# Patient Record
Sex: Male | Born: 1965 | Race: White | Hispanic: No | Marital: Married | Smoking: Current every day smoker
Health system: Southern US, Community
[De-identification: ages and names within clinical notes are randomized; demographics above are authoritative.]

## PROBLEM LIST (undated history)

## (undated) DIAGNOSIS — M549 Dorsalgia, unspecified: Secondary | ICD-10-CM

## (undated) DIAGNOSIS — K648 Other hemorrhoids: Secondary | ICD-10-CM

## (undated) DIAGNOSIS — Z973 Presence of spectacles and contact lenses: Secondary | ICD-10-CM

## (undated) DIAGNOSIS — I493 Ventricular premature depolarization: Secondary | ICD-10-CM

## (undated) DIAGNOSIS — D6859 Other primary thrombophilia: Secondary | ICD-10-CM

## (undated) DIAGNOSIS — D500 Iron deficiency anemia secondary to blood loss (chronic): Secondary | ICD-10-CM

## (undated) DIAGNOSIS — F1021 Alcohol dependence, in remission: Secondary | ICD-10-CM

## (undated) DIAGNOSIS — G629 Polyneuropathy, unspecified: Secondary | ICD-10-CM

## (undated) DIAGNOSIS — E785 Hyperlipidemia, unspecified: Secondary | ICD-10-CM

## (undated) DIAGNOSIS — I499 Cardiac arrhythmia, unspecified: Secondary | ICD-10-CM

## (undated) DIAGNOSIS — F418 Other specified anxiety disorders: Secondary | ICD-10-CM

## (undated) DIAGNOSIS — Z8679 Personal history of other diseases of the circulatory system: Secondary | ICD-10-CM

## (undated) DIAGNOSIS — Z86718 Personal history of other venous thrombosis and embolism: Secondary | ICD-10-CM

## (undated) DIAGNOSIS — Z86010 Personal history of colonic polyps: Secondary | ICD-10-CM

## (undated) DIAGNOSIS — J410 Simple chronic bronchitis: Secondary | ICD-10-CM

## (undated) DIAGNOSIS — G8929 Other chronic pain: Secondary | ICD-10-CM

## (undated) DIAGNOSIS — Z95828 Presence of other vascular implants and grafts: Secondary | ICD-10-CM

## (undated) DIAGNOSIS — M1990 Unspecified osteoarthritis, unspecified site: Secondary | ICD-10-CM

## (undated) DIAGNOSIS — N529 Male erectile dysfunction, unspecified: Secondary | ICD-10-CM

## (undated) DIAGNOSIS — Z860101 Personal history of adenomatous and serrated colon polyps: Secondary | ICD-10-CM

## (undated) DIAGNOSIS — K219 Gastro-esophageal reflux disease without esophagitis: Secondary | ICD-10-CM

## (undated) DIAGNOSIS — F17200 Nicotine dependence, unspecified, uncomplicated: Secondary | ICD-10-CM

## (undated) DIAGNOSIS — I739 Peripheral vascular disease, unspecified: Secondary | ICD-10-CM

## (undated) DIAGNOSIS — R760 Raised antibody titer: Secondary | ICD-10-CM

## (undated) DIAGNOSIS — Z7901 Long term (current) use of anticoagulants: Secondary | ICD-10-CM

## (undated) DIAGNOSIS — K449 Diaphragmatic hernia without obstruction or gangrene: Secondary | ICD-10-CM

## (undated) HISTORY — DX: Raised antibody titer: R76.0

## (undated) HISTORY — DX: Ventricular premature depolarization: I49.3

## (undated) HISTORY — DX: Other primary thrombophilia: D68.59

## (undated) HISTORY — DX: Peripheral vascular disease, unspecified: I73.9

## (undated) HISTORY — DX: Hyperlipidemia, unspecified: E78.5

## (undated) HISTORY — PX: TRANSTHORACIC ECHOCARDIOGRAM: SHX275

## (undated) HISTORY — PX: CARDIOVASCULAR STRESS TEST: SHX262

## (undated) HISTORY — DX: Other specified anxiety disorders: F41.8

## (undated) HISTORY — DX: Other hemorrhoids: K64.8

## (undated) HISTORY — PX: HEMORRHOID SURGERY: SHX153

## (undated) HISTORY — DX: Nicotine dependence, unspecified, uncomplicated: F17.200

---
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
## 1998-10-19 ENCOUNTER — Encounter: Payer: Self-pay | Admitting: *Deleted

## 1998-10-19 ENCOUNTER — Emergency Department (HOSPITAL_COMMUNITY): Admission: EM | Admit: 1998-10-19 | Discharge: 1998-10-19 | Payer: Self-pay | Admitting: *Deleted

## 1999-08-05 ENCOUNTER — Encounter: Payer: Self-pay | Admitting: Emergency Medicine

## 1999-08-05 ENCOUNTER — Inpatient Hospital Stay (HOSPITAL_COMMUNITY): Admission: EM | Admit: 1999-08-05 | Discharge: 1999-08-06 | Payer: Self-pay

## 2001-09-12 ENCOUNTER — Emergency Department (HOSPITAL_COMMUNITY): Admission: EM | Admit: 2001-09-12 | Discharge: 2001-09-12 | Payer: Self-pay

## 2010-06-28 ENCOUNTER — Other Ambulatory Visit: Payer: Self-pay | Admitting: Internal Medicine

## 2010-06-28 DIAGNOSIS — M5412 Radiculopathy, cervical region: Secondary | ICD-10-CM

## 2010-06-29 ENCOUNTER — Ambulatory Visit
Admission: RE | Admit: 2010-06-29 | Discharge: 2010-06-29 | Disposition: A | Discharge status: Home / Self Care | Payer: Managed Care, Other (non HMO) | Source: Ambulatory Visit | Attending: Internal Medicine | Admitting: Internal Medicine

## 2010-06-29 ENCOUNTER — Other Ambulatory Visit: Payer: Self-pay | Admitting: Internal Medicine

## 2010-06-29 DIAGNOSIS — Z77018 Contact with and (suspected) exposure to other hazardous metals: Secondary | ICD-10-CM

## 2010-06-29 DIAGNOSIS — M5412 Radiculopathy, cervical region: Secondary | ICD-10-CM

## 2010-07-15 ENCOUNTER — Encounter (HOSPITAL_COMMUNITY)
Admission: RE | Admit: 2010-07-15 | Discharge: 2010-07-15 | Disposition: A | Discharge status: Home / Self Care | Payer: Managed Care, Other (non HMO) | Source: Ambulatory Visit | Attending: Neurosurgery | Admitting: Neurosurgery

## 2010-07-15 DIAGNOSIS — Z01812 Encounter for preprocedural laboratory examination: Secondary | ICD-10-CM | POA: Insufficient documentation

## 2010-07-15 LAB — CBC
HCT: 39.4 % (ref 39.0–52.0)
Hemoglobin: 13.5 g/dL (ref 13.0–17.0)
MCH: 32 pg (ref 26.0–34.0)
MCHC: 34.3 g/dL (ref 30.0–36.0)
MCV: 93.4 fL (ref 78.0–100.0)
Platelets: 304 10*3/uL (ref 150–400)
RBC: 4.22 MIL/uL (ref 4.22–5.81)
RDW: 13.4 % (ref 11.5–15.5)
WBC: 24.7 10*3/uL — ABNORMAL HIGH (ref 4.0–10.5)

## 2010-07-15 LAB — BASIC METABOLIC PANEL
CO2: 25 mEq/L (ref 19–32)
GFR calc Af Amer: >60 mL/min (ref >60)
GFR calc non Af Amer: >60 mL/min (ref >60)
Glucose, Bld: 83 mg/dL (ref 70–99)
Potassium: 4.1 mEq/L (ref 3.5–5.1)
Sodium: 141 mEq/L (ref 135–145)

## 2010-07-15 LAB — URINALYSIS, ROUTINE W REFLEX MICROSCOPIC
Hgb urine dipstick: NEGATIVE
Protein, ur: NEGATIVE mg/dL
Urobilinogen, UA: 1 mg/dL (ref 0.0–1.0)

## 2010-07-15 LAB — PROTIME-INR: INR: 1.06 (ref 0.00–1.49)

## 2010-07-15 LAB — SURGICAL PCR SCREEN: MRSA, PCR: NEGATIVE

## 2010-07-21 ENCOUNTER — Ambulatory Visit (HOSPITAL_COMMUNITY)
Admission: RE | Admit: 2010-07-21 | Discharge: 2010-07-21 | Disposition: A | Discharge status: Home / Self Care | Payer: Managed Care, Other (non HMO) | Source: Ambulatory Visit | Attending: Neurosurgery | Admitting: Neurosurgery

## 2010-07-21 ENCOUNTER — Observation Stay (HOSPITAL_COMMUNITY): Payer: Managed Care, Other (non HMO)

## 2010-07-21 ENCOUNTER — Observation Stay (HOSPITAL_COMMUNITY)
Admission: RE | Admit: 2010-07-21 | Discharge: 2010-07-21 | Disposition: A | Discharge status: Home / Self Care | Payer: Managed Care, Other (non HMO) | Source: Ambulatory Visit | Attending: Neurosurgery | Admitting: Neurosurgery

## 2010-07-21 ENCOUNTER — Other Ambulatory Visit (HOSPITAL_COMMUNITY): Payer: Self-pay | Admitting: Neurosurgery

## 2010-07-21 DIAGNOSIS — F17200 Nicotine dependence, unspecified, uncomplicated: Secondary | ICD-10-CM | POA: Insufficient documentation

## 2010-07-21 DIAGNOSIS — D72829 Elevated white blood cell count, unspecified: Secondary | ICD-10-CM

## 2010-07-21 DIAGNOSIS — M5020 Other cervical disc displacement, unspecified cervical region: Principal | ICD-10-CM | POA: Insufficient documentation

## 2010-07-21 DIAGNOSIS — K219 Gastro-esophageal reflux disease without esophagitis: Secondary | ICD-10-CM | POA: Insufficient documentation

## 2010-07-21 DIAGNOSIS — F411 Generalized anxiety disorder: Secondary | ICD-10-CM | POA: Insufficient documentation

## 2010-07-21 HISTORY — PX: ANTERIOR CERVICAL DECOMP/DISCECTOMY FUSION: SHX1161

## 2010-07-21 LAB — CBC
Hemoglobin: 14.3 g/dL (ref 13.0–17.0)
MCH: 32 pg (ref 26.0–34.0)
Platelets: 300 10*3/uL (ref 150–400)
RBC: 4.47 MIL/uL (ref 4.22–5.81)
WBC: 15.4 10*3/uL — ABNORMAL HIGH (ref 4.0–10.5)

## 2010-07-21 LAB — DIFFERENTIAL
Basophils Relative: 1 % (ref 0–1)
Monocytes Relative: 5 % (ref 3–12)
Neutro Abs: 10.2 10*3/uL — ABNORMAL HIGH (ref 1.7–7.7)
Neutrophils Relative %: 66 % (ref 43–77)

## 2010-07-29 NOTE — Op Note (Signed)
[name], [name]             ACCOUNT NO.:  [id number]  MEDICAL RECORD NO.:  [id number]           PATIENT TYPE:  O  LOCATION:  XRAY                         FACILITY:  [facility]  PHYSICIAN:  Clydene Fake, M.D.  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  PROCEDURE:  Anterior cervical decompression, diskectomy and fusion at C5- 6, C6-7 with LifeNet allograft bone, Trestle anterior cervical plate.  SURGEON:  Clydene Fake, MD  ASSISTANT:  Hewitt Shorts, MD  ASSISTANT:  General endotracheal tube anesthesia.  ESTIMATED BLOOD LOSS:  Minimal.  BLOOD GIVEN:  None.  DRAINS:  None.  COMPLICATIONS:  None.  REASON FOR PROCEDURE:  The patient is a 45 year old gentleman who has had some left-sided pain, numbness and tingling for a few months that started coming down.  It was daily sort of a neck pain and then he started having more right-sided pain, radiating down his right hand and to his index finger.  He was found to have 5-/5 strength in right triceps.  An MRI was done showing left-sided disk herniation at C5-6 and a right-sided disk herniation at C6-7.  The patient has not had improved with NSAIDS and at this time was through with home exercises and we decided to proceed with surgery for two-level ACF.  PROCEDURE IN DETAIL:  The patient was brought to the operating room and general anesthesia was induced.  The patient was placed in a 10-pound Halter traction, prepped and draped in the sterile fashion.  Site of incision injected with 10 mL of 1% lidocaine with epinephrine.  Incision was then made from the midline to the anterior border of the sternocleidomastoid muscle on the left side.  The neck incision  was taken down to the platysma and hemostasis was obtained with Bovie cauterization.  The platysma muscle was incised with a Bovie and blunt dissection taken through the anterior cervical fascia.  The anterior cervical spine appeared.  Needles were placed at 2 interspaces and x- rays obtained showing the needles were at the 5-6 and 6-7 interspaces. Disk spaces were incised with 15 blade and partial diskectomy done at the base with pituitary rongeurs as the needles removed.  Longus colli muscles were reflected laterally with the Bovie and self-retaining retractors were placed.  Anterior osteophytes were removed with the Kerrison punches and distraction pins placed in the C5 and C7 interspaces distracted.  Microscope was brought in for microdissection at this point and starting at the 5-6 level.  Diskectomy continued with curettes and pituitary rongeurs, 0.2-mm Kerrison punches was then used to remove the posterior disk osteophyte ligament decompressing the central canal and performing bilateral foraminotomies decompressing the nerve roots.  Left-sided disk herniation was seen at this level and this was removed.  When we had finished, we had good decompression of the central canal of the nerve roots.  High-speed drill was used to remove cartilaginous end plate.  We  then measured height of disk space to be 6- mm and a 6-mm LifeNet allograft bone was tapped into place.  The first we had hemostasis with Gelfoam thrombin and this was irrigated out. Attention was taken to the 6-7 level.  Again, diskectomy done with pituitary rongeurs and curettes and then 1 or 2 Kerrison punches were used to remove the posterior disk osteophyte ligament decompressing the central canal and bilateral nerve roots.  There was disk herniation to the right side seen here and this was removed.  We had the good decompression of the nerve roots.  High-speed drill was used to remove cartilaginous endplate.  We then  measured height of disk space to be 6- mm and a 6-mm LifeNet allograft bone was tapped into place.  We irrigated with antibiotic solution.  Distraction pins were removed. Hemostasis was obtained with Gelfoam and thrombin.  Weight was removed from traction.  The bones were in good firm position at the 2 levels in a Trestle.  Anterior cervical plate was placed over the anterior cervical spine and 2 screws placed in the C5-6, C6-7.  These were tightened down.  Lateral C-spine x-ray was done showing good position plates with bone plugs at the 5-6, 6-7 level.  Retractors were removed. Hemostasis was obtained with Gelfoam and thrombin.  Gelfoam was removed and we irrigated with antibiotic solution.  We had very good hemostasis and the platysma closed with 3-0 Vicryl interrupted sutures. Subcutaneous tissue closed with same.  Skin closed with benzoin and Steri-Strips.  Dressing was placed.  The patient was placed in a soft cervical collar, awoken from anesthesia, and transferred to recovery room in stable condition.  Thank you very much.          ______________________________ Clydene Fake, M.D.     JRH/MEDQ  D:  07/21/2010  T:  07/22/2010  Job:  [id number]  Electronically Signed by Colon Branch M.D. on 07/29/2010 05:06:36 PM

## 2012-07-02 ENCOUNTER — Telehealth: Payer: Self-pay | Admitting: Radiology

## 2012-07-02 NOTE — Telephone Encounter (Signed)
[name] is still experiencing chronic neck pain, it has been 2 years since his neck fusions. Some of the numbness/tingling in his arms has also returned. He also has low back pain associated with a bulging disc that acts ups occasionally. I think his mid back pain is related to an overall bad back. He has had several rounds of PT and it helps with his muscle tightness but he has never stopped complaining about the interior neck pain. I know he is not interested in pain med's but wonder what else we can do. Would you recommend chiropractic care or any other ideas? Please let his wife [name] know, [phone].

## 2012-07-03 NOTE — Telephone Encounter (Signed)
I will take care of this

## 2013-10-13 ENCOUNTER — Ambulatory Visit (INDEPENDENT_AMBULATORY_CARE_PROVIDER_SITE_OTHER): Payer: Managed Care, Other (non HMO) | Admitting: Internal Medicine

## 2013-10-13 VITALS — BP 114/72 | HR 77 | Temp 97.7°F | Resp 18 | Ht 70.0 in | Wt 178.0 lb

## 2013-10-13 DIAGNOSIS — F329 Major depressive disorder, single episode, unspecified: Secondary | ICD-10-CM

## 2013-10-13 DIAGNOSIS — G4700 Insomnia, unspecified: Secondary | ICD-10-CM

## 2013-10-13 DIAGNOSIS — F341 Dysthymic disorder: Secondary | ICD-10-CM

## 2013-10-13 MED ORDER — ALPRAZOLAM 0.5 MG PO TABS: 0.5000 mg | ORAL_TABLET | Freq: Three times a day (TID) | ORAL | Status: DC | PRN

## 2013-10-13 MED ORDER — FLUOXETINE HCL 20 MG PO TABS: 20.0000 mg | ORAL_TABLET | Freq: Every day | ORAL | Status: DC

## 2013-10-13 NOTE — Progress Notes (Signed)
Followup visit He has done well for a few years without any medications for anxiety or depression. In recent months he has suffered the loss of his father and as a result of this has begun having trouble sleeping and mild mood symptoms of depression during the daytime. 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 His low back and mid back have responded to chiropractic while he still has some neck pain  He has no current health problems and is on no medications  Exam BP 114/72  Pulse 77  Temp(Src) 97.7 F (36.5 C) (Oral)  Resp 18  Ht 5\' 10"  (1.778 m)  Wt 178 lb (80.74 kg)  BMI 25.54 kg/m2  SpO2 100% He is in no acute distress His mood is stable and his affect is appropriate given the situation Thought content normal/judgment intact  Impression #1 reactive anxiety and depression  Plan Restart Prozac 20 mg and Xanax 0.5 mg 3 times a day when necessary Followup as needed for dose adjustment

## 2014-01-07 ENCOUNTER — Ambulatory Visit (INDEPENDENT_AMBULATORY_CARE_PROVIDER_SITE_OTHER): Payer: Managed Care, Other (non HMO) | Admitting: Family Medicine

## 2014-01-07 VITALS — BP 118/70 | HR 93 | Temp 98.3°F | Resp 18 | Ht 70.0 in | Wt 165.0 lb

## 2014-01-07 DIAGNOSIS — K645 Perianal venous thrombosis: Secondary | ICD-10-CM

## 2014-01-07 DIAGNOSIS — K6289 Other specified diseases of anus and rectum: Secondary | ICD-10-CM

## 2014-01-07 MED ORDER — LIDOCAINE-PRILOCAINE 2.5-2.5 % EX CREA: 1.0000 "application " | TOPICAL_CREAM | CUTANEOUS | Status: DC | PRN

## 2014-01-07 MED ORDER — DILTIAZEM GEL 2 %: 1.0000 "application " | Freq: Two times a day (BID) | CUTANEOUS | Status: DC | PRN

## 2014-01-07 NOTE — Progress Notes (Signed)
Chief Complaint:  Chief Complaint  Patient presents with  . Hemorrhoids    x1 week     HPI: [name] is a 48 y.o. male who is here for  1 week history of hemorrhoids, has had straining and also moving furniture around the hosue due to remodel, he has had hemorrhoids in the past ( interna and external ) , he has had bleeding and clots , he has internal  Hemorrhoid sxs 1-2 x every 2 months, this is the first extrenal hemorrhoid in a awhile, No fever or chills. Very uncomforatble, he has pain, sitting on ice pack, has tried ibuprofena nd also sitz baths TID-QID.   History reviewed. No pertinent past medical history. Past Surgical History  Procedure Laterality Date  . Knee surgery    . Neck surger    . Neck surgery     History   Social History  . Marital Status: Married    Spouse Name: N/A    Number of Children: N/A  . Years of Education: N/A   Social History Main Topics  . Smoking status: Current Every Day Smoker  . Smokeless tobacco: None  . Alcohol Use: None  . Drug Use: None  . Sexual Activity: None   Other Topics Concern  . None   Social History Narrative  . None   Family History  Problem Relation Age of Onset  . Cancer Father    No Known Allergies Prior to Admission medications   Medication Sig Start Date End Date Taking? Authorizing Provider  ALPRAZolam Duanne Moron) 0.5 MG tablet Take 1 tablet (0.5 mg total) by mouth 3 (three) times daily as needed for anxiety. 10/13/13  Yes Leandrew Koyanagi, MD  FLUoxetine (PROZAC) 20 MG tablet Take 1 tablet (20 mg total) by mouth daily. 10/13/13  Yes Leandrew Koyanagi, MD     ROS: The patient denies fevers, chills, night sweats, unintentional weight loss, chest pain, palpitations, wheezing, dyspnea on exertion, nausea, vomiting, abdominal pain, dysuria, hematuria, melena, numbness, weakness, or tingling.   All other systems have been reviewed and were otherwise negative with the exception of those mentioned in the  HPI and as above.    PHYSICAL EXAM: Filed Vitals:   01/07/14 1751  BP: 118/70  Pulse: 93  Temp: 98.3 F (36.8 C)  Resp: 18   Filed Vitals:   01/07/14 1751  Height: 5\' 10"  (1.778 m)  Weight: 165 lb (74.844 kg)   Body mass index is 23.68 kg/(m^2).  General: Alert, no acute distress HEENT:  Normocephalic, atraumatic, oropharynx patent. EOMI, PERRLA Cardiovascular:  Regular rate and rhythm, no rubs murmurs or gallops.  Radial pulse intact. No pedal edema.  Respiratory: Clear to auscultation bilaterally.  No wheezes, rales, or rhonchi.  No cyanosis, no use of accessory musculature GI: No organomegaly, abdomen is soft and non-tender, positive bowel sounds.  No masses. Skin: No rashes. Neurologic: Facial musculature symmetric. Psychiatric: Patient is appropriate throughout our interaction. Lymphatic: No cervical lymphadenopathy Musculoskeletal: Gait intact. GU + external hemorrhoid   LABS: Results for orders placed during the hospital encounter of 07/21/10  DIFFERENTIAL      Result Value Ref Range   Neutrophils Relative % 66  43 - 77 %   Neutro Abs 10.2 (*) 1.7 - 7.7 K/uL   Lymphocytes Relative 23  12 - 46 %   Lymphs Abs 3.6  0.7 - 4.0 K/uL   Monocytes Relative 5  3 - 12 %   Monocytes Absolute  0.7  0.1 - 1.0 K/uL   Eosinophils Relative 5  0 - 5 %   Eosinophils Absolute 0.8 (*) 0.0 - 0.7 K/uL   Basophils Relative 1  0 - 1 %   Basophils Absolute 0.1  0.0 - 0.1 K/uL  CBC      Result Value Ref Range   WBC 15.4 (*) 4.0 - 10.5 K/uL   RBC 4.47  4.22 - 5.81 MIL/uL   Hemoglobin 14.3  13.0 - 17.0 g/dL   HCT 42.3  39.0 - 52.0 %   MCV 94.6  78.0 - 100.0 fL   MCH 32.0  26.0 - 34.0 pg   MCHC 33.8  30.0 - 36.0 g/dL   RDW 13.9  11.5 - 15.5 %   Platelets 300  150 - 400 K/uL     EKG/XRAY:   Primary read interpreted by [facility]. Marin Comment at [facility].   ASSESSMENT/PLAN: Encounter Diagnoses  Name Primary?  . Thrombosed external hemorrhoids Yes  . Rectal pain    Rx Diltiazem ge Rx Emla  cream C/w sizt bath, fluids, high fiber diet Hemorrhoid precautiosn given F/u prn  Gross sideeffects, risk and benefits, and alternatives of medications d/w patient. Patient is aware that all medications have potential sideeffects and we are unable to predict every sideeffect or drug-drug interaction that may occur.  [name], [place], DO 01/07/2014 7:19 PM

## 2014-01-07 NOTE — Progress Notes (Signed)
Verbal Consent Obtained. Local anesthesia with 2 cc of 2% lidocaine with epinephrine.  SP&D. Ellipse cut using scissors and pick-ups and multiple small to moderate sized clots evacuated.  Cleansed. Gauze placed.  Continue sitz baths and Tuck's pads at home.

## 2014-01-07 NOTE — Patient Instructions (Signed)

## 2014-05-09 ENCOUNTER — Other Ambulatory Visit: Payer: Self-pay | Admitting: Internal Medicine

## 2014-06-23 ENCOUNTER — Ambulatory Visit (INDEPENDENT_AMBULATORY_CARE_PROVIDER_SITE_OTHER): Payer: Managed Care, Other (non HMO) | Admitting: Internal Medicine

## 2014-06-23 VITALS — BP 120/58 | HR 89 | Temp 98.3°F | Resp 18 | Ht 69.5 in | Wt 170.0 lb

## 2014-06-23 DIAGNOSIS — F17200 Nicotine dependence, unspecified, uncomplicated: Secondary | ICD-10-CM

## 2014-06-23 DIAGNOSIS — Z720 Tobacco use: Secondary | ICD-10-CM

## 2014-06-23 DIAGNOSIS — F341 Dysthymic disorder: Secondary | ICD-10-CM

## 2014-06-23 DIAGNOSIS — F329 Major depressive disorder, single episode, unspecified: Secondary | ICD-10-CM

## 2014-06-23 MED ORDER — FLUOXETINE HCL 20 MG PO TABS: 20.0000 mg | ORAL_TABLET | Freq: Every day | ORAL | Status: DC

## 2014-06-23 MED ORDER — ALPRAZOLAM 0.5 MG PO TABS: 0.5000 mg | ORAL_TABLET | Freq: Three times a day (TID) | ORAL | Status: DC | PRN

## 2014-06-23 NOTE — Patient Instructions (Signed)
Dr Barbaraann Barthel

## 2014-06-23 NOTE — Progress Notes (Signed)
Subjective:  This chart was scribed for Tami Lin, MD by Molli Posey, Medical scribe. This patient was seen in ROOM 10 and the patient's care was started 4:29 PM.   Patient ID: [name], male    DOB: April 03, 1966, 49 y.o.   MRN: [id number]   Chief Complaint  Patient presents with  . Medication Refill    xanax and prozac   HPI HPI Comments: [name] is a 49 y.o. male who presents to [facility] for a medication refill for his xanax and prozac prescriptions. He states that he has been off of his medications  recently because he ran out. Pt states that he feels much better with his medications. He reports that he can definitely tell a difference in his stress levels and sleep quality recently compared to when he takes his medications. Anxiety w/ no sleep on worknights related to job stressors and espec one employee. Depr from loss of father then other relatives over last few yrs.  Pt complains of ongoing minimal neck pain and says he has tried ibuprofen with no relief to his symptoms. He says it has started aching a little more recently and he may resume chiropractor soon. Chir helped thoracic pain to resolve. Cervical s/p surgery Dr Luiz Ochoa 2012  No past medical history on file.   There are no active problems to display for this patient.  Current Outpatient Prescriptions on File Prior to Visit----off all  Medication Sig Dispense Refill  . ALPRAZolam (XANAX) 0.5 MG tablet Take 1 tablet (0.5 mg total) by mouth 3 (three) times daily as needed for anxiety. 90 tablet 5  . diltiazem 2 % GEL Apply 1 application topically 2 (two) times daily as needed. 30 g 0  . FLUoxetine (PROZAC) 20 MG tablet Take 1 tablet (20 mg total) by mouth daily. PATIENT NEEDS OFFICE VISIT FOR ADDITIONAL REFILLS 30 tablet 0  . lidocaine-prilocaine (EMLA) cream Apply 1 application topically as needed. 30 g 0   No current facility-administered medications on file prior to visit.   No Known Allergies Past  Surgical History  Procedure Laterality Date  . Knee surgery    . Neck surger    . Neck surgery     Family History  Problem Relation Age of Onset  . Cancer Father    Still smoking w/ no intent to stop/same w/wife  Review of Systems  Constitutional: Negative for activity change, appetite change, fatigue and unexpected weight change.  Eyes: Negative for visual disturbance.  Respiratory: Negative for cough and shortness of breath.   Cardiovascular: Negative for chest pain and palpitations.  Neurological: Negative for headaches.  Psychiatric/Behavioral: Negative for confusion and decreased concentration.       Objective:   Physical Exam  Constitutional: He is oriented to person, place, and time. He appears well-developed and well-nourished. No distress.  HENT:  Head: Normocephalic and atraumatic.  Eyes: Conjunctivae and EOM are normal. Pupils are equal, round, and reactive to light.  Neck: Neck supple. No thyromegaly present.  Cardiovascular: Normal rate.   Pulmonary/Chest: Effort normal.  Neurological: He is alert and oriented to person, place, and time. No cranial nerve deficit.  Psychiatric: He has a normal mood and affect. His behavior is normal.  Nursing note and vitals reviewed.  BP 120/58 mmHg  Pulse 89  Temp(Src) 98.3 F (36.8 C)  Resp 18  Ht 5' 9.5" (1.765 m)  Wt 170 lb (77.111 kg)  BMI 24.75 kg/m2  SpO2 99%     Assessment & Plan:  Reactive depression  Smoker  Meds ordered this encounter  Medications  . FLUoxetine (PROZAC) 20 MG tablet    Sig: Take 1 tablet (20 mg total) by mouth daily.    Dispense:  90 tablet    Refill:  3  . ALPRAZolam (XANAX) 0.5 MG tablet    Sig: Take 1 tablet (0.5 mg total) by mouth 3 (three) times daily as needed for anxiety.    Dispense:  90 tablet    Refill:  5   Advised smoking cessation Routine physical exam with health maintenance issues advised as well

## 2014-06-24 ENCOUNTER — Encounter: Payer: Self-pay | Admitting: Internal Medicine

## 2014-06-24 DIAGNOSIS — F418 Other specified anxiety disorders: Secondary | ICD-10-CM | POA: Insufficient documentation

## 2014-06-24 DIAGNOSIS — F17200 Nicotine dependence, unspecified, uncomplicated: Secondary | ICD-10-CM | POA: Insufficient documentation

## 2014-06-24 HISTORY — DX: Other specified anxiety disorders: F41.8

## 2014-12-23 ENCOUNTER — Other Ambulatory Visit: Payer: Self-pay | Admitting: Internal Medicine

## 2015-03-08 ENCOUNTER — Ambulatory Visit (INDEPENDENT_AMBULATORY_CARE_PROVIDER_SITE_OTHER): Payer: Managed Care, Other (non HMO) | Admitting: Internal Medicine

## 2015-03-08 VITALS — BP 140/90 | HR 73 | Temp 98.1°F | Resp 18 | Ht 69.5 in | Wt 175.0 lb

## 2015-03-08 DIAGNOSIS — M545 Low back pain, unspecified: Secondary | ICD-10-CM

## 2015-03-08 DIAGNOSIS — F4323 Adjustment disorder with mixed anxiety and depressed mood: Secondary | ICD-10-CM

## 2015-03-08 MED ORDER — ALPRAZOLAM 0.5 MG PO TABS: 0.5000 mg | ORAL_TABLET | Freq: Three times a day (TID) | ORAL | Status: DC | PRN

## 2015-03-08 MED ORDER — CYCLOBENZAPRINE HCL 10 MG PO TABS: 10.0000 mg | ORAL_TABLET | Freq: Every day | ORAL | Status: DC

## 2015-03-08 MED ORDER — PREDNISONE 20 MG PO TABS: ORAL_TABLET | ORAL | Status: DC

## 2015-03-08 NOTE — Progress Notes (Signed)
Subjective:  This chart was scribed for Tami Lin, MD by Thea Alken, ED Scribe. This patient was seen in room 11 and the patient's care was started at 4:08 PM.  Patient ID: [name], male    DOB: 1965-12-12, 49 y.o.   MRN: [id number]  HPI   Chief Complaint  Patient presents with  . Medication Refill    Xanax, prozac  . Back Pain    Lower/ onset 1 week ago this time/ Uses Ibuprofin for pain   HPI Comments:  [name] is a 49 y.o. male who presents to the [facility] for back pain as well as medication refill. Pt states low back pain began 1 week ago. He denies heavy lifting but did climb a tree a couple days ago, exacerbating the pain. He usually takes 4 advil with improvement to pain and swelling but this treatment did not help with current pain. He has pain with getting in and out of his car, sleeping at night, shifting his weight and twisting his body. He's had his mid back adjusted in the past.   Pt is also needing refills of his xanax. He finds this medication helpful with work stress, home stress and sleeping at night. States he recently kicked his son out of the house due to heroin use and has not talked to him in months. Also stressed about his mother health. Pt has been off prozac for 3 months/was no longer as depr--more anxious these days and finds xanax works better.   No past medical history on file. No Known Allergies Prior to Admission medications   Medication Sig Start Date End Date Taking? Authorizing Provider  ALPRAZolam Duanne Moron) 0.5 MG tablet TAKE 1 TABLET BY MOUTH 3 TIMES A DAY AS NEEDED Patient not taking: Reported on 03/08/2015 12/24/14   Leandrew Koyanagi, MD  FLUoxetine (PROZAC) 20 MG tablet Take 1 tablet (20 mg total) by mouth daily. Patient not taking: Reported on 03/08/2015 06/23/14   Leandrew Koyanagi, MD   Review of Systems  Musculoskeletal: Positive for myalgias and back pain. Negative for gait problem.  Neurological:  Negative for weakness and numbness.   Objective:   Physical Exam  Constitutional: He is oriented to person, place, and time. He appears well-developed and well-nourished. No distress.  HENT:  Head: Normocephalic and atraumatic.  Eyes: Conjunctivae and EOM are normal.  Neck: Neck supple.  Cardiovascular: Normal rate.   Pulmonary/Chest: Effort normal.  Musculoskeletal: Normal range of motion.  Tender midline L4/5 but neg SLR No sens/mot losses DTRs symm  Neurological: He is alert and oriented to person, place, and time.  Skin: Skin is warm and dry.  Psychiatric: He has a normal mood and affect. His behavior is normal.  Nursing note and vitals reviewed.  Filed Vitals:   03/08/15 1539 03/08/15 1543  BP: 160/80---anxious tonight 140/90  Pulse: 73   Temp: 98.1 F (36.7 C)   TempSrc: Oral   Resp: 18   Height: 5' 9.5" (1.765 m)   Weight: 175 lb (79.379 kg)   SpO2: 99%    Assessment & Plan:   1. Midline low back pain without sciatica   2. Adjustment disorder with mixed anxiety and depressed mood      Meds ordered this encounter  Medications  . ALPRAZolam (XANAX) 0.5 MG tablet    Sig: Take 1 tablet (0.5 mg total) by mouth 3 (three) times daily as needed.    Dispense:  90 tablet  Refill:  5    This request is for a new prescription for a controlled substance as required by Federal/State law.  . predniSONE (DELTASONE) 20 MG tablet    Sig: 3/3/2/2/1/1 single daily dose for 6 days    Dispense:  12 tablet    Refill:  0  . cyclobenzaprine (FLEXERIL) 10 MG tablet    Sig: Take 1 tablet (10 mg total) by mouth at bedtime.    Dispense:  15 tablet    Refill:  0    By signing my name below, I, Raven Small, attest that this documentation has been prepared under the direction and in the presence of Tami Lin, MD.  Electronically Signed: Thea Alken, ED Scribe. 03/08/2015. 4:30 PM.   I have completed the patient encounter in its entirety as documented by the scribe, with  editing by me where necessary. Tipton Ballow P. Laney Pastor, M.D.

## 2015-03-22 ENCOUNTER — Telehealth: Payer: Self-pay

## 2015-03-22 DIAGNOSIS — M25559 Pain in unspecified hip: Secondary | ICD-10-CM

## 2015-03-22 DIAGNOSIS — M545 Low back pain, unspecified: Secondary | ICD-10-CM

## 2015-03-22 NOTE — Telephone Encounter (Signed)
So next step is ortho eval---guil ortho ref entered

## 2015-03-22 NOTE — Telephone Encounter (Signed)
DOOLITTLE - Pt completed his steroids and is still occasionally taking the muscle relaxer for LBP. Had some relief while taking the steroids but now feels the pain is back to where it was, also concerned with bi lateral hip pain. Pain is limiting movement. Possible referral to ortho?  Contact patient wife Almyra Free at [phone]

## 2015-04-01 ENCOUNTER — Other Ambulatory Visit: Payer: Self-pay | Admitting: Orthopaedic Surgery

## 2015-04-01 DIAGNOSIS — M542 Cervicalgia: Secondary | ICD-10-CM

## 2015-04-19 ENCOUNTER — Encounter: Payer: Self-pay | Admitting: Internal Medicine

## 2015-05-30 ENCOUNTER — Other Ambulatory Visit: Payer: Managed Care, Other (non HMO)

## 2015-06-05 ENCOUNTER — Inpatient Hospital Stay: Admission: RE | Admit: 2015-06-05 | Payer: Managed Care, Other (non HMO) | Source: Ambulatory Visit

## 2015-06-26 ENCOUNTER — Other Ambulatory Visit: Payer: Self-pay | Admitting: Internal Medicine

## 2015-07-02 ENCOUNTER — Other Ambulatory Visit: Payer: Self-pay | Admitting: Orthopaedic Surgery

## 2015-07-02 ENCOUNTER — Other Ambulatory Visit (HOSPITAL_COMMUNITY): Payer: Self-pay | Admitting: Orthopaedic Surgery

## 2015-07-02 DIAGNOSIS — M542 Cervicalgia: Secondary | ICD-10-CM

## 2015-07-09 MED FILL — ALPRAZolam 0.5 MG TABS: 0.5 | 30 days supply | Qty: 90 | Fill #0

## 2015-07-10 ENCOUNTER — Other Ambulatory Visit: Payer: Managed Care, Other (non HMO)

## 2015-07-10 ENCOUNTER — Ambulatory Visit
Admission: RE | Admit: 2015-07-10 | Discharge: 2015-07-10 | Disposition: A | Discharge status: Home / Self Care | Payer: 59 | Source: Ambulatory Visit | Attending: Orthopaedic Surgery | Admitting: Orthopaedic Surgery

## 2015-07-10 DIAGNOSIS — M542 Cervicalgia: Secondary | ICD-10-CM

## 2015-07-10 DIAGNOSIS — M50221 Other cervical disc displacement at C4-C5 level: Secondary | ICD-10-CM | POA: Diagnosis not present

## 2015-07-13 DIAGNOSIS — M545 Low back pain: Secondary | ICD-10-CM | POA: Diagnosis not present

## 2015-07-21 DIAGNOSIS — M5412 Radiculopathy, cervical region: Secondary | ICD-10-CM | POA: Diagnosis not present

## 2015-07-21 DIAGNOSIS — M961 Postlaminectomy syndrome, not elsewhere classified: Secondary | ICD-10-CM | POA: Diagnosis not present

## 2015-08-02 DIAGNOSIS — M5412 Radiculopathy, cervical region: Secondary | ICD-10-CM | POA: Diagnosis not present

## 2015-08-02 DIAGNOSIS — M961 Postlaminectomy syndrome, not elsewhere classified: Secondary | ICD-10-CM | POA: Diagnosis not present

## 2015-08-06 MED FILL — ALPRAZolam 0.5 MG TABS: 0.5 | 30 days supply | Qty: 90 | Fill #1

## 2015-08-11 DIAGNOSIS — F1020 Alcohol dependence, uncomplicated: Secondary | ICD-10-CM | POA: Diagnosis not present

## 2015-08-17 DIAGNOSIS — F1020 Alcohol dependence, uncomplicated: Secondary | ICD-10-CM | POA: Diagnosis not present

## 2015-08-18 DIAGNOSIS — F1020 Alcohol dependence, uncomplicated: Secondary | ICD-10-CM | POA: Diagnosis not present

## 2015-08-19 DIAGNOSIS — F1020 Alcohol dependence, uncomplicated: Secondary | ICD-10-CM | POA: Diagnosis not present

## 2015-08-20 DIAGNOSIS — F1020 Alcohol dependence, uncomplicated: Secondary | ICD-10-CM | POA: Diagnosis not present

## 2015-08-21 DIAGNOSIS — F1020 Alcohol dependence, uncomplicated: Secondary | ICD-10-CM | POA: Diagnosis not present

## 2015-08-22 DIAGNOSIS — F1020 Alcohol dependence, uncomplicated: Secondary | ICD-10-CM | POA: Diagnosis not present

## 2015-08-23 DIAGNOSIS — F1020 Alcohol dependence, uncomplicated: Secondary | ICD-10-CM | POA: Diagnosis not present

## 2015-08-24 DIAGNOSIS — F1020 Alcohol dependence, uncomplicated: Secondary | ICD-10-CM | POA: Diagnosis not present

## 2015-08-25 DIAGNOSIS — F1020 Alcohol dependence, uncomplicated: Secondary | ICD-10-CM | POA: Diagnosis not present

## 2015-08-26 DIAGNOSIS — F1020 Alcohol dependence, uncomplicated: Secondary | ICD-10-CM | POA: Diagnosis not present

## 2015-08-27 DIAGNOSIS — F1020 Alcohol dependence, uncomplicated: Secondary | ICD-10-CM | POA: Diagnosis not present

## 2015-08-28 DIAGNOSIS — F1020 Alcohol dependence, uncomplicated: Secondary | ICD-10-CM | POA: Diagnosis not present

## 2015-08-29 DIAGNOSIS — F1020 Alcohol dependence, uncomplicated: Secondary | ICD-10-CM | POA: Diagnosis not present

## 2015-08-30 DIAGNOSIS — F1020 Alcohol dependence, uncomplicated: Secondary | ICD-10-CM | POA: Diagnosis not present

## 2015-08-31 DIAGNOSIS — F1020 Alcohol dependence, uncomplicated: Secondary | ICD-10-CM | POA: Diagnosis not present

## 2015-09-01 DIAGNOSIS — F1020 Alcohol dependence, uncomplicated: Secondary | ICD-10-CM | POA: Diagnosis not present

## 2015-09-02 DIAGNOSIS — F1020 Alcohol dependence, uncomplicated: Secondary | ICD-10-CM | POA: Diagnosis not present

## 2015-09-06 MED FILL — ACAMPROSATE CALC DR 333 MG: 333 | 30 days supply | Qty: 180 | Fill #0

## 2015-12-29 ENCOUNTER — Ambulatory Visit (INDEPENDENT_AMBULATORY_CARE_PROVIDER_SITE_OTHER)
Admission: RE | Admit: 2015-12-29 | Discharge: 2015-12-29 | Disposition: A | Discharge status: Home / Self Care | Payer: 59 | Source: Ambulatory Visit | Attending: Family Medicine | Admitting: Family Medicine

## 2015-12-29 ENCOUNTER — Encounter: Payer: Self-pay | Admitting: Family Medicine

## 2015-12-29 ENCOUNTER — Ambulatory Visit (INDEPENDENT_AMBULATORY_CARE_PROVIDER_SITE_OTHER): Payer: 59 | Admitting: Family Medicine

## 2015-12-29 VITALS — BP 140/78 | HR 86

## 2015-12-29 DIAGNOSIS — M2170 Unequal limb length (acquired), unspecified site: Secondary | ICD-10-CM | POA: Diagnosis not present

## 2015-12-29 DIAGNOSIS — G5701 Lesion of sciatic nerve, right lower limb: Secondary | ICD-10-CM

## 2015-12-29 DIAGNOSIS — M47816 Spondylosis without myelopathy or radiculopathy, lumbar region: Secondary | ICD-10-CM | POA: Diagnosis not present

## 2015-12-29 DIAGNOSIS — M5416 Radiculopathy, lumbar region: Secondary | ICD-10-CM | POA: Diagnosis not present

## 2015-12-29 MED ORDER — GABAPENTIN 100 MG PO CAPS: 200.0000 mg | ORAL_CAPSULE | Freq: Every day | ORAL | 3 refills | Status: DC

## 2015-12-29 MED ORDER — PREDNISONE 50 MG PO TABS: 50.0000 mg | ORAL_TABLET | Freq: Every day | ORAL | 0 refills | Status: DC

## 2015-12-29 NOTE — Progress Notes (Signed)
[name] [place] [place], [place] [address] Phone: [phone] Subjective:    I'm seeing this patient by the request  of:    CC: low back pain with radiation down right leg  [id number]  [name] is a 50 y.o. male coming in with complaint of low back pain. Patient states that this is associated with walking. Seems worse with walking and does have radiation and numbness going down the right leg. Patient denies any weakness. Concerned though because he has had similar presentation previously with his neck. Patient past medical history significant for a cervical ACDF. Patient states that this is going on for months likely. Seems be worsening though. Starting to affect daily activities. He is also waking him up at night. Does respond minorly to over-the-counter anti-inflammatories. States that the radicular symptoms seems to be mostly only with walking.     No past medical history on file. Past Surgical History:  Procedure Laterality Date  . KNEE SURGERY    . neck surger    . NECK SURGERY     Social History   Social History  . Marital status: Married    Spouse name: N/A  . Number of children: N/A  . Years of education: N/A   Social History Main Topics  . Smoking status: Current Every Day Smoker  . Smokeless tobacco: None  . Alcohol use None  . Drug use: Unknown  . Sexual activity: Not Asked   Other Topics Concern  . None   Social History Narrative  . None   No Known Allergies Family History  Problem Relation Age of Onset  . Cancer Father     Past medical history, social, surgical and family history all reviewed in electronic medical record.  No pertanent information unless stated regarding to the chief complaint.   Review of Systems: No headache, visual changes, nausea, vomiting, diarrhea, constipation, dizziness, abdominal pain, skin rash, fevers, chills, night sweats, weight loss, swollen lymph nodes, body aches, joint  swelling, chest pain, shortness of breath, mood changes.   Objective  Blood pressure 140/78, pulse 86, SpO2 99 %.  General: No apparent distress alert and oriented x3 mood and affect normal, dressed appropriately.  HEENT: Pupils equal, extraocular movements intact  Respiratory: Patient's speak in full sentences and does not appear short of breath  Cardiovascular: No lower extremity edema, non tender, no erythema  Skin: Warm dry intact with no signs of infection or rash on extremities or on axial skeleton.  Abdomen: Soft nontender  Neuro: Cranial nerves II through XII are intact, neurovascularly intact in all extremities with 2+ DTRs and 2+ pulses.  Lymph: No lymphadenopathy of posterior or anterior cervical chain or axillae bilaterally.  Gait normal with good balance and coordination.  MSK:  Non tender with full range of motion and good stability and symmetric strength and tone of shoulders, elbows, wrist, hip, knee and ankles bilaterally.  Back Exam:  Inspection: Unremarkable  Motion: Flexion 35 deg, Extension 20 deg, Side Bending to 45 deg bilaterally,  Rotation to 35 deg bilaterally  SLR laying: Negative  XSLR laying: Negative  Palpable tenderness: tenderness to palpation in the L4-L5 paraspinal musculature as well as the sacroiliac joint on the right. FABER: positive bilaterally with pain and palpation of the right piriformis Sensory change: Gross sensation intact to all lumbar and sacral dermatomes.  Reflexes: 2+ at both patellar tendons, 2+ at achilles tendons, Babinski's downgoing.  Strength at foot  Plantar-flexion: 5/5 Dorsi-flexion: 5/5  Eversion: 5/5 Inversion: 5/5  Leg strength  Quad: 5/5 Hamstring: 5/5 Hip flexor: 5/5 Hip abductors: 5/5  Gait unremarkable. Half inch shorter right leg noted   Impression and Recommendations:     This case required medical decision making of moderate complexity.      Note: This dictation was prepared with Dragon dictation along with  smaller phrase technology. Any transcriptional errors that result from this process are unintentional.

## 2015-12-29 NOTE — Assessment & Plan Note (Signed)
Right side is half inch shorter. Recommended heel lift

## 2015-12-29 NOTE — Assessment & Plan Note (Signed)
I believe the patient is more of a piriformis syndrome that is causing more of a sciatic nerve difficult he. We discussed icing regimen, we discussed home exercises. Patient given a handout. Patient will get x-rays to rule out any other bony normality of the lumbar spine could be contribute in. Patient is having some sciatica syndrome but I think is secondary to the piriformis. Patient also was found to have a leg length discrepancy and we'll get a heel lift. Patient will be started on medications including gabapentin and prednisone. Patient come back and see me again in 2 weeks. He could be a candidate for manipulation.

## 2015-12-29 NOTE — Patient Instructions (Signed)
Good to see you.  Xray downstairs Ice 20 minutes 2 times daily. Usually after activity and before bed. Exercises 3 times a week.  Tennis ball in back right pocket with sitting.  Heel lift in your right shoe.  Good shoes with rigid bottom.  [name], [place] or New balance greater then 700 Prednisone daily for 5 days Gabapentin 200mg  at night Avoid any twisting with lifting for now.  OK to walk but on softer surfaces, biking and elliptical could be good as well.  See me again in 2 weeks and we will consider manipulation.

## 2015-12-30 ENCOUNTER — Ambulatory Visit: Payer: 59 | Admitting: Family Medicine

## 2016-01-10 NOTE — Progress Notes (Deleted)
[name] [address], [place] [address] Phone: [phone] Subjective:      CC: low back pain with radiation down right leg Follow-up  [id number]  [name] is a 50 y.o. male coming in with complaint of low back pain. Patient states that this is associated with walking. Seems worse with walking and does have radiation and numbness going down the right leg. Patient denies any weakness. Concerned though because he has had similar presentation previously with his neck. Patient past medical history significant for a cervical ACDF.  Patient was found to have more of a piriformis syndrome. Patient was found to have a leg length discrepancy. Patient was to start home exercises, icing protocol, given prednisone and gabapentin for potential of a lumbar radiculopathy. Patient states     x-rays taken 12/29/2015 were independently visualized by me. Showed mild degenerative changes at L3-L5 but otherwise fairly unremarkable.  No past medical history on file. Past Surgical History:  Procedure Laterality Date  . KNEE SURGERY    . neck surger    . NECK SURGERY     Social History   Social History  . Marital status: Married    Spouse name: N/A  . Number of children: N/A  . Years of education: N/A   Social History Main Topics  . Smoking status: Current Every Day Smoker  . Smokeless tobacco: Not on file  . Alcohol use Not on file  . Drug use: Unknown  . Sexual activity: Not on file   Other Topics Concern  . Not on file   Social History Narrative  . No narrative on file   No Known Allergies Family History  Problem Relation Age of Onset  . Cancer Father     Past medical history, social, surgical and family history all reviewed in electronic medical record.  No pertanent information unless stated regarding to the chief complaint.   Review of Systems: No headache, visual changes, nausea, vomiting, diarrhea, constipation, dizziness,  abdominal pain, skin rash, fevers, chills, night sweats, weight loss, swollen lymph nodes, body aches, joint swelling, chest pain, shortness of breath, mood changes.   Objective  There were no vitals taken for this visit.  General: No apparent distress alert and oriented x3 mood and affect normal, dressed appropriately.  HEENT: Pupils equal, extraocular movements intact  Respiratory: Patient's speak in full sentences and does not appear short of breath  Cardiovascular: No lower extremity edema, non tender, no erythema  Skin: Warm dry intact with no signs of infection or rash on extremities or on axial skeleton.  Abdomen: Soft nontender  Neuro: Cranial nerves II through XII are intact, neurovascularly intact in all extremities with 2+ DTRs and 2+ pulses.  Lymph: No lymphadenopathy of posterior or anterior cervical chain or axillae bilaterally.  Gait normal with good balance and coordination.  MSK:  Non tender with full range of motion and good stability and symmetric strength and tone of shoulders, elbows, wrist, hip, knee and ankles bilaterally.  Back Exam:  Inspection: Unremarkable  Motion: Flexion 35 deg, Extension 20 deg, Side Bending to 45 deg bilaterally,  Rotation to 35 deg bilaterally  SLR laying: Negative  XSLR laying: Negative  Palpable tenderness: tenderness to palpation in the L4-L5 paraspinal musculature as well as the sacroiliac joint on the right. FABER: positive bilaterally with pain and palpation of the right piriformis Sensory change: Gross sensation intact to all lumbar and sacral dermatomes.  Reflexes: 2+ at both patellar tendons,  2+ at achilles tendons, Babinski's downgoing.  Strength at foot  Plantar-flexion: 5/5 Dorsi-flexion: 5/5 Eversion: 5/5 Inversion: 5/5  Leg strength  Quad: 5/5 Hamstring: 5/5 Hip flexor: 5/5 Hip abductors: 5/5  Gait unremarkable. Half inch shorter right leg noted   Impression and Recommendations:     This case required medical decision  making of moderate complexity.      Note: This dictation was prepared with Dragon dictation along with smaller phrase technology. Any transcriptional errors that result from this process are unintentional.

## 2016-01-11 ENCOUNTER — Ambulatory Visit (INDEPENDENT_AMBULATORY_CARE_PROVIDER_SITE_OTHER): Payer: 59 | Admitting: Family Medicine

## 2016-01-11 ENCOUNTER — Ambulatory Visit: Payer: 59 | Admitting: Family Medicine

## 2016-01-11 ENCOUNTER — Encounter: Payer: Self-pay | Admitting: Family Medicine

## 2016-01-11 VITALS — BP 140/68 | HR 94 | Wt 187.0 lb

## 2016-01-11 DIAGNOSIS — M79604 Pain in right leg: Secondary | ICD-10-CM | POA: Diagnosis not present

## 2016-01-11 DIAGNOSIS — M5416 Radiculopathy, lumbar region: Secondary | ICD-10-CM

## 2016-01-11 DIAGNOSIS — R29898 Other symptoms and signs involving the musculoskeletal system: Secondary | ICD-10-CM | POA: Diagnosis not present

## 2016-01-11 MED ORDER — GABAPENTIN 100 MG PO CAPS: 200.0000 mg | ORAL_CAPSULE | Freq: Three times a day (TID) | ORAL | 3 refills | Status: DC

## 2016-01-11 MED FILL — GABAPENTIN 100 MG CAPSULE: 100 | 30 days supply | Qty: 180 | Fill #0

## 2016-01-11 NOTE — Progress Notes (Signed)
[name] [address], [place] [address] Phone: [phone] Subjective:      CC: low back pain with radiation down right leg Follow-up  [id number]  [name] is a 50 y.o. male coming in with complaint of low back pain. Patient states that this is associated with walking. Seems worse with walking and does have radiation and numbness going down the right leg. Patient denies any weakness. Concerned though because he has had similar presentation previously with his neck. Patient past medical history significant for a cervical ACDF.  Patient was found to have more of a piriformis syndrome. Patient was found to have a leg length discrepancy. Patient was to start home exercises, icing protocol, given prednisone and gabapentin for potential of a lumbar radiculopathy. Patient states the prednisone did help his back pain significantly. Unfortunately did not help the leg pain. States that it whenever walking seems to get significantly worse. Patient states that he is unable to even walk from the car in the parking lot to my office without having to stop. Patient states that it just feels like the leg is swelling when he does not see anything. Some association with still numbness of the first toe. Would not state that it is weak but is most unbearable to walk.    x-rays taken 12/29/2015 were independently visualized by me. Showed mild degenerative changes at L3-L5 but otherwise fairly unremarkable.  No past medical history on file. Past Surgical History:  Procedure Laterality Date  . KNEE SURGERY    . neck surger    . NECK SURGERY     Social History   Social History  . Marital status: Married    Spouse name: N/A  . Number of children: N/A  . Years of education: N/A   Social History Main Topics  . Smoking status: Current Every Day Smoker  . Smokeless tobacco: None  . Alcohol use None  . Drug use: Unknown  . Sexual activity: Not Asked    Other Topics Concern  . None   Social History Narrative  . None   No Known Allergies Family History  Problem Relation Age of Onset  . Cancer Father     Past medical history, social, surgical and family history all reviewed in electronic medical record.  No pertanent information unless stated regarding to the chief complaint.   Review of Systems: No headache, visual changes, nausea, vomiting, diarrhea, constipation, dizziness, abdominal pain, skin rash, fevers, chills, night sweats, weight loss, swollen lymph nodes, body aches, joint swelling, chest pain, shortness of breath, mood changes.   Objective  Blood pressure 140/68, pulse 94, weight 187 lb (84.8 kg), SpO2 99 %.  General: No apparent distress alert and oriented x3 mood and affect normal, dressed appropriately.  HEENT: Pupils equal, extraocular movements intact  Respiratory: Patient's speak in full sentences and does not appear short of breath  Cardiovascular: No lower extremity edema, non tender, no erythema  Skin: Warm dry intact with no signs of infection or rash on extremities or on axial skeleton.  Abdomen: Soft nontender  Neuro: Cranial nerves II through XII are intact, neurovascularly intact in all extremities with 2+ DTRs and 2+ pulses.  Lymph: No lymphadenopathy of posterior or anterior cervical chain or axillae bilaterally.  Gait normal with good balance and coordination.  MSK:  Non tender with full range of motion and good stability and symmetric strength and tone of shoulders, elbows, wrist, hip, knee and ankles bilaterally.  Back  Exam:  Inspection: Unremarkable  Motion: Flexion 35 deg, Extension 20 deg, Side Bending to 45 deg bilaterally,  Rotation to 35 deg bilaterally  SLR laying: Negative  XSLR laying: Negative  Palpable tenderness: tenderness to palpation in the L4-L5 paraspinal musculature as well as the sacroiliac joint on the right. FABER: positive bilaterally with pain and palpation of the right  piriformis Sensory change: Gross sensation intact to all lumbar and sacral dermatomes.  Reflexes: 2+ at both patellar tendons, 2+ at achilles tendons, Babinski's downgoing.  Strength at foot  Plantar-flexion: 5/5 Dorsi-flexion: 5/5 Eversion: 5/5 Inversion: 5/5  Leg strength  4 out of 5 strength of the quadriceps and hip flexor compared to the contralateral side Patient also found to have decrease in size of quadricep muscle on the right side compared to the less than by approximately 1 cm. Patient also has some decrease in hair of the lower extremity compared to the contralateral side Half inch shorter right leg noted   Impression and Recommendations:     This case required medical decision making of moderate complexity.      Note: This dictation was prepared with Dragon dictation along with smaller phrase technology. Any transcriptional errors that result from this process are unintentional.

## 2016-01-11 NOTE — Addendum Note (Signed)
Addended by: Douglass Rivers T on: 01/11/2016 04:01 PM   Modules accepted: Orders

## 2016-01-11 NOTE — Patient Instructions (Addendum)
Good to see you  Ice is your friend We will get ABI to test blood flow in the leg.  We also will get MRI of your back and see what is going on  You do have some weakness and has loss muscle mass.  Gabapentin 200mg  3 times a day  Depending on the imaging I will call you or talk to your significant other See me again in 3-4 weeks at worst case.

## 2016-01-11 NOTE — Assessment & Plan Note (Addendum)
Patient is having more of a weakness in the right leg as well as muscle atrophy it seems. Patient's x-rays show some mild osteophytic changes at L3-L5 that would be consistent with the atrophy. Patient has not responded significantly well to conservative therapy. I'm concerned secondary to the amount of weakness compared to patient's previous office visit. Patient was on prednisone. Do feel that further workup including advance imaging an ABI secondary to his history been a smoker is necessary at this time. We will rule out any other type of nerve root impingement that could be contributing. Worsening symptoms we may need to consider a piriformis injection but we'll discuss at follow-up after the MRI. Increase patient's gabapentin at this time as well.

## 2016-01-17 ENCOUNTER — Other Ambulatory Visit: Payer: Self-pay | Admitting: Family Medicine

## 2016-01-17 DIAGNOSIS — I739 Peripheral vascular disease, unspecified: Secondary | ICD-10-CM

## 2016-01-20 ENCOUNTER — Other Ambulatory Visit: Payer: Self-pay | Admitting: Family Medicine

## 2016-01-20 ENCOUNTER — Ambulatory Visit (HOSPITAL_COMMUNITY)
Admission: RE | Admit: 2016-01-20 | Discharge: 2016-01-20 | Disposition: A | Discharge status: Home / Self Care | Payer: 59 | Source: Ambulatory Visit | Attending: Family Medicine | Admitting: Family Medicine

## 2016-01-20 ENCOUNTER — Other Ambulatory Visit: Payer: Self-pay | Admitting: *Deleted

## 2016-01-20 DIAGNOSIS — I745 Embolism and thrombosis of iliac artery: Secondary | ICD-10-CM | POA: Diagnosis not present

## 2016-01-20 DIAGNOSIS — R938 Abnormal findings on diagnostic imaging of other specified body structures: Secondary | ICD-10-CM | POA: Insufficient documentation

## 2016-01-20 DIAGNOSIS — I739 Peripheral vascular disease, unspecified: Secondary | ICD-10-CM

## 2016-01-20 DIAGNOSIS — I771 Stricture of artery: Secondary | ICD-10-CM | POA: Insufficient documentation

## 2016-01-20 DIAGNOSIS — Z720 Tobacco use: Secondary | ICD-10-CM | POA: Insufficient documentation

## 2016-01-20 DIAGNOSIS — R209 Unspecified disturbances of skin sensation: Secondary | ICD-10-CM | POA: Diagnosis not present

## 2016-01-22 ENCOUNTER — Other Ambulatory Visit: Payer: 59

## 2016-02-11 ENCOUNTER — Encounter: Payer: Self-pay | Admitting: Vascular Surgery

## 2016-02-16 ENCOUNTER — Encounter: Payer: Self-pay | Admitting: Vascular Surgery

## 2016-02-16 ENCOUNTER — Ambulatory Visit (INDEPENDENT_AMBULATORY_CARE_PROVIDER_SITE_OTHER): Payer: 59 | Admitting: Vascular Surgery

## 2016-02-16 ENCOUNTER — Other Ambulatory Visit: Payer: Self-pay

## 2016-02-16 VITALS — BP 139/79 | HR 80 | Temp 97.8°F | Resp 16 | Ht 69.0 in | Wt 184.0 lb

## 2016-02-16 DIAGNOSIS — I70211 Atherosclerosis of native arteries of extremities with intermittent claudication, right leg: Secondary | ICD-10-CM | POA: Diagnosis not present

## 2016-02-16 DIAGNOSIS — I745 Embolism and thrombosis of iliac artery: Secondary | ICD-10-CM

## 2016-02-16 NOTE — Progress Notes (Signed)
Referred by:  Lyndal Pulley, DO [place], [place] [id number]  Reason for referral: right leg pain   History of Present Illness  [name] is a 50 y.o. ([birth year]) male who presents with chief complaint: right leg pain.  Onset of symptom occurred late last year without obvious sx.  Pain is described as aching, tourniquet like sensation in thigh and calf, severity 3-6/10, and associated with short distance ambulation (10 yd).  Patient has attempted to treat this pain with rest.  The patient has no rest pain symptoms also and no leg wounds/ulcers.  Atherosclerotic risk factors include: active smoking.  Past Medical History: Limb discrepancy Piriformis Syndrome   Past Surgical History:  Procedure Laterality Date  . KNEE SURGERY    . neck surger    . NECK SURGERY      Social History   Social History  . Marital status: Married    Spouse name: N/A  . Number of children: N/A  . Years of education: N/A   Occupational History  . Not on file.   Social History Main Topics  . Smoking status: Current Every Day Smoker  . Smokeless tobacco: Never Used  . Alcohol use Not on file  . Drug use: Unknown  . Sexual activity: Not on file   Other Topics Concern  . Not on file   Social History Narrative  . No narrative on file    Family History  Problem Relation Age of Onset  . Cancer Father     Current Outpatient Prescriptions  Medication Sig Dispense Refill  . gabapentin (NEURONTIN) 100 MG capsule Take 2 capsules (200 mg total) by mouth 3 (three) times daily. 180 capsule 3   No current facility-administered medications for this visit.     No Known Allergies   REVIEW OF SYSTEMS:   Cardiac:  positive for: Shortness of breath upon exertion, negative for: Chest pain or chest pressure and Shortness of breath when lying flat,   Vascular:  positive for: Pain in calf, thigh, or hip brought on by ambulation,  negative for: Pain in feet at night  that wakes you up from your sleep, Blood clot in your veins and Leg swelling  Pulmonary:  positive for: no symptoms,  negative for: Oxygen at home, Productive cough and Wheezing  Neurologic:  positive for: Problems with dizziness, negative for: Sudden weakness in arms or legs, Sudden numbness in arms or legs, Sudden onset of difficulty speaking or slurred speech, Temporary loss of vision in one eye and Problems with dizziness  Gastrointestinal:  positive for: no symptoms, negative for: Blood in stool and Vomited blood  Genitourinary:  positive for: no symptoms, negative for: Burning when urinating and Blood in urine  Psychiatric:  positive for: no symptoms,  negative for: Major depression  Hematologic:  positive for: no symptoms,  negative for: negative for: Bleeding problems and Problems with blood clotting too easily  Dermatologic:  positive for: no symptoms, negative for: Rashes or ulcers  Constitutional:  positive for: no symptoms, negative for: Fever or chills   For VQI Use Only   PRE-ADM LIVING: Home  AMB STATUS: Ambulatory  CAD Sx: None  PRIOR CHF: None  STRESS TEST: No   Physical Examination  Vitals:   02/16/16 1032  BP: 139/79  Pulse: 80  Resp: 16  Temp: 97.8 F (36.6 C)  SpO2: 100%  Weight: 184 lb (83.5 kg)  Height: 5\' 9"  (1.753 m)    Body  mass index is 27.17 kg/m.  General: Alert, O x 3, WD,NAD  Head: [place]/AT,   Ear/Nose/Throat: Hearing grossly intact, nares without erythema or drainage, oropharynx with  Erythema without Exudate , Mallampati score: 3, Dentition intact  Eyes: PERRLA, EOMI,   Neck: Supple, mid-line trachea,    Pulmonary: Sym exp, good B air movt,CTA B  Cardiac: RRR, Nl S1, S2, no Murmurs, No rubs, No S3,S4  Vascular: Vessel Right Left  Radial Palpable Palpable  Brachial Palpable Palpable  Carotid Palpable, No Bruit Palpable, No Bruit  Aorta Not palpable N/A  Femoral faintly palpble Palpable  Popliteal Not  palpable Not palpable  PT Not palpable Palpable  DP Not palpable Palpable   Gastrointestinal: soft, non-distended, non-tender to palpation, No guarding or rebound, no HSM, no masses, no CVAT B, No palpable prominent aortic pulse,    Musculoskeletal: M/S 5/5 throughout  , Extremities without ischemic changes    Neurologic: CN 2-12 intact , Pain and light touch intact in extremities , Motor exam as listed above  Psychiatric: Judgement intact, Mood & affect appropriate for pt's clinical situation  Dermatologic: See M/S exam for extremity exam, No rashes otherwise noted  Lymph : Palpable lymph nodes: None   Non-Invasive Vascular Imaging  ABI (Date: 01/20/16)  R:   ABI: 0.80,   DP: mono  PT: mono  TBI:  0.47  L:   ABI: 1.3,   DP: tri  PT: tri  TBI: 1.23   BLE physiologic study (01/20/16)  R: Evidence of inflow disease, R CIA occlusion, monophasic flow throughout  L: triphasic throughout   Outside Studies/Documentation 6 pages of outside documents were reviewed including: outpatient physiologic studies with BLE ABI, PCP note   Medical Decision Making  [name] is a 50 y.o. male who presents with: RLE lifestyle limiting intermittent claudication   Based on this patient's history and physical exam, I recommend: Aortogram, BRo, possible R iliac intervention. I discussed with the patient the nature of angiographic procedures, especially the limited patencies of any endovascular intervention.   The patient is aware of that the risks of an angiographic procedure include but are not limited to: bleeding, infection, access site complications, renal failure, embolization, rupture of vessel, dissection, arteriovenous fistula, possible need for emergent surgical intervention, possible need for surgical procedures to treat the patient's pathology, anaphylactic reaction to contrast, and stroke and death.   The patient is aware of the risks and agrees to proceed.  He is  scheduled for 5 OCT 17.  I discussed in depth with the patient the nature of atherosclerosis, and emphasized the importance of maximal medical management including strict control of blood pressure, blood glucose, and lipid levels, antiplatelet agent, obtaining regular exercise, and cessation of smoking.    The patient is aware that without maximal medical management the underlying atherosclerotic disease process will progress, limiting the benefit of any interventions.  I discussed in depth with the patient a walking plan and how to execute such. The patient is currently on a statin:  not medically indicated.. The patient is currently on an anti-platelet: ASA.  Thank you for allowing [place] to participate in this patient's care.   [name], MD, FACS [facility] Office: [phone] Pager: [phone]  02/16/2016, 11:19 AM

## 2016-02-21 MED FILL — GABAPENTIN 100 MG CAPSULE: 100 | 30 days supply | Qty: 180 | Fill #1

## 2016-02-24 ENCOUNTER — Encounter (HOSPITAL_COMMUNITY)
Admission: RE | Disposition: A | Discharge status: Home / Self Care | Payer: Self-pay | Source: Ambulatory Visit | Attending: Vascular Surgery

## 2016-02-24 ENCOUNTER — Ambulatory Visit (HOSPITAL_COMMUNITY)
Admission: RE | Admit: 2016-02-24 | Discharge: 2016-02-24 | Disposition: A | Discharge status: Home / Self Care | Payer: 59 | Source: Ambulatory Visit | Attending: Vascular Surgery | Admitting: Vascular Surgery

## 2016-02-24 ENCOUNTER — Telehealth: Payer: Self-pay | Admitting: Vascular Surgery

## 2016-02-24 DIAGNOSIS — I70211 Atherosclerosis of native arteries of extremities with intermittent claudication, right leg: Secondary | ICD-10-CM | POA: Diagnosis not present

## 2016-02-24 DIAGNOSIS — F17210 Nicotine dependence, cigarettes, uncomplicated: Secondary | ICD-10-CM | POA: Insufficient documentation

## 2016-02-24 DIAGNOSIS — I7092 Chronic total occlusion of artery of the extremities: Secondary | ICD-10-CM | POA: Diagnosis not present

## 2016-02-24 DIAGNOSIS — I70219 Atherosclerosis of native arteries of extremities with intermittent claudication, unspecified extremity: Secondary | ICD-10-CM

## 2016-02-24 HISTORY — PX: PERIPHERAL VASCULAR CATHETERIZATION: SHX172C

## 2016-02-24 LAB — POCT I-STAT, CHEM 8
BUN: 9 mg/dL (ref 6–20)
CALCIUM ION: 1.18 mmol/L (ref 1.15–1.40)
CHLORIDE: 103 mmol/L (ref 101–111)
Creatinine, Ser: 0.9 mg/dL (ref 0.61–1.24)
GLUCOSE: 102 mg/dL — AB (ref 65–99)
HCT: 31 % — ABNORMAL LOW (ref 39.0–52.0)
Hemoglobin: 10.5 g/dL — ABNORMAL LOW (ref 13.0–17.0)
POTASSIUM: 4.2 mmol/L (ref 3.5–5.1)
SODIUM: 140 mmol/L (ref 135–145)
TCO2: 26 mmol/L (ref 0–100)

## 2016-02-24 LAB — POCT ACTIVATED CLOTTING TIME
ACTIVATED CLOTTING TIME: 208 s
ACTIVATED CLOTTING TIME: 180 s
ACTIVATED CLOTTING TIME: 186 s
Activated Clotting Time: 191 seconds

## 2016-02-24 SURGERY — ABDOMINAL AORTOGRAM W/LOWER EXTREMITY
Laterality: Right

## 2016-02-24 MED ORDER — CLOPIDOGREL BISULFATE 75 MG PO TABS: 75.0000 mg | ORAL_TABLET | Freq: Every day | ORAL | 11 refills | Status: DC

## 2016-02-24 MED ORDER — LIDOCAINE HCL (PF) 1 % IJ SOLN
INTRAMUSCULAR | Status: DC | PRN
  Administered 2016-02-24: 16 mL via SUBCUTANEOUS

## 2016-02-24 MED ORDER — ACETAMINOPHEN 325 MG PO TABS: 650.0000 mg | ORAL_TABLET | ORAL | Status: DC | PRN

## 2016-02-24 MED ORDER — FENTANYL CITRATE (PF) 100 MCG/2ML IJ SOLN
INTRAMUSCULAR | Status: DC | PRN
  Administered 2016-02-24: 25 ug via INTRAVENOUS
  Administered 2016-02-24: 50 ug via INTRAVENOUS

## 2016-02-24 MED ORDER — HEPARIN (PORCINE) IN NACL 2-0.9 UNIT/ML-% IJ SOLN
INTRAMUSCULAR | Status: DC | PRN
  Administered 2016-02-24: 1000 mL

## 2016-02-24 MED ORDER — CLOPIDOGREL BISULFATE 300 MG PO TABS
ORAL_TABLET | ORAL | Status: AC
  Administered 2016-02-24: 300 mg via ORAL
  Filled 2016-02-24: qty 1

## 2016-02-24 MED ORDER — CLOPIDOGREL BISULFATE 75 MG PO TABS
300.0000 mg | ORAL_TABLET | Freq: Once | ORAL | Status: AC
  Administered 2016-02-24: 300 mg via ORAL

## 2016-02-24 MED ORDER — LIDOCAINE HCL (PF) 1 % IJ SOLN
INTRAMUSCULAR | Status: AC
Start: 2016-02-24 — End: 2016-02-24
  Filled 2016-02-24: qty 30

## 2016-02-24 MED ORDER — SODIUM CHLORIDE 0.9 % IV SOLN: 1.0000 mL/kg/h | INTRAVENOUS | Status: DC

## 2016-02-24 MED ORDER — IODIXANOL 320 MG/ML IV SOLN
INTRAVENOUS | Status: DC | PRN
  Administered 2016-02-24: 185 mL via INTRA_ARTERIAL

## 2016-02-24 MED ORDER — HEPARIN SODIUM (PORCINE) 1000 UNIT/ML IJ SOLN
INTRAMUSCULAR | Status: DC | PRN
  Administered 2016-02-24: 2000 [IU] via INTRAVENOUS
  Administered 2016-02-24: 8000 [IU] via INTRAVENOUS

## 2016-02-24 MED ORDER — SODIUM CHLORIDE 0.9 % IV SOLN
INTRAVENOUS | Status: DC
  Administered 2016-02-24: 07:00:00 via INTRAVENOUS

## 2016-02-24 MED ORDER — MIDAZOLAM HCL 2 MG/2ML IJ SOLN
INTRAMUSCULAR | Status: DC | PRN
  Administered 2016-02-24 (×2): 1 mg via INTRAVENOUS

## 2016-02-24 MED ORDER — FENTANYL CITRATE (PF) 100 MCG/2ML IJ SOLN
INTRAMUSCULAR | Status: AC
  Filled 2016-02-24: qty 2

## 2016-02-24 MED ORDER — MIDAZOLAM HCL 2 MG/2ML IJ SOLN
INTRAMUSCULAR | Status: AC
  Filled 2016-02-24: qty 2

## 2016-02-24 MED ORDER — HEPARIN SODIUM (PORCINE) 1000 UNIT/ML IJ SOLN
INTRAMUSCULAR | Status: AC
  Filled 2016-02-24: qty 1

## 2016-02-24 MED FILL — CLOPIDOGREL 75 MG TABLET: 75 | 30 days supply | Qty: 30 | Fill #0

## 2016-02-24 SURGICAL SUPPLY — 18 items
CATH ANGIO 5F BER2 65CM (CATHETERS) ×3 IMPLANT
CATH OMNI FLUSH 5F 65CM (CATHETERS) ×3 IMPLANT
CATH STRAIGHT 5FR 65CM (CATHETERS) ×3 IMPLANT
DEVICE CONTINUOUS FLUSH (MISCELLANEOUS) ×3 IMPLANT
DEVICE TORQUE .025-.038 (MISCELLANEOUS) ×3 IMPLANT
GUIDEWIRE ANGLED .035X150CM (WIRE) ×3 IMPLANT
KIT ENCORE 26 ADVANTAGE (KITS) ×3 IMPLANT
KIT MICROINTRODUCER STIFF 5F (SHEATH) ×3 IMPLANT
KIT PV (KITS) ×3 IMPLANT
SHEATH BRITE TIP 7FR 35CM (SHEATH) ×3 IMPLANT
SHEATH PINNACLE 5F 10CM (SHEATH) ×3 IMPLANT
STENT LIFESTREAM 8X37X80 (Permanent Stent) ×3 IMPLANT
SYR MEDRAD MARK V 150ML (SYRINGE) ×3 IMPLANT
TAPE RADIOPAQUE TURBO (MISCELLANEOUS) ×3 IMPLANT
TRANSDUCER W/STOPCOCK (MISCELLANEOUS) ×3 IMPLANT
TRAY PV CATH (CUSTOM PROCEDURE TRAY) ×3 IMPLANT
WIRE BENTSON .035X145CM (WIRE) ×6 IMPLANT
WIRE ROSEN-J .035X260CM (WIRE) ×3 IMPLANT

## 2016-02-24 NOTE — H&P (View-Only) (Signed)
Referred by:  Lyndal Pulley, DO [place], [place] [address]  Reason for referral: right leg pain   History of Present Illness  [name] is a 50 y.o. (Jul 15, 1965) male who presents with chief complaint: right leg pain.  Onset of symptom occurred late last year without obvious sx.  Pain is described as aching, tourniquet like sensation in thigh and calf, severity 3-6/10, and associated with short distance ambulation (108 yd).  Patient has attempted to treat this pain with rest.  The patient has no rest pain symptoms also and no leg wounds/ulcers.  Atherosclerotic risk factors include: active smoking.  Past Medical History: Limb discrepancy Piriformis Syndrome   Past Surgical History:  Procedure Laterality Date  . KNEE SURGERY    . neck surger    . NECK SURGERY      Social History   Social History  . Marital status: Married    Spouse name: N/A  . Number of children: N/A  . Years of education: N/A   Occupational History  . Not on file.   Social History Main Topics  . Smoking status: Current Every Day Smoker  . Smokeless tobacco: Never Used  . Alcohol use Not on file  . Drug use: Unknown  . Sexual activity: Not on file   Other Topics Concern  . Not on file   Social History Narrative  . No narrative on file    Family History  Problem Relation Age of Onset  . Cancer Father     Current Outpatient Prescriptions  Medication Sig Dispense Refill  . gabapentin (NEURONTIN) 100 MG capsule Take 2 capsules (200 mg total) by mouth 3 (three) times daily. 180 capsule 3   No current facility-administered medications for this visit.     No Known Allergies   REVIEW OF SYSTEMS:   Cardiac:  positive for: Shortness of breath upon exertion, negative for: Chest pain or chest pressure and Shortness of breath when lying flat,   Vascular:  positive for: Pain in calf, thigh, or hip brought on by ambulation,  negative for: Pain in feet at night  that wakes you up from your sleep, Blood clot in your veins and Leg swelling  Pulmonary:  positive for: no symptoms,  negative for: Oxygen at home, Productive cough and Wheezing  Neurologic:  positive for: Problems with dizziness, negative for: Sudden weakness in arms or legs, Sudden numbness in arms or legs, Sudden onset of difficulty speaking or slurred speech, Temporary loss of vision in one eye and Problems with dizziness  Gastrointestinal:  positive for: no symptoms, negative for: Blood in stool and Vomited blood  Genitourinary:  positive for: no symptoms, negative for: Burning when urinating and Blood in urine  Psychiatric:  positive for: no symptoms,  negative for: Major depression  Hematologic:  positive for: no symptoms,  negative for: negative for: Bleeding problems and Problems with blood clotting too easily  Dermatologic:  positive for: no symptoms, negative for: Rashes or ulcers  Constitutional:  positive for: no symptoms, negative for: Fever or chills   For VQI Use Only   PRE-ADM LIVING: Home  AMB STATUS: Ambulatory  CAD Sx: None  PRIOR CHF: None  STRESS TEST: No   Physical Examination  Vitals:   02/16/16 1032  BP: 139/79  Pulse: 80  Resp: 16  Temp: 97.8 F (36.6 C)  SpO2: 100%  Weight: 184 lb (83.5 kg)  Height: 5\' 9"  (1.753 m)    Body  mass index is 27.17 kg/m.  General: Alert, O x 3, WD,NAD  Head: [place]/AT,   Ear/Nose/Throat: Hearing grossly intact, nares without erythema or drainage, oropharynx with  Erythema without Exudate , Mallampati score: 3, Dentition intact  Eyes: PERRLA, EOMI,   Neck: Supple, mid-line trachea,    Pulmonary: Sym exp, good B air movt,CTA B  Cardiac: RRR, Nl S1, S2, no Murmurs, No rubs, No S3,S4  Vascular: Vessel Right Left  Radial Palpable Palpable  Brachial Palpable Palpable  Carotid Palpable, No Bruit Palpable, No Bruit  Aorta Not palpable N/A  Femoral faintly palpble Palpable  Popliteal Not  palpable Not palpable  PT Not palpable Palpable  DP Not palpable Palpable   Gastrointestinal: soft, non-distended, non-tender to palpation, No guarding or rebound, no HSM, no masses, no CVAT B, No palpable prominent aortic pulse,    Musculoskeletal: M/S 5/5 throughout  , Extremities without ischemic changes    Neurologic: CN 2-12 intact , Pain and light touch intact in extremities , Motor exam as listed above  Psychiatric: Judgement intact, Mood & affect appropriate for pt's clinical situation  Dermatologic: See M/S exam for extremity exam, No rashes otherwise noted  Lymph : Palpable lymph nodes: None   Non-Invasive Vascular Imaging  ABI (Date: 01/20/16)  R:   ABI: 0.80,   DP: mono  PT: mono  TBI:  0.47  L:   ABI: 1.3,   DP: tri  PT: tri  TBI: 1.23   BLE physiologic study (01/20/16)  R: Evidence of inflow disease, R CIA occlusion, monophasic flow throughout  L: triphasic throughout   Outside Studies/Documentation 6 pages of outside documents were reviewed including: outpatient physiologic studies with BLE ABI, PCP note   Medical Decision Making  [name] is a 50 y.o. male who presents with: RLE lifestyle limiting intermittent claudication   Based on this patient's history and physical exam, I recommend: Aortogram, BRo, possible R iliac intervention. I discussed with the patient the nature of angiographic procedures, especially the limited patencies of any endovascular intervention.   The patient is aware of that the risks of an angiographic procedure include but are not limited to: bleeding, infection, access site complications, renal failure, embolization, rupture of vessel, dissection, arteriovenous fistula, possible need for emergent surgical intervention, possible need for surgical procedures to treat the patient's pathology, anaphylactic reaction to contrast, and stroke and death.   The patient is aware of the risks and agrees to proceed.  He is  scheduled for 5 OCT 17.  I discussed in depth with the patient the nature of atherosclerosis, and emphasized the importance of maximal medical management including strict control of blood pressure, blood glucose, and lipid levels, antiplatelet agent, obtaining regular exercise, and cessation of smoking.    The patient is aware that without maximal medical management the underlying atherosclerotic disease process will progress, limiting the benefit of any interventions.  I discussed in depth with the patient a walking plan and how to execute such. The patient is currently on a statin:  not medically indicated.. The patient is currently on an anti-platelet: ASA.  Thank you for allowing [place] to participate in this patient's care.   [name], MD, FACS [facility] Office: [phone] Pager: [phone]  02/16/2016, 11:19 AM

## 2016-02-24 NOTE — Progress Notes (Signed)
Site area: Media planner  Site Prior to Removal:  Level 0 Pressure Applied For: 25 min each side for hemostasis Manual:   yes Patient Status During Pull: A/O  Post Pull Site:  Level o Post Pull Instructions Given:  Post instructions give and pt understands Post Pull Pulses Present: Rt dp/pt present   Lt dp/pt present Dressing Applied:  Tegaderm and a 4x4 applied to Rt and Lt groins Bedrest begins @ 11:30:00 Comments: Pt leaves cath lab holding area and stable condition. Rt and lt groins are unremarkable. Dressing CDI  Rt and Lt groin.

## 2016-02-24 NOTE — Discharge Instructions (Signed)
Angiogram, Care After °Refer to this sheet in the next few weeks. These instructions provide you with information about caring for yourself after your procedure. Your health care provider may also give you more specific instructions. Your treatment has been planned according to current medical practices, but problems sometimes occur. Call your health care provider if you have any problems or questions after your procedure. °WHAT TO EXPECT AFTER THE PROCEDURE °After your procedure, it is typical to have the following: °· Bruising at the catheter insertion site that usually fades within 1-2 weeks. °· Blood collecting in the tissue (hematoma) that may be painful to the touch. It should usually decrease in size and tenderness within 1-2 weeks. °HOME CARE INSTRUCTIONS °· Take medicines only as directed by your health care provider. °· You may shower 24-48 hours after the procedure or as directed by your health care provider. Remove the bandage (dressing) and gently wash the site with plain soap and water. Pat the area dry with a clean towel. Do not rub the site, because this may cause bleeding. °· Do not take baths, swim, or use a hot tub until your health care provider approves. °· Check your insertion site every day for redness, swelling, or drainage. °· Do not apply powder or lotion to the site. °· Do not lift over 10 lb (4.5 kg) for 5 days after your procedure or as directed by your health care provider. °· Ask your health care provider when it is okay to: °¨ Return to work or school. °¨ Resume usual physical activities or sports. °¨ Resume sexual activity. °· Do not drive home if you are discharged the same day as the procedure. Have someone else drive you. °· You may drive 24 hours after the procedure unless otherwise instructed by your health care provider. °· Do not operate machinery or power tools for 24 hours after the procedure or as directed by your health care provider. °· If your procedure was done as an  outpatient procedure, which means that you went home the same day as your procedure, a responsible adult should be with you for the first 24 hours after you arrive home. °· Keep all follow-up visits as directed by your health care provider. This is important. °SEEK MEDICAL CARE IF: °· You have a fever. °· You have chills. °· You have increased bleeding from the catheter insertion site. Hold pressure on the site. °SEEK IMMEDIATE MEDICAL CARE IF: °· You have unusual pain at the catheter insertion site. °· You have redness, warmth, or swelling at the catheter insertion site. °· You have drainage (other than a small amount of blood on the dressing) from the catheter insertion site. °· The catheter insertion site is bleeding, and the bleeding does not stop after 30 minutes of holding steady pressure on the site. °· The area near or just beyond the catheter insertion site becomes pale, cool, tingly, or numb. °  °This information is not intended to replace advice given to you by your health care provider. Make sure you discuss any questions you have with your health care provider. °  °Document Released: 11/24/2004 Document Revised: 05/29/2014 Document Reviewed: 10/09/2012 °Elsevier Interactive Patient Education ©2016 [employer] ° °

## 2016-02-24 NOTE — Interval H&P Note (Signed)
[facility]  History and Physical Update  The patient was interviewed and re-examined.  The patient's previous History and Physical has been reviewed and is unchanged from my consult.  There is no change in the plan of care: aortogram, bilateral leg runoff, and possible right iliac intervention.     I discussed with the patient the nature of angiographic procedures, especially the limited patencies of any endovascular intervention.    The patient is aware of that the risks of an angiographic procedure include but are not limited to: bleeding, infection, access site complications, renal failure, embolization, rupture of vessel, dissection, arteriovenous fistula, possible need for emergent surgical intervention, possible need for surgical procedures to treat the patient's pathology, anaphylactic reaction to contrast, and stroke and death.    The patient is aware of the risks and agrees to proceed.   [name], MD [facility] Office: [phone] Pager: [phone]  02/24/2016, 7:08 AM

## 2016-02-24 NOTE — Op Note (Signed)
OPERATIVE NOTE   PROCEDURE: 1.  Bilateral common femoral artery cannulation under ultrasound guidance 2.  Placement of catheter in aorta x 2 3.  Aortogram 4.  Conscious sedation for 76 minutes 5.  Limited bilateral leg runoff (pelvic angiogram) 6.  Right common iliac artery subintimal angioplasty and stenting (coverred 8 mm x 36 mm) 7.  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  POST-OPERATIVE DIAGNOSIS: same as above   SURGEON: [name], MD  ANESTHESIA: conscious sedation  ESTIMATED BLOOD LOSS: 50 cc  CONTRAST: 185 cc  FINDING(S):  Aorta: patent  Superior mesenteric artery: patent Celiac artery: patent   Right Left  RA patent patent  CIA Occluded, resolved with stenting and angioplasty Patent  EIA patent patent  IIA patent patent  CFA patent patent  SFA patent   PFA patent   Pop patent   Trif patent   AT Patent, distal washes out but likely patent   Pero Patent, distal washes out but likely patent   PT Patent, dominant runoff    SPECIMEN(S):  none  INDICATIONS:   [name] is a 50 y.o. 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  The patient presents for: aortogram, bilateral leg runoff, and possible right iliac intervention.  I discussed with the patient the nature of angiographic procedures, especially the limited patencies of any endovascular intervention.  The patient is aware of that the risks of an angiographic procedure include but are not limited to: bleeding, infection, access site complications, renal failure, embolization, rupture of vessel, dissection, possible need for emergent surgical intervention, possible need for surgical procedures to treat the patient's pathology, and stroke and death.  The patient is aware of the  risks and agrees to proceed.  DESCRIPTION: After full informed consent was obtained from the patient, the patient was brought back to the angiography suite.  The patient was placed supine upon the angiography table and connected to cardiopulmonary monitoring equipment.  The patient was then given conscious sedation, the amounts of which are documented in the patient's chart.  A circulating radiologic technician maintained continuous monitoring of the patient's cardiopulmonary status.  Additionally, the control room radiologic technician provided backup monitoring throughout the procedure.  The patient was prepped and drape in the standard fashion for an angiographic procedure.  At this point, attention was turned to the left groin.  Under ultrasound guidance, the subcutaneous tissue surrounding the left common femoral artery was anesthesized with 1% lidocaine with epinephrine.  The artery was then cannulated with a micropuncture needle.  The microwire was advanced into the iliac arterial system.  The needle was exchanged for a microsheath, which was loaded into the common femoral artery over the wire.  The microwire was exchanged for a [facility] wire which was advanced into the aorta.  The microsheath was then exchanged for a 5-Fr sheath which was loaded into the common femoral artery.  The Omniflush catheter was then loaded over the wire up to the level of L1.  The catheter was connected to the power injector circuit.  After de-airring and de-clotting the circuit, a power injector aortogram was completed.  I pulled the catheter down to the distal aorta and a bilateral pelvic angiogram was completed.  The findings are listed above.  Based on the images, I felt an attempt at revascularization of the right common iliac artery  occlusion was indicated.  Under ultrasound guidance, the subcutaneous tissue surrounding the right common femoral artery was anesthesized with 1% lidocaine with epinephrine.  The artery was  then cannulated with a micropuncture needle.  The microwire was advanced into the iliac arterial system.  The needle was exchanged for a microsheath, which was loaded into the common femoral artery over the wire.  The microwire was exchanged for a [facility] wire which was advanced into the aorta.  The microsheath was then exchanged for a 7-Fr long sheath which was loaded into the common femoral artery.  The patient was given 9000 units of Heparin intravenously, which was a therapeutic bolus. In total, 11000 units of Heparin was administrated to achieve and maintain a therapeutic level of anticoagulation.  At this point, I loaded a straight catheter over the right wire and exchanged the wire for a Glidewire.  Using this combination, I noticed that immediately there was pre-existing dissection tear right common iliac artery leading to the aorta.  I switched the catheter to a BER-2 catheter.  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  I did a hand injection to verify re-entry.  The wire was exchanged for a Rosen wire.    At this point, I repeated a magnified iliac injection to get measurements.  Based on the measurement, I selected a Bard covered stent, 8 mm x 36 mm.  This was loaded with 1-2 mm overlap into the aorta.  I deployed this stent at 10 atm for 1 minute.  The balloon was deflated and removed.  I did a completion aortogram which demonstrated resolution of the right common iliac artery occlusion.     I replaced a Bentson wire into the left Omniflush catheter, straightening out the crook in the catheter.  Both were removed from the sheath together.  I then aspirated the right sheath.  It was connected to the power injector circuit.  An automated right leg runoff was completed.  The findings are listed above.  The sheath was aspirated.  No clots were present and the sheath was reloaded with heparinized saline.    The sheaths will be  removed in the holding area once the anticoagulation resolves.  He will also be given Plavix 300 mg to help with stent patency.   COMPLICATIONS: none  CONDITION: stable   [name], MD, [facility] Office: [phone] Pager: [phone]  02/24/2016, 9:29 AM  02/24/2016, 9:18 AM

## 2016-02-24 NOTE — Telephone Encounter (Signed)
-----   Message from Mena Goes, RN sent at 02/24/2016 10:32 AM EDT ----- Regarding: schedule    ----- Message ----- From: Conrad Carthage, MD Sent: 02/24/2016   9:29 AM To: Vvs Charge Pool  Indy Provost SP:[id number] 25-Oct-1965  PROCEDURE: 1.  Bilateral common femoral artery cannulation under ultrasound guidance 2.  Placement of catheter in aorta x 2 3.  Aortogram 4.  Conscious sedation for 76 minutes 5.  Limited bilateral leg runoff (pelvic angiogram) 6.  Right common iliac artery subintimal angioplasty and stenting (coverred 8 mm x 36 mm) 7.  Right leg runoff via sheath  Follow-up: 4 weeks  Orders(s) for follow-up: BLE ABI

## 2016-02-24 NOTE — Telephone Encounter (Signed)
LVM on mobile # and mailed letter for appt on 11/9 for [place] and 11/10 for F/U

## 2016-02-25 ENCOUNTER — Encounter (HOSPITAL_COMMUNITY): Payer: Self-pay | Admitting: Vascular Surgery

## 2016-03-29 ENCOUNTER — Other Ambulatory Visit: Payer: Self-pay | Admitting: *Deleted

## 2016-03-29 DIAGNOSIS — I739 Peripheral vascular disease, unspecified: Secondary | ICD-10-CM

## 2016-03-29 DIAGNOSIS — Z48812 Encounter for surgical aftercare following surgery on the circulatory system: Secondary | ICD-10-CM

## 2016-03-30 ENCOUNTER — Ambulatory Visit (HOSPITAL_COMMUNITY)
Admission: RE | Admit: 2016-03-30 | Discharge: 2016-03-30 | Disposition: A | Discharge status: Home / Self Care | Payer: 59 | Source: Ambulatory Visit | Attending: Vascular Surgery | Admitting: Vascular Surgery

## 2016-03-30 DIAGNOSIS — I739 Peripheral vascular disease, unspecified: Secondary | ICD-10-CM | POA: Diagnosis not present

## 2016-03-30 DIAGNOSIS — Z48812 Encounter for surgical aftercare following surgery on the circulatory system: Secondary | ICD-10-CM

## 2016-03-31 ENCOUNTER — Encounter: Payer: 59 | Admitting: Vascular Surgery

## 2016-04-06 ENCOUNTER — Encounter: Payer: Self-pay | Admitting: Vascular Surgery

## 2016-04-06 NOTE — Progress Notes (Addendum)
Postoperative Visit   History of Present Illness  [name] is a 50 y.o. male who presents for postoperative follow-up from procedure on Date: 02/24/16: R CIA PTA+S .  The patient notes resolution of lower extremity symptoms.  The patient is able to complete their activities of daily living.  The patient's current symptoms are: none.  Pt is able to walk miles without any claudication.  Past Medical History, Past Surgical History, Social History, Family History, Medications, Allergies, and Review of Systems are unchanged from previous evaluation on 02/24/16.  Current Outpatient Prescriptions  Medication Sig Dispense Refill  . aspirin 81 MG chewable tablet Chew 81 mg by mouth daily.    . clopidogrel (PLAVIX) 75 MG tablet Take 1 tablet (75 mg total) by mouth daily. 30 tablet 11  . esomeprazole (NEXIUM) 20 MG capsule Take 20 mg by mouth daily at 12 noon.    . gabapentin (NEURONTIN) 100 MG capsule Take 2 capsules (200 mg total) by mouth 3 (three) times daily. (Patient taking differently: Take 200 mg by mouth at bedtime. ) 180 capsule 3  . ranitidine (ZANTAC) 150 MG tablet Take 150 mg by mouth daily as needed for heartburn.     No current facility-administered medications for this visit.     No Known Allergies   For VQI Use Only  PRE-ADM LIVING: Home  AMB STATUS: Ambulatory   Physical Examination  Vitals:   04/07/16 0829  BP: (!) 145/88  Pulse: 78  Resp: 20  Temp: 98.2 F (36.8 C)  TempSrc: Oral  SpO2: 100%  Weight: 189 lb 9.6 oz (86 kg)  Height: 5\' 9"  (1.753 m)    Body mass index is 28 kg/m.  General: Alert, O x 3, WD,NAD  Pulmonary: Sym exp, good B air movt,  Cardiac: RRR, Nl S1, S2, no Murmurs, No rubs, No S3,S4  Vascular: Vessel Right Left  Femoral Palpable Palpable  Popliteal Not palpable Not palpable  PT Palpable Palpable  DP Not palpable Palpable   Musculoskeletal: M/S 5/5 throughout  , Extremities without ischemic changes  , No edema present,   , No LDS present  Neurologic: CN grossly intact, Pain and light touch intact in extremities , Motor exam as listed above  ABI (Date: 04/07/2016)  R:   ABI: 1.08 (0.80),   DP: none  PT: tri  TBI:  0.77  L:   ABI: 1.15 (1.3),   DP: tri  PT: tri  TBI: 1.15   Medical Decision Making  [name] is a 50 y.o. male who presents s/p R CIA PTA+S for R CIA CTO.  The patient is completely asx at this point.   Will repeat ABI in 3 month to see if DP occlusion is real.  Regardless, his normal TBI demonstrate normal perfusion of the right foot. Based on his angiographic findings, this patient needs: q3 mon ABI. I discussed in depth with the patient the nature of atherosclerosis, and emphasized the importance of maximal medical management including strict control of blood pressure, blood glucose, and lipid levels, obtaining regular exercise, and cessation of smoking.  The patient is aware that without maximal medical management the underlying atherosclerotic disease process will progress, limiting the benefit of any interventions. The patient is currently not on statin as not medically indicated per patient. The patient is currently on an anti-platelet: ASA, Plavix.  Ok to stop ASA and continue on 75 mg of Plavix daily.  Thank you for allowing [place] to participate in this patient's care.  [name], MD, FACS [facility] [place] Office: [phone] Pager: [phone]

## 2016-04-07 ENCOUNTER — Other Ambulatory Visit: Payer: Self-pay

## 2016-04-07 ENCOUNTER — Encounter: Payer: Self-pay | Admitting: Vascular Surgery

## 2016-04-07 ENCOUNTER — Ambulatory Visit (INDEPENDENT_AMBULATORY_CARE_PROVIDER_SITE_OTHER): Payer: 59 | Admitting: Vascular Surgery

## 2016-04-07 VITALS — BP 145/88 | HR 78 | Temp 98.2°F | Resp 20 | Ht 69.0 in | Wt 189.6 lb

## 2016-04-07 DIAGNOSIS — I70211 Atherosclerosis of native arteries of extremities with intermittent claudication, right leg: Secondary | ICD-10-CM

## 2016-04-07 DIAGNOSIS — I739 Peripheral vascular disease, unspecified: Secondary | ICD-10-CM

## 2016-04-07 MED ORDER — CLOPIDOGREL BISULFATE 75 MG PO TABS: 75.0000 mg | ORAL_TABLET | Freq: Every day | ORAL | 11 refills | Status: DC

## 2016-04-07 MED FILL — CLOPIDOGREL 75 MG TABLET: 75 | 30 days supply | Qty: 30 | Fill #0

## 2016-04-28 ENCOUNTER — Telehealth: Payer: Self-pay

## 2016-04-28 DIAGNOSIS — Z959 Presence of cardiac and vascular implant and graft, unspecified: Secondary | ICD-10-CM

## 2016-04-28 DIAGNOSIS — L819 Disorder of pigmentation, unspecified: Secondary | ICD-10-CM

## 2016-04-28 DIAGNOSIS — I739 Peripheral vascular disease, unspecified: Secondary | ICD-10-CM

## 2016-04-28 NOTE — Telephone Encounter (Signed)
Phone call from pt's wife.  Reported c/o right calf pain with walking, and of noticing a difference in the feeling of right foot in past 24-36 hrs.  Reported the pt. stated the calf pain will ease after resting awhile.  Also stated he reported the toes of the right foot are slightly discolored, and have increased sensitivity, compared to the left foot.  Denied any swelling of the right LE.  Discussed with Dr. Bridgett Larsson; recommended to add on to the office schedule on Mon., 12/11 for Right LE Stent Duplex and ABI's, with office appt.  Also recommended that if symptoms worsen, the pt. should go to the ER.  Will notify pt's wife of above recommendations.

## 2016-05-01 ENCOUNTER — Ambulatory Visit (INDEPENDENT_AMBULATORY_CARE_PROVIDER_SITE_OTHER)
Admission: RE | Admit: 2016-05-01 | Discharge: 2016-05-01 | Disposition: A | Discharge status: Home / Self Care | Payer: 59 | Source: Ambulatory Visit | Attending: Family | Admitting: Family

## 2016-05-01 ENCOUNTER — Ambulatory Visit (INDEPENDENT_AMBULATORY_CARE_PROVIDER_SITE_OTHER): Payer: Self-pay | Admitting: Family

## 2016-05-01 ENCOUNTER — Encounter: Payer: Self-pay | Admitting: Family

## 2016-05-01 ENCOUNTER — Ambulatory Visit (HOSPITAL_COMMUNITY)
Admission: RE | Admit: 2016-05-01 | Discharge: 2016-05-01 | Disposition: A | Discharge status: Home / Self Care | Payer: 59 | Source: Ambulatory Visit | Attending: Family | Admitting: Family

## 2016-05-01 VITALS — BP 135/76 | HR 87 | Temp 97.6°F | Wt 192.0 lb

## 2016-05-01 DIAGNOSIS — I739 Peripheral vascular disease, unspecified: Secondary | ICD-10-CM | POA: Diagnosis not present

## 2016-05-01 DIAGNOSIS — L819 Disorder of pigmentation, unspecified: Secondary | ICD-10-CM | POA: Insufficient documentation

## 2016-05-01 DIAGNOSIS — Z959 Presence of cardiac and vascular implant and graft, unspecified: Secondary | ICD-10-CM

## 2016-05-01 DIAGNOSIS — F17200 Nicotine dependence, unspecified, uncomplicated: Secondary | ICD-10-CM

## 2016-05-01 DIAGNOSIS — I70211 Atherosclerosis of native arteries of extremities with intermittent claudication, right leg: Secondary | ICD-10-CM

## 2016-05-01 NOTE — Patient Instructions (Signed)
Peripheral Vascular Disease Peripheral vascular disease (PVD) is a disease of the blood vessels that are not part of your heart and brain. A simple term for PVD is poor circulation. In most cases, PVD narrows the blood vessels that carry blood from your heart to the rest of your body. This can result in a decreased supply of blood to your arms, legs, and internal organs, like your stomach or kidneys. However, it most often affects a person's lower legs and feet. There are two types of PVD.  Organic PVD. This is the more common type. It is caused by damage to the structure of blood vessels.  Functional PVD. This is caused by conditions that make blood vessels contract and tighten (spasm). Without treatment, PVD tends to get worse over time. PVD can also lead to acute ischemic limb. This is when an arm or limb suddenly has trouble getting enough blood. This is a medical emergency. Follow these instructions at home:  Take medicines only as told by your doctor.  Do not use any tobacco products, including cigarettes, chewing tobacco, or electronic cigarettes. If you need help quitting, ask your doctor.  Lose weight if you are overweight, and maintain a healthy weight as told by your doctor.  Eat a diet that is low in fat and cholesterol. If you need help, ask your doctor.  Exercise regularly. Ask your doctor for some good activities for you.  Take good care of your feet.  Wear comfortable shoes that fit well.  Check your feet often for any cuts or sores. Contact a doctor if:  You have cramps in your legs while walking.  You have leg pain when you are at rest.  You have coldness in a leg or foot.  Your skin changes.  You are unable to get or have an erection (erectile dysfunction).  You have cuts or sores on your feet that are not healing. Get help right away if:  Your arm or leg turns cold and blue.  Your arms or legs become red, warm, swollen, painful, or numb.  You have  chest pain or trouble breathing.  You suddenly have weakness in your face, arm, or leg.  You become very confused or you cannot speak.  You suddenly have a very bad headache.  You suddenly cannot see. This information is not intended to replace advice given to you by your health care provider. Make sure you discuss any questions you have with your health care provider. Document Released: 08/02/2009 Document Revised: 10/14/2015 Document Reviewed: 10/16/2013 Elsevier Interactive Patient Education  2017 Elsevier Inc.      Steps to Quit Smoking Smoking tobacco can be bad for your health. It can also affect almost every organ in your body. Smoking puts you and people around you at risk for many serious long-lasting (chronic) diseases. Quitting smoking is hard, but it is one of the best things that you can do for your health. It is never too late to quit. What are the benefits of quitting smoking? When you quit smoking, you lower your risk for getting serious diseases and conditions. They can include:  Lung cancer or lung disease.  Heart disease.  Stroke.  Heart attack.  Not being able to have children (infertility).  Weak bones (osteoporosis) and broken bones (fractures). If you have coughing, wheezing, and shortness of breath, those symptoms may get better when you quit. You may also get sick less often. If you are pregnant, quitting smoking can help to lower your chances   of having a baby of low birth weight. What can I do to help me quit smoking? Talk with your doctor about what can help you quit smoking. Some things you can do (strategies) include:  Quitting smoking totally, instead of slowly cutting back how much you smoke over a period of time.  Going to in-person counseling. You are more likely to quit if you go to many counseling sessions.  Using resources and support systems, such as:  Online chats with a counselor.  Phone quitlines.  Printed self-help  materials.  Support groups or group counseling.  Text messaging programs.  Mobile phone apps or applications.  Taking medicines. Some of these medicines may have nicotine in them. If you are pregnant or breastfeeding, do not take any medicines to quit smoking unless your doctor says it is okay. Talk with your doctor about counseling or other things that can help you. Talk with your doctor about using more than one strategy at the same time, such as taking medicines while you are also going to in-person counseling. This can help make quitting easier. What things can I do to make it easier to quit? Quitting smoking might feel very hard at first, but there is a lot that you can do to make it easier. Take these steps:  Talk to your family and friends. Ask them to support and encourage you.  Call phone quitlines, reach out to support groups, or work with a counselor.  Ask people who smoke to not smoke around you.  Avoid places that make you want (trigger) to smoke, such as:  Bars.  Parties.  Smoke-break areas at work.  Spend time with people who do not smoke.  Lower the stress in your life. Stress can make you want to smoke. Try these things to help your stress:  Getting regular exercise.  Deep-breathing exercises.  Yoga.  Meditating.  Doing a body scan. To do this, close your eyes, focus on one area of your body at a time from head to toe, and notice which parts of your body are tense. Try to relax the muscles in those areas.  Download or buy apps on your mobile phone or tablet that can help you stick to your quit plan. There are many free apps, such as QuitGuide from the CDC (Centers for Disease Control and Prevention). You can find more support from smokefree.gov and other websites. This information is not intended to replace advice given to you by your health care provider. Make sure you discuss any questions you have with your health care provider. Document Released:  03/04/2009 Document Revised: 01/04/2016 Document Reviewed: 09/22/2014 Elsevier Interactive Patient Education  2017 Elsevier Inc.  

## 2016-05-01 NOTE — Progress Notes (Signed)
Postoperative Visit   History of Present Illness  [name] is a 50 y.o. male who is s/p R CIA PTA+S on 02/24/16 by Dr. Bridgett Larsson.    Pt returns today after phone call from pt's wife.  Reported c/o right calf pain with walking.  Reported the pt. stated the calf pain will ease after resting awhile.  Also stated he reported the toes of the right foot are slightly discolored, and have increased sensitivity, compared to the left foot.  Denied any swelling of the right LE.  He was walking 4-5 miles until 3-4 weeks ago when his right toes started tingling at rest; then about a week ago, after he went under his house to investigate a frozen pipe, his right thigh started cramping after walking about 15 yards.   Dr. Bridgett Larsson last evaluated pt on 04-07-16. At that time pt was asymptomatic.   The patient is able to complete their activities of daily living.    He states he does not have DM.  He states he has not had his cholesterol checked "in a while".    Past Medical History, Past Surgical History, Social History, Family History, Medications, Allergies, and Review of Systems are unchanged from previous evaluation on 02-24-16.  For VQI Use Only  PRE-ADM LIVING: Home  AMB STATUS: Ambulatory  Physical Examination  Vitals:   05/01/16 1155  BP: 135/76  Pulse: 87  Temp: 97.6 F (36.4 C)  SpO2: 100%  Weight: 192 lb (87.1 kg)   Body mass index is 28.35 kg/m.  PHYSICAL EXAMINATION: General: The patient appears their stated age.   HEENT:  No gross abnormalities Pulmonary: Respirations are non-labored, CTAB, adequate air movement in all fields Abdomen: Soft and non-tender. Musculoskeletal: There are no major deformities.   Neurologic: No focal weakness or paresthesias are detected Skin: There are no ulcer or rashes noted. Right toes are more pale and cool than left toes; right toes with sluggish capillary refill.  Psychiatric: The patient has normal affect. Cardiovascular: There is a  regular rate and rhythm without significant murmur appreciated.   Vascular: Vessel Right Left  Radial Palpable Palpable  Carotid Palpable, without bruit Palpable, without bruit  Aorta Not palpable N/A  Femoral Not Palpable Palpable  Popliteal Not palpable Not palpable  PT not Palpable  Palpable  DP not Palpable  Palpable    Medical Decision Making  [name] is a 50 y.o. male who presents s/p R CIA PTA+S on 02/24/16. He was walking 4-5 miles until 3-4 weeks ago when his right toes started tingling at rest; then about a week ago, after he went under his house to investigate a frozen pipe, his right thigh started cramping after walking about 15 yards.   Dr. Bridgett Larsson last evaluated pt on 04-07-16. At that time pt was asymptomatic.   DATA Right iliac artery stent duplex today: decreased visualization of the abdominal vasculature due to overlying bowel gas. Right CIA stent with no significant stenosis, monophasic waveforms within the stent. No previous post procedure duplex available for comparison.  ABI's today: right DP and digit waveforms are absent. Right PT with 0.61 ABI (was 1.08 on 03-30-16), triphasic waveforms. Left ABI remains normal at 1.25 with all triphasic waveforms, digit is normal at 1.03.   I discussed pt HPI and results of today's non invasive vascular lab findings with Dr. Trula Slade. Will schedule pt for arteriogram with bilateral run off, possible intervention with Dr. Bridgett Larsson on 05-08-16.  The patient was counseled re smoking  cessation and given several free resources re smoking cessation.  I discussed in depth with the patient the nature of atherosclerosis, and emphasized the importance of maximal medical management including strict control of blood pressure, blood glucose, and lipid levels, obtaining regular exercise, and cessation of smoking.  The patient is aware that without maximal medical management the underlying atherosclerotic disease process will progress, limiting  the benefit of any interventions. The patient is currently not on a statin. I advised pt to see his PCP as soon as possible since he states he has not had his cholesterol checked "in a while".  The patient is currently on an anti-platelet: Plavix.    Thank you for allowing [place] to participate in this patient's care.  Mianna Iezzi, Sharmon Leyden, RN, MSN, FNP-C [facility] [place] Office: [phone]  05/01/2016, 12:46 PM  Clinic MD: Trula Slade

## 2016-05-02 ENCOUNTER — Other Ambulatory Visit: Payer: Self-pay

## 2016-05-05 MED FILL — CLOPIDOGREL 75 MG TABLET: 75 | 30 days supply | Qty: 30 | Fill #1

## 2016-05-05 MED FILL — GABAPENTIN 100 MG CAPSULE: 100 | 30 days supply | Qty: 180 | Fill #2

## 2016-05-11 ENCOUNTER — Other Ambulatory Visit: Payer: Self-pay | Admitting: *Deleted

## 2016-05-11 ENCOUNTER — Encounter (HOSPITAL_COMMUNITY)
Admission: RE | Disposition: A | Discharge status: Home / Self Care | Payer: Self-pay | Source: Ambulatory Visit | Attending: Vascular Surgery

## 2016-05-11 ENCOUNTER — Ambulatory Visit (HOSPITAL_COMMUNITY)
Admission: RE | Admit: 2016-05-11 | Discharge: 2016-05-11 | Disposition: A | Discharge status: Home / Self Care | Payer: 59 | Source: Ambulatory Visit | Attending: Vascular Surgery | Admitting: Vascular Surgery

## 2016-05-11 DIAGNOSIS — I70219 Atherosclerosis of native arteries of extremities with intermittent claudication, unspecified extremity: Secondary | ICD-10-CM | POA: Diagnosis present

## 2016-05-11 DIAGNOSIS — T82868A Thrombosis of vascular prosthetic devices, implants and grafts, initial encounter: Secondary | ICD-10-CM | POA: Diagnosis not present

## 2016-05-11 DIAGNOSIS — Z01810 Encounter for preprocedural cardiovascular examination: Secondary | ICD-10-CM

## 2016-05-11 DIAGNOSIS — I739 Peripheral vascular disease, unspecified: Secondary | ICD-10-CM

## 2016-05-11 DIAGNOSIS — I70211 Atherosclerosis of native arteries of extremities with intermittent claudication, right leg: Secondary | ICD-10-CM | POA: Insufficient documentation

## 2016-05-11 DIAGNOSIS — Y812 Prosthetic and other implants, materials and accessory general- and plastic-surgery devices associated with adverse incidents: Secondary | ICD-10-CM | POA: Insufficient documentation

## 2016-05-11 HISTORY — PX: PERIPHERAL VASCULAR CATHETERIZATION: SHX172C

## 2016-05-11 LAB — POCT I-STAT, CHEM 8
BUN: 10 mg/dL (ref 6–20)
CALCIUM ION: 1.08 mmol/L — AB (ref 1.15–1.40)
CHLORIDE: 105 mmol/L (ref 101–111)
Creatinine, Ser: 0.9 mg/dL (ref 0.61–1.24)
GLUCOSE: 106 mg/dL — AB (ref 65–99)
HCT: 30 % — ABNORMAL LOW (ref 39.0–52.0)
Hemoglobin: 10.2 g/dL — ABNORMAL LOW (ref 13.0–17.0)
Potassium: 3.8 mmol/L (ref 3.5–5.1)
Sodium: 139 mmol/L (ref 135–145)
TCO2: 23 mmol/L (ref 0–100)

## 2016-05-11 SURGERY — ABDOMINAL AORTOGRAM
Anesthesia: LOCAL

## 2016-05-11 MED ORDER — FENTANYL CITRATE (PF) 100 MCG/2ML IJ SOLN
INTRAMUSCULAR | Status: DC | PRN
  Administered 2016-05-11: 50 ug via INTRAVENOUS

## 2016-05-11 MED ORDER — HEPARIN (PORCINE) IN NACL 2-0.9 UNIT/ML-% IJ SOLN
INTRAMUSCULAR | Status: AC
  Filled 2016-05-11: qty 1000

## 2016-05-11 MED ORDER — LIDOCAINE HCL (PF) 1 % IJ SOLN
INTRAMUSCULAR | Status: AC
  Filled 2016-05-11: qty 30

## 2016-05-11 MED ORDER — MORPHINE SULFATE (PF) 4 MG/ML IV SOLN: 2.0000 mg | INTRAVENOUS | Status: DC | PRN

## 2016-05-11 MED ORDER — LIDOCAINE HCL (PF) 1 % IJ SOLN
INTRAMUSCULAR | Status: DC | PRN
  Administered 2016-05-11: 10 mL via SUBCUTANEOUS

## 2016-05-11 MED ORDER — SODIUM CHLORIDE 0.9 % IV SOLN: 1.0000 mL/kg/h | INTRAVENOUS | Status: DC

## 2016-05-11 MED ORDER — IODIXANOL 320 MG/ML IV SOLN
INTRAVENOUS | Status: DC | PRN
  Administered 2016-05-11: 125 mL via INTRA_ARTERIAL

## 2016-05-11 MED ORDER — SODIUM CHLORIDE 0.9 % IV SOLN
INTRAVENOUS | Status: DC
  Administered 2016-05-11: 08:00:00 via INTRAVENOUS

## 2016-05-11 MED ORDER — MIDAZOLAM HCL 2 MG/2ML IJ SOLN
INTRAMUSCULAR | Status: AC
  Filled 2016-05-11: qty 2

## 2016-05-11 MED ORDER — OXYCODONE-ACETAMINOPHEN 5-325 MG PO TABS: 1.0000 | ORAL_TABLET | ORAL | Status: DC | PRN

## 2016-05-11 MED ORDER — MIDAZOLAM HCL 2 MG/2ML IJ SOLN
INTRAMUSCULAR | Status: DC | PRN
  Administered 2016-05-11: 1 mg via INTRAVENOUS

## 2016-05-11 MED ORDER — HEPARIN (PORCINE) IN NACL 2-0.9 UNIT/ML-% IJ SOLN
INTRAMUSCULAR | Status: DC | PRN
  Administered 2016-05-11: 1000 mL via INTRA_ARTERIAL

## 2016-05-11 MED ORDER — FENTANYL CITRATE (PF) 100 MCG/2ML IJ SOLN
INTRAMUSCULAR | Status: AC
  Filled 2016-05-11: qty 2

## 2016-05-11 SURGICAL SUPPLY — 10 items
CATH OMNI FLUSH 5F 65CM (CATHETERS) ×2 IMPLANT
COVER PRB 48X5XTLSCP FOLD TPE (BAG) ×1 IMPLANT
COVER PROBE 5X48 (BAG) ×1
KIT MICROINTRODUCER STIFF 5F (SHEATH) ×2 IMPLANT
KIT PV (KITS) ×2 IMPLANT
SHEATH PINNACLE 5F 10CM (SHEATH) ×2 IMPLANT
SYR MEDRAD MARK V 150ML (SYRINGE) ×2 IMPLANT
TRANSDUCER W/STOPCOCK (MISCELLANEOUS) ×2 IMPLANT
TRAY PV CATH (CUSTOM PROCEDURE TRAY) ×2 IMPLANT
WIRE BENTSON .035X145CM (WIRE) ×2 IMPLANT

## 2016-05-11 NOTE — H&P (View-Only) (Signed)
Postoperative Visit   History of Present Illness  [name] is a 50 y.o. male who is s/p R CIA PTA+S on 02/24/16 by Dr. Bridgett Larsson.    Pt returns today after phone call from pt's wife.  Reported c/o right calf pain with walking.  Reported the pt. stated the calf pain will ease after resting awhile.  Also stated he reported the toes of the right foot are slightly discolored, and have increased sensitivity, compared to the left foot.  Denied any swelling of the right LE.  He was walking 4-5 miles until 3-4 weeks ago when his right toes started tingling at rest; then about a week ago, after he went under his house to investigate a frozen pipe, his right thigh started cramping after walking about 15 yards.   Dr. Bridgett Larsson last evaluated pt on 04-07-16. At that time pt was asymptomatic.   The patient is able to complete their activities of daily living.    He states he does not have DM.  He states he has not had his cholesterol checked "in a while".    Past Medical History, Past Surgical History, Social History, Family History, Medications, Allergies, and Review of Systems are unchanged from previous evaluation on 02-24-16.  For VQI Use Only  PRE-ADM LIVING: Home  AMB STATUS: Ambulatory  Physical Examination  Vitals:   05/01/16 1155  BP: 135/76  Pulse: 87  Temp: 97.6 F (36.4 C)  SpO2: 100%  Weight: 192 lb (87.1 kg)   Body mass index is 28.35 kg/m.  PHYSICAL EXAMINATION: General: The patient appears their stated age.   HEENT:  No gross abnormalities Pulmonary: Respirations are non-labored, CTAB, adequate air movement in all fields Abdomen: Soft and non-tender. Musculoskeletal: There are no major deformities.   Neurologic: No focal weakness or paresthesias are detected Skin: There are no ulcer or rashes noted. Right toes are more pale and cool than left toes; right toes with sluggish capillary refill.  Psychiatric: The patient has normal affect. Cardiovascular: There is a  regular rate and rhythm without significant murmur appreciated.   Vascular: Vessel Right Left  Radial Palpable Palpable  Carotid Palpable, without bruit Palpable, without bruit  Aorta Not palpable N/A  Femoral Not Palpable Palpable  Popliteal Not palpable Not palpable  PT not Palpable  Palpable  DP not Palpable  Palpable    Medical Decision Making  [name] is a 50 y.o. male who presents s/p R CIA PTA+S on 02/24/16. He was walking 4-5 miles until 3-4 weeks ago when his right toes started tingling at rest; then about a week ago, after he went under his house to investigate a frozen pipe, his right thigh started cramping after walking about 15 yards.   Dr. Bridgett Larsson last evaluated pt on 04-07-16. At that time pt was asymptomatic.   DATA Right iliac artery stent duplex today: decreased visualization of the abdominal vasculature due to overlying bowel gas. Right CIA stent with no significant stenosis, monophasic waveforms within the stent. No previous post procedure duplex available for comparison.  ABI's today: right DP and digit waveforms are absent. Right PT with 0.61 ABI (was 1.08 on 03-30-16), triphasic waveforms. Left ABI remains normal at 1.25 with all triphasic waveforms, digit is normal at 1.03.   I discussed pt HPI and results of today's non invasive vascular lab findings with Dr. Trula Slade. Will schedule pt for arteriogram with bilateral run off, possible intervention with Dr. Bridgett Larsson on 05-08-16.  The patient was counseled re smoking  cessation and given several free resources re smoking cessation.  I discussed in depth with the patient the nature of atherosclerosis, and emphasized the importance of maximal medical management including strict control of blood pressure, blood glucose, and lipid levels, obtaining regular exercise, and cessation of smoking.  The patient is aware that without maximal medical management the underlying atherosclerotic disease process will progress, limiting  the benefit of any interventions. The patient is currently not on a statin. I advised pt to see his PCP as soon as possible since he states he has not had his cholesterol checked "in a while".  The patient is currently on an anti-platelet: Plavix.    Thank you for allowing [place] to participate in this patient's care.  NICKEL, Sharmon Leyden, RN, MSN, FNP-C [facility] [place] Office: [phone]  05/01/2016, 12:46 PM  Clinic MD: Trula Slade

## 2016-05-11 NOTE — Progress Notes (Signed)
Site area: LFA Site Prior to Removal:  Level 0 Pressure Applied For:25 min Manual:   yes Patient Status During Pull:  stable Post Pull Site:  Level 0 Post Pull Instructions Given: yes  Post Pull Pulses Present: palpable Dressing Applied:  tegaderm Bedrest begins @ [id number] till 1610 Comments:

## 2016-05-11 NOTE — Discharge Instructions (Signed)

## 2016-05-11 NOTE — Interval H&P Note (Signed)
[facility]  History and Physical Update  The patient was interviewed and re-examined.  The patient's previous History and Physical has been reviewed and is unchanged from my NP's consult.  There is no change in the plan of care: Aortogram, right leg runoff, and possible intervention.   I discussed with the patient the nature of angiographic procedures, especially the limited patencies of any endovascular intervention.    The patient is aware of that the risks of an angiographic procedure include but are not limited to: bleeding, infection, access site complications, renal failure, embolization, rupture of vessel, dissection, arteriovenous fistula, possible need for emergent surgical intervention, possible need for surgical procedures to treat the patient's pathology, anaphylactic reaction to contrast, and stroke and death.    The patient is aware of the risks and agrees to proceed. [name], MD [facility] [place] Office: [phone] Pager: [phone]  05/11/2016, 10:09 AM

## 2016-05-11 NOTE — Op Note (Signed)
OPERATIVE NOTE   PROCEDURE: 1.  Left common femoral artery cannulation under ultrasound guidance 2.  Placement of catheter in aorta 3.  Aortogram 4.  Conscious sedation for 30 minutes 5.  Bilateral leg runoff via catheter  PRE-OPERATIVE DIAGNOSIS: s/p R CIA stenting, marked decrease in right leg perfusion  POST-OPERATIVE DIAGNOSIS: same as above   SURGEON: [name], MD  ANESTHESIA: conscious sedation  ESTIMATED BLOOD LOSS: 30 cc  CONTRAST: 125 cc  FINDING(S):  Aorta: patent  Superior mesenteric artery: patent  Celiac artery: patent   Right Left  RA patent patent  CIA Occluded stent Patent  EIA Pelvic collateral reconstitute, patent patent  IIA patent patent  CFA patent patent  SFA patent   PFA patent   Pop patent   Trif patent   AT Distal washes out, unable to image   Pero Patent, distal washes out but distal branches faintly imaged   PT Patent, dominant runoff     SPECIMEN(S):  none  INDICATIONS:   [name] is a 50 y.o. male who presents with recurrent short distance claudication symptoms in right foot after resolution of his intermittent claudication symptoms after recent right common iliac artery stenting.  The patient presents for: aortogram, bilateral leg runoff, and possible intervention.  I discussed with the patient the nature of angiographic procedures, especially the limited patencies of any endovascular intervention.  The patient is aware of that the risks of an angiographic procedure include but are not limited to: bleeding, infection, access site complications, renal failure, embolization, rupture of vessel, dissection, possible need for emergent surgical intervention, possible need for surgical procedures to treat the patient's pathology, and stroke and death.  The patient is aware of the risks and agrees to proceed.  DESCRIPTION: After full informed consent was obtained from the patient, the patient was brought back to the  angiography suite.  The patient was placed supine upon the angiography table and connected to cardiopulmonary monitoring equipment.  The patient was then given conscious sedation, the amounts of which are documented in the patient's chart.  A circulating radiologic technician maintained continuous monitoring of the patient's cardiopulmonary status.  Additionally, the control room radiologic technician provided backup monitoring throughout the procedure.  The patient was prepped and drape in the standard fashion for an angiographic procedure.  At this point, attention was turned to the left groin.  Under ultrasound guidance, the subcutaneous tissue surrounding the left common femoral artery was anesthesized with 1% lidocaine with epinephrine.  The artery was then cannulated with a micropuncture needle.  The microwire was advanced into the iliac arterial system.  The needle was exchanged for a microsheath, which was loaded into the common femoral artery over the wire.  The microwire was exchanged for a [facility] wire which was advanced into the aorta.  The microsheath was then exchanged for a 5-Fr sheath which was loaded into the common femoral artery.  The Omniflush catheter was then loaded over the wire up to the level of L1.  The catheter was connected to the power injector circuit.  After de-airring and de-clotting the circuit, a power injector aortogram was completed.  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  I replaced the wire into the catheter, straightening out the crook in the catheter.  Both were removed from the sheath together.  The sheath was aspirated.  No clots were present and the sheath was reloaded with heparinized saline.  I pulled the catheter down to the distal aorta.  I completed an automated bilateral leg runoff.  The findings are listed above.  Based on the images, I would obtain preoperative evaluation in  preparation for a left to right femorofemoral bypass.   COMPLICATIONS: none  CONDITION: stable   [name], MD, [facility] [place] Office: [phone] Pager: [phone]  05/11/2016, 11:19 AM

## 2016-05-12 ENCOUNTER — Encounter (HOSPITAL_COMMUNITY): Payer: Self-pay | Admitting: Vascular Surgery

## 2016-05-18 NOTE — Progress Notes (Signed)
[name] [place], [place] [address] Phone: [phone] Subjective:    I'm seeing this patient by the request  of:    CC: Right knee pain   [id number]  [name] is a 50 y.o. male coming in with complaint of right knee pain. Patient unfortunately has had recently and peripheral vascular disease with catheterization and stenting that seems to have failed her easily. Is scheduled to have a bypassed next month. Patient states Unfortunately he was crawling under his house in had his knee get locked. States that he had severe amount of pain. This was a couple weeks ago. Has not improved. Has increasing instability recently. Patient feels like it is getting worse overall. Denies any fevers or chills at this time but did have a recent catheterization. Patient states that he feels like it is going to give out on him at any time. Positive for swelling. No radiation of pain and no numbness.     No past medical history on file. Past Surgical History:  Procedure Laterality Date  . KNEE SURGERY    . neck surger    . NECK SURGERY    . PERIPHERAL VASCULAR CATHETERIZATION N/A 02/24/2016   Procedure: Abdominal Aortogram w/Lower Extremity;  Surgeon: Conrad Haynes, MD;  Location: [facility];  Service: Cardiovascular;  Laterality: N/A;  . PERIPHERAL VASCULAR CATHETERIZATION Right 02/24/2016   Procedure: Peripheral Vascular Intervention;  Surgeon: Conrad Addison, MD;  Location: [facility];  Service: Cardiovascular;  Laterality: Right;  Common iliac  . PERIPHERAL VASCULAR CATHETERIZATION N/A 05/11/2016   Procedure: Abdominal Aortogram;  Surgeon: Conrad Rosalia, MD;  Location: [facility];  Service: Cardiovascular;  Laterality: N/A;  . PERIPHERAL VASCULAR CATHETERIZATION N/A 05/11/2016   Procedure: Lower Extremity Angiography;  Surgeon: Conrad Lakeland, MD;  Location: [facility];  Service: Cardiovascular;  Laterality: N/A;   Social  History   Social History  . Marital status: Married    Spouse name: N/A  . Number of children: N/A  . Years of education: N/A   Social History Main Topics  . Smoking status: Current Every Day Smoker  . Smokeless tobacco: Never Used  . Alcohol use None  . Drug use: Unknown  . Sexual activity: Not Asked   Other Topics Concern  . None   Social History Narrative  . None   No Known Allergies Family History  Problem Relation Age of Onset  . Cancer Father     Past medical history, social, surgical and family history all reviewed in electronic medical record.  No pertanent information unless stated regarding to the chief complaint.   Review of Systems: No headache, visual changes, nausea, vomiting, diarrhea, constipation, dizziness, abdominal pain, skin rash, fevers, chills, night sweats, weight loss, swollen lymph nodes, chest pain, shortness of breath, mood changes.    Objective  Blood pressure 128/72, height 5\' 9"  (1.753 m), weight 193 lb (87.5 kg). Systems examined below as of 05/19/16   General: No apparent distress alert and oriented x3 mood and affect normal, dressed appropriately.  HEENT: Pupils equal, extraocular movements intact  Respiratory: Patient's speak in full sentences and does not appear short of breath  Cardiovascular: No lower extremity edema, non tender, no erythema  Skin: Warm dry intact with no signs of infection or rash on extremities or on axial skeleton.  Abdomen: Soft nontender  Neuro: Cranial nerves II through XII are intact, neurovascularly intact in all  extremities with 2+ DTRs and S1 distal pulse on the right lower cavity.  Lymph: No lymphadenopathy of posterior or anterior cervical chain or axillae bilaterally.  Gait Severely antalgic gait.  MSK:  Non tender with full range of motion and good stability and symmetric strength and tone of shoulders, elbows, wrist, hip, and ankles bilaterally.  Knee: Right Large knee effusion noted Moderate  tenderness diffusely. Lacking the last 10 of flexion Significant instability of the LCL. Positive Mcmurray's, Apley's, and Thessalonian tests. Patellar and quadriceps tendons unremarkable. Hamstring and quadriceps strength is normal.  Contralateral knee unremarkable.  MSK US performed of: Right knee This study was ordered, performed, and interpreted by Charlann Boxer D.O.  Knee: Large knee effusion noted. Synovitis noted as well. In addition of this patient also has what appears to be medial and lateral meniscal tears. LCL seems to have a complete rupture off of the femoral condyle. Impression: LCL rupture meniscal injuries as well as large knee effusion  Procedure: Real-time Ultrasound Guided Injection of right knee Device: GE Logiq E  Ultrasound guided injection is preferred based studies that show increased duration, increased effect, greater accuracy, decreased procedural pain, increased response rate, and decreased cost with ultrasound guided versus blind injection.  Verbal informed consent obtained.  Time-out conducted.  Noted no overlying erythema, induration, or other signs of local infection.  Skin prepped in a sterile fashion.  Local anesthesia: Topical Ethyl chloride.  With sterile technique and under real time ultrasound guidance: With a 22-gauge 2 inch needle patient was injected with 2 cc of 0.5% Marcaine and then aspirated 43 mL of strawlike fluid that had a cloudy appearance then injected 1 cc of Kenalog 40 mg/dL. This was from a superior lateral approach.  Completed without difficulty  Pain immediately resolved suggesting accurate placement of the medication.  Advised to call if fevers/chills, erythema, induration, drainage, or persistent bleeding.  Images permanently stored and available for review in the ultrasound unit.  Impression: Technically successful ultrasound guided injection.    Impression and Recommendations:     This case required medical decision making  of moderate complexity.      Note: This dictation was prepared with Dragon dictation along with smaller phrase technology. Any transcriptional errors that result from this process are unintentional.

## 2016-05-19 ENCOUNTER — Ambulatory Visit (INDEPENDENT_AMBULATORY_CARE_PROVIDER_SITE_OTHER)
Admission: RE | Admit: 2016-05-19 | Discharge: 2016-05-19 | Disposition: A | Discharge status: Home / Self Care | Payer: 59 | Source: Ambulatory Visit | Attending: Family Medicine | Admitting: Family Medicine

## 2016-05-19 ENCOUNTER — Other Ambulatory Visit: Payer: 59

## 2016-05-19 ENCOUNTER — Encounter: Payer: Self-pay | Admitting: Family Medicine

## 2016-05-19 ENCOUNTER — Ambulatory Visit: Payer: Self-pay

## 2016-05-19 ENCOUNTER — Ambulatory Visit (INDEPENDENT_AMBULATORY_CARE_PROVIDER_SITE_OTHER): Payer: 59 | Admitting: Family Medicine

## 2016-05-19 VITALS — BP 128/72 | Ht 69.0 in | Wt 193.0 lb

## 2016-05-19 DIAGNOSIS — M25461 Effusion, right knee: Secondary | ICD-10-CM | POA: Insufficient documentation

## 2016-05-19 DIAGNOSIS — M2351 Chronic instability of knee, right knee: Secondary | ICD-10-CM | POA: Diagnosis not present

## 2016-05-19 DIAGNOSIS — M25561 Pain in right knee: Secondary | ICD-10-CM

## 2016-05-19 MED ORDER — TRAMADOL HCL 50 MG PO TABS: 50.0000 mg | ORAL_TABLET | Freq: Two times a day (BID) | ORAL | 0 refills | Status: DC | PRN

## 2016-05-19 MED FILL — traMADol HCL 50 MG TABS: 50 | 15 days supply | Qty: 30 | Fill #0

## 2016-05-19 NOTE — Assessment & Plan Note (Signed)
Patient did have a large effusion of the knee and did have any significant instability. On ultrasound it appears that there is an LCL tear as well as meniscal injuries. Effusion did have darkening of the strawlike colored fluid. Sent to labs for further evaluation. She recently did have a peripheral catheterization done the patient has had no fevers chills and there was no signs of any type of cellulitis. Depending on labs and how patient feels her back again in 2-3 weeks. X-rays are pending to further evaluate the amount of arthritis. Depending on the arthritis this could allow [place] to know if we need an MRI for further evaluation to see if he would be a candidate for an arthroscopic procedure versus potential knee replacement.

## 2016-05-19 NOTE — Patient Instructions (Addendum)
Well you will do well overall.  We injected the knee today  Xray today downstairs Ice 20 minutes 2 times daily. Usually after activity and before bed. Wear brace with any walking.  We will talk.  Happy New Year!

## 2016-05-20 LAB — SYNOVIAL CELL COUNT + DIFF, W/ CRYSTALS
BASOPHILS, %: 0 %
Eosinophils-Synovial: 0 % (ref 0–2)
Lymphocytes-Synovial Fld: 2 % (ref 0–74)
Monocyte/Macrophage: 13 % (ref 0–69)
NEUTROPHIL, SYNOVIAL: 85 % — AB (ref 0–24)
Synoviocytes, %: 0 % (ref 0–15)
WBC, SYNOVIAL: 7470 {cells}/uL — AB (ref <150)

## 2016-05-20 LAB — URIC ACID, SYNOVIAL FLUID: Uric Acid, Synovial Fluid: 5.1 mg/dL (ref 0.0–8.0)

## 2016-05-21 MED ORDER — DOXYCYCLINE HYCLATE 100 MG PO TABS: 100.0000 mg | ORAL_TABLET | Freq: Two times a day (BID) | ORAL | 0 refills | Status: AC

## 2016-05-21 NOTE — Progress Notes (Signed)
Sent in doxy due to lab results Unable to get through to patient./

## 2016-05-22 DIAGNOSIS — D500 Iron deficiency anemia secondary to blood loss (chronic): Secondary | ICD-10-CM

## 2016-05-22 DIAGNOSIS — Z95828 Presence of other vascular implants and grafts: Secondary | ICD-10-CM

## 2016-05-22 HISTORY — DX: Presence of other vascular implants and grafts: Z95.828

## 2016-05-22 HISTORY — DX: Iron deficiency anemia secondary to blood loss (chronic): D50.0

## 2016-05-23 LAB — BODY FLUID CULTURE
GRAM STAIN: NONE SEEN
ORGANISM ID, BACTERIA: NO GROWTH

## 2016-05-23 MED FILL — DOXYCYCLINE HYCLATE 100 MG: 100 | 10 days supply | Qty: 20 | Fill #0

## 2016-05-26 ENCOUNTER — Ambulatory Visit: Payer: 59 | Admitting: Vascular Surgery

## 2016-06-02 ENCOUNTER — Encounter: Payer: Self-pay | Admitting: Internal Medicine

## 2016-06-02 ENCOUNTER — Ambulatory Visit: Payer: 59 | Admitting: Internal Medicine

## 2016-06-02 ENCOUNTER — Ambulatory Visit (INDEPENDENT_AMBULATORY_CARE_PROVIDER_SITE_OTHER): Payer: 59 | Admitting: Internal Medicine

## 2016-06-02 VITALS — BP 110/76 | HR 70 | Ht 69.0 in | Wt 185.6 lb

## 2016-06-02 DIAGNOSIS — R079 Chest pain, unspecified: Secondary | ICD-10-CM | POA: Diagnosis not present

## 2016-06-02 DIAGNOSIS — I8290 Acute embolism and thrombosis of unspecified vein: Secondary | ICD-10-CM

## 2016-06-02 DIAGNOSIS — R0602 Shortness of breath: Secondary | ICD-10-CM | POA: Diagnosis not present

## 2016-06-02 DIAGNOSIS — Z79899 Other long term (current) drug therapy: Secondary | ICD-10-CM

## 2016-06-02 DIAGNOSIS — I70211 Atherosclerosis of native arteries of extremities with intermittent claudication, right leg: Secondary | ICD-10-CM

## 2016-06-02 DIAGNOSIS — I779 Disorder of arteries and arterioles, unspecified: Secondary | ICD-10-CM | POA: Insufficient documentation

## 2016-06-02 DIAGNOSIS — I739 Peripheral vascular disease, unspecified: Secondary | ICD-10-CM

## 2016-06-02 MED ORDER — VARENICLINE TARTRATE 1 MG PO TABS: 1.0000 mg | ORAL_TABLET | Freq: Two times a day (BID) | ORAL | 1 refills | Status: DC

## 2016-06-02 MED ORDER — VARENICLINE TARTRATE 0.5 MG X 11 & 1 MG X 42 PO MISC
ORAL | 0 refills | Status: DC
Start: 2016-06-02 — End: 2016-07-12

## 2016-06-02 NOTE — Progress Notes (Signed)
OFFICE NOTE  Chief Complaint:  Right leg pain  Primary Care Physician: PROVIDER NOT IN SYSTEM  HPI:  [name] is a 51 y.o. male who presents at the request of his vascular surgeon for coronary evaluation. He recently was noted to have claudication of lower extremities and underwent ABIs which indicated a reduced ABI of 0.8 on the right and TBI of 0.47. 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 He subsequently underwent right common iliac artery stenting. Afterwards he noted marked improvement in his right leg pain. Subsequent to that he had an acute presentation of right leg pain and was again brought back for angiography on 05/11/2016. The previously placed common iliac stent was occluded and then retreated. No obvious etiology for this was found. It was thought that a cardio embolic episode could be responsible for this, however in my experience this is extremely rare for clot to travel from the ventricle and large and a patent stent in the common iliac artery. What would be more likely could be edge dissection or perhaps underexpansion of the stent or even noncompliance with antiplatelet medications. A clotting disorder has been postulated however seems unlikely given his age and the fact that he's had no prior clotting history, however it should be considered. In addition he's never had a cholesterol test and likely has some elevated cholesterol. From a coronary standpoint he reports after being questioned that he has recently had some chest discomfort, worsening shortness of breath and fatigue which he seemed to pass off to other issues. He is a smoker and has not yet been able to quit. He does seem to be interested in smoking cessation. He also reported that he recently traumatized his right knee and is wearing a brace. Findings indicate that he might need to have total knee  replacement in the near future.  PMHx:  No past medical history on file.  Past Surgical History:  Procedure Laterality Date  . KNEE SURGERY    . neck surger    . NECK SURGERY    . PERIPHERAL VASCULAR CATHETERIZATION N/A 02/24/2016   Procedure: Abdominal Aortogram w/Lower Extremity;  Surgeon: Conrad Opelika, MD;  Location: [facility];  Service: Cardiovascular;  Laterality: N/A;  . PERIPHERAL VASCULAR CATHETERIZATION Right 02/24/2016   Procedure: Peripheral Vascular Intervention;  Surgeon: Conrad Russia, MD;  Location: [facility];  Service: Cardiovascular;  Laterality: Right;  Common iliac  . PERIPHERAL VASCULAR CATHETERIZATION N/A 05/11/2016   Procedure: Abdominal Aortogram;  Surgeon: Conrad Parshall, MD;  Location: [facility];  Service: Cardiovascular;  Laterality: N/A;  . PERIPHERAL VASCULAR CATHETERIZATION N/A 05/11/2016   Procedure: Lower Extremity Angiography;  Surgeon: Conrad Wickliffe, MD;  Location: [facility];  Service: Cardiovascular;  Laterality: N/A;    FAMHx:  Family History  Problem Relation Age of Onset  . Cancer Father     SOCHx:   reports that he has been smoking.  He has never used smokeless tobacco. His alcohol and drug histories are not on file.  ALLERGIES:  No Known Allergies  ROS: Pertinent items noted in HPI and remainder of comprehensive ROS otherwise negative.  HOME MEDS: Current Outpatient Prescriptions on File Prior to Visit  Medication Sig Dispense Refill  . clopidogrel (PLAVIX) 75 MG tablet Take 1 tablet (75 mg total) by mouth daily. 30 tablet 11  . esomeprazole (NEXIUM) 20  MG capsule Take 20 mg by mouth daily at 12 noon.    . gabapentin (NEURONTIN) 100 MG capsule Take 2 capsules (200 mg total) by mouth 3 (three) times daily. (Patient taking differently: Take 200 mg by mouth at bedtime. ) 180 capsule 3  . ibuprofen (ADVIL,MOTRIN) 200 MG tablet Take 400 mg by mouth every 6 (six) hours as needed for headache.    . ranitidine  (ZANTAC) 150 MG tablet Take 150 mg by mouth daily as needed for heartburn.    . traMADol (ULTRAM) 50 MG tablet Take 1 tablet (50 mg total) by mouth every 12 (twelve) hours as needed. 30 tablet 0   No current facility-administered medications on file prior to visit.     LABS/IMAGING: No results found for this or any previous visit (from the past 48 hour(s)). No results found.  WEIGHTS: Wt Readings from Last 3 Encounters:  06/02/16 185 lb 9.6 oz (84.2 kg)  05/19/16 193 lb (87.5 kg)  05/11/16 191 lb (86.6 kg)    VITALS: BP 110/76   Pulse 70   Ht 5\' 9"  (1.753 m)   Wt 185 lb 9.6 oz (84.2 kg)   BMI 27.41 kg/m   EXAM: General appearance: alert and no distress Neck: no carotid bruit and no JVD Lungs: diminished breath sounds bibasilar Heart: regular rate and rhythm Abdomen: soft, non-tender; bowel sounds normal; no masses,  no organomegaly Extremities: Right lower extremity is cool with bluish discoloration of the toes and decreased distal pulses Pulses: Faint right lower extremity and intact left lower strandy pulse Skin: Skin color, texture, turgor normal. No rashes or lesions Neurologic: Grossly normal Psych: Pleasant  EKG: Normal sinus rhythm at 70  ASSESSMENT: 1. PAD with lifestyle limiting claudication status post right CIA stent 2. 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 Chest pain, progressive dyspnea and fatigue 4. Right knee injury 5. Tobacco abuse  PLAN: 1.   [name] has PAD with recent claudication and significantly reduced right ABI status post right CIA stent with early stent thrombosis. It was thought that this could be due to cardio embolization however that is extremely unlikely. It's more likely that he may have been noncompliant with his antiplatelet medications or there was some technical difficulty with the stent. It was presented that he may need a femoral-popliteal bypass in the right. Prior to that a cardiovascular evaluation is warranted. He  is having chest pain, dyspnea and fatigue concerning for unstable angina. I would recommend a Lexiscan Myoview to evaluate for ischemia and he may ultimately need heart catheterization. Smoking cessation is extremely important particular if he were to undergo surgery and if he needs surgical evaluation of his right knee. He is telling me that he may need right knee replacement. I think we need to sort out his cardiovascular needs first. He is interested in smoking cessation today after a long conversation and interested in starting Chantix. I think it's extremely like unlikely that he had an arterial thrombus due to clotting disorder however we'll go ahead and order clotting studies today especially since he's considering femoropopliteal bypass and/or knee surgery both of which may be prothrombotic. We'll also assess a cholesterol profile and may likely start him on statin therapy as it's indicated given his PAD.  Follow-up with me afterwards.  Pixie Casino, MD, [facility] Attending Cardiologist Irrigon C Rashaud Ybarbo 06/02/2016, 4:54 PM

## 2016-06-02 NOTE — Patient Instructions (Signed)
Your physician recommends that you return for lab work in: FASTING -- lipid, CMET, clotting disorder labs  Your physician has requested that you have a [place]. For further information please visit [email or web address]. Please follow instruction sheet, as given.  Your physician has requested that you have an echocardiogram @ [address] - [address]rd [address]. Echocardiography is a painless test that uses sound waves to create images of your heart. It provides your doctor with information about the size and shape of your heart and how well your heart's chambers and valves are working. This procedure takes approximately one hour. There are no restrictions for this procedure.  Your physician recommends that you schedule a follow-up appointment after your testing.

## 2016-06-05 MED FILL — CHANTIX 1 MG CONT MONTH BOX: 1 | 28 days supply | Qty: 56 | Fill #0

## 2016-06-05 MED FILL — CHANTIX STARTING MONTH BOX: 0.5 MG X 11 | 28 days supply | Qty: 53 | Fill #0

## 2016-06-06 ENCOUNTER — Telehealth (HOSPITAL_COMMUNITY): Payer: Self-pay

## 2016-06-06 NOTE — Telephone Encounter (Signed)
Encounter complete. 

## 2016-06-08 ENCOUNTER — Inpatient Hospital Stay (HOSPITAL_COMMUNITY): Admission: RE | Admit: 2016-06-08 | Payer: 59 | Source: Ambulatory Visit

## 2016-06-09 ENCOUNTER — Ambulatory Visit: Payer: 59 | Admitting: Vascular Surgery

## 2016-06-09 ENCOUNTER — Ambulatory Visit: Payer: 59 | Admitting: Family Medicine

## 2016-06-13 ENCOUNTER — Encounter (HOSPITAL_COMMUNITY): Payer: 59

## 2016-06-13 ENCOUNTER — Telehealth: Payer: Self-pay | Admitting: Internal Medicine

## 2016-06-13 ENCOUNTER — Telehealth (HOSPITAL_COMMUNITY): Payer: Self-pay

## 2016-06-13 NOTE — Telephone Encounter (Signed)
Encounter Complete.  

## 2016-06-13 NOTE — Telephone Encounter (Signed)
Returned call to patient advised not to vape before stress test.

## 2016-06-13 NOTE — Telephone Encounter (Signed)
New Message     Pt is having a Stress test done 06/14/16 is he allow to Vape if it contains no nicotine?

## 2016-06-14 ENCOUNTER — Ambulatory Visit (HOSPITAL_COMMUNITY)
Admission: RE | Admit: 2016-06-14 | Discharge: 2016-06-14 | Disposition: A | Discharge status: Home / Self Care | Payer: 59 | Source: Ambulatory Visit | Attending: Cardiovascular Disease | Admitting: Cardiovascular Disease

## 2016-06-14 DIAGNOSIS — R079 Chest pain, unspecified: Secondary | ICD-10-CM | POA: Insufficient documentation

## 2016-06-14 DIAGNOSIS — R0602 Shortness of breath: Secondary | ICD-10-CM | POA: Diagnosis not present

## 2016-06-14 LAB — MYOCARDIAL PERFUSION IMAGING
CHL CUP NUCLEAR SDS: 1
CHL CUP RESTING HR STRESS: 68 {beats}/min
CSEPPHR: 107 {beats}/min
LV dias vol: 145 mL (ref 62–150)
LVSYSVOL: 81 mL
SRS: 2
SSS: 3
TID: 1.15

## 2016-06-14 MED ORDER — TECHNETIUM TC 99M TETROFOSMIN IV KIT
10.3000 | PACK | Freq: Once | INTRAVENOUS | Status: AC | PRN
  Administered 2016-06-14: 10.3 via INTRAVENOUS
  Filled 2016-06-14: qty 11

## 2016-06-14 MED ORDER — TECHNETIUM TC 99M TETROFOSMIN IV KIT
32.0000 | PACK | Freq: Once | INTRAVENOUS | Status: AC | PRN
  Administered 2016-06-14: 32 via INTRAVENOUS
  Filled 2016-06-14: qty 32

## 2016-06-14 MED ORDER — REGADENOSON 0.4 MG/5ML IV SOLN
0.4000 mg | Freq: Once | INTRAVENOUS | Status: AC
  Administered 2016-06-14: 0.4 mg via INTRAVENOUS

## 2016-06-19 ENCOUNTER — Encounter: Payer: Self-pay | Admitting: Vascular Surgery

## 2016-06-19 MED FILL — GABAPENTIN 100 MG CAPSULE: 100 | 30 days supply | Qty: 180 | Fill #3

## 2016-06-19 MED FILL — CLOPIDOGREL 75 MG TABLET: 75 | 30 days supply | Qty: 30 | Fill #2

## 2016-06-20 ENCOUNTER — Ambulatory Visit (HOSPITAL_COMMUNITY): Payer: 59 | Attending: Cardiovascular Disease

## 2016-06-20 ENCOUNTER — Other Ambulatory Visit: Payer: 59

## 2016-06-20 ENCOUNTER — Other Ambulatory Visit: Payer: Self-pay

## 2016-06-20 DIAGNOSIS — R0602 Shortness of breath: Secondary | ICD-10-CM | POA: Insufficient documentation

## 2016-06-20 DIAGNOSIS — Z79899 Other long term (current) drug therapy: Secondary | ICD-10-CM | POA: Diagnosis not present

## 2016-06-20 DIAGNOSIS — R079 Chest pain, unspecified: Secondary | ICD-10-CM | POA: Diagnosis not present

## 2016-06-20 DIAGNOSIS — I8290 Acute embolism and thrombosis of unspecified vein: Secondary | ICD-10-CM | POA: Diagnosis not present

## 2016-06-20 DIAGNOSIS — I70211 Atherosclerosis of native arteries of extremities with intermittent claudication, right leg: Secondary | ICD-10-CM | POA: Diagnosis not present

## 2016-06-21 LAB — COMPREHENSIVE METABOLIC PANEL
ALT: 16 U/L (ref 9–46)
AST: 20 U/L (ref 10–40)
Albumin: 4.3 g/dL (ref 3.6–5.1)
Alkaline Phosphatase: 82 U/L (ref 40–115)
BILIRUBIN TOTAL: 0.9 mg/dL (ref 0.2–1.2)
BUN: 11 mg/dL (ref 7–25)
CALCIUM: 9.7 mg/dL (ref 8.6–10.3)
CHLORIDE: 103 mmol/L (ref 98–110)
CO2: 25 mmol/L (ref 20–31)
Creat: 1.01 mg/dL (ref 0.60–1.35)
GLUCOSE: 101 mg/dL — AB (ref 65–99)
Potassium: 4 mmol/L (ref 3.5–5.3)
SODIUM: 139 mmol/L (ref 135–146)
Total Protein: 7.7 g/dL (ref 6.1–8.1)

## 2016-06-21 LAB — LIPID PANEL
CHOL/HDL RATIO: 5.3 ratio — AB (ref <5.0)
Cholesterol: 149 mg/dL (ref <200)
HDL: 28 mg/dL — ABNORMAL LOW (ref >40)
LDL Cholesterol: 89 mg/dL (ref <100)
Triglycerides: 159 mg/dL — ABNORMAL HIGH (ref <150)
VLDL: 32 mg/dL — AB (ref <30)

## 2016-06-21 NOTE — Progress Notes (Signed)
Established Intermittent Claudication  History of Present Illness  [name] is a 51 y.o. (1966-05-20) male who presents with chief complaint: short distance intermittent claudication.  The patient's symptoms have not progressed.  The patient's symptoms are: short distance intermittent claudication.  The patient had undergone R CIA stenting of a CTO on 02/24/16 but the stent occluded on subsequent angiogram on 05/11/16.  I recommended we proceed with fem-fem bypass to alievate his R common iliac artery occlusion. 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  The patient's treatment regimen currently included: maximal medical management.  Dr. Debara Pickett has been seeing this patient for pre-op clearance.  This patient is low risk at this point.  He returns for preoperative counseling.   No past medical history on file.  Past Surgical History:  Procedure Laterality Date  . KNEE SURGERY    . neck surger    . NECK SURGERY    . PERIPHERAL VASCULAR CATHETERIZATION N/A 02/24/2016   Procedure: Abdominal Aortogram w/Lower Extremity;  Surgeon: Conrad Bellows Falls, MD;  Location: [facility];  Service: Cardiovascular;  Laterality: N/A;  . PERIPHERAL VASCULAR CATHETERIZATION Right 02/24/2016   Procedure: Peripheral Vascular Intervention;  Surgeon: Conrad Bonanza Hills, MD;  Location: [facility];  Service: Cardiovascular;  Laterality: Right;  Common iliac  . PERIPHERAL VASCULAR CATHETERIZATION N/A 05/11/2016   Procedure: Abdominal Aortogram;  Surgeon: Conrad Catasauqua, MD;  Location: [facility];  Service: Cardiovascular;  Laterality: N/A;  . PERIPHERAL VASCULAR CATHETERIZATION N/A 05/11/2016   Procedure: Lower Extremity Angiography;  Surgeon: Conrad Graceville, MD;  Location: [facility];  Service: Cardiovascular;  Laterality: N/A;    Social History   Social History  . Marital status: Married    Spouse name: N/A  .  Number of children: N/A  . Years of education: N/A   Occupational History  . Not on file.   Social History Main Topics  . Smoking status: Current Every Day Smoker    Types: E-cigarettes  . Smokeless tobacco: Never Used     Comment: 1/2 pk per day.  Vaporizor has 0% nicotine.   . Alcohol use Not on file  . Drug use: Unknown  . Sexual activity: Not on file   Other Topics Concern  . Not on file   Social History Narrative  . No narrative on file    Family History  Problem Relation Age of Onset  . Cancer Father     Current Outpatient Prescriptions  Medication Sig Dispense Refill  . clopidogrel (PLAVIX) 75 MG tablet Take 1 tablet (75 mg total) by mouth daily. 30 tablet 11  . esomeprazole (NEXIUM) 20 MG capsule Take 20 mg by mouth daily at 12 noon.    . gabapentin (NEURONTIN) 100 MG capsule Take 2 capsules (200 mg total) by mouth 3 (three) times daily. (Patient taking differently: Take 200 mg by mouth at bedtime. ) 180 capsule 3  . ibuprofen (ADVIL,MOTRIN) 200 MG tablet Take 400 mg by mouth every 6 (six) hours as needed for headache.    . ranitidine (ZANTAC) 150 MG tablet Take 150 mg by mouth daily as needed for heartburn.    . varenicline (CHANTIX CONTINUING MONTH PAK) 1 MG tablet Take 1 tablet (1 mg total) by mouth 2 (two) times daily. 60 tablet 1  . varenicline (CHANTIX STARTING MONTH PAK) 0.5 MG X 11 & 1 MG X  42 tablet Take as directed per package instructions. 53 tablet 0   No current facility-administered medications for this visit.      No Known Allergies   REVIEW OF SYSTEMS:  (Positives checked otherwise negative)  CARDIOVASCULAR:   [ ]  chest pain,  [ ]  chest pressure,  [ ]  palpitations,  [ ]  shortness of breath when laying flat,  [ ]  shortness of breath with exertion,   [x]  pain in feet when walking,  [ ]  pain in feet when laying flat, [ ]  history of blood clot in veins (DVT),  [ ]  history of phlebitis,  [ ]  swelling in legs,  [ ]  varicose veins  PULMONARY:    [ ]  productive cough,  [ ]  asthma,  [ ]  wheezing  NEUROLOGIC:   [ ]  weakness in arms or legs,  [ ]  numbness in arms or legs,  [ ]  difficulty speaking or slurred speech,  [ ]  temporary loss of vision in one eye,  [ ]  dizziness  HEMATOLOGIC:   [ ]  bleeding problems,  [ ]  problems with blood clotting too easily  MUSCULOSKEL:   [ ]  joint pain, [ ]  joint swelling  GASTROINTEST:   [ ]  vomiting blood,  [ ]  blood in stool     GENITOURINARY:   [ ]  burning with urination,  [ ]  blood in urine  PSYCHIATRIC:   [ ]  history of major depression  INTEGUMENTARY:   [ ]  rashes,  [ ]  ulcers  CONSTITUTIONAL:   [ ]  fever,  [ ]  chills     Physical Examination  Vitals:   06/23/16 1557  BP: 116/72  Pulse: 70  Resp: 16  Temp: 97.2 F (36.2 C)  TempSrc: Oral  SpO2: 100%  Weight: 191 lb (86.6 kg)  Height: 5\' 9"  (1.753 m)   Body mass index is 28.21 kg/m.  General: Alert, O x 3, WD,NAD  Pulmonary: Sym exp, good B air movt,CTA B  Cardiac: RRR, Nl S1, S2, no Murmurs, No rubs, No S3,S4  Vascular: Vessel Right Left  Radial Palpable Palpable  Brachial Palpable Palpable  Carotid Palpable, No Bruit Palpable, No Bruit  Aorta Not palpable N/A  Femoral Faintly palpable Palpable  Popliteal Not palpable Not palpable  PT Not palpable Palpable  DP Not palpable Palpable   Gastrointestinal: soft, non-distended, non-tender to palpation, No guarding or rebound, no HSM, no masses, no CVAT B, No palpable prominent aortic pulse,    Musculoskeletal: M/S 5/5 throughout  , Extremities without ischemic changes , No edema present, R knee brace in place  Neurologic: CN 2-12 intact , Pain and light touch intact in extremities , Motor exam as listed above   Medical Decision Making  [name] is a 51 y.o. male who presents with:  right leg intermittent claudication without evidence of critical limb ischemia.   It remains unclear the etiology of the R CIA stent occlusion.  During the  [place] of the R CIA CTO, I discovered evidence of prior possible dissection, so I wonder if there remained a dissection flap that was not evident on completion angiogram that caused the reocclusion.  Based on the patient's vascular studies and examination, I have offered the patient: Left to right femorofemoral bypass The risk, benefits, and alternative for bypass operations were discussed with the patient.   The patient is aware the risks include but are not limited to: bleeding, infection, myocardial infarction, stroke, limb loss, nerve damage, need for additional procedures in the future, wound complications,  and inability to complete the bypass.  The patient is aware of these risks and agreed to proceed.  I discussed in depth with the patient the nature of atherosclerosis, and emphasized the importance of maximal medical management including strict control of blood pressure, blood glucose, and lipid levels, antiplatelet agents, obtaining regular exercise, and cessation of smoking.    The patient is aware that without maximal medical management the underlying atherosclerotic disease process will progress, limiting the benefit of any interventions. The patient is currently not on a statin as lipid panel pending. The patient is currently on an anti-platelet: Plavix.  Thank you for allowing [place] to participate in this patient's care.   [name], MD, FACS [facility] Office: [phone] Pager: [phone]

## 2016-06-22 LAB — APC RESISTANCE: Activated Protein C Resistance: 4.3 ratio (ref >=2.1)

## 2016-06-23 ENCOUNTER — Encounter: Payer: Self-pay | Admitting: Vascular Surgery

## 2016-06-23 ENCOUNTER — Ambulatory Visit (INDEPENDENT_AMBULATORY_CARE_PROVIDER_SITE_OTHER): Payer: 59 | Admitting: Vascular Surgery

## 2016-06-23 VITALS — BP 116/72 | HR 70 | Temp 97.2°F | Resp 16 | Ht 69.0 in | Wt 191.0 lb

## 2016-06-23 DIAGNOSIS — I70211 Atherosclerosis of native arteries of extremities with intermittent claudication, right leg: Secondary | ICD-10-CM | POA: Diagnosis not present

## 2016-06-23 LAB — ANTITHROMBIN III: AntiThromb III Func: 118 % activity (ref 80–120)

## 2016-06-25 LAB — ANTIPHOSPHOLOPID AB PANEL
Anticardiolipin IgG: <14 [GPL'U]
BETA-2-GLYCOPROTEIN I IGA: 11 SAU (ref <=20)
Beta-2 Glyco I IgG: 150 SGU — ABNORMAL HIGH (ref <=20)
Beta-2-Glycoprotein I IgM: 10 SMU (ref <=20)
Phosphatidylserine IgG Autoantibodies: <10 U/mL (ref <10)
Phosphatydalserine, IgM: <25 U/mL (ref <25)

## 2016-06-27 ENCOUNTER — Ambulatory Visit (INDEPENDENT_AMBULATORY_CARE_PROVIDER_SITE_OTHER): Payer: 59 | Admitting: Internal Medicine

## 2016-06-27 ENCOUNTER — Encounter: Payer: Self-pay | Admitting: Internal Medicine

## 2016-06-27 VITALS — BP 104/58 | HR 76 | Ht 70.0 in | Wt 190.0 lb

## 2016-06-27 DIAGNOSIS — Z01810 Encounter for preprocedural cardiovascular examination: Secondary | ICD-10-CM | POA: Insufficient documentation

## 2016-06-27 DIAGNOSIS — R760 Raised antibody titer: Secondary | ICD-10-CM

## 2016-06-27 DIAGNOSIS — I739 Peripheral vascular disease, unspecified: Secondary | ICD-10-CM

## 2016-06-27 DIAGNOSIS — E782 Mixed hyperlipidemia: Secondary | ICD-10-CM | POA: Insufficient documentation

## 2016-06-27 DIAGNOSIS — E785 Hyperlipidemia, unspecified: Secondary | ICD-10-CM

## 2016-06-27 DIAGNOSIS — I8290 Acute embolism and thrombosis of unspecified vein: Secondary | ICD-10-CM

## 2016-06-27 HISTORY — DX: Raised antibody titer: R76.0

## 2016-06-27 LAB — PROTHROMBIN GENE MUTATION

## 2016-06-27 LAB — FACTOR 5 LEIDEN

## 2016-06-27 MED ORDER — ROSUVASTATIN CALCIUM 5 MG PO TABS: 5.0000 mg | ORAL_TABLET | Freq: Every day | ORAL | 5 refills | Status: DC

## 2016-06-27 MED FILL — ROSUVASTATIN CALCIUM 5 MG T: 5 | 30 days supply | Qty: 30 | Fill #0

## 2016-06-27 NOTE — Patient Instructions (Addendum)
Dr. Debara Pickett has referred you to a Hematologist @ [facility]  Your physician has recommended you make the following change in your medication: START Crestor 5mg  once daily  Your physician recommends that you return for lab work in: [place] wants you to follow-up in: [place] with Dr. Debara Pickett

## 2016-06-27 NOTE — Progress Notes (Signed)
OFFICE NOTE  Chief Complaint:  Follow-up studies  Primary Care Physician: PROVIDER NOT IN SYSTEM  HPI:  [name] is a 51 y.o. male who presents at the request of his vascular surgeon for coronary evaluation. He recently was noted to have claudication of lower extremities and underwent ABIs which indicated a reduced ABI of 0.8 on the right and TBI of 0.47. 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 He subsequently underwent right common iliac artery stenting. Afterwards he noted marked improvement in his right leg pain. Subsequent to that he had an acute presentation of right leg pain and was again brought back for angiography on 05/11/2016. The previously placed common iliac stent was occluded and then retreated. No obvious etiology for this was found. It was thought that a cardio embolic episode could be responsible for this, however in my experience this is extremely rare for clot to travel from the ventricle and large and a patent stent in the common iliac artery. What would be more likely could be edge dissection or perhaps underexpansion of the stent or even noncompliance with antiplatelet medications. A clotting disorder has been postulated however seems unlikely given his age and the fact that he's had no prior clotting history, however it should be considered. In addition he's never had a cholesterol test and likely has some elevated cholesterol. From a coronary standpoint he reports after being questioned that he has recently had some chest discomfort, worsening shortness of breath and fatigue which he seemed to pass off to other issues. He is a smoker and has not yet been able to quit. He does seem to be interested in smoking cessation. He also reported that he recently traumatized his right knee and is wearing a brace. Findings indicate that he might need to have total knee  replacement in the near future.  06/27/2016  [name] returns today for follow-up of his studies. He underwent a nuclear stress test which was low risk and negative for ischemia however showed a reduced EF of 44%. A follow-up echo showed normal LV function and borderline diastolic dysfunction, suggesting that the EF on his nuclear stress test was abnormal due to likely gating abnormality. He did undergo additional blood work including workup for coagulation disorder. Ultimately, this did show an abnormal beta 2 glycoprotein IgG suggesting antiphospholipid antibodies. Lipid profile showed total cholesterol 149 contrast was 159, HDL 28 and LDL-C and 89. He is not on statin therapy although goal LDL cholesterol is less than 70.  PMHx:  No past medical history on file.  Past Surgical History:  Procedure Laterality Date  . KNEE SURGERY    . neck surger    . NECK SURGERY    . PERIPHERAL VASCULAR CATHETERIZATION N/A 02/24/2016   Procedure: Abdominal Aortogram w/Lower Extremity;  Surgeon: Conrad Williams, MD;  Location: [place] [facility];  Service: Cardiovascular;  Laterality: N/A;  . PERIPHERAL VASCULAR CATHETERIZATION Right 02/24/2016   Procedure: Peripheral Vascular Intervention;  Surgeon: Conrad [place], MD;  Location: [place] [facility];  Service: Cardiovascular;  Laterality: Right;  Common iliac  . PERIPHERAL VASCULAR CATHETERIZATION N/A 05/11/2016   Procedure: Abdominal Aortogram;  Surgeon: Conrad Fairdale, MD;  Location: [place] [facility];  Service: Cardiovascular;  Laterality: N/A;  . PERIPHERAL VASCULAR CATHETERIZATION N/A 05/11/2016   Procedure: Lower Extremity Angiography;  Surgeon: Conrad Redmond, MD;  Location: [place] [facility];  Service: Cardiovascular;  Laterality: N/A;    FAMHx:  Family History  Problem Relation Age of Onset  . Cancer Father     SOCHx:   reports that he has been smoking E-cigarettes.  He has never used smokeless tobacco. His alcohol and drug histories are not on  file.  ALLERGIES:  No Known Allergies  ROS: Pertinent items noted in HPI and remainder of comprehensive ROS otherwise negative.  HOME MEDS: Current Outpatient Prescriptions on File Prior to Visit  Medication Sig Dispense Refill  . clopidogrel (PLAVIX) 75 MG tablet Take 1 tablet (75 mg total) by mouth daily. 30 tablet 11  . esomeprazole (NEXIUM) 20 MG capsule Take 20 mg by mouth daily at 12 noon.    . gabapentin (NEURONTIN) 100 MG capsule Take 2 capsules (200 mg total) by mouth 3 (three) times daily. (Patient taking differently: Take 200 mg by mouth at bedtime. ) 180 capsule 3  . ibuprofen (ADVIL,MOTRIN) 200 MG tablet Take 400 mg by mouth every 6 (six) hours as needed for headache.    . ranitidine (ZANTAC) 150 MG tablet Take 150 mg by mouth daily as needed for heartburn.    . varenicline (CHANTIX CONTINUING MONTH PAK) 1 MG tablet Take 1 tablet (1 mg total) by mouth 2 (two) times daily. 60 tablet 1  . varenicline (CHANTIX STARTING MONTH PAK) 0.5 MG X 11 & 1 MG X 42 tablet Take as directed per package instructions. 53 tablet 0   No current facility-administered medications on file prior to visit.     LABS/IMAGING: No results found for this or any previous visit (from the past 48 hour(s)). No results found.  WEIGHTS: Wt Readings from Last 3 Encounters:  06/27/16 190 lb (86.2 kg)  06/23/16 191 lb (86.6 kg)  06/14/16 185 lb (83.9 kg)    VITALS: BP (!) 104/58   Pulse 76   Ht 5\' 10"  (1.778 m)   Wt 190 lb (86.2 kg)   BMI 27.26 kg/m   EXAM: Deferred  EKG: Deferred  ASSESSMENT: 1. Low risk for upcoming surgery 2. 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 Dyslipidemia 4. PAD with lifestyle limiting claudication status post right CIA stent 5. 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 Chest pain, progressive dyspnea and fatigue 7. Right knee injury 8. Tobacco abuse  PLAN: 1.   [name] may have antiphospholipid antibody syndrome. I would like to refer him to  hematology to be seen preferably this week or early next week in anticipation of upcoming surgery to make sure that he is adequately protected from thrombosis. He is currently on aspirin and Plavix. It is my understanding that should be sufficient however further testing may be necessary. I would advise starting low-dose Crestor 5 mg daily with goal LDL less than 70. Otherwise should be at acceptable risk for upcoming peripheral bypass by Dr. Bridgett Larsson.  I will repeat a lipid profile in 3 months and see him back at that time.  Pixie Casino, MD, [facility] Attending Cardiologist [place] 06/27/2016, 5:09 PM

## 2016-06-28 ENCOUNTER — Telehealth: Payer: Self-pay | Admitting: *Deleted

## 2016-06-28 NOTE — Telephone Encounter (Signed)
Spoke with [name]--informed her I had spoken with the [facility] Coord[facility]--his records will be reviewed today by the DOD and they will receive a call regarding the appointment.  I told her if they had not heard anything by late this afternoon, please let me know and I would call back.

## 2016-06-29 ENCOUNTER — Telehealth: Payer: Self-pay | Admitting: Hematology

## 2016-06-29 NOTE — Telephone Encounter (Signed)
Pt confirmed appt, verified demo, no letter mailed per pt request, in basket app date/time to [facility].

## 2016-07-03 ENCOUNTER — Encounter: Payer: Self-pay | Admitting: Hematology

## 2016-07-03 ENCOUNTER — Ambulatory Visit (HOSPITAL_BASED_OUTPATIENT_CLINIC_OR_DEPARTMENT_OTHER): Payer: 59 | Admitting: Hematology

## 2016-07-03 ENCOUNTER — Ambulatory Visit (HOSPITAL_BASED_OUTPATIENT_CLINIC_OR_DEPARTMENT_OTHER): Payer: 59

## 2016-07-03 ENCOUNTER — Telehealth: Payer: Self-pay | Admitting: Hematology

## 2016-07-03 VITALS — BP 114/63 | HR 68 | Temp 98.1°F | Resp 16 | Ht 70.0 in | Wt 186.4 lb

## 2016-07-03 DIAGNOSIS — R760 Raised antibody titer: Secondary | ICD-10-CM | POA: Diagnosis not present

## 2016-07-03 DIAGNOSIS — I745 Embolism and thrombosis of iliac artery: Secondary | ICD-10-CM | POA: Diagnosis not present

## 2016-07-03 LAB — COMPREHENSIVE METABOLIC PANEL
ALBUMIN: 4.1 g/dL (ref 3.5–5.0)
ALK PHOS: 85 U/L (ref 40–150)
ALT: 13 U/L (ref 0–55)
ANION GAP: 9 meq/L (ref 3–11)
AST: 17 U/L (ref 5–34)
BUN: 7.7 mg/dL (ref 7.0–26.0)
CALCIUM: 9.7 mg/dL (ref 8.4–10.4)
CO2: 25 mEq/L (ref 22–29)
Chloride: 104 mEq/L (ref 98–109)
Creatinine: 0.9 mg/dL (ref 0.7–1.3)
Glucose: 87 mg/dl (ref 70–140)
POTASSIUM: 4 meq/L (ref 3.5–5.1)
Sodium: 138 mEq/L (ref 136–145)
Total Bilirubin: 1.03 mg/dL (ref 0.20–1.20)
Total Protein: 7.8 g/dL (ref 6.4–8.3)

## 2016-07-03 LAB — CBC & DIFF AND RETIC
BASO%: 1.1 % (ref 0.0–2.0)
BASOS ABS: 0.1 10*3/uL (ref 0.0–0.1)
EOS ABS: 0.6 10*3/uL — AB (ref 0.0–0.5)
EOS%: 4.9 % (ref 0.0–7.0)
HEMATOCRIT: 26.5 % — AB (ref 38.4–49.9)
HEMOGLOBIN: 7.5 g/dL — AB (ref 13.0–17.1)
Immature Retic Fract: 20.3 % — ABNORMAL HIGH (ref 3.00–10.60)
LYMPH%: 18.1 % (ref 14.0–49.0)
MCH: 17.4 pg — ABNORMAL LOW (ref 27.2–33.4)
MCHC: 28.3 g/dL — ABNORMAL LOW (ref 32.0–36.0)
MCV: 61.4 fL — AB (ref 79.3–98.0)
MONO#: 0.6 10*3/uL (ref 0.1–0.9)
MONO%: 5.5 % (ref 0.0–14.0)
NEUT%: 70.4 % (ref 39.0–75.0)
NEUTROS ABS: 8.1 10*3/uL — AB (ref 1.5–6.5)
Platelets: 362 10*3/uL (ref 140–400)
RBC: 4.31 10*6/uL (ref 4.20–5.82)
RDW: 20.7 % — ABNORMAL HIGH (ref 11.0–14.6)
Retic %: 1.75 % (ref 0.80–1.80)
Retic Ct Abs: 75.43 10*3/uL (ref 34.80–93.90)
WBC: 11.6 10*3/uL — AB (ref 4.0–10.3)
lymph#: 2.1 10*3/uL (ref 0.9–3.3)

## 2016-07-03 MED ORDER — ENOXAPARIN SODIUM 80 MG/0.8ML ~~LOC~~ SOLN: 80.0000 mg | Freq: Two times a day (BID) | SUBCUTANEOUS | 0 refills | Status: DC

## 2016-07-03 MED FILL — ENOXAPARIN 80 MG/0.8 ML SYR: 80 | 30 days supply | Qty: 16 | Fill #0

## 2016-07-03 NOTE — Patient Instructions (Signed)
-  Would start taking Lovenox shots as prescribed 80mg  q12h subcutaneous due to concern for Antiphospholipid ab Syndrome as a possible additional etiology or arterial thrombosis/stent thrombosis. -In light of your upcoming surgery would recommend you take the last dose of Lovenox in the morning of the day prior to surgery. -Would recommend continuing baby aspirin through the time for surgery if okay per her surgeon. -Would hold the Plavix 5-7 days prior to surgery. -Would need to restart on prophylactic Lovenox as soon as possible after surgery and then transition to therapeutic dose of Lovenox when okay per her surgeon. -After surgery the patient will need to transition to therapeutic Coumadin with bridging Lovenox. -He will need to follow-up with his primary care physician or cardiology Coumadin clinic for ongoing Coumadin monitoring and adjustments. -We shall see him back in 3 months with repeat testing for his antiphospholipid antibodies to determine persistence of these antibodies and need for long-term ongoing anticoagulation. -Patient has a preoperative evaluation with his surgery clinic on Wednesday to weigh in regarding antiplatelet therapy as well.

## 2016-07-03 NOTE — Progress Notes (Signed)
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  Patient has a history of smoking 1-1/2 packs per day for about 40 years, dyslipidemia but no other known significant chronic medical comorbidities. He has not had a primary care physician and routine screenings.  He presented to the vascular surgeon in September/October 2017 with claudication of his lower extremities especially cramping discomfort in his buttocks and right thigh and was noted to have a reduced ABI of 0.8 on the right side. 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 He underwent right common iliac artery stenting with significant improvement in his right lower extremity claudication symptoms. He was apparently placed on Plavix at the time.  He presented again in late December 2017 with acute worsening of his right lower extremity pain and had repeat angiography on 05/11/2016 which showed the previously placed common iliac stent was occluded and this was retreated. No obvious etiology was noted.  Patient was started on aspirin in addition to Plavix. Concern for a clotting disorder versus incomplete stent expansion versus edge dissection were entertained. Patient had anticardiolipin antibody and beta-2 glycoprotein antibody testing. He was noted to have a  significantly elevated beta-2 glycoprotein IgG that was concerning for antiphospholipid antibody syndrome.  Patient was tentatively plan to have arterial bypass surgery on 07/11/2016 with Dr. Bridgett Larsson. He has been seen by Dr. Debara Pickett and had a nuclear cardiac stress test which showed a slightly reduced ejection fraction of 44% but negative for ischemia. Repeat echo showed normal ejection fraction.  Patient has had some mild anemia with hemoglobin of 10.5 when he first had his cardiac angiogram of 10.2 on the subsequent angiogram. He does not report any overt bleeding on his aspirin plus Plavix. Does note some mild hemorrhoidal bleeding on and off for the last 1 year.  MEDICAL HISTORY:  Past Medical History:  Diagnosis Date  . Anxiety   . Blood dyscrasia    clotting  disorder  . Dyspnea    w/ exertion    hx pleuralsy yrs ago  . GERD (gastroesophageal reflux disease)   . Hyperlipidemia     SURGICAL HISTORY: Past Surgical History:  Procedure Laterality Date  . KNEE SURGERY    . neck surger    . NECK SURGERY    . PERIPHERAL VASCULAR CATHETERIZATION N/A 02/24/2016   Procedure: Abdominal Aortogram w/Lower Extremity;  Surgeon: Conrad Stites, MD;  Location: [place] [facility];  Service: Cardiovascular;  Laterality: N/A;  . PERIPHERAL VASCULAR CATHETERIZATION Right 02/24/2016   Procedure: Peripheral Vascular Intervention;  Surgeon: Conrad Dwale, MD;  Location: [place] [facility];  Service: Cardiovascular;  Laterality: Right;  Common iliac  . PERIPHERAL VASCULAR CATHETERIZATION N/A 05/11/2016   Procedure: Abdominal Aortogram;  Surgeon: Conrad Phenix, MD;  Location: [place] [facility];  Service: Cardiovascular;  Laterality: N/A;  . PERIPHERAL VASCULAR CATHETERIZATION N/A 05/11/2016   Procedure: Lower Extremity Angiography;  Surgeon: Conrad [place]ville, MD;  Location: [place] [facility];  Service: Cardiovascular;  Laterality: N/A;    SOCIAL HISTORY: Social History   Social History  . Marital status:  Married    Spouse name: N/A  . Number of children: N/A  . Years of education: N/A   Occupational History  . Not on file.   Social History Main Topics  . Smoking status: Current Every Day Smoker    Types: E-cigarettes  . Smokeless tobacco: Never Used     Comment: 1/2 pk per day.  Vaporizor has 0% nicotine.   . Alcohol use Not on file  . Drug use: Unknown  . Sexual activity: Not on file   Other Topics Concern  . Not on file   Social History Narrative  . No narrative on file    FAMILY HISTORY: Family History  Problem Relation Age of Onset  . Cancer Father     ALLERGIES:  has No Known Allergies.  MEDICATIONS:  Current Outpatient Prescriptions  Medication Sig Dispense Refill  . aspirin EC 81 MG tablet Take 81 mg by mouth daily.    Marland Kitchen esomeprazole (NEXIUM) 20 MG capsule Take 20 mg by mouth daily.     Marland Kitchen gabapentin (NEURONTIN) 100 MG capsule Take 2 capsules (200 mg total) by mouth 3 (three) times daily. (Patient taking differently: Take 200 mg by mouth at bedtime. ) 180 capsule 3  . ibuprofen (ADVIL,MOTRIN) 200 MG tablet Take 400 mg by mouth every 6 (six) hours as needed (for pain/headache.).     Marland Kitchen ranitidine (ZANTAC) 150 MG tablet Take 150 mg by mouth daily as needed for heartburn.    . rosuvastatin (CRESTOR) 5 MG tablet Take 1 tablet (5 mg total) by mouth daily. 30 tablet 5  . varenicline (CHANTIX CONTINUING MONTH PAK) 1 MG tablet Take 1 tablet (1 mg total) by mouth 2 (two) times daily. 60 tablet 1  . varenicline (CHANTIX STARTING MONTH PAK) 0.5 MG X 11 & 1 MG X 42 tablet Take as directed per package instructions. (Patient taking differently: Take 1 mg by mouth daily. Take as directed per package instructions.) 53 tablet 0  . enoxaparin (LOVENOX) 80 MG/0.8ML injection Inject 0.8 mLs (80 mg total) into the skin every 12 (twelve) hours. 30 Syringe 0   No current facility-administered medications for this visit.     REVIEW OF SYSTEMS:    10 Point review of Systems was done is  negative except as noted above.  PHYSICAL EXAMINATION: ECOG PERFORMANCE STATUS: 1 - Symptomatic but completely ambulatory  . Vitals:   07/03/16 1215  BP: 114/63  Pulse: 68  Resp: 16  Temp: 98.1 F (36.7 C)   Filed Weights   07/03/16 1215  Weight: 186 lb 6.4 oz (84.6 kg)   .Body mass index is 26.75 kg/m.  GENERAL:alert, in no acute distress and comfortable SKIN: skin color, texture, turgor are normal, no rashes or significant lesions EYES: normal, conjunctiva are pink and non-injected, sclera clear OROPHARYNX:no exudate, no erythema and lips, buccal mucosa, and tongue normal  NECK: supple, no JVD, thyroid normal size, non-tender, without nodularity LYMPH:  no palpable lymphadenopathy in the cervical, axillary or inguinal LUNGS: clear to auscultation with normal respiratory effort HEART: regular rate & rhythm,  no murmurs and no lower extremity edema ABDOMEN: abdomen soft, non-tender, normoactive bowel sounds  Musculoskeletal: Right lower extremity degrees posterior tibial and DP pulses. PSYCH: alert & oriented x 3 with fluent speech NEURO: no focal motor/sensory deficits  LABORATORY DATA:  I have reviewed the data as listed  . CBC Latest Ref Rng & Units 05/11/2016 02/24/2016 07/21/2010  WBC 4.0 - 10.5 K/uL - - 15.4(H)  Hemoglobin 13.0 - 17.0 g/dL 10.2(L) 10.5(L) 14.3  Hematocrit 39.0 - 52.0 % 30.0(L) 31.0(L) 42.3  Platelets 150 - 400 K/uL - - 300    . CMP Latest Ref Rng & Units 06/20/2016 05/11/2016 02/24/2016  Glucose 65 - 99 mg/dL 101(H) 106(H) 102(H)  BUN 7 - 25 mg/dL 11 10 9   Creatinine 0.60 - 1.35 mg/dL 1.01 0.90 0.90  Sodium 135 - 146 mmol/L 139 139 140  Potassium 3.5 - 5.3 mmol/L 4.0 3.8 4.2  Chloride 98 - 110 mmol/L 103 105 103  CO2 20 - 31 mmol/L 25 - -  Calcium 8.6 - 10.3 mg/dL 9.7 - -  Total Protein 6.1 - 8.1 g/dL 7.7 - -  Total Bilirubin 0.2 - 1.2 mg/dL 0.9 - -  Alkaline Phos 40 - 115 U/L 82 - -  AST 10 - 40 U/L 20 - -  ALT 9 - 46 U/L 16 - -    Component     Latest Ref Rng & Units 06/20/2016         8:37 AM  Phosphatidylserine IgG Autoantibodies     <10 U/mL <10  Phosphatydalserine, IgM     <25 U/mL <25  Phosphatydalserine, IgA     <20 U/mL <20  Beta-2 Glycoprotein I Ab, IgG     <=20 SGU 150 (H)  Beta-2-Glycoprotein I IgM     <=20 SMU 10  Beta-2-Glycoprotein I IgA     <=20 SAU 11  Anticardiolipin Ab,IgA,Qn     APL <11  Anticardiolipin Ab,IgG,Qn     GPL <14  Anticardiolipin Ab,IgM,Qn     MPL <12  Result      REPORT  Interpretation      REPORT  Reviewer      REPORT  Interpretation        Reviewer        Act.Prt.C Resist.     >=2.1 ratio 4.3  Antithrombin Activity     80 - 120 % activity 118    RADIOGRAPHIC STUDIES: I have personally reviewed the radiological images as listed and agreed with the findings in the report. No results found.  ASSESSMENT & PLAN:   51 year old gentleman with history of dyslipidemia and chronic smoker with  1) Rt common Iliac artery thrombosis. This was initially noted to be related to an arterial dissection. Other risk factors at baseline include strong history of smoking.  Patient's right common iliac artery was stented and he had a repeat thrombosis while on Plavix noted on 05/11/2016. Aspirin was added at this time. Patient notes that he still having right buttock and thigh claudications and some coldness and paresthesias intermittently in his right foot.  Noted to have elevated beta-2 glycoprotein IgG with titers that were significantly elevated at 150  2) Elevated beta-2 glycoprotein IgG - cannot r/o antiphospholipid antibody syndrome as a result of this. To prove this is clinical significant will need to demonstrate persistence of ab in 12 weeks Given his presentation - there is enough concern that we would need to treat this as APLA syndrome.  3) Anemia - mild ?blood loss from hemorrhoids/angiography x 2 . PLAN - we will recheck antiphospholipid antibody panel  including lupus anticoagulant. -recheck baseline labs - if cbc , renal and kidney function stable we would recommend  Would start taking Lovenox shots as  prescribed 80mg  q12h subcutaneous due to concern for Antiphospholipid ab Syndrome as a possible additional etiology or arterial thrombosis/stent thrombosis. -In light of your upcoming surgery would recommend you take the last dose of Lovenox in the morning of the day prior to surgery. -Would recommend continuing baby aspirin through the time for surgery if okay per your surgeon. -Would hold the Plavix 5-7 days prior to surgery. -Would need to restart on prophylactic Lovenox as soon as possible after surgery and then transition to therapeutic dose of Lovenox when okay per her surgeon. -After surgery the patient will need to transition to therapeutic Coumadin with bridging Lovenox. -He will need to follow-up with his primary care physician or cardiology Coumadin clinic for ongoing Coumadin monitoring and adjustments. -We shall see him back in 3 months with repeat testing for his antiphospholipid antibodies to determine persistence of these antibodies and need for long-term ongoing anticoagulation. -Patient has a preoperative evaluation with his surgery clinic on Wednesday to weigh in regarding antiplatelet therapy as well.  Labs today RTC with Dr Irene Limbo in 3 months with rpt labs  All of the patients questions were answered with apparent satisfaction. The patient knows to call the clinic with any problems, questions or concerns.  I spent 60 minutes counseling the patient face to face. The total time spent in the appointment was 80 minutes and more than 50% was on counseling and direct patient cares.    Sullivan Lone MD [place] AAHIVMS [facility] Hematology/Oncology Physician [facility]  (Office):       [phone] (Work cell):  [phone] (Fax):           [phone]  07/03/2016 12:29 PM

## 2016-07-03 NOTE — Telephone Encounter (Signed)
Appointments scheduled per 2/12 LOS. Patient given AVS report and calendars with future scheduled appointments. °

## 2016-07-04 ENCOUNTER — Telehealth: Payer: Self-pay | Admitting: *Deleted

## 2016-07-04 ENCOUNTER — Other Ambulatory Visit: Payer: Self-pay

## 2016-07-04 LAB — LUPUS ANTICOAGULANT PANEL
PTT-LA: 36.5 s (ref 0.0–51.9)
dRVVT: 41 s (ref 0.0–47.0)

## 2016-07-04 LAB — HOMOCYSTEINE: Homocysteine: 9.7 umol/L (ref 0.0–15.0)

## 2016-07-04 NOTE — Telephone Encounter (Signed)
SW Colletta Maryland at surgical office regarding pt's upcoming surgery next week.  Informed Colletta Maryland that pt is cleared for surgery from hematological standpoint per Dr. Irene Limbo.  Pt instructed to hold plavix until given further instructions, and given instructions on Lovenox usage.  Pt given Rx for 12 hour lovenox injections.  Pt instructed to take one dose in the AM, and to hold PM dose the day prior to surgery.  Pt and wife verbalized understanding.

## 2016-07-04 NOTE — Pre-Procedure Instructions (Signed)
[name]  07/04/2016      [id number] - [place], [place] - [address] [place] [address] Phone: [phone] Fax: [phone]  [place], [place] - [address] [place] [address] Phone: [phone] Fax: [phone]    Your procedure is scheduled on Tuesday February 20.  Report to [facility] Admitting at 5:30 A.M.  Call this number if you have problems the morning of surgery:  [phone]   Remember:  Do not eat food or drink liquids after midnight.  Take these medicines the morning of surgery with A SIP OF WATER: esomeprazole (Nexium), gabapentin (neurontin), ranitidine (Zantac)  7 days prior to surgery STOP taking any Aleve, Naproxen, Ibuprofen, Motrin, Advil, Goody's, BC's, all herbal medications, fish oil, and all vitamins    Do not wear jewelry  Do not wear lotions, powders, or colognes, or deoderant.  Men may shave face and neck.  Do not bring valuables to the hospital.  [facility] is not responsible for any belongings or valuables.  Contacts, dentures or bridgework may not be worn into surgery.  Leave your suitcase in the car.  After surgery it may be brought to your room.  For patients admitted to the hospital, discharge time will be determined by your treatment team.  Patients discharged the day of surgery will not be allowed to drive home.    Special instructions:    [place]- Preparing For Surgery  Before surgery, you can play an important role. Because skin is not sterile, your skin needs to be as free of germs as possible. You can reduce the number of germs on your skin by washing with CHG (chlorahexidine gluconate) Soap before surgery.  CHG is an antiseptic cleaner which kills germs and bonds with the skin to continue killing germs even after washing.  Please do not use if you have an allergy to CHG or antibacterial  soaps. If your skin becomes reddened/irritated stop using the CHG.  Do not shave (including legs and underarms) for at least 48 hours prior to first CHG shower. It is OK to shave your face.  Please follow these instructions carefully.   1. Shower the NIGHT BEFORE SURGERY and the MORNING OF SURGERY with CHG.   2. If you chose to wash your hair, wash your hair first as usual with your normal shampoo.  3. After you shampoo, rinse your hair and body thoroughly to remove the shampoo.  4. Use CHG as you would any other liquid soap. You can apply CHG directly to the skin and wash gently with a scrungie or a clean washcloth.   5. Apply the CHG Soap to your body ONLY FROM THE NECK DOWN.  Do not use on open wounds or open sores. Avoid contact with your eyes, ears, mouth and genitals (private parts). Wash genitals (private parts) with your normal soap.  6. Wash thoroughly, paying special attention to the area where your surgery will be performed.  7. Thoroughly rinse your body with warm water from the neck down.  8. DO NOT shower/wash with your normal soap after using and rinsing off the CHG Soap.  9. Pat yourself dry with a CLEAN TOWEL.   10. Wear CLEAN PAJAMAS   11. Place CLEAN SHEETS on your bed the night of your first shower and DO NOT SLEEP WITH PETS.    Day of Surgery: Do  not apply any deodorants/lotions. Please wear clean clothes to the hospital/surgery center.      Please read over the following fact sheets that you were given. MRSA Information

## 2016-07-05 ENCOUNTER — Encounter (HOSPITAL_COMMUNITY)
Admission: RE | Admit: 2016-07-05 | Discharge: 2016-07-05 | Disposition: A | Discharge status: Home / Self Care | Payer: 59 | Source: Ambulatory Visit | Attending: Vascular Surgery | Admitting: Vascular Surgery

## 2016-07-05 ENCOUNTER — Encounter (HOSPITAL_COMMUNITY): Payer: Self-pay

## 2016-07-05 ENCOUNTER — Ambulatory Visit (INDEPENDENT_AMBULATORY_CARE_PROVIDER_SITE_OTHER): Payer: 59 | Admitting: Family Medicine

## 2016-07-05 ENCOUNTER — Encounter: Payer: Self-pay | Admitting: Family Medicine

## 2016-07-05 VITALS — BP 132/78 | HR 81 | Temp 98.1°F | Ht 70.0 in | Wt 189.6 lb

## 2016-07-05 DIAGNOSIS — F17200 Nicotine dependence, unspecified, uncomplicated: Secondary | ICD-10-CM | POA: Diagnosis not present

## 2016-07-05 DIAGNOSIS — G8929 Other chronic pain: Secondary | ICD-10-CM

## 2016-07-05 DIAGNOSIS — R918 Other nonspecific abnormal finding of lung field: Secondary | ICD-10-CM

## 2016-07-05 DIAGNOSIS — M25512 Pain in left shoulder: Secondary | ICD-10-CM | POA: Diagnosis not present

## 2016-07-05 DIAGNOSIS — K648 Other hemorrhoids: Secondary | ICD-10-CM | POA: Diagnosis not present

## 2016-07-05 DIAGNOSIS — D123 Benign neoplasm of transverse colon: Secondary | ICD-10-CM | POA: Diagnosis not present

## 2016-07-05 DIAGNOSIS — F419 Anxiety disorder, unspecified: Secondary | ICD-10-CM | POA: Diagnosis not present

## 2016-07-05 DIAGNOSIS — F17210 Nicotine dependence, cigarettes, uncomplicated: Secondary | ICD-10-CM | POA: Diagnosis not present

## 2016-07-05 DIAGNOSIS — K219 Gastro-esophageal reflux disease without esophagitis: Secondary | ICD-10-CM

## 2016-07-05 DIAGNOSIS — D509 Iron deficiency anemia, unspecified: Secondary | ICD-10-CM | POA: Diagnosis not present

## 2016-07-05 DIAGNOSIS — Z23 Encounter for immunization: Secondary | ICD-10-CM | POA: Diagnosis not present

## 2016-07-05 DIAGNOSIS — E663 Overweight: Secondary | ICD-10-CM | POA: Diagnosis not present

## 2016-07-05 DIAGNOSIS — I739 Peripheral vascular disease, unspecified: Secondary | ICD-10-CM | POA: Diagnosis not present

## 2016-07-05 DIAGNOSIS — K644 Residual hemorrhoidal skin tags: Secondary | ICD-10-CM | POA: Diagnosis not present

## 2016-07-05 DIAGNOSIS — I70211 Atherosclerosis of native arteries of extremities with intermittent claudication, right leg: Secondary | ICD-10-CM

## 2016-07-05 DIAGNOSIS — D6861 Antiphospholipid syndrome: Secondary | ICD-10-CM | POA: Diagnosis not present

## 2016-07-05 DIAGNOSIS — D72829 Elevated white blood cell count, unspecified: Secondary | ICD-10-CM | POA: Diagnosis not present

## 2016-07-05 DIAGNOSIS — Z7982 Long term (current) use of aspirin: Secondary | ICD-10-CM | POA: Diagnosis not present

## 2016-07-05 DIAGNOSIS — E785 Hyperlipidemia, unspecified: Secondary | ICD-10-CM | POA: Diagnosis not present

## 2016-07-05 DIAGNOSIS — K449 Diaphragmatic hernia without obstruction or gangrene: Secondary | ICD-10-CM | POA: Diagnosis not present

## 2016-07-05 DIAGNOSIS — F1021 Alcohol dependence, in remission: Secondary | ICD-10-CM | POA: Diagnosis not present

## 2016-07-05 DIAGNOSIS — Z7902 Long term (current) use of antithrombotics/antiplatelets: Secondary | ICD-10-CM | POA: Diagnosis not present

## 2016-07-05 HISTORY — DX: Gastro-esophageal reflux disease without esophagitis: K21.9

## 2016-07-05 LAB — URINALYSIS, ROUTINE W REFLEX MICROSCOPIC
Bilirubin Urine: NEGATIVE
GLUCOSE, UA: NEGATIVE mg/dL
HGB URINE DIPSTICK: NEGATIVE
Ketones, ur: NEGATIVE mg/dL
Leukocytes, UA: NEGATIVE
Nitrite: NEGATIVE
PROTEIN: NEGATIVE mg/dL
SPECIFIC GRAVITY, URINE: 1.017 (ref 1.005–1.030)
pH: 5 (ref 5.0–8.0)

## 2016-07-05 LAB — SURGICAL PCR SCREEN
MRSA, PCR: NEGATIVE
Staphylococcus aureus: POSITIVE — AB

## 2016-07-05 LAB — COMPREHENSIVE METABOLIC PANEL
ALK PHOS: 69 U/L (ref 38–126)
ALT: 12 U/L — ABNORMAL LOW (ref 17–63)
ANION GAP: 8 (ref 5–15)
AST: 19 U/L (ref 15–41)
Albumin: 3.9 g/dL (ref 3.5–5.0)
BUN: 5 mg/dL — ABNORMAL LOW (ref 6–20)
CO2: 24 mmol/L (ref 22–32)
Calcium: 9 mg/dL (ref 8.9–10.3)
Chloride: 107 mmol/L (ref 101–111)
Creatinine, Ser: 0.9 mg/dL (ref 0.61–1.24)
GFR calc non Af Amer: >60 mL/min (ref >60)
Glucose, Bld: 91 mg/dL (ref 65–99)
POTASSIUM: 3.9 mmol/L (ref 3.5–5.1)
SODIUM: 139 mmol/L (ref 135–145)
Total Bilirubin: 0.7 mg/dL (ref 0.3–1.2)
Total Protein: 6.8 g/dL (ref 6.5–8.1)

## 2016-07-05 LAB — CBC
HCT: 28.8 % — ABNORMAL LOW (ref 39.0–52.0)
Hemoglobin: 7.6 g/dL — ABNORMAL LOW (ref 13.0–17.0)
MCH: 17.6 pg — AB (ref 26.0–34.0)
MCHC: 26.4 g/dL — AB (ref 30.0–36.0)
MCV: 66.5 fL — ABNORMAL LOW (ref 78.0–100.0)
PLATELETS: 308 10*3/uL (ref 150–400)
RBC: 4.33 MIL/uL (ref 4.22–5.81)
RDW: 20.9 % — AB (ref 11.5–15.5)
WBC: 11.1 10*3/uL — ABNORMAL HIGH (ref 4.0–10.5)

## 2016-07-05 LAB — PROTIME-INR
INR: 1.09
Prothrombin Time: 14.2 seconds (ref 11.4–15.2)

## 2016-07-05 LAB — BETA-2-GLYCOPROTEIN I ABS, IGG/M/A
Beta-2 Glyco 1 IgA: <9 GPI IgA units (ref 0–25)
Beta-2 Glycoprotein I Ab, IgG: >150 GPI IgG units — ABNORMAL HIGH (ref 0–20)

## 2016-07-05 LAB — ABO/RH: ABO/RH(D): A POS

## 2016-07-05 LAB — CARDIOLIPIN ANTIBODIES, IGG, IGM, IGA: ANTICARDIOLIPIN IGM: 11 [MPL'U]/mL (ref 0–12)

## 2016-07-05 LAB — APTT: aPTT: 25 seconds (ref 24–36)

## 2016-07-05 MED FILL — MUPIROCIN 2% OINTMENT: 2 | 10 days supply | Qty: 22 | Fill #0

## 2016-07-05 NOTE — Progress Notes (Signed)
PATIENT STATED HE WAS INSTRUCTED BY HEMATOLOGIST TO STOP PLAVIX, CONTINUE ASPIRIN AND LOVENOX INJECTIONS TIL SURGERY DATE.  LAST DAY OF PLAVIX PER PATIENT WAS 07/03/16.

## 2016-07-05 NOTE — Progress Notes (Signed)
Pre visit review using our clinic review tool, if applicable. No additional management support is needed unless otherwise documented below in the visit note. 

## 2016-07-06 ENCOUNTER — Other Ambulatory Visit: Payer: Self-pay

## 2016-07-06 ENCOUNTER — Encounter (HOSPITAL_COMMUNITY): Payer: Self-pay | Admitting: Emergency Medicine

## 2016-07-06 ENCOUNTER — Telehealth: Payer: Self-pay

## 2016-07-06 ENCOUNTER — Observation Stay (HOSPITAL_COMMUNITY)
Admission: EM | Admit: 2016-07-06 | Discharge: 2016-07-08 | Disposition: A | Discharge status: Home / Self Care | Payer: 59 | Attending: Internal Medicine | Admitting: Internal Medicine

## 2016-07-06 ENCOUNTER — Other Ambulatory Visit: Payer: Self-pay | Admitting: Hematology

## 2016-07-06 ENCOUNTER — Encounter (HOSPITAL_COMMUNITY): Payer: Self-pay | Admitting: Vascular Surgery

## 2016-07-06 DIAGNOSIS — F17210 Nicotine dependence, cigarettes, uncomplicated: Secondary | ICD-10-CM | POA: Insufficient documentation

## 2016-07-06 DIAGNOSIS — D123 Benign neoplasm of transverse colon: Secondary | ICD-10-CM | POA: Diagnosis not present

## 2016-07-06 DIAGNOSIS — D6861 Antiphospholipid syndrome: Secondary | ICD-10-CM | POA: Diagnosis not present

## 2016-07-06 DIAGNOSIS — F419 Anxiety disorder, unspecified: Secondary | ICD-10-CM | POA: Insufficient documentation

## 2016-07-06 DIAGNOSIS — K219 Gastro-esophageal reflux disease without esophagitis: Secondary | ICD-10-CM

## 2016-07-06 DIAGNOSIS — Z7982 Long term (current) use of aspirin: Secondary | ICD-10-CM | POA: Insufficient documentation

## 2016-07-06 DIAGNOSIS — K648 Other hemorrhoids: Secondary | ICD-10-CM | POA: Insufficient documentation

## 2016-07-06 DIAGNOSIS — D62 Acute posthemorrhagic anemia: Secondary | ICD-10-CM | POA: Diagnosis present

## 2016-07-06 DIAGNOSIS — K644 Residual hemorrhoidal skin tags: Secondary | ICD-10-CM | POA: Diagnosis not present

## 2016-07-06 DIAGNOSIS — Z7902 Long term (current) use of antithrombotics/antiplatelets: Secondary | ICD-10-CM | POA: Insufficient documentation

## 2016-07-06 DIAGNOSIS — E785 Hyperlipidemia, unspecified: Secondary | ICD-10-CM | POA: Insufficient documentation

## 2016-07-06 DIAGNOSIS — K449 Diaphragmatic hernia without obstruction or gangrene: Secondary | ICD-10-CM | POA: Insufficient documentation

## 2016-07-06 DIAGNOSIS — R760 Raised antibody titer: Secondary | ICD-10-CM | POA: Diagnosis present

## 2016-07-06 DIAGNOSIS — D649 Anemia, unspecified: Secondary | ICD-10-CM | POA: Diagnosis present

## 2016-07-06 DIAGNOSIS — D509 Iron deficiency anemia, unspecified: Secondary | ICD-10-CM | POA: Diagnosis not present

## 2016-07-06 DIAGNOSIS — I779 Disorder of arteries and arterioles, unspecified: Secondary | ICD-10-CM | POA: Diagnosis present

## 2016-07-06 DIAGNOSIS — F1021 Alcohol dependence, in remission: Secondary | ICD-10-CM | POA: Insufficient documentation

## 2016-07-06 DIAGNOSIS — D500 Iron deficiency anemia secondary to blood loss (chronic): Secondary | ICD-10-CM

## 2016-07-06 DIAGNOSIS — I739 Peripheral vascular disease, unspecified: Secondary | ICD-10-CM | POA: Insufficient documentation

## 2016-07-06 DIAGNOSIS — D72829 Elevated white blood cell count, unspecified: Secondary | ICD-10-CM | POA: Insufficient documentation

## 2016-07-06 LAB — CBC
HEMATOCRIT: 24.3 % — AB (ref 39.0–52.0)
Hemoglobin: 6.5 g/dL — CL (ref 13.0–17.0)
MCH: 17.6 pg — ABNORMAL LOW (ref 26.0–34.0)
MCHC: 26.7 g/dL — AB (ref 30.0–36.0)
MCV: 65.7 fL — ABNORMAL LOW (ref 78.0–100.0)
PLATELETS: 303 10*3/uL (ref 150–400)
RBC: 3.7 MIL/uL — ABNORMAL LOW (ref 4.22–5.81)
RDW: 20.5 % — AB (ref 11.5–15.5)
WBC: 10.6 10*3/uL — ABNORMAL HIGH (ref 4.0–10.5)

## 2016-07-06 LAB — COMPREHENSIVE METABOLIC PANEL
ALBUMIN: 3.9 g/dL (ref 3.5–5.0)
ALK PHOS: 60 U/L (ref 38–126)
ALT: 14 U/L — AB (ref 17–63)
AST: 21 U/L (ref 15–41)
Anion gap: 7 (ref 5–15)
BUN: 7 mg/dL (ref 6–20)
CO2: 26 mmol/L (ref 22–32)
CREATININE: 0.94 mg/dL (ref 0.61–1.24)
Calcium: 9 mg/dL (ref 8.9–10.3)
Chloride: 106 mmol/L (ref 101–111)
GFR calc Af Amer: >60 mL/min (ref >60)
GLUCOSE: 88 mg/dL (ref 65–99)
Potassium: 3.9 mmol/L (ref 3.5–5.1)
Sodium: 139 mmol/L (ref 135–145)
Total Bilirubin: 0.8 mg/dL (ref 0.3–1.2)
Total Protein: 7 g/dL (ref 6.5–8.1)

## 2016-07-06 LAB — PROTIME-INR
INR: 1.14
Prothrombin Time: 14.7 seconds (ref 11.4–15.2)

## 2016-07-06 MED ORDER — SODIUM CHLORIDE 0.9 % IV SOLN
10.0000 mL/h | Freq: Once | INTRAVENOUS | Status: AC
  Administered 2016-07-06: 10 mL/h via INTRAVENOUS

## 2016-07-06 NOTE — Progress Notes (Signed)
Anesthesia chart review: Patient is a 51 year old male scheduled for left to right femoral-femoral bypass on 07/11/2016 by Dr. Bridgett Larsson.  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  - PCP is Dr. Briscoe Deutscher, newly established 07/05/16. Note not yet available in Epic to view. - Cardiologist is Dr. Lyman Bishop, first established 06/02/16. Last visit 06/27/16.  - Hematologist is Dr. Sullivan Lone, newly established 07/03/16. Note not yet completed for review in Epic. He recommended: -Would start taking Lovenox shots as prescribed 80mg  q12h subcutaneous due to concern for Antiphospholipid ab Syndrome as a possible additional etiology or arterial thrombosis/stent thrombosis. -In light of your upcoming surgery would recommend you take the last dose of Lovenox in the morning of the day prior to surgery. -Would recommend continuing baby aspirin through the time for surgery if okay per her surgeon. -Would hold the Plavix 5-7 days prior to surgery. -Would need to restart on prophylactic Lovenox as soon as possible after surgery and then transition to therapeutic dose of Lovenox when okay per her surgeon. -After surgery the patient will need to transition to therapeutic Coumadin with bridging Lovenox. -He will need to follow-up with his primary care physician or cardiology Coumadin clinic for ongoing Coumadin monitoring and adjustments. -We shall see him back in 3 months with repeat testing for his antiphospholipid antibodies to determine persistence of these antibodies and need for long-term ongoing anticoagulation. -Patient has a preoperative evaluation with his surgery clinic on Wednesday to weigh in regarding antiplatelet therapy as well.  Meds include aspirin 81 mg, Lovenox, Nexium, Neurontin, Zantac, Crestor (not yet started), Chantix.  BP 127/68    Pulse 79   Temp 36.8 C   Resp 18   Ht 5\' 9"  (1.753 m)   Wt 187 lb 12.8 oz (85.2 kg)   SpO2 100%   BMI 27.73 kg/m   EKG 06/02/16: NSR.  Echo 06/20/16: Study Conclusions - Left ventricle: The cavity size was normal. Systolic function was   normal. The estimated ejection fraction was in the range of 55%   to 60%. Wall motion was normal; there were no regional wall   motion abnormalities. The study is not technically sufficient to   allow evaluation of LV diastolic function. Based on mitral inflow   patterns there appears to be grade 1 diastolic dysfunction.   However, based on tissue Doppler, diastolic function is normal. - Aortic valve: Transvalvular velocity was within the normal range.   There was no stenosis. There was no regurgitation. - Mitral valve: Transvalvular velocity was within the normal range.   There was no evidence for stenosis. There was no regurgitation. - Right ventricle: The cavity size was normal. Wall thickness was   normal. Systolic function was normal. - Pulmonic valve: Transvalvular velocity was within the normal   range. There was no evidence for stenosis. - Pulmonary arteries: Systolic pressure was within the normal   range. PA peak pressure: 25 mm Hg (S).  Nuclear stress test 06/14/16:  The left ventricular ejection fraction is moderately decreased (30-44%).  Nuclear stress EF: 44%.  No T wave inversion was noted during stress.  There was no ST segment deviation noted during stress.  This is an intermediate risk study.  No reversible ischemia. LVEF calculated at 44% but visually looks better- consider limited echo to re-assess. This is an intermediate risk study due to decreased LV function.  Preoperative labs noted.  Cr 0.90. WBC 11.1. H/H 7.6/28.8. Since latest PCP and hematology notes are not yet signed, I'm unsure if his anemia is being addressed. VVS RN Arbie Cookey to follow-up with Dr. Pollie Meyer' office and notify surgeon of H/H results. Will await  additional hematology input.  George Hugh [facility] Short Stay Center/Anesthesiology Phone [phone] 07/06/2016 4:35 PM

## 2016-07-06 NOTE — ED Provider Notes (Signed)
[facility] DEPT Provider Note   CSN: [id number] Arrival date & time: 07/06/16  1902     History   Chief Complaint Chief Complaint  Patient presents with  . Anemia    HPI [name] is a 51 y.o. male.  HPI    Pt is a 51 yo with clot in LL leg, schedule for bypass For next Tuesday. Patient seen hematologist. Noted anemia. Dropped from 10.4 and October to  7.9 yesterday. Patient sent in for hematologist to hold Plavix hold Lovenox be evaluated for source of bleed and then potentially restarted on anticoasgluation.  Patient's been evaluated for antiphospholipid syndrome    Past Medical History:  Diagnosis Date  . Anxiety   . Blood dyscrasia    clotting  disorder  . Dyspnea    w/ exertion    hx pleuralsy yrs ago  . GERD (gastroesophageal reflux disease)   . Hyperlipidemia     Patient Active Problem List   Diagnosis Date Noted  . Dyslipidemia 06/27/2016  . Preoperative cardiovascular examination 06/27/2016  . Antiphospholipid antibody positive 06/27/2016  . Shortness of breath 06/02/2016  . Chest pain 06/02/2016  . Thrombus 06/02/2016  . PAD (peripheral artery disease) ([place]) 06/02/2016  . Claudication ([place]) 06/02/2016  . Effusion of right knee 05/19/2016  . 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 Atherosclerosis of native arteries of extremity with intermittent claudication ([place]) 02/24/2016  . Weakness of right leg 01/11/2016  . Piriformis syndrome of right side 12/29/2015  . Leg length discrepancy 12/29/2015  . Reactive depression 06/24/2014  . Smoker 06/24/2014    Past Surgical History:  Procedure Laterality Date  . KNEE SURGERY    . neck surger    . NECK SURGERY    . PERIPHERAL VASCULAR CATHETERIZATION N/A 02/24/2016   Procedure: Abdominal Aortogram w/Lower Extremity;  Surgeon: Conrad Green[place] West, MD;  Location: [facility];  Service: Cardiovascular;  Laterality: N/A;  . PERIPHERAL VASCULAR CATHETERIZATION Right 02/24/2016   Procedure:  Peripheral Vascular Intervention;  Surgeon: Conrad [place], MD;  Location: [facility];  Service: Cardiovascular;  Laterality: Right;  Common iliac  . PERIPHERAL VASCULAR CATHETERIZATION N/A 05/11/2016   Procedure: Abdominal Aortogram;  Surgeon: Conrad Balfour, MD;  Location: [facility];  Service: Cardiovascular;  Laterality: N/A;  . PERIPHERAL VASCULAR CATHETERIZATION N/A 05/11/2016   Procedure: Lower Extremity Angiography;  Surgeon: Conrad Tustin, MD;  Location: [facility];  Service: Cardiovascular;  Laterality: N/A;       Home Medications    Prior to Admission medications   Medication Sig Start Date End Date Taking? Authorizing Provider  enoxaparin (LOVENOX) 80 MG/0.8ML injection Inject 0.8 mLs (80 mg total) into the skin every 12 (twelve) hours. 07/03/16  Yes Brunetta Genera, MD  esomeprazole (NEXIUM) 20 MG capsule Take 20 mg by mouth daily.    Yes Historical Provider, MD  gabapentin (NEURONTIN) 100 MG capsule Take 2 capsules (200 mg total) by mouth 3 (three) times daily. Patient taking differently: Take 200 mg by mouth at bedtime.  01/11/16  Yes Lyndal Pulley, DO  ranitidine (ZANTAC) 150 MG tablet Take 150 mg by mouth daily as needed for heartburn.   Yes Historical Provider, MD  rosuvastatin (CRESTOR) 5 MG tablet Take 1 tablet (5 mg total) by mouth daily. 06/27/16  Yes Pixie Casino, MD  varenicline (CHANTIX STARTING MONTH PAK) 0.5 MG X 11 & 1 MG X 42 tablet Take as directed per package instructions. Patient  taking differently: Take 1 mg by mouth daily. Take as directed per package instructions. 06/02/16  Yes Pixie Casino, MD  mupirocin ointment (BACTROBAN) 2 % Apply 1 application topically daily.  07/05/16   Historical Provider, MD  varenicline (CHANTIX CONTINUING MONTH PAK) 1 MG tablet Take 1 tablet (1 mg total) by mouth 2 (two) times daily. Patient not taking: Reported on 07/06/2016 06/02/16   Pixie Casino, MD    Family History Family History  Problem  Relation Age of Onset  . Cancer Father     Lung    Social History Social History  Substance Use Topics  . Smoking status: Current Every Day Smoker    Packs/day: 0.50    Types: Cigarettes, E-cigarettes  . Smokeless tobacco: Never Used     Comment: 1/2 pk per day.  Vaporizor has 0% nicotine.  Trying to quit  . Alcohol use No     Comment: hx  etoh     Allergies   Patient has no known allergies.   Review of Systems Review of Systems  Constitutional: Negative for activity change.  Respiratory: Positive for shortness of breath.   Cardiovascular: Negative for chest pain.  Gastrointestinal: Negative for abdominal pain.  Genitourinary: Negative for dysuria.  Skin: Positive for pallor.  All other systems reviewed and are negative.    Physical Exam Updated Vital Signs BP 121/80 (BP Location: Right Arm)   Pulse 76   Temp 98 F (36.7 C) (Oral)   Resp 18   Ht 5' 9.5" (1.765 m)   Wt 192 lb 14.4 oz (87.5 kg)   SpO2 94%   BMI 28.08 kg/m   Physical Exam  Constitutional: He is oriented to person, place, and time. He appears well-nourished.  Mild pallor  HENT:  Head: Normocephalic.  Eyes: Conjunctivae are normal.  Cardiovascular: Normal rate and regular rhythm.   Pulmonary/Chest: Effort normal and breath sounds normal. He has no wheezes.  Abdominal: Soft. He exhibits no distension. There is no tenderness.  Genitourinary: Rectum normal.  Genitourinary Comments: No stool in vault.  Neurological: He is oriented to person, place, and time.  Skin: Skin is warm and dry. He is not diaphoretic.  Psychiatric: He has a normal mood and affect. His behavior is normal.     ED Treatments / Results  Labs (all labs ordered are listed, but only abnormal results are displayed) Labs Reviewed  COMPREHENSIVE METABOLIC PANEL - Abnormal; Notable for the following:       Result Value   ALT 14 (*)    All other components within normal limits  CBC - Abnormal; Notable for the following:     WBC 10.6 (*)    RBC 3.70 (*)    Hemoglobin 6.5 (*)    HCT 24.3 (*)    MCV 65.7 (*)    MCH 17.6 (*)    MCHC 26.7 (*)    RDW 20.5 (*)    All other components within normal limits  PROTIME-INR  TYPE AND SCREEN  ABO/RH  PREPARE RBC (CROSSMATCH)    EKG  EKG Interpretation None       Radiology No results found.  Procedures Procedures (including critical care time)  Medications Ordered in ED Medications  0.9 %  sodium chloride infusion (not administered)     Initial Impression / Assessment and Plan / ED Course  I have reviewed the triage vital signs and the nursing notes.  Pertinent labs & imaging results that were available during my care of the  patient were reviewed by me and considered in my medical decision making (see chart for details).     Patient is a 51 year old male with thrombus and rethrombosis of the right iliac. Plan to have a femoral bypass on Tuesday. Noted to have anemia. Patient's hematologist sent him here to the emergency department secondary to anemia and need for transfusion. Patient's hemoglobin dropped from 1o in October to 7.6 yesterday to 6.5 today. No dark stools. Patient's hematologist would like him transfused up till hemoglobin of 9 as well as a workup for GI bleed. Please see his note. No stool in vault. Will admit.    CRITICAL CARE Performed by: Gardiner Sleeper Total critical care time: 45 minutes Critical care time was exclusive of separately billable procedures and treating other patients. Critical care was necessary to treat or prevent imminent or life-threatening deterioration. Critical care was time spent personally by me on the following activities: development of treatment plan with patient and/or surrogate as well as nursing, discussions with consultants, evaluation of patient's response to treatment, examination of patient, obtaining history from patient or surrogate, ordering and performing treatments and interventions, ordering  and review of laboratory studies, ordering and review of radiographic studies, pulse oximetry and re-evaluation of patient's condition.  Final Clinical Impressions(s) / ED Diagnoses   Final diagnoses:  None    New Prescriptions New Prescriptions   No medications on file     Kellyn Mansfield Julio Alm, MD 07/07/16 0111

## 2016-07-06 NOTE — Patient Outreach (Signed)
[facility]) Care Management  07/06/2016  Sa Pisarski 07/14/1965 [id number]  Subjective: Telephone call to patient. Discussed UMR pre-op follow up. Patient voices understanding and reports, "it may be postponed, my hematologist just called and shows that I am anemic and wants me to go now to check myself into the emergency room to get blood".     Objective: per chart review- Patient scheduled to be admitted on 07/11/16 with atherosclerosis of native arteries of extremities with rest pain-Plan for Bypass graft left to right femoral-femoral artery.   Assessment: received UMR pre-op call referral on 07/04/16. Pre op call  attempted, However client reports he received a call from his Hemaologist advising him to go to the emergency room for blood transfusion. Mr. Konrad reports he is in the process of getting ready. 51 year old with history of PAD, dyslipidemia, right iliac thrombosis.   Plan: RNCM will not delay his arrival to the emergency room, therefore RNCM will follow up tomorrow. RNCM also encouraged Mr. Pronovost to call back. RNCM's contact number provided.  Thea Silversmith, RN, MSN, [place] Coordinator Cell: [phone]

## 2016-07-06 NOTE — Telephone Encounter (Signed)
Hematology/Oncology Short Note  Received a call by vascular surgery RN and reviewed patients labs  Component     Latest Ref Rng & Units 07/03/2016  WBC     4.0 - 10.3 10e3/uL 11.6 (H)  NEUT#     1.5 - 6.5 10e3/uL 8.1 (H)  Hemoglobin     13.0 - 17.1 g/dL 7.5 (L)  HCT     38.4 - 49.9 % 26.5 (L)  Platelets     140 - 400 10e3/uL 362  MCV     79.3 - 98.0 fL 61.4 (L)  MCH     27.2 - 33.4 pg 17.4 (L)  MCHC     32.0 - 36.0 g/dL 28.3 (L)  RBC     4.20 - 5.82 10e6/uL 4.31  RDW     11.0 - 14.6 % 20.7 (H)  lymph#     0.9 - 3.3 10e3/uL 2.1  MONO#     0.1 - 0.9 10e3/uL 0.6  Eosinophils Absolute     0.0 - 0.5 10e3/uL 0.6 (H)  Basophils Absolute     0.0 - 0.1 10e3/uL 0.1  NEUT%     39.0 - 75.0 % 70.4  LYMPH%     14.0 - 49.0 % 18.1  MONO%     0.0 - 14.0 % 5.5  EOS%     0.0 - 7.0 % 4.9  BASO%     0.0 - 2.0 % 1.1  Retic %     0.80 - 1.80 % 1.75  Retic Ct Abs     34.80 - 93.90 10e3/uL 75.43  Immature Retic Fract     3.00 - 10.60 % 20.30 (H)  Sodium     136 - 145 mEq/L 138  Potassium     3.5 - 5.1 mEq/L 4.0  Chloride     98 - 109 mEq/L 104  CO2     22 - 29 mEq/L 25  Glucose     70 - 140 mg/dl 87  BUN     7.0 - 26.0 mg/dL 7.7  Creatinine     0.7 - 1.3 mg/dL 0.9  Total Bilirubin     0.20 - 1.20 mg/dL 1.03  Alkaline Phosphatase     40 - 150 U/L 85  AST     5 - 34 U/L 17  ALT     0 - 55 U/L 13  Total Protein     6.4 - 8.3 g/dL 7.8  Albumin     3.5 - 5.0 g/dL 4.1  Calcium     8.4 - 10.4 mg/dL 9.7  Anion gap     3 - 11 mEq/L 9  EGFR     >90 ml/min/1.73 m2 >90  PTT-LA     0.0 - 51.9 sec 36.5  DRVVT     0.0 - 47.0 sec 41.0  Lupus Anticoag Interp      Comment:  Beta-2 Glycoprotein I Ab, IgG     0 - 20 GPI IgG units >150 (H)  Beta-2 Glyco 1 IgA     0 - 25 GPI IgA units <9  Beta-2 Glyco 1 IgM     0 - 32 GPI IgM units <9  Anticardiolipin Ab,IgG,Qn     0 - 14 GPL U/mL <9  Anticardiolipin Ab,IgM,Qn     0 - 12 MPL U/mL 11  Anticardiolipin Ab,IgA,Qn     0  - 11 APL U/mL <9  Homocysteine     0.0 - 15.0 umol/L 9.7  Assessment  1) Symptomatic microcytic anemia --likely related to acute/subacute blood loss. 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 Plan -I called and explained the results in details to the patient -he has a complex situation where he is likely bleeding, needs anticoagulation and has an upcoming vascular surgery planned for 07/11/2016 -discussed with Vascular sx RN that surgery might need to be delayed till etiology of anemia is determined so that stability of anticoagulation and final recommendation regarding anticoagulation/anti platelet therapy would be possible.  Patient was advised to  - hold his lovenox  -hold plavix -go to ER ASAP for PRBC transfusion to hgb of 9 -urgent GI consultation for EGD/colonoscopy -absolutely no NSAIDS -if hgb stable and GI workup negative --would need to get back on anticoagulation and atleast 1 antiplatelet therapy. -I will see him in the hospital tomorrow  Sullivan Lone MD MS

## 2016-07-06 NOTE — ED Triage Notes (Addendum)
Pt sent here for anemia, Hgb 7.6 yesterday. Pt has femoral bypass coming up, but was sent here to evaluate anemia and get blood transfusions prior to this. Pt has not had any blood in his stool or dark color stool.

## 2016-07-06 NOTE — ED Notes (Signed)
Chris from lab informed critical Hgb 6.5, will notify RN and MD.

## 2016-07-06 NOTE — Telephone Encounter (Signed)
Phone call from [place] with [facility] Anesthesia, indicating Hgb of 7.6 on 07/05/16, as part of his pre-op work-up.  Voiced concern if Hematologist was made aware of the low hgb. Phone call to Hematology; spoke with Dr. Irene Limbo.  Stated he saw pt. prior to the most recent Hgb.  Per Dr. Irene Limbo, he will contact the pt. for re-evaluation and will recommend a GI consultation.  Advised that unless (R) LE circulation is considered a critical situation, the pt's Fem-Fem Bypass should be delayed.

## 2016-07-07 ENCOUNTER — Other Ambulatory Visit: Payer: Self-pay

## 2016-07-07 ENCOUNTER — Encounter (HOSPITAL_COMMUNITY): Payer: Self-pay | Admitting: Nurse Practitioner

## 2016-07-07 DIAGNOSIS — F419 Anxiety disorder, unspecified: Secondary | ICD-10-CM | POA: Diagnosis not present

## 2016-07-07 DIAGNOSIS — D123 Benign neoplasm of transverse colon: Secondary | ICD-10-CM | POA: Diagnosis not present

## 2016-07-07 DIAGNOSIS — D62 Acute posthemorrhagic anemia: Secondary | ICD-10-CM | POA: Diagnosis present

## 2016-07-07 DIAGNOSIS — K219 Gastro-esophageal reflux disease without esophagitis: Secondary | ICD-10-CM

## 2016-07-07 DIAGNOSIS — R760 Raised antibody titer: Secondary | ICD-10-CM

## 2016-07-07 DIAGNOSIS — D649 Anemia, unspecified: Secondary | ICD-10-CM | POA: Diagnosis not present

## 2016-07-07 DIAGNOSIS — D6861 Antiphospholipid syndrome: Secondary | ICD-10-CM | POA: Diagnosis not present

## 2016-07-07 DIAGNOSIS — I739 Peripheral vascular disease, unspecified: Secondary | ICD-10-CM | POA: Diagnosis not present

## 2016-07-07 DIAGNOSIS — I745 Embolism and thrombosis of iliac artery: Secondary | ICD-10-CM

## 2016-07-07 DIAGNOSIS — D72829 Elevated white blood cell count, unspecified: Secondary | ICD-10-CM | POA: Diagnosis not present

## 2016-07-07 DIAGNOSIS — E785 Hyperlipidemia, unspecified: Secondary | ICD-10-CM | POA: Diagnosis not present

## 2016-07-07 DIAGNOSIS — D500 Iron deficiency anemia secondary to blood loss (chronic): Secondary | ICD-10-CM | POA: Diagnosis not present

## 2016-07-07 DIAGNOSIS — D509 Iron deficiency anemia, unspecified: Secondary | ICD-10-CM | POA: Diagnosis not present

## 2016-07-07 DIAGNOSIS — K644 Residual hemorrhoidal skin tags: Secondary | ICD-10-CM | POA: Diagnosis not present

## 2016-07-07 LAB — IRON AND TIBC
IRON: 14 ug/dL — AB (ref 45–182)
SATURATION RATIOS: 3 % — AB (ref 17.9–39.5)
TIBC: 498 ug/dL — ABNORMAL HIGH (ref 250–450)
UIBC: 484 ug/dL

## 2016-07-07 LAB — OCCULT BLOOD X 1 CARD TO LAB, STOOL
Fecal Occult Bld: NEGATIVE
Fecal Occult Bld: NEGATIVE

## 2016-07-07 LAB — ABO/RH: ABO/RH(D): A POS

## 2016-07-07 LAB — HEMOGLOBIN AND HEMATOCRIT, BLOOD
HEMATOCRIT: 32.5 % — AB (ref 39.0–52.0)
Hemoglobin: 9.3 g/dL — ABNORMAL LOW (ref 13.0–17.0)

## 2016-07-07 LAB — FERRITIN: Ferritin: 3 ng/mL — ABNORMAL LOW (ref 24–336)

## 2016-07-07 LAB — RETICULOCYTES
RBC.: 4.79 MIL/uL (ref 4.22–5.81)
RETIC COUNT ABSOLUTE: 47.9 10*3/uL (ref 19.0–186.0)
Retic Ct Pct: 1 % (ref 0.4–3.1)

## 2016-07-07 LAB — VITAMIN B12: VITAMIN B 12: 503 pg/mL (ref 180–914)

## 2016-07-07 LAB — BASIC METABOLIC PANEL
ANION GAP: 7 (ref 5–15)
BUN: 6 mg/dL (ref 6–20)
CALCIUM: 8.9 mg/dL (ref 8.9–10.3)
CHLORIDE: 109 mmol/L (ref 101–111)
CO2: 24 mmol/L (ref 22–32)
Creatinine, Ser: 1.01 mg/dL (ref 0.61–1.24)
GFR calc non Af Amer: >60 mL/min (ref >60)
Glucose, Bld: 95 mg/dL (ref 65–99)
Potassium: 4.1 mmol/L (ref 3.5–5.1)
SODIUM: 140 mmol/L (ref 135–145)

## 2016-07-07 LAB — FOLATE: FOLATE: 15.9 ng/mL (ref >5.9)

## 2016-07-07 LAB — PREPARE RBC (CROSSMATCH)

## 2016-07-07 LAB — GLUCOSE, CAPILLARY: GLUCOSE-CAPILLARY: 111 mg/dL — AB (ref 65–99)

## 2016-07-07 MED ORDER — PANTOPRAZOLE SODIUM 40 MG IV SOLR
40.0000 mg | Freq: Two times a day (BID) | INTRAVENOUS | Status: DC
  Administered 2016-07-07 – 2016-07-08 (×4): 40 mg via INTRAVENOUS
  Filled 2016-07-07 (×5): qty 40

## 2016-07-07 MED ORDER — LORAZEPAM 0.5 MG PO TABS: 0.5000 mg | ORAL_TABLET | ORAL | Status: DC | PRN

## 2016-07-07 MED ORDER — ONDANSETRON HCL 4 MG PO TABS
4.0000 mg | ORAL_TABLET | Freq: Four times a day (QID) | ORAL | Status: DC | PRN
  Administered 2016-07-07: 4 mg via ORAL
  Filled 2016-07-07: qty 1

## 2016-07-07 MED ORDER — GABAPENTIN 100 MG PO CAPS
200.0000 mg | ORAL_CAPSULE | Freq: Every day | ORAL | Status: DC
  Administered 2016-07-07 (×2): 200 mg via ORAL
  Filled 2016-07-07 (×2): qty 2

## 2016-07-07 MED ORDER — SODIUM CHLORIDE 0.9% FLUSH: 3.0000 mL | INTRAVENOUS | Status: DC | PRN

## 2016-07-07 MED ORDER — BISACODYL 5 MG PO TBEC: 5.0000 mg | DELAYED_RELEASE_TABLET | Freq: Every day | ORAL | Status: DC | PRN

## 2016-07-07 MED ORDER — HYDROCODONE-ACETAMINOPHEN 5-325 MG PO TABS: 1.0000 | ORAL_TABLET | ORAL | Status: DC | PRN

## 2016-07-07 MED ORDER — SODIUM CHLORIDE 0.9 % IV SOLN
Freq: Once | INTRAVENOUS | Status: AC
  Administered 2016-07-07: 01:00:00 via INTRAVENOUS

## 2016-07-07 MED ORDER — SODIUM CHLORIDE 0.9% FLUSH
3.0000 mL | Freq: Two times a day (BID) | INTRAVENOUS | Status: DC
  Administered 2016-07-07 – 2016-07-08 (×4): 3 mL via INTRAVENOUS

## 2016-07-07 MED ORDER — ROSUVASTATIN CALCIUM 5 MG PO TABS
5.0000 mg | ORAL_TABLET | Freq: Every day | ORAL | Status: DC
  Administered 2016-07-07 – 2016-07-08 (×2): 5 mg via ORAL
  Filled 2016-07-07 (×2): qty 1

## 2016-07-07 MED ORDER — SODIUM CHLORIDE 0.9 % IV SOLN
510.0000 mg | Freq: Once | INTRAVENOUS | Status: AC
  Administered 2016-07-07: 510 mg via INTRAVENOUS
  Filled 2016-07-07: qty 17

## 2016-07-07 MED ORDER — POLYETHYLENE GLYCOL 3350 17 G PO PACK: 17.0000 g | PACK | Freq: Every day | ORAL | Status: DC | PRN

## 2016-07-07 MED ORDER — SODIUM CHLORIDE 0.9 % IV SOLN: 250.0000 mL | INTRAVENOUS | Status: DC | PRN

## 2016-07-07 MED ORDER — PEG 3350-KCL-NA BICARB-NACL 420 G PO SOLR
4000.0000 mL | Freq: Once | ORAL | Status: AC
  Administered 2016-07-07: 4000 mL via ORAL

## 2016-07-07 MED ORDER — VARENICLINE TARTRATE 1 MG PO TABS
1.0000 mg | ORAL_TABLET | Freq: Two times a day (BID) | ORAL | Status: DC
  Administered 2016-07-07 – 2016-07-08 (×3): 1 mg via ORAL
  Filled 2016-07-07 (×4): qty 1

## 2016-07-07 MED ORDER — LINACLOTIDE 290 MCG PO CAPS
290.0000 ug | ORAL_CAPSULE | Freq: Every day | ORAL | Status: AC
Start: 2016-07-07 — End: 2016-07-07
  Administered 2016-07-07: 290 ug via ORAL
  Filled 2016-07-07: qty 1

## 2016-07-07 MED ORDER — ONDANSETRON HCL 4 MG/2ML IJ SOLN: 4.0000 mg | Freq: Four times a day (QID) | INTRAMUSCULAR | Status: DC | PRN

## 2016-07-07 MED ORDER — SODIUM CHLORIDE 0.9% FLUSH
3.0000 mL | Freq: Two times a day (BID) | INTRAVENOUS | Status: DC
  Administered 2016-07-07 – 2016-07-08 (×2): 3 mL via INTRAVENOUS

## 2016-07-07 MED ORDER — ACETAMINOPHEN 325 MG PO TABS: 650.0000 mg | ORAL_TABLET | Freq: Four times a day (QID) | ORAL | Status: DC | PRN

## 2016-07-07 MED ORDER — ACETAMINOPHEN 650 MG RE SUPP: 650.0000 mg | Freq: Four times a day (QID) | RECTAL | Status: DC | PRN

## 2016-07-07 NOTE — Consult Note (Signed)
   [facility] CM Inpatient Consult   07/07/2016  [name] 1966/01/27 [id number]    Came to visit [name] at bedside on behalf of [place] to [employer] program for [employer] employees/dependents with [employer]. Discussed Link to [employer]. Denies any needs for Link to [employer]. Discussed that he will receive post hospital discharge call. He is agreeable to this. Confirmed best contact number as [phone]. Appreciative of visit. Provided Link to [employer] and contact information.   Marthenia Rolling, MSN-Ed, RN,BSN [facility] Liaison [phone]

## 2016-07-07 NOTE — H&P (Signed)
History and Physical    [name] [id number] DOB: 30-Sep-1965 DOA: 07/06/2016  PCP: PROVIDER NOT IN SYSTEM   Patient coming from: Home  Chief Complaint: Sent by hematologist for low and falling Hgb  HPI: [name] is a 51 y.o. 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 Patient had CBC drawn yesterday as part of preoperative evaluation prior to lower extremity arterial bypass, and was noted to have hemoglobin of 7.6, down from 10.2 in December. Patient was contacted by his hematologist and directed to the emergency department for further evaluation of his dropping hemoglobin. 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 This was treated with stent, but the stent became thrombosed by December 2017 despite being on Plavix and workup at that time was suggestive of antiphospholipid antibody syndrome. Aspirin was added to his Plavix, he was referred to hematology, and started on Lovenox on 07/03/2016. Patient denies any melena or hematochezia and denies any significant abdominal pain. He does endorse recent increase in his chronic GERD symptoms. He takes Nexium daily and when necessary Zantac at home. He denies recent use of NSAIDs. There's been no chest pain or palpitations and no lightheadedness or presyncope.  ED Course: Upon arrival to the ED, patient is found to be afebrile, saturating well on room air, and with vital signs otherwise stable. Chemistry panel is unremarkable and CBC is notable for a very mild leukocytosis to 10,600 and a microcytic anemia with hemoglobin of 6.5 and MCV of 65.7. Hemoglobin was 7.6 yesterday and 10.2 in December. INR is 1.14. Type and screen was  performed in the ED and 2 units of red blood cells were ordered for immediate transfusion. Patient has remained hemodynamically stable in the ED and is not in any respiratory distress. He will be admitted to the telemetry unit for ongoing evaluation and management of acute microcytic anemia while on Lovenox, aspirin, and Plavix.  Review of Systems:  All other systems reviewed and apart from HPI, are negative.  Past Medical History:  Diagnosis Date  . Anxiety   . Blood dyscrasia    clotting  disorder  . Dyspnea    w/ exertion    hx pleuralsy yrs ago  . GERD (gastroesophageal reflux disease)   . Hyperlipidemia     Past Surgical History:  Procedure Laterality Date  . KNEE SURGERY    . neck surger    . NECK SURGERY    . PERIPHERAL VASCULAR CATHETERIZATION N/A 02/24/2016   Procedure: Abdominal Aortogram w/Lower Extremity;  Surgeon: Conrad [place], MD;  Location: [facility];  Service: Cardiovascular;  Laterality: N/A;  . PERIPHERAL VASCULAR CATHETERIZATION Right 02/24/2016   Procedure: Peripheral Vascular Intervention;  Surgeon: Conrad [place], MD;  Location: [facility];  Service: Cardiovascular;  Laterality: Right;  Common iliac  . PERIPHERAL VASCULAR CATHETERIZATION N/A 05/11/2016   Procedure: Abdominal Aortogram;  Surgeon: Conrad [place], MD;  Location: [facility];  Service: Cardiovascular;  Laterality: N/A;  . PERIPHERAL VASCULAR CATHETERIZATION N/A 05/11/2016   Procedure: Lower Extremity Angiography;  Surgeon: Conrad , MD;  Location: [facility];  Service: Cardiovascular;  Laterality: N/A;     reports that he has been smoking Cigarettes and E-cigarettes.  He has  been smoking about 0.50 packs per day. He has never used smokeless tobacco. He reports that he does not drink alcohol or use drugs.  No Known Allergies  Family History  Problem Relation Age of Onset  . Cancer Father     Lung     Prior to Admission medications   Medication Sig Start Date  End Date Taking? Authorizing Provider  enoxaparin (LOVENOX) 80 MG/0.8ML injection Inject 0.8 mLs (80 mg total) into the skin every 12 (twelve) hours. 07/03/16  Yes Brunetta Genera, MD  esomeprazole (NEXIUM) 20 MG capsule Take 20 mg by mouth daily.    Yes Historical Provider, MD  gabapentin (NEURONTIN) 100 MG capsule Take 2 capsules (200 mg total) by mouth 3 (three) times daily. Patient taking differently: Take 200 mg by mouth at bedtime.  01/11/16  Yes Lyndal Pulley, DO  ranitidine (ZANTAC) 150 MG tablet Take 150 mg by mouth daily as needed for heartburn.   Yes Historical Provider, MD  rosuvastatin (CRESTOR) 5 MG tablet Take 1 tablet (5 mg total) by mouth daily. 06/27/16  Yes Pixie Casino, MD  varenicline (CHANTIX STARTING MONTH PAK) 0.5 MG X 11 & 1 MG X 42 tablet Take as directed per package instructions. Patient taking differently: Take 1 mg by mouth daily. Take as directed per package instructions. 06/02/16  Yes Pixie Casino, MD  mupirocin ointment (BACTROBAN) 2 % Apply 1 application topically daily.  07/05/16   Historical Provider, MD  varenicline (CHANTIX CONTINUING MONTH PAK) 1 MG tablet Take 1 tablet (1 mg total) by mouth 2 (two) times daily. Patient not taking: Reported on 07/06/2016 06/02/16   Pixie Casino, MD    Physical Exam: Vitals:   07/06/16 2137 07/06/16 2200 07/06/16 2218 07/06/16 2254  BP: 121/80 132/82 120/85 127/87  Pulse: 76 89 94 85  Resp: 18  18 18   Temp:      TempSrc:      SpO2: 94% 100% 99% 95%  Weight:      Height:          Constitutional: NAD, calm, comfortable Eyes: PERTLA, lids normal. Pale conjunctiva.  ENMT: Mucous membranes are moist. Posterior pharynx clear of any exudate or lesions.   Neck: normal, supple, no masses, no thyromegaly Respiratory: clear to auscultation bilaterally, no wheezing, no crackles. Normal respiratory effort. No accessory muscle use.  Cardiovascular: S1 & S2 heard, regular rate and rhythm, hyperdynamic precordium. No  significant JVD. Abdomen: No distension, no tenderness, no masses palpated. Bowel sounds normal.  Musculoskeletal: no clubbing / cyanosis. No joint deformity upper and lower extremities. Normal muscle tone.  Skin: no significant rashes, lesions, ulcers. Warm, dry, well-perfused.  Neurologic: CN 2-12 grossly intact. Sensation intact, DTR normal. Strength 5/5 in all 4 limbs.  Psychiatric: Normal judgment and insight. Alert and oriented x 3. Normal mood and affect.     Labs on Admission: I have personally reviewed following labs and imaging studies  CBC:  Recent Labs Lab 07/03/16 1400 07/05/16 0850 07/06/16 2104  WBC 11.6* 11.1* 10.6*  NEUTROABS 8.1*  --   --   HGB 7.5* 7.6* 6.5*  HCT 26.5* 28.8* 24.3*  MCV 61.4* 66.5* 65.7*  PLT 362 308 [id number]   Basic Metabolic Panel:  Recent Labs Lab 07/03/16 1401 07/05/16 0850 07/06/16 2104  NA 138 139 139  K 4.0 3.9 3.9  CL  --  107 106  CO2 25 24 26   GLUCOSE 87 91 88  BUN 7.7 5* 7  CREATININE  0.9 0.90 0.94  CALCIUM 9.7 9.0 9.0   GFR: Estimated Creatinine Clearance: 106.2 mL/min (by C-G formula based on SCr of 0.94 mg/dL). Liver Function Tests:  Recent Labs Lab 07/03/16 1401 07/05/16 0850 07/06/16 2104  AST 17 19 21   ALT 13 12* 14*  ALKPHOS 85 69 60  BILITOT 1.03 0.7 0.8  PROT 7.8 6.8 7.0  ALBUMIN 4.1 3.9 3.9   No results for input(s): LIPASE, AMYLASE in the last 168 hours. No results for input(s): AMMONIA in the last 168 hours. Coagulation Profile:  Recent Labs Lab 07/05/16 0850 07/06/16 2104  INR 1.09 1.14   Cardiac Enzymes: No results for input(s): CKTOTAL, CKMB, CKMBINDEX, TROPONINI in the last 168 hours. BNP (last 3 results) No results for input(s): PROBNP in the last 8760 hours. HbA1C: No results for input(s): HGBA1C in the last 72 hours. CBG: No results for input(s): GLUCAP in the last 168 hours. Lipid Profile: No results for input(s): CHOL, HDL, LDLCALC, TRIG, CHOLHDL, LDLDIRECT in the last 72  hours. Thyroid Function Tests: No results for input(s): TSH, T4TOTAL, FREET4, T3FREE, THYROIDAB in the last 72 hours. Anemia Panel: No results for input(s): VITAMINB12, FOLATE, FERRITIN, TIBC, IRON, RETICCTPCT in the last 72 hours. Urine analysis:    Component Value Date/Time   COLORURINE YELLOW 07/05/2016 0850   APPEARANCEUR CLEAR 07/05/2016 0850   LABSPEC 1.017 07/05/2016 0850   PHURINE 5.0 07/05/2016 0850   GLUCOSEU NEGATIVE 07/05/2016 0850   HGBUR NEGATIVE 07/05/2016 0850   BILIRUBINUR NEGATIVE 07/05/2016 0850   KETONESUR NEGATIVE 07/05/2016 0850   PROTEINUR NEGATIVE 07/05/2016 0850   UROBILINOGEN 1.0 07/15/2010 1607   NITRITE NEGATIVE 07/05/2016 0850   LEUKOCYTESUR NEGATIVE 07/05/2016 0850   Sepsis Labs: @LABRCNTIP (procalcitonin:4,lacticidven:4) ) Recent Results (from the past 240 hour(s))  Surgical pcr screen     Status: Abnormal   Collection Time: 07/05/16  8:49 AM  Result Value Ref Range Status   MRSA, PCR NEGATIVE NEGATIVE Final   Staphylococcus aureus POSITIVE (A) NEGATIVE Final    Comment:        The Xpert SA Assay (FDA approved for NASAL specimens in patients over 32 years of age), is one component of a comprehensive surveillance program.  Test performance has been validated by [facility] for patients greater than or equal to 31 year old. It is not intended to diagnose infection nor to guide or monitor treatment.      Radiological Exams on Admission: No results found.  EKG: Not performed, will obtain as appropriate.   Assessment/Plan  1. 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 Antiphospholipid antibody syndrome  - Pt is under the care of Dr. Irene Limbo of hematology and recently started on Lovenox  - Lovenox held on admission in light of possible GIB   - Complicated situation; will follow-up hematology recommendations   3. PAD - S/p common iliac stent in October 2017, stent was thrombosed by December 2017 despite taking Plavix; ASA was added and he was tentatively scheduled for bypass on 07/11/16  - Bypass may need to be delayed while his progressive anemia and possible hypercoagulability are further evaluated and stabilized    4. Leukocytosis - Mild leukocytosis to 10.6 noted on admission without fever or apparent focus of infection - Likely reactive, will culture if febrile    5. GERD - No EGD report on file - Worse lately per patient; managed at home with daily Nexium and prn Zantac  - As above, he is started on Protonix 40 mg IV q12h and GI is consulting     DVT prophylaxis: SCD's  Code Status: Full  Family Communication: Wife updated at bedside Disposition Plan: Admit to telemetry Consults called: Gastroenterology Admission status: Inpatient    Vianne Bulls, MD [facility] Pager [phone]  If 7PM-7AM, please contact night-coverage [email or web address] Password [email or web address]  07/07/2016, 12:03 AM

## 2016-07-07 NOTE — ED Notes (Signed)
Attempted to give report 

## 2016-07-07 NOTE — Consult Note (Signed)
[facility]    HEMATOLOGY/ONCOLOGY CONSULTATION NOTE  Date of Service: 07/07/2016  Patient Care Team: Provider Not In System as PCP - General Pixie Casino, MD as Consulting Physician (Cardiology)  CHIEF COMPLAINTS/PURPOSE OF CONSULTATION:  Microcytic BLood loss anemia Beta 2 glycoprotein IgG + concerning for APLA Recent rt common iliac thrombosis  HISTORY OF PRESENTING ILLNESS:   "[name] is a wonderful 51 y.o. 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  Patient has a history of smoking 1-1/2 packs per day for about 40 years, dyslipidemia but no other known significant chronic medical comorbidities. He has not had a primary care physician and routine screenings.  He presented to the vascular surgeon in September/October 2017 with claudication of his lower extremities especially cramping discomfort in his buttocks and right thigh and was noted to have a reduced ABI of 0.8 on the right side. 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 He underwent right common iliac artery stenting with significant improvement in his right lower extremity claudication symptoms. He was apparently placed on Plavix at the time.  He presented again in late December 2017 with acute worsening of his right lower extremity pain and had repeat angiography on 05/11/2016 which showed the previously placed common iliac stent was occluded and this was retreated. No obvious etiology was noted.  Patient was started on aspirin in addition to Plavix. Concern for a clotting disorder versus incomplete stent expansion versus edge dissection were entertained. Patient had anticardiolipin antibody and beta-2 glycoprotein antibody testing. He was noted to have a significantly elevated beta-2  glycoprotein IgG that was concerning for antiphospholipid antibody syndrome.  Patient was tentatively plan to have arterial bypass surgery on 07/11/2016 with Dr. Bridgett Larsson. He has been seen by Dr. Debara Pickett and had a nuclear cardiac stress test which showed a slightly reduced ejection fraction of 44% but negative for ischemia. Repeat echo showed normal ejection fraction."  INTERVAL HISTORY  Patient was called to be admitted through the emergency room. He has received 2 units of PRBCs and feels better. His anticoagulation and antiplatelet therapy is currently on hold. He is scheduled for an EGD and colonoscopy tomorrow to workup for source of GI bleeding and iron deficiency anemia. His iron deficiency was severe and he is agreeable to IV iron which was ordered. No overt GI bleeding noted. No other source of bleeding noted. Was using the a fair amount of over-the-counter ibuprofen which is a concern.  MEDICAL HISTORY:  Past Medical History:  Diagnosis Date  . Anxiety   . Blood dyscrasia    clotting  disorder  . Dyspnea    w/ exertion    hx pleuralsy yrs ago  . GERD (gastroesophageal reflux disease)   . Hyperlipidemia     SURGICAL HISTORY: Past Surgical History:  Procedure Laterality Date  . KNEE SURGERY    . neck surger    . NECK SURGERY    . PERIPHERAL VASCULAR CATHETERIZATION N/A 02/24/2016   Procedure: Abdominal Aortogram w/Lower Extremity;  Surgeon: Conrad [place], MD;  Location: [place] [facility];  Service: Cardiovascular;  Laterality: N/A;  . PERIPHERAL VASCULAR CATHETERIZATION Right 02/24/2016   Procedure: Peripheral Vascular Intervention;  Surgeon: Conrad [place], MD;  Location: [facility];  Service: Cardiovascular;  Laterality: Right;  Common iliac  . PERIPHERAL  VASCULAR CATHETERIZATION N/A 05/11/2016   Procedure: Abdominal Aortogram;  Surgeon: Conrad [place], MD;  Location: [facility];  Service: Cardiovascular;  Laterality: N/A;  . PERIPHERAL VASCULAR CATHETERIZATION N/A  05/11/2016   Procedure: Lower Extremity Angiography;  Surgeon: Conrad [place], MD;  Location: [facility];  Service: Cardiovascular;  Laterality: N/A;    SOCIAL HISTORY: Social History   Social History  . Marital status: Married    Spouse name: N/A  . Number of children: N/A  . Years of education: N/A   Occupational History  . Not on file.   Social History Main Topics  . Smoking status: Current Every Day Smoker    Packs/day: 0.50    Types: Cigarettes, E-cigarettes  . Smokeless tobacco: Never Used     Comment: 1/2 pk per day.  Vaporizor has 0% nicotine.  Trying to quit  . Alcohol use No     Comment: hx  etoh  . Drug use: No  . Sexual activity: Yes   Other Topics Concern  . Not on file   Social History Narrative  . No narrative on file    FAMILY HISTORY: Family History  Problem Relation Age of Onset  . Cancer Father     Lung    ALLERGIES:  has No Known Allergies.  MEDICATIONS:  Current Facility-Administered Medications  Medication Dose Route Frequency Provider Last Rate Last Dose  . 0.9 %  sodium chloride infusion  250 mL Intravenous PRN Vianne Bulls, MD      . acetaminophen (TYLENOL) tablet 650 mg  650 mg Oral Q6H PRN Vianne Bulls, MD       Or  . acetaminophen (TYLENOL) suppository 650 mg  650 mg Rectal Q6H PRN Vianne Bulls, MD      . bisacodyl (DULCOLAX) EC tablet 5 mg  5 mg Oral Daily PRN Vianne Bulls, MD      . gabapentin (NEURONTIN) capsule 200 mg  200 mg Oral QHS Vianne Bulls, MD   200 mg at 07/07/16 2145  . HYDROcodone-acetaminophen (NORCO/VICODIN) 5-325 MG per tablet 1-2 tablet  1-2 tablet Oral Q4H PRN Ilene Qua Opyd, MD      . LORazepam (ATIVAN) tablet 0.5 mg  0.5 mg Oral Q4H PRN Vianne Bulls, MD      . ondansetron (ZOFRAN) tablet 4 mg  4 mg Oral Q6H PRN Vianne Bulls, MD   4 mg at 07/07/16 1117   Or  . ondansetron (ZOFRAN) injection 4 mg  4 mg Intravenous Q6H PRN Vianne Bulls, MD      . pantoprazole (PROTONIX) injection 40 mg  40 mg  Intravenous Q12H Vianne Bulls, MD   40 mg at 07/07/16 2145  . polyethylene glycol (MIRALAX / GLYCOLAX) packet 17 g  17 g Oral Daily PRN Ilene Qua Opyd, MD      . rosuvastatin (CRESTOR) tablet 5 mg  5 mg Oral Daily Vianne Bulls, MD   5 mg at 07/07/16 0900  . sodium chloride flush (NS) 0.9 % injection 3 mL  3 mL Intravenous Q12H Ilene Qua Opyd, MD   3 mL at 07/07/16 1000  . sodium chloride flush (NS) 0.9 % injection 3 mL  3 mL Intravenous Q12H Vianne Bulls, MD   3 mL at 07/07/16 2206  . sodium chloride flush (NS) 0.9 % injection 3 mL  3 mL Intravenous PRN Vianne Bulls, MD      . varenicline (CHANTIX) tablet  1 mg  1 mg Oral BID Vianne Bulls, MD   1 mg at 07/07/16 2145    REVIEW OF SYSTEMS:    10 Point review of Systems was done is negative except as noted above.  PHYSICAL EXAMINATION: ECOG PERFORMANCE STATUS: 1 - Symptomatic but completely ambulatory  . Vitals:   07/07/16 0415 07/07/16 0645  BP: 106/72 123/76  Pulse: 76 69  Resp: 18 18  Temp: 98.7 F (37.1 C) 98.2 F (36.8 C)   Filed Weights   07/06/16 1906 07/07/16 0101  Weight: 192 lb 14.4 oz (87.5 kg) 186 lb 1.1 oz (84.4 kg)   .Body mass index is 27.08 kg/m.  GENERAL:alert, in no acute distress and comfortable SKIN: skin color, texture, turgor are normal, no rashes or significant lesions EYES: normal, conjunctiva are pink and non-injected, sclera clear OROPHARYNX:no exudate, no erythema and lips, buccal mucosa, and tongue normal  NECK: supple, no JVD, thyroid normal size, non-tender, without nodularity LYMPH:  no palpable lymphadenopathy in the cervical, axillary or inguinal LUNGS: clear to auscultation with normal respiratory effort HEART: regular rate & rhythm,  no murmurs and no lower extremity edema ABDOMEN: abdomen soft, non-tender, normoactive bowel sounds  Musculoskeletal: no cyanosis of digits and no clubbing  PSYCH: alert & oriented x 3 with fluent speech NEURO: no focal motor/sensory  deficits  LABORATORY DATA:  I have reviewed the data as listed  . CBC Latest Ref Rng & Units 07/07/2016 07/06/2016  WBC 4.0 - 10.5 K/uL - 10.6(H)  Hemoglobin 13.0 - 17.0 g/dL 9.3(L) 6.5(LL)  Hematocrit 39.0 - 52.0 % 32.5(L) 24.3(L)  Platelets 150 - 400 K/uL - 303    . CMP Latest Ref Rng & Units 07/07/2016 07/06/2016 07/05/2016  Glucose 65 - 99 mg/dL 95 88 91  BUN 6 - 20 mg/dL 6 7 5(L)  Creatinine 0.61 - 1.24 mg/dL 1.01 0.94 0.90  Sodium 135 - 145 mmol/L 140 139 139  Potassium 3.5 - 5.1 mmol/L 4.1 3.9 3.9  Chloride 101 - 111 mmol/L 109 106 107  CO2 22 - 32 mmol/L 24 26 24   Calcium 8.9 - 10.3 mg/dL 8.9 9.0 9.0  Total Protein 6.5 - 8.1 g/dL - 7.0 6.8  Total Bilirubin 0.3 - 1.2 mg/dL - 0.8 0.7  Alkaline Phos 38 - 126 U/L - 60 69  AST 15 - 41 U/L - 21 19  ALT 17 - 63 U/L - 14(L) 12(L)   . Lab Results  Component Value Date   IRON 14 (L) 07/07/2016   TIBC 498 (H) 07/07/2016   IRONPCTSAT 3 (L) 07/07/2016   (Iron and TIBC)  Lab Results  Component Value Date   FERRITIN 3 (L) 07/07/2016      RADIOGRAPHIC STUDIES: I have personally reviewed the radiological images as listed and agreed with the findings in the report. No results found.  ASSESSMENT & PLAN:   1) Severe Iron deficiency Anemia /Symptomatic microcytic anemia --likely related to acute/subacute blood loss. hgb 6.5 MCV 60's Ferritin 3. Hemoglobin has improved to the 9 range after 2 units of PRBCs.  2) Concern for Antiphospholipid antibody syndrome beta-2 glycoprotein IgG positive at high titers.  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 Plan -Patient's Lovenox and antiplatelet therapy is currently on hold for concern for GI bleeding with severe anemia. -Patient has been transferred 2 units of PRBCs with improvement in his hemoglobin to 9. -Informed consent was obtained from him for IV iron. I ordered 1 dose of feraheme 510  mg IV. He will need an additional one or 2 doses which could be  done as outpatient on a weekly basis. -GI has been consulted and he has been scheduled for an EGD and colonoscopy to determine the etiology of his anemia urgently since the findings we'll determine the status of ongoing anticoagulation antiplatelet therapy. -He does need to be on aspirin and anticoagulation from an arterial thrombosis/APLA standpoint when possible. -final Anticoagulation recommendations and antiplatelet therapy recommendations would be based on GI findings and timing of his vascular surgery.   All of the patients questions were answered with apparent satisfaction. The patient knows to call the clinic with any problems, questions or concerns.  I spent 45 minutes counseling the patient face to face. The total time spent in the appointment was 60 minutes and more than 50% was on counseling and direct patient cares    Sullivan Lone MD [place] AAHIVMS [facility] Hematology/Oncology [facility]  (Office):       [phone] (Work cell):  [phone] (Fax):           [phone]  07/07/2016 8:47 AM

## 2016-07-07 NOTE — Consult Note (Signed)
Referring Provider:  Dr. Marylu Lund ([facility]) Primary Care Physician:  PROVIDER NOT IN SYSTEM Primary Gastroenterologist:  None (unassigned)  Reason for Consultation:  Microcytic anemia  HPI: [name] is a 51 y.o. male admitted to the hospital yesterday evening when he was found to have severe anemia at his hematologist's or cardiologist office.  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 The stent clotted and he was transitioned to aspirin in December. 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  At no time has the patient had any overt clinical bleeding such as melena or hematochezia, nor has he been donating blood to the [facility]. In March 2012, hemoglobin was 14.3 with an MCV of 95; in October 2017, hemoglobin was 10.5 and on repeat in December, it was 10.2 (that was on an H&H, MCV not obtained). On February 12, hemoglobin was 7.5 with an MCV of 61. By the time of admission here 3 days later, hemoglobin was 6.5. RDW was elevated at 20.5, platelets normal at 303,000.  Of note, the patient has been on chronic PPI therapy with Nexium 20 mg daily, because of heartburn, for approximately 5 years. His heartburn is generally well controlled by that medication unless she has dietary indiscretions, in which case he may take some supplemental ranitidine.  The patient has never previously had endoscopic or colonoscopic evaluation.  The patient's last dose of Plavix was approximately 5 days ago, and his last dose of Lovenox was one or 2 days ago.   Past Medical History:  Diagnosis Date  . Anxiety   . Blood dyscrasia    clotting  disorder  . Dyspnea    w/  exertion    hx pleuralsy yrs ago  . GERD (gastroesophageal reflux disease)   . Hyperlipidemia     Past Surgical History:  Procedure Laterality Date  . KNEE SURGERY    . neck surger    . NECK SURGERY    . PERIPHERAL VASCULAR CATHETERIZATION N/A 02/24/2016   Procedure: Abdominal Aortogram w/Lower Extremity;  Surgeon: Conrad [place], MD;  Location: [place] [facility];  Service: Cardiovascular;  Laterality: N/A;  . PERIPHERAL VASCULAR CATHETERIZATION Right 02/24/2016   Procedure: Peripheral Vascular Intervention;  Surgeon: Conrad [place], MD;  Location: [place] [facility];  Service: Cardiovascular;  Laterality: Right;  Common iliac  . PERIPHERAL VASCULAR CATHETERIZATION N/A 05/11/2016   Procedure: Abdominal Aortogram;  Surgeon: Conrad [place], MD;  Location: [place] [facility];  Service: Cardiovascular;  Laterality: N/A;  . PERIPHERAL VASCULAR CATHETERIZATION N/A 05/11/2016   Procedure: Lower Extremity Angiography;  Surgeon: Conrad Coates, MD;  Location: [place] [facility];  Service: Cardiovascular;  Laterality: N/A;    Prior to Admission medications   Medication Sig Start Date End Date Taking? Authorizing Provider  enoxaparin (LOVENOX) 80 MG/0.8ML injection Inject 0.8 mLs (80 mg total) into the skin every 12 (twelve) hours. 07/03/16  Yes Brunetta Genera, MD  esomeprazole (NEXIUM) 20 MG capsule Take 20 mg by mouth daily.    Yes Historical Provider, MD  gabapentin (NEURONTIN) 100 MG capsule Take 2 capsules (200 mg total) by mouth 3 (three) times daily. Patient taking differently: Take 200 mg  by mouth at bedtime.  01/11/16  Yes Lyndal Pulley, DO  ranitidine (ZANTAC) 150 MG tablet Take 150 mg by mouth daily as needed for heartburn.   Yes Historical Provider, MD  rosuvastatin (CRESTOR) 5 MG tablet Take 1 tablet (5 mg total) by mouth daily. 06/27/16  Yes Pixie Casino, MD  varenicline (CHANTIX STARTING MONTH PAK) 0.5 MG X 11 & 1 MG X 42 tablet Take as directed per package instructions. Patient taking  differently: Take 1 mg by mouth daily. Take as directed per package instructions. 06/02/16  Yes Pixie Casino, MD  mupirocin ointment (BACTROBAN) 2 % Apply 1 application topically daily.  07/05/16   Historical Provider, MD  varenicline (CHANTIX CONTINUING MONTH PAK) 1 MG tablet Take 1 tablet (1 mg total) by mouth 2 (two) times daily. Patient not taking: Reported on 07/06/2016 06/02/16   Pixie Casino, MD    Current Facility-Administered Medications  Medication Dose Route Frequency Provider Last Rate Last Dose  . 0.9 %  sodium chloride infusion  250 mL Intravenous PRN Vianne Bulls, MD      . acetaminophen (TYLENOL) tablet 650 mg  650 mg Oral Q6H PRN Vianne Bulls, MD       Or  . acetaminophen (TYLENOL) suppository 650 mg  650 mg Rectal Q6H PRN Vianne Bulls, MD      . bisacodyl (DULCOLAX) EC tablet 5 mg  5 mg Oral Daily PRN Vianne Bulls, MD      . gabapentin (NEURONTIN) capsule 200 mg  200 mg Oral QHS Vianne Bulls, MD   200 mg at 07/07/16 0128  . HYDROcodone-acetaminophen (NORCO/VICODIN) 5-325 MG per tablet 1-2 tablet  1-2 tablet Oral Q4H PRN Vianne Bulls, MD      . linaclotide (LINZESS) capsule 290 mcg  290 mcg Oral QAC breakfast Ronald Lobo, MD      . LORazepam (ATIVAN) tablet 0.5 mg  0.5 mg Oral Q4H PRN Vianne Bulls, MD      . ondansetron (ZOFRAN) tablet 4 mg  4 mg Oral Q6H PRN Vianne Bulls, MD       Or  . ondansetron (ZOFRAN) injection 4 mg  4 mg Intravenous Q6H PRN Ilene Qua Opyd, MD      . pantoprazole (PROTONIX) injection 40 mg  40 mg Intravenous Q12H Vianne Bulls, MD   40 mg at 07/07/16 0900  . polyethylene glycol (MIRALAX / GLYCOLAX) packet 17 g  17 g Oral Daily PRN Ilene Qua Opyd, MD      . polyethylene glycol-electrolytes (NuLYTELY/GoLYTELY) solution 4,000 mL  4,000 mL Oral Once Ronald Lobo, MD      . rosuvastatin (CRESTOR) tablet 5 mg  5 mg Oral Daily Vianne Bulls, MD   5 mg at 07/07/16 0900  . sodium chloride flush (NS) 0.9 % injection 3 mL  3 mL  Intravenous Q12H Ilene Qua Opyd, MD   3 mL at 07/07/16 1000  . sodium chloride flush (NS) 0.9 % injection 3 mL  3 mL Intravenous Q12H Ilene Qua Opyd, MD   3 mL at 07/07/16 1000  . sodium chloride flush (NS) 0.9 % injection 3 mL  3 mL Intravenous PRN Vianne Bulls, MD      . varenicline (CHANTIX) tablet 1 mg  1 mg Oral BID Vianne Bulls, MD   1 mg at 07/07/16 0900    Allergies as of 07/06/2016  . (No Known Allergies)    Family History  Problem Relation Age of Onset  . Cancer Father     Lung    Social History   Social History  . Marital status: Married    Spouse name: N/A  . Number of children: N/A  . Years of education: N/A   Occupational History  . Not on file.   Social History Main Topics  . Smoking status: Current Every Day Smoker    Packs/day: 0.50    Types: Cigarettes, E-cigarettes  . Smokeless tobacco: Never Used     Comment: 1/2 pk per day.  Vaporizor has 0% nicotine.  Trying to quit  . Alcohol use No     Comment: hx  etoh  . Drug use: No  . Sexual activity: Yes   Other Topics Concern  . Not on file   Social History Narrative  . No narrative on file    Review of Systems: Negative for hoarseness, chronic cough or dyspnea (he is down to smoking about one half pack per day and is on Chantix), chest pain, constipation or diarrhea, urinary symptoms, lymph node enlargement, skin problems. Claudication, upper GI tract symptoms, and clotting disorder as per history of present illness.  Physical Exam: Vital signs in last 24 hours: Temp:  [97.9 F (36.6 C)-99.2 F (37.3 C)] 98.2 F (36.8 C) (02/16 0645) Pulse Rate:  [69-97] 69 (02/16 0645) Resp:  [18-21] 18 (02/16 0645) BP: (106-135)/(72-90) 123/76 (02/16 0645) SpO2:  [94 %-100 %] 96 % (02/16 0645) Weight:  [84.4 kg (186 lb 1.1 oz)-87.5 kg (192 lb 14.4 oz)] 84.4 kg (186 lb 1.1 oz) (02/16 0101) Last BM Date: 07/07/16 General:   Alert,  Well-developed, well-nourished, pleasant and cooperative in NAD. Wife at  bedside. Head:  Normocephalic and atraumatic. Eyes:  Sclera clear, no icterus.   Conjunctiva pink. Mouth:   No ulcerations or lesions.  Oropharynx pink & moist. Neck:   No masses or thyromegaly. Lungs:  Fairly clear throughout to auscultation.  Good air movement. Soft scattered rhonchi present. No evident respiratory distress. Heart:   Regular rate and rhythm; no murmurs, clicks, rubs,  or gallops. Abdomen:  Soft, nontender, nontympanitic, and nondistended. No masses, hepatosplenomegaly or ventral hernias noted. Normal bowel sounds, without bruits, guarding, or rebound.    Rectal:  No masses, normal prostate, no stool present (mucus submitted for Hemoccult analysis).   Msk:   Symmetrical without gross deformities. Pulses:  Normal pulses noted. Extremities:   Without clubbing, cyanosis, or edema. Neurologic:  Alert and coherent;  grossly normal neurologically. Skin:  Intact without significant lesions or rashes. Does not look pale, following transfusion. Cervical Nodes:  No significant cervical adenopathy. Psych:   Alert and cooperative. Normal mood and affect.  Intake/Output from previous day: 02/15 0701 - 02/16 0700 In: 845 [P.O.:120; Blood:725] Out: 700 [Urine:700] Intake/Output this shift: No intake/output data recorded.  Lab Results:  Recent Labs  07/05/16 0850 07/06/16 2104  WBC 11.1* 10.6*  HGB 7.6* 6.5*  HCT 28.8* 24.3*  PLT 308 303   BMET  Recent Labs  07/05/16 0850 07/06/16 2104  NA 139 139  K 3.9 3.9  CL 107 106  CO2 24 26  GLUCOSE 91 88  BUN 5* 7  CREATININE 0.90 0.94  CALCIUM 9.0 9.0   LFT  Recent Labs  07/06/16 2104  PROT 7.0  ALBUMIN 3.9  AST 21  ALT 14*  ALKPHOS 60  BILITOT 0.8   PT/INR  Recent Labs  07/05/16 0850 07/06/16 2104  LABPROT 14.2 14.7  INR  1.09 1.14    Studies/Results: No results found.  Impression: Ferritin level is pending, but almost certainly this is iron deficiency anemia.  Possible etiologies would include  iron malabsorption (celiac disease or long-standing PPI therapy), or chronic GI blood loss (Hemoccult pending). The patient is not vegetarian, so it is presumed he has adequate dietary iron intake.  Plan: 1. Repeat and await stool Hemoccults 2. Endoscopy (with duodenal biopsies) and colonoscopy tomorrow. Petra Kuba, purpose, and risks reviewed with patient and wife and they are agreeable. Prep orders written. 3. Await iron studies.   LOS: 1 day   Rollande Thursby V  07/07/2016, 10:29 AM   Pager [phone] If no answer or after 5 PM call [phone]

## 2016-07-07 NOTE — Patient Outreach (Signed)
[name]) Care Management  07/07/2016  [name] 1965-08-23 [id number]   Subjective: None.    Objective: per chart review- Surgery planned for 07/11/16 Canceled.   Assessment: RNCM called to follow up re: CM presurgical call. Client noted per chart to be admitted to the hospital for ongoing evaluation and treatment of acute microcytic anemia while on Lovenox, Aspirin and Plavix. 51 year old with History of PAD, dyslipidemia, right iliac thrombosis, GERD, anxiety, antiphospholipid antibody syndrome.   Plan: Close case. RNCM will reopen pending new surgery date.  Thea Silversmith, RN, MSN, [place] Coordinator Cell: [phone]

## 2016-07-07 NOTE — Progress Notes (Signed)
PROGRESS NOTE    [name]  [id number] DOB: [birth year] DOA: 07/06/2016 PCP: PROVIDER NOT IN SYSTEM    Brief Narrative:  51 y.o. 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 Patient had CBC drawn yesterday as part of preoperative evaluation prior to lower extremity arterial bypass, and was noted to have hemoglobin of 7.6, down from 10.2 in December. Patient was contacted by his hematologist and directed to the emergency department for further evaluation of his dropping hemoglobin. 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 This was treated with stent, but the stent became thrombosed by December 2017 despite being on Plavix and workup at that time was suggestive of antiphospholipid antibody syndrome. Aspirin was added to his Plavix, he was referred to hematology, and started on Lovenox on 07/03/2016. Patient denies any melena or hematochezia and denies any significant abdominal pain. He does endorse recent increase in his chronic GERD symptoms. He takes Nexium daily and when necessary Zantac at home. He denies recent use of NSAIDs. There's been no chest pain or palpitations and no lightheadedness or presyncope  Assessment & Plan:   Principal Problem:   Symptomatic anemia Active Problems:   PAD (peripheral artery disease) (HCC)   Antiphospholipid antibody positive   Gastroesophageal reflux disease  1. Acute anemia, microcytic  - Hgb 6.5 on admission with MCV 65.7; Hgb had been 7.6 the day prior and 10.2 in December '17  - Pt denies melena or hematochezia and no stool in rectum to test for occult blood  - Denies recent NSAID's, or alcohol - Had been on ASA and Plavix for PAD  with stent, then started therapeutic Lovenox on 07/03/16 for suspected antiphospholipid antibody syndrome  - Pt was sent by hematology who plans to see patient in hospital and advises holding antiplatelets and anticoag - GI consulted. Seen patient with Dr. Cristina Gong, appreciate recommendations. Recs for EGD and colonoscopy tomorrow. - ASA, Plavix, and Lovenox held on admission;  - Patient is s/p  2 units pRBCs   - Will continue Protonix 40 mg IV BID for now   2. Antiphospholipid antibody syndrome  - Pt is under the care of Dr. Irene Limbo of hematology and recently started on Lovenox  - Lovenox held on admission in light of possible GIB   - Complicated situation - Dr. Irene Limbo to follow up. Will follow-up hematology recommendations   3. PAD - S/p common iliac stent in October 2017, stent was thrombosed by December 2017 despite taking Plavix; ASA was added and he was tentatively scheduled for bypass on 07/11/16  - Stable at present    4. Leukocytosis - Mild leukocytosis to 10.6 noted on admission without fever or apparent focus of infection - Likely reactive - Clinically stable  5. GERD - No EGD report on file - Worse lately per patient; managed at home with daily Nexium and prn Zantac  - As above, he is started on Protonix 40 mg IV q12h - GI consulted. Plans for endoscopy 2/17    DVT prophylaxis: SCD's Code Status: Full Family Communication: Pt in room, family at bedside Disposition Plan: uncertain at this time  Consultants:   GI  Hematology  Procedures:     Antimicrobials: Anti-infectives    None  Subjective: No complaints  Objective: Vitals:   07/07/16 0130 07/07/16 0345 07/07/16 0415 07/07/16 0645  BP: 127/79 114/77 106/72 123/76  Pulse: 74 75 76 69  Resp: 18 18 18 18   Temp: 98.8 F (37.1 C) 98.7 F (37.1 C) 98.7 F (37.1 C) 98.2 F (36.8 C)  TempSrc: Oral Oral Oral Oral  SpO2: 98% 97% 98% 96%  Weight:      Height:        Intake/Output Summary (Last  24 hours) at 07/07/16 1427 Last data filed at 07/07/16 0645  Gross per 24 hour  Intake              845 ml  Output              700 ml  Net              145 ml   Filed Weights   07/06/16 1906 07/07/16 0101  Weight: 87.5 kg (192 lb 14.4 oz) 84.4 kg (186 lb 1.1 oz)    Examination:  General exam: Appears calm and comfortable  Respiratory system: Clear to auscultation. Respiratory effort normal. Cardiovascular system: S1 & S2 heard, RRR Gastrointestinal system: Abdomen is nondistended, soft and nontender. No organomegaly or masses felt. Normal bowel sounds heard. Central nervous system: Alert and oriented. No focal neurological deficits. Extremities: Symmetric 5 x 5 power. Skin: No rashes, lesions Psychiatry: Judgement and insight appear normal. Mood & affect appropriate.   Data Reviewed: I have personally reviewed following labs and imaging studies  CBC:  Recent Labs Lab 07/03/16 1400 07/05/16 0850 07/06/16 2104 07/07/16 1020  WBC 11.6* 11.1* 10.6*  --   NEUTROABS 8.1*  --   --   --   HGB 7.5* 7.6* 6.5* 9.3*  HCT 26.5* 28.8* 24.3* 32.5*  MCV 61.4* 66.5* 65.7*  --   PLT 362 308 303  --    Basic Metabolic Panel:  Recent Labs Lab 07/03/16 1401 07/05/16 0850 07/06/16 2104 07/07/16 1020  NA 138 139 139 140  K 4.0 3.9 3.9 4.1  CL  --  107 106 109  CO2 25 24 26 24   GLUCOSE 87 91 88 95  BUN 7.7 5* 7 6  CREATININE 0.9 0.90 0.94 1.01  CALCIUM 9.7 9.0 9.0 8.9   GFR: Estimated Creatinine Clearance: 91 mL/min (by C-G formula based on SCr of 1.01 mg/dL). Liver Function Tests:  Recent Labs Lab 07/03/16 1401 07/05/16 0850 07/06/16 2104  AST 17 19 21   ALT 13 12* 14*  ALKPHOS 85 69 60  BILITOT 1.03 0.7 0.8  PROT 7.8 6.8 7.0  ALBUMIN 4.1 3.9 3.9   No results for input(s): LIPASE, AMYLASE in the last 168 hours. No results for input(s): AMMONIA in the last 168 hours. Coagulation Profile:  Recent Labs Lab 07/05/16 0850 07/06/16 2104  INR 1.09 1.14   Cardiac  Enzymes: No results for input(s): CKTOTAL, CKMB, CKMBINDEX, TROPONINI in the last 168 hours. BNP (last 3 results) No results for input(s): PROBNP in the last 8760 hours. HbA1C: No results for input(s): HGBA1C in the last 72 hours. CBG:  Recent Labs Lab 07/07/16 0726  GLUCAP 111*   Lipid Profile: No results for input(s): CHOL, HDL, LDLCALC, TRIG, CHOLHDL, LDLDIRECT in the last 72 hours. Thyroid Function Tests: No results for input(s): TSH, T4TOTAL, FREET4, T3FREE, THYROIDAB in the last 72 hours. Anemia Panel:  Recent Labs  07/07/16 1020  VITAMINB12 503  FOLATE 15.9  FERRITIN 3*  TIBC 498*  IRON 14*  RETICCTPCT 1.0   Sepsis Labs: No results for input(s): PROCALCITON, LATICACIDVEN in the last 168 hours.  Recent Results (from the past 240 hour(s))  Surgical pcr screen     Status: Abnormal   Collection Time: 07/05/16  8:49 AM  Result Value Ref Range Status   MRSA, PCR NEGATIVE NEGATIVE Final   Staphylococcus aureus POSITIVE (A) NEGATIVE Final    Comment:        The Xpert SA Assay (FDA approved for NASAL specimens in patients over 13 years of age), is one component of a comprehensive surveillance program.  Test performance has been validated by [facility] for patients greater than or equal to 68 year old. It is not intended to diagnose infection nor to guide or monitor treatment.      Radiology Studies: No results found.  Scheduled Meds: . ferumoxytol  510 mg Intravenous Once  . gabapentin  200 mg Oral QHS  . pantoprazole (PROTONIX) IV  40 mg Intravenous Q12H  . polyethylene glycol-electrolytes  4,000 mL Oral Once  . rosuvastatin  5 mg Oral Daily  . sodium chloride flush  3 mL Intravenous Q12H  . sodium chloride flush  3 mL Intravenous Q12H  . varenicline  1 mg Oral BID   Continuous Infusions:   LOS: 1 day   Mallorie Norrod, Orpah Melter, MD [facility] Pager [phone]  If 7PM-7AM, please contact night-coverage [email or web address] Password [facility] 07/07/2016,  2:27 PM

## 2016-07-08 ENCOUNTER — Encounter (HOSPITAL_COMMUNITY)
Admission: EM | Disposition: A | Discharge status: Home / Self Care | Payer: Self-pay | Source: Home / Self Care | Attending: Physician Assistant

## 2016-07-08 ENCOUNTER — Encounter (HOSPITAL_COMMUNITY): Payer: Self-pay | Admitting: *Deleted

## 2016-07-08 ENCOUNTER — Encounter: Payer: Self-pay | Admitting: Family Medicine

## 2016-07-08 DIAGNOSIS — K644 Residual hemorrhoidal skin tags: Secondary | ICD-10-CM | POA: Diagnosis not present

## 2016-07-08 DIAGNOSIS — R760 Raised antibody titer: Secondary | ICD-10-CM | POA: Diagnosis not present

## 2016-07-08 DIAGNOSIS — I739 Peripheral vascular disease, unspecified: Secondary | ICD-10-CM | POA: Diagnosis not present

## 2016-07-08 DIAGNOSIS — E785 Hyperlipidemia, unspecified: Secondary | ICD-10-CM | POA: Diagnosis not present

## 2016-07-08 DIAGNOSIS — M25512 Pain in left shoulder: Secondary | ICD-10-CM

## 2016-07-08 DIAGNOSIS — K449 Diaphragmatic hernia without obstruction or gangrene: Secondary | ICD-10-CM | POA: Diagnosis not present

## 2016-07-08 DIAGNOSIS — G8929 Other chronic pain: Secondary | ICD-10-CM | POA: Insufficient documentation

## 2016-07-08 DIAGNOSIS — D649 Anemia, unspecified: Secondary | ICD-10-CM | POA: Diagnosis not present

## 2016-07-08 DIAGNOSIS — D500 Iron deficiency anemia secondary to blood loss (chronic): Secondary | ICD-10-CM

## 2016-07-08 DIAGNOSIS — F419 Anxiety disorder, unspecified: Secondary | ICD-10-CM | POA: Diagnosis not present

## 2016-07-08 DIAGNOSIS — D72829 Elevated white blood cell count, unspecified: Secondary | ICD-10-CM | POA: Diagnosis not present

## 2016-07-08 DIAGNOSIS — K219 Gastro-esophageal reflux disease without esophagitis: Secondary | ICD-10-CM | POA: Diagnosis not present

## 2016-07-08 DIAGNOSIS — D509 Iron deficiency anemia, unspecified: Secondary | ICD-10-CM | POA: Diagnosis not present

## 2016-07-08 DIAGNOSIS — D123 Benign neoplasm of transverse colon: Secondary | ICD-10-CM | POA: Diagnosis not present

## 2016-07-08 DIAGNOSIS — D6861 Antiphospholipid syndrome: Secondary | ICD-10-CM | POA: Diagnosis not present

## 2016-07-08 HISTORY — PX: COLONOSCOPY: SHX5424

## 2016-07-08 HISTORY — PX: ESOPHAGOGASTRODUODENOSCOPY: SHX5428

## 2016-07-08 LAB — CBC
HCT: 34 % — ABNORMAL LOW (ref 39.0–52.0)
HEMOGLOBIN: 9.7 g/dL — AB (ref 13.0–17.0)
MCH: 19.7 pg — AB (ref 26.0–34.0)
MCHC: 28.5 g/dL — ABNORMAL LOW (ref 30.0–36.0)
MCV: 69 fL — AB (ref 78.0–100.0)
PLATELETS: 259 10*3/uL (ref 150–400)
RBC: 4.93 MIL/uL (ref 4.22–5.81)
RDW: 20.6 % — ABNORMAL HIGH (ref 11.5–15.5)
WBC: 9.5 10*3/uL (ref 4.0–10.5)

## 2016-07-08 LAB — TYPE AND SCREEN
BLOOD PRODUCT EXPIRATION DATE: 201803082359
Blood Product Expiration Date: 201803082359
ISSUE DATE / TIME: 201802160108
ISSUE DATE / TIME: 201802160346
Unit Type and Rh: 6200
Unit Type and Rh: 6200

## 2016-07-08 LAB — GLUCOSE, CAPILLARY: Glucose-Capillary: 105 mg/dL — ABNORMAL HIGH (ref 65–99)

## 2016-07-08 SURGERY — EGD (ESOPHAGOGASTRODUODENOSCOPY)
Anesthesia: Moderate Sedation

## 2016-07-08 MED ORDER — FENTANYL CITRATE (PF) 100 MCG/2ML IJ SOLN
INTRAMUSCULAR | Status: DC | PRN
  Administered 2016-07-08 (×4): 25 ug via INTRAVENOUS

## 2016-07-08 MED ORDER — DIPHENHYDRAMINE HCL 50 MG/ML IJ SOLN
INTRAMUSCULAR | Status: DC | PRN
  Administered 2016-07-08: 25 mg via INTRAVENOUS

## 2016-07-08 MED ORDER — FERROUS SULFATE 325 (65 FE) MG PO TABS: 325.0000 mg | ORAL_TABLET | Freq: Every day | ORAL | 0 refills | Status: DC

## 2016-07-08 MED ORDER — ENOXAPARIN SODIUM 80 MG/0.8ML ~~LOC~~ SOLN
80.0000 mg | Freq: Two times a day (BID) | SUBCUTANEOUS | Status: DC
  Administered 2016-07-08: 80 mg via SUBCUTANEOUS
  Filled 2016-07-08: qty 0.8

## 2016-07-08 MED ORDER — FENTANYL CITRATE (PF) 100 MCG/2ML IJ SOLN
INTRAMUSCULAR | Status: AC
  Filled 2016-07-08: qty 2

## 2016-07-08 MED ORDER — DIPHENHYDRAMINE HCL 50 MG/ML IJ SOLN
INTRAMUSCULAR | Status: AC
  Filled 2016-07-08: qty 1

## 2016-07-08 MED ORDER — SODIUM CHLORIDE 0.9 % IV SOLN: INTRAVENOUS | Status: DC

## 2016-07-08 MED ORDER — MIDAZOLAM HCL 5 MG/ML IJ SOLN
INTRAMUSCULAR | Status: AC
  Filled 2016-07-08: qty 2

## 2016-07-08 MED ORDER — MIDAZOLAM HCL 10 MG/2ML IJ SOLN
INTRAMUSCULAR | Status: DC | PRN
  Administered 2016-07-08: 1 mg via INTRAVENOUS
  Administered 2016-07-08 (×2): 2 mg via INTRAVENOUS
  Administered 2016-07-08: 1 mg via INTRAVENOUS
  Administered 2016-07-08: 2 mg via INTRAVENOUS
  Administered 2016-07-08: 1 mg via INTRAVENOUS

## 2016-07-08 MED ORDER — DOCUSATE SODIUM 100 MG PO CAPS: 100.0000 mg | ORAL_CAPSULE | Freq: Two times a day (BID) | ORAL | 0 refills | Status: DC

## 2016-07-08 NOTE — Op Note (Signed)
[facility] Patient Name: [name] Procedure Date: 07/08/2016 MRN: [id number] Attending MD: Clarene Essex , MD Date of Birth: [birth year] CSN: [id number] Age: 51 Admit Type: Inpatient Procedure:                Colonoscopy Indications:              Iron deficiency anemia Providers:                Clarene Essex, MD, Vista Lawman, RN, Corliss Parish,                            Technician Referring MD:              Medicines:                Fentanyl 100 micrograms IV, Midazolam 9 mg IV,                            Diphenhydramine 25 mg IV Complications:            No immediate complications. Estimated Blood Loss:     Estimated blood loss: none. Procedure:                Pre-Anesthesia Assessment:                           - Prior to the procedure, a History and Physical                            was performed, and patient medications and                            allergies were reviewed. The patient's tolerance of                            previous anesthesia was also reviewed. The risks                            and benefits of the procedure and the sedation                            options and risks were discussed with the patient.                            All questions were answered, and informed consent                            was obtained. Prior Anticoagulants: The patient has                            taken Lovenox (enoxaparin), last dose was 1 day                            prior to procedure. ASA Grade Assessment: II - A  patient with mild systemic disease. After reviewing                            the risks and benefits, the patient was deemed in                            satisfactory condition to undergo the procedure.                           After obtaining informed consent, the colonoscope                            was passed under direct vision. Throughout the                            procedure, the patient's blood  pressure, pulse, and                            oxygen saturations were monitored continuously. The                            [id number] scope was introduced through                            the anus and advanced to the the terminal ileum.                            The terminal ileum, ileocecal valve, appendiceal                            orifice, and rectum were photographed. The                            colonoscopy was performed without difficulty. The                            patient tolerated the procedure well. The quality                            of the bowel preparation was adequate. Scope In: 8:36:18 AM Scope Out: 8:58:06 AM Scope Withdrawal Time: 0 hours 17 minutes 6 seconds  Total Procedure Duration: 0 hours 21 minutes 48 seconds  Findings:      A small polyp was found in the proximal transverse colon. The polyp was       semi-sessile. Biopsies were taken with a cold forceps for histology.      A small polyp was found in the proximal transverse colon. The polyp was       semi-sessile. The polyp was removed with a hot snare. Resection and       retrieval were complete.      External and internal hemorrhoids were found during retroflexion, during       perianal exam and during digital exam. The hemorrhoids were small.      The terminal ileum appeared normal.      The exam was  otherwise without abnormality. Impression:               - One small polyp in the proximal transverse colon.                            Biopsied.                           - One small polyp in the proximal transverse colon,                            removed with a hot snare. Resected and retrieved.                           - External and internal hemorrhoids.                           - The examined portion of the ileum was normal.                           - The examination was otherwise normal. Moderate Sedation:      Moderate (conscious) sedation was administered by the endoscopy  nurse       and supervised by the endoscopist. The following parameters were       monitored: oxygen saturation, heart rate, blood pressure, respiratory       rate, EKG, adequacy of pulmonary ventilation, and response to care. Recommendation:           - Soft diet today.                           - Continue present medications.                           - Await pathology results.                           - Repeat colonoscopy in 5 years for surveillance                            based on pathology results.                           - Return to GI office in 4 weeks.                           - Telephone GI clinic for pathology results in 1                            week.                           - Telephone GI clinic if symptomatic PRN.                           - Perform an upper GI endoscopy today. Procedure Code(s):        --- Professional ---  45385, Colonoscopy, flexible; with removal of                            tumor(s), polyp(s), or other lesion(s) by snare                            technique                           45380, 59, Colonoscopy, flexible; with biopsy,                            single or multiple Diagnosis Code(s):        --- Professional ---                           D12.3, Benign neoplasm of transverse colon (hepatic                            flexure or splenic flexure)                           K64.8, Other hemorrhoids                           D50.9, Iron deficiency anemia, unspecified CPT copyright 2016 American [employer] Association. All rights reserved. The codes documented in this report are preliminary and upon coder review may  be revised to meet current compliance requirements. Clarene Essex, MD 07/08/2016 9:11:51 AM This report has been signed electronically. Number of Addenda: 0

## 2016-07-08 NOTE — Op Note (Signed)
[facility] Patient Name: [name] Procedure Date: 07/08/2016 MRN: [id number] Attending MD: Clarene Essex , MD Date of Birth: [birth year] CSN: [id number] Age: 51 Admit Type: Inpatient Procedure:                Upper GI endoscopy Indications:              Iron deficiency anemia, For therapy of esophageal                            reflux Providers:                Clarene Essex, MD, Vista Lawman, RN, Corliss Parish,                            Technician Referring MD:              Medicines:                Midazolam 1 mg IV Complications:            No immediate complications. Estimated Blood Loss:     Estimated blood loss: none. Procedure:                Pre-Anesthesia Assessment:                           - Prior to the procedure, a History and Physical                            was performed, and patient medications and                            allergies were reviewed. The patient's tolerance of                            previous anesthesia was also reviewed. The risks                            and benefits of the procedure and the sedation                            options and risks were discussed with the patient.                            All questions were answered, and informed consent                            was obtained. Prior Anticoagulants: The patient has                            taken Lovenox (enoxaparin), last dose was 1 day                            prior to procedure. ASA Grade Assessment: II - A  patient with mild systemic disease. After reviewing                            the risks and benefits, the patient was deemed in                            satisfactory condition to undergo the procedure.                           After obtaining informed consent, the endoscope was                            passed under direct vision. Throughout the                            procedure, the patient's blood pressure,  pulse, and                            oxygen saturations were monitored continuously. The                            [id number]) scope was introduced through the                            mouth, and advanced to the third part of duodenum.                            The upper GI endoscopy was accomplished without                            difficulty. The patient tolerated the procedure                            well. Scope In: Scope Out: Findings:      The larynx was normal.      A small -medium-sized hiatal hernia was present.      The entire examined stomach was normal.      The ampulla, duodenal bulb, first portion of the duodenum, second       portion of the duodenum, area of the papilla and third portion of the       duodenum were normal.      The exam was otherwise without abnormality. Impression:               - Normal larynx.                           - Small -Medium-sized hiatal hernia.                           - Normal stomach.                           - Normal ampulla, duodenal bulb, first portion of  the duodenum, second portion of the duodenum, area                            of the papilla and third portion of the duodenum.                           - The examination was otherwise normal.                           - No specimens collected. Moderate Sedation:      Moderate (conscious) sedation was administered by the endoscopy nurse       and supervised by the endoscopist. The following parameters were       monitored: oxygen saturation, heart rate, blood pressure, respiratory       rate, EKG, adequacy of pulmonary ventilation, and response to care. Recommendation:           - Patient has a contact number available for                            emergencies. The signs and symptoms of potential                            delayed complications were discussed with the                            patient. Return to normal activities  tomorrow.                            Written discharge instructions were provided to the                            patient.                           - Soft diet today.                           - Continue present medications.                           - Await pathology results.                           - Return to GI clinic in 4 weeks.                           - Telephone GI clinic if symptomatic PRN.                           - Telephone GI clinic for pathology results in 1                            week. Procedure Code(s):        --- Professional ---  [id number], Esophagogastroduodenoscopy, flexible,                            transoral; diagnostic, including collection of                            specimen(s) by brushing or washing, when performed                            (separate procedure) Diagnosis Code(s):        --- Professional ---                           K44.9, Diaphragmatic hernia without obstruction or                            gangrene                           D50.9, Iron deficiency anemia, unspecified                           K21.9, Gastro-esophageal reflux disease without                            esophagitis CPT copyright 2016 American Medical Association. All rights reserved. The codes documented in this report are preliminary and upon coder review may  be revised to meet current compliance requirements. Clarene Essex, MD 07/08/2016 9:15:21 AM This report has been signed electronically. Number of Addenda: 0

## 2016-07-08 NOTE — Progress Notes (Signed)
see procedure notes for details-no signs of active bleeding or at risk lesions no further GI workup at this time although consider a one time CT just to be sure and okay to put him on iron and proceed with vascular surgery and follow-up with me in the office in 4-6 weeks to recheck symptoms guaiac CBC and make sure no further workup and plans need to be done at that point and continue pump inhibitors for hiatal hernia and minimize blood thinners as much as possible and call me if I can be of any further help this weekend and okay to slowly advance diet from soft to regular when doing well

## 2016-07-08 NOTE — Progress Notes (Signed)
[name] is a 51 y.o. male is here to [place].   History of Present Illness:    1. PAD. Upcoming vascular surgery, fem-fem bypass, for right common iliac artery occlusion, failed stent. Scheduled for 07/11/16. Preoperative evaluation and management by Cardiology (06/02/16) and Hematology (earlier today). Previously on Plavix.    2. Smoker. Working on quitting. Rx Chantix, started on 06/02/16 by Cardiology. Patient is optimistic.    3. Need for immunization against influenza. Willing to get the FLUVAX today after discussion.   4. Overweight (BMI 25.0-29.9). Does not exercise or watch diet. No ETOH.      Health Maintenance Due  Topic Date Due  . HIV Screening  07/08/1982  . TETANUS/TDAP  07/08/1986    PMHx, SurgHx, SocialHx, Medications, and Allergies were reviewed in the Visit Navigator and updated as appropriate.    Past Medical History:  Diagnosis Date  . Anxiety   . Blood dyscrasia   . Dyspnea   . GERD (gastroesophageal reflux disease)   . Hyperlipidemia     Past Surgical History:  Procedure Laterality Date  . KNEE SURGERY    . NECK SURGERY    . PERIPHERAL VASCULAR CATHETERIZATION N/A 02/24/2016   Procedure: Abdominal Aortogram w/Lower Extremity;  Surgeon: Conrad [place], MD;  Location: [place] [facility];  Service: Cardiovascular;  Laterality: N/A;  . PERIPHERAL VASCULAR CATHETERIZATION Right 02/24/2016   Procedure: Peripheral Vascular Intervention;  Surgeon: Conrad [place], MD;  Location: [place] [facility];  Service: Cardiovascular;  Laterality: Right;  Common iliac  . PERIPHERAL VASCULAR CATHETERIZATION N/A 05/11/2016   Procedure: Abdominal Aortogram;  Surgeon: Conrad [place], MD;  Location: [place] [facility];  Service: Cardiovascular;  Laterality: N/A;  . PERIPHERAL VASCULAR CATHETERIZATION N/A 05/11/2016   Procedure: Lower Extremity Angiography;  Surgeon: Conrad [place], MD;  Location: [place] [facility];  Service: Cardiovascular;  Laterality: N/A;     Family History  Problem Relation Age of Onset  . Cancer Father     Lung    Social History  Substance Use Topics  . Smoking status: Current Every Day Smoker    Packs/day: 0.50    Types: Cigarettes, E-cigarettes  . Smokeless tobacco: Never Used     Comment: 1/2 pk per day.  Vaporizor has 0% nicotine.  Trying to quit  . Alcohol use No     Comment: Hx etoh    Current Medications and Allergies:    Current Outpatient Prescriptions:  .  enoxaparin (LOVENOX) 80 MG/0.8ML injection, Inject 0.8 mLs (80 mg total) into the skin every 12 (twelve) hours., Disp: 30 Syringe, Rfl: 0 .  esomeprazole (NEXIUM) 20 MG capsule, Take 20 mg by mouth daily. , Disp: , Rfl:  .  gabapentin (NEURONTIN) 100 MG capsule, Take 2 capsules (200 mg total) by mouth 3 (three) times daily. (Patient taking differently: Take 200 mg by mouth at bedtime. ), Disp: 180 capsule, Rfl: 3 .  ranitidine (ZANTAC) 150 MG tablet, Take 150 mg by mouth daily as needed for heartburn., Disp: , Rfl:  .  rosuvastatin (CRESTOR) 5 MG tablet, Take 1 tablet (5 mg total) by mouth daily., Disp: 30 tablet, Rfl: 5 .  varenicline (CHANTIX STARTING MONTH PAK) 0.5 MG X 11 & 1 MG X 42 tablet, Take as directed per package instructions. (Patient taking differently: Take 1 mg by mouth daily. Take as directed per package instructions.), Disp: 53 tablet, Rfl: 0 .  docusate sodium (COLACE) 100 MG capsule, Take 1 capsule (  100 mg total) by mouth 2 (two) times daily., Disp: 10 capsule, Rfl: 0 .  ferrous sulfate 325 (65 FE) MG tablet, Take 1 tablet (325 mg total) by mouth daily with breakfast., Disp: 30 tablet, Rfl: 0 .  mupirocin ointment (BACTROBAN) 2 %, Apply 1 application topically daily. , Disp: , Rfl:    No Known Allergies    Patient Information Form: Screening and ROS    Do you feel safe in relationships? yes PHQ-2: negative  Review of Systems  General:  Negative for unexplained weight loss, fever Skin: Negative for new or changing mole,  sore that won't heal HEENT: Negative for trouble hearing, trouble seeing, ringing in ears, mouth sores, hoarseness, change in voice, dysphagia CV:  Negative for chest pain, dyspnea, edema, palpitations Resp: Negative for cough, dyspnea, hemoptysis GI: Negative for nausea, vomiting, diarrhea, constipation, abdominal pain, melena, hematochezia GU: Negative for dysuria, incontinence, urinary hesitance, hematuria Neuro: Negative for headaches, weakness, numbness, dizziness, passing out/fainting Psych: Negative for depression, anxiety, memory problems   Vitals:   Vitals:   07/05/16 1534  BP: 132/78  Pulse: 81  Temp: 98.1 F (36.7 C)  TempSrc: Oral  SpO2: 96%  Weight: 189 lb 9.6 oz (86 kg)  Height: 5\' 10"  (1.778 m)     Body mass index is 27.2 kg/m.   Physical Exam:    General: Alert, cooperative, appears stated age and no distress.  HEENT:  Normocephalic, without obvious abnormality, atraumatic. Conjunctivae/corneas clear. PERRL, EOM's intact. Normal TM's and external ear canals both ears. Nares normal. Septum midline. Mucosa normal. No drainage or sinus tenderness. Lips, mucosa, and tongue normal; teeth and gums normal.  Lungs: Clear to auscultation bilaterally.  Heart:: Regular rate and rhythm, S1, S2 normal, no murmur, click, rub or gallop.  Abdomen: Soft, non-tender; bowel sounds normal; no masses,  no organomegaly.  Extremities: Extremities normal, atraumatic, no cyanosis or edema.  Skin: Skin color, texture, turgor normal. No rashes or lesions.  Neurologic: Alert and oriented X 3, normal strength and tone. Normal symmetric. reflexes. Normal coordination and gait.  Psych: Alert,oriented, in NAD with a full range of affect, normal behavior and no psychotic features     Assessment and Plan:    Milind was seen today for establish care.  Diagnoses and all orders for this visit:  Atherosclerosis of native artery of right lower extremity with intermittent claudication  ([place]) Comments: Will be available after anticoagulation management after surgery.   Smoker Comments: Continue Chantix.   Need for immunization against influenza -     Flu Vaccine QUAD 36+ mos PF IM (Fluarix & Fluzone Quad PF)  Overweight (BMI 25.0-29.9) Comments: Reviewed healthy food choices in light of future surgery and smoking cessation.   . Reviewed expectations re: course of current medical issues. . Discussed self-management of symptoms. . Outlined signs and symptoms indicating need for more acute intervention. . Patient verbalized understanding and all questions were answered. . See orders for this visit as documented in the electronic medical record. . Patient received an After Visit Summary.  Records requested if needed. I spent 30 minutes with this patient, greater than 50% was face-to-face time counseling regarding the above diagnoses.   Briscoe Deutscher, [place], [place] 07/08/2016   Follow-up: No Follow-up on file.  No orders of the defined types were placed in this encounter.  Medications Discontinued During This Encounter  Medication Reason  . clopidogrel (PLAVIX) 75 MG tablet   . varenicline (CHANTIX CONTINUING MONTH PAK) 1 MG  tablet    Orders Placed This Encounter  Procedures  . Flu Vaccine QUAD 36+ mos PF IM (Fluarix & Fluzone Quad PF)

## 2016-07-08 NOTE — Discharge Summary (Signed)
Physician Discharge Summary  [name] [id number] DOB: 1966-04-06 DOA: 07/06/2016  PCP: PROVIDER NOT IN SYSTEM  Admit date: 07/06/2016 Discharge date: 07/08/2016  Admitted From: Home Disposition:  Home  Recommendations for Outpatient Follow-up:  1. Follow up with PCP in 2-3 weeks 2. Follow up with Vascular Surgery as scheduled on 2/20 3. Follow up with GI in 4-6 weeks 4. 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 Patient had CBC drawn yesterday as part of preoperative evaluation prior to lower extremity arterial bypass, and was noted to have hemoglobin of 7.6, down from 10.2 in December. 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 This was treated with stent, but the stent became thrombosed by December 2017 despite being on Plavix and workup at that time was suggestive of antiphospholipid antibody syndrome. Aspirin was added to his Plavix, he was referred to hematology,and started on Lovenox on 07/03/2016. Patient denies any melena or hematochezia and denies any significant abdominal pain. He does endorse recent increase in his chronic GERD symptoms. He takes Nexium daily and when necessary Zantac at home. He denies recent use of NSAIDs. There's been no chest pain or palpitations and no lightheadedness or presyncope  1. 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 Patient underwent EGD on 2/17 with no evidence of bleeding - Discussed case with Dr. Watt Climes who has cleared patient to resume anticoagulation/antiplatelet as needed - Patient is s/p  2 units pRBCs this admission - Continued on PPI  - GI to follow up in 4-6 weeks - Will prescribe iron supplimentation  2. Antiphospholipid antibody syndrome  - Pt is under the care of Dr. Irene Limbo of hematology and recently started on Lovenox  - Lovenox was held on admission - Complicated situation - Per Dr. Irene Limbo, pt will need antiplatelet and anticoagulation  3. PAD - S/p common iliac stent in October 2017, stent was thrombosed by December 2017 despite taking Plavix; ASA was added and he was tentatively scheduled for bypass on 07/11/16  - Stable at present   4. Leukocytosis - Mild leukocytosis to 10.6 noted on admission without fever or apparent focus of infection - Likely reactive -Clinically stable  5. GERD - No EGD report on file - Worse lately per patient; managed at home with daily Nexium and prn Zantac  - As above, he is continued on PPI - GI consulted. Underwent unremarkable EGD on 2/17   Discharge Diagnoses:  Principal Problem:   Symptomatic anemia Active Problems:   PAD (peripheral artery disease) (HCC)   Antiphospholipid antibody positive   Gastroesophageal  reflux disease   Acute blood loss anemia   Iron deficiency anemia due to chronic blood loss    Discharge Instructions  Discharge Instructions    AMB Referral to [facility]    Complete by:  As directed     Please assign UMR member for post discharge call. Currently at [facility]. Thanks. Marthenia Rolling, [place], [facility] Liaison-[phone]   Reason for consult:  Please assign UMR member for post discharge call   Expected date of contact:  1-3 days (reserved for hospital discharges)     Allergies as of 07/08/2016   No Known Allergies     Medication List    TAKE these medications   docusate sodium 100 MG capsule Commonly known as:  COLACE Take 1 capsule (100 mg total) by mouth 2 (two) times daily.   enoxaparin 80 MG/0.8ML injection Commonly known as:  LOVENOX Inject 0.8 mLs (80 mg total) into the skin every 12 (twelve) hours.   esomeprazole 20 MG capsule Commonly known as:  NEXIUM Take 20 mg by mouth daily.   ferrous sulfate 325 (65 FE) MG tablet Take 1 tablet (325 mg total) by mouth daily with breakfast.   gabapentin 100 MG capsule Commonly known as:  NEURONTIN Take 2 capsules (200 mg total) by mouth 3 (three) times daily. What changed:  when to take this   mupirocin ointment 2 % Commonly known as:  BACTROBAN Apply 1 application topically daily.   ranitidine 150 MG tablet Commonly known as:  ZANTAC Take 150 mg by mouth daily as needed for heartburn.   rosuvastatin 5 MG tablet Commonly known as:  CRESTOR Take 1 tablet (5 mg total) by mouth daily.   varenicline 0.5 MG X 11 & 1 MG X 42 tablet Commonly known as:  CHANTIX STARTING MONTH PAK Take as directed per package instructions. What changed:  how much to take  how to take this  when to take this  additional instructions   varenicline 1 MG tablet Commonly known as:  CHANTIX CONTINUING MONTH PAK Take 1 tablet (1 mg total) by mouth 2 (two) times daily. What changed:  Another medication with the same name was changed. Make sure you understand how and when to take each.      Follow-up Information    Follow up with PCP in 2-3 weeks. Schedule an appointment as soon as possible for a  visit.        Follow up with Vascular Surgery as scheduled Follow up on 07/11/2016.        MAGOD,MARC E, MD Follow up in 4 week(s).   Specialty:  Gastroenterology Contact information: [id number] [place] [address] [phone]          No Known Allergies  Consultations:  GI  Procedures/Studies: No results found.  Subjective: No complaints  Discharge Exam: Vitals:   07/08/16 0920 07/08/16 0925  BP: 124/74 130/67  Pulse: 66 74  Resp: (!) 21 (!) 24  Temp:     Vitals:   07/08/16 0910 07/08/16 0915 07/08/16 0920 07/08/16 0925  BP: 123/76 122/70 124/74 130/67  Pulse: 73 71 66 74  Resp: 19 20 (!) 21 (!) 24  Temp:      TempSrc:      SpO2: 95% 94% 95% 95%  Weight:      Height:        General: Pt is alert, awake, not in acute distress Cardiovascular: RRR, S1/S2 +, no rubs, no gallops  Respiratory: CTA bilaterally, no wheezing, no rhonchi Abdominal: Soft, NT, ND, bowel sounds + Extremities: no edema, no cyanosis   The results of significant diagnostics from this hospitalization (including imaging, microbiology, ancillary and laboratory) are listed below for reference.     Microbiology: Recent Results (from the past 240 hour(s))  Surgical pcr screen     Status: Abnormal   Collection Time: 07/05/16  8:49 AM  Result Value Ref Range Status   MRSA, PCR NEGATIVE NEGATIVE Final   Staphylococcus aureus POSITIVE (A) NEGATIVE Final    Comment:        The Xpert SA Assay (FDA approved for NASAL specimens in patients over 9 years of age), is one component of a comprehensive surveillance program.  Test performance has been validated by [facility] for patients greater than or equal to 42 year old. It is not intended to diagnose infection nor to guide or monitor treatment.      Labs: BNP (last 3 results) No results for input(s): BNP in the last 8760 hours. Basic Metabolic Panel:  Recent Labs Lab 07/03/16 1401 07/05/16 0850  07/06/16 2104 07/07/16 1020  NA 138 139 139 140  K 4.0 3.9 3.9 4.1  CL  --  107 106 109  CO2 25 24 26 24   GLUCOSE 87 91 88 95  BUN 7.7 5* 7 6  CREATININE 0.9 0.90 0.94 1.01  CALCIUM 9.7 9.0 9.0 8.9   Liver Function Tests:  Recent Labs Lab 07/03/16 1401 07/05/16 0850 07/06/16 2104  AST 17 19 21   ALT 13 12* 14*  ALKPHOS 85 69 60  BILITOT 1.03 0.7 0.8  PROT 7.8 6.8 7.0  ALBUMIN 4.1 3.9 3.9   No results for input(s): LIPASE, AMYLASE in the last 168 hours. No results for input(s): AMMONIA in the last 168 hours. CBC:  Recent Labs Lab 07/03/16 1400 07/05/16 0850 07/06/16 2104 07/07/16 1020 07/08/16 0524  WBC 11.6* 11.1* 10.6*  --  9.5  NEUTROABS 8.1*  --   --   --   --   HGB 7.5* 7.6* 6.5* 9.3* 9.7*  HCT 26.5* 28.8* 24.3* 32.5* 34.0*  MCV 61.4* 66.5* 65.7*  --  69.0*  PLT 362 308 303  --  259   Cardiac Enzymes: No results for input(s): CKTOTAL, CKMB, CKMBINDEX, TROPONINI in the last 168 hours. BNP: Invalid input(s): POCBNP CBG:  Recent Labs Lab 07/07/16 0726 07/08/16 0729  GLUCAP 111* 105*   D-Dimer No results for input(s): DDIMER in the last 72 hours. Hgb A1c No results for input(s): HGBA1C in the last 72 hours. Lipid Profile No results for input(s): CHOL, HDL, LDLCALC, TRIG, CHOLHDL, LDLDIRECT in the last 72 hours. Thyroid function studies No results for input(s): TSH, T4TOTAL, T3FREE, THYROIDAB in the last 72 hours.  Invalid input(s): FREET3 Anemia work up  Recent Labs  07/07/16 1020  VITAMINB12 503  FOLATE 15.9  FERRITIN 3*  TIBC 498*  IRON 14*  RETICCTPCT 1.0   Urinalysis    Component Value Date/Time   COLORURINE YELLOW 07/05/2016 [place] 07/05/2016 0850   LABSPEC 1.017 07/05/2016 0850   PHURINE 5.0 07/05/2016 0850   GLUCOSEU NEGATIVE 07/05/2016 0850   HGBUR NEGATIVE 07/05/2016 0850   BILIRUBINUR NEGATIVE 07/05/2016 0850   KETONESUR NEGATIVE 07/05/2016 0850   PROTEINUR NEGATIVE 07/05/2016 0850   UROBILINOGEN 1.0  07/15/2010 1607   NITRITE NEGATIVE 07/05/2016 0850   LEUKOCYTESUR NEGATIVE 07/05/2016 0850   Sepsis Labs Invalid input(s): PROCALCITONIN,  WBC,  LACTICIDVEN Microbiology  Recent Results (from the past 240 hour(s))  Surgical pcr screen     Status: Abnormal   Collection Time: 07/05/16  8:49 AM  Result Value Ref Range Status   MRSA, PCR NEGATIVE NEGATIVE Final   Staphylococcus aureus POSITIVE (A) NEGATIVE Final    Comment:        The Xpert SA Assay (FDA approved for NASAL specimens in patients over 69 years of age), is one component of a comprehensive surveillance program.  Test performance has been validated by [facility] for patients greater than or equal to 22 year old. It is not intended to diagnose infection nor to guide or monitor treatment.      SIGNED:   Donne Hazel, MD  [facility] 07/08/2016, 5:26 PM  If 7PM-7AM, please contact night-coverage [email or web address] Password [email or web address]

## 2016-07-08 NOTE — Progress Notes (Signed)
[name] 8:27 AM  Subjective: Patient seen and examined and case discussed with my partner Dr. Jacinto Reap and has had no signs of bleeding and has never had a GI workup but does have some upper tract symptoms on Nexium and has no new complaints  Objective: Vital signs stable afebrile no acute distress exam please see preassessment evaluation hemoglobin stable guaiac-negative  Assessment: Iron deficiency in patient on blood thinners  Plan: Okay to proceed with colonoscopy and if nondiagnostic endoscopy today  [facility] E  Pager [phone] After 5PM or if no answer call [phone]

## 2016-07-10 ENCOUNTER — Other Ambulatory Visit: Payer: Self-pay | Admitting: *Deleted

## 2016-07-10 ENCOUNTER — Other Ambulatory Visit: Payer: Self-pay | Admitting: Hematology

## 2016-07-10 ENCOUNTER — Encounter (HOSPITAL_COMMUNITY): Payer: Self-pay | Admitting: Gastroenterology

## 2016-07-10 ENCOUNTER — Telehealth: Payer: Self-pay | Admitting: Hematology

## 2016-07-10 DIAGNOSIS — D649 Anemia, unspecified: Secondary | ICD-10-CM

## 2016-07-10 NOTE — Telephone Encounter (Signed)
lvm to inform pt of 2/23 appt at 1030 am per LOS

## 2016-07-10 NOTE — Patient Outreach (Signed)
[name] [facility]) Care Management  07/10/2016  Braedan Varin 12-21-1965 [id number]   Subjective: Telephone call to patient's mobile number, spoke with patient, and HIPAA verified.  Discussed [facility] Care Management Transition of care follow up, patient voiced understanding, and is in agreement to complete follow up.  Patient states he is doing well, has been cleared surgery, and will go back to the hospital for planned schedule on 07/11/16.  States he will be in the hospital for 3 -4 days.  States will make follow up appointment with primary MD and surgeon after surgery discharge.  Patient states he does not have any transition of care, care coordination, disease management, disease monitoring, transportation, community resource, or pharmacy needs at this time.  He is very appreciative of follow up call, is in agreement to post discharge call after 07/11/16 surgery, and to receive [facility] Management information.   Objective: Per chart review, patient hospitalized  07/06/16 -07/08/16 for acute anemia.  Patient also has a history of: Antiphospholipid antibody syndrome, peripheral arterial disease (PAD), and GERD.    Assessment:  Received UMR Transition of care referral on 07/07/16.  Transition of care follow up completed, no care management needs at this time, and will follow up after surgery to assess additional transition of care needs.   Plan: RNCM will call patient for transition of care follow up within 3 business days of surgery hospital discharge.   Leisa Gault H. Annia Friendly, BSN, [facility] Telephonic CM Phone: [phone] Fax: [phone]

## 2016-07-11 ENCOUNTER — Inpatient Hospital Stay (HOSPITAL_COMMUNITY): Admission: RE | Admit: 2016-07-11 | Payer: 59 | Source: Ambulatory Visit | Admitting: Vascular Surgery

## 2016-07-11 ENCOUNTER — Encounter: Payer: Self-pay | Admitting: Family Medicine

## 2016-07-11 ENCOUNTER — Ambulatory Visit (INDEPENDENT_AMBULATORY_CARE_PROVIDER_SITE_OTHER): Payer: 59 | Admitting: Family Medicine

## 2016-07-11 ENCOUNTER — Other Ambulatory Visit: Payer: Self-pay

## 2016-07-11 ENCOUNTER — Encounter (HOSPITAL_COMMUNITY): Admission: RE | Payer: Self-pay | Source: Ambulatory Visit

## 2016-07-11 VITALS — BP 118/76 | HR 74 | Temp 97.9°F | Ht 70.0 in | Wt 186.8 lb

## 2016-07-11 DIAGNOSIS — R760 Raised antibody titer: Secondary | ICD-10-CM

## 2016-07-11 DIAGNOSIS — Z87898 Personal history of other specified conditions: Secondary | ICD-10-CM | POA: Diagnosis not present

## 2016-07-11 DIAGNOSIS — I70211 Atherosclerosis of native arteries of extremities with intermittent claudication, right leg: Secondary | ICD-10-CM | POA: Diagnosis not present

## 2016-07-11 DIAGNOSIS — M542 Cervicalgia: Secondary | ICD-10-CM

## 2016-07-11 DIAGNOSIS — F17200 Nicotine dependence, unspecified, uncomplicated: Secondary | ICD-10-CM | POA: Diagnosis not present

## 2016-07-11 DIAGNOSIS — G8929 Other chronic pain: Secondary | ICD-10-CM

## 2016-07-11 DIAGNOSIS — D509 Iron deficiency anemia, unspecified: Secondary | ICD-10-CM

## 2016-07-11 DIAGNOSIS — K219 Gastro-esophageal reflux disease without esophagitis: Secondary | ICD-10-CM

## 2016-07-11 DIAGNOSIS — F1011 Alcohol abuse, in remission: Secondary | ICD-10-CM

## 2016-07-11 LAB — CBC WITH DIFFERENTIAL/PLATELET
Basophils Absolute: 0.1 10*3/uL (ref 0.0–0.1)
Basophils Relative: 1.1 % (ref 0.0–3.0)
Eosinophils Absolute: 0.8 10*3/uL — ABNORMAL HIGH (ref 0.0–0.7)
Eosinophils Relative: 6.4 % — ABNORMAL HIGH (ref 0.0–5.0)
HCT: 35.9 % — ABNORMAL LOW (ref 39.0–52.0)
Hemoglobin: 10.6 g/dL — ABNORMAL LOW (ref 13.0–17.0)
Lymphocytes Relative: 19.7 % (ref 12.0–46.0)
Lymphs Abs: 2.4 10*3/uL (ref 0.7–4.0)
MCHC: 29.6 g/dL — ABNORMAL LOW (ref 30.0–36.0)
MCV: 68.8 fl — ABNORMAL LOW (ref 78.0–100.0)
Monocytes Absolute: 0.8 10*3/uL (ref 0.1–1.0)
Monocytes Relative: 6.6 % (ref 3.0–12.0)
Neutro Abs: 8.2 10*3/uL — ABNORMAL HIGH (ref 1.4–7.7)
Neutrophils Relative %: 66.2 % (ref 43.0–77.0)
Platelets: 314 10*3/uL (ref 150.0–400.0)
RBC: 5.22 Mil/uL (ref 4.22–5.81)
RDW: 25 % — ABNORMAL HIGH (ref 11.5–15.5)
WBC: 12.4 10*3/uL — ABNORMAL HIGH (ref 4.0–10.5)

## 2016-07-11 LAB — COMPREHENSIVE METABOLIC PANEL
ALT: 44 U/L (ref 0–53)
AST: 36 U/L (ref 0–37)
Albumin: 4.5 g/dL (ref 3.5–5.2)
Alkaline Phosphatase: 72 U/L (ref 39–117)
BUN: 7 mg/dL (ref 6–23)
CO2: 28 mEq/L (ref 19–32)
Calcium: 10.2 mg/dL (ref 8.4–10.5)
Chloride: 105 mEq/L (ref 96–112)
Creatinine, Ser: 0.85 mg/dL (ref 0.40–1.50)
GFR: 101.82 mL/min (ref >60.00)
Glucose, Bld: 85 mg/dL (ref 70–99)
Potassium: 4.4 mEq/L (ref 3.5–5.1)
Sodium: 140 mEq/L (ref 135–145)
Total Bilirubin: 0.6 mg/dL (ref 0.2–1.2)
Total Protein: 7.5 g/dL (ref 6.0–8.3)

## 2016-07-11 SURGERY — CREATION, BYPASS, ARTERIAL, FEMORAL TO FEMORAL, USING GRAFT
Anesthesia: General | Laterality: Left

## 2016-07-11 MED ORDER — DULOXETINE HCL 30 MG PO CPEP: ORAL_CAPSULE | ORAL | 3 refills | Status: DC

## 2016-07-11 MED ORDER — SUCRALFATE 1 G PO TABS: 1.0000 g | ORAL_TABLET | Freq: Three times a day (TID) | ORAL | 0 refills | Status: DC

## 2016-07-11 MED ORDER — OXYCODONE-ACETAMINOPHEN 5-325 MG PO TABS: 1.0000 | ORAL_TABLET | ORAL | 0 refills | Status: DC | PRN

## 2016-07-11 MED FILL — SUCRALFATE 1 GM TABLET: 1 | 30 days supply | Qty: 120 | Fill #0

## 2016-07-11 MED FILL — DULoxetine HCL 30 MG CPEP: 30 | 30 days supply | Qty: 60 | Fill #0

## 2016-07-11 NOTE — Progress Notes (Signed)
[name] is a 51 y.o. male here for a HOSPITAL FOLLOW-UP.  History of Present Illness:   Previously scheduled for arterial bypass surgery on 07/11/16 for right common iliac artery occlusion after failed stent while on ASA and Plavix. 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   Preoperative evaluation and management by Cardiology (06/02/16) and Hematology (07/05/16). Labs done 07/05/16 revealed anemia with Hgb of 7.6, down from 10.2 in December. Patient was contacted by his hematologist and directed to the ER. Hgb 6.5 on admission with MCV 65.7. In the hospital, all anticoagulation was held while GI was consulted and 2 units of pRBCs were transfused. EGD/colonoscopy without evidence of bleeding (Dr. Watt Climes). Patient was cleared to resume anticoagulation/antiplatelet prn. He was advised to continue his PPI, start iron supplementation, and f/u with GI in 4-6 weeks.   The patient was discharged on Lovenox again and instructed to follow up on 07/11/16 for vascular surgery as previously scheduled. He presented today and was surprised to learn that the surgery was canceled. He and his wife had several concerns, so requested a visit with me to help coordinate care.   Today, the patient states that he is worried about the anemia because he has not been told why he was so anemic and he did not feel that he had symptoms. When asked directly, though, he endorses decreased SOB and fatigue now. Cardiovascular ROS: negative for - chest pain, edema, irregular heartbeat or palpitations.  He continues to work on smoking cessation, but admits that he increased the number of cigarettes/day after leaving the hospital. He feels committed to following his plan now. He is still taking Chantix.  The patient has a history of neck pain. He sometimes take Motrin 800 tid. He now understands that he may not take NSAIDs. Neurontin  helps him to rest. He asks for help with pain control.     CBC Latest Ref Rng & Units 07/08/2016 07/07/2016 07/06/2016  WBC 4.0 - 10.5 K/uL 9.5 - 10.6(H)  Hemoglobin 13.0 - 17.0 g/dL 9.7(L) 9.3(L) 6.5(LL)  Hematocrit 39.0 - 52.0 % 34.0(L) 32.5(L) 24.3(L)  Platelets 150 - 400 K/uL 259 - 303    Current Medications:   Current Outpatient Prescriptions:  .  docusate sodium (COLACE) 100 MG capsule, Take 1 capsule (100 mg total) by mouth 2 (two) times daily., Disp: 10 capsule, Rfl: 0 .  enoxaparin (LOVENOX) 80 MG/0.8ML injection, Inject 0.8 mLs (80 mg total) into the skin every 12 (twelve) hours., Disp: 30 Syringe, Rfl: 0 .  esomeprazole (NEXIUM) 20 MG capsule, Take 20 mg by mouth daily. , Disp: , Rfl:  .  ferrous sulfate 325 (65 FE) MG tablet, Take 1 tablet (325 mg total) by mouth daily with breakfast., Disp: 30 tablet, Rfl: 0 .  gabapentin (NEURONTIN) 100 MG capsule, Take 2 capsules (200 mg total) by mouth 3 (three) times daily. (Patient taking differently: Take 200 mg by mouth at bedtime. ), Disp: 180 capsule, Rfl: 3 .  mupirocin ointment (BACTROBAN) 2 %, Apply 1 application topically daily. , Disp: , Rfl:  .  ranitidine (ZANTAC) 150 MG tablet, Take 150 mg by mouth daily as needed for heartburn., Disp: , Rfl:  .  rosuvastatin (CRESTOR) 5 MG tablet, Take 1 tablet (5 mg total) by mouth daily., Disp: 30 tablet, Rfl: 5 .  varenicline (CHANTIX STARTING MONTH PAK) 0.5 MG X 11 & 1 MG X 42 tablet, Take as directed per package  instructions. (Patient taking differently: Take 1 mg by mouth daily. Take as directed per package instructions.), Disp: 53 tablet, Rfl: 0    Review of Systems:   Review of Systems  Constitutional: Negative for chills, fever and malaise/fatigue.  Eyes: Negative for blurred vision.  Respiratory: Negative for cough, shortness of breath and wheezing.   Cardiovascular: Negative for chest pain, palpitations and leg swelling.  Gastrointestinal: Positive for heartburn. Negative for  abdominal pain, blood in stool, constipation, diarrhea, melena, nausea and vomiting.  Genitourinary: Negative for dysuria, frequency and hematuria.  Musculoskeletal: Positive for neck pain.  Skin: Negative for itching and rash.  Neurological: Negative for dizziness, focal weakness, weakness and headaches.  Psychiatric/Behavioral: Positive for depression. Negative for hallucinations, substance abuse and suicidal ideas.    Vitals:   Today's Vitals   07/11/16 1219  BP: 118/76  Pulse: 74  Temp: 97.9 F (36.6 C)  TempSrc: Oral  SpO2: 94%  Weight: 186 lb 12.8 oz (84.7 kg)  Height: 5\' 10"  (1.778 m)    Physical Exam:   General: Alert, cooperative, appears stated age and no distress.  HEENT:  Normocephalic, without obvious abnormality, atraumatic. Conjunctivae/corneas clear. PERRL, EOM's intact.   Lungs: Clear to auscultation bilaterally.  Heart:: Regular rate and rhythm, S1, S2 normal, no murmur, click, rub or gallop.  Abdomen: Soft, non-tender; bowel sounds normal; no masses,  no organomegaly.  Extremities: Extremities normal, atraumatic, no cyanosis or edema.  Skin: Skin color, texture, turgor normal. No rashes or lesions.  Neurologic: Alert and oriented X 3, normal strength and tone. Normal symmetric. reflexes. Normal coordination and gait.  Psych: Alert,oriented, in NAD with a full range of affect, normal behavior and no psychotic features    Results for orders placed or performed in visit on 07/11/16  Comprehensive metabolic panel  Result Value Ref Range   Sodium 140 135 - 145 mEq/L   Potassium 4.4 3.5 - 5.1 mEq/L   Chloride 105 96 - 112 mEq/L   CO2 28 19 - 32 mEq/L   Glucose, Bld 85 70 - 99 mg/dL   BUN 7 6 - 23 mg/dL   Creatinine, Ser 0.85 0.40 - 1.50 mg/dL   Total Bilirubin 0.6 0.2 - 1.2 mg/dL   Alkaline Phosphatase 72 39 - 117 U/L   AST 36 0 - 37 U/L   ALT 44 0 - 53 U/L   Total Protein 7.5 6.0 - 8.3 g/dL   Albumin 4.5 3.5 - 5.2 g/dL   Calcium 10.2 8.4 - 10.5 mg/dL    GFR 101.82 >60.00 mL/min  CBC with Differential/Platelet  Result Value Ref Range   WBC 12.4 (H) 4.0 - 10.5 K/uL   RBC 5.22 4.22 - 5.81 Mil/uL   Hemoglobin 10.6 (L) 13.0 - 17.0 g/dL   HCT 35.9 (L) 39.0 - 52.0 %   MCV 68.8 Repeated and verified X2. (L) 78.0 - 100.0 fl   MCHC 29.6 (L) 30.0 - 36.0 g/dL   RDW 25.0 (H) 11.5 - 15.5 %   Platelets 314.0 150.0 - 400.0 K/uL   Neutrophils Relative % 66.2 43.0 - 77.0 %   Lymphocytes Relative 19.7 12.0 - 46.0 %   Monocytes Relative 6.6 3.0 - 12.0 %   Eosinophils Relative 6.4 (H) 0.0 - 5.0 %   Basophils Relative 1.1 0.0 - 3.0 %   Neutro Abs 8.2 (H) 1.4 - 7.7 K/uL   Lymphs Abs 2.4 0.7 - 4.0 K/uL   Monocytes Absolute 0.8 0.1 - 1.0 K/uL   Eosinophils Absolute  0.8 (H) 0.0 - 0.7 K/uL   Basophils Absolute 0.1 0.0 - 0.1 K/uL   Microcytosis Presence of (A) None     Assessment and Plan:   [place] was seen today for follow-up.  Diagnoses and all orders for this visit:  Iron deficiency anemia, unspecified iron deficiency anemia type Comments: The patient has an upcoming appointment with Dr. Irene Limbo, Hematology. He has also been scheduled for 2 iron transfusions.  Orders: -     Comprehensive metabolic panel -     CBC with Differential/Platelet  Chronic neck pain Comments: Flares can inhibit function. Usually takes Motrin for pain - sometimes up to 800 mg po tid. Unable to take ANY NSAIDs now. Okay trial of SNRI below. Okay prn Oxycodone to be used sparingly at this time. Will monitor and readdress frequently over the next few months.  Orders: -     oxyCODONE-acetaminophen (PERCOCET/ROXICET) 5-325 MG tablet; Take 1 tablet by mouth every 4 (four) hours as needed for severe pain. -     DULoxetine (CYMBALTA) 30 MG capsule; Take one 30 mg tablet by mouth once a day for the first week. Then increase to two 30 mg tablets ( total 60mg ) by mouth once daily.  Gastroesophageal reflux disease without esophagitis Comments: Small to medium sized hiatal hernia  found on EGD.  Orders: -     sucralfate (CARAFATE) 1 g tablet; Take 1 tablet (1 g total) by mouth 4 (four) times daily -  with meals and at bedtime.  Smoker Comments: The patient admitted that he had a setback after getting home from the hospital, but is working on quitting again. On Chantix.  Reviewed strategies to maximize success, including removing cigarettes and smoking materials from environment, stress management, substitution of other forms of reinforcement and support of family/friends.  Atherosclerosis of native artery of right lower extremity with intermittent claudication ([place]) Comments: I spoke with Dr. Bridgett Larsson, Vascular. He would like to make sure that the patient is is stable re: Hgb and that the team has more time to evaluate his anemia since there was not a source found during his hospitalization. The surgery was pushed to 07/17/16. The patient and is wife had several other questions re: the surgery itself, so I went ahead and made an appointment for them to see Dr. Bridgett Larsson on Friday to discuss.   Antiphospholipid antibody positive Comments:             2+ tests. Will need Coumadin after surgery.   . Reviewed expectations re: course of current medical issues. . Discussed self-management of symptoms. . Outlined signs and symptoms indicating need for more acute intervention. . Patient verbalized understanding and all questions were answered. . See orders for this visit as documented in the electronic medical record. . Patient received an After-Visit Summary.   Briscoe Deutscher, D.O. [place], [facility]

## 2016-07-12 ENCOUNTER — Telehealth: Payer: Self-pay

## 2016-07-12 MED FILL — OXYCODONE/APAP 5/325 MG TAB: 5-325 | 5 days supply | Qty: 30 | Fill #0

## 2016-07-12 NOTE — Telephone Encounter (Signed)
Patient notified of results.  Very happy to know Hgb has increased!  Verbalized understanding to all.

## 2016-07-12 NOTE — Telephone Encounter (Signed)
-----   Message from Briscoe Deutscher, DO sent at 07/12/2016  7:40 AM EST ----- Please let this patient know that his Hgb is increasing! Looks like iron deficiency. Now 10.6. Kidney and liver functions are normal. I reviewed antiphospholipid antibody syndrome + clots + treatment and only found recommendations for Coumadin. As I understand it, there is just not enough data yet when it comes to the newer medications. I believe the recommendation will continue to be to take Coumadin for the foreseeable future, but make sure to ask Hematology. EW

## 2016-07-14 ENCOUNTER — Encounter: Payer: Self-pay | Admitting: Vascular Surgery

## 2016-07-14 ENCOUNTER — Ambulatory Visit (HOSPITAL_BASED_OUTPATIENT_CLINIC_OR_DEPARTMENT_OTHER): Payer: 59 | Admitting: Hematology

## 2016-07-14 ENCOUNTER — Ambulatory Visit (HOSPITAL_BASED_OUTPATIENT_CLINIC_OR_DEPARTMENT_OTHER): Payer: 59

## 2016-07-14 ENCOUNTER — Other Ambulatory Visit (HOSPITAL_BASED_OUTPATIENT_CLINIC_OR_DEPARTMENT_OTHER): Payer: 59

## 2016-07-14 ENCOUNTER — Ambulatory Visit: Payer: 59 | Admitting: Vascular Surgery

## 2016-07-14 ENCOUNTER — Ambulatory Visit (INDEPENDENT_AMBULATORY_CARE_PROVIDER_SITE_OTHER): Payer: 59 | Admitting: Vascular Surgery

## 2016-07-14 ENCOUNTER — Encounter (HOSPITAL_COMMUNITY): Payer: 59

## 2016-07-14 ENCOUNTER — Encounter: Payer: Self-pay | Admitting: Hematology

## 2016-07-14 VITALS — BP 137/86 | HR 69 | Temp 98.8°F | Resp 18

## 2016-07-14 VITALS — BP 145/74 | HR 66 | Temp 97.9°F | Resp 18 | Ht 69.5 in | Wt 186.4 lb

## 2016-07-14 VITALS — BP 133/80 | HR 69 | Temp 97.1°F | Resp 18 | Ht 69.5 in | Wt 185.0 lb

## 2016-07-14 DIAGNOSIS — D500 Iron deficiency anemia secondary to blood loss (chronic): Secondary | ICD-10-CM

## 2016-07-14 DIAGNOSIS — I745 Embolism and thrombosis of iliac artery: Secondary | ICD-10-CM | POA: Diagnosis not present

## 2016-07-14 DIAGNOSIS — Z720 Tobacco use: Secondary | ICD-10-CM

## 2016-07-14 DIAGNOSIS — R760 Raised antibody titer: Secondary | ICD-10-CM | POA: Diagnosis not present

## 2016-07-14 DIAGNOSIS — I70211 Atherosclerosis of native arteries of extremities with intermittent claudication, right leg: Secondary | ICD-10-CM

## 2016-07-14 DIAGNOSIS — D649 Anemia, unspecified: Secondary | ICD-10-CM

## 2016-07-14 LAB — COMPREHENSIVE METABOLIC PANEL
ALT: 103 U/L — AB (ref 0–55)
ANION GAP: 8 meq/L (ref 3–11)
AST: 59 U/L — ABNORMAL HIGH (ref 5–34)
Albumin: 4 g/dL (ref 3.5–5.0)
Alkaline Phosphatase: 92 U/L (ref 40–150)
BUN: 6.9 mg/dL — ABNORMAL LOW (ref 7.0–26.0)
CALCIUM: 9.7 mg/dL (ref 8.4–10.4)
CO2: 25 meq/L (ref 22–29)
Chloride: 105 mEq/L (ref 98–109)
Creatinine: 0.9 mg/dL (ref 0.7–1.3)
Glucose: 115 mg/dl (ref 70–140)
Potassium: 4.1 mEq/L (ref 3.5–5.1)
Sodium: 138 mEq/L (ref 136–145)
Total Bilirubin: 0.78 mg/dL (ref 0.20–1.20)
Total Protein: 7.5 g/dL (ref 6.4–8.3)

## 2016-07-14 LAB — CBC WITH DIFFERENTIAL/PLATELET
BASO%: 0.6 % (ref 0.0–2.0)
Basophils Absolute: 0.1 10*3/uL (ref 0.0–0.1)
EOS ABS: 0.5 10*3/uL (ref 0.0–0.5)
EOS%: 4.1 % (ref 0.0–7.0)
HEMATOCRIT: 39.6 % (ref 38.4–49.9)
HGB: 11.4 g/dL — ABNORMAL LOW (ref 13.0–17.1)
LYMPH#: 2.2 10*3/uL (ref 0.9–3.3)
LYMPH%: 17.8 % (ref 14.0–49.0)
MCH: 21.3 pg — ABNORMAL LOW (ref 27.2–33.4)
MCHC: 28.8 g/dL — AB (ref 32.0–36.0)
MCV: 74.2 fL — AB (ref 79.3–98.0)
MONO#: 0.5 10*3/uL (ref 0.1–0.9)
MONO%: 4.3 % (ref 0.0–14.0)
NEUT%: 73.2 % (ref 39.0–75.0)
NEUTROS ABS: 9.1 10*3/uL — AB (ref 1.5–6.5)
PLATELETS: 249 10*3/uL (ref 140–400)
RBC: 5.34 10*6/uL (ref 4.20–5.82)
RDW: 25.3 % — ABNORMAL HIGH (ref 11.0–14.6)
WBC: 12.4 10*3/uL — ABNORMAL HIGH (ref 4.0–10.3)

## 2016-07-14 MED ORDER — SODIUM CHLORIDE 0.9 % IV SOLN
Freq: Once | INTRAVENOUS | Status: AC
  Administered 2016-07-14: 13:00:00 via INTRAVENOUS

## 2016-07-14 MED ORDER — SODIUM CHLORIDE 0.9 % IV SOLN
510.0000 mg | Freq: Once | INTRAVENOUS | Status: AC
  Administered 2016-07-14: 510 mg via INTRAVENOUS
  Filled 2016-07-14: qty 17

## 2016-07-14 NOTE — Progress Notes (Signed)
Dr. Ermalene Postin, Anesthesia,  reviewed Dr. Lianne Moris progress note dated 07/14/16 and Myra Gianotti, PA, Anesthesia,  progress note dated 07/05/16  "pt okay."  No new orders given.

## 2016-07-14 NOTE — Progress Notes (Signed)
   Established Intermittent Claudication  Previous technical details were discussed with the patient again.  New diagnosis of antiphospholipid syndrome discussed with patient.  Only difference will be continued anticoagulation post-operative with bridge to coumadin.  We discussed the two options:  1.  Continue with original plan for femorofemoral bypass 2.  Attempt thrombectomy of R CIA stent and forgo fem-fem BPG if successful  Pt is aware that additional procedure might be needed depending on findings of post-thrombectomy aortogram.  In the event of failure of the thrombectomy, would procedure with #1.   Pt elects to proceed with option #2.  I discussed with the patient the nature of angiographic procedures, especially the limited patencies of any endovascular intervention.    The patient is aware of that the risks of an angiographic procedure include but are not limited to: bleeding, infection, access site complications, renal failure, embolization, rupture of vessel, dissection, arteriovenous fistula, possible need for emergent surgical intervention, possible need for surgical procedures to treat the patient's pathology, anaphylactic reaction to contrast, and stroke and death.    The patient is aware of the risks and agrees to proceed.  Will plan on bridging patient with Heparin to coumadin given pt's "migraine" response to Lovenox  Pre-op counseling for fem-fem BPG was completed previously.   [name], MD, FACS [facility] Office: [phone] Pager: [phone]  07/14/2016, 8:36 AM

## 2016-07-14 NOTE — Patient Instructions (Addendum)
Ferumoxytol injection What is this medicine? FERUMOXYTOL is an iron complex. Iron is used to make healthy red blood cells, which carry oxygen and nutrients throughout the body. This medicine is used to treat iron deficiency anemia in people with chronic kidney disease. COMMON BRAND NAME(S): Feraheme What should I tell my health care provider before I take this medicine? They need to know if you have any of these conditions: -anemia not caused by low iron levels -high levels of iron in the blood -magnetic resonance imaging (MRI) test scheduled -an unusual or allergic reaction to iron, other medicines, foods, dyes, or preservatives -pregnant or trying to get pregnant -breast-feeding How should I use this medicine? This medicine is for injection into a vein. It is given by a health care professional in a hospital or clinic setting. Talk to your pediatrician regarding the use of this medicine in children. Special care may be needed. What if I miss a dose? It is important not to miss your dose. Call your doctor or health care professional if you are unable to keep an appointment. What may interact with this medicine? This medicine may interact with the following medications: -other iron products What should I watch for while using this medicine? Visit your doctor or healthcare professional regularly. Tell your doctor or healthcare professional if your symptoms do not start to get better or if they get worse. You may need blood work done while you are taking this medicine. You may need to follow a special diet. Talk to your doctor. Foods that contain iron include: whole grains/cereals, dried fruits, beans, or peas, leafy green vegetables, and organ meats (liver, kidney). What side effects may I notice from receiving this medicine? Side effects that you should report to your doctor or health care professional as soon as possible: -allergic reactions like skin rash, itching or hives, swelling of the  face, lips, or tongue -breathing problems -changes in blood pressure -feeling faint or lightheaded, falls -fever or chills -flushing, sweating, or hot feelings -swelling of the ankles or feet Side effects that usually do not require medical attention (report to your doctor or health care professional if they continue or are bothersome): -diarrhea -headache -nausea, vomiting -stomach pain Where should I keep my medicine? This drug is given in a hospital or clinic and will not be stored at home.  2017 Elsevier/Gold Standard (2015-06-10 12:41:49)  

## 2016-07-16 NOTE — Progress Notes (Signed)
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  HISTORY OF PRESENTING ILLNESS:   [name] is a wonderful 51 y.o. 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  Patient has a history of smoking 1-1/2 packs per day for about 40 years, dyslipidemia but no other known significant chronic medical comorbidities. He has not had a primary care physician and routine screenings.  He presented to the vascular surgeon in September/October 2017 with claudication of his lower extremities especially cramping discomfort in his buttocks and right thigh and was noted to have a reduced ABI of 0.8 on the right side. 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 He underwent right common iliac artery stenting with significant improvement in his right lower extremity claudication symptoms. He was apparently placed on Plavix at the time.  He presented again in late December 2017 with acute worsening of his right lower extremity pain and had repeat angiography on 05/11/2016 which showed the previously placed common iliac stent was occluded and this was retreated. No obvious etiology was noted.  Patient  was started on aspirin in addition to Plavix. Concern for a clotting disorder versus incomplete stent expansion versus edge dissection were entertained. Patient had anticardiolipin antibody and beta-2 glycoprotein antibody testing. He was noted to have a significantly elevated beta-2 glycoprotein IgG that was concerning for antiphospholipid antibody syndrome.  Patient was tentatively plan to have arterial bypass surgery on 07/11/2016 with Dr. Bridgett Larsson. He has been seen by Dr. Debara Pickett and had a nuclear cardiac stress test which showed a slightly reduced ejection fraction of 44% but negative for ischemia. Repeat echo showed normal ejection fraction.  Patient has had some mild anemia with hemoglobin of 10.5 when he first had his cardiac angiogram of 10.2 on the subsequent angiogram. He does not report any overt bleeding on his aspirin plus Plavix. Does note some mild hemorrhoidal bleeding on and off for the last 1 year.   INTERVAL HISTORY  [name] is here for his post hospitalization followup. He received 2 units of PRBCs and 1 dose of IV Feraheme. EGD and colonoscopy did not show any evidence of overt GI bleeding or a source of bleeding. He has a follow-up with gastroenterology in about 3-4 weeks for additional workup as needed. His hemoglobin has improved to 11.4 and he feels much better and feels that his right leg is feeling better with correction of his anemia as well. He is scheduled for another dose of IV iron today and we decided to hold off on the subsequent one. He is scheduled for his right lower extremity vascular surgery on 07/17/2016 by Dr. Bridgett Larsson. Patient notes that they're going to try to open up his stent and only if that is not possible then we will do a vascular bypass. No other acute new symptoms. No overt GI bleeding. He  is on Lovenox and has recommendations to hold it prior to surgery.  MEDICAL HISTORY:  Past Medical History:  Diagnosis Date  . Anemia   . Anxiety   . Blood  dyscrasia    clotting  disorder  . Dyspnea    w/ exertion    hx pleuralsy yrs ago  . GERD (gastroesophageal reflux disease)   . Hyperlipidemia   . PAD (peripheral artery disease) ([place])   . Smoker     SURGICAL HISTORY: Past Surgical History:  Procedure Laterality Date  . COLONOSCOPY N/A 07/08/2016   Procedure: COLONOSCOPY;  Surgeon: Clarene Essex, MD;  Location: [facility];  Service: Endoscopy;  Laterality: N/A;  . ESOPHAGOGASTRODUODENOSCOPY N/A 07/08/2016   Procedure: ESOPHAGOGASTRODUODENOSCOPY (EGD);  Surgeon: Clarene Essex, MD;  Location: [facility];  Service: Endoscopy;  Laterality: N/A;  . KNEE SURGERY    . neck surger    . NECK SURGERY    . PERIPHERAL VASCULAR CATHETERIZATION N/A 02/24/2016   Procedure: Abdominal Aortogram w/Lower Extremity;  Surgeon: Conrad [place], MD;  Location: [place] [facility];  Service: Cardiovascular;  Laterality: N/A;  . PERIPHERAL VASCULAR CATHETERIZATION Right 02/24/2016   Procedure: Peripheral Vascular Intervention;  Surgeon: Conrad [place], MD;  Location: [place] [facility];  Service: Cardiovascular;  Laterality: Right;  Common iliac  . PERIPHERAL VASCULAR CATHETERIZATION N/A 05/11/2016   Procedure: Abdominal Aortogram;  Surgeon: Conrad [place], MD;  Location: [place] [facility];  Service: Cardiovascular;  Laterality: N/A;  . PERIPHERAL VASCULAR CATHETERIZATION N/A 05/11/2016   Procedure: Lower Extremity Angiography;  Surgeon: Conrad , MD;  Location: [place] [facility];  Service: Cardiovascular;  Laterality: N/A;    SOCIAL HISTORY: Social History   Social History  . Marital status: Married    Spouse name: N/A  . Number of children: N/A  . Years of education: N/A   Occupational History  . Not on file.   Social History Main Topics  . Smoking status: Current Every Day Smoker    Packs/day: 0.50    Types: Cigarettes, E-cigarettes  . Smokeless tobacco: Never Used     Comment: 1/2 pk per day.  Vaporizor has 0% nicotine.  Trying to quit  . Alcohol  use No     Comment: hx  etoh  . Drug use: No  . Sexual activity: Yes   Other Topics Concern  . Not on file   Social History Narrative  . No narrative on file    FAMILY HISTORY: Family History  Problem Relation Age of Onset  . Cancer Father     Lung    ALLERGIES:  has No Known Allergies.  MEDICATIONS:  Current Outpatient Prescriptions  Medication Sig Dispense Refill  . CHANTIX CONTINUING MONTH PAK 1 MG tablet Take 1 mg by mouth 2 (two) times daily.  1  . docusate sodium (COLACE) 100 MG capsule Take 1 capsule (100 mg total) by mouth 2 (two) times daily. 10 capsule 0  . DULoxetine (CYMBALTA) 30 MG capsule Take one 30 mg tablet by mouth once a day for the first week. Then increase to two 30 mg tablets ( total 60mg ) by mouth once daily. 60 capsule 3  . enoxaparin (LOVENOX) 80 MG/0.8ML injection Inject 0.8 mLs (80 mg total) into the skin every 12 (twelve) hours. 30 Syringe 0  . esomeprazole (NEXIUM) 20 MG capsule Take 20 mg by mouth daily.     . ferrous sulfate 325 (65 FE) MG tablet Take 1 tablet (325 mg total) by  mouth daily with breakfast. 30 tablet 0  . gabapentin (NEURONTIN) 100 MG capsule Take 2 capsules (200 mg total) by mouth 3 (three) times daily. (Patient taking differently: Take 200 mg by mouth at bedtime. ) 180 capsule 3  . mupirocin ointment (BACTROBAN) 2 % Apply 1 application topically 2 (two) times daily.  0  . oxyCODONE-acetaminophen (PERCOCET/ROXICET) 5-325 MG tablet Take 1 tablet by mouth every 4 (four) hours as needed for severe pain. 30 tablet 0  . rosuvastatin (CRESTOR) 5 MG tablet Take 1 tablet (5 mg total) by mouth daily. 30 tablet 5  . sucralfate (CARAFATE) 1 g tablet Take 1 tablet (1 g total) by mouth 4 (four) times daily -  with meals and at bedtime. 120 tablet 0   No current facility-administered medications for this visit.     REVIEW OF SYSTEMS:    10 Point review of Systems was done is negative except as noted above.  PHYSICAL EXAMINATION: ECOG  PERFORMANCE STATUS: 1 - Symptomatic but completely ambulatory  . Vitals:   07/14/16 1125  BP: (!) 145/74  Pulse: 66  Resp: 18  Temp: 97.9 F (36.6 C)   Filed Weights   07/14/16 1125  Weight: 186 lb 6.4 oz (84.6 kg)   .Body mass index is 27.13 kg/m.  GENERAL:alert, in no acute distress and comfortable SKIN: skin color, texture, turgor are normal, no rashes or significant lesions EYES: normal, conjunctiva are pink and non-injected, sclera clear OROPHARYNX:no exudate, no erythema and lips, buccal mucosa, and tongue normal  NECK: supple, no JVD, thyroid normal size, non-tender, without nodularity LYMPH:  no palpable lymphadenopathy in the cervical, axillary or inguinal LUNGS: clear to auscultation with normal respiratory effort HEART: regular rate & rhythm,  no murmurs and no lower extremity edema ABDOMEN: abdomen soft, non-tender, normoactive bowel sounds  Musculoskeletal: Right lower extremity degrees posterior tibial and DP pulses. PSYCH: alert & oriented x 3 with fluent speech NEURO: no focal motor/sensory deficits  LABORATORY DATA:  I have reviewed the data as listed  . CBC Latest Ref Rng & Units 07/14/2016 07/11/2016 07/08/2016  WBC 4.0 - 10.3 10e3/uL 12.4(H) 12.4(H) 9.5  Hemoglobin 13.0 - 17.1 g/dL 11.4(L) 10.6(L) 9.7(L)  Hematocrit 38.4 - 49.9 % 39.6 35.9(L) 34.0(L)  Platelets 140 - 400 10e3/uL 249 314.0 259    . CMP Latest Ref Rng & Units 07/14/2016 07/11/2016 07/07/2016  Glucose 70 - 140 mg/dl 115 85 95  BUN 7.0 - 26.0 mg/dL 6.9(L) 7 6  Creatinine 0.7 - 1.3 mg/dL 0.9 0.85 1.01  Sodium 136 - 145 mEq/L 138 140 140  Potassium 3.5 - 5.1 mEq/L 4.1 4.4 4.1  Chloride 96 - 112 mEq/L - 105 109  CO2 22 - 29 mEq/L 25 28 24   Calcium 8.4 - 10.4 mg/dL 9.7 10.2 8.9  Total Protein 6.4 - 8.3 g/dL 7.5 7.5 -  Total Bilirubin 0.20 - 1.20 mg/dL 0.78 0.6 -  Alkaline Phos 40 - 150 U/L 92 72 -  AST 5 - 34 U/L 59(H) 36 -  ALT 0 - 55 U/L 103(H) 44 -   Component     Latest Ref Rng &  Units 06/20/2016         8:37 AM  Phosphatidylserine IgG Autoantibodies     <10 U/mL <10  Phosphatydalserine, IgM     <25 U/mL <25  Phosphatydalserine, IgA     <20 U/mL <20  Beta-2 Glycoprotein I Ab, IgG     <=20 SGU 150 (H)  Beta-2-Glycoprotein I IgM     <=  20 SMU 10  Beta-2-Glycoprotein I IgA     <=20 SAU 11  Anticardiolipin Ab,IgA,Qn     APL <11  Anticardiolipin Ab,IgG,Qn     GPL <14  Anticardiolipin Ab,IgM,Qn     MPL <12  Result      REPORT  Interpretation      REPORT  Reviewer      REPORT  Interpretation        Reviewer        Act.Prt.C Resist.     >=2.1 ratio 4.3  Antithrombin Activity     80 - 120 % activity 118    RADIOGRAPHIC STUDIES: I have personally reviewed the radiological images as listed and agreed with the findings in the report. No results found.  ASSESSMENT & PLAN:   51 year old gentleman with history of dyslipidemia and chronic smoker with  1) Rt common Iliac artery thrombosis. This was initially noted to be related to an arterial dissection. Other risk factors at baseline include strong history of smoking.  Patient's right common iliac artery was stented and he had a repeat thrombosis while on Plavix noted on 05/11/2016. Aspirin was added at this time. Patient notes that he still having right buttock and thigh claudications and some coldness and paresthesias intermittently in his right foot but that these are much improved.  Noted to have elevated beta-2 glycoprotein IgG with titers that were significantly elevated at 150  2) Elevated beta-2 glycoprotein IgG - cannot r/o antiphospholipid antibody syndrome as a result of this. To prove this is clinical significant will need to demonstrate persistence of ab in 12 weeks Given his presentation - there is enough concern that we would need to treat this as APLA syndrome.  3) severe iron deficiency anemia. Unclear source of blood loss. He has had some chronic hemorrhoidal bleeding which could be  part of the reason. Recent EGD and colonoscopy did not show any overt signs of bleeding or concerning lesions. He was cleared by gastroenterology for ongoing anticoagulation. Anemia is improving after 2 units of PRBCs and IV iron. He is currently on therapeutic Lovenox without any overt issues with bleeding and improving CBC. PLAN -He'll get his second dose of IV iron today. -He is to follow-up with Dr. Bridgett Larsson for his vascular surgery on 07/17/2016. -He will take his last dose of therapeutic Lovenox on the morning before the day of surgery and hold his evening dose. -After surgery he needs to be on prophylactic Lovenox as soon as possible -After cleared by surgery he needs to go back to therapeutic Lovenox as well as Coumadin. Lovenox will need to be discontinued after Coumadin is therapeutic and after at least a 5 day overlap. -Patient will need to continue follow-up with his primary care physician for monitoring and adjustment of his Coumadin post discharge surgery. -We shall see him back in about 6 weeks to recheck his iron labs and discuss Coumadin alternatives for anticoagulation if the patient so desires. -Antiplatelet therapy as per vascular surgery. He would prefer single agent antiplatelet therapy due to risk of bleeding unless dual antiplatelet therapy is absolutely needed. - We will repeat his antiphospholipid antibody panel in about 3 months from his previous labs to confirm the diagnosis .  IV Iron today as schedule Discontinue next IV iron infusion schedule in a week RTC with Dr Irene Limbo in 2 months with labs  All of the patients questions were answered with apparent satisfaction. The patient knows to call the clinic with any problems, questions or concerns.  I spent 20 minutes counseling the patient face to face. The total time spent in the appointment was 25 minutes and more than 50% was on counseling and direct patient cares.    Sullivan Lone MD [place] AAHIVMS [facility] Hematology/Oncology  Physician [facility]  (Office):       [phone] (Work cell):  [phone] (Fax):           [phone]

## 2016-07-17 ENCOUNTER — Encounter (HOSPITAL_COMMUNITY): Payer: Self-pay | Admitting: Anesthesiology

## 2016-07-17 ENCOUNTER — Inpatient Hospital Stay (HOSPITAL_COMMUNITY): Payer: 59 | Admitting: Anesthesiology

## 2016-07-17 ENCOUNTER — Encounter (HOSPITAL_COMMUNITY)
Admission: RE | Disposition: A | Discharge status: Home / Self Care | Payer: Self-pay | Source: Ambulatory Visit | Attending: Vascular Surgery

## 2016-07-17 ENCOUNTER — Inpatient Hospital Stay (HOSPITAL_COMMUNITY)
Admission: RE | Admit: 2016-07-17 | Discharge: 2016-07-26 | DRG: 271 | Disposition: A | Discharge status: Home / Self Care | Payer: 59 | Source: Ambulatory Visit | Attending: Vascular Surgery | Admitting: Vascular Surgery

## 2016-07-17 ENCOUNTER — Inpatient Hospital Stay (HOSPITAL_COMMUNITY): Payer: 59

## 2016-07-17 DIAGNOSIS — I70211 Atherosclerosis of native arteries of extremities with intermittent claudication, right leg: Principal | ICD-10-CM | POA: Diagnosis present

## 2016-07-17 DIAGNOSIS — K219 Gastro-esophageal reflux disease without esophagitis: Secondary | ICD-10-CM | POA: Diagnosis present

## 2016-07-17 DIAGNOSIS — F418 Other specified anxiety disorders: Secondary | ICD-10-CM | POA: Diagnosis not present

## 2016-07-17 DIAGNOSIS — E785 Hyperlipidemia, unspecified: Secondary | ICD-10-CM | POA: Diagnosis present

## 2016-07-17 DIAGNOSIS — I97638 Postprocedural hematoma of a circulatory system organ or structure following other circulatory system procedure: Secondary | ICD-10-CM | POA: Diagnosis not present

## 2016-07-17 DIAGNOSIS — Z7902 Long term (current) use of antithrombotics/antiplatelets: Secondary | ICD-10-CM | POA: Diagnosis not present

## 2016-07-17 DIAGNOSIS — I739 Peripheral vascular disease, unspecified: Secondary | ICD-10-CM

## 2016-07-17 DIAGNOSIS — I8291 Chronic embolism and thrombosis of unspecified vein: Secondary | ICD-10-CM | POA: Diagnosis present

## 2016-07-17 DIAGNOSIS — S301XXA Contusion of abdominal wall, initial encounter: Secondary | ICD-10-CM | POA: Diagnosis not present

## 2016-07-17 DIAGNOSIS — I745 Embolism and thrombosis of iliac artery: Secondary | ICD-10-CM | POA: Diagnosis present

## 2016-07-17 DIAGNOSIS — Y838 Other surgical procedures as the cause of abnormal reaction of the patient, or of later complication, without mention of misadventure at the time of the procedure: Secondary | ICD-10-CM | POA: Diagnosis not present

## 2016-07-17 DIAGNOSIS — I82521 Chronic embolism and thrombosis of right iliac vein: Secondary | ICD-10-CM | POA: Diagnosis not present

## 2016-07-17 DIAGNOSIS — D759 Disease of blood and blood-forming organs, unspecified: Secondary | ICD-10-CM | POA: Diagnosis not present

## 2016-07-17 DIAGNOSIS — I97621 Postprocedural hematoma of a circulatory system organ or structure following other procedure: Secondary | ICD-10-CM | POA: Diagnosis not present

## 2016-07-17 DIAGNOSIS — I70221 Atherosclerosis of native arteries of extremities with rest pain, right leg: Secondary | ICD-10-CM | POA: Diagnosis not present

## 2016-07-17 DIAGNOSIS — F17200 Nicotine dependence, unspecified, uncomplicated: Secondary | ICD-10-CM | POA: Diagnosis present

## 2016-07-17 DIAGNOSIS — Y828 Other medical devices associated with adverse incidents: Secondary | ICD-10-CM | POA: Diagnosis not present

## 2016-07-17 DIAGNOSIS — Z419 Encounter for procedure for purposes other than remedying health state, unspecified: Secondary | ICD-10-CM

## 2016-07-17 DIAGNOSIS — D6861 Antiphospholipid syndrome: Secondary | ICD-10-CM | POA: Diagnosis present

## 2016-07-17 DIAGNOSIS — N400 Enlarged prostate without lower urinary tract symptoms: Secondary | ICD-10-CM | POA: Diagnosis present

## 2016-07-17 DIAGNOSIS — Z809 Family history of malignant neoplasm, unspecified: Secondary | ICD-10-CM

## 2016-07-17 DIAGNOSIS — Z9889 Other specified postprocedural states: Secondary | ICD-10-CM

## 2016-07-17 DIAGNOSIS — I82421 Acute embolism and thrombosis of right iliac vein: Secondary | ICD-10-CM | POA: Diagnosis not present

## 2016-07-17 HISTORY — PX: ANGIOPLASTY ILLIAC ARTERY: SHX5720

## 2016-07-17 HISTORY — PX: INTRAOPERATIVE ARTERIOGRAM: SHX5157

## 2016-07-17 HISTORY — PX: THROMBECTOMY FEMORAL ARTERY: SHX6406

## 2016-07-17 HISTORY — PX: PATCH ANGIOPLASTY: SHX6230

## 2016-07-17 LAB — TYPE AND SCREEN
ABO/RH(D): A POS
ABO/RH(D): A POS
Antibody Screen: NEGATIVE
Antibody Screen: NEGATIVE

## 2016-07-17 LAB — POCT I-STAT, CHEM 8
BUN: 8 mg/dL (ref 6–20)
CHLORIDE: 106 mmol/L (ref 101–111)
CREATININE: 0.8 mg/dL (ref 0.61–1.24)
Calcium, Ion: 1.17 mmol/L (ref 1.15–1.40)
GLUCOSE: 101 mg/dL — AB (ref 65–99)
HEMATOCRIT: 35 % — AB (ref 39.0–52.0)
Hemoglobin: 11.9 g/dL — ABNORMAL LOW (ref 13.0–17.0)
POTASSIUM: 4.1 mmol/L (ref 3.5–5.1)
Sodium: 140 mmol/L (ref 135–145)
TCO2: 25 mmol/L (ref 0–100)

## 2016-07-17 LAB — HEPARIN LEVEL (UNFRACTIONATED): HEPARIN UNFRACTIONATED: 0.51 [IU]/mL (ref 0.30–0.70)

## 2016-07-17 LAB — APTT: aPTT: >200 seconds (ref 24–36)

## 2016-07-17 SURGERY — THROMBECTOMY, ARTERY, FEMORAL
Anesthesia: General | Site: Groin | Laterality: Right

## 2016-07-17 MED ORDER — DEXTROSE 5 % IV SOLN
1.5000 g | INTRAVENOUS | Status: AC
  Administered 2016-07-17: 1.5 g via INTRAVENOUS
  Filled 2016-07-17: qty 1.5

## 2016-07-17 MED ORDER — OXYCODONE-ACETAMINOPHEN 5-325 MG PO TABS
ORAL_TABLET | ORAL | Status: AC
  Filled 2016-07-17: qty 1

## 2016-07-17 MED ORDER — HEPARIN SODIUM (PORCINE) 1000 UNIT/ML IJ SOLN
INTRAMUSCULAR | Status: DC | PRN
  Administered 2016-07-17: 8500 [IU] via INTRAVENOUS

## 2016-07-17 MED ORDER — ROSUVASTATIN CALCIUM 10 MG PO TABS
5.0000 mg | ORAL_TABLET | Freq: Every day | ORAL | Status: DC
  Administered 2016-07-18 – 2016-07-26 (×8): 5 mg via ORAL
  Filled 2016-07-17 (×10): qty 1

## 2016-07-17 MED ORDER — MUPIROCIN 2 % EX OINT
1.0000 "application " | TOPICAL_OINTMENT | Freq: Once | CUTANEOUS | Status: AC
  Administered 2016-07-17: 1 via TOPICAL

## 2016-07-17 MED ORDER — GUAIFENESIN-DM 100-10 MG/5ML PO SYRP: 15.0000 mL | ORAL_SOLUTION | ORAL | Status: DC | PRN

## 2016-07-17 MED ORDER — PANTOPRAZOLE SODIUM 40 MG PO TBEC
40.0000 mg | DELAYED_RELEASE_TABLET | Freq: Every day | ORAL | Status: DC
  Administered 2016-07-18 – 2016-07-26 (×8): 40 mg via ORAL
  Filled 2016-07-17 (×10): qty 1

## 2016-07-17 MED ORDER — GABAPENTIN 100 MG PO CAPS
200.0000 mg | ORAL_CAPSULE | Freq: Every day | ORAL | Status: DC
  Administered 2016-07-17 – 2016-07-25 (×9): 200 mg via ORAL
  Filled 2016-07-17 (×9): qty 2

## 2016-07-17 MED ORDER — LACTATED RINGERS IV SOLN
INTRAVENOUS | Status: DC
  Administered 2016-07-17: 50 mL/h via INTRAVENOUS
  Administered 2016-07-17 (×2): via INTRAVENOUS

## 2016-07-17 MED ORDER — LIDOCAINE HCL (CARDIAC) 20 MG/ML IV SOLN
INTRAVENOUS | Status: DC | PRN
  Administered 2016-07-17: 100 mg via INTRAVENOUS

## 2016-07-17 MED ORDER — SODIUM CHLORIDE 0.9 % IV SOLN: INTRAVENOUS | Status: DC

## 2016-07-17 MED ORDER — PROPOFOL 10 MG/ML IV BOLUS
INTRAVENOUS | Status: DC | PRN
  Administered 2016-07-17: 200 mg via INTRAVENOUS

## 2016-07-17 MED ORDER — ARTIFICIAL TEARS OP OINT
TOPICAL_OINTMENT | OPHTHALMIC | Status: AC
  Filled 2016-07-17: qty 3.5

## 2016-07-17 MED ORDER — METOPROLOL TARTRATE 5 MG/5ML IV SOLN: 2.0000 mg | INTRAVENOUS | Status: DC | PRN

## 2016-07-17 MED ORDER — IODIXANOL 320 MG/ML IV SOLN
INTRAVENOUS | Status: DC | PRN
  Administered 2016-07-17: 50 mL via INTRAVENOUS

## 2016-07-17 MED ORDER — OXYCODONE-ACETAMINOPHEN 5-325 MG PO TABS
1.0000 | ORAL_TABLET | ORAL | Status: DC | PRN
  Administered 2016-07-17 – 2016-07-22 (×21): 1 via ORAL
  Filled 2016-07-17 (×21): qty 1

## 2016-07-17 MED ORDER — MIDAZOLAM HCL 2 MG/2ML IJ SOLN
INTRAMUSCULAR | Status: AC
  Filled 2016-07-17: qty 2

## 2016-07-17 MED ORDER — 0.9 % SODIUM CHLORIDE (POUR BTL) OPTIME
TOPICAL | Status: DC | PRN
  Administered 2016-07-17: 2000 mL

## 2016-07-17 MED ORDER — GLYCOPYRROLATE 0.2 MG/ML IJ SOLN
INTRAMUSCULAR | Status: DC | PRN
  Administered 2016-07-17: 0.4 mg via INTRAVENOUS

## 2016-07-17 MED ORDER — FERROUS SULFATE 325 (65 FE) MG PO TABS
325.0000 mg | ORAL_TABLET | Freq: Every day | ORAL | Status: DC
  Administered 2016-07-19 – 2016-07-26 (×7): 325 mg via ORAL
  Filled 2016-07-17 (×8): qty 1

## 2016-07-17 MED ORDER — PROPOFOL 10 MG/ML IV BOLUS
INTRAVENOUS | Status: AC
  Filled 2016-07-17: qty 40

## 2016-07-17 MED ORDER — BISACODYL 5 MG PO TBEC: 5.0000 mg | DELAYED_RELEASE_TABLET | Freq: Every day | ORAL | Status: DC | PRN

## 2016-07-17 MED ORDER — PHENYLEPHRINE HCL 10 MG/ML IJ SOLN
INTRAMUSCULAR | Status: AC
  Filled 2016-07-17: qty 1

## 2016-07-17 MED ORDER — DOCUSATE SODIUM 100 MG PO CAPS
100.0000 mg | ORAL_CAPSULE | Freq: Two times a day (BID) | ORAL | Status: DC
  Administered 2016-07-17 – 2016-07-26 (×17): 100 mg via ORAL
  Filled 2016-07-17 (×18): qty 1

## 2016-07-17 MED ORDER — HYDROMORPHONE HCL 1 MG/ML IJ SOLN
0.2500 mg | INTRAMUSCULAR | Status: DC | PRN
  Administered 2016-07-17 (×3): 0.5 mg via INTRAVENOUS

## 2016-07-17 MED ORDER — FENTANYL CITRATE (PF) 100 MCG/2ML IJ SOLN
INTRAMUSCULAR | Status: DC | PRN
  Administered 2016-07-17 (×4): 50 ug via INTRAVENOUS

## 2016-07-17 MED ORDER — ONDANSETRON HCL 4 MG/2ML IJ SOLN
INTRAMUSCULAR | Status: DC | PRN
  Administered 2016-07-17: 4 mg via INTRAVENOUS

## 2016-07-17 MED ORDER — PHENYLEPHRINE HCL 10 MG/ML IJ SOLN
INTRAMUSCULAR | Status: DC | PRN
  Administered 2016-07-17: 25 ug/min via INTRAVENOUS

## 2016-07-17 MED ORDER — ROCURONIUM BROMIDE 100 MG/10ML IV SOLN
INTRAVENOUS | Status: DC | PRN
  Administered 2016-07-17: 10 mg via INTRAVENOUS
  Administered 2016-07-17: 50 mg via INTRAVENOUS
  Administered 2016-07-17: 10 mg via INTRAVENOUS

## 2016-07-17 MED ORDER — NEOSTIGMINE METHYLSULFATE 5 MG/5ML IV SOSY
PREFILLED_SYRINGE | INTRAVENOUS | Status: AC
  Filled 2016-07-17: qty 5

## 2016-07-17 MED ORDER — MAGNESIUM SULFATE 2 GM/50ML IV SOLN
2.0000 g | Freq: Every day | INTRAVENOUS | Status: DC | PRN
  Filled 2016-07-17: qty 50

## 2016-07-17 MED ORDER — MUPIROCIN 2 % EX OINT
TOPICAL_OINTMENT | CUTANEOUS | Status: AC
  Filled 2016-07-17: qty 22

## 2016-07-17 MED ORDER — LABETALOL HCL 5 MG/ML IV SOLN: 10.0000 mg | INTRAVENOUS | Status: DC | PRN

## 2016-07-17 MED ORDER — SENNOSIDES-DOCUSATE SODIUM 8.6-50 MG PO TABS: 1.0000 | ORAL_TABLET | Freq: Every evening | ORAL | Status: DC | PRN

## 2016-07-17 MED ORDER — ONDANSETRON HCL 4 MG/2ML IJ SOLN
4.0000 mg | Freq: Four times a day (QID) | INTRAMUSCULAR | Status: DC | PRN
  Administered 2016-07-19 – 2016-07-25 (×2): 4 mg via INTRAVENOUS
  Filled 2016-07-17: qty 2

## 2016-07-17 MED ORDER — ROCURONIUM BROMIDE 50 MG/5ML IV SOSY
PREFILLED_SYRINGE | INTRAVENOUS | Status: AC
  Filled 2016-07-17: qty 10

## 2016-07-17 MED ORDER — VARENICLINE TARTRATE 1 MG PO TABS
1.0000 mg | ORAL_TABLET | Freq: Two times a day (BID) | ORAL | Status: DC
  Administered 2016-07-17 – 2016-07-26 (×18): 1 mg via ORAL
  Filled 2016-07-17 (×19): qty 1

## 2016-07-17 MED ORDER — FENTANYL CITRATE (PF) 100 MCG/2ML IJ SOLN
INTRAMUSCULAR | Status: AC
  Filled 2016-07-17: qty 4

## 2016-07-17 MED ORDER — NEOSTIGMINE METHYLSULFATE 10 MG/10ML IV SOLN
INTRAVENOUS | Status: DC | PRN
  Administered 2016-07-17: 3 mg via INTRAVENOUS

## 2016-07-17 MED ORDER — CHLORHEXIDINE GLUCONATE 4 % EX LIQD: 60.0000 mL | Freq: Once | CUTANEOUS | Status: DC

## 2016-07-17 MED ORDER — SODIUM CHLORIDE 0.9 % IV SOLN
INTRAVENOUS | Status: DC
  Administered 2016-07-17 – 2016-07-18 (×3): via INTRAVENOUS

## 2016-07-17 MED ORDER — PHENOL 1.4 % MT LIQD: 1.0000 | OROMUCOSAL | Status: DC | PRN

## 2016-07-17 MED ORDER — ARTIFICIAL TEARS OP OINT
TOPICAL_OINTMENT | OPHTHALMIC | Status: DC | PRN
  Administered 2016-07-17: 1 via OPHTHALMIC

## 2016-07-17 MED ORDER — LIDOCAINE 2% (20 MG/ML) 5 ML SYRINGE
INTRAMUSCULAR | Status: AC
  Filled 2016-07-17: qty 10

## 2016-07-17 MED ORDER — MIDAZOLAM HCL 5 MG/5ML IJ SOLN
INTRAMUSCULAR | Status: DC | PRN
  Administered 2016-07-17: 2 mg via INTRAVENOUS

## 2016-07-17 MED ORDER — HEPARIN (PORCINE) IN NACL 100-0.45 UNIT/ML-% IJ SOLN
1300.0000 [IU]/h | INTRAMUSCULAR | Status: DC
  Administered 2016-07-17: 500 [IU]/h via INTRAVENOUS
  Administered 2016-07-18: 1300 [IU]/h via INTRAVENOUS
  Filled 2016-07-17 (×2): qty 250

## 2016-07-17 MED ORDER — SODIUM CHLORIDE 0.9 % IV SOLN
INTRAVENOUS | Status: DC | PRN
  Administered 2016-07-17: 11:00:00

## 2016-07-17 MED ORDER — ONDANSETRON HCL 4 MG/2ML IJ SOLN
INTRAMUSCULAR | Status: AC
  Filled 2016-07-17: qty 2

## 2016-07-17 MED ORDER — ACETAMINOPHEN 650 MG RE SUPP: 325.0000 mg | RECTAL | Status: DC | PRN

## 2016-07-17 MED ORDER — DEXTROSE 5 % IV SOLN
1.5000 g | Freq: Two times a day (BID) | INTRAVENOUS | Status: AC
  Administered 2016-07-17 – 2016-07-18 (×2): 1.5 g via INTRAVENOUS
  Filled 2016-07-17 (×2): qty 1.5

## 2016-07-17 MED ORDER — MUPIROCIN 2 % EX OINT
1.0000 "application " | TOPICAL_OINTMENT | Freq: Two times a day (BID) | CUTANEOUS | Status: DC
  Administered 2016-07-17 – 2016-07-23 (×4): 1 via TOPICAL
  Filled 2016-07-17 (×2): qty 22

## 2016-07-17 MED ORDER — SUCRALFATE 1 G PO TABS
1.0000 g | ORAL_TABLET | Freq: Three times a day (TID) | ORAL | Status: DC
  Administered 2016-07-17 – 2016-07-26 (×32): 1 g via ORAL
  Filled 2016-07-17 (×35): qty 1

## 2016-07-17 MED ORDER — DULOXETINE HCL 30 MG PO CPEP
30.0000 mg | ORAL_CAPSULE | Freq: Every day | ORAL | Status: DC
  Administered 2016-07-23: 30 mg via ORAL
  Filled 2016-07-17 (×8): qty 1

## 2016-07-17 MED ORDER — HYDROMORPHONE HCL 1 MG/ML IJ SOLN
INTRAMUSCULAR | Status: AC
  Filled 2016-07-17: qty 1

## 2016-07-17 MED ORDER — SODIUM CHLORIDE 0.9 % IV SOLN
500.0000 mL | Freq: Once | INTRAVENOUS | Status: AC | PRN
  Administered 2016-07-25: 11:00:00 via INTRAVENOUS

## 2016-07-17 MED ORDER — ALUM & MAG HYDROXIDE-SIMETH 200-200-20 MG/5ML PO SUSP: 15.0000 mL | ORAL | Status: DC | PRN

## 2016-07-17 MED ORDER — PROMETHAZINE HCL 25 MG/ML IJ SOLN: 6.2500 mg | INTRAMUSCULAR | Status: DC | PRN

## 2016-07-17 MED ORDER — ACETAMINOPHEN 325 MG PO TABS: 325.0000 mg | ORAL_TABLET | ORAL | Status: DC | PRN

## 2016-07-17 MED ORDER — MORPHINE SULFATE (PF) 2 MG/ML IV SOLN
2.0000 mg | INTRAVENOUS | Status: DC | PRN
  Administered 2016-07-17: 2 mg via INTRAVENOUS
  Administered 2016-07-17 (×2): 4 mg via INTRAVENOUS
  Administered 2016-07-17: 2 mg via INTRAVENOUS
  Administered 2016-07-18: 4 mg via INTRAVENOUS
  Administered 2016-07-18: 2 mg via INTRAVENOUS
  Administered 2016-07-18: 4 mg via INTRAVENOUS
  Administered 2016-07-18 – 2016-07-19 (×2): 2 mg via INTRAVENOUS
  Administered 2016-07-20: 4 mg via INTRAVENOUS
  Administered 2016-07-20: 2 mg via INTRAVENOUS
  Administered 2016-07-21 – 2016-07-26 (×18): 4 mg via INTRAVENOUS
  Filled 2016-07-17 (×2): qty 2
  Filled 2016-07-17 (×2): qty 1
  Filled 2016-07-17: qty 2
  Filled 2016-07-17: qty 1
  Filled 2016-07-17 (×7): qty 2
  Filled 2016-07-17: qty 1
  Filled 2016-07-17 (×5): qty 2
  Filled 2016-07-17: qty 1
  Filled 2016-07-17 (×3): qty 2
  Filled 2016-07-17 (×2): qty 1
  Filled 2016-07-17 (×2): qty 2
  Filled 2016-07-17: qty 1
  Filled 2016-07-17 (×2): qty 2

## 2016-07-17 MED ORDER — POTASSIUM CHLORIDE CRYS ER 20 MEQ PO TBCR: 20.0000 meq | EXTENDED_RELEASE_TABLET | Freq: Every day | ORAL | Status: DC | PRN

## 2016-07-17 MED ORDER — HEPARIN SODIUM (PORCINE) 1000 UNIT/ML IJ SOLN
INTRAMUSCULAR | Status: AC
  Filled 2016-07-17: qty 1

## 2016-07-17 MED ORDER — HYDROMORPHONE HCL 1 MG/ML IJ SOLN
INTRAMUSCULAR | Status: AC
  Filled 2016-07-17: qty 0.5

## 2016-07-17 MED ORDER — HYDRALAZINE HCL 20 MG/ML IJ SOLN: 5.0000 mg | INTRAMUSCULAR | Status: DC | PRN

## 2016-07-17 SURGICAL SUPPLY — 63 items
BALLN MUSTANG 8.0X40 75 (BALLOONS) ×3
BALLOON MUSTANG 8.0X40 75 (BALLOONS) ×2 IMPLANT
BIOPATCH RED 1 DISK 7.0 (GAUZE/BANDAGES/DRESSINGS) ×3 IMPLANT
CANISTER SUCT 3000ML PPV (MISCELLANEOUS) ×3 IMPLANT
CATH EMB 3FR 80CM (CATHETERS) ×6 IMPLANT
CATH EMB 4FR 80CM (CATHETERS) ×3 IMPLANT
CATH OMNI FLUSH .035X70CM (CATHETERS) ×3 IMPLANT
CLIP TI MEDIUM 24 (CLIP) ×3 IMPLANT
CLIP TI WIDE RED SMALL 24 (CLIP) ×3 IMPLANT
CONT SPECI 4OZ STER CLIK (MISCELLANEOUS) ×3 IMPLANT
COVER PROBE W GEL 5X96 (DRAPES) ×3 IMPLANT
DERMABOND ADVANCED (GAUZE/BANDAGES/DRESSINGS) ×1
DERMABOND ADVANCED .7 DNX12 (GAUZE/BANDAGES/DRESSINGS) ×2 IMPLANT
DEVICE INFLATION ENCORE 26 (MISCELLANEOUS) ×3 IMPLANT
DRAIN CHANNEL 15F RND FF W/TCR (WOUND CARE) ×3 IMPLANT
DRAIN HEMOVAC 1/8 X 5 (WOUND CARE) IMPLANT
DRAPE C-ARM 42X72 X-RAY (DRAPES) ×3 IMPLANT
ELECT REM PT RETURN 9FT ADLT (ELECTROSURGICAL) ×3
ELECTRODE REM PT RTRN 9FT ADLT (ELECTROSURGICAL) ×2 IMPLANT
EVACUATOR SILICONE 100CC (DRAIN) ×3 IMPLANT
GAUZE SPONGE 2X2 8PLY STRL LF (GAUZE/BANDAGES/DRESSINGS) ×2 IMPLANT
GLOVE BIO SURGEON STRL SZ 6.5 (GLOVE) ×12 IMPLANT
GLOVE BIO SURGEON STRL SZ7 (GLOVE) ×9 IMPLANT
GLOVE BIOGEL PI IND STRL 6.5 (GLOVE) ×6 IMPLANT
GLOVE BIOGEL PI IND STRL 7.0 (GLOVE) ×6 IMPLANT
GLOVE BIOGEL PI IND STRL 7.5 (GLOVE) ×6 IMPLANT
GLOVE BIOGEL PI INDICATOR 6.5 (GLOVE) ×3
GLOVE BIOGEL PI INDICATOR 7.0 (GLOVE) ×3
GLOVE BIOGEL PI INDICATOR 7.5 (GLOVE) ×3
GLOVE ECLIPSE 6.5 STRL STRAW (GLOVE) ×3 IMPLANT
GLOVE ECLIPSE 7.0 STRL STRAW (GLOVE) ×3 IMPLANT
GOWN STRL REUS W/ TWL LRG LVL3 (GOWN DISPOSABLE) ×16 IMPLANT
GOWN STRL REUS W/TWL LRG LVL3 (GOWN DISPOSABLE) ×8
HEMOSTAT SPONGE AVITENE ULTRA (HEMOSTASIS) ×3 IMPLANT
KIT BASIN OR (CUSTOM PROCEDURE TRAY) ×3 IMPLANT
KIT ROOM TURNOVER OR (KITS) ×3 IMPLANT
NEEDLE PERC 18GX7CM (NEEDLE) ×3 IMPLANT
NS IRRIG 1000ML POUR BTL (IV SOLUTION) ×6 IMPLANT
PACK PERIPHERAL VASCULAR (CUSTOM PROCEDURE TRAY) ×3 IMPLANT
PAD ARMBOARD 7.5X6 YLW CONV (MISCELLANEOUS) ×6 IMPLANT
PATCH VASC XENOSURE 1CMX6CM (Vascular Products) ×1 IMPLANT
PATCH VASC XENOSURE 1X6 (Vascular Products) ×2 IMPLANT
SET COLLECT BLD 21X3/4 12 PB (MISCELLANEOUS) ×3 IMPLANT
SHEATH AVANTI 11CM 5FR (MISCELLANEOUS) ×3 IMPLANT
SHEATH PINNACLE R/O II 6F 4CM (SHEATH) ×3 IMPLANT
SPONGE GAUZE 2X2 STER 10/PKG (GAUZE/BANDAGES/DRESSINGS) ×1
STAPLER VISISTAT 35W (STAPLE) IMPLANT
SUT ETHILON 3 0 PS 1 (SUTURE) ×6 IMPLANT
SUT MNCRL AB 4-0 PS2 18 (SUTURE) ×6 IMPLANT
SUT PROLENE 5 0 C 1 24 (SUTURE) ×3 IMPLANT
SUT PROLENE 6 0 BV (SUTURE) ×9 IMPLANT
SUT VIC AB 2-0 CT1 27 (SUTURE) ×1
SUT VIC AB 2-0 CT1 TAPERPNT 27 (SUTURE) ×2 IMPLANT
SUT VIC AB 3-0 SH 27 (SUTURE) ×1
SUT VIC AB 3-0 SH 27X BRD (SUTURE) ×2 IMPLANT
SYRINGE 1CC SLIP TB (MISCELLANEOUS) ×3 IMPLANT
SYRINGE 3CC LL L/F (MISCELLANEOUS) ×3 IMPLANT
TAPE CLOTH SURG 4X10 WHT LF (GAUZE/BANDAGES/DRESSINGS) ×3 IMPLANT
TRAY FOLEY W/METER SILVER 16FR (SET/KITS/TRAYS/PACK) ×3 IMPLANT
UNDERPAD 30X30 (UNDERPADS AND DIAPERS) ×3 IMPLANT
WATER STERILE IRR 1000ML POUR (IV SOLUTION) ×3 IMPLANT
WIRE BENTSON .035X145CM (WIRE) ×3 IMPLANT
WIRE MINI STICK MAX (SHEATH) ×3 IMPLANT

## 2016-07-17 NOTE — Op Note (Addendum)
OPERATIVE NOTE   PROCEDURE: 1. Right common femoral artery exposure and bovine patch angioplasty 2. Open cannulation of common femoral artery 3. Aortogram 4. Thrombectomy of right common iliac artery  5. Angioplasty right common iliac artery   PRE-OPERATIVE DIAGNOSIS: Right leg lifestyle limiting intermittent claudication   POST-OPERATIVE DIAGNOSIS: same as above   SURGEON: [name], MD  ASSISTANT(S): Gerri Lins, PAC, Silva Bandy, [facility]   ANESTHESIA: general  ESTIMATED BLOOD LOSS: 200 cc  FINDING(S): 1.  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  Patent right common iliac artery stent after thrombectomy 3.  Patent distal aorta bilateral iliac arterial system 4.  Palpable right posterior tibial artery and anterior tibial artery at end of case  SPECIMEN(S):  Thrombus from right common iliac artery   INDICATIONS:   [name] is a 51 y.o. male who presents with lifestyle limiting intermittent claudication involving right leg.  He had previously undergone endovascular recannulation of chronically occluded right common iliac artery with a common iliac artery stent.  Unfortunately, this stent occluded slightly after one month after placement.  He underwent cardiac work-up for a femorofemoral bypass.  In this process, the patient was found to have a thrombophilia with antiphospholipid syndrome.  We discussed trying to complete a thrombectomy of the right common iliac artery and completing the femorofemoral bypass if this fails.  The risk, benefits, and alternative for bypass operations were discussed with the patient.  The patient is aware the risks include but are not limited to: bleeding, infection, myocardial infarction, stroke, limb loss, nerve damage, need for additional procedures in the future, wound complications, and inability to complete the bypass. The patient is aware of these risks and agreed to proceed.  DESCRIPTION: After obtaining  full informed written consent, the patient was brought back to the operating room and placed supine upon the operating table.  The patient received IV antibiotics prior to induction.  A procedure time out was completed and the correct surgical site was verified.  After obtaining adequate anesthesia, the patient was prepped and draped in the standard fashion for: femorofemoral bypass.  I identified the right common femoral artery under Sonosite guidance.  I made an incision over the right common femoral artery.  I dissected down to the common femoral artery with electrocautery and blunt dissection.  I placed vessel loops on all circumflex branches.  I could visualize the femoral bifurcation further distally but did not feel I needed control of such given the long common femoral artery segment he had.  I dissected proximally to the distal external iliac artery.    The patient was given 8500 units of Heparin intravenously, which was a therapeutic bolus.  I also started 500 U/Hr of heparin drip to maintain anticoagulation in this patient with antiphospholipid syndrome.  I clamped the common femoral artery proximally and distally.  I made an arteriotomy with a 11-blade and extended this proximal and distally.  I performed a thrombectomy with 3 and 4 Fogarty balloons.  This was initially difficult likely due to inability to steer through the prior stent, but eventually, I was able use both to extract what appeared to be chronic clot.  I had return of pulsatile bleeding at this point.  After two more passes of the 4 Fogarty without any thrombus, I felt the thrombectomy was completed.      I then obtained a bovine pericardial patch and which I fashioned for this arteriotomy.  I sewed the patch to the  arteriotomy with a running stitch of 6-0 Prolene.  Prior to completion, I backbled all vessels.  There was no thrombus present.  After releasing all clamps there was immediately a pulse in the common femoral artery.  At  this point, there was no bleeding in the suture line.    I then cannulated the common femoral artery proximal to the patched arteriotomy with a 18 gauge needle and passed a J-wire into the external iliac artery.  The needle was exchanged over the wire for a 5-Fr sheath.  I used a Bentson wire to steer through the stent into the distal aorta.  I placed Omniflush catheter over the wire into the distal aorta.  I did a hand injection which demonstrated patent distal aorta with bilateral patent iliac artery systems.  I then exchanged the catheter for a 8 mm x 40 mm angioplasty balloon.  This was inflated at 10 atm for 1 minute within the common iliac artery stent to force the stent to reappose to the arterial wall, given the manipulation with the Fogarty balloons.  I deflated the balloon and removed the balloon.  I replaced the Omniflush catheter in the distal aorta.  The completion pelvic angiogram was unchanged from the previous image which demonstrated patent distal aorta with patent bilateral iliac arterial systems.  I replaced the wire into the catheter, straightening out the crook in the catheter.  Both were removed from the sheath together.  At this point, I clamped the common femoral artery distal to the sheath and then proximally, while pulling out the sheath.  I repeated the arteriotomy with a 6-0 Prolene.  I washed out the right groin and there was no active bleeding and minimal ooze.  Due to this patient's antiphospholipid syndrome, he will require continuous anticoagulation, so I elected to place a drain as a precaution.  I obtained a 10 JP drain.  I made a stab incision and delivered the drain.  After trying to manipulate this drain, I felt the geometry was not sufficient for draining this artery.  I removed the drain and then obtained a [place].  I routed the trocar of this drain more laterally and then anchored the drain to the skin with 5-0 Nylon.  I then shortened the drain and placed it adjacent  to the common femoral artery.  I repaired the subcutaneous tissue with a double layer of 2-0 Vicryl and a double layer of 3-0 Vicryl.  The skin was cleaned, dried, and reinforced with Dermabond.    I checked distally and there were palpable posterior tibial artery and anterior tibial artery pulses that were confirmed with continuous doppler.   COMPLICATIONS: none  CONDITION: stable   [name], MD, [facility] [place] Office: [phone] Pager: [phone]  07/17/2016, 1:09 PM

## 2016-07-17 NOTE — Progress Notes (Signed)
ANTICOAGULATION CONSULT NOTE - Initial Consult  Pharmacy Consult for heparin Indication: PAD  Allergies  Allergen Reactions  . No Known Allergies     Patient Measurements: Weight: 186 lb (84.4 kg)  Vital Signs: Temp: 97.5 F (36.4 C) (02/26 1320) Temp Source: Oral (02/26 0726) BP: 122/82 (02/26 1421) Pulse Rate: 67 (02/26 1430)  Labs:  Recent Labs  07/17/16 0806 07/17/16 1322  HGB 11.9*  --   HCT 35.0*  --   APTT  --  >200*  CREATININE 0.80  --     Estimated Creatinine Clearance: 113.6 mL/min (by C-G formula based on SCr of 0.8 mg/dL).   Medical History: Past Medical History:  Diagnosis Date  . Anemia   . Anxiety   . Blood dyscrasia    clotting  disorder  . Dyspnea    w/ exertion    hx pleuralsy yrs ago  . GERD (gastroesophageal reflux disease)   . Hyperlipidemia   . PAD (peripheral artery disease) ([place])   . Smoker     Medications:  Infusions:  . sodium chloride    . heparin 500 Units/hr (07/17/16 1145)  . lactated ringers 50 mL/hr (07/17/16 [id number])    Assessment: 51 y/o male with PAD and suspected antiphospholipid antibody syndrome on Lovenox PTA. He is s/p right common femoral artery patch angioplasty and angioplasty of right common iliac artery. Heparin was started in the PACU at 500 units/hr at 11:45. Also received 8500 units of heparin intra-op at 11:10 which would explain aPTT >200 at 13:22. Pharmacy consulted to manage heparin drip. No overt bleeding noted, Hgb 11.9 and platelets were 249 on 2/23.  Goal of Therapy:  Heparin level 0.3-0.7 units/ml Monitor platelets by anticoagulation protocol: Yes   Plan:  - Increase heparin drip to 1300 units/hr with no bolus - 6 hr heparin level - Daily heparin level and CBC - Monitor for s/sx of bleeding - Follow-up transition back to Lovenox as appropriate  Renold Genta, PharmD, BCPS Clinical Pharmacist Phone for today - [place] - [phone] 07/17/2016 2:58 PM

## 2016-07-17 NOTE — Care Management Note (Addendum)
Case Management Note  Patient Details  Name: Kirby Sarker MRN: [id number] Date of Birth: [birth year]  Subjective/Objective:   S/p right femoral arter angioplasty and right iliac artery angioplasty.  Await pt eval.  Patient lives with wife, pta indep, he has pcp, he has medication coverage, he has transportation at [employer].  He is for ABI's today.  Per pt/ot eval, no f/u needed.  NCM will cont to follow for dc needs.                Action/Plan:  DC to home today, no CM needs identified.  Expected Discharge Date:                  Expected Discharge Plan:     In-House Referral:     Discharge planning Services  CM Consult  Post Acute Care Choice:    Choice offered to:     DME Arranged:    DME Agency:     HH Arranged:    HH Agency:     Status of Service:  In process, will continue to follow  If discussed at Long Length of Stay Meetings, dates discussed:    Additional Comments:  Zenon Mayo, RN 07/17/2016, 7:16 PM

## 2016-07-17 NOTE — Transfer of Care (Signed)
Immediate Anesthesia Transfer of Care Note  Patient: [name]  Procedure(s) Performed: Procedure(s): THROMBECTOMY RIGHT FEMORAL ARTERY (Right) PATCH ANGIOPLASTY RIGHT FEMORAL ARTERY USING XENOSURE BIOLOGIC PATCH (Right) INTRA OPERATIVE ANGIOGRAM OF RIGHT COMMON ILIAC ARTERY (Right) ANGIOPLASTY RIGHT COMMON ILIAC ARTERY (Right)  Patient Location: PACU  Anesthesia Type:General  Level of Consciousness: awake, alert , oriented and sedated  Airway & Oxygen Therapy: Patient Spontanous Breathing and Patient connected to nasal cannula oxygen  Post-op Assessment: Report given to RN, Post -op Vital signs reviewed and stable and Patient moving all extremities  Post vital signs: Reviewed and stable  Last Vitals:  Vitals:   07/17/16 0726  BP: 133/75  Pulse: 64  Resp: 18  Temp: 36.8 C    Last Pain:  Vitals:   07/17/16 0806  TempSrc:   PainSc: 0-No pain      Patients Stated Pain Goal: 3 ([id number])  Complications: No apparent anesthesia complications

## 2016-07-17 NOTE — Anesthesia Preprocedure Evaluation (Signed)
Anesthesia Evaluation  Patient identified by MRN, date of birth, ID band Patient awake    Reviewed: Allergy & Precautions, NPO status , Patient's Chart, lab work & pertinent test results  Airway Mallampati: II  TM Distance: >3 FB Neck ROM: Full    Dental  (+) Dental Advisory Given   Pulmonary Current Smoker,    breath sounds clear to auscultation       Cardiovascular + Peripheral Vascular Disease   Rhythm:Regular Rate:Normal     Neuro/Psych Anxiety Depression  Neuromuscular disease    GI/Hepatic Neg liver ROS, GERD  ,  Endo/Other  negative endocrine ROS  Renal/GU negative Renal ROS     Musculoskeletal   Abdominal   Peds  Hematology  (+) Blood dyscrasia, anemia ,   Anesthesia Other Findings   Reproductive/Obstetrics                             Lab Results  Component Value Date   WBC 12.4 (H) 07/14/2016   HGB 11.9 (L) 07/17/2016   HCT 35.0 (L) 07/17/2016   MCV 74.2 (L) 07/14/2016   PLT 249 07/14/2016   Lab Results  Component Value Date   CREATININE 0.80 07/17/2016   BUN 8 07/17/2016   NA 140 07/17/2016   K 4.1 07/17/2016   CL 106 07/17/2016   CO2 25 07/14/2016    Anesthesia Physical Anesthesia Plan  ASA: III  Anesthesia Plan: General   Post-op Pain Management:    Induction: Intravenous  Airway Management Planned: Oral ETT  Additional Equipment:   Intra-op Plan:   Post-operative Plan: Extubation in OR  Informed Consent: I have reviewed the patients History and Physical, chart, labs and discussed the procedure including the risks, benefits and alternatives for the proposed anesthesia with the patient or authorized representative who has indicated his/her understanding and acceptance.   Dental advisory given  Plan Discussed with: CRNA  Anesthesia Plan Comments:         Anesthesia Quick Evaluation

## 2016-07-17 NOTE — Progress Notes (Signed)
[place] for heparin Indication: PAD  Allergies  Allergen Reactions  . No Known Allergies     Patient Measurements: Weight: 186 lb (84.4 kg)  Vital Signs: Temp: 97.8 F (36.6 C) (02/26 1556) Temp Source: Oral (02/26 1556) BP: 126/80 (02/26 1556) Pulse Rate: 63 (02/26 1556)  Labs:  Recent Labs  07/17/16 0806 07/17/16 1322 07/17/16 2137  HGB 11.9*  --   --   HCT 35.0*  --   --   APTT  --  >200*  --   HEPARINUNFRC  --   --  0.51  CREATININE 0.80  --   --     Estimated Creatinine Clearance: 113.6 mL/min (by C-G formula based on SCr of 0.8 mg/dL).   Medications:  Infusions:  . sodium chloride 125 mL/hr at 07/17/16 1532  . heparin 1,300 Units/hr (07/17/16 1528)  . lactated ringers 50 mL/hr (07/17/16 [id number])    Assessment: 51 y/o male with PAD and suspected antiphospholipid antibody syndrome on Lovenox PTA. He is s/p right common femoral artery patch angioplasty and angioplasty of right common iliac artery. Pharmacy consulted to manage heparin drip.   Heparin level is therapeutic at 0.51. No bleeding noted.  Goal of Therapy:  Heparin level 0.3-0.7 units/ml Monitor platelets by anticoagulation protocol: Yes   Plan:  - Continue heparin drip at 1300 units/hr - Confirmatory heparin level with am labs - Daily heparin level and CBC - Monitor for s/sx of bleeding - Follow-up transition back to Lovenox as appropriate  Renold Genta, PharmD, BCPS Clinical Pharmacist Phone for today - [place] - [phone] 07/17/2016 10:27 PM

## 2016-07-17 NOTE — Anesthesia Postprocedure Evaluation (Signed)
Anesthesia Post Note  Patient: Keyler Grigsby  Procedure(s) Performed: Procedure(s) (LRB): THROMBECTOMY RIGHT FEMORAL ARTERY (Right) PATCH ANGIOPLASTY RIGHT FEMORAL ARTERY USING XENOSURE BIOLOGIC PATCH (Right) INTRA OPERATIVE ANGIOGRAM OF RIGHT COMMON ILIAC ARTERY (Right) ANGIOPLASTY RIGHT COMMON ILIAC ARTERY (Right)  Patient location during evaluation: PACU Anesthesia Type: General Level of consciousness: awake and alert Pain management: pain level controlled Vital Signs Assessment: post-procedure vital signs reviewed and stable Respiratory status: spontaneous breathing, nonlabored ventilation, respiratory function stable and patient connected to nasal cannula oxygen Cardiovascular status: blood pressure returned to baseline and stable Postop Assessment: no signs of nausea or vomiting Anesthetic complications: no       Last Vitals:  Vitals:   07/17/16 1451 07/17/16 1500  BP: 125/74   Pulse: 64 60  Resp: 14 (!) 9  Temp:      Last Pain:  Vitals:   07/17/16 1450  TempSrc:   PainSc: 5                  Tiajuana Amass

## 2016-07-17 NOTE — Progress Notes (Signed)
  [facility] Day [facility] Note  Subjective:  Patient seen in PACU.  Vitals:   07/17/16 1421 07/17/16 1430  BP: 122/82   Pulse: 74 67  Resp: 17 13  Temp:      Right groin without hematoma. Drain in place. Doppler flow right AT and PT.   Assessment/Plan:  This is a 51 y.o. male who is s/p  1. Right common femoral artery exposure and bovine patch angioplasty 2. Open cannulation of common femoral artery 3. Aortogram 4. Angioplasty right common iliac artery   Stable post-op.  Heparin per pharmacy to start in PACU.  To 4E when bed available.   Virgina Jock, [place] Pager: [phone] 07/17/2016 2:42 PM

## 2016-07-17 NOTE — Interval H&P Note (Signed)
History and Physical Interval Note:  07/17/2016 7:16 AM  [name]  has presented today for surgery, with the diagnosis of Peripheral Vascular Disease I70.229  The various methods of treatment have been discussed with the patient and family. After consideration of risks, benefits and other options for treatment, the patient has consented to  Procedure(s): BYPASS GRAFT FEMORAL-FEMORAL ARTERY- LEFT TO RIGHT (N/A) as a surgical intervention .  The patient's history has been reviewed, patient examined, no change in status, stable for surgery.  I have reviewed the patient's chart and labs.  Questions were answered to the patient's satisfaction.     [name]

## 2016-07-17 NOTE — H&P (View-Only) (Signed)
Established Intermittent Claudication  History of Present Illness  [name] is a 51 y.o. (Feb 01, 1966) male who presents with chief complaint: short distance intermittent claudication.  The patient's symptoms have not progressed.  The patient's symptoms are: short distance intermittent claudication.  The patient had undergone R CIA stenting of a CTO on 02/24/16 but the stent occluded on subsequent angiogram on 05/11/16.  I recommended we proceed with fem-fem bypass to alievate his R common iliac artery occlusion. 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  The patient's treatment regimen currently included: maximal medical management.  Dr. Debara Pickett has been seeing this patient for pre-op clearance.  This patient is low risk at this point.  He returns for preoperative counseling.   No past medical history on file.  Past Surgical History:  Procedure Laterality Date  . KNEE SURGERY    . neck surger    . NECK SURGERY    . PERIPHERAL VASCULAR CATHETERIZATION N/A 02/24/2016   Procedure: Abdominal Aortogram w/Lower Extremity;  Surgeon: Conrad Dormont, MD;  Location: [facility];  Service: Cardiovascular;  Laterality: N/A;  . PERIPHERAL VASCULAR CATHETERIZATION Right 02/24/2016   Procedure: Peripheral Vascular Intervention;  Surgeon: Conrad Ohlman, MD;  Location: [facility];  Service: Cardiovascular;  Laterality: Right;  Common iliac  . PERIPHERAL VASCULAR CATHETERIZATION N/A 05/11/2016   Procedure: Abdominal Aortogram;  Surgeon: Conrad [place], MD;  Location: [facility];  Service: Cardiovascular;  Laterality: N/A;  . PERIPHERAL VASCULAR CATHETERIZATION N/A 05/11/2016   Procedure: Lower Extremity Angiography;  Surgeon: Conrad Graettinger, MD;  Location: [facility];  Service: Cardiovascular;  Laterality: N/A;    Social History   Social History  . Marital status: Married    Spouse name: N/A  .  Number of children: N/A  . Years of education: N/A   Occupational History  . Not on file.   Social History Main Topics  . Smoking status: Current Every Day Smoker    Types: E-cigarettes  . Smokeless tobacco: Never Used     Comment: 1/2 pk per day.  Vaporizor has 0% nicotine.   . Alcohol use Not on file  . Drug use: Unknown  . Sexual activity: Not on file   Other Topics Concern  . Not on file   Social History Narrative  . No narrative on file    Family History  Problem Relation Age of Onset  . Cancer Father     Current Outpatient Prescriptions  Medication Sig Dispense Refill  . clopidogrel (PLAVIX) 75 MG tablet Take 1 tablet (75 mg total) by mouth daily. 30 tablet 11  . esomeprazole (NEXIUM) 20 MG capsule Take 20 mg by mouth daily at 12 noon.    . gabapentin (NEURONTIN) 100 MG capsule Take 2 capsules (200 mg total) by mouth 3 (three) times daily. (Patient taking differently: Take 200 mg by mouth at bedtime. ) 180 capsule 3  . ibuprofen (ADVIL,MOTRIN) 200 MG tablet Take 400 mg by mouth every 6 (six) hours as needed for headache.    . ranitidine (ZANTAC) 150 MG tablet Take 150 mg by mouth daily as needed for heartburn.    . varenicline (CHANTIX CONTINUING MONTH PAK) 1 MG tablet Take 1 tablet (1 mg total) by mouth 2 (two) times daily. 60 tablet 1  . varenicline (CHANTIX STARTING MONTH PAK) 0.5 MG X 11 & 1 MG X  42 tablet Take as directed per package instructions. 53 tablet 0   No current facility-administered medications for this visit.      No Known Allergies   REVIEW OF SYSTEMS:  (Positives checked otherwise negative)  CARDIOVASCULAR:   [ ]  chest pain,  [ ]  chest pressure,  [ ]  palpitations,  [ ]  shortness of breath when laying flat,  [ ]  shortness of breath with exertion,   [x]  pain in feet when walking,  [ ]  pain in feet when laying flat, [ ]  history of blood clot in veins (DVT),  [ ]  history of phlebitis,  [ ]  swelling in legs,  [ ]  varicose veins  PULMONARY:    [ ]  productive cough,  [ ]  asthma,  [ ]  wheezing  NEUROLOGIC:   [ ]  weakness in arms or legs,  [ ]  numbness in arms or legs,  [ ]  difficulty speaking or slurred speech,  [ ]  temporary loss of vision in one eye,  [ ]  dizziness  HEMATOLOGIC:   [ ]  bleeding problems,  [ ]  problems with blood clotting too easily  MUSCULOSKEL:   [ ]  joint pain, [ ]  joint swelling  GASTROINTEST:   [ ]  vomiting blood,  [ ]  blood in stool     GENITOURINARY:   [ ]  burning with urination,  [ ]  blood in urine  PSYCHIATRIC:   [ ]  history of major depression  INTEGUMENTARY:   [ ]  rashes,  [ ]  ulcers  CONSTITUTIONAL:   [ ]  fever,  [ ]  chills     Physical Examination  Vitals:   06/23/16 1557  BP: 116/72  Pulse: 70  Resp: 16  Temp: 97.2 F (36.2 C)  TempSrc: Oral  SpO2: 100%  Weight: 191 lb (86.6 kg)  Height: 5\' 9"  (1.753 m)   Body mass index is 28.21 kg/m.  General: Alert, O x 3, WD,NAD  Pulmonary: Sym exp, good B air movt,CTA B  Cardiac: RRR, Nl S1, S2, no Murmurs, No rubs, No S3,S4  Vascular: Vessel Right Left  Radial Palpable Palpable  Brachial Palpable Palpable  Carotid Palpable, No Bruit Palpable, No Bruit  Aorta Not palpable N/A  Femoral Faintly palpable Palpable  Popliteal Not palpable Not palpable  PT Not palpable Palpable  DP Not palpable Palpable   Gastrointestinal: soft, non-distended, non-tender to palpation, No guarding or rebound, no HSM, no masses, no CVAT B, No palpable prominent aortic pulse,    Musculoskeletal: M/S 5/5 throughout  , Extremities without ischemic changes , No edema present, R knee brace in place  Neurologic: CN 2-12 intact , Pain and light touch intact in extremities , Motor exam as listed above   Medical Decision Making  [name] is a 51 y.o. male who presents with:  right leg intermittent claudication without evidence of critical limb ischemia.   It remains unclear the etiology of the R CIA stent occlusion.  During the  [place] of the R CIA CTO, I discovered evidence of prior possible dissection, so I wonder if there remained a dissection flap that was not evident on completion angiogram that caused the reocclusion.  Based on the patient's vascular studies and examination, I have offered the patient: Left to right femorofemoral bypass The risk, benefits, and alternative for bypass operations were discussed with the patient.   The patient is aware the risks include but are not limited to: bleeding, infection, myocardial infarction, stroke, limb loss, nerve damage, need for additional procedures in the future, wound complications,  and inability to complete the bypass.  The patient is aware of these risks and agreed to proceed.  I discussed in depth with the patient the nature of atherosclerosis, and emphasized the importance of maximal medical management including strict control of blood pressure, blood glucose, and lipid levels, antiplatelet agents, obtaining regular exercise, and cessation of smoking.    The patient is aware that without maximal medical management the underlying atherosclerotic disease process will progress, limiting the benefit of any interventions. The patient is currently not on a statin as lipid panel pending. The patient is currently on an anti-platelet: Plavix.  Thank you for allowing [place] to participate in this patient's care.   [name], MD, FACS [facility] [place] Office: [phone] Pager: [phone]

## 2016-07-17 NOTE — Anesthesia Procedure Notes (Signed)
Procedure Name: Intubation Date/Time: 07/17/2016 10:31 AM Performed by: Scheryl Darter Pre-anesthesia Checklist: Patient identified, Emergency Drugs available, Suction available and Patient being monitored Patient Re-evaluated:Patient Re-evaluated prior to inductionOxygen Delivery Method: Circle System Utilized Preoxygenation: Pre-oxygenation with 100% oxygen Intubation Type: IV induction Ventilation: Mask ventilation without difficulty Tube type: Oral Tube size: 8.0 mm Number of attempts: 1 Airway Equipment and Method: Stylet and Oral airway Placement Confirmation: ETT inserted through vocal cords under direct vision,  positive ETCO2 and breath sounds checked- equal and bilateral Secured at: 23 cm Tube secured with: Tape Dental Injury: Teeth and Oropharynx as per pre-operative assessment

## 2016-07-18 ENCOUNTER — Inpatient Hospital Stay (HOSPITAL_COMMUNITY): Payer: 59

## 2016-07-18 ENCOUNTER — Encounter (HOSPITAL_COMMUNITY): Payer: Self-pay | Admitting: Vascular Surgery

## 2016-07-18 ENCOUNTER — Encounter (HOSPITAL_COMMUNITY): Payer: 59

## 2016-07-18 DIAGNOSIS — I739 Peripheral vascular disease, unspecified: Secondary | ICD-10-CM

## 2016-07-18 LAB — CBC
HCT: 34.7 % — ABNORMAL LOW (ref 39.0–52.0)
HCT: 35 % — ABNORMAL LOW (ref 39.0–52.0)
HEMOGLOBIN: 10.1 g/dL — AB (ref 13.0–17.0)
HEMOGLOBIN: 10 g/dL — AB (ref 13.0–17.0)
MCH: 22.4 pg — AB (ref 26.0–34.0)
MCH: 22.4 pg — ABNORMAL LOW (ref 26.0–34.0)
MCHC: 29.1 g/dL — AB (ref 30.0–36.0)
MCHC: 28.6 g/dL — AB (ref 30.0–36.0)
MCV: 77.1 fL — ABNORMAL LOW (ref 78.0–100.0)
MCV: 78.5 fL (ref 78.0–100.0)
Platelets: 206 10*3/uL (ref 150–400)
Platelets: 212 10*3/uL (ref 150–400)
RBC: 4.5 MIL/uL (ref 4.22–5.81)
RBC: 4.46 MIL/uL (ref 4.22–5.81)
RDW: 28.3 % — AB (ref 11.5–15.5)
RDW: 28.7 % — ABNORMAL HIGH (ref 11.5–15.5)
WBC: 15.2 10*3/uL — ABNORMAL HIGH (ref 4.0–10.5)
WBC: 11.7 10*3/uL — ABNORMAL HIGH (ref 4.0–10.5)

## 2016-07-18 LAB — BASIC METABOLIC PANEL
Anion gap: 7 (ref 5–15)
BUN: 5 mg/dL — AB (ref 6–20)
CALCIUM: 8.6 mg/dL — AB (ref 8.9–10.3)
CHLORIDE: 104 mmol/L (ref 101–111)
CO2: 27 mmol/L (ref 22–32)
CREATININE: 0.94 mg/dL (ref 0.61–1.24)
GFR calc Af Amer: >60 mL/min (ref >60)
GFR calc non Af Amer: >60 mL/min (ref >60)
Glucose, Bld: 113 mg/dL — ABNORMAL HIGH (ref 65–99)
Potassium: 3.9 mmol/L (ref 3.5–5.1)
SODIUM: 138 mmol/L (ref 135–145)

## 2016-07-18 LAB — HEPARIN LEVEL (UNFRACTIONATED): HEPARIN UNFRACTIONATED: 0.39 [IU]/mL (ref 0.30–0.70)

## 2016-07-18 MED ORDER — WARFARIN SODIUM 5 MG PO TABS
5.0000 mg | ORAL_TABLET | Freq: Once | ORAL | Status: AC
  Administered 2016-07-18: 5 mg via ORAL
  Filled 2016-07-18: qty 1

## 2016-07-18 MED ORDER — ENOXAPARIN SODIUM 100 MG/ML ~~LOC~~ SOLN
85.0000 mg | Freq: Two times a day (BID) | SUBCUTANEOUS | Status: DC
  Administered 2016-07-18 – 2016-07-25 (×14): 85 mg via SUBCUTANEOUS
  Filled 2016-07-18 (×14): qty 1

## 2016-07-18 MED ORDER — WARFARIN - PHARMACIST DOSING INPATIENT
Freq: Every day | Status: DC
  Administered 2016-07-20 – 2016-07-23 (×3)

## 2016-07-18 NOTE — Progress Notes (Signed)
VASCULAR LAB PRELIMINARY  ARTERIAL Bilateral iliac duplex: Bilateral common iliac and external iliac arteries appear patent.     ABI completed:    RIGHT    LEFT    PRESSURE WAVEFORM  PRESSURE WAVEFORM  BRACHIAL 135 Tri BRACHIAL 131 Tri         AT 127 Bi AT 159 Tri  PT 133 Bi PT 168 Tri                  RIGHT LEFT  ABI 0.99 1.24      Landry Mellow, RDMS, RVT   07/18/2016, 10:49 AM

## 2016-07-18 NOTE — Progress Notes (Signed)
RN attempted to call report, receiving RN off the floor. Will attempt again in a few minutes.

## 2016-07-18 NOTE — Evaluation (Signed)
Physical Therapy Evaluation/ DIscharge Patient Details Name: [name] MRN: [id number] DOB: [birth year] Today's Date: 07/18/2016   History of Present Illness  51 yo admitted for right femoral angioplasty. PMHx: GERD, anxiety, PAD, clotting disorder  Clinical Impression  Pt very pleasant, moving well despite right groin pain. Pt encouraged to lay flat throughout the day, ambulate and perform seated marching to maximize strength, rOM and function. Pt able to complete all transfers and gait without physical assist and does not require further therapy. All education completed with pt aware and agreeable and states he will perform all recommended activity to maximize function and gait. Pt with altered gait due to back and groin pain with education for normalization without need for AD at this time.     Follow Up Recommendations No PT follow up    Equipment Recommendations  None recommended by PT    Recommendations for Other Services       Precautions / Restrictions Precautions Precautions: None      Mobility  Bed Mobility Overal bed mobility: Modified Independent                Transfers Overall transfer level: Modified independent                  Ambulation/Gait Ambulation/Gait assistance: Modified independent (Device/Increase time) Ambulation Distance (Feet): 500 Feet Assistive device: None Gait Pattern/deviations: Decreased stance time - right;Antalgic   Gait velocity interpretation: Below normal speed for age/gender General Gait Details: pt with decreased stance on right with tendency for trunk flexion and lateral flexion right due to pain. Pt educated for gait deficits and normalization  Stairs Stairs: Yes Stairs assistance: Modified independent (Device/Increase time) Stair Management: Step to pattern;Forwards Number of Stairs: 4 General stair comments: cues for sequence with pt able to return demonstrate  Wheelchair Mobility    Modified Rankin  (Stroke Patients Only)       Balance Overall balance assessment: No apparent balance deficits (not formally assessed)                                           Pertinent Vitals/Pain Pain Assessment: 0-10 Pain Score: 4  Pain Location: right groin Pain Descriptors / Indicators: Sore Pain Intervention(s): Limited activity within patient's tolerance;Monitored during session;Premedicated before session    Home Living Family/patient expects to be discharged to:: Private residence Living Arrangements: Spouse/significant other Available Help at Discharge: Family;Available PRN/intermittently Type of Home: Mobile home Home Access: Stairs to enter   Entrance Stairs-Number of Steps: 5 Home Layout: One level Home Equipment: None      Prior Function Level of Independence: Independent               Hand Dominance        Extremity/Trunk Assessment   Upper Extremity Assessment Upper Extremity Assessment: Overall WFL for tasks assessed    Lower Extremity Assessment Lower Extremity Assessment: RLE deficits/detail RLE Deficits / Details: WFL for strength, tentative and decreased ROm due to post op pain    Cervical / Trunk Assessment Cervical / Trunk Assessment: Normal  Communication   Communication: No difficulties  Cognition Arousal/Alertness: Awake/alert Behavior During Therapy: WFL for tasks assessed/performed Overall Cognitive Status: Within Functional Limits for tasks assessed                      General Comments  Exercises General Exercises - Lower Extremity Hip Flexion/Marching: AROM;Right;10 reps;Seated   Assessment/Plan    PT Assessment Patent does not need any further PT services  PT Problem List         PT Treatment Interventions      PT Goals (Current goals can be found in the Care Plan section)  Acute Rehab PT Goals PT Goal Formulation: All assessment and education complete, DC therapy    Frequency      Barriers to discharge        Co-evaluation               End of Session   Activity Tolerance: Patient tolerated treatment well Patient left: in chair;with call bell/phone within Christensen;with family/visitor present Nurse Communication: Mobility status PT Visit Diagnosis: Pain Pain - Right/Left: Right Pain - part of body: Hip         Time: 0800-0820 PT Time Calculation (min) (ACUTE ONLY): 20 min   Charges:   PT Evaluation $PT Eval Moderate Complexity: 1 Procedure     PT G Codes:         [name] 08/09/16, 10:21 AM  [name], [name]

## 2016-07-18 NOTE — Progress Notes (Signed)
[place] for Lovenox / Coumadin Bridge Indication: PAD / Antiphospholipid syndrome  Allergies  Allergen Reactions  . No Known Allergies     Patient Measurements: Height: 5' 9.5" (176.5 cm) Weight: 190 lb (86.2 kg) IBW/kg (Calculated) : 71.85  Vital Signs: Temp: 98.4 F (36.9 C) (02/27 1243) Temp Source: Oral (02/27 1243) BP: 130/74 (02/27 1243) Pulse Rate: 70 (02/27 1243)  Assessment: 51 y/o male with PAD and suspected antiphospholipid antibody syndrome on Lovenox PTA.Then transitioned to heparin. Last heparin level was therapeutic at 0.39. Now to transition back to a Lovenox / Coumadin bridge. Hgb stable at 10, plts wnl. No bleeding noted  Goal of Therapy:  Heparin level 0.3-0.7 units/ml Monitor platelets by anticoagulation protocol: Yes   Plan:  Stop heparin gtt  Start Lovenox 85mg  [place] Q12 (1hr after heparin gtt stopped) Give Coumadin 5mg  PO x 1 Monitor daily INR, CBC, s/s of bleed  Elenor Quinones, PharmD, BCPS Clinical Pharmacist Pager [phone] 07/18/2016 3:02 PM

## 2016-07-18 NOTE — Progress Notes (Signed)
Patient returned from vascular lab after having ABI's completed. Heparin drip noted to be turned off - patient stated it was beeping and "someone turned it off about 10 minutes ago." Heparin drip immediately restarted. Pharmacist Ovid Curd) notified and stated he would be okay with having labs drawn tomorrow morning as previously ordered. Patient informed of the above. Will continue to monitor.  Joellen Jersey, RN.

## 2016-07-18 NOTE — Progress Notes (Addendum)
[facility] [place]  Subjective  - Doing well over all. Soreness in the right groin.   Objective 113/64 61 97.9 F (36.6 C) (Oral) 15 97%  Intake/Output Summary (Last 24 hours) at 07/18/16 0703 Last data filed at 07/18/16 0556  Gross per 24 hour  Intake             3320 ml  Output             3630 ml  Net             -310 ml    JP watery SS drainage Output 30 cc Groin soft without hematoma  Fett warm well perfused sensation and active range of motion intact Doppler AT/PT without palpable pulse Heart RRR Lungs non labored breathing   Assessment/Planning: POD # 1  1. Right common femoral artery exposure and bovine patch angioplasty 2. Open cannulation of common femoral artery 3. Aortogram 4. Angioplasty right common iliac artery  Plan Leave on Heparin for now will order STAT ABI/Aortoiliac duplex.    ARTERIAL Bilateral iliac duplex: Bilateral common iliac and external iliac arteries appear patent.     ABI completed:    RIGHT    LEFT    PRESSURE WAVEFORM  PRESSURE WAVEFORM  BRACHIAL 135 Tri BRACHIAL 131 Tri         AT 127 Bi AT 159 Tri  PT 133 Bi PT 168 Tri                  RIGHT LEFT  ABI 0.99 1.24      Korben Carcione MAUREEN 07/18/2016 7:03 AM --  Laboratory Lab Results:  Recent Labs  07/17/16 0806 07/18/16 0130  WBC  --  15.2*  HGB 11.9* 10.1*  HCT 35.0* 34.7*  PLT  --  206   BMET  Recent Labs  07/17/16 0806 07/18/16 0130  NA 140 138  K 4.1 3.9  CL 106 104  CO2  --  27  GLUCOSE 101* 113*  BUN 8 5*  CREATININE 0.80 0.94  CALCIUM  --  8.6*    COAG Lab Results  Component Value Date   INR 1.14 07/06/2016   INR 1.09 07/05/2016   INR 1.06 07/15/2010   No results found for: PTT  Addendum  I have independently interviewed and examined the patient, and I agree with the physician assistant's findings.  R foot warm but pedal pulse not that strong.  Would get BLE ABI  and aortoiliac duplex to look at the R iliac arterial system to see if there has been interval occlusion since the intraop aortogram yesterday which demonstrated a patent iliac arterial system.  Minimal drainage from [facility] immediately adjacent to artery.  - BLE ABI and aortoiliac - until non-invasive test back, keep on heparin.  [name], MD, FACS [facility] [place] Office: [phone] Pager: [phone]  07/18/2016, 7:51 AM  Addendum  Bilateral iliac duplex: Bilateral common iliac and external iliac arteries appear patent.     ABI completed:    RIGHT    LEFT    PRESSURE WAVEFORM  PRESSURE WAVEFORM  BRACHIAL 135 Tri BRACHIAL 131 Tri         AT 127 Bi AT 159 Tri  PT 133 Bi PT 168 Tri                  RIGHT LEFT  ABI 0.99 1.24    - not clear why pulses feel so asx when  the ABI on R is normal also - aortoiliac segments patent - switch to Lovenox  - Lovenox bridge to coumadin   [name], MD, [facility] [place] Office: [phone] Pager: [phone]  07/18/2016, 2:59 PM

## 2016-07-18 NOTE — Evaluation (Signed)
Occupational Therapy Evaluation and Discharge Patient Details Name: [name] MRN: [id number] DOB: 03/31/1966 Today's Date: 07/18/2016    History of Present Illness 51 yo admitted for right femoral angioplasty. PMHx: GERD, anxiety, PAD, clotting disorder   Clinical Impression   Pt is mobilizing at a modified independent level and requires min assist to complete LB bathing and dressing. Pt will have assist of his wife as needed for ADL. All education completed. No further OT needs. Encouraged pt to walk with staff.    Follow Up Recommendations  No OT follow up    Equipment Recommendations  None recommended by OT    Recommendations for Other Services       Precautions / Restrictions Precautions Precautions: None      Mobility Bed Mobility Overal bed mobility: Modified Independent                Transfers Overall transfer level: Modified independent Equipment used: None                  Balance Overall balance assessment: No apparent balance deficits (not formally assessed)                                          ADL Overall ADL's : Needs assistance/impaired Eating/Feeding: Independent   Grooming: Wash/dry hands;Standing;Modified independent   Upper Body Bathing: Set up;Standing   Lower Body Bathing: Minimal assistance;Sit to/from stand Lower Body Bathing Details (indicate cue type and reason): recommended long handled bath sponge Upper Body Dressing : Sitting;Set up   Lower Body Dressing: Minimal assistance;Sitting/lateral leans Lower Body Dressing Details (indicate cue type and reason): wife will assist with reaching R foot  Toilet Transfer: Modified Independent;Regular Toilet;Ambulation   Toileting- Clothing Manipulation and Hygiene: Modified independent;Sit to/from Nurse, children's Details (indicate cue type and reason): educated verbally in technique Functional mobility during ADLs: Modified  independent General ADL Comments: recommended pt respect pain with LB bathing and dressing to avoid disturbing surgical site     Vision Patient Visual Report: No change from baseline       Perception     Praxis      Pertinent Vitals/Pain Pain Assessment: Faces Pain Score: 4  Faces Pain Scale: Hurts even more Pain Location: right groin Pain Descriptors / Indicators: Burning;Sharp Pain Intervention(s): Monitored during session     Hand Dominance Right   Extremity/Trunk Assessment Upper Extremity Assessment Upper Extremity Assessment: Overall WFL for tasks assessed   Lower Extremity Assessment Lower Extremity Assessment: Defer to PT evaluation RLE Deficits / Details: WFL for strength, tentative and decreased ROm due to post op pain   Cervical / Trunk Assessment Cervical / Trunk Assessment: Normal   Communication Communication Communication: No difficulties   Cognition Arousal/Alertness: Awake/alert Behavior During Therapy: WFL for tasks assessed/performed Overall Cognitive Status: Within Functional Limits for tasks assessed                     General Comments       Exercises       Shoulder Instructions      Home Living Family/patient expects to be discharged to:: Private residence Living Arrangements: Spouse/significant other (Simultaneous filing. User may not have seen previous data.) Available Help at Discharge: Family;Available PRN/intermittently Type of Home: Mobile home Home Access: Stairs to enter Entrance Stairs-Number of Steps: 5   Home Layout: One  level     Bathroom Shower/Tub: Teacher, early years/pre: Standard (with sink beside)     Home Equipment: None          Prior Functioning/Environment Level of Independence: Independent                 OT Problem List: Pain      OT Treatment/Interventions:      OT Goals(Current goals can be found in the care plan section) Acute Rehab OT Goals Patient Stated Goal: to  go home  OT Frequency:     Barriers to D/C:            Co-evaluation              End of Session Equipment Utilized During Treatment: Gait belt  Activity Tolerance: Patient tolerated treatment well Patient left: in bed;with call bell/phone within reach;with family/visitor present  OT Visit Diagnosis: Pain Pain - Right/Left: Right Pain - part of body: Leg                ADL either performed or assessed with clinical judgement  Time: 1203-1220 OT Time Calculation (min): 17 min Charges:  OT General Charges $OT Visit: 1 Procedure OT Evaluation $OT Eval Low Complexity: 1 Procedure G-Codes:      Malka So 07/18/2016, 1:39 PM  [phone]

## 2016-07-18 NOTE — Progress Notes (Signed)
[place] for heparin Indication: PAD  Allergies  Allergen Reactions  . No Known Allergies     Patient Measurements: Weight: 186 lb (84.4 kg)  Vital Signs: Temp: 98.3 F (36.8 C) (02/27 0800) Temp Source: Oral (02/27 0800) BP: 113/64 (02/27 0358) Pulse Rate: 61 (02/27 0358)  Assessment: 51 y/o male with PAD and suspected antiphospholipid antibody syndrome on Lovenox PTA.Then transitioned to heparin. Last heparin level was therapeutic at 0.39. Hgb stable at 10, plts wnl. No bleeding noted  Goal of Therapy:  Heparin level 0.3-0.7 units/ml Monitor platelets by anticoagulation protocol: Yes   Plan:  Continue heparin gtt at 1,300 units/hr Monitor daily heparin level, CBC, s/s of bleed Follow-up transition back to Lovenox as appropriate  Elenor Quinones, PharmD, BCPS Clinical Pharmacist Pager [phone] 07/18/2016 8:50 AM

## 2016-07-19 LAB — CBC
HEMATOCRIT: 37.1 % — AB (ref 39.0–52.0)
Hemoglobin: 10.8 g/dL — ABNORMAL LOW (ref 13.0–17.0)
MCH: 22.7 pg — ABNORMAL LOW (ref 26.0–34.0)
MCHC: 29.1 g/dL — ABNORMAL LOW (ref 30.0–36.0)
MCV: 77.9 fL — AB (ref 78.0–100.0)
Platelets: 226 10*3/uL (ref 150–400)
RBC: 4.76 MIL/uL (ref 4.22–5.81)
RDW: 28.8 % — ABNORMAL HIGH (ref 11.5–15.5)
WBC: 13 10*3/uL — AB (ref 4.0–10.5)

## 2016-07-19 LAB — PROTIME-INR
INR: 1.15
Prothrombin Time: 14.8 seconds (ref 11.4–15.2)

## 2016-07-19 MED ORDER — WARFARIN SODIUM 5 MG PO TABS
5.0000 mg | ORAL_TABLET | Freq: Once | ORAL | Status: AC
Start: 2016-07-19 — End: 2016-07-19
  Administered 2016-07-19: 5 mg via ORAL
  Filled 2016-07-19: qty 1

## 2016-07-19 NOTE — Progress Notes (Addendum)
  [facility] Progress Note  Subjective  - POD #2  Right groin is sore.   Objective Vitals:   07/18/16 2055 07/19/16 0412  BP: (!) 154/90 129/69  Pulse: 71 63  Resp: 20 20  Temp: 98.4 F (36.9 C) 98.2 F (36.8 C)    Intake/Output Summary (Last 24 hours) at 07/19/16 0751 Last data filed at 07/18/16 1841  Gross per 24 hour  Intake          1726.84 ml  Output             2170 ml  Net          -443.16 ml   Right groin without hematoma. Incision clean and intact. Drain with 25 cc serosanguinous output. Palpable right PT pulse.   Assessment/Planning: 51 y.o. male is s/p: right common femoral bovine patch angioplasty and angioplasty right common iliac artery, antiphospholipid syndrome  2 Days Post-Op   Aortoiliac duplex shows patent common iliac and external iliac arteries bilaterally. Palpable right PT pulse this am.  D/c drain due to low output. Lovenox/coumadin bridge.  Continue to mobilize.  D/c once coumadin therapeutic.   [name] 07/19/2016 7:51 AM --  Laboratory CBC    Component Value Date/Time   WBC 13.0 (H) 07/19/2016 0220   HGB 10.8 (L) 07/19/2016 0220   HGB 11.4 (L) 07/14/2016 1026   HCT 37.1 (L) 07/19/2016 0220   HCT 39.6 07/14/2016 1026   PLT 226 07/19/2016 0220   PLT 249 07/14/2016 1026    BMET    Component Value Date/Time   NA 138 07/18/2016 0130   NA 138 07/14/2016 1026   K 3.9 07/18/2016 0130   K 4.1 07/14/2016 1026   CL 104 07/18/2016 0130   CO2 27 07/18/2016 0130   CO2 25 07/14/2016 1026   GLUCOSE 113 (H) 07/18/2016 0130   GLUCOSE 115 07/14/2016 1026   BUN 5 (L) 07/18/2016 0130   BUN 6.9 (L) 07/14/2016 1026   CREATININE 0.94 07/18/2016 0130   CREATININE 0.9 07/14/2016 1026   CALCIUM 8.6 (L) 07/18/2016 0130   CALCIUM 9.7 07/14/2016 1026   GFRNONAA >60 07/18/2016 0130   GFRAA >60 07/18/2016 0130    COAG Lab Results  Component Value Date   INR 1.15 07/19/2016   INR 1.14 07/06/2016   INR 1.09  07/05/2016   No results found for: PTT  Antibiotics Anti-infectives    Start     Dose/Rate Route Frequency Ordered Stop   07/17/16 1800  cefUROXime (ZINACEF) 1.5 g in dextrose 5 % 50 mL IVPB     1.5 g 100 mL/hr over 30 Minutes Intravenous Every 12 hours 07/17/16 1517 07/18/16 0626   07/17/16 0745  cefUROXime (ZINACEF) 1.5 g in dextrose 5 % 50 mL IVPB     1.5 g 100 mL/hr over 30 Minutes Intravenous 30 min pre-op 07/17/16 0745 07/17/16 [place], PA-C [facility] Office: [phone] Pager: [phone] 07/19/2016 7:51 AM  Addendum  I have independently interviewed and examined the patient, and I agree with the physician assistant's findings.    [name], MD, FACS [facility] [place] Office: [phone] Pager: [phone]  07/19/2016, 10:26 AM

## 2016-07-19 NOTE — Progress Notes (Signed)
[place] for Lovenox / Coumadin Bridge Indication: PAD / Antiphospholipid syndrome  Allergies  Allergen Reactions  . No Known Allergies     Patient Measurements: Height: 5' 9.5" (176.5 cm) Weight: 190 lb (86.2 kg) IBW/kg (Calculated) : 71.85  Vital Signs: Temp: 98.2 F (36.8 C) (02/28 0412) Temp Source: Oral (02/28 0412) BP: 129/69 (02/28 0412) Pulse Rate: 63 (02/28 [id number])  Assessment: 51 y/o male with PAD and suspected antiphospholipid antibody syndrome on Lovenox PTA. He is now transitioning from lovenox to coumadin (noted s/p femoral artery exposure and bovine patch angioplasty on 2/26) -INR= 1.15, hg stable  Goal of Therapy:  Heparin level 0.3-0.7 units/ml Monitor platelets by anticoagulation protocol: Yes   Plan:  -No lovenox changes needed -coumadin 5mg  po today -Daily PT/INR  Hildred Laser, Pharm D 07/19/2016 12:18 PM

## 2016-07-19 NOTE — Consult Note (Signed)
   [facility] CM Inpatient Consult   07/19/2016  [name] 08-30-1965 [id number]    Came by to visit [name] on behalf of [place] [employer] program for [employer] employees/dependents with [employer]. Spoke with [name] at bedside during last hospitalization. Upon bedside visit today, he was sleeping soundly. Did not want to disturb. Made inpatient RNCM aware.    Marthenia Rolling, MSN-Ed, RN,BSN [facility] Liaison [phone]

## 2016-07-19 NOTE — Progress Notes (Signed)
JP drain removed without difficulty.

## 2016-07-19 NOTE — Discharge Instructions (Signed)
Information on my medicine - Coumadin   (Warfarin)  This medication education was reviewed with me or my healthcare representative as part of my discharge preparation.  The pharmacist that spoke with me during my hospital stay was:  Dareen Piano, [facility]  Why was Coumadin prescribed for you? Coumadin was prescribed for you because you have a blood clot or a medical condition that can cause an increased risk of forming blood clots. Blood clots can cause serious health problems by blocking the flow of blood to the heart, lung, or brain. Coumadin can prevent harmful blood clots from forming. As a reminder your indication for Coumadin is:   Peripheral artery disease; blood clotting disorder  What test will check on my response to Coumadin? While on Coumadin (warfarin) you will need to have an INR test regularly to ensure that your dose is keeping you in the desired range. The INR (international normalized ratio) number is calculated from the result of the laboratory test called prothrombin time (PT).  If an INR APPOINTMENT HAS NOT ALREADY BEEN MADE FOR YOU please schedule an appointment to have this lab work done by your health care provider within 7 days. Your INR goal is usually a number between:  2 to 3 or your provider may give you a more narrow range like 2-2.5.  Ask your health care provider during an office visit what your goal INR is.  What  do you need to  know  About  COUMADIN? Take Coumadin (warfarin) exactly as prescribed by your healthcare provider about the same time each day.  DO NOT stop taking without talking to the doctor who prescribed the medication.  Stopping without other blood clot prevention medication to take the place of Coumadin may increase your risk of developing a new clot or stroke.  Get refills before you run out.  What do you do if you miss a dose? If you miss a dose, take it as soon as you remember on the same day then continue your regularly scheduled regimen the  next day.  Do not take two doses of Coumadin at the same time.  Important Safety Information A possible side effect of Coumadin (Warfarin) is an increased risk of bleeding. You should call your healthcare provider right away if you experience any of the following: ? Bleeding from an injury or your nose that does not stop. ? Unusual colored urine (red or dark brown) or unusual colored stools (red or black). ? Unusual bruising for unknown reasons. ? A serious fall or if you hit your head (even if there is no bleeding).  Some foods or medicines interact with Coumadin (warfarin) and might alter your response to warfarin. To help avoid this: ? Eat a balanced diet, maintaining a consistent amount of Vitamin K. ? Notify your provider about major diet changes you plan to make. ? Avoid alcohol or limit your intake to 1 drink for women and 2 drinks for men per day. (1 drink is 5 oz. wine, 12 oz. beer, or 1.5 oz. liquor.)  Make sure that ANY health care provider who prescribes medication for you knows that you are taking Coumadin (warfarin).  Also make sure the healthcare provider who is monitoring your Coumadin knows when you have started a new medication including herbals and non-prescription products.  Coumadin (Warfarin)  Major Drug Interactions  Increased Warfarin Effect Decreased Warfarin Effect  Alcohol (large quantities) Antibiotics (esp. Septra/Bactrim, Flagyl, Cipro) Amiodarone (Cordarone) Aspirin (ASA) Cimetidine (Tagamet) Megestrol (Megace) NSAIDs (ibuprofen,  naproxen, etc.) °Piroxicam (Feldene) °Propafenone (Rythmol SR) °Propranolol (Inderal) °Isoniazid (INH) °Posaconazole (Noxafil) Barbiturates (Phenobarbital) °Carbamazepine (Tegretol) °Chlordiazepoxide (Librium) °Cholestyramine (Questran) °Griseofulvin °Oral Contraceptives °Rifampin °Sucralfate (Carafate) °Vitamin K  ° °Coumadin® (Warfarin) Major Herbal Interactions  °Increased Warfarin Effect Decreased Warfarin Effect   °Garlic °Ginseng °Ginkgo biloba Coenzyme Q10 °Green tea °St. John’s wort   ° °Coumadin® (Warfarin) FOOD Interactions  °Eat a consistent number of servings per week of foods HIGH in Vitamin K °(1 serving = ½ cup)  °Collards (cooked, or boiled & drained) °Kale (cooked, or boiled & drained) °Mustard greens (cooked, or boiled & drained) °Parsley *serving size only = ¼ cup °Spinach (cooked, or boiled & drained) °Swiss chard (cooked, or boiled & drained) °Turnip greens (cooked, or boiled & drained)  °Eat a consistent number of servings per week of foods MEDIUM-HIGH in Vitamin K °(1 serving = 1 cup)  °Asparagus (cooked, or boiled & drained) °Broccoli (cooked, boiled & drained, or raw & chopped) °Brussel sprouts (cooked, or boiled & drained) *serving size only = ½ cup °Lettuce, raw (green leaf, endive, romaine) °Spinach, raw °Turnip greens, raw & chopped  ° °These websites have more information on Coumadin (warfarin):  www.coumadin.com; °www.ahrq.gov/consumer/coumadin.htm; ° ° ° °

## 2016-07-20 ENCOUNTER — Telehealth: Payer: Self-pay | Admitting: Vascular Surgery

## 2016-07-20 LAB — CBC
HEMATOCRIT: 36.4 % — AB (ref 39.0–52.0)
HEMOGLOBIN: 10.9 g/dL — AB (ref 13.0–17.0)
MCH: 23.3 pg — ABNORMAL LOW (ref 26.0–34.0)
MCHC: 29.9 g/dL — AB (ref 30.0–36.0)
MCV: 77.8 fL — ABNORMAL LOW (ref 78.0–100.0)
Platelets: 233 10*3/uL (ref 150–400)
RBC: 4.68 MIL/uL (ref 4.22–5.81)
RDW: 29.1 % — AB (ref 11.5–15.5)
WBC: 10.1 10*3/uL (ref 4.0–10.5)

## 2016-07-20 LAB — PROTIME-INR
INR: 1.26
PROTHROMBIN TIME: 15.9 s — AB (ref 11.4–15.2)

## 2016-07-20 MED ORDER — WARFARIN SODIUM 7.5 MG PO TABS
7.5000 mg | ORAL_TABLET | Freq: Once | ORAL | Status: AC
  Administered 2016-07-20: 7.5 mg via ORAL
  Filled 2016-07-20: qty 1

## 2016-07-20 NOTE — Progress Notes (Signed)
Patient called this RN to the room to assess patient's right leg/foot. Patient stated that, "his right big toe feels colder than the left". Patient also c/o of numbness below the right femoral incision and where the JP drain was placed. Patient stated that, "the area feels numb about the size of his hand".   Patient's pulses were checked: right femoral - strong; popliteal sluggish; pedal pulse weak; tibial pulse strong. All of these pulses were check with the doppler.   Patient's right femoral site was slightly red and warm but soft. No bruising noted  This RN had another nurse to assess the patient. Both RNs agreed that the patient's right and left leg/foot was the same temperature. Patient walked around the unit with no numbness or any other issues. Will continue to monitor.

## 2016-07-20 NOTE — Progress Notes (Addendum)
[facility] [place]  Subjective  - doing well.   Objective 108/66 72 98.3 F (36.8 C) (Oral) 20 95% No intake or output data in the 24 hours ending 07/20/16 0750  Doppler strong PT, weaker DP/AT Groin soft well healing Heart RRR  Lungs non labored breathing  Assessment/Planning: POD # 3 right common femoral bovine patch angioplasty and angioplasty right common iliac artery, antiphospholipid syndrome   Will d/c telemetry He may shower INR 1.26 pending INR 2.0-3.0 before he is discharged He has a follow up appointment 07/25/2016 with dr. Alcario Drought office for INR checks as an out patient.  Laurence Slate [facility] 07/20/2016 7:50 AM --  Laboratory Lab Results:  Recent Labs  07/19/16 0220 07/20/16 0327  WBC 13.0* 10.1  HGB 10.8* 10.9*  HCT 37.1* 36.4*  PLT 226 233   BMET  Recent Labs  07/17/16 0806 07/18/16 0130  NA 140 138  K 4.1 3.9  CL 106 104  CO2  --  27  GLUCOSE 101* 113*  BUN 8 5*  CREATININE 0.80 0.94  CALCIUM  --  8.6*    COAG Lab Results  Component Value Date   INR 1.26 07/20/2016   INR 1.15 07/19/2016   INR 1.14 07/06/2016   No results found for: PTT  Addendum  I have independently interviewed and examined the patient, and I agree with the physician assistant's findings.  Awaiting full load of coumadin prior to discharge given his antiphospholipid syndrome.  [name], MD, FACS [facility] [place] Office: [phone] Pager: [phone]  07/20/2016, 8:34 AM

## 2016-07-20 NOTE — Telephone Encounter (Signed)
-----   Message from Mena Goes, RN sent at 07/20/2016  9:23 AM EST ----- Regarding: 2-3 weeks   ----- Message ----- From: Ulyses Amor, PA-C Sent: 07/20/2016   8:19 AM To: Vvs Charge Pool  Sorry this guy had thrombectomy and revision of right iliac stent.  He will need f/u in office in 2-3 weeks with Dr. Bridgett Larsson

## 2016-07-20 NOTE — Progress Notes (Signed)
[place] for Lovenox / Coumadin Bridge Indication: PAD / Antiphospholipid syndrome  Allergies  Allergen Reactions  . No Known Allergies     Patient Measurements: Height: 5' 9.5" (176.5 cm) Weight: 190 lb (86.2 kg) IBW/kg (Calculated) : 71.85  Vital Signs: Temp: 98.3 F (36.8 C) (03/01 0532) Temp Source: Oral (03/01 0532) BP: 108/66 (03/01 0532) Pulse Rate: 72 (03/01 0532)  Assessment: 51 y/o male with PAD and suspected antiphospholipid antibody syndrome on Lovenox PTA. He is now transitioning from lovenox to coumadin (noted s/p femoral artery exposure and bovine patch angioplasty on 2/26) -INR= 1.26, hg stable  Goal of Therapy:  Heparin level 0.3-0.7 units/ml Monitor platelets by anticoagulation protocol: Yes   Plan:  -No lovenox changes needed -coumadin 7.5mg  po today -Daily PT/INR  Hildred Laser, Pharm D 07/20/2016 10:05 AM

## 2016-07-20 NOTE — Telephone Encounter (Signed)
Sched appt 08/11/16 at 10:00. Spoke to pt to inform pt of appt.

## 2016-07-21 ENCOUNTER — Ambulatory Visit: Payer: 59

## 2016-07-21 LAB — PROTIME-INR
INR: 1.4
PROTHROMBIN TIME: 17.3 s — AB (ref 11.4–15.2)

## 2016-07-21 MED ORDER — WARFARIN SODIUM 5 MG PO TABS
5.0000 mg | ORAL_TABLET | Freq: Once | ORAL | Status: AC
  Administered 2016-07-21: 5 mg via ORAL
  Filled 2016-07-21: qty 1

## 2016-07-21 NOTE — Progress Notes (Addendum)
[facility] [place]  Subjective  - Doing well walked a lot yesterday.  Pre-op symptoms have resolved.   Objective 119/65 61 97.9 F (36.6 C) (Oral) 20 100%  Intake/Output Summary (Last 24 hours) at 07/21/16 0743 Last data filed at 07/20/16 1243  Gross per 24 hour  Intake              600 ml  Output                0 ml  Net              600 ml   Palpable PT right, foot warm and well perfused Heart RRR Lungs non labored breathing    Assessment/Planning: POD # 4  right common femoral bovine patch angioplasty and angioplasty right common iliac artery, antiphospholipid syndrome  INR pending therapeutic 1.4   Laurence Slate [facility] 07/21/2016 7:43 AM --  Laboratory Lab Results:  Recent Labs  07/19/16 0220 07/20/16 0327  WBC 13.0* 10.1  HGB 10.8* 10.9*  HCT 37.1* 36.4*  PLT 226 233   BMET No results for input(s): NA, K, CL, CO2, GLUCOSE, BUN, CREATININE, CALCIUM in the last 72 hours.  COAG Lab Results  Component Value Date   INR 1.40 07/21/2016   INR 1.26 07/20/2016   INR 1.15 07/19/2016   No results found for: PTT  Addendum  I have independently interviewed and examined the patient, and I agree with the physician assistant's findings.  Small hematoma in R groin: not unexpected as this patient has been maintained on anticoagulation throughout due to his antiphospholipid syndrome.  INR 1.4  - keep until Coumadin therapeutic  - watch for overshoot of anticoagulation especially given some hematoma already in R groin  [name], MD, FACS [facility] [place]: [phone] Pager: [phone]  07/21/2016, 8:02 AM

## 2016-07-21 NOTE — Progress Notes (Signed)
[name] for Lovenox / Coumadin Bridge Indication: PAD / Antiphospholipid syndrome  Allergies  Allergen Reactions  . No Known Allergies     Patient Measurements: Height: 5' 9.5" (176.5 cm) Weight: 190 lb (86.2 kg) IBW/kg (Calculated) : 71.85  Vital Signs: Temp: 97.9 F (36.6 C) (03/02 0437) Temp Source: Oral (03/02 0437) BP: 119/65 (03/02 0437) Pulse Rate: 61 (03/02 0437)  Assessment: 51 y/o male with PAD and suspected antiphospholipid antibody syndrome on Lovenox PTA. He is now transitioning from lovenox to coumadin (noted s/p femoral artery exposure and bovine patch angioplasty on 2/26) -INR= 1.26>1.4, CBC stable. Noted small hematoma in R groin. Do not overshoot anticoagulation.  Goal of Therapy:  Heparin level 0.3-0.7 units/ml Monitor platelets by anticoagulation protocol: Yes   Plan:  Lovenox 85mg  [name] Q12  Give Coumadin 5mg  PO x 1 Monitor daily INR   [name], PharmD, [facility] Clinical Staff Pharmacist Pager [phone]   07/21/2016 9:52 AM

## 2016-07-22 LAB — PROTIME-INR
INR: 1.53
Prothrombin Time: 18.6 seconds — ABNORMAL HIGH (ref 11.4–15.2)

## 2016-07-22 MED ORDER — WARFARIN SODIUM 3 MG PO TABS
6.0000 mg | ORAL_TABLET | Freq: Once | ORAL | Status: AC
  Administered 2016-07-22: 6 mg via ORAL
  Filled 2016-07-22: qty 2

## 2016-07-22 MED ORDER — OXYCODONE-ACETAMINOPHEN 5-325 MG PO TABS
1.0000 | ORAL_TABLET | ORAL | Status: DC | PRN
  Administered 2016-07-22 – 2016-07-26 (×20): 2 via ORAL
  Filled 2016-07-22 (×20): qty 2

## 2016-07-22 NOTE — Progress Notes (Addendum)
[facility] of   Subjective  - Pain at incision site.   Objective 134/81 63 97.8 F (36.6 C) (Oral) 18 100% No intake or output data in the 24 hours ending 07/22/16 0821  Right groin with minimal edema at incision and lateral thigh.  No erythema or drainage. Palpable PT Sensation right foot intact and active range of motion Heart RRR Lungs non labored breathing  Assessment/Planning: POD # 5 right common femoral bovine patch angioplasty and angioplasty right common iliac artery, antiphospholipid syndrome  Pending INR 2.0-3.o today INR 1.5 Has f/u appoint with Dr. Juleen China for INR checks Tuesday 07/25/2016  COLLINS, EMMA MAUREEN 07/22/2016 8:21 AM -- 2+ PT pulse, some right groin pain Home when INR > 2  Ruta Hinds, MD [facility] [place]: [phone] Pager: [phone]  Laboratory Lab Results:  Recent Labs  07/20/16 0327  WBC 10.1  HGB 10.9*  HCT 36.4*  PLT 233   BMET No results for input(s): NA, K, CL, CO2, GLUCOSE, BUN, CREATININE, CALCIUM in the last 72 hours.  COAG Lab Results  Component Value Date   INR 1.53 07/22/2016   INR 1.40 07/21/2016   INR 1.26 07/20/2016   No results found for: PTT

## 2016-07-22 NOTE — Progress Notes (Signed)
[place] for Lovenox / Coumadin Bridge Indication: PAD / Antiphospholipid syndrome  Allergies  Allergen Reactions  . No Known Allergies     Patient Measurements: Height: 5' 9.5" (176.5 cm) Weight: 190 lb (86.2 kg) IBW/kg (Calculated) : 71.85  Vital Signs: Temp: 97.8 F (36.6 C) (03/03 0503) Temp Source: Oral (03/03 0503) BP: 134/81 (03/03 0503) Pulse Rate: 63 (03/03 0503)  Assessment: Anticoag: Lovenox PTA for suspected APS, hx PAD with thrombosis right iliac stent. Then transitioned to heparin.  Now to transition back to a Lovenox / Coumadin (overlap D4) -INR= 1.26>1.4>1.53, CBC stable. Noted small hematoma in R groin. Do not overshoot anticoagulation per MD note. - 3/3: R groin with minimal edema.  Goal of Therapy:  Heparin level 0.3-0.7 units/ml Monitor platelets by anticoagulation protocol: Yes   Plan:  Lovenox 85mg  [place] Q12  Give Coumadin 6mg  PO x 1 Monitor daily INR   Nile Prisk S. Alford Highland, PharmD, [facility] Clinical Staff Pharmacist Pager [phone]   07/22/2016 12:28 PM

## 2016-07-23 LAB — PROTIME-INR
INR: 1.79
PROTHROMBIN TIME: 21 s — AB (ref 11.4–15.2)

## 2016-07-23 LAB — CBC
HCT: 39.7 % (ref 39.0–52.0)
HEMOGLOBIN: 12 g/dL — AB (ref 13.0–17.0)
MCH: 23.8 pg — AB (ref 26.0–34.0)
MCHC: 30.2 g/dL (ref 30.0–36.0)
MCV: 78.8 fL (ref 78.0–100.0)
PLATELETS: 315 10*3/uL (ref 150–400)
RBC: 5.04 MIL/uL (ref 4.22–5.81)
RDW: 29.7 % — ABNORMAL HIGH (ref 11.5–15.5)
WBC: 9.5 10*3/uL (ref 4.0–10.5)

## 2016-07-23 MED ORDER — WARFARIN SODIUM 3 MG PO TABS
6.0000 mg | ORAL_TABLET | Freq: Once | ORAL | Status: AC
Start: 2016-07-23 — End: 2016-07-23
  Administered 2016-07-23: 6 mg via ORAL
  Filled 2016-07-23: qty 2

## 2016-07-23 NOTE — Progress Notes (Signed)
[facility] [place]  Subjective  - feels ok   Objective 134/89 61 97.8 F (36.6 C) (Oral) 16 100%  Intake/Output Summary (Last 24 hours) at 07/23/16 1518 Last data filed at 07/23/16 1000  Gross per 24 hour  Intake              700 ml  Output                3 ml  Net              697 ml   Right leg groin incision 3 cm mass fluctuant non pulsatile Right foot 2+ PT pulse  Assessment/Planning: Probable right groin seroma versus hematom Viable foot Continue coumadin INR 1.8 today most likely home tomorrow  Ruta Hinds 07/23/2016 3:18 PM --  Laboratory Lab Results:  Recent Labs  07/23/16 0508  WBC 9.5  HGB 12.0*  HCT 39.7  PLT 315   BMET No results for input(s): NA, K, CL, CO2, GLUCOSE, BUN, CREATININE, CALCIUM in the last 72 hours.  COAG Lab Results  Component Value Date   INR 1.79 07/23/2016   INR 1.53 07/22/2016   INR 1.40 07/21/2016   No results found for: PTT

## 2016-07-23 NOTE — Progress Notes (Signed)
[place] for Lovenox / Coumadin Bridge Indication: PAD / Antiphospholipid syndrome  Allergies  Allergen Reactions  . No Known Allergies     Patient Measurements: Height: 5' 9.5" (176.5 cm) Weight: 190 lb (86.2 kg) IBW/kg (Calculated) : 71.85  Vital Signs: Temp: 97.8 F (36.6 C) (03/04 0518) Temp Source: Oral (03/04 0518) BP: 134/89 (03/04 0518) Pulse Rate: 61 (03/04 0518)  Assessment: Anticoag: Lovenox PTA for suspected APS, hx PAD with thrombosis right iliac stent. Then transitioned to heparin.  Now to transition back to a Lovenox / Coumadin (overlap D4) -INR=1.79,, CBC improved today. Noted small hematoma in R groin. Do not overshoot anticoagulation per MD note. - 3/3: R groin with minimal edema.  Goal of Therapy:  Heparin level 0.3-0.7 units/ml Monitor platelets by anticoagulation protocol: Yes   Plan:  Lovenox 85mg  [place] Q12  Give Coumadin 6mg  PO x 1 again tonight Monitor daily INR   Jezabella Schriever S. Alford Highland, PharmD, [facility] Clinical Staff Pharmacist Pager [phone]   07/23/2016 1:40 PM

## 2016-07-24 LAB — PROTIME-INR
INR: 1.75
Prothrombin Time: 20.7 seconds — ABNORMAL HIGH (ref 11.4–15.2)

## 2016-07-24 MED ORDER — WARFARIN SODIUM 7.5 MG PO TABS
7.5000 mg | ORAL_TABLET | Freq: Once | ORAL | Status: AC
Start: 2016-07-24 — End: 2016-07-24
  Administered 2016-07-24: 7.5 mg via ORAL
  Filled 2016-07-24: qty 1

## 2016-07-24 NOTE — Consult Note (Signed)
   [facility] CM Inpatient Consult   07/24/2016  Tylor Duchow Mar 11, 1966 [id number]      [facility] Care Management/Link to Wellness follow up. Came by to visit [name] on behalf of Norm Parcel to [facility] Care Management program. Explained program. Denies having any Link to Wellness/[place] needs at this time. Provided brochure and confirmed best contact number as [phone] for post discharge call.   Marthenia Rolling, MSN-Ed, RN,BSN [facility] Liaison [phone]

## 2016-07-24 NOTE — Progress Notes (Addendum)
  [facility] Progress Note  Subjective    Right groin is sore  Objective Vitals:   07/23/16 2228 07/24/16 0540  BP: 136/77 140/80  Pulse: (!) 57 (!) 57  Resp: 16 16  Temp: 97.9 F (36.6 C) 97.7 F (36.5 C)    Intake/Output Summary (Last 24 hours) at 07/24/16 0744 Last data filed at 07/23/16 1000  Gross per 24 hour  Intake                0 ml  Output                2 ml  Net               -2 ml   Right groin with fluctuant mass, no drainage or erythema, no pulsatility Palpable right PT  Assessment/Planning: 51 y.o. male is s/p: right common femoral bovine patch angioplasty and angioplasty right common iliac artery, antiphospholipid syndrome 7 Days Post-Op   Monitor right groin seroma vs hematoma. Does not appear infected.  INR still not therapeutic today.  D/c once INR therapeutic.   Alvia Grove 07/24/2016 7:44 AM --  Laboratory CBC    Component Value Date/Time   WBC 9.5 07/23/2016 0508   HGB 12.0 (L) 07/23/2016 0508   HGB 11.4 (L) 07/14/2016 1026   HCT 39.7 07/23/2016 0508   HCT 39.6 07/14/2016 1026   PLT 315 07/23/2016 0508   PLT 249 07/14/2016 1026    BMET    Component Value Date/Time   NA 138 07/18/2016 0130   NA 138 07/14/2016 1026   K 3.9 07/18/2016 0130   K 4.1 07/14/2016 1026   CL 104 07/18/2016 0130   CO2 27 07/18/2016 0130   CO2 25 07/14/2016 1026   GLUCOSE 113 (H) 07/18/2016 0130   GLUCOSE 115 07/14/2016 1026   BUN 5 (L) 07/18/2016 0130   BUN 6.9 (L) 07/14/2016 1026   CREATININE 0.94 07/18/2016 0130   CREATININE 0.9 07/14/2016 1026   CALCIUM 8.6 (L) 07/18/2016 0130   CALCIUM 9.7 07/14/2016 1026   GFRNONAA >60 07/18/2016 0130   GFRAA >60 07/18/2016 0130    COAG Lab Results  Component Value Date   INR 1.75 07/24/2016   INR 1.79 07/23/2016   INR 1.53 07/22/2016   No results found for: PTT  Antibiotics Anti-infectives    Start     Dose/Rate Route Frequency Ordered Stop   07/17/16 1800  cefUROXime  (ZINACEF) 1.5 g in dextrose 5 % 50 mL IVPB     1.5 g 100 mL/hr over 30 Minutes Intravenous Every 12 hours 07/17/16 1517 07/18/16 0626   07/17/16 0745  cefUROXime (ZINACEF) 1.5 g in dextrose 5 % 50 mL IVPB     1.5 g 100 mL/hr over 30 Minutes Intravenous 30 min pre-op 07/17/16 0745 07/17/16 [place], PA-C [facility] Office: [phone] Pager: [phone] 07/24/2016 7:44 AM  Addendum  I have independently interviewed and examined the patient, and I agree with the physician assistant's findings.  R groin ok.  Keep an eye on.  Not unexpected given pt has been on full anticoagulation since start of his operation due to his antiphospholipid syndrome.  The slow load of coumadin likely suggests he does indeed have antiphospholipid syndrome.  - awaiting load of coumadin  [name], MD, FACS [facility] [place] Office: [phone] Pager: [phone]  07/24/2016, 8:43 AM

## 2016-07-24 NOTE — Progress Notes (Signed)
ANTICOAGULATION CONSULT NOTE - Follow Up Consult  Pharmacy Consult for Coumadin with Lovenox bridge Indication: PAD, antiphospholipid syndrome  Allergies  Allergen Reactions  . No Known Allergies     Patient Measurements: Height: 5' 9.5" (176.5 cm) Weight: 190 lb (86.2 kg) IBW/kg (Calculated) : 71.85 Lovenox Dosing Weight: 86 kg  Vital Signs: Temp: 97.7 F (36.5 C) (03/05 0540) Temp Source: Oral (03/05 0540) BP: 140/80 (03/05 0540) Pulse Rate: 57 (03/05 0540)  Labs:  Recent Labs  07/22/16 0355 07/23/16 0508 07/24/16 0215  HGB  --  12.0*  --   HCT  --  39.7  --   PLT  --  315  --   LABPROT 18.6* 21.0* 20.7*  INR 1.53 1.79 1.75    Estimated Creatinine Clearance: 96.7 mL/min (by C-G formula based on SCr of 0.94 mg/dL).  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 Was on Lovenox prior to admission, heparin peri-operatively, then transitioned to Lovenox and Coumadin on 07/18/16.  Day # 7 overlap.  INR had been trending up, but no rise today.  INR 1.79>1.75.  Has had Coumadin 6 mg the last 2 days.  Have been trying not to overshoot with Coumadin due to small right groin hematoma.  Goal of Therapy:  INR 2-3 Anti-Xa level 0.6-1 units/ml 4hrs after LMWH dose given Monitor platelets by anticoagulation protocol: Yes   Plan:   Coumadin 7.5 mg x 1 today.  Continue Lovenox 85 mg sq q12hrs.  Daily PT/INR while in the hospital.  CBC in am.  Arty Baumgartner, [place] Pager: [phone] 07/24/2016,11:34 AM

## 2016-07-25 ENCOUNTER — Encounter (HOSPITAL_COMMUNITY)
Admission: RE | Disposition: A | Discharge status: Home / Self Care | Payer: Self-pay | Source: Ambulatory Visit | Attending: Vascular Surgery

## 2016-07-25 ENCOUNTER — Other Ambulatory Visit: Payer: 59

## 2016-07-25 ENCOUNTER — Inpatient Hospital Stay (HOSPITAL_COMMUNITY): Payer: 59 | Admitting: Anesthesiology

## 2016-07-25 ENCOUNTER — Encounter (HOSPITAL_COMMUNITY): Payer: Self-pay | Admitting: Certified Registered Nurse Anesthetist

## 2016-07-25 ENCOUNTER — Telehealth: Payer: Self-pay | Admitting: Hematology

## 2016-07-25 DIAGNOSIS — I97638 Postprocedural hematoma of a circulatory system organ or structure following other circulatory system procedure: Secondary | ICD-10-CM

## 2016-07-25 HISTORY — PX: GROIN DEBRIDEMENT: SHX5159

## 2016-07-25 LAB — PROTIME-INR
INR: 2.48
INR: 2.32
Prothrombin Time: 27.3 seconds — ABNORMAL HIGH (ref 11.4–15.2)
Prothrombin Time: 25.9 seconds — ABNORMAL HIGH (ref 11.4–15.2)

## 2016-07-25 LAB — TYPE AND SCREEN
ABO/RH(D): A POS
Antibody Screen: NEGATIVE

## 2016-07-25 LAB — CBC
HCT: 39.6 % (ref 39.0–52.0)
Hemoglobin: 11.9 g/dL — ABNORMAL LOW (ref 13.0–17.0)
MCH: 23.7 pg — ABNORMAL LOW (ref 26.0–34.0)
MCHC: 30.1 g/dL (ref 30.0–36.0)
MCV: 78.9 fL (ref 78.0–100.0)
PLATELETS: 330 10*3/uL (ref 150–400)
RBC: 5.02 MIL/uL (ref 4.22–5.81)
RDW: 29.3 % — AB (ref 11.5–15.5)
WBC: 9.9 10*3/uL (ref 4.0–10.5)

## 2016-07-25 SURGERY — DEBRIDEMENT, INGUINAL REGION
Anesthesia: General | Laterality: Right

## 2016-07-25 MED ORDER — OXYCODONE HCL 5 MG/5ML PO SOLN: 5.0000 mg | Freq: Once | ORAL | Status: AC | PRN

## 2016-07-25 MED ORDER — SUGAMMADEX SODIUM 200 MG/2ML IV SOLN
INTRAVENOUS | Status: DC | PRN
  Administered 2016-07-25: 180 mg via INTRAVENOUS

## 2016-07-25 MED ORDER — OXYCODONE-ACETAMINOPHEN 5-325 MG PO TABS: 1.0000 | ORAL_TABLET | ORAL | 0 refills | Status: DC | PRN

## 2016-07-25 MED ORDER — ROCURONIUM BROMIDE 10 MG/ML (PF) SYRINGE
PREFILLED_SYRINGE | INTRAVENOUS | Status: DC | PRN
  Administered 2016-07-25: 50 mg via INTRAVENOUS

## 2016-07-25 MED ORDER — ONDANSETRON HCL 4 MG/2ML IJ SOLN
INTRAMUSCULAR | Status: AC
  Filled 2016-07-25: qty 2

## 2016-07-25 MED ORDER — SODIUM CHLORIDE 0.9 % IV SOLN
INTRAVENOUS | Status: DC | PRN
  Administered 2016-07-25: 11:00:00

## 2016-07-25 MED ORDER — WARFARIN SODIUM 5 MG PO TABS
5.0000 mg | ORAL_TABLET | Freq: Once | ORAL | Status: AC
  Administered 2016-07-25: 5 mg via ORAL
  Filled 2016-07-25: qty 1

## 2016-07-25 MED ORDER — OXYCODONE HCL 5 MG PO TABS
5.0000 mg | ORAL_TABLET | Freq: Once | ORAL | Status: AC | PRN
  Administered 2016-07-25: 5 mg via ORAL

## 2016-07-25 MED ORDER — FENTANYL CITRATE (PF) 100 MCG/2ML IJ SOLN
INTRAMUSCULAR | Status: DC | PRN
Start: 2016-07-25 — End: 2016-07-25
  Administered 2016-07-25: 50 ug via INTRAVENOUS
  Administered 2016-07-25: 100 ug via INTRAVENOUS
  Administered 2016-07-25: 50 ug via INTRAVENOUS

## 2016-07-25 MED ORDER — FENTANYL CITRATE (PF) 100 MCG/2ML IJ SOLN
INTRAMUSCULAR | Status: AC
  Administered 2016-07-25 (×2): 25 ug
  Administered 2016-07-25: 50 ug
  Filled 2016-07-25: qty 2

## 2016-07-25 MED ORDER — CEPHALEXIN 500 MG PO CAPS
500.0000 mg | ORAL_CAPSULE | Freq: Four times a day (QID) | ORAL | Status: DC
  Administered 2016-07-25 – 2016-07-26 (×4): 500 mg via ORAL
  Filled 2016-07-25 (×4): qty 1

## 2016-07-25 MED ORDER — HYDROMORPHONE HCL 1 MG/ML IJ SOLN: 0.2500 mg | INTRAMUSCULAR | Status: DC | PRN

## 2016-07-25 MED ORDER — WARFARIN - PHARMACIST DOSING INPATIENT: Freq: Every day | Status: DC

## 2016-07-25 MED ORDER — WARFARIN SODIUM 5 MG PO TABS: 5.0000 mg | ORAL_TABLET | Freq: Every day | ORAL | 5 refills | Status: DC

## 2016-07-25 MED ORDER — OXYCODONE HCL 5 MG PO TABS
ORAL_TABLET | ORAL | Status: AC
  Filled 2016-07-25: qty 1

## 2016-07-25 MED ORDER — MIDAZOLAM HCL 2 MG/2ML IJ SOLN
INTRAMUSCULAR | Status: DC | PRN
  Administered 2016-07-25: 2 mg via INTRAVENOUS

## 2016-07-25 MED ORDER — SODIUM CHLORIDE 0.9 % IV SOLN: Freq: Once | INTRAVENOUS | Status: DC

## 2016-07-25 MED ORDER — PROPOFOL 10 MG/ML IV BOLUS
INTRAVENOUS | Status: DC | PRN
  Administered 2016-07-25: 200 mg via INTRAVENOUS

## 2016-07-25 MED ORDER — FENTANYL CITRATE (PF) 100 MCG/2ML IJ SOLN
INTRAMUSCULAR | Status: AC
  Filled 2016-07-25: qty 2

## 2016-07-25 MED ORDER — MIDAZOLAM HCL 2 MG/2ML IJ SOLN
INTRAMUSCULAR | Status: AC
  Filled 2016-07-25: qty 2

## 2016-07-25 MED ORDER — LACTATED RINGERS IV SOLN
INTRAVENOUS | Status: DC
  Administered 2016-07-25: 10:00:00 via INTRAVENOUS

## 2016-07-25 MED ORDER — 0.9 % SODIUM CHLORIDE (POUR BTL) OPTIME
TOPICAL | Status: DC | PRN
  Administered 2016-07-25: 1000 mL

## 2016-07-25 MED ORDER — LIDOCAINE 2% (20 MG/ML) 5 ML SYRINGE
INTRAMUSCULAR | Status: DC | PRN
  Administered 2016-07-25: 60 mg via INTRAVENOUS

## 2016-07-25 MED ORDER — SUGAMMADEX SODIUM 200 MG/2ML IV SOLN
INTRAVENOUS | Status: AC
  Filled 2016-07-25: qty 2

## 2016-07-25 MED ORDER — ONDANSETRON HCL 4 MG/2ML IJ SOLN: 4.0000 mg | Freq: Four times a day (QID) | INTRAMUSCULAR | Status: DC | PRN

## 2016-07-25 MED ORDER — DEXTROSE 5 % IV SOLN
1.5000 g | INTRAVENOUS | Status: AC
  Administered 2016-07-25: 1.5 g via INTRAVENOUS
  Filled 2016-07-25: qty 1.5

## 2016-07-25 SURGICAL SUPPLY — 31 items
DERMABOND ADVANCED (GAUZE/BANDAGES/DRESSINGS) ×1
DERMABOND ADVANCED .7 DNX12 (GAUZE/BANDAGES/DRESSINGS) ×1 IMPLANT
DRAIN HEMOVAC 1/8 X 5 (WOUND CARE) ×2 IMPLANT
ELECT REM PT RETURN 9FT ADLT (ELECTROSURGICAL) ×2
ELECTRODE REM PT RTRN 9FT ADLT (ELECTROSURGICAL) ×1 IMPLANT
EVACUATOR SILICONE 100CC (DRAIN) ×2 IMPLANT
GAUZE SPONGE 4X4 12PLY STRL (GAUZE/BANDAGES/DRESSINGS) ×2 IMPLANT
GAUZE SPONGE 4X4 16PLY XRAY LF (GAUZE/BANDAGES/DRESSINGS) ×2 IMPLANT
GLOVE BIO SURGEON STRL SZ 6.5 (GLOVE) ×4 IMPLANT
GLOVE BIO SURGEON STRL SZ7 (GLOVE) ×2 IMPLANT
GLOVE BIOGEL PI IND STRL 6.5 (GLOVE) ×1 IMPLANT
GLOVE BIOGEL PI IND STRL 7.0 (GLOVE) ×1 IMPLANT
GLOVE BIOGEL PI INDICATOR 6.5 (GLOVE) ×1
GLOVE BIOGEL PI INDICATOR 7.0 (GLOVE) ×1
GOWN STRL REUS W/ TWL LRG LVL3 (GOWN DISPOSABLE) ×2 IMPLANT
GOWN STRL REUS W/ TWL XL LVL3 (GOWN DISPOSABLE) ×1 IMPLANT
GOWN STRL REUS W/TWL LRG LVL3 (GOWN DISPOSABLE) ×2
GOWN STRL REUS W/TWL XL LVL3 (GOWN DISPOSABLE) ×1
KIT BASIN OR (CUSTOM PROCEDURE TRAY) ×2 IMPLANT
NS IRRIG 1000ML POUR BTL (IV SOLUTION) ×2 IMPLANT
PACK GENERAL/GYN (CUSTOM PROCEDURE TRAY) ×2 IMPLANT
PAD ARMBOARD 7.5X6 YLW CONV (MISCELLANEOUS) ×4 IMPLANT
SUT ETHILON 3 0 PS 1 (SUTURE) ×2 IMPLANT
SUT PROLENE 6 0 BV (SUTURE) ×2 IMPLANT
SUT VIC AB 2-0 CT1 27 (SUTURE) ×1
SUT VIC AB 2-0 CT1 TAPERPNT 27 (SUTURE) ×1 IMPLANT
SUT VIC AB 3-0 SH 27 (SUTURE) ×1
SUT VIC AB 3-0 SH 27X BRD (SUTURE) ×1 IMPLANT
SUT VICRYL 4-0 PS2 18IN ABS (SUTURE) ×2 IMPLANT
TAPE CLOTH SURG 4X10 WHT LF (GAUZE/BANDAGES/DRESSINGS) ×2 IMPLANT
WATER STERILE IRR 1000ML POUR (IV SOLUTION) ×2 IMPLANT

## 2016-07-25 NOTE — Progress Notes (Addendum)
ANTICOAGULATION CONSULT NOTE - Follow Up Consult  Pharmacy Consult for Coumadin with Lovenox bridge Indication: PAD, antiphospholipid syndrome  Allergies  Allergen Reactions  . No Known Allergies     Patient Measurements: Height: 5' 9.5" (176.5 cm) Weight: 190 lb (86.2 kg) IBW/kg (Calculated) : 71.85 Lovenox Dosing Weight: 86 kg  Vital Signs: Temp: 98 F (36.7 C) (03/06 1205) Temp Source: Oral (03/06 1205) BP: 141/80 (03/06 1205) Pulse Rate: 63 (03/06 1205)  Labs:  Recent Labs  07/23/16 0508 07/24/16 0215 07/25/16 0157  HGB 12.0*  --  11.9*  HCT 39.7  --  39.6  PLT 315  --  330  LABPROT 21.0* 20.7* 27.3*  INR 1.79 1.75 2.48    Estimated Creatinine Clearance: 96.7 mL/min (by C-G formula based on SCr of 0.94 mg/dL).  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 Was on Lovenox prior to admission, heparin peri-operatively for right femoral artery thrombectomy on 07/17/16, then transitioned to Lovenox and Coumadin on 07/18/16.       Day # 8 Lovenox/Coumadin overlap.  INR up to 2.48 this morning, first day therapeutic. Received morning dose of Lovenox at ~5:30am. Had been trying not to overshoot with Coumadin due to small right groin hematoma.  Hematoma increased in size overnight and now s/p evacuation in OR this morning.  Drain in place.  Received 2 units FFP this am.   Lovenox and Coumadin to resume tonight; confirmed with K. Pablo Ledger, PA-C who discussed with Dr. Donzetta Matters.  Goal of Therapy:  INR 2-3 Anti-Xa level 0.6-1 units/ml 4hrs after LMWH dose given Monitor platelets by anticoagulation protocol: Yes   Plan:   Will recheck INR this afternoon to help guide further Coumadin dosing.  Lovenox 85 mg sq q12hrs to resume at 6pm tonight.  Daily PT/INR and CBC.  Stop Lovenox when INR >2.  Monitor for any bleeding.  Arty Baumgartner, [place] Pager: [phone] 07/25/2016,12:51 PM   Addendum;   INR 2.32 this afternoon.   Will give  Coumadin 5 mg tonight.   Discontinue Lovenox with INR >2.   Follow up PT/INR in am.  Consuello Masse, RPh  07/25/2016 3:30 PM

## 2016-07-25 NOTE — Anesthesia Postprocedure Evaluation (Addendum)
Anesthesia Post Note  Patient: [name]  Procedure(s) Performed: Procedure(s) (LRB): EVACUATION HEMATOMA RIGHT GROIN (Right)  Patient location during evaluation: PACU Anesthesia Type: General Level of consciousness: awake and alert and patient cooperative Pain management: pain level controlled Vital Signs Assessment: post-procedure vital signs reviewed and stable Respiratory status: spontaneous breathing and respiratory function stable Cardiovascular status: stable Anesthetic complications: no       Last Vitals:  Vitals:   07/25/16 1151 07/25/16 1205  BP:  (!) 141/80  Pulse: 64 63  Resp: 18 20  Temp:  36.7 C    Last Pain:  Vitals:   07/25/16 1533  TempSrc:   PainSc: [place]

## 2016-07-25 NOTE — Progress Notes (Signed)
   Daily Progress Note   Assessment/Planning: POD #8 s/p R CFA bovine patch angioplasty, TE R CIA   R groin mass has grown substantially overnight with now some blanching skin so I think exploration and evacuation of hematoma is indicated  Transfuse 2 u FFP to reverse  Subjective  - 8 Days Post-Op  C/o mass growing and pain in rt groin  Objective Vitals:   07/24/16 0540 07/24/16 1540 07/24/16 2055 07/25/16 0455  BP: 140/80 134/72 (!) 147/89 120/72  Pulse: (!) 57 82 66 62  Resp: 16 18 18    Temp: 97.7 F (36.5 C) 98.6 F (37 C) 98.6 F (37 C) 98.7 F (37.1 C)  TempSrc: Oral Oral Oral Oral  SpO2: 100% 96% 98% 97%  Weight:      Height:        No intake or output data in the 24 hours ending 07/25/16 0814  PULM  CTAB CV  RRR GI  soft, NTND VASC  R groin: ballotable mass appears more firms and larger than yesterday, skin is blanching to palpation  Laboratory CBC    Component Value Date/Time   WBC 9.9 07/25/2016 0157   HGB 11.9 (L) 07/25/2016 0157   HGB 11.4 (L) 07/14/2016 1026   HCT 39.6 07/25/2016 0157   HCT 39.6 07/14/2016 1026   PLT 330 07/25/2016 0157   PLT 249 07/14/2016 1026    BMET    Component Value Date/Time   NA 138 07/18/2016 0130   NA 138 07/14/2016 1026   K 3.9 07/18/2016 0130   K 4.1 07/14/2016 1026   CL 104 07/18/2016 0130   CO2 27 07/18/2016 0130   CO2 25 07/14/2016 1026   GLUCOSE 113 (H) 07/18/2016 0130   GLUCOSE 115 07/14/2016 1026   BUN 5 (L) 07/18/2016 0130   BUN 6.9 (L) 07/14/2016 1026   CREATININE 0.94 07/18/2016 0130   CREATININE 0.9 07/14/2016 1026   CALCIUM 8.6 (L) 07/18/2016 0130   CALCIUM 9.7 07/14/2016 1026   GFRNONAA >60 07/18/2016 0130   GFRAA >60 07/18/2016 0130     [name], MD, FACS [facility] [place] Office: [phone] Pager: [phone]  07/25/2016, 8:14 AM

## 2016-07-25 NOTE — Anesthesia Procedure Notes (Signed)
Procedure Name: Intubation Date/Time: 07/25/2016 10:20 AM Performed by: Julieta Bellini Pre-anesthesia Checklist: Patient identified, Emergency Drugs available, Suction available and Patient being monitored Patient Re-evaluated:Patient Re-evaluated prior to inductionOxygen Delivery Method: Circle system utilized Preoxygenation: Pre-oxygenation with 100% oxygen Intubation Type: IV induction Ventilation: Mask ventilation without difficulty Laryngoscope Size: Mac and 3 Grade View: Grade I Tube type: Oral Number of attempts: 1 Airway Equipment and Method: Stylet Placement Confirmation: positive ETCO2,  ETT inserted through vocal cords under direct vision and breath sounds checked- equal and bilateral Secured at: 23 cm Tube secured with: Tape Dental Injury: Teeth and Oropharynx as per pre-operative assessment

## 2016-07-25 NOTE — Telephone Encounter (Signed)
Patient stated that he saw the appointments on MyChart

## 2016-07-25 NOTE — Transfer of Care (Signed)
Immediate Anesthesia Transfer of Care Note  Patient: [name]  Procedure(s) Performed: Procedure(s): EVACUATION HEMATOMA RIGHT GROIN (Right)  Patient Location: PACU  Anesthesia Type:General  Level of Consciousness: awake, alert , oriented and patient cooperative  Airway & Oxygen Therapy: Patient Spontanous Breathing  Post-op Assessment: Report given to RN, Post -op Vital signs reviewed and stable and Patient moving all extremities X 4  Post vital signs: Reviewed and stable  Last Vitals:  Vitals:   07/25/16 0858 07/25/16 0914  BP: 126/75 130/79  Pulse: 63 63  Resp: 16 16  Temp: 36.8 C 36.6 C    Last Pain:  Vitals:   07/25/16 0914  TempSrc: Oral  PainSc:       Patients Stated Pain Goal: 0 ([id number])  Complications: No apparent anesthesia complications

## 2016-07-25 NOTE — Op Note (Signed)
    Patient name: [name] MRN: [id number] DOB: 1966/05/02 Sex: male  07/25/2016 Pre-operative Diagnosis: right groin hematoma Post-operative diagnosis:  Same Surgeon:  Erlene Quan C. Donzetta Matters, MD Assistant: Silva Bandy, PA Procedure Performed: Evacuation of right groin hematoma  Indications:  51 year old male recent right femoral patch angioplasty now with hematoma on Coumadin. He is indicated for drainage.  Findings: There was 30 mL of hematoma drain for the right groin. The patch on the artery was intact all underlying tissue appeared to be viable. There was a layer closed on top of the patch and a drain was placed on top of that.   Procedure:  The patient was identified in the holding area and taken to the operating room where he was placed supine on the operating table general anesthesia was induced he was sterilely prepped and draped the right lower extremity in the usual fashion timeout called. He was given antibiotics. We made an incision through the existing incision we encountered 30 mL of blood immediately. There was no further bleeding past this. We irrigated the wound copiously there was some oozing common from deep in the wound. We did expose the artery fashion appeared to be intact there was no bleeding from the artery itself. We irrigated further we then closed layer back on top of the artery placed a 10 French flat drain through a counterincision sutured in place with 3-0 nylon suture. We then closed in layers with 2-0 Vicryl interrupted 3-0 Vicryl for Monocryl at the level of skin and Dermabond above that. Patient is laterally from anesthesia having tolerated the procedure well without immediate complication.     Oakley Kossman C. Donzetta Matters, MD [facility] [place] Office: [phone] Pager: [phone]

## 2016-07-25 NOTE — Anesthesia Preprocedure Evaluation (Addendum)
Anesthesia Evaluation  Patient identified by MRN, date of birth, ID band Patient awake    Reviewed: Allergy & Precautions, H&P , NPO status , Patient's Chart, lab work & pertinent test results  Airway Mallampati: II   Neck ROM: full    Dental  (+) Teeth Intact, Dental Advisory Given   Pulmonary Current Smoker,    breath sounds clear to auscultation       Cardiovascular  Rhythm:regular Rate:Normal     Neuro/Psych Anxiety Depression  Neuromuscular disease    GI/Hepatic GERD  ,  Endo/Other    Renal/GU      Musculoskeletal   Abdominal   Peds  Hematology   Anesthesia Other Findings   Reproductive/Obstetrics                            Anesthesia Physical Anesthesia Plan  ASA: II  Anesthesia Plan: General   Post-op Pain Management:    Induction: Intravenous  Airway Management Planned: Oral ETT  Additional Equipment:   Intra-op Plan:   Post-operative Plan: Extubation in OR  Informed Consent: I have reviewed the patients History and Physical, chart, labs and discussed the procedure including the risks, benefits and alternatives for the proposed anesthesia with the patient or authorized representative who has indicated his/her understanding and acceptance.   Dental advisory given  Plan Discussed with: CRNA, Anesthesiologist and Surgeon  Anesthesia Plan Comments:        Anesthesia Quick Evaluation

## 2016-07-26 ENCOUNTER — Telehealth: Payer: Self-pay | Admitting: Vascular Surgery

## 2016-07-26 ENCOUNTER — Encounter (HOSPITAL_COMMUNITY): Payer: Self-pay | Admitting: Vascular Surgery

## 2016-07-26 LAB — CBC
HEMATOCRIT: 39.2 % (ref 39.0–52.0)
Hemoglobin: 11.6 g/dL — ABNORMAL LOW (ref 13.0–17.0)
MCH: 23.6 pg — ABNORMAL LOW (ref 26.0–34.0)
MCHC: 29.6 g/dL — AB (ref 30.0–36.0)
MCV: 79.7 fL (ref 78.0–100.0)
PLATELETS: 318 10*3/uL (ref 150–400)
RBC: 4.92 MIL/uL (ref 4.22–5.81)
RDW: 29.3 % — AB (ref 11.5–15.5)
WBC: 10.4 10*3/uL (ref 4.0–10.5)

## 2016-07-26 LAB — PREPARE FRESH FROZEN PLASMA
UNIT DIVISION: 0
Unit division: 0

## 2016-07-26 LAB — BPAM FFP
Blood Product Expiration Date: 201803072359
Blood Product Expiration Date: 201803072359
ISSUE DATE / TIME: 201803060846
ISSUE DATE / TIME: 201803061012
UNIT TYPE AND RH: 6200
Unit Type and Rh: 6200

## 2016-07-26 LAB — PROTIME-INR
INR: 2.37
PROTHROMBIN TIME: 26.4 s — AB (ref 11.4–15.2)

## 2016-07-26 MED ORDER — WARFARIN SODIUM 5 MG PO TABS: 5.0000 mg | ORAL_TABLET | Freq: Once | ORAL | Status: DC

## 2016-07-26 MED ORDER — TAMSULOSIN HCL 0.4 MG PO CAPS: 0.4000 mg | ORAL_CAPSULE | Freq: Every day | ORAL | 0 refills | Status: DC

## 2016-07-26 MED ORDER — CEPHALEXIN 500 MG PO CAPS: 500.0000 mg | ORAL_CAPSULE | Freq: Four times a day (QID) | ORAL | 0 refills | Status: DC

## 2016-07-26 MED ORDER — TAMSULOSIN HCL 0.4 MG PO CAPS
0.4000 mg | ORAL_CAPSULE | Freq: Every day | ORAL | Status: DC
  Administered 2016-07-26: 0.4 mg via ORAL
  Filled 2016-07-26: qty 1

## 2016-07-26 MED FILL — TAMSULOSIN HCL 0.4 MG CAP: 0.4 | 30 days supply | Qty: 30 | Fill #0

## 2016-07-26 MED FILL — WARFARIN SODIUM 5 MG TABLET: 5 | 90 days supply | Qty: 90 | Fill #0

## 2016-07-26 MED FILL — OXYCODONE/APAP 5/325 MG TAB: 5-325 | 3 days supply | Qty: 30 | Fill #0

## 2016-07-26 MED FILL — CEPHALEXIN 500 MG CAPSULE: 500 | 10 days supply | Qty: 40 | Fill #0

## 2016-07-26 NOTE — Telephone Encounter (Signed)
Spoke to pt for PA to see for drain removal 3/9 and PO on 3/23

## 2016-07-26 NOTE — Progress Notes (Signed)
Order received to discharge patient.  PIV access removed.  Discharge instructions, follow up, medications and instructions for their use discussed with patient. 

## 2016-07-26 NOTE — Progress Notes (Signed)
  Discharge instructions given, Pt verbalized understanding and all questions were answered.

## 2016-07-26 NOTE — Progress Notes (Signed)
ANTICOAGULATION CONSULT NOTE - Follow Up Consult  Pharmacy Consult for Coumadin Indication: PAD, antiphospholipid syndrome  Allergies  Allergen Reactions  . No Known Allergies     Patient Measurements: Height: 5' 9.5" (176.5 cm) Weight: 190 lb (86.2 kg) IBW/kg (Calculated) : 71.85 Lovenox Dosing Weight: 86 kg  Vital Signs: Temp: 98.2 F (36.8 C) (03/07 0555) Temp Source: Oral (03/07 0555) BP: 118/81 (03/07 0555) Pulse Rate: 68 (03/07 0555)  Labs:  Recent Labs  07/25/16 0157 07/25/16 1351 07/26/16 0203  HGB 11.9*  --  11.6*  HCT 39.6  --  39.2  PLT 330  --  318  LABPROT 27.3* 25.9* 26.4*  INR 2.48 2.32 2.37    Estimated Creatinine Clearance: 96.7 mL/min (by C-G formula based on SCr of 0.94 mg/dL).  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 Was on Lovenox prior to admission, heparin peri-operatively for right femoral artery thrombectomy on 07/17/16, then transitioned to Lovenox and Coumadin on 07/18/16.    Anticoag: Lovenox PTA for suspected APAS, hx PAD with thrombosis right iliac stent. Then transitioned to heparin.  Now to transitioned back to Lovenox / Coumadin (overlap D9)  Known small hematoma in R groin, [facility]VS didn't want Rx to overshoot anticoagulation. I  - 3/6, INR up to 2.48, got Enox 85 mg at 0529, then c/o R groin mass has grown substantially overnight. Some blanching skin. Going for exploration and evacuation today. 2 units FFP. -> post-op, 30 ml drained from hematoma, patch done, has a drain. No bleeding from the artery itself per OR note. Resume AC tonight per VVS. Confirmed with Maudie Mercury PA who d/w Dr. Donzetta Matters. INR still 2.32 after 2 units FFP this am.   - 3/7: Hgb 11.6, Plts 318 s/p evacuation 30cc hematoma. INR 2.37 remains in goal.  Goal of Therapy:  INR 2-3 Anti-Xa level 0.6-1 units/ml 4hrs after LMWH dose given Monitor platelets by anticoagulation protocol: Yes   Plan:   Coumadin 5 mg tonight.  Dc Lovenox with  INR >2  Continue daily PT/INR   Laveyah Oriol S. Alford Highland, PharmD, BCPS Clinical Staff Pharmacist Pager [phone]  Wayland Salinas, [place] Pager: [phone] 07/26/2016,8:48 AM

## 2016-07-26 NOTE — Progress Notes (Signed)
Tech offered Pt a bath. Pt stated to tech that he will be going home today and will get a full shower once he gets home.

## 2016-07-26 NOTE — Progress Notes (Signed)
   Daily Progress Note   Assessment/Planning: POD #9 s/p R CFA BPA, TE R CIA complicated by expected R groin hematoma POD #1 evac of hematoma R groin   INR demonstrates Coumadin therapeutic so Lovenox can be discontinued  Continue Coumadin at 5 mg daily x 2 for antiphospholipid syndrome.  Coumadin clinic follow up Friday.  Will see pt in clinic on Friday to pull drain  Continue Keflex 500 mg PO qid x 10 days as a precaution due to the re-exposure and drain placement  Flomax for BPH sx  Subjective  - 1 Day Post-Op  R groin feels better, notes some BPH sx  Objective Vitals:   07/25/16 1151 07/25/16 1205 07/25/16 2143 07/26/16 0555  BP:  (!) 141/80 (!) 146/80 118/81  Pulse: 64 63 67 68  Resp: 18 20 18 18   Temp:  98 F (36.7 C) 97.5 F (36.4 C) 98.2 F (36.8 C)  TempSrc:  Oral Oral Oral  SpO2: 100% 92% 98% 98%  Weight:      Height:         Intake/Output Summary (Last 24 hours) at 07/26/16 0955 Last data filed at 07/26/16 8891  Gross per 24 hour  Intake             1192 ml  Output               25 ml  Net             1167 ml    PULM  CTAB CV  RRR GI  soft, NTND VASC  R groin: no obvious hematoma, no echymosis, drain with serosang drainage: 30 cc, palpable PT  Laboratory CBC    Component Value Date/Time   WBC 10.4 07/26/2016 0203   HGB 11.6 (L) 07/26/2016 0203   HGB 11.4 (L) 07/14/2016 1026   HCT 39.2 07/26/2016 0203   HCT 39.6 07/14/2016 1026   PLT 318 07/26/2016 0203   PLT 249 07/14/2016 1026    BMET    Component Value Date/Time   NA 138 07/18/2016 0130   NA 138 07/14/2016 1026   K 3.9 07/18/2016 0130   K 4.1 07/14/2016 1026   CL 104 07/18/2016 0130   CO2 27 07/18/2016 0130   CO2 25 07/14/2016 1026   GLUCOSE 113 (H) 07/18/2016 0130   GLUCOSE 115 07/14/2016 1026   BUN 5 (L) 07/18/2016 0130   BUN 6.9 (L) 07/14/2016 1026   CREATININE 0.94 07/18/2016 0130   CREATININE 0.9 07/14/2016 1026   CALCIUM 8.6 (L) 07/18/2016 0130   CALCIUM 9.7  07/14/2016 1026   GFRNONAA >60 07/18/2016 0130   GFRAA >60 07/18/2016 0130     [name], MD, FACS [facility] [place]: [phone] Pager: [phone]  07/26/2016, 9:55 AM

## 2016-07-26 NOTE — Telephone Encounter (Signed)
-----   Message from Mena Goes, RN sent at 07/26/2016 10:21 AM EST ----- Regarding: schedule 2 weeks   ----- Message ----- From: Ulyses Amor, PA-C Sent: 07/26/2016  10:06 AM To: Vvs Charge Pool  F/U with Dr. Bridgett Larsson or PA on Friday 07/28/2016 for drain removal right groin.  F/U with Dr. Bridgett Larsson only in 2 weeks from today for first post op visit.  S/P thrombectomy right iliac stent and second procedure of right groin exploration with evacuation of lymphocele.

## 2016-07-27 ENCOUNTER — Telehealth: Payer: Self-pay | Admitting: *Deleted

## 2016-07-27 NOTE — Discharge Summary (Addendum)
[facility] Discharge Summary   Patient ID:  [name] MRN: [id number] DOB/AGE: May 13, 1966 51 y.o.  Admit date: 07/17/2016 Discharge date: 07/26/2016 Date of Surgery: 07/17/2016 - 07/25/2016 Surgeon: Juliann Mule): Waynetta Sandy, MD  Admission Diagnosis: Right leg intermittent claudication Occluded right common iliac artery  Antiphospholipid syndrome  Discharge Diagnoses:  Right leg intermittent claudication Occluded right common iliac artery  Antiphospholipid syndrome  Secondary Diagnoses: Past Medical History:  Diagnosis Date  . Anemia   . Anxiety   . Blood dyscrasia    clotting  disorder  . Dyspnea    w/ exertion    hx pleuralsy yrs ago  . GERD (gastroesophageal reflux disease)   . Hyperlipidemia   . PAD (peripheral artery disease) ([place])   . Smoker     Procedure(s): EVACUATION HEMATOMA RIGHT GROIN  Discharged Condition: good  HPI: [name] is a 52 y.o. (08/18/1965) male who presents with chief complaint: short distance intermittent claudication.  The patient's symptoms have not progressed.  The patient's symptoms are: short distance intermittent claudication.  The patient had undergone R CIA stenting of a CTO on 02/24/16 but the stent occluded on subsequent angiogram on 05/11/16.  I recommended we proceed with fem-fem bypass to alievate his R common iliac artery occlusion. 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  The patient's treatment regimen currently included: maximal medical management.  Dr. Debara Pickett has been seeing this patient for pre-op clearance.  This patient is low risk at this point.  He returns for preoperative counseling.   Hospital Course:  [name] is a 51 y.o. male is S/P  Procedure(s): 07/17/2016 1. Aortogram 2. Thrombectomy of right common iliac artery  3. Angioplasty right common iliac artery   EVACUATION HEMATOMA RIGHT GROIN  07/25/2016 The patch on the artery was intact all underlying tissue appeared to be viable. There was a layer closed on top of the patch and a drain was placed on top of that. Placed on Coumadin. He will be discharged on 5 mg daily.  Post hematoma evacuation he has a JP drain.  Continue Coumadin at 5 mg daily x 2 for antiphospholipid syndrome.  Coumadin clinic follow up Friday.  Will see pt in clinic on Friday to pull drain  Continue Keflex 500 mg PO qid x 10 days as a precaution due to the re-exposure and drain placement  Flomax for BPH sx    Significant Diagnostic Studies: CBC Lab Results  Component Value Date   WBC 10.4 07/26/2016   HGB 11.6 (L) 07/26/2016   HCT 39.2 07/26/2016   MCV 79.7 07/26/2016   PLT 318 07/26/2016    BMET    Component Value Date/Time   NA 138 07/18/2016 0130   NA 138 07/14/2016 1026   K 3.9 07/18/2016 0130   K 4.1 07/14/2016 1026   CL 104 07/18/2016 0130   CO2 27 07/18/2016 0130   CO2 25 07/14/2016 1026   GLUCOSE 113 (H) 07/18/2016 0130   GLUCOSE 115 07/14/2016 1026   BUN 5 (L) 07/18/2016 0130   BUN 6.9 (L) 07/14/2016 1026   CREATININE 0.94 07/18/2016 0130   CREATININE 0.9 07/14/2016 1026   CALCIUM 8.6 (L) 07/18/2016 0130   CALCIUM 9.7 07/14/2016 1026   GFRNONAA >60 07/18/2016 0130   GFRAA >60 07/18/2016 0130   COAG Lab Results  Component Value Date   INR 2.37 07/26/2016   INR 2.32 07/25/2016  INR 2.48 07/25/2016     Disposition:  Discharge to :Home Discharge Instructions    AMB Referral to [facility] Management    Complete by:  As directed    Please assign UMR member for post discharge call. Currently at [facility]. Was asleep during attempted bedside visit. Marthenia Rolling, [place], RN,BSN-THN [facility] [id number]-[phone]   Reason for consult:  Please assign UMR member for post discharge call   Expected date of contact:  1-3 days (reserved for hospital discharges)   Call MD for:  redness, tenderness, or signs of  infection (pain, swelling, bleeding, redness, odor or green/yellow discharge around incision site)    Complete by:  As directed    Call MD for:  severe or increased pain, loss or decreased feeling  in affected limb(s)    Complete by:  As directed    Call MD for:  temperature >100.5    Complete by:  As directed    Discharge instructions    Complete by:  As directed    You may shower daily   Discharge patient    Complete by:  As directed    Discharge disposition:  01-Home or Self Care   Discharge patient date:  07/26/2016   Discharge patient    Complete by:  As directed    Discharge disposition:  01-Home or Self Care   Discharge patient date:  07/26/2016   Driving Restrictions    Complete by:  As directed    No driving for 1 week   Increase activity slowly    Complete by:  As directed    Walk with assistance use walker or cane as needed   Lifting restrictions    Complete by:  As directed    No heavy lifting for 2-3 weeks   Resume previous diet    Complete by:  As directed      Allergies as of 07/26/2016      Reactions   No Known Allergies       Medication List    TAKE these medications   cephALEXin 500 MG capsule Commonly known as:  KEFLEX Take 1 capsule (500 mg total) by mouth 4 (four) times daily.   CHANTIX CONTINUING MONTH PAK 1 MG tablet Generic drug:  varenicline Take 1 mg by mouth 2 (two) times daily.   docusate sodium 100 MG capsule Commonly known as:  COLACE Take 1 capsule (100 mg total) by mouth 2 (two) times daily.   DULoxetine 30 MG capsule Commonly known as:  CYMBALTA Take one 30 mg tablet by mouth once a day for the first week. Then increase to two 30 mg tablets ( total 60mg ) by mouth once daily.   enoxaparin 80 MG/0.8ML injection Commonly known as:  LOVENOX Inject 0.8 mLs (80 mg total) into the skin every 12 (twelve) hours.   esomeprazole 20 MG capsule Commonly known as:  NEXIUM Take 20 mg by mouth daily.   ferrous sulfate 325 (65 FE) MG  tablet Take 1 tablet (325 mg total) by mouth daily with breakfast.   gabapentin 100 MG capsule Commonly known as:  NEURONTIN Take 2 capsules (200 mg total) by mouth 3 (three) times daily. What changed:  when to take this   mupirocin ointment 2 % Commonly known as:  BACTROBAN Apply 1 application topically 2 (two) times daily.   oxyCODONE-acetaminophen 5-325 MG tablet Commonly known as:  PERCOCET/ROXICET Take 1 tablet by mouth every 4 (four) hours as needed for severe pain. What changed:  Another medication with the same name was added. Make sure you understand how and when to take each.   oxyCODONE-acetaminophen 5-325 MG tablet Commonly known as:  PERCOCET/ROXICET Take 1-2 tablets by mouth every 4 (four) hours as needed for severe pain. What changed:  You were already taking a medication with the same name, and this prescription was added. Make sure you understand how and when to take each.   rosuvastatin 5 MG tablet Commonly known as:  CRESTOR Take 1 tablet (5 mg total) by mouth daily.   sucralfate 1 g tablet Commonly known as:  CARAFATE Take 1 tablet (1 g total) by mouth 4 (four) times daily -  with meals and at bedtime.   tamsulosin 0.4 MG Caps capsule Commonly known as:  FLOMAX Take 1 capsule (0.4 mg total) by mouth daily.   warfarin 5 MG tablet Commonly known as:  COUMADIN Take 1 tablet (5 mg total) by mouth daily.      Verbal and written Discharge instructions given to the patient. Wound care per Discharge AVS Follow-up Information    Briscoe Deutscher, DO Follow up in 2 day(s).   Specialty:  Family Medicine Why:  appoinment 07/28/2016 for INR checks.  pls call for appointment on Friday. Contact information: [place] [address] [phone]        [name], MD. Call in 2 day(s).   Specialties:  Vascular Surgery, Cardiology Why:  f/u with Dr. Bridgett Larsson Friday 07/28/2016 for drain removal and then in 2 weeks for firsrt post op appointment. 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  This stent occluded in 1-2 months.  As this is rare, I had the patient evaluated for other etiologies including thrombophilia and thromboembolism.  The thrombophilia work-up was consistent with antiphospholipid syndrome.  We discussed proceeding with right femorofemoral bypass vs thrombectomy of right common iliac artery.  On day of surgery, I was able to complete a thrombectomy of the right common iliac artery stent.  I re-ballooned the stent to fully appose the stent and re-fix the stent to the wall in case the Fogarty catheter had loosened the stent.  I sewed a patch on this common femoral artery arteriotomy.  This patient's extended admission was essentially loading coumadin on a Lovenox bridge.  Once the patient became therapeutic, he developed a symptomatic hematoma in the right groin.  This was drained by Dr. Donzetta Matters and a JP drain placed.  The next day the patient remained therapeutic on Coumadin and was discharged with minimal hematoma in right groin and JP in place.  He was schedule to follow up the coming Friday for removal of the JP and wound check.  He was already set up for Coumadin management with his PCP.   [name], MD, FACS [facility] [place] Office: [phone] Pager: [phone]  08/01/2016, 9:48 AM

## 2016-07-27 NOTE — Telephone Encounter (Signed)
FYI

## 2016-07-27 NOTE — Telephone Encounter (Signed)
Per Admission Note: 07/25/2016 Pre-operative Diagnosis: right groin hematoma Post-operative diagnosis:  Same Surgeon:  Erlene Quan C. Donzetta Matters, MD Assistant: Silva Bandy, PA Procedure Performed: Evacuation of right groin hematoma  Indications:  51 year old male recent right femoral patch angioplasty now with hematoma on Coumadin. He is indicated for drainage.  Findings: There was 30 mL of hematoma drain for the right groin. The patch on the artery was intact all underlying tissue appeared to be viable. There was a layer closed on top of the patch and a drain was placed on top of that. --- Transition Care Management Follow-up Telephone Call   Date discharged? 07/26/16   How have you been since you were released from the hospital? Good   Do you understand why you were in the hospital? yes   Do you understand the discharge instructions? yes   Where were you discharged to? Home   Items Reviewed:  Medications reviewed: yes  Allergies reviewed: yes  Dietary changes reviewed: yes  Referrals reviewed: yes   Functional Questionnaire:   Activities of Daily Living (ADLs):   He states they are independent in the following: ambulation, bathing and hygiene, feeding, continence, grooming, toileting and dressing States they require assistance with the following: none   Any transportation issues/concerns?: yes. Pt and his wife share a car. His wife will call and make the Hospital Follow Up appt.   Any patient concerns? Yes. Pt would like to discuss Cymbalta side effects when he comes in.   Confirmed importance and date/time of follow-up visits scheduled yes. Pts wife will call and schedule appt.  Confirmed with patient if condition begins to worsen call PCP or go to the ER.  Patient was given the office number and encouraged to call back with question or concerns.  : yes

## 2016-07-28 ENCOUNTER — Encounter: Payer: Self-pay | Admitting: Family Medicine

## 2016-07-28 ENCOUNTER — Ambulatory Visit (INDEPENDENT_AMBULATORY_CARE_PROVIDER_SITE_OTHER): Payer: 59 | Admitting: Vascular Surgery

## 2016-07-28 ENCOUNTER — Ambulatory Visit (INDEPENDENT_AMBULATORY_CARE_PROVIDER_SITE_OTHER): Payer: 59 | Admitting: Family Medicine

## 2016-07-28 VITALS — BP 130/84 | HR 75 | Temp 97.7°F | Resp 18 | Ht 69.0 in | Wt 186.0 lb

## 2016-07-28 VITALS — BP 128/82 | HR 68 | Temp 97.9°F | Wt 186.4 lb

## 2016-07-28 DIAGNOSIS — S301XXD Contusion of abdominal wall, subsequent encounter: Secondary | ICD-10-CM

## 2016-07-28 DIAGNOSIS — Z7901 Long term (current) use of anticoagulants: Secondary | ICD-10-CM

## 2016-07-28 DIAGNOSIS — D500 Iron deficiency anemia secondary to blood loss (chronic): Secondary | ICD-10-CM | POA: Diagnosis not present

## 2016-07-28 DIAGNOSIS — F17200 Nicotine dependence, unspecified, uncomplicated: Secondary | ICD-10-CM

## 2016-07-28 DIAGNOSIS — K219 Gastro-esophageal reflux disease without esophagitis: Secondary | ICD-10-CM | POA: Diagnosis not present

## 2016-07-28 DIAGNOSIS — Z5181 Encounter for therapeutic drug level monitoring: Secondary | ICD-10-CM | POA: Diagnosis not present

## 2016-07-28 LAB — CBC
HCT: 36.4 % — ABNORMAL LOW (ref 38.5–50.0)
Hemoglobin: 11.2 g/dL — ABNORMAL LOW (ref 13.2–17.1)
MCH: 24.6 pg — ABNORMAL LOW (ref 27.0–33.0)
MCHC: 30.8 g/dL — ABNORMAL LOW (ref 32.0–36.0)
MCV: 79.8 fL — ABNORMAL LOW (ref 80.0–100.0)
MPV: 9.3 fL (ref 7.5–12.5)
Platelets: 345 10*3/uL (ref 140–400)
RBC: 4.56 MIL/uL (ref 4.20–5.80)
RDW: 31.2 % — ABNORMAL HIGH (ref 11.0–15.0)
WBC: 11.2 10*3/uL — ABNORMAL HIGH (ref 3.8–10.8)

## 2016-07-28 LAB — FERRITIN: Ferritin: 192 ng/mL (ref 20–380)

## 2016-07-28 LAB — POCT INR: INR: 2.5

## 2016-07-28 NOTE — Progress Notes (Signed)
[name] is a 51 y.o. male is here to discuss:  History of Present Illness:   [name] is a 51 y.o. male who is s/p right common femoral artery exposure and bovine patch angioplasty, thrombectomy and angioplasty of right common iliac artery on 07/17/16 for lifestyle limiting right leg claudication. The patient previously underwent endovascular recannulization of a chronically occluded right common iliac artery with a common iliac artery stent that occluded slightly after 1 month. While undergoing cardiac workup for a femoral femoral bypass, the patient was found to have antiphospholipid syndrome.  The patient had an uncomplicated postoperative course initially. He was restarted on Coumadin and remained in the hospital while waiting for his INR to became therapeutic. He did develop a right groin mass on postop day 6 that grew substantially overnight on postop day 8. He was given 2 units of FFP and taken to the operating room on 07/25/2016 for evacuation of right groin hematoma. There was approximately 30 mL of hematoma drained from the right groin. The patch on the femoral artery was intact with viable tissue. A deep layer was closed over the patch and a drain was placed on top. He was discharged the following day. He was placed on Keflex 500 mg by mouth 4 times a day for 10 days as a precaution due to drain placement.  He presents to me for Coumadin management as well as continued management of his chronic pain, related GERD - which is improving since stopping NSAIDs, and smoking cessation. Overall, he is in good spirits today. His claudication-related pain has improved. He has been taking Hydrocodone for arthralgias, but is ready to start weaning. He has been taking Carafte for his GERD, but is ready to stop. He continues Chantix and is down to 1/2 ppd. He stopped the Cymbalta due to headaches.    Patient Care Team: Briscoe Deutscher, DO as PCP - General (Family Medicine) Pixie Casino, MD as Consulting Physician (Cardiology) Brunetta Genera, MD as Consulting Physician (Hematology) Clarene Essex, MD as Consulting Physician (Gastroenterology) Conrad Walker, MD as Consulting Physician (Vascular Surgery) Serena Colonel, RN as Painter Management  PMHx, SurgHx, SocialHx, FamHx, Medications, and Allergies were reviewed in the Visit Navigator and updated as appropriate.   Patient Active Problem List   Diagnosis Date Noted  . Thrombosis of right iliac artery ([place]) 07/17/2016  . History of alcohol abuse 07/11/2016  . Chronic left shoulder pain 07/08/2016  . Lung nodules 07/08/2016  . Iron deficiency anemia due to chronic blood loss   . Gastroesophageal reflux disease   . Dyslipidemia 06/27/2016  . Preoperative cardiovascular examination 06/27/2016  . Antiphospholipid antibody positive 06/27/2016  . Shortness of breath 06/02/2016  . Chest pain 06/02/2016  . Thrombus 06/02/2016  . PAD (peripheral artery disease) ([place]) 06/02/2016  . Claudication ([place]) 06/02/2016  . Effusion of right knee 05/19/2016  . 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 Atherosclerosis of native arteries of extremity with intermittent claudication ([place]) 02/24/2016  . Weakness of right leg 01/11/2016  . Piriformis syndrome of right side 12/29/2015  . Leg length discrepancy 12/29/2015  . Reactive depression 06/24/2014  . Smoker 06/24/2014    Social History  Substance Use Topics  . Smoking status: Current Every Day Smoker    Packs/day: 0.10    Types: Cigarettes, E-cigarettes  . Smokeless tobacco: Never Used     Comment: 1/2 pk per day.  Vaporizor has 0% nicotine.  Trying to quit  .  Alcohol use No     Comment: hx  etoh    Current Medications and Allergies:    .  cephALEXin (KEFLEX) 500 MG capsule, Take 1 capsule (500 mg total) by mouth 4 (four) times daily., Disp: 40 capsule, Rfl: 0 .  CHANTIX CONTINUING MONTH PAK 1 MG tablet, Take 1 mg by mouth 2 (two)  times daily., Disp: , Rfl: 1 .  docusate sodium (COLACE) 100 MG capsule, Take 1 capsule (100 mg total) by mouth 2 (two) times daily., Disp: 10 capsule, Rfl: 0 .  esomeprazole (NEXIUM) 20 MG capsule, Take 20 mg by mouth daily. , Disp: , Rfl:  .  gabapentin (NEURONTIN) 100 MG capsule, Take 2 capsules (200 mg total) by mouth 3 (three) times daily., Disp: 180 capsule, Rfl: 3 .  oxyCODONE-acetaminophen (PERCOCET/ROXICET) 5-325 MG tablet, Take 1-2 tablets by mouth every 4 (four) hours as needed for severe pain., Disp: 30 tablet, Rfl: 0 .  rosuvastatin (CRESTOR) 5 MG tablet, Take 1 tablet (5 mg total) by mouth daily., Disp: 30 tablet, Rfl: 5 .  sucralfate (CARAFATE) 1 g tablet, Take 1 tablet (1 g total) by mouth 4 (four) times daily -  with meals and at bedtime., Disp: 120 tablet, Rfl: 0 .  tamsulosin (FLOMAX) 0.4 MG CAPS capsule, Take 1 capsule (0.4 mg total) by mouth daily., Disp: 30 capsule, Rfl: 0 .  warfarin (COUMADIN) 5 MG tablet, Take 1 tablet (5 mg total) by mouth daily., Disp: 100 tablet, Rfl: 5   Allergies  Allergen Reactions  . No Known Allergies     Review of Systems   Review of Systems  Constitutional: Negative for chills and fever.  HENT: Negative for congestion.   Eyes: Negative for blurred vision.  Respiratory: Negative for cough and wheezing.   Cardiovascular: Negative for chest pain, palpitations and leg swelling.  Gastrointestinal: Negative for abdominal pain, blood in stool, constipation, diarrhea, heartburn, melena, nausea and vomiting.  Genitourinary: Negative for dysuria and frequency.  Musculoskeletal: Positive for myalgias.  Skin: Negative for rash.  Neurological: Negative for loss of consciousness and headaches.  Psychiatric/Behavioral: Negative for depression and suicidal ideas. The patient is not nervous/anxious.     Vitals:   Vitals:   07/28/16 1450  BP: 128/82  Pulse: 68  Temp: 97.9 F (36.6 C)  TempSrc: Oral  SpO2: 96%  Weight: 186 lb 6.4 oz (84.6 kg)      Body mass index is 27.53 kg/m.   Physical Exam:    Physical Exam  Constitutional: He is oriented to person, place, and time. He appears well-developed and well-nourished. No distress.  HENT:  Head: Normocephalic and atraumatic.  Eyes: Conjunctivae and EOM are normal. Pupils are equal, round, and reactive to light.  Neck: Neck supple. No thyromegaly present.  Cardiovascular: Normal rate and regular rhythm.   Pulmonary/Chest: Effort normal and breath sounds normal.  Abdominal: Soft. Bowel sounds are normal.  Neurological: He is alert and oriented to person, place, and time.  Skin: Skin is warm and dry.  Psychiatric: He has a normal mood and affect. His behavior is normal. Judgment and thought content normal.     Results for orders placed or performed in visit on 07/28/16  CBC  Result Value Ref Range   WBC 11.2 (H) 3.8 - 10.8 K/uL   RBC 4.56 4.20 - 5.80 MIL/uL   Hemoglobin 11.2 (L) 13.2 - 17.1 g/dL   HCT 36.4 (L) 38.5 - 50.0 %   MCV 79.8 (L) 80.0 - 100.0 fL  MCH 24.6 (L) 27.0 - 33.0 pg   MCHC 30.8 (L) 32.0 - 36.0 g/dL   RDW 31.2 (H) 11.0 - 15.0 %   Platelets 345 140 - 400 K/uL   MPV 9.3 7.5 - 12.5 fL  Ferritin  Result Value Ref Range   Ferritin 192 20 - 380 ng/mL  POCT INR  Result Value Ref Range   INR 2.5     Assessment and Plan:    Aubra was seen today for follow-up.  Diagnoses and all orders for this visit:  Anticoagulated on Coumadin Comments: INR 2.5 today. [place]. Continue current dosing at 5 mg po daily. Recheck in one week. Orders: -     POCT INR  Iron deficiency anemia due to chronic blood loss Comments: Stable.  Orders: -     CBC -     Ferritin  Smoker Comments: Encouraged continued cessation - down to 1/2 ppd. On Chantix.   Gastroesophageal reflux disease without esophagitis Comments: Improved now that he is not taking NSAIDs. He would like to stop Carafate. Expect INR to increase slightly this week with cessation.   . Reviewed  expectations re: course of current medical issues. . Discussed self-management of symptoms. . Outlined signs and symptoms indicating need for more acute intervention. . Patient verbalized understanding and all questions were answered. . See orders for this visit as documented in the electronic medical record. . Patient received an After Visit Summary.   Water quality scientist, [place], acting as scribe for Dr. Juleen China.  Briscoe Deutscher, [place], [place] 07/28/2016  Follow-up: No Follow-up on file.

## 2016-07-28 NOTE — Progress Notes (Signed)
Pre visit review using our clinic review tool, if applicable. No additional management support is needed unless otherwise documented below in the visit note. 

## 2016-07-28 NOTE — Patient Instructions (Signed)
It was so nice to see you today!  Call or email if you have any concerns.

## 2016-07-28 NOTE — Progress Notes (Signed)
Patient name: [name] MRN: [id number] DOB: 04/27/1966 Sex: male  REASON FOR VISIT: post-op drain removal  HPI: [name] is a 51 y.o. male who is s/p right common femoral artery exposure and bovine patch angioplasty, thrombectomy and angioplasty of right common iliac artery on 07/17/16 for lifestyle limiting right leg claudication. The patient previously underwent endovascular recannulization of a chronically occluded right common iliac artery with a common iliac artery stent that occluded slightly after 1 month. While undergoing cardiac workup for a femoral femoral bypass, the patient was found to have antiphospholipid syndrome.  The patient had an uncomplicated postoperative course initially. He was restarted on Coumadin and remained in the hospital while waiting for his INR to became therapeutic. He did develop a right groin mass on postop day 6 that grew substantially overnight on postop day 8. He was given 2 units of FFP and taken to the operating room on 07/25/2016 for evacuation of right groin hematoma. There was approximately 30 mL of hematoma drained from the right groin. The patch on the femoral artery was intact with viable tissue. A deep layer was closed over the patch and a drain was placed on top. He was discharged the following day. He was placed on Keflex 500 mg by mouth 4 times a day for 10 days as a precaution due to drain placement.  He reports resolution of his right buttock claudication.    Current Outpatient Prescriptions  Medication Sig Dispense Refill  . cephALEXin (KEFLEX) 500 MG capsule Take 1 capsule (500 mg total) by mouth 4 (four) times daily. 40 capsule 0  . CHANTIX CONTINUING MONTH PAK 1 MG tablet Take 1 mg by mouth 2 (two) times daily.  1  . docusate sodium (COLACE) 100 MG capsule Take 1 capsule (100 mg total) by mouth 2 (two) times daily. 10 capsule 0  . DULoxetine (CYMBALTA) 30 MG capsule Take one 30 mg tablet by mouth once a day for the first week.  Then increase to two 30 mg tablets ( total 60mg ) by mouth once daily. 60 capsule 3  . esomeprazole (NEXIUM) 20 MG capsule Take 20 mg by mouth daily.     . ferrous sulfate 325 (65 FE) MG tablet Take 1 tablet (325 mg total) by mouth daily with breakfast. 30 tablet 0  . gabapentin (NEURONTIN) 100 MG capsule Take 2 capsules (200 mg total) by mouth 3 (three) times daily. 180 capsule 3  . mupirocin ointment (BACTROBAN) 2 % Apply 1 application topically 2 (two) times daily.  0  . oxyCODONE-acetaminophen (PERCOCET/ROXICET) 5-325 MG tablet Take 1-2 tablets by mouth every 4 (four) hours as needed for severe pain. 30 tablet 0  . rosuvastatin (CRESTOR) 5 MG tablet Take 1 tablet (5 mg total) by mouth daily. 30 tablet 5  . sucralfate (CARAFATE) 1 g tablet Take 1 tablet (1 g total) by mouth 4 (four) times daily -  with meals and at bedtime. 120 tablet 0  . tamsulosin (FLOMAX) 0.4 MG CAPS capsule Take 1 capsule (0.4 mg total) by mouth daily. 30 capsule 0  . warfarin (COUMADIN) 5 MG tablet Take 1 tablet (5 mg total) by mouth daily. 100 tablet 5   No current facility-administered medications for this visit.     REVIEW OF SYSTEMS:  [X]  denotes positive finding, [ ]  denotes negative finding Cardiac  Comments:  Chest pain or chest pressure:    Shortness of breath upon exertion:    Short of breath when lying flat:  Irregular heart rhythm:    Constitutional    Fever or chills:      PHYSICAL EXAM: Vitals:   07/28/16 1400  BP: 130/84  Pulse: 75  Resp: 18  Temp: 97.7 F (36.5 C)  TempSrc: Oral  SpO2: 98%  Weight: 186 lb (84.4 kg)  Height: 5\' 9"  (1.753 m)    GENERAL: The patient is a well-nourished male, in no acute distress. The vital signs are documented above. VASCULAR:  Right groin incision clean and intact. There is minimal swelling at the groin. Scar ridge around incision. No pulsatility. No erythema. Right JP drain with ~30 cc serosanguinous drainage. Palpable right PT pulse.    MEDICAL  ISSUES: S/p right common femoral bovine patch angioplasty and thrombectomy and angioplasty right common iliac artery and evacuation of hematoma right groin.   -JP drain removed today. There was approximately 30 cc of serosanguinous output. Small amount of swelling at right groin present.  -Complete 10 day course of keflex due to re-exposure and drain placement.  -Counseled on smoking cessation -F/u on 08/11/16 with Dr. Bridgett Larsson.  Virgina Jock, PA-C [facility] MD: Bridgett Larsson

## 2016-07-31 ENCOUNTER — Other Ambulatory Visit: Payer: Self-pay | Admitting: *Deleted

## 2016-07-31 ENCOUNTER — Encounter: Payer: Self-pay | Admitting: *Deleted

## 2016-07-31 ENCOUNTER — Encounter: Payer: Self-pay | Admitting: Vascular Surgery

## 2016-07-31 NOTE — Patient Outreach (Addendum)
[facility]) Care Management  07/31/2016  [name] 01-Feb-1966 [id number]  Subjective: Telephone call to patient's mobile number times 3, home number,   spoke with patient, and HIPAA verified.  Discussed [facility] Care Management Transition of care follow up, patient voiced understanding, and is in agreement to complete follow up.  Patient states he is doing well, had hospital follow up appointment with primary MD on 07/28/16, and appointment went good.   States all specialist follow appointments have been scheduled.  Patient asked if any discounts or financial assistance available to assist with hospital bills.  States he is not eligible for family medical leave act ( FMLA) at this time because he has not been working.   States he does not have the hospital indemnity supplemental insurance. States he and wife makes too much money to qualify for Medicaid.  States he has already set up a payment plan with [facility] for medical bills.  RNCM advised patient of another resource that he can ask his wife (who is the [employer] employee) to look into the [employer] C[employer] for [employer], voices understanding, and states he will ask his wife to follow up to see if they would be eligible for assistance through this fund.   Patient in agreement with RNCM discussing hs financial situation with [facility] Social Worker.   RNCM advised patient,  RNCM or Social Worker will call patient back only if any additional resources identified and patient was in agreement with no follow up call needed at this time.    Patient states he does not have any transition of care, care coordination, disease management, disease monitoring, transportation, community resource, or pharmacy needs at this time. States he is very appreciative of follow up call and is in agreement to receive [facility] Management information. Telephone call to [employer] (SW) at [facility] Management, discussed patient's financial situation, is in  agreement with resources offered by [facility], and has no other resources to offer at this time.   Outpatient Encounter Prescriptions as of 07/31/2016  Medication Sig  . cephALEXin (KEFLEX) 500 MG capsule Take 1 capsule (500 mg total) by mouth 4 (four) times daily.  Hendricks Limes CONTINUING MONTH PAK 1 MG tablet Take 1 mg by mouth 2 (two) times daily.  Marland Kitchen docusate sodium (COLACE) 100 MG capsule Take 1 capsule (100 mg total) by mouth 2 (two) times daily.  Marland Kitchen esomeprazole (NEXIUM) 20 MG capsule Take 20 mg by mouth daily.   Marland Kitchen gabapentin (NEURONTIN) 100 MG capsule Take 2 capsules (200 mg total) by mouth 3 (three) times daily.  Marland Kitchen oxyCODONE-acetaminophen (PERCOCET/ROXICET) 5-325 MG tablet Take 1-2 tablets by mouth every 4 (four) hours as needed for severe pain.  . rosuvastatin (CRESTOR) 5 MG tablet Take 1 tablet (5 mg total) by mouth daily.  . tamsulosin (FLOMAX) 0.4 MG CAPS capsule Take 1 capsule (0.4 mg total) by mouth daily.  Marland Kitchen warfarin (COUMADIN) 5 MG tablet Take 1 tablet (5 mg total) by mouth daily.   No facility-administered encounter medications on file as of 07/31/2016.     Objective: Per chart review, patient hospitalized 07/17/16 - 07/26/16 for Peripheral Vascular Disease.  Status post  Right common femoral artery exposure and bovine patch angioplasty, Open cannulation of common femoral artery, Aortogram, Thrombectomy of right common iliac artery, and Angioplasty right common iliac artery on 07/17/16.  Status post Evacuation of right groin hematoma on 07/25/16.  Patient also hospitalized  07/06/16 -07/08/16 for acute anemia.  Patient  has a history of: Antiphospholipid antibody syndrome, peripheral arterial disease (PAD), and GERD.    Assessment:  Received [facility] Transition of care referral on 07/07/16.  Transition of care follow up completed times 2, no care management needs at this time, and will proceed with case closure.  Plan: RNCM will send patient successful outreach letter, [facility] pamphlet, and  magnet. RNCM will send case closure due to follow up completed / no care management needs request to [facility] [place] [facility].    Charlei Ramsaran H. Annia Friendly, BSN, [place] Management [facility] Telephonic CM Phone: [phone] Fax: [phone]

## 2016-08-03 ENCOUNTER — Telehealth: Payer: Self-pay

## 2016-08-03 DIAGNOSIS — Z7901 Long term (current) use of anticoagulants: Secondary | ICD-10-CM

## 2016-08-03 NOTE — Telephone Encounter (Signed)
Pt on lab schedule for 08/04/16. This is for INR check only, correct? Okay to place order for POC PT-INR? Thanks.

## 2016-08-03 NOTE — Telephone Encounter (Signed)
Correct

## 2016-08-04 ENCOUNTER — Other Ambulatory Visit (INDEPENDENT_AMBULATORY_CARE_PROVIDER_SITE_OTHER): Payer: 59

## 2016-08-04 DIAGNOSIS — Z5181 Encounter for therapeutic drug level monitoring: Secondary | ICD-10-CM

## 2016-08-04 DIAGNOSIS — Z7901 Long term (current) use of anticoagulants: Secondary | ICD-10-CM

## 2016-08-04 LAB — POCT INR: INR: 2

## 2016-08-07 MED FILL — ROSUVASTATIN CALCIUM 5 MG T: 5 | 30 days supply | Qty: 30 | Fill #1

## 2016-08-07 NOTE — Progress Notes (Signed)
    Postoperative Visit   History of Present Illness  [name] is a 51 y.o. year old male who presents for postoperative follow-up for:   1.  TE & PTA R CIA (07/17/16) 2.  Evac R groin hematoma (07/25/16)  The patient's wounds are healing.  The patient notes great improvement R lower extremity symptoms.  The patient is able to complete their activities of daily living.  The patient's current symptoms are: intermittent paraesthesia in R foot and "bruise" sensation in R buttock.   For VQI Use Only  PRE-ADM LIVING: Home  AMB STATUS: Ambulatory   Physical Examination  Vitals:   08/11/16 0957  BP: 127/84  Pulse: 68  Resp: 18  Temp: 97.4 F (36.3 C)    RLE: R groin c/d/I, small hematoma, faintly palpable DP and palpable PT  RLE arterial duplex (08/11/2016)  Bi-tri flow throughout except distal DP which has blunted flow  No frank thrombus visualized   Medical Decision Making  [name] is a 51 y.o. year old male who presents s/p TE and PTA R CIA, evac R groin hematoma   The patient's incisions are healing appropriately with resolution of pre-operative symptoms.  Unclear etiology of buttock "bruising" sensation.   Paraesthesia in R foot sound like re-perfusion injury, which should be self limiting.  I'm not certain the etiology of the abnormal distal DP duplex findings as there is a strong DP signal in the distal foot on doppler, suggesting that this distal DP stenosis vs occlusion vs anatomic variant is chronic. I discussed in depth with the patient the nature of atherosclerosis, and emphasized the importance of maximal medical management including strict control of blood pressure, blood glucose, and lipid levels, obtaining regular exercise, and cessation of smoking.  The patient is aware that without maximal medical management the underlying atherosclerotic disease process will progress, limiting the benefit of any interventions. The patient's surveillance  will included BLE ABI and aortoiliac duplex in 3 months. I emphasized the importance of routine surveillance of the patient's bypass, as the vascular surgery literature emphasize the improved patency possible with assisted primary patency procedures versus secondary patency procedures. The patient agrees to participate in their maximal medical care and routine surveillance.  Thank you for allowing [place] to participate in this patient's care.  William Barthel, MD, FACS [facility] [place] Office: [phone] Pager: [phone]

## 2016-08-08 ENCOUNTER — Other Ambulatory Visit: Payer: Self-pay

## 2016-08-08 DIAGNOSIS — Z7901 Long term (current) use of anticoagulants: Principal | ICD-10-CM

## 2016-08-08 DIAGNOSIS — Z5181 Encounter for therapeutic drug level monitoring: Secondary | ICD-10-CM

## 2016-08-10 ENCOUNTER — Other Ambulatory Visit: Payer: Self-pay

## 2016-08-10 ENCOUNTER — Telehealth: Payer: Self-pay | Admitting: Family Medicine

## 2016-08-10 MED ORDER — GABAPENTIN 100 MG PO CAPS: 200.0000 mg | ORAL_CAPSULE | Freq: Three times a day (TID) | ORAL | 3 refills | Status: DC

## 2016-08-10 MED FILL — GABAPENTIN 100 MG CAPSULE: 100 | 30 days supply | Qty: 180 | Fill #0

## 2016-08-10 NOTE — Telephone Encounter (Signed)
Refill has been sent.  °

## 2016-08-10 NOTE — Telephone Encounter (Signed)
Patient is requesting a refill of gabapentin to [facility]. States in [place] that he has 2 refills, but [facility] does not acknowledge that he does.

## 2016-08-11 ENCOUNTER — Other Ambulatory Visit: Payer: Self-pay

## 2016-08-11 ENCOUNTER — Ambulatory Visit (INDEPENDENT_AMBULATORY_CARE_PROVIDER_SITE_OTHER): Payer: Self-pay | Admitting: Vascular Surgery

## 2016-08-11 ENCOUNTER — Ambulatory Visit (HOSPITAL_COMMUNITY)
Admission: RE | Admit: 2016-08-11 | Discharge: 2016-08-11 | Disposition: A | Discharge status: Home / Self Care | Payer: 59 | Source: Ambulatory Visit | Attending: Vascular Surgery | Admitting: Vascular Surgery

## 2016-08-11 ENCOUNTER — Other Ambulatory Visit (INDEPENDENT_AMBULATORY_CARE_PROVIDER_SITE_OTHER): Payer: 59

## 2016-08-11 ENCOUNTER — Encounter: Payer: Self-pay | Admitting: Vascular Surgery

## 2016-08-11 VITALS — BP 127/84 | HR 68 | Temp 97.4°F | Resp 18 | Ht 69.0 in | Wt 193.0 lb

## 2016-08-11 DIAGNOSIS — I70211 Atherosclerosis of native arteries of extremities with intermittent claudication, right leg: Secondary | ICD-10-CM | POA: Diagnosis not present

## 2016-08-11 DIAGNOSIS — Z5181 Encounter for therapeutic drug level monitoring: Secondary | ICD-10-CM | POA: Diagnosis not present

## 2016-08-11 DIAGNOSIS — Z7901 Long term (current) use of anticoagulants: Secondary | ICD-10-CM | POA: Diagnosis not present

## 2016-08-11 LAB — POCT INR: INR: 2.8

## 2016-08-16 MED FILL — CHANTIX 1 MG CONT MONTH BOX: 1 | 28 days supply | Qty: 56 | Fill #1

## 2016-08-16 NOTE — Addendum Note (Signed)
Addended by: Lianne Cure A on: 08/16/2016 09:13 AM   Modules accepted: Orders

## 2016-08-17 ENCOUNTER — Other Ambulatory Visit (INDEPENDENT_AMBULATORY_CARE_PROVIDER_SITE_OTHER): Payer: 59

## 2016-08-17 DIAGNOSIS — Z5181 Encounter for therapeutic drug level monitoring: Secondary | ICD-10-CM | POA: Diagnosis not present

## 2016-08-17 DIAGNOSIS — Z7901 Long term (current) use of anticoagulants: Secondary | ICD-10-CM | POA: Diagnosis not present

## 2016-08-17 LAB — POCT INR: INR: 2.1

## 2016-08-18 ENCOUNTER — Other Ambulatory Visit: Payer: 59

## 2016-08-18 ENCOUNTER — Encounter: Payer: Self-pay | Admitting: Family Medicine

## 2016-08-18 DIAGNOSIS — R0602 Shortness of breath: Secondary | ICD-10-CM

## 2016-08-25 ENCOUNTER — Other Ambulatory Visit (INDEPENDENT_AMBULATORY_CARE_PROVIDER_SITE_OTHER): Payer: 59

## 2016-08-25 DIAGNOSIS — R0602 Shortness of breath: Secondary | ICD-10-CM | POA: Diagnosis not present

## 2016-08-25 DIAGNOSIS — Z5181 Encounter for therapeutic drug level monitoring: Secondary | ICD-10-CM

## 2016-08-25 DIAGNOSIS — Z7901 Long term (current) use of anticoagulants: Secondary | ICD-10-CM | POA: Diagnosis not present

## 2016-08-25 LAB — CBC
HCT: 39.8 % (ref 38.5–50.0)
Hemoglobin: 12.8 g/dL — ABNORMAL LOW (ref 13.2–17.1)
MCH: 26.9 pg — ABNORMAL LOW (ref 27.0–33.0)
MCHC: 32.2 g/dL (ref 32.0–36.0)
MCV: 83.6 fL (ref 80.0–100.0)
MPV: 9.5 fL (ref 7.5–12.5)
Platelets: 227 10*3/uL (ref 140–400)
RBC: 4.76 MIL/uL (ref 4.20–5.80)
WBC: 9.8 10*3/uL (ref 3.8–10.8)

## 2016-08-25 LAB — POCT INR: INR: 2

## 2016-08-25 NOTE — Addendum Note (Signed)
Addended by: Loraine Grip on: 08/25/2016 03:53 PM   Modules accepted: Orders

## 2016-08-25 NOTE — Addendum Note (Signed)
Addended by: Loraine Grip on: 08/25/2016 03:55 PM   Modules accepted: Orders

## 2016-08-31 ENCOUNTER — Encounter: Payer: Self-pay | Admitting: Family Medicine

## 2016-09-01 ENCOUNTER — Encounter: Payer: Self-pay | Admitting: Physician Assistant

## 2016-09-01 ENCOUNTER — Encounter: Payer: Self-pay | Admitting: Family Medicine

## 2016-09-01 ENCOUNTER — Ambulatory Visit (INDEPENDENT_AMBULATORY_CARE_PROVIDER_SITE_OTHER): Payer: 59 | Admitting: Physician Assistant

## 2016-09-01 ENCOUNTER — Other Ambulatory Visit: Payer: 59

## 2016-09-01 VITALS — BP 150/96 | HR 92 | Temp 98.1°F | Ht 69.0 in | Wt 188.5 lb

## 2016-09-01 DIAGNOSIS — Z5181 Encounter for therapeutic drug level monitoring: Secondary | ICD-10-CM | POA: Diagnosis not present

## 2016-09-01 DIAGNOSIS — M545 Low back pain, unspecified: Secondary | ICD-10-CM

## 2016-09-01 DIAGNOSIS — Z7901 Long term (current) use of anticoagulants: Secondary | ICD-10-CM | POA: Diagnosis not present

## 2016-09-01 LAB — POCT INR: INR: 4.8

## 2016-09-01 MED ORDER — METHYLPREDNISOLONE 4 MG PO TBPK: ORAL_TABLET | ORAL | 0 refills | Status: DC

## 2016-09-01 MED FILL — METHYLPREDNISOLONE 4 MG TAB: 4 | 6 days supply | Qty: 21 | Fill #0

## 2016-09-01 NOTE — Progress Notes (Signed)
[name] is a 51 y.o. male here for Lower Back pain.  I acted as a Education administrator for [employer], PA-C Anselmo Pickler, LPN  History of Present Illness:   Chief Complaint  Patient presents with  . Back Pain    x 2 days    HPI   Back pain -- patient has a significant history of back pain. He reports taht two days ago he was reaching for something and he felt like he pulled something in his mid to lower L back. He has been taking Tylenol without relief. He has been told by his PCP to not take NSAIDs secondary to his Coumadin use. Denies radiation of pain, loss of bowel/bladder control, fevers.  INR check -- patient is also here for scheduled INR check today. He has been taking his Coumadin per orders. Denies any bleeding or bruising episodes.  PMHx, SurgHx, SocialHx, Medications, and Allergies were reviewed in the Visit Navigator and updated as appropriate.  Current Medications:   Current Outpatient Prescriptions:  .  esomeprazole (NEXIUM) 20 MG capsule, Take 20 mg by mouth daily. , Disp: , Rfl:  .  gabapentin (NEURONTIN) 100 MG capsule, Take 2 capsules (200 mg total) by mouth 3 (three) times daily., Disp: 180 capsule, Rfl: 3 .  rosuvastatin (CRESTOR) 5 MG tablet, Take 1 tablet (5 mg total) by mouth daily., Disp: 30 tablet, Rfl: 5 .  warfarin (COUMADIN) 5 MG tablet, Take 1 tablet (5 mg total) by mouth daily., Disp: 100 tablet, Rfl: 5 .  CHANTIX CONTINUING MONTH PAK 1 MG tablet, Take 1 mg by mouth 2 (two) times daily., Disp: , Rfl: 1 .  methylPREDNISolone (MEDROL DOSEPAK) 4 MG TBPK tablet, 6-5-4-3-2-1-off, Disp: 21 tablet, Rfl: 0   Review of Systems:   Review of Systems  Constitutional: Negative.   Genitourinary: Negative.   Musculoskeletal: Positive for back pain.       Lower area x 2 days  Neurological: Positive for headaches.    Vitals:   Vitals:   09/01/16 1527 09/01/16 1549  BP: (!) 142/90 (!) 150/96  Pulse: 92   Temp: 98.1 F (36.7 C)   TempSrc: Oral   SpO2:  96%   Weight: 188 lb 8 oz (85.5 kg)   Height: 5\' 9"  (1.753 m)      Body mass index is 27.84 kg/m.  Physical Exam:   Physical Exam  Constitutional: He appears well-developed. He is cooperative.  Non-toxic appearance. He does not have a sickly appearance. He does not appear ill. No distress.  Cardiovascular: Normal rate, regular rhythm, S1 normal, S2 normal, normal heart sounds and normal pulses.   No LE edema  Pulmonary/Chest: Effort normal and breath sounds normal.  Musculoskeletal:       Lumbar back: He exhibits no bony tenderness and no deformity.       Back:  Neurological: He is alert.  Nursing note and vitals reviewed.   Results for orders placed or performed in visit on 09/01/16  POCT INR  Result Value Ref Range   INR 4.8       Assessment and Plan:    [name] was seen today for back pain.  Diagnoses and all orders for this visit:  Anticoagulated on Coumadin INR today is 4.8 Hold coumadin for two days. Resume at 5mg  on Sunday and re-check INR on Monday prior to next dose. Advised patient to go to the ER if he develops any bruising or bleeding.  Acute left-sided low back pain without sciatica Medrol dose  pack per orders. Also consider [place] lidocaine topical patch or ice. If pain is not improved, follow-up with PCP or Dr. Charlann Boxer in sports medicine. If patient develops any bowel/bladder incontinence, he was advised to go to the ER.   Other orders -     methylPREDNISolone (MEDROL DOSEPAK) 4 MG TBPK tablet; 6-5-4-3-2-1-off   . Reviewed expectations re: course of current medical issues. . Discussed self-management of symptoms. . Outlined signs and symptoms indicating need for more acute intervention. . Patient verbalized understanding and all questions were answered. . See orders for this visit as documented in the electronic medical record. . Patient received an After-Visit Summary.  CMA or LPN served as scribe during this visit. History, Physical, and Plan  performed by medical provider. Documentation and orders reviewed and attested to.  Inda Coke, PA-C

## 2016-09-01 NOTE — Progress Notes (Signed)
INR supratherapeutic 09/01/2016. Patient instructed to hold Coumadin x 2 days, resume regular 5 mg dose on Sunday, recheck Monday.  Lab Results   Component Value Date INSTRUCTIONS ON DATE OF RESULT (Patient taking 5 mg pills)   INR 4.8 09/01/2016    INR 2.0 08/25/2016 Add 1/2 on 2 days   INR 2.1 08/17/2016 Add 1/2 on 1 day   INR 2.8 08/11/2016 No changes   INR 2.0 08/04/2016 Add 1/2 on 2 days        * note: read from bottom to top   Indication: TE & PTA R CIA (07/17/16)  Goal: 2.5-3, antiphospholipid antibodies.  Briscoe Deutscher, DO [place]

## 2016-09-01 NOTE — Patient Instructions (Addendum)
It was great meeting you today!  Take the oral steroid as prescribed. Take with food.  If your back pain changes in any way, please let us know. If you develop any issues with bowel or bladder incontinence, please go to the emergency room.  Hold coumadin today and tomorrow. Take 5 mg Coumadin on Sunday. Please return to clinic on Monday for repeat INR check prior to taking another dose. Any signs or symptoms of bleeding over the weekend, please go to the emergency room.

## 2016-09-01 NOTE — Progress Notes (Signed)
Pre visit review using our clinic review tool, if applicable. No additional management support is needed unless otherwise documented below in the visit note. 

## 2016-09-04 ENCOUNTER — Encounter: Payer: Self-pay | Admitting: Family Medicine

## 2016-09-04 ENCOUNTER — Other Ambulatory Visit (INDEPENDENT_AMBULATORY_CARE_PROVIDER_SITE_OTHER): Payer: 59

## 2016-09-04 DIAGNOSIS — Z7901 Long term (current) use of anticoagulants: Secondary | ICD-10-CM | POA: Diagnosis not present

## 2016-09-04 DIAGNOSIS — Z5181 Encounter for therapeutic drug level monitoring: Secondary | ICD-10-CM

## 2016-09-04 LAB — POCT INR: INR: 1.3

## 2016-09-04 MED FILL — ROSUVASTATIN CALCIUM 5 MG T: 5 | 30 days supply | Qty: 30 | Fill #2

## 2016-09-05 ENCOUNTER — Other Ambulatory Visit: Payer: Self-pay

## 2016-09-05 DIAGNOSIS — Z5181 Encounter for therapeutic drug level monitoring: Secondary | ICD-10-CM

## 2016-09-05 DIAGNOSIS — Z7901 Long term (current) use of anticoagulants: Principal | ICD-10-CM

## 2016-09-08 ENCOUNTER — Other Ambulatory Visit (INDEPENDENT_AMBULATORY_CARE_PROVIDER_SITE_OTHER): Payer: 59

## 2016-09-08 DIAGNOSIS — Z5181 Encounter for therapeutic drug level monitoring: Secondary | ICD-10-CM

## 2016-09-08 DIAGNOSIS — Z7901 Long term (current) use of anticoagulants: Secondary | ICD-10-CM

## 2016-09-08 LAB — POCT INR: INR: 2.2

## 2016-09-10 ENCOUNTER — Encounter: Payer: Self-pay | Admitting: Vascular Surgery

## 2016-09-11 ENCOUNTER — Encounter: Payer: Self-pay | Admitting: Hematology

## 2016-09-11 ENCOUNTER — Other Ambulatory Visit (HOSPITAL_BASED_OUTPATIENT_CLINIC_OR_DEPARTMENT_OTHER): Payer: 59

## 2016-09-11 ENCOUNTER — Encounter: Payer: Self-pay | Admitting: Family Medicine

## 2016-09-11 ENCOUNTER — Ambulatory Visit (HOSPITAL_BASED_OUTPATIENT_CLINIC_OR_DEPARTMENT_OTHER): Payer: 59 | Admitting: Hematology

## 2016-09-11 VITALS — BP 120/77 | HR 79 | Temp 97.8°F | Resp 18 | Wt 189.4 lb

## 2016-09-11 DIAGNOSIS — Z86718 Personal history of other venous thrombosis and embolism: Secondary | ICD-10-CM

## 2016-09-11 DIAGNOSIS — D500 Iron deficiency anemia secondary to blood loss (chronic): Secondary | ICD-10-CM | POA: Diagnosis not present

## 2016-09-11 DIAGNOSIS — K625 Hemorrhage of anus and rectum: Secondary | ICD-10-CM

## 2016-09-11 DIAGNOSIS — R760 Raised antibody titer: Secondary | ICD-10-CM | POA: Diagnosis not present

## 2016-09-11 DIAGNOSIS — I745 Embolism and thrombosis of iliac artery: Secondary | ICD-10-CM

## 2016-09-11 DIAGNOSIS — Z7901 Long term (current) use of anticoagulants: Secondary | ICD-10-CM | POA: Diagnosis not present

## 2016-09-11 LAB — COMPREHENSIVE METABOLIC PANEL
ALBUMIN: 3.9 g/dL (ref 3.5–5.0)
ALK PHOS: 95 U/L (ref 40–150)
ALT: 30 U/L (ref 0–55)
AST: 18 U/L (ref 5–34)
Anion Gap: 9 mEq/L (ref 3–11)
BILIRUBIN TOTAL: 0.7 mg/dL (ref 0.20–1.20)
BUN: 11.5 mg/dL (ref 7.0–26.0)
CO2: 26 meq/L (ref 22–29)
Calcium: 9.6 mg/dL (ref 8.4–10.4)
Chloride: 104 mEq/L (ref 98–109)
Creatinine: 1 mg/dL (ref 0.7–1.3)
GLUCOSE: 87 mg/dL (ref 70–140)
Potassium: 4.3 mEq/L (ref 3.5–5.1)
Sodium: 139 mEq/L (ref 136–145)
TOTAL PROTEIN: 7.3 g/dL (ref 6.4–8.3)

## 2016-09-11 LAB — CBC & DIFF AND RETIC
BASO%: 0.4 % (ref 0.0–2.0)
BASOS ABS: 0.1 10*3/uL (ref 0.0–0.1)
EOS%: 3.7 % (ref 0.0–7.0)
Eosinophils Absolute: 0.5 10*3/uL (ref 0.0–0.5)
HEMATOCRIT: 40.1 % (ref 38.4–49.9)
HGB: 13.1 g/dL (ref 13.0–17.1)
Immature Retic Fract: 18.4 % — ABNORMAL HIGH (ref 3.00–10.60)
LYMPH%: 24.3 % (ref 14.0–49.0)
MCH: 27.8 pg (ref 27.2–33.4)
MCHC: 32.7 g/dL (ref 32.0–36.0)
MCV: 85.1 fL (ref 79.3–98.0)
MONO#: 1 10*3/uL — ABNORMAL HIGH (ref 0.1–0.9)
MONO%: 7.2 % (ref 0.0–14.0)
NEUT#: 8.8 10*3/uL — ABNORMAL HIGH (ref 1.5–6.5)
NEUT%: 64.4 % (ref 39.0–75.0)
PLATELETS: 269 10*3/uL (ref 140–400)
RBC: 4.71 10*6/uL (ref 4.20–5.82)
RDW: 23.2 % — AB (ref 11.0–14.6)
Retic %: 1.5 % (ref 0.80–1.80)
Retic Ct Abs: 70.65 10*3/uL (ref 34.80–93.90)
WBC: 13.7 10*3/uL — ABNORMAL HIGH (ref 4.0–10.3)
lymph#: 3.3 10*3/uL (ref 0.9–3.3)

## 2016-09-11 LAB — IRON AND TIBC
%SAT: 4 % — ABNORMAL LOW (ref 20–55)
Iron: 22 ug/dL — ABNORMAL LOW (ref 42–163)
TIBC: 483 ug/dL — ABNORMAL HIGH (ref 202–409)
UIBC: 461 ug/dL — ABNORMAL HIGH (ref 117–376)

## 2016-09-11 LAB — FERRITIN: Ferritin: 11 ng/ml — ABNORMAL LOW (ref 22–316)

## 2016-09-11 NOTE — Progress Notes (Signed)
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  HISTORY OF PRESENTING ILLNESS:   [name] is a wonderful 51 y.o. 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  Patient has a history of smoking 1-1/2 packs per day for about 40 years, dyslipidemia but no other known significant chronic medical comorbidities. He has not had a primary care physician and routine screenings.  He presented to the vascular surgeon in September/October 2017 with claudication of his lower extremities especially cramping discomfort in his buttocks and right thigh and was noted to have a reduced ABI of 0.8 on the right side. 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 He underwent right common iliac artery stenting with significant improvement in his right lower extremity claudication symptoms. He was apparently placed on Plavix at the time.  He presented again in late December 2017 with acute worsening of his right lower extremity pain and had repeat angiography on 05/11/2016 which showed the previously placed common iliac stent was occluded and this was retreated. No obvious etiology was noted.  Patient was started on aspirin in addition to Plavix. Concern for a clotting  disorder versus incomplete stent expansion versus edge dissection were entertained. Patient had anticardiolipin antibody and beta-2 glycoprotein antibody testing. He was noted to have a significantly elevated beta-2 glycoprotein IgG that was concerning for antiphospholipid antibody syndrome.  Patient was tentatively plan to have arterial bypass surgery on 07/11/2016 with Dr. Bridgett Larsson. He has been seen by Dr. Debara Pickett and had a nuclear cardiac stress test which showed a slightly reduced ejection fraction of 44% but negative for ischemia. Repeat echo showed normal ejection fraction.  Patient has had some mild anemia with hemoglobin of 10.5 when he first had his cardiac angiogram of 10.2 on the subsequent angiogram. He does not report any overt bleeding on his aspirin plus Plavix. Does note some mild hemorrhoidal bleeding on and off for the last 1 year.   INTERVAL HISTORY  [name] is here for his scheduled followup for antiphospholipid antibody syndrome. He has had right iliac artery stent thrombectomy without needing a vascular bypass. Continues to be on Coumadin for anticoagulation. Notes that his INR was subtherapeutic for a few days. Notes some pain in his right foot. Has an appointment with Dr. Bridgett Larsson to evaluate this. His hemoglobin has normalized to 13.1 after IV iron replacement for severe iron deficiency anemia. He still appears to have some intermittent rectal bleeding likely hemorrhoidal. Noted to have internal Hemorrhoids on his recent colonoscopy. Hemoglobin is stable suggesting known significant amounts of bleeding. However since he is on Coumadin will need to have this worked up addressed further.    MEDICAL HISTORY:  Past Medical History:  Diagnosis Date  . Anemia   . Anxiety   . Blood dyscrasia    clotting  disorder  . Dyspnea    w/ exertion    hx pleuralsy yrs  ago  . GERD (gastroesophageal reflux disease)   . Hyperlipidemia   . PAD (peripheral artery disease) ([place])   . Smoker      SURGICAL HISTORY: Past Surgical History:  Procedure Laterality Date  . ANGIOPLASTY ILLIAC ARTERY Right 07/17/2016   Procedure: ANGIOPLASTY RIGHT COMMON ILIAC ARTERY;  Surgeon: Conrad [place], MD;  Location: [facility];  Service: Vascular;  Laterality: Right;  . COLONOSCOPY N/A 07/08/2016   Procedure: COLONOSCOPY;  Surgeon: Clarene Essex, MD;  Location: [facility];  Service: Endoscopy;  Laterality: N/A;  . ESOPHAGOGASTRODUODENOSCOPY N/A 07/08/2016   Procedure: ESOPHAGOGASTRODUODENOSCOPY (EGD);  Surgeon: Clarene Essex, MD;  Location: [facility];  Service: Endoscopy;  Laterality: N/A;  . GROIN DEBRIDEMENT Right 07/25/2016   Procedure: EVACUATION HEMATOMA RIGHT GROIN;  Surgeon: Waynetta Sandy, MD;  Location: [facility];  Service: Vascular;  Laterality: Right;  . INTRAOPERATIVE ARTERIOGRAM Right 07/17/2016   Procedure: INTRA OPERATIVE ANGIOGRAM OF RIGHT COMMON ILIAC ARTERY;  Surgeon: Conrad [place], MD;  Location: [facility];  Service: Vascular;  Laterality: Right;  . KNEE SURGERY    . neck surger    . NECK SURGERY    . PATCH ANGIOPLASTY Right 07/17/2016   Procedure: PATCH ANGIOPLASTY RIGHT FEMORAL ARTERY USING Rueben Bash BIOLOGIC PATCH;  Surgeon: Conrad [place], MD;  Location: [facility];  Service: Vascular;  Laterality: Right;  . PERIPHERAL VASCULAR CATHETERIZATION N/A 02/24/2016   Procedure: Abdominal Aortogram w/Lower Extremity;  Surgeon: Conrad [place], MD;  Location: [facility];  Service: Cardiovascular;  Laterality: N/A;  . PERIPHERAL VASCULAR CATHETERIZATION Right 02/24/2016   Procedure: Peripheral Vascular Intervention;  Surgeon: Conrad [place]ez, MD;  Location: [facility];  Service: Cardiovascular;  Laterality: Right;  Common iliac  . PERIPHERAL VASCULAR CATHETERIZATION N/A 05/11/2016   Procedure: Abdominal Aortogram;  Surgeon: Conrad [place], MD;  Location: [place] [facility];  Service: Cardiovascular;  Laterality: N/A;  . PERIPHERAL VASCULAR CATHETERIZATION N/A 05/11/2016   Procedure: Lower  Extremity Angiography;  Surgeon: Conrad [place], MD;  Location: [facility];  Service: Cardiovascular;  Laterality: N/A;  . THROMBECTOMY FEMORAL ARTERY Right 07/17/2016   Procedure: THROMBECTOMY RIGHT FEMORAL ARTERY;  Surgeon: Conrad , MD;  Location: [facility];  Service: Vascular;  Laterality: Right;    SOCIAL HISTORY: Social History   Social History  . Marital status: Married    Spouse name: N/A  . Number of children: N/A  . Years of education: N/A   Occupational History  . Not on file.   Social History Main Topics  . Smoking status: Current Every Day Smoker    Packs/day: 0.10    Types: Cigarettes, E-cigarettes  . Smokeless tobacco: Never Used     Comment: 1/2 pk per day.  Vaporizor has 0% nicotine.  Trying to quit  . Alcohol use No     Comment: hx  etoh  . Drug use: No  . Sexual activity: Yes   Other Topics Concern  . Not on file   Social History Narrative  . No narrative on file    FAMILY HISTORY: Family History  Problem Relation Age of Onset  . Cancer Father     Lung    ALLERGIES:  is allergic to cymbalta [duloxetine hcl] and no known allergies.  MEDICATIONS:  Current Outpatient Prescriptions  Medication Sig Dispense Refill  . esomeprazole (NEXIUM) 20 MG capsule Take 20 mg by mouth daily.     Marland Kitchen gabapentin (NEURONTIN) 100 MG capsule Take 2 capsules (200  mg total) by mouth 3 (three) times daily. 180 capsule 3  . rosuvastatin (CRESTOR) 5 MG tablet Take 1 tablet (5 mg total) by mouth daily. 30 tablet 5  . warfarin (COUMADIN) 5 MG tablet Take 1 tablet (5 mg total) by mouth daily. 100 tablet 5   No current facility-administered medications for this visit.     REVIEW OF SYSTEMS:    10 Point review of Systems was done is negative except as noted above.  PHYSICAL EXAMINATION: ECOG PERFORMANCE STATUS: 1 - Symptomatic but completely ambulatory  . Vitals:   09/11/16 1301  BP: 120/77  Pulse: 79  Resp: 18  Temp: 97.8 F (36.6 C)   Filed Weights    09/11/16 1301  Weight: 189 lb 6 oz (85.9 kg)   .Body mass index is 27.97 kg/m.  GENERAL:alert, in no acute distress and comfortable SKIN: skin color, texture, turgor are normal, no rashes or significant lesions EYES: normal, conjunctiva are pink and non-injected, sclera clear OROPHARYNX:no exudate, no erythema and lips, buccal mucosa, and tongue normal  NECK: supple, no JVD, thyroid normal size, non-tender, without nodularity LYMPH:  no palpable lymphadenopathy in the cervical, axillary or inguinal LUNGS: clear to auscultation with normal respiratory effort HEART: regular rate & rhythm,  no murmurs and no lower extremity edema ABDOMEN: abdomen soft, non-tender, normoactive bowel sounds  Musculoskeletal: Right lower extremity degrees posterior tibial and DP pulses. PSYCH: alert & oriented x 3 with fluent speech NEURO: no focal motor/sensory deficits  LABORATORY DATA:  I have reviewed the data as listed  . CBC Latest Ref Rng & Units 09/11/2016 08/25/2016 07/28/2016  WBC 4.0 - 10.3 10e3/uL 13.7(H) 9.8 11.2(H)  Hemoglobin 13.0 - 17.1 g/dL 13.1 12.8(L) 11.2(L)  Hematocrit 38.4 - 49.9 % 40.1 39.8 36.4(L)  Platelets 140 - 400 10e3/uL 269 227 345    . CMP Latest Ref Rng & Units 09/11/2016 07/18/2016 07/17/2016  Glucose 70 - 140 mg/dl 87 113(H) 101(H)  BUN 7.0 - 26.0 mg/dL 11.5 5(L) 8  Creatinine 0.7 - 1.3 mg/dL 1.0 0.94 0.80  Sodium 136 - 145 mEq/L 139 138 140  Potassium 3.5 - 5.1 mEq/L 4.3 3.9 4.1  Chloride 101 - 111 mmol/L - 104 106  CO2 22 - 29 mEq/L 26 27 -  Calcium 8.4 - 10.4 mg/dL 9.6 8.6(L) -  Total Protein 6.4 - 8.3 g/dL 7.3 - -  Total Bilirubin 0.20 - 1.20 mg/dL 0.70 - -  Alkaline Phos 40 - 150 U/L 95 - -  AST 5 - 34 U/L 18 - -  ALT 0 - 55 U/L 30 - -   Component     Latest Ref Rng & Units 06/20/2016         8:37 AM  Phosphatidylserine IgG Autoantibodies     <10 U/mL <10  Phosphatydalserine, IgM     <25 U/mL <25  Phosphatydalserine, IgA     <20 U/mL <20  Beta-2  Glycoprotein I Ab, IgG     <=20 SGU 150 (H)  Beta-2-Glycoprotein I IgM     <=20 SMU 10  Beta-2-Glycoprotein I IgA     <=20 SAU 11  Anticardiolipin Ab,IgA,Qn     APL <11  Anticardiolipin Ab,IgG,Qn     GPL <14  Anticardiolipin Ab,IgM,Qn     MPL <12  Result      REPORT  Interpretation      REPORT  Reviewer      REPORT  Interpretation        Reviewer  Act.Prt.C Resist.     >=2.1 ratio 4.3  Antithrombin Activity     80 - 120 % activity 118    RADIOGRAPHIC STUDIES: I have personally reviewed the radiological images as listed and agreed with the findings in the report. No results found.  ASSESSMENT & PLAN:   51 year old gentleman with history of dyslipidemia and chronic smoker with  1) Rt common Iliac artery thrombosis. This was initially noted to be related to an arterial dissection. Other risk factors at baseline include strong history of smoking.  Patient's right common iliac artery was stented and he had a repeat thrombosis while on Plavix noted on 05/11/2016. Aspirin was added at this time. Patient notes that he still having right buttock and thigh claudications and some coldness and paresthesias intermittently in his right foot but that these are much improved.  Noted to have elevated beta-2 glycoprotein IgG with titers that were significantly elevated at 150  2) Elevated beta-2 glycoprotein IgG - cannot r/o antiphospholipid antibody syndrome as a result of this. To prove this is clinical significant will need to demonstrate persistence of ab in 12 weeks Given his presentation - there is enough concern that we would need to treat this as APLA syndrome.  3) severe iron deficiency anemia. Unclear source of blood loss. He has had some chronic hemorrhoidal bleeding which could be part of the reason. Recent EGD and colonoscopy did not show any overt signs of bleeding or concerning lesions. He was cleared by gastroenterology for ongoing anticoagulation. Has int and  external hemorrhoids. Anemia has resolved with IV Iron and hgb today is 13.1. However his ferritin levels have dropped from 192 to 11 with Iron saturation of 4% suggesting ongoing GI bleeding. PLAN -No indication for PRBC transfusion. -would recommend additional IV Iron replacement. IV INjectafer weekly x 2 doses - discussed with patient and he is agreeable -Patient will need repeat of his beta 2 glycoprotein IgG and IgM and anticardiolipin antibodies and lupus anticoagulant in mid may 2018 (3 months after previous testing). -If his beta-2 glycoprotein IgG titers is still significantly elevated would need long-term anticoagulation with Coumadin. -He is to follow-up with Dr. Bridgett Larsson for assessment of his new right foot symptoms. Possible concern for re-innervation discomfort. -Would recommend follow-up with gastroenterology  -patient notes that his primary care physician is setting him up for [facility] follow-up . he needs management of his rectal bleeding likely from internal hemorrhoids . This is especially important since the patient continues to be on Coumadin .  IV injectafer weekly x 2 doses RTC with Dr Irene Limbo in 6 weeks with labs   All of the patients questions were answered with apparent satisfaction. The patient knows to call the clinic with any problems, questions or concerns.  I spent 20 minutes counseling the patient face to face. The total time spent in the appointment was 25 minutes and more than 50% was on counseling and direct patient cares.    Sullivan Lone MD [place] AAHIVMS [facility] Hematology/Oncology Physician [facility]  (Office):       [phone] (Work cell):  [phone] (Fax):           [phone]

## 2016-09-12 ENCOUNTER — Telehealth: Payer: Self-pay | Admitting: Gastroenterology

## 2016-09-12 NOTE — Telephone Encounter (Signed)
Referral from PCP for rectal bleeding.  Patient only saw Dr. Watt Climes in the hospital for a colon. (records in [facility]).  Patient stated Dr. Watt Climes is not in his network with his insurance.  Will you accept, you are DOD?  Thanks!

## 2016-09-12 NOTE — Telephone Encounter (Signed)
sure

## 2016-09-13 ENCOUNTER — Telehealth: Payer: Self-pay | Admitting: Hematology

## 2016-09-13 NOTE — Telephone Encounter (Signed)
Scheduled appt per 4/23 los - patient is aware of appt date and time.  

## 2016-09-15 ENCOUNTER — Other Ambulatory Visit: Payer: 59

## 2016-09-15 ENCOUNTER — Ambulatory Visit (INDEPENDENT_AMBULATORY_CARE_PROVIDER_SITE_OTHER): Payer: 59 | Admitting: Family Medicine

## 2016-09-15 ENCOUNTER — Encounter: Payer: Self-pay | Admitting: Family Medicine

## 2016-09-15 ENCOUNTER — Ambulatory Visit (INDEPENDENT_AMBULATORY_CARE_PROVIDER_SITE_OTHER): Payer: 59 | Admitting: *Deleted

## 2016-09-15 ENCOUNTER — Ambulatory Visit: Payer: 59

## 2016-09-15 VITALS — BP 116/68 | HR 82 | Temp 97.7°F | Wt 191.4 lb

## 2016-09-15 DIAGNOSIS — I739 Peripheral vascular disease, unspecified: Secondary | ICD-10-CM | POA: Diagnosis not present

## 2016-09-15 DIAGNOSIS — Z7901 Long term (current) use of anticoagulants: Secondary | ICD-10-CM | POA: Diagnosis not present

## 2016-09-15 DIAGNOSIS — F17200 Nicotine dependence, unspecified, uncomplicated: Secondary | ICD-10-CM

## 2016-09-15 DIAGNOSIS — I4891 Unspecified atrial fibrillation: Secondary | ICD-10-CM | POA: Insufficient documentation

## 2016-09-15 DIAGNOSIS — G8929 Other chronic pain: Secondary | ICD-10-CM

## 2016-09-15 DIAGNOSIS — F418 Other specified anxiety disorders: Secondary | ICD-10-CM

## 2016-09-15 DIAGNOSIS — M5441 Lumbago with sciatica, right side: Secondary | ICD-10-CM | POA: Diagnosis not present

## 2016-09-15 DIAGNOSIS — M79671 Pain in right foot: Secondary | ICD-10-CM | POA: Diagnosis not present

## 2016-09-15 DIAGNOSIS — I824Y9 Acute embolism and thrombosis of unspecified deep veins of unspecified proximal lower extremity: Secondary | ICD-10-CM | POA: Insufficient documentation

## 2016-09-15 DIAGNOSIS — R079 Chest pain, unspecified: Secondary | ICD-10-CM | POA: Diagnosis not present

## 2016-09-15 DIAGNOSIS — D6859 Other primary thrombophilia: Secondary | ICD-10-CM

## 2016-09-15 DIAGNOSIS — D500 Iron deficiency anemia secondary to blood loss (chronic): Secondary | ICD-10-CM

## 2016-09-15 DIAGNOSIS — Z5181 Encounter for therapeutic drug level monitoring: Secondary | ICD-10-CM | POA: Diagnosis not present

## 2016-09-15 LAB — POCT INR: INR: 4.5

## 2016-09-15 MED ORDER — OXYCODONE-ACETAMINOPHEN 5-325 MG PO TABS: 1.0000 | ORAL_TABLET | Freq: Three times a day (TID) | ORAL | 0 refills | Status: DC | PRN

## 2016-09-15 MED ORDER — GABAPENTIN 300 MG PO CAPS: 600.0000 mg | ORAL_CAPSULE | Freq: Two times a day (BID) | ORAL | 11 refills | Status: DC

## 2016-09-15 MED ORDER — GABAPENTIN 100 MG PO CAPS: 300.0000 mg | ORAL_CAPSULE | Freq: Two times a day (BID) | ORAL | 3 refills | Status: DC

## 2016-09-15 MED FILL — GABAPENTIN 100 MG CAPSULE: 100 | 30 days supply | Qty: 180 | Fill #0

## 2016-09-15 MED FILL — OXYCODONE-ACETAMINOPHEN 5-3: 5-325 | 10 days supply | Qty: 30 | Fill #0

## 2016-09-15 NOTE — Progress Notes (Signed)
[name] is a 51 y.o. male is here to discuss:  History of Present Illness:   [name] CMA acting as scribe for Dr. Juleen China.  Chief Complaint  Patient presents with  . Follow-up  . Shortness of Breath   Shortness of Breath  This is a recurrent problem. The current episode started more than 1 month ago. The problem occurs daily. The problem has been gradually worsening. Associated symptoms include neck pain. Pertinent negatives include no abdominal pain, ear pain, fever, headaches, leg swelling, rash, sore throat or vomiting. Nothing aggravates the symptoms. The patient has no known risk factors for DVT/PE. He has tried nothing for the symptoms.  Back Pain  This is a chronic problem. The problem occurs daily. The problem has been gradually worsening since onset. The pain is present in the lumbar spine. The quality of the pain is described as aching. The pain radiates to the right foot. The pain is moderate. Associated symptoms include numbness and tingling. Pertinent negatives include no abdominal pain, bladder incontinence, bowel incontinence, dysuria, fever, headaches, pelvic pain, perianal numbness or weakness.  Foot Pain This is a recurrent problem since surgery. Location is the right second to and mid forefoot. He notes pain, associated with numbness and tingling. It is intermittent. There is nothing that seems to bring on the pain, make it better, or make it worse. No radiation of pain. No skin changes. No injury. No temperature changes. He is smoking again. He was subtherapeutic for a few days last week.  Health Maintenance Due  Topic Date Due  . HIV Screening  07/08/1982  . TETANUS/TDAP  07/08/1986   PMHx, SurgHx, SocialHx, FamHx, Medications, and Allergies were reviewed in the Visit Navigator and updated as appropriate.   Patient Active Problem List   Diagnosis Date Noted  . Monitoring for anticoagulant use 09/15/2016  . Atrial fibrillation ([place]) [I48.91]  09/15/2016  . Acute venous embolism and thrombosis of deep vessels of proximal lower extremity ([place]) [I82.4Y9] 09/15/2016  . Long term (current) use of anticoagulants [Z79.01] 09/15/2016  . Primary hypercoagulable state ([place]) [D68.59] 09/15/2016  . 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 History of alcohol abuse 07/11/2016  . Chronic left shoulder pain 07/08/2016  . Lung nodules 07/08/2016  . Iron deficiency anemia due to chronic blood loss   . Gastroesophageal reflux disease   . Dyslipidemia 06/27/2016  . Antiphospholipid antibody positive 06/27/2016  . Shortness of breath 06/02/2016  . Chest pain 06/02/2016  . Thrombus 06/02/2016  . PAD (peripheral artery disease) ([place]) 06/02/2016  . Claudication ([place]) 06/02/2016  . Effusion of right knee 05/19/2016  . 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 Atherosclerosis of native arteries of extremity with intermittent claudication ([place]) 02/24/2016  . Weakness of right leg 01/11/2016  . Piriformis syndrome of right side 12/29/2015  . Leg length discrepancy 12/29/2015  . Anxiety with depression 06/24/2014  . Smoker 06/24/2014   Social History  Substance Use Topics  . Smoking status: Current Every Day Smoker    Packs/day: 0.10    Types: Cigarettes, E-cigarettes  . Smokeless tobacco: Never Used     Comment: 1/2 pk per day.  Vaporizor has 0% nicotine.  Trying to quit  . Alcohol use No     Comment: hx  etoh   Current Medications and Allergies:   Current Outpatient Prescriptions:  .  esomeprazole (NEXIUM) 20 MG capsule, Take 20 mg by mouth daily. , Disp: , Rfl:  .  gabapentin (NEURONTIN) 100  MG capsule, Take 3 capsules (300 mg total) by mouth 2 (two) times daily., Disp: 180 capsule, Rfl: 3 .  rosuvastatin (CRESTOR) 5 MG tablet, Take 1 tablet (5 mg total) by mouth daily., Disp: 30 tablet, Rfl: 5 .  warfarin (COUMADIN) 5 MG tablet, Take 1 tablet (5 mg total) by mouth daily., Disp: 100 tablet, Rfl: 5 .  CHANTIX CONTINUING  MONTH PAK 1 MG tablet, Take 1 tablet by mouth daily., Disp: , Rfl: 1 .  oxyCODONE-acetaminophen (ROXICET) 5-325 MG tablet, Take 1 tablet by mouth every 8 (eight) hours as needed for severe pain., Disp: 30 tablet, Rfl: 0  Allergies  Allergen Reactions  . Cymbalta [Duloxetine Hcl] Other (See Comments)    Headaches  . No Known Allergies    Review of Systems   Review of Systems  Constitutional: Positive for malaise/fatigue. Negative for chills and fever.  HENT: Negative for congestion, ear pain, sinus pain and sore throat.   Eyes: Negative for blurred vision and double vision.  Respiratory:       Worse in the last week.  Cardiovascular: Negative for leg swelling.       Chest pain couple times a day. Chest pain lasted about 10 or 15 minutes last night.   Gastrointestinal: Negative for abdominal pain, bowel incontinence, constipation, diarrhea and vomiting.  Genitourinary: Negative for bladder incontinence, dysuria and pelvic pain.  Musculoskeletal: Positive for neck pain. Negative for back pain and joint pain.       Chronic.   Skin: Negative for itching and rash.  Neurological: Positive for tingling and numbness. Negative for weakness and headaches.  Psychiatric/Behavioral: Negative for depression, hallucinations and memory loss.    Vitals:   Vitals:   09/15/16 0728  BP: 116/68  Pulse: 82  Temp: 97.7 F (36.5 C)  TempSrc: Oral  SpO2: 95%  Weight: 191 lb 6.4 oz (86.8 kg)     Body mass index is 28.26 kg/m.   Physical Exam:    Physical Exam  Constitutional: He is oriented to person, place, and time. He appears well-developed and well-nourished. No distress.  HENT:  Head: Normocephalic and atraumatic.  Right Ear: External ear normal.  Left Ear: External ear normal.  Nose: Nose normal.  Mouth/Throat: Oropharynx is clear and moist.  Eyes: Conjunctivae and EOM are normal. Pupils are equal, round, and reactive to light.  Neck: Normal range of motion. Neck supple.    Cardiovascular: Normal rate, regular rhythm, normal heart sounds and intact distal pulses.   Pulses:      Dorsalis pedis pulses are 2+ on the right side.       Posterior tibial pulses are 2+ on the right side.  Pulmonary/Chest: Effort normal and breath sounds normal.  Abdominal: Soft. Bowel sounds are normal.  Musculoskeletal: Normal range of motion.       Right foot: There is normal range of motion, no tenderness, no bony tenderness, no swelling, normal capillary refill, no crepitus and no deformity.       Feet:  Feet:  Right Foot:  Skin Integrity: Negative for ulcer, blister, skin breakdown, erythema, warmth, callus or dry skin.  Neurological: He is alert and oriented to person, place, and time.  Skin: Skin is warm and dry.  Psychiatric: He has a normal mood and affect. His behavior is normal. Judgment and thought content normal.  Nursing note and vitals reviewed.  LUMBAR SPINE (12/29/2015):  FINDINGS: No fracture.  No spondylolisthesis.  Minor loss of disc height L3-L4 with mild loss disc  height at L4-5. Minor endplate osteophytes most evident at L3-L4 and L4-L5. No other degenerative change. Soft tissues are unremarkable.  IMPRESSION: 1. No fracture or acute finding. 2. Mild degenerative change.  EKG (09/15/2016): normal EKG, normal sinus rhythm.  Assessment and Plan:   Davison was seen today for follow-up and shortness of breath.  Diagnoses and all orders for this visit:  Chest pain, unspecified type Comments: EKG reassuring. INR supratherapeutic. Patient admits that he feels it may be more likely anxiety-induced. He has started smoking again. He has undergone a thorough cardiac work-up. No red flags for DVT/PE.  Orders: -     EKG 12-Lead  Right foot pain Comments: DDx neurogenic versus vascular. His pain is in the forefoot - L4-5 distribution. An MRI had was ordered and approved last year prior to the Dx of thrombosis of the right iliac artery. Okay MRI reorder based  on pain in both back and foot. I will send a message to Vascular to see if I should also consider further testing (nerve conduction study, ABI) or if this is an expected finding after his surgeries with continued smoking).        Orders: -     gabapentin (NEURONTIN) 300 MG capsule; Take 2 capsules (600 mg total) by mouth 2 (two) times daily. -     oxyCODONE-acetaminophen (ROXICET) 5-325 MG tablet; Take 1 tablet by mouth every 8 (eight) hours as needed for severe pain.  PAD (peripheral artery disease) ([facility]) Comments: On chronic Coumadin with INR goal 2.5-3.0. We have had a difficult time keeping him in that range. Supratherapuetic today. See subsequent RN note.  Chronic right-sided low back pain with right-sided sciatica Comments: Hx of chronic LBP, with recent flares. See previous notes and above. He is also complaining of more right foot pain and numbness. MRI lumbar.  Smoker Comments: Patient started smoking again. He restarted Chantix.  Iron deficiency anemia due to chronic blood loss Comments: Ferritin low again. Ongoing blood loss. Likely GI. [facility] has accepted him. Will try to expedite visit.   Anxiety with depression Comments: Patient would like to avoid benzos due to fear of addiction. He declines CBT. He failed Cymbalta due to headaches. We reviewed other medication options.    . Reviewed expectations re: course of current medical issues. . Discussed self-management of symptoms. . Outlined signs and symptoms indicating need for more acute intervention. . Patient verbalized understanding and all questions were answered. . See orders for this visit as documented in the electronic medical record. . Patient received an After Visit Summary.   CMA served as Education administrator during this visit. History, Physical, and Plan performed by medical provider. Documentation and orders reviewed and attested to. Briscoe Deutscher, D.O.  Briscoe Deutscher, [place], [place] 09/15/2016  Follow-up: No Follow-up on file.

## 2016-09-15 NOTE — Telephone Encounter (Signed)
I saw an opening on Monday, 4/30 and have scheduled him. Can you please contact patient with this appointment information. Thanks.

## 2016-09-15 NOTE — Patient Instructions (Signed)
Pre visit review using our clinic review tool, if applicable. No additional management support is needed unless otherwise documented below in the visit note. 

## 2016-09-15 NOTE — Telephone Encounter (Signed)
Called to get an appointment as soon as possible to be seen for Rectal Bleeding. He is on Coumadin. Dr Loletha Carrow next available is 10-23-16. Primary Care office would like [place] to call patient back directly with an appointment please.

## 2016-09-18 ENCOUNTER — Encounter: Payer: Self-pay | Admitting: Gastroenterology

## 2016-09-18 ENCOUNTER — Other Ambulatory Visit (INDEPENDENT_AMBULATORY_CARE_PROVIDER_SITE_OTHER): Payer: 59

## 2016-09-18 ENCOUNTER — Ambulatory Visit (INDEPENDENT_AMBULATORY_CARE_PROVIDER_SITE_OTHER): Payer: 59 | Admitting: Gastroenterology

## 2016-09-18 VITALS — BP 110/66 | HR 76 | Ht 69.0 in | Wt 190.0 lb

## 2016-09-18 DIAGNOSIS — K648 Other hemorrhoids: Secondary | ICD-10-CM

## 2016-09-18 DIAGNOSIS — Z7901 Long term (current) use of anticoagulants: Secondary | ICD-10-CM | POA: Diagnosis not present

## 2016-09-18 DIAGNOSIS — D500 Iron deficiency anemia secondary to blood loss (chronic): Secondary | ICD-10-CM

## 2016-09-18 LAB — IGA: IgA: 234 mg/dL (ref 68–378)

## 2016-09-18 NOTE — Patient Instructions (Signed)
If you are age 49 or older, your body mass index should be between 23-30. Your Body mass index is 28.06 kg/m. If this is out of the aforementioned range listed, please consider follow up with your Primary Care Provider.  If you are age 26 or younger, your body mass index should be between 19-25. Your Body mass index is 28.06 kg/m. If this is out of the aformentioned range listed, please consider follow up with your Primary Care Provider.  We have sent your records to Dr Leighton Ruff. They will contact you directly to schedule an appointment. [facility] phone [phone].  Thank you for choosing [facility]  Dr Wilfrid Lund III

## 2016-09-18 NOTE — Progress Notes (Signed)
[facility] Consult Note:  History: [name] 09/18/2016  Referring physician: Briscoe Deutscher, DO  Reason for consult/chief complaint: anemic (Pt states Hemoglobin as low at 6.Has had an iron infusion.  Hemaglobin dropping again. ); Hemorrhoids (in the past); and Rectal Bleeding (bright red blood in the toilet)   Subjective  HPI:  This is a 51 year old man referred by primary care after hospital visit in February 2018. He apparently developed worsening iron deficiency anemia with hemoglobin of 6.5 requiring 2 units transfusion and a hospital stay. He was seen by the [facility] group in consultation because he had intermittent rectal bleeding. The patient had been on aspirin and Plavix for severe peripheral arterial disease, but then had to be put on Coumadin instead when he was discovered to have antiphospholipid antibody syndrome. EGD and colonoscopy by Dr. Watt Climes when the patient was hospitalized 2 months ago revealed internal hemorrhoids. It was not clear if they thought this was the source of the anemia, no biopsies were taken from the small bowel to rule out sprue. Donaldson reports that he has rectal bleeding perhaps once a week, but it might last a couple of days and be "a lot of blood". He has regular BMs, one or 2 times per day, without hard stool or the need to strain. He denies nausea, vomiting, dysphagia, early satiety or weight loss. He has some intermittent sharp left upper quadrant pain that will feel like a gas bubble and then it will pass after brief period of time.  ROS:  Review of Systems  Constitutional: Negative for appetite change and unexpected weight change.  HENT: Negative for mouth sores and voice change.   Eyes: Negative for pain and redness.  Respiratory: Negative for cough and shortness of breath.   Cardiovascular: Negative for chest pain and palpitations.  Genitourinary: Negative for dysuria and hematuria.  Musculoskeletal: Negative for  arthralgias and myalgias.  Skin: Negative for pallor and rash.  Neurological: Negative for weakness and headaches.  Hematological: Negative for adenopathy.     Past Medical History: Past Medical History:  Diagnosis Date  . Anemia   . Anxiety   . Blood dyscrasia    clotting  disorder  . Dyspnea    w/ exertion    hx pleuralsy yrs ago  . GERD (gastroesophageal reflux disease)   . Hyperlipidemia   . PAD (peripheral artery disease) ([place])   . Smoker      Past Surgical History: Past Surgical History:  Procedure Laterality Date  . ANGIOPLASTY ILLIAC ARTERY Right 07/17/2016   Procedure: ANGIOPLASTY RIGHT COMMON ILIAC ARTERY;  Surgeon: Conrad Sadler, MD;  Location: [facility];  Service: Vascular;  Laterality: Right;  . COLONOSCOPY N/A 07/08/2016   Procedure: COLONOSCOPY;  Surgeon: Clarene Essex, MD;  Location: [facility];  Service: Endoscopy;  Laterality: N/A;  . ESOPHAGOGASTRODUODENOSCOPY N/A 07/08/2016   Procedure: ESOPHAGOGASTRODUODENOSCOPY (EGD);  Surgeon: Clarene Essex, MD;  Location: [facility];  Service: Endoscopy;  Laterality: N/A;  . GROIN DEBRIDEMENT Right 07/25/2016   Procedure: EVACUATION HEMATOMA RIGHT GROIN;  Surgeon: Waynetta Sandy, MD;  Location: [facility];  Service: Vascular;  Laterality: Right;  . INTRAOPERATIVE ARTERIOGRAM Right 07/17/2016   Procedure: INTRA OPERATIVE ANGIOGRAM OF RIGHT COMMON ILIAC ARTERY;  Surgeon: Conrad Garvin, MD;  Location: [facility];  Service: Vascular;  Laterality: Right;  . KNEE SURGERY    . neck surger    . NECK SURGERY    . PATCH ANGIOPLASTY Right 07/17/2016   Procedure: PATCH ANGIOPLASTY  RIGHT FEMORAL ARTERY USING Rueben Bash BIOLOGIC PATCH;  Surgeon: Conrad Cowgill, MD;  Location: [facility];  Service: Vascular;  Laterality: Right;  . PERIPHERAL VASCULAR CATHETERIZATION N/A 02/24/2016   Procedure: Abdominal Aortogram w/Lower Extremity;  Surgeon: Conrad [place], MD;  Location: [facility];  Service: Cardiovascular;  Laterality: N/A;  . PERIPHERAL VASCULAR  CATHETERIZATION Right 02/24/2016   Procedure: Peripheral Vascular Intervention;  Surgeon: Conrad [place], MD;  Location: [facility];  Service: Cardiovascular;  Laterality: Right;  Common iliac  . PERIPHERAL VASCULAR CATHETERIZATION N/A 05/11/2016   Procedure: Abdominal Aortogram;  Surgeon: Conrad Hessmer, MD;  Location: [facility];  Service: Cardiovascular;  Laterality: N/A;  . PERIPHERAL VASCULAR CATHETERIZATION N/A 05/11/2016   Procedure: Lower Extremity Angiography;  Surgeon: Conrad Rochelle, MD;  Location: [facility];  Service: Cardiovascular;  Laterality: N/A;  . THROMBECTOMY FEMORAL ARTERY Right 07/17/2016   Procedure: THROMBECTOMY RIGHT FEMORAL ARTERY;  Surgeon: Conrad [place], MD;  Location: [facility];  Service: Vascular;  Laterality: Right;     Family History: Family History  Problem Relation Age of Onset  . Cancer Father     Lung    Social History: Social History   Social History  . Marital status: Married    Spouse name: N/A  . Number of children: N/A  . Years of education: N/A   Social History Main Topics  . Smoking status: Current Every Day Smoker    Packs/day: 0.10    Types: Cigarettes, E-cigarettes  . Smokeless tobacco: Never Used     Comment: 1/2 pk per day.  Vaporizor has 0% nicotine.  Trying to quit  . Alcohol use No     Comment: hx  etoh  . Drug use: No  . Sexual activity: Yes   Other Topics Concern  . None   Social History Narrative  . None    Allergies: Allergies  Allergen Reactions  . Cymbalta [Duloxetine Hcl] Other (See Comments)    Headaches  . No Known Allergies     Outpatient Meds: Current Outpatient Prescriptions  Medication Sig Dispense Refill  . CHANTIX CONTINUING MONTH PAK 1 MG tablet Take 1 tablet by mouth daily.  1  . esomeprazole (NEXIUM) 20 MG capsule Take 20 mg by mouth daily.     Marland Kitchen gabapentin (NEURONTIN) 300 MG capsule Take 2 capsules (600 mg total) by mouth 2 (two) times daily. 120 capsule 11  .  oxyCODONE-acetaminophen (ROXICET) 5-325 MG tablet Take 1 tablet by mouth every 8 (eight) hours as needed for severe pain. 30 tablet 0  . rosuvastatin (CRESTOR) 5 MG tablet Take 1 tablet (5 mg total) by mouth daily. 30 tablet 5  . warfarin (COUMADIN) 5 MG tablet Take 1 tablet (5 mg total) by mouth daily. 100 tablet 5   No current facility-administered medications for this visit.       ___________________________________________________________________ Objective   Exam:  BP 110/66   Pulse 76   Ht 5\' 9"  (1.753 m)   Wt 190 lb (86.2 kg)   BMI 28.06 kg/m    General: this is a(n) Well-appearing man. His wife is with him today.   Eyes: sclera anicteric, no redness  ENT: oral mucosa moist without lesions, no cervical or supraclavicular lymphadenopathy, good dentition  CV: RRR without murmur, S1/S2, no JVD, no peripheral edema  Resp: clear to auscultation bilaterally, normal RR and effort noted  GI: soft, no tenderness, with active bowel sounds. No guarding or palpable  organomegaly noted.  Skin; warm and dry, no rash or jaundice noted  Neuro: awake, alert and oriented x 3. Normal gross motor function and fluent speech Rectal : Normal external, no sphincter tone, no fissure, no palpable internal lesions. Anoscopy : An inflamed large right posterior internal hemorrhoid. Labs:  CBC Latest Ref Rng & Units 09/11/2016 08/25/2016 07/28/2016  WBC 4.0 - 10.3 10e3/uL 13.7(H) 9.8 11.2(H)  Hemoglobin 13.0 - 17.1 g/dL 13.1 12.8(L) 11.2(L)  Hematocrit 38.4 - 49.9 % 40.1 39.8 36.4(L)  Platelets 140 - 400 10e3/uL 269 227 345   Ferritin = 11 on 4/23 INR 4.5 on 4/27   Assessment: Encounter Diagnoses  Name Primary?  . Iron deficiency anemia due to chronic blood loss Yes  . Bleeding internal hemorrhoids   . Chronic anticoagulation     The intermittent bleeding while on anticoagulation appears to been the source for his iron deficiency anemia. Since seen hematology and getting IV iron  treatments, his hemoglobin has normalized. His ferritin is still low at 11, but probably will take more time to normalize. It is possible he has either iron malabsorption or source of occult small bowel blood loss, but I think this is less likely. He would otherwise appear to be a good candidate for hemorrhoidal banding except for his anticoagulation. This increases the risks of procedure, particularly bleeding several days afterwards when the banded tissue falls off and believes a rectal ulcer. For that reason, I think it would be best evaluated by colorectal surgery. They might feel comfortable banding him, but I think it is more safely done in their hands. He cannot stop his Tequesta due to his underlying clotting disorder.  Plan: TTG antibody to rule out celiac sprue Dr.Kale of hematology is following his blood counts. If his hemoglobin drops and/or iron level will not come up with adequate supplementation, then we can revisit the need for small bowel capsule study.  Anusol suppository at bedtime twice a week  Referral to colorectal surgery as noted above  Thank you for the courtesy of this consult.  Please call me with any questions or concerns.  Nelida Meuse III  CC: Briscoe Deutscher, DO

## 2016-09-19 ENCOUNTER — Other Ambulatory Visit: Payer: Self-pay

## 2016-09-19 DIAGNOSIS — Z7901 Long term (current) use of anticoagulants: Secondary | ICD-10-CM

## 2016-09-19 DIAGNOSIS — D509 Iron deficiency anemia, unspecified: Secondary | ICD-10-CM

## 2016-09-19 DIAGNOSIS — K648 Other hemorrhoids: Secondary | ICD-10-CM

## 2016-09-20 ENCOUNTER — Telehealth: Payer: Self-pay

## 2016-09-20 NOTE — Telephone Encounter (Signed)
Referral request for [facility] Dr A.Marcello Moores was faxed on 09-19-2016. Pt has appointment on 10-31-16 @ 830am

## 2016-09-22 ENCOUNTER — Ambulatory Visit (HOSPITAL_BASED_OUTPATIENT_CLINIC_OR_DEPARTMENT_OTHER): Payer: 59

## 2016-09-22 ENCOUNTER — Other Ambulatory Visit: Payer: 59

## 2016-09-22 VITALS — BP 115/70 | HR 67 | Temp 98.1°F | Resp 18

## 2016-09-22 DIAGNOSIS — D500 Iron deficiency anemia secondary to blood loss (chronic): Secondary | ICD-10-CM

## 2016-09-22 DIAGNOSIS — Z7901 Long term (current) use of anticoagulants: Secondary | ICD-10-CM | POA: Diagnosis not present

## 2016-09-22 DIAGNOSIS — D509 Iron deficiency anemia, unspecified: Secondary | ICD-10-CM | POA: Diagnosis not present

## 2016-09-22 DIAGNOSIS — K648 Other hemorrhoids: Secondary | ICD-10-CM

## 2016-09-22 MED ORDER — SODIUM CHLORIDE 0.9 % IV SOLN
750.0000 mg | Freq: Once | INTRAVENOUS | Status: AC
  Administered 2016-09-22: 750 mg via INTRAVENOUS
  Filled 2016-09-22: qty 15

## 2016-09-22 MED ORDER — SODIUM CHLORIDE 0.9 % IV SOLN
Freq: Once | INTRAVENOUS | Status: AC
  Administered 2016-09-22: 09:00:00 via INTRAVENOUS

## 2016-09-22 NOTE — Patient Instructions (Signed)
Ferric carboxymaltose injection What is this medicine? FERRIC CARBOXYMALTOSE (ferr-ik car-box-ee-mol-toes) is an iron complex. Iron is used to make healthy red blood cells, which carry oxygen and nutrients throughout the body. This medicine is used to treat anemia in people with chronic kidney disease or people who cannot take iron by mouth. This medicine may be used for other purposes; ask your health care provider or pharmacist if you have questions. COMMON BRAND NAME(S): Injectafer What should I tell my health care provider before I take this medicine? They need to know if you have any of these conditions: -anemia not caused by low iron levels -high levels of iron in the blood -liver disease -an unusual or allergic reaction to iron, other medicines, foods, dyes, or preservatives -pregnant or trying to get pregnant -breast-feeding How should I use this medicine? This medicine is for infusion into a vein. It is given by a health care professional in a hospital or clinic setting. Talk to your pediatrician regarding the use of this medicine in children. Special care may be needed. Overdosage: If you think you have taken too much of this medicine contact a poison control center or emergency room at once. NOTE: This medicine is only for you. Do not share this medicine with others. What if I miss a dose? It is important not to miss your dose. Call your doctor or health care professional if you are unable to keep an appointment. What may interact with this medicine? Do not take this medicine with any of the following medications: -deferoxamine -dimercaprol -other iron products This medicine may also interact with the following medications: -chloramphenicol -deferasirox This list may not describe all possible interactions. Give your health care provider a list of all the medicines, herbs, non-prescription drugs, or dietary supplements you use. Also tell them if you smoke, drink alcohol, or use  illegal drugs. Some items may interact with your medicine. What should I watch for while using this medicine? Visit your doctor or health care professional regularly. Tell your doctor if your symptoms do not start to get better or if they get worse. You may need blood work done while you are taking this medicine. You may need to follow a special diet. Talk to your doctor. Foods that contain iron include: whole grains/cereals, dried fruits, beans, or peas, leafy green vegetables, and organ meats (liver, kidney). What side effects may I notice from receiving this medicine? Side effects that you should report to your doctor or health care professional as soon as possible: -allergic reactions like skin rash, itching or hives, swelling of the face, lips, or tongue -breathing problems -changes in blood pressure -feeling faint or lightheaded, falls -flushing, sweating, or hot feelings Side effects that usually do not require medical attention (report to your doctor or health care professional if they continue or are bothersome): -changes in taste -constipation -dizziness -headache -nausea -pain, redness, or irritation at site where injected -vomiting This list may not describe all possible side effects. Call your doctor for medical advice about side effects. You may report side effects to FDA at 1-800-FDA-1088. Where should I keep my medicine? This drug is given in a hospital or clinic and will not be stored at home. NOTE: This sheet is a summary. It may not cover all possible information. If you have questions about this medicine, talk to your doctor, pharmacist, or health care provider.  2018 Elsevier/Gold Standard (2015-06-10 11:20:47)  

## 2016-09-25 LAB — TISSUE TRANSGLUTAMINASE, IGA: Tissue Transglutaminase Ab, IgA: 1 U/mL (ref <4)

## 2016-09-26 ENCOUNTER — Telehealth: Payer: Self-pay | Admitting: Gastroenterology

## 2016-09-26 NOTE — Telephone Encounter (Signed)
Patient states he is returning Julie's call about test results from 5.4.18

## 2016-09-27 ENCOUNTER — Other Ambulatory Visit: Payer: 59

## 2016-09-29 ENCOUNTER — Ambulatory Visit: Payer: 59

## 2016-09-29 ENCOUNTER — Encounter: Payer: Self-pay | Admitting: Family Medicine

## 2016-09-29 ENCOUNTER — Ambulatory Visit (INDEPENDENT_AMBULATORY_CARE_PROVIDER_SITE_OTHER): Payer: 59 | Admitting: Family Medicine

## 2016-09-29 ENCOUNTER — Ambulatory Visit (HOSPITAL_BASED_OUTPATIENT_CLINIC_OR_DEPARTMENT_OTHER): Payer: 59

## 2016-09-29 ENCOUNTER — Ambulatory Visit (INDEPENDENT_AMBULATORY_CARE_PROVIDER_SITE_OTHER): Payer: Self-pay | Admitting: *Deleted

## 2016-09-29 VITALS — BP 121/92 | HR 84 | Temp 98.7°F | Resp 17

## 2016-09-29 VITALS — BP 180/100 | HR 72 | Ht 69.0 in | Wt 194.0 lb

## 2016-09-29 DIAGNOSIS — D500 Iron deficiency anemia secondary to blood loss (chronic): Secondary | ICD-10-CM

## 2016-09-29 DIAGNOSIS — F418 Other specified anxiety disorders: Secondary | ICD-10-CM

## 2016-09-29 DIAGNOSIS — M2351 Chronic instability of knee, right knee: Secondary | ICD-10-CM

## 2016-09-29 DIAGNOSIS — D6859 Other primary thrombophilia: Secondary | ICD-10-CM

## 2016-09-29 DIAGNOSIS — M1120 Other chondrocalcinosis, unspecified site: Secondary | ICD-10-CM | POA: Diagnosis not present

## 2016-09-29 DIAGNOSIS — G8929 Other chronic pain: Secondary | ICD-10-CM | POA: Diagnosis not present

## 2016-09-29 DIAGNOSIS — M19012 Primary osteoarthritis, left shoulder: Secondary | ICD-10-CM | POA: Insufficient documentation

## 2016-09-29 DIAGNOSIS — M25569 Pain in unspecified knee: Secondary | ICD-10-CM

## 2016-09-29 DIAGNOSIS — M7552 Bursitis of left shoulder: Secondary | ICD-10-CM | POA: Diagnosis not present

## 2016-09-29 DIAGNOSIS — Z5181 Encounter for therapeutic drug level monitoring: Secondary | ICD-10-CM

## 2016-09-29 DIAGNOSIS — Z7901 Long term (current) use of anticoagulants: Secondary | ICD-10-CM

## 2016-09-29 LAB — POCT INR: INR: 3.5

## 2016-09-29 MED ORDER — VITAMIN D (ERGOCALCIFEROL) 1.25 MG (50000 UNIT) PO CAPS: 50000.0000 [IU] | ORAL_CAPSULE | ORAL | 0 refills | Status: DC

## 2016-09-29 MED ORDER — VENLAFAXINE HCL ER 37.5 MG PO CP24: 37.5000 mg | ORAL_CAPSULE | Freq: Every day | ORAL | 1 refills | Status: DC

## 2016-09-29 MED ORDER — SODIUM CHLORIDE 0.9 % IV SOLN
750.0000 mg | Freq: Once | INTRAVENOUS | Status: AC
  Administered 2016-09-29: 750 mg via INTRAVENOUS
  Filled 2016-09-29: qty 15

## 2016-09-29 MED FILL — VIT D2 1.25 MG (50,000 UNIT: 1.25 MG | 84 days supply | Qty: 12 | Fill #0

## 2016-09-29 MED FILL — VENLAFAXINE HCL ER 37.5 MG: 37.5 | 30 days supply | Qty: 30 | Fill #0

## 2016-09-29 MED FILL — GABAPENTIN 300 MG CAPSULE: 300 | 30 days supply | Qty: 120 | Fill #0

## 2016-09-29 NOTE — Progress Notes (Signed)
I have reviewed this visit and I agree on the patient's plan of dosage and recommendations. Anastacio Bua, DO   

## 2016-09-29 NOTE — Progress Notes (Signed)
[name] [address] [place], [place] [address] Phone: [phone] Subjective:    I'm seeing this patient by the request  of:  Briscoe Deutscher, DO  CC: Knee pain right and left shoulder pai n XNA:[id number]  [name] is a 51 y.o. male coming in with complaint of right knee pain. Patient was seen 5 months ago when he was having right knee pain. Had some mild chondrocalcinosis as well as mild narrowing of the medial joint space. Did have an effusion noted. Patient unfortunately did have significant other health issues and had to put any other treatment on this knee for later date. Patient states Continuing have pain. Still has some feelings of instability. Certain times feels like his knee locks on him. Denies any recent swelling. Patient wants to become active but has not been able to yet.  Also left shoulder pain. Seems to have some mild, throbbing aching sensation. Patient states that he injured in a long time ago and had difficulty moving it. States that the moving came back. Unfortunate he still having discomfort. Rates the severity pain as 5 out of 10. Not as bad as his knee. States certain movements seem to be tight and sometimes can be very uncomfortable and: Asleep at night. No radiation down the arm but does have some tingling from his neck.  Mild neck pain. History of surgery previously.     Past Medical History:  Diagnosis Date  . Anemia   . Anxiety   . Blood dyscrasia    clotting  disorder  . Dyspnea    w/ exertion    hx pleuralsy yrs ago  . GERD (gastroesophageal reflux disease)   . Hyperlipidemia   . PAD (peripheral artery disease) ([place])   . Smoker    Past Surgical History:  Procedure Laterality Date  . ANGIOPLASTY ILLIAC ARTERY Right 07/17/2016   Procedure: ANGIOPLASTY RIGHT COMMON ILIAC ARTERY;  Surgeon: Conrad Bellefonte, MD;  Location: [facility];  Service: Vascular;  Laterality: Right;  . COLONOSCOPY N/A 07/08/2016   Procedure:  COLONOSCOPY;  Surgeon: Clarene Essex, MD;  Location: [facility];  Service: Endoscopy;  Laterality: N/A;  . ESOPHAGOGASTRODUODENOSCOPY N/A 07/08/2016   Procedure: ESOPHAGOGASTRODUODENOSCOPY (EGD);  Surgeon: Clarene Essex, MD;  Location: [facility];  Service: Endoscopy;  Laterality: N/A;  . GROIN DEBRIDEMENT Right 07/25/2016   Procedure: EVACUATION HEMATOMA RIGHT GROIN;  Surgeon: Waynetta Sandy, MD;  Location: [facility];  Service: Vascular;  Laterality: Right;  . INTRAOPERATIVE ARTERIOGRAM Right 07/17/2016   Procedure: INTRA OPERATIVE ANGIOGRAM OF RIGHT COMMON ILIAC ARTERY;  Surgeon: Conrad Bunn, MD;  Location: [facility];  Service: Vascular;  Laterality: Right;  . KNEE SURGERY    . neck surger    . NECK SURGERY    . PATCH ANGIOPLASTY Right 07/17/2016   Procedure: PATCH ANGIOPLASTY RIGHT FEMORAL ARTERY USING Rueben Bash BIOLOGIC PATCH;  Surgeon: Conrad Heath, MD;  Location: [facility];  Service: Vascular;  Laterality: Right;  . PERIPHERAL VASCULAR CATHETERIZATION N/A 02/24/2016   Procedure: Abdominal Aortogram w/Lower Extremity;  Surgeon: Conrad Emmett, MD;  Location: [facility];  Service: Cardiovascular;  Laterality: N/A;  . PERIPHERAL VASCULAR CATHETERIZATION Right 02/24/2016   Procedure: Peripheral Vascular Intervention;  Surgeon: Conrad Bray, MD;  Location: [facility];  Service: Cardiovascular;  Laterality: Right;  Common iliac  . PERIPHERAL VASCULAR CATHETERIZATION N/A 05/11/2016   Procedure: Abdominal Aortogram;  Surgeon: Conrad , MD;  Location: [facility];  Service: Cardiovascular;  Laterality: N/A;  . PERIPHERAL VASCULAR CATHETERIZATION N/A 05/11/2016   Procedure: Lower Extremity Angiography;  Surgeon: Conrad [place], MD;  Location: [facility];  Service: Cardiovascular;  Laterality: N/A;  . THROMBECTOMY FEMORAL ARTERY Right 07/17/2016   Procedure: THROMBECTOMY RIGHT FEMORAL ARTERY;  Surgeon: Conrad Hocking, MD;  Location: [facility];  Service: Vascular;  Laterality: Right;    Social History   Social History  . Marital status: Married    Spouse name: N/A  . Number of children: N/A  . Years of education: N/A   Social History Main Topics  . Smoking status: Current Every Day Smoker    Packs/day: 0.10    Types: Cigarettes, E-cigarettes  . Smokeless tobacco: Never Used     Comment: 1/2 pk per day.  Vaporizor has 0% nicotine.  Trying to quit  . Alcohol use No     Comment: hx  etoh  . Drug use: No  . Sexual activity: Yes   Other Topics Concern  . None   Social History Narrative  . None   Allergies  Allergen Reactions  . Cymbalta [Duloxetine Hcl] Other (See Comments)    Headaches  . No Known Allergies    Family History  Problem Relation Age of Onset  . Cancer Father        Lung    Past medical history, social, surgical and family history all reviewed in electronic medical record.  No pertanent information unless stated regarding to the chief complaint.   Review of Systems:Review of systems updated and as accurate as of 09/29/16  No headache, visual changes, nausea, vomiting, diarrhea, constipation, dizziness, abdominal pain, skin rash, fevers, chills, night sweats, weight loss, swollen lymph nodes, , chest pain, shortness of breath, mood changes. Positive muscle aches, body aches and joint swelling   Objective  Blood pressure (!) 180/100, pulse 72, height 5\' 9"  (1.753 m), weight 194 lb (88 kg), SpO2 98 %. Systems examined below as of 09/29/16   General: No apparent distress alert and oriented x3 mood and affect normal, dressed appropriately.  HEENT: Pupils equal, extraocular movements intact  Respiratory: Patient's speak in full sentences and does not appear short of breath  Cardiovascular: No lower extremity edema, non tender, no erythema  Skin: Warm dry intact with no signs of infection or rash on extremities or on axial skeleton.  Abdomen: Soft nontender  Neuro: Cranial nerves II through XII are intact, neurovascularly intact in all  extremities with 2+ DTRs and 2+ pulses.  Lymph: No lymphadenopathy of posterior or anterior cervical chain or axillae bilaterally.  Gait normal with good balance and coordination.  MSK:  Non tender with full range of motion and good stability and symmetric strength and tone of , elbows, wrist, hip, and ankles bilaterally.  Knee: Right Normal to inspection with no erythema or effusion or obvious bony abnormalities. Tender over the medial joint line as well as with mild over the patellofemoral. Next last 2 of extension Ligaments with solid consistent endpoints including ACL, PCL, LCL, MCL. Positive Mcmurray's, Apley's, and Thessalonian tests. Non painful patellar compression. Patellar glide mild crepitus. Patellar and quadriceps tendons unremarkable. Hamstring and quadriceps strength is normal.   Shoulder: Left Inspection reveals no abnormalities, atrophy or asymmetry. Palpation is normal with no tenderness over AC joint or bicipital groove. ROM is full in all planes. Rotator cuff strength normal throughout. Positive impingement Speeds and Yergason's tests normal. No labral pathology noted with negative Obrien's, negative clunk  and good stability. Normal scapular function observed. No painful arc and no drop arm sign. Positive crossover test No apprehension sign contralateral shoulder unremarkable  MSK US performed of: Right knee This study was ordered, performed, and interpreted by Charlann Boxer D.O.  Knee: Patient does have what appears to be a loose body, calcific within the superior lateral patellofemoral joint at the moment. When skating the rest of his knee there is a fairly large degenerative tear with mild displacement of the medial meniscus.  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      Note:  This dictation was prepared with Dragon dictation along with smaller phrase technology. Any transcriptional errors that result from this process are unintentional.

## 2016-09-29 NOTE — Assessment & Plan Note (Signed)
Sent to physical therapy. Continue conservative therapy. Discussed icing and home exercises.

## 2016-09-29 NOTE — Patient Instructions (Signed)
Ferric carboxymaltose injection What is this medicine? FERRIC CARBOXYMALTOSE (ferr-ik car-box-ee-mol-toes) is an iron complex. Iron is used to make healthy red blood cells, which carry oxygen and nutrients throughout the body. This medicine is used to treat anemia in people with chronic kidney disease or people who cannot take iron by mouth. This medicine may be used for other purposes; ask your health care provider or pharmacist if you have questions. COMMON BRAND NAME(S): Injectafer What should I tell my health care provider before I take this medicine? They need to know if you have any of these conditions: -anemia not caused by low iron levels -high levels of iron in the blood -liver disease -an unusual or allergic reaction to iron, other medicines, foods, dyes, or preservatives -pregnant or trying to get pregnant -breast-feeding How should I use this medicine? This medicine is for infusion into a vein. It is given by a health care professional in a hospital or clinic setting. Talk to your pediatrician regarding the use of this medicine in children. Special care may be needed. Overdosage: If you think you have taken too much of this medicine contact a poison control center or emergency room at once. NOTE: This medicine is only for you. Do not share this medicine with others. What if I miss a dose? It is important not to miss your dose. Call your doctor or health care professional if you are unable to keep an appointment. What may interact with this medicine? Do not take this medicine with any of the following medications: -deferoxamine -dimercaprol -other iron products This medicine may also interact with the following medications: -chloramphenicol -deferasirox This list may not describe all possible interactions. Give your health care provider a list of all the medicines, herbs, non-prescription drugs, or dietary supplements you use. Also tell them if you smoke, drink alcohol, or use  illegal drugs. Some items may interact with your medicine. What should I watch for while using this medicine? Visit your doctor or health care professional regularly. Tell your doctor if your symptoms do not start to get better or if they get worse. You may need blood work done while you are taking this medicine. You may need to follow a special diet. Talk to your doctor. Foods that contain iron include: whole grains/cereals, dried fruits, beans, or peas, leafy green vegetables, and organ meats (liver, kidney). What side effects may I notice from receiving this medicine? Side effects that you should report to your doctor or health care professional as soon as possible: -allergic reactions like skin rash, itching or hives, swelling of the face, lips, or tongue -breathing problems -changes in blood pressure -feeling faint or lightheaded, falls -flushing, sweating, or hot feelings Side effects that usually do not require medical attention (report to your doctor or health care professional if they continue or are bothersome): -changes in taste -constipation -dizziness -headache -nausea -pain, redness, or irritation at site where injected -vomiting This list may not describe all possible side effects. Call your doctor for medical advice about side effects. You may report side effects to FDA at 1-800-FDA-1088. Where should I keep my medicine? This drug is given in a hospital or clinic and will not be stored at home. NOTE: This sheet is a summary. It may not cover all possible information. If you have questions about this medicine, talk to your doctor, pharmacist, or health care provider.  2018 Elsevier/Gold Standard (2015-06-10 11:20:47)  

## 2016-09-29 NOTE — Assessment & Plan Note (Signed)
Patient does have chondrocalcinosis. Discussed with patient at great length. Start once weekly vitamin D to see if this will be beneficial. We discussed icing regimen and home exercises. We discussed objective is doing which ones to avoid. Discussed potential diet changes.

## 2016-09-29 NOTE — Progress Notes (Signed)
I have reviewed this visit and I agree on the patient's plan of dosage and recommendations. Ernisha Sorn, DO   

## 2016-09-29 NOTE — Assessment & Plan Note (Signed)
Anxiety and depression with all the different health issues recently. Did have history of atrial fibrillation as well. Very low dose of Effexor given today. I think patient will respond appropriately. Discussed potential side effects. \Patient was willing to try the medication. We will keep a low dose. Follow-up 4 weeks

## 2016-09-29 NOTE — Assessment & Plan Note (Signed)
Arthritis noted. Discussed icing regimen. Discussed and possible injection if needed. We discussed ergonomics. Follow-up 4 weeks

## 2016-09-29 NOTE — Patient Instructions (Addendum)
Good to see you  PT will be calling you  Ice is your friend. Ice 20 minutes 2 times daily. Usually after activity and before bed. pennsaid pinkie amount topically 2 times daily as needed.   Keep hands within peripheral vision.  Once weekly vitamin D for 12 weeks  Effexor 37.5 mg daily to help with the numbness in the fingers See me again in 2 months.

## 2016-09-29 NOTE — Assessment & Plan Note (Signed)
Patient does have recurrent instability. I think this is secondary to meniscal tear as well as the loose body. Patient does have calcific changes within the meniscus itself. Discussed with patient at great length about different treatment options. Patient is elected try formal physical therapy. We discussed icing regimen, topical anti-inflammatories. Once weekly vitamin D to help with the chondrocalcinosis. Follow-up again in 4 weeks.

## 2016-09-30 ENCOUNTER — Ambulatory Visit
Admission: RE | Admit: 2016-09-30 | Discharge: 2016-09-30 | Disposition: A | Discharge status: Home / Self Care | Payer: 59 | Source: Ambulatory Visit | Attending: Family Medicine | Admitting: Family Medicine

## 2016-09-30 DIAGNOSIS — M48061 Spinal stenosis, lumbar region without neurogenic claudication: Secondary | ICD-10-CM | POA: Diagnosis not present

## 2016-09-30 DIAGNOSIS — M5416 Radiculopathy, lumbar region: Secondary | ICD-10-CM

## 2016-10-02 ENCOUNTER — Ambulatory Visit: Payer: 59 | Admitting: Internal Medicine

## 2016-10-02 ENCOUNTER — Telehealth: Payer: Self-pay | Admitting: Surgical

## 2016-10-02 NOTE — Telephone Encounter (Signed)
Notified patient of message from Dr. Juleen China in Dr. Darliss Ridgel note. Patient will monitor BP.

## 2016-10-02 NOTE — Telephone Encounter (Signed)
-----   Message from Briscoe Deutscher, DO sent at 10/02/2016  7:49 AM EDT -----   ----- Message ----- From: Lyndal Pulley, DO Sent: 09/29/2016  12:17 PM To: Briscoe Deutscher, DO

## 2016-10-02 NOTE — Progress Notes (Signed)
BP elevated again. Monitor. Come in for adjustment of medication if persistent. Note: I agree with Effexor prescribed last week - just know that it may elevated BP.

## 2016-10-06 ENCOUNTER — Ambulatory Visit (INDEPENDENT_AMBULATORY_CARE_PROVIDER_SITE_OTHER): Payer: 59 | Admitting: *Deleted

## 2016-10-06 ENCOUNTER — Other Ambulatory Visit (INDEPENDENT_AMBULATORY_CARE_PROVIDER_SITE_OTHER): Payer: 59

## 2016-10-06 ENCOUNTER — Other Ambulatory Visit: Payer: 59

## 2016-10-06 ENCOUNTER — Ambulatory Visit: Payer: 59 | Admitting: Hematology

## 2016-10-06 DIAGNOSIS — Z7901 Long term (current) use of anticoagulants: Secondary | ICD-10-CM

## 2016-10-06 DIAGNOSIS — G8929 Other chronic pain: Secondary | ICD-10-CM | POA: Diagnosis not present

## 2016-10-06 DIAGNOSIS — Z5181 Encounter for therapeutic drug level monitoring: Secondary | ICD-10-CM | POA: Diagnosis not present

## 2016-10-06 DIAGNOSIS — M25569 Pain in unspecified knee: Secondary | ICD-10-CM | POA: Diagnosis not present

## 2016-10-06 DIAGNOSIS — D6859 Other primary thrombophilia: Secondary | ICD-10-CM

## 2016-10-06 LAB — POCT INR: INR: 2.2

## 2016-10-06 MED FILL — ROSUVASTATIN CALCIUM 5 MG T: 5 | 30 days supply | Qty: 30 | Fill #3

## 2016-10-06 NOTE — Progress Notes (Signed)
I have reviewed this visit and I agree on the patient's plan of dosage and recommendations.    Xaiden Fleig, PA    

## 2016-10-08 ENCOUNTER — Encounter: Payer: Self-pay | Admitting: Family Medicine

## 2016-10-09 ENCOUNTER — Other Ambulatory Visit: Payer: Self-pay

## 2016-10-09 LAB — VITAMIN D 1,25 DIHYDROXY
VITAMIN D 1, 25 (OH) TOTAL: 16 pg/mL — AB (ref 18–72)
VITAMIN D3 1, 25 (OH): 16 pg/mL
Vitamin D2 1, 25 (OH)2: <8 pg/mL

## 2016-10-09 MED ORDER — WARFARIN SODIUM 5 MG PO TABS: 5.0000 mg | ORAL_TABLET | Freq: Every day | ORAL | 0 refills | Status: DC

## 2016-10-09 MED FILL — WARFARIN SODIUM 5 MG TABLET: 5 | 30 days supply | Qty: 30 | Fill #0

## 2016-10-13 ENCOUNTER — Ambulatory Visit (INDEPENDENT_AMBULATORY_CARE_PROVIDER_SITE_OTHER): Payer: Self-pay | Admitting: *Deleted

## 2016-10-13 DIAGNOSIS — Z7901 Long term (current) use of anticoagulants: Secondary | ICD-10-CM

## 2016-10-13 DIAGNOSIS — D6859 Other primary thrombophilia: Secondary | ICD-10-CM

## 2016-10-13 DIAGNOSIS — Z5181 Encounter for therapeutic drug level monitoring: Secondary | ICD-10-CM

## 2016-10-13 LAB — POCT INR: INR: 2.2

## 2016-10-13 NOTE — Progress Notes (Signed)
I have reviewed this visit and I agree on the patient's plan of dosage and recommendations.    Nitasha Jewel, PA    

## 2016-10-17 ENCOUNTER — Encounter: Payer: Self-pay | Admitting: Family Medicine

## 2016-10-20 ENCOUNTER — Ambulatory Visit: Payer: 59

## 2016-10-23 ENCOUNTER — Encounter: Payer: Self-pay | Admitting: Surgical

## 2016-10-23 ENCOUNTER — Encounter: Payer: Self-pay | Admitting: Family Medicine

## 2016-10-23 ENCOUNTER — Ambulatory Visit (HOSPITAL_BASED_OUTPATIENT_CLINIC_OR_DEPARTMENT_OTHER): Payer: 59 | Admitting: Hematology

## 2016-10-23 ENCOUNTER — Ambulatory Visit (INDEPENDENT_AMBULATORY_CARE_PROVIDER_SITE_OTHER): Payer: 59 | Admitting: *Deleted

## 2016-10-23 ENCOUNTER — Encounter: Payer: Self-pay | Admitting: Hematology

## 2016-10-23 ENCOUNTER — Ambulatory Visit (INDEPENDENT_AMBULATORY_CARE_PROVIDER_SITE_OTHER): Payer: 59 | Admitting: Family Medicine

## 2016-10-23 ENCOUNTER — Other Ambulatory Visit (HOSPITAL_BASED_OUTPATIENT_CLINIC_OR_DEPARTMENT_OTHER): Payer: 59

## 2016-10-23 ENCOUNTER — Telehealth: Payer: Self-pay | Admitting: Hematology

## 2016-10-23 VITALS — BP 136/78 | HR 72 | Temp 98.1°F | Ht 69.0 in | Wt 191.4 lb

## 2016-10-23 VITALS — BP 134/73 | HR 65 | Temp 98.2°F | Resp 18 | Ht 69.0 in | Wt 191.6 lb

## 2016-10-23 DIAGNOSIS — Z7901 Long term (current) use of anticoagulants: Secondary | ICD-10-CM

## 2016-10-23 DIAGNOSIS — M5441 Lumbago with sciatica, right side: Secondary | ICD-10-CM

## 2016-10-23 DIAGNOSIS — G8929 Other chronic pain: Secondary | ICD-10-CM

## 2016-10-23 DIAGNOSIS — D500 Iron deficiency anemia secondary to blood loss (chronic): Secondary | ICD-10-CM

## 2016-10-23 DIAGNOSIS — Z720 Tobacco use: Secondary | ICD-10-CM | POA: Diagnosis not present

## 2016-10-23 DIAGNOSIS — Z5181 Encounter for therapeutic drug level monitoring: Secondary | ICD-10-CM | POA: Diagnosis not present

## 2016-10-23 DIAGNOSIS — R760 Raised antibody titer: Secondary | ICD-10-CM

## 2016-10-23 DIAGNOSIS — D6859 Other primary thrombophilia: Secondary | ICD-10-CM

## 2016-10-23 DIAGNOSIS — I745 Embolism and thrombosis of iliac artery: Secondary | ICD-10-CM | POA: Diagnosis not present

## 2016-10-23 DIAGNOSIS — I4891 Unspecified atrial fibrillation: Secondary | ICD-10-CM

## 2016-10-23 DIAGNOSIS — M79671 Pain in right foot: Secondary | ICD-10-CM

## 2016-10-23 DIAGNOSIS — F418 Other specified anxiety disorders: Secondary | ICD-10-CM | POA: Diagnosis not present

## 2016-10-23 LAB — CBC & DIFF AND RETIC
BASO%: 0.6 % (ref 0.0–2.0)
BASOS ABS: 0.1 10*3/uL (ref 0.0–0.1)
EOS%: 6.1 % (ref 0.0–7.0)
Eosinophils Absolute: 0.6 10*3/uL — ABNORMAL HIGH (ref 0.0–0.5)
HCT: 40.6 % (ref 38.4–49.9)
HEMOGLOBIN: 13.2 g/dL (ref 13.0–17.1)
Immature Retic Fract: 11.8 % — ABNORMAL HIGH (ref 3.00–10.60)
LYMPH%: 18.6 % (ref 14.0–49.0)
MCH: 31.1 pg (ref 27.2–33.4)
MCHC: 32.5 g/dL (ref 32.0–36.0)
MCV: 95.5 fL (ref 79.3–98.0)
MONO#: 0.4 10*3/uL (ref 0.1–0.9)
MONO%: 4.1 % (ref 0.0–14.0)
NEUT%: 70.6 % (ref 39.0–75.0)
NEUTROS ABS: 7.3 10*3/uL — AB (ref 1.5–6.5)
PLATELETS: 190 10*3/uL (ref 140–400)
RBC: 4.25 10*6/uL (ref 4.20–5.82)
RDW: 18.4 % — AB (ref 11.0–14.6)
RETIC CT ABS: 114.33 10*3/uL — AB (ref 34.80–93.90)
Retic %: 2.69 % — ABNORMAL HIGH (ref 0.80–1.80)
WBC: 10.4 10*3/uL — AB (ref 4.0–10.3)
lymph#: 1.9 10*3/uL (ref 0.9–3.3)

## 2016-10-23 LAB — COMPREHENSIVE METABOLIC PANEL
ALBUMIN: 3.8 g/dL (ref 3.5–5.0)
ALT: 37 U/L (ref 0–55)
AST: 24 U/L (ref 5–34)
Alkaline Phosphatase: 105 U/L (ref 40–150)
Anion Gap: 11 mEq/L (ref 3–11)
BUN: 8 mg/dL (ref 7.0–26.0)
CO2: 20 meq/L — AB (ref 22–29)
CREATININE: 1 mg/dL (ref 0.7–1.3)
Calcium: 9.4 mg/dL (ref 8.4–10.4)
Chloride: 109 mEq/L (ref 98–109)
EGFR: >90 mL/min/{1.73_m2} (ref >90)
GLUCOSE: 106 mg/dL (ref 70–140)
Potassium: 4.2 mEq/L (ref 3.5–5.1)
SODIUM: 140 meq/L (ref 136–145)
Total Bilirubin: 0.48 mg/dL (ref 0.20–1.20)
Total Protein: 6.9 g/dL (ref 6.4–8.3)

## 2016-10-23 LAB — IRON AND TIBC
%SAT: 12 % — ABNORMAL LOW (ref 20–55)
Iron: 39 ug/dL — ABNORMAL LOW (ref 42–163)
TIBC: 328 ug/dL (ref 202–409)
UIBC: 288 ug/dL (ref 117–376)

## 2016-10-23 LAB — POCT INR: INR: 2.2

## 2016-10-23 LAB — FERRITIN: Ferritin: 160 ng/ml (ref 22–316)

## 2016-10-23 MED ORDER — WARFARIN SODIUM 2.5 MG PO TABS: 2.5000 mg | ORAL_TABLET | Freq: Every day | ORAL | 2 refills | Status: DC

## 2016-10-23 MED ORDER — OXYCODONE-ACETAMINOPHEN 5-325 MG PO TABS: 1.0000 | ORAL_TABLET | Freq: Three times a day (TID) | ORAL | 0 refills | Status: DC | PRN

## 2016-10-23 MED ORDER — WARFARIN SODIUM 4 MG PO TABS: 4.0000 mg | ORAL_TABLET | Freq: Every day | ORAL | 2 refills | Status: DC

## 2016-10-23 MED FILL — WARFARIN SODIUM 4 MG TABLET: 4 | 30 days supply | Qty: 30 | Fill #0

## 2016-10-23 MED FILL — WARFARIN NA 2.5 MG TAB: 2.5 | 30 days supply | Qty: 30 | Fill #0

## 2016-10-23 NOTE — Progress Notes (Signed)
I have reviewed this visit and I agree on the patient's plan of dosage and recommendations. Bulmaro Feagans, DO   

## 2016-10-23 NOTE — Telephone Encounter (Signed)
Per 6/4 LOS - f/u on as needed basis - no additional appts needed.

## 2016-10-23 NOTE — Progress Notes (Signed)
[name] is a 51 y.o. male is here for follow up.  History of Present Illness:   Water quality scientist, CMA, acting as scribe for Dr. Juleen China.  HPI:  1. Chronic right-sided low back pain with right-sided sciatica. MRI last week. Pain continues in low back with radiation to right leg. He still has focal pain in the right forefoot, 2nd-3rd toes. No new symptoms. Pain is worse with working in the yard. Better with rest, Tylenol, and prn Norco. Needs refill. MRI did show evidence of irritation of right L4 nerve root.    2. Long term (current) use of anticoagulants [Z79.01]. INR 2.2 again today. Goal 2.5-3.0.   3. Antiphospholipid antibody positive. Last test next week for syndrome confirmation.   4. Nicotine abuse. Smoking 1/2 - 1 ppd again. Stopped Chantix completely. Not ready to discuss cessation at this time.   5. Anxiety with depression. Improved somewhat with Effexor. Tolerating without side-effects. No SI/HI. No headaches.     Health Maintenance Due  Topic Date Due  . HIV Screening  07/08/1982  . TETANUS/TDAP  07/08/1986   PMHx, SurgHx, SocialHx, FamHx, Medications, and Allergies were reviewed in the Visit Navigator and updated as appropriate.   Patient Active Problem List   Diagnosis Date Noted  . 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 Acute bursitis of left shoulder 09/29/2016  . Arthritis of left acromioclavicular joint 09/29/2016  . Monitoring for anticoagulant use 09/15/2016  . Atrial fibrillation ([place]) [I48.91] 09/15/2016  . Acute venous embolism and thrombosis of deep vessels of proximal lower extremity ([place]) [I82.4Y9] 09/15/2016  . Long term (current) use of anticoagulants [Z79.01] 09/15/2016  . Primary hypercoagulable state ([place]) [D68.59] 09/15/2016  . 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 History of alcohol abuse 07/11/2016  . Chronic left shoulder pain 07/08/2016  . Lung nodules 07/08/2016  . Iron deficiency anemia due to chronic blood loss   .  Gastroesophageal reflux disease   . Dyslipidemia 06/27/2016  . Antiphospholipid antibody positive 06/27/2016  . Shortness of breath 06/02/2016  . Chest pain 06/02/2016  . Thrombus 06/02/2016  . PAD (peripheral artery disease) ([place]) 06/02/2016  . Claudication ([place]) 06/02/2016  . Effusion of right knee 05/19/2016  . 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 Atherosclerosis of native arteries of extremity with intermittent claudication ([place]) 02/24/2016  . Weakness of right leg 01/11/2016  . Piriformis syndrome of right side 12/29/2015  . Leg length discrepancy 12/29/2015  . Anxiety with depression 06/24/2014  . Nicotine abuse 06/24/2014   Social History  Substance Use Topics  . Smoking status: Current Every Day Smoker    Packs/day: 0.10    Types: Cigarettes, E-cigarettes  . Smokeless tobacco: Never Used     Comment: 1/2 pk per day.  Vaporizor has 0% nicotine.  Trying to quit  . Alcohol use No     Comment: hx  etoh   Current Medications and Allergies:   Current Outpatient Prescriptions:  .  esomeprazole (NEXIUM) 20 MG capsule, Take 20 mg by mouth daily. , Disp: , Rfl:  .  oxyCODONE-acetaminophen (ROXICET) 5-325 MG tablet, Take 1 tablet by mouth every 8 (eight) hours as needed for severe pain., Disp: 30 tablet, Rfl: 0 .  rosuvastatin (CRESTOR) 5 MG tablet, Take 1 tablet (5 mg total) by mouth daily., Disp: 30 tablet, Rfl: 5 .  venlafaxine XR (EFFEXOR XR) 37.5 MG 24 hr capsule, Take 1 capsule (37.5 mg total) by mouth daily with breakfast., Disp: 30 capsule, Rfl: 1 .  Vitamin  D, Ergocalciferol, (DRISDOL) 50000 units CAPS capsule, Take 1 capsule (50,000 Units total) by mouth every 7 (seven) days., Disp: 12 capsule, Rfl: 0 .  warfarin (COUMADIN) 5 MG tablet, Take 1 tablet (5 mg total) by mouth daily., Disp: 100 tablet, Rfl: 5 .  warfarin (COUMADIN) 5 MG tablet, Take 1 tablet (5 mg total) by mouth daily., Disp: 30 tablet, Rfl: 0 .  gabapentin (NEURONTIN) 300 MG capsule, Take 2 capsules  (600 mg total) by mouth 2 (two) times daily., Disp: 120 capsule, Rfl: 11 .  warfarin (COUMADIN) 2.5 MG tablet, Take 1 tablet (2.5 mg total) by mouth daily., Disp: 30 tablet, Rfl: 2 .  warfarin (COUMADIN) 4 MG tablet, Take 1 tablet (4 mg total) by mouth daily., Disp: 30 tablet, Rfl: 2  Allergies  Allergen Reactions  . Cymbalta [Duloxetine Hcl] Other (See Comments)    Headaches  . No Known Allergies    Review of Systems   Review of Systems  Constitutional: Negative for chills.  HENT: Negative for congestion.   Eyes: Negative for blurred vision and pain.  Respiratory: Negative for cough.   Cardiovascular: Negative for palpitations.  Gastrointestinal: Negative for nausea.  Genitourinary: Negative for frequency.  Musculoskeletal:       Foot pain.  Neurological: Negative for dizziness and loss of consciousness.  Endo/Heme/Allergies: Bruises/bleeds easily.       Patient is on Coumadin.  Psychiatric/Behavioral: Negative for depression. The patient is not nervous/anxious.    Vitals:   Vitals:   10/23/16 1302  BP: 136/78  Pulse: 72  Temp: 98.1 F (36.7 C)  TempSrc: Oral  SpO2: 98%  Weight: 191 lb 6.4 oz (86.8 kg)  Height: 5\' 9"  (1.753 m)     Body mass index is 28.26 kg/m.  Physical Exam:   Physical Exam  Constitutional: He is oriented to person, place, and time. He appears well-developed and well-nourished. No distress.  HENT:  Head: Normocephalic and atraumatic.  Right Ear: External ear normal.  Left Ear: External ear normal.  Nose: Nose normal.  Mouth/Throat: Oropharynx is clear and moist.  Eyes: Conjunctivae and EOM are normal. Pupils are equal, round, and reactive to light.  Neck: Normal range of motion. Neck supple.  Cardiovascular: Normal rate, regular rhythm, normal heart sounds and intact distal pulses.   Pulses:      Dorsalis pedis pulses are 2+ on the right side.       Posterior tibial pulses are 2+ on the right side.  Pulmonary/Chest: Effort normal and  breath sounds normal.  Abdominal: Soft. Bowel sounds are normal.  Musculoskeletal: Normal range of motion.       Right foot: There is normal range of motion, no tenderness, no bony tenderness, no swelling, normal capillary refill, no crepitus and no deformity.  Feet:  Right Foot:  Skin Integrity: Negative for ulcer, blister, skin breakdown, erythema, warmth, callus or dry skin.  Neurological: He is alert and oriented to person, place, and time.  Skin: Skin is warm and dry.  Psychiatric: He has a normal mood and affect. His behavior is normal. Judgment and thought content normal.  Nursing note and vitals reviewed.  Results for orders placed or performed in visit on 10/23/16  POCT INR  Result Value Ref Range   INR 2.2    Assessment and Plan:   [name] was seen today for follow-up, back pain, foot pain and shortness of breath.  Diagnoses and all orders for this visit:  Chronic right-sided low back pain with right-sided sciatica  Comments: MRI reviewed with patient. I agree with Dr. Tamala Julian. Continue f/u with him. Okay Norco prn.  Orders: -     oxyCODONE-acetaminophen (ROXICET) 5-325 MG tablet; Take 1 tablet by mouth every 8 (eight) hours as needed for severe pain.  Long term (current) use of anticoagulants [Z79.01] Comments: Changed dosage for ease to 6.5 mg po daily.  Orders: -     warfarin (COUMADIN) 4 MG tablet; Take 1 tablet (4 mg total) by mouth daily. -     warfarin (COUMADIN) 2.5 MG tablet; Take 1 tablet (2.5 mg total) by mouth daily.  Antiphospholipid antibody positive Comments: Awaiting his last + test before receiving the diagnosis.   Nicotine abuse Comments: He is back to smoking 1 ppd. Stopped the Chantix. Not interested in cessation at this time.   Anxiety with depression Comments: Tolerating the Effexor.   Right foot pain Comments: Stable. Believed to be due to lumbar stenosis. Upcoming appointment with Vascular for repeat ABIs.    . Reviewed expectations  re: course of current medical issues. . Discussed self-management of symptoms. . Outlined signs and symptoms indicating need for more acute intervention. . Patient verbalized understanding and all questions were answered. [facility] Kitchen Health Maintenance issues including appropriate healthy diet, exercise, and smoking avoidance were discussed with patient. . See orders for this visit as documented in the electronic medical record. . Patient received an After Visit Summary.  CMA served as Education administrator during this visit. History, Physical, and Plan performed by medical provider. The above documentation has been reviewed and is accurate and complete. Briscoe Deutscher, D.O.  Briscoe Deutscher, DO [place] [place] 10/26/2016  Future Appointments Date Time Provider [place]  11/03/2016 3:30 PM [facility] None  11/14/2016 8:30 AM [facility]  11/14/2016 9:30 AM [facility]  11/17/2016 4:00 PM Conrad Lambert, MD [facility]

## 2016-10-24 MED FILL — OXYCODONE-ACETAMINOPHEN 5-3: 5-325 | 10 days supply | Qty: 30 | Fill #0

## 2016-10-25 LAB — LUPUS ANTICOAGULANT PANEL
DRVVT MIX: 44.4 s (ref 0.0–47.0)
DRVVT: 70 s — AB (ref 0.0–47.0)
PTT-LA: 43.3 s (ref 0.0–51.9)

## 2016-10-25 LAB — CARDIOLIPIN ANTIBODIES, IGG, IGM, IGA: Anticardiolipin Ab,IgG,Qn: <9 GPL U/mL (ref 0–14)

## 2016-10-25 LAB — BETA-2-GLYCOPROTEIN I ABS, IGG/M/A
Beta-2 Glyco 1 IgA: <9 GPI IgA units (ref 0–25)
Beta-2 Glycoprotein I Ab, IgG: 46 GPI IgG units — ABNORMAL HIGH (ref 0–20)

## 2016-10-27 MED FILL — GABAPENTIN 300 MG CAPSULE: 300 | 30 days supply | Qty: 120 | Fill #1

## 2016-10-31 ENCOUNTER — Other Ambulatory Visit: Payer: Self-pay | Admitting: General Surgery

## 2016-10-31 DIAGNOSIS — K648 Other hemorrhoids: Secondary | ICD-10-CM | POA: Diagnosis not present

## 2016-10-31 NOTE — H&P (Signed)
History of Present Illness Leighton Ruff MD; 2/95/1884 9:07 AM) The patient is a 51 year old male who presents with a complaint of Rectal bleeding. 51 year old male who presents to the office for evaluation of rectal bleeding. This has been going on for several months. He was seen in the hospital due to a hemoglobin of 6. He underwent a colonoscopy in February 2018 which showed internal hemorrhoids and no other source of bleeding. He has received IV iron treatments and his hemoglobin is back to normal levels. He has a history of antiphospholipid syndrome and peripheral artery disease and therefore needs Coumadin and antiplatelet therapy. He is tried over-the-counter remedies for his hemorrhoids. He does have a history of hemorrhoid excision in the past.   Past Surgical History Malachy Moan, RMA; 10/31/2016 8:55 AM) Bypass Surgery for Poor Blood Flow to Legs Knee Surgery Right.  Diagnostic Studies History Malachy Moan, [place]; 10/31/2016 8:55 AM) Colonoscopy within last year  Allergies Malachy Moan, RMA; 10/31/2016 8:56 AM) Cymbalta *ANTIDEPRESSANTS*  Medication History Malachy Moan, RMA; 10/31/2016 8:59 AM) NexIUM (20MG  Capsule DR, Oral) Active. Gabapentin (300MG  Capsule, Oral) Active. Oxycodone-Acetaminophen (5-325MG  Tablet, Oral) Active. Rosuvastatin Calcium (5MG  Tablet, Oral) Active. Venlafaxine HCl ER (37.5MG  Capsule ER 24HR, Oral) Active. Vitamin D (Ergocalciferol) (50000UNIT Capsule, Oral) Active. Warfarin Sodium (2.5MG  Tablet, Oral) Active. Warfarin Sodium (4MG  Tablet, Oral) Active. Warfarin Sodium (5MG  Tablet, Oral) Active. Medications Reconciled  Social History Malachy Moan, [place]; 10/31/2016 8:55 AM) Alcohol use Recently quit alcohol use. Caffeine use Carbonated beverages, Coffee, Tea. Illicit drug use Prefer to discuss with provider. Tobacco use Current every day smoker.  Family History Malachy Moan, [place]; 10/31/2016 8:55  AM) Cancer Father. Colon Polyps Mother. Diabetes Mellitus Father. Heart Disease Father. Hypertension Father. Respiratory Condition Father.  Other Problems Malachy Moan, RMA; 10/31/2016 8:55 AM) Alcohol Abuse Anxiety Disorder Back Pain Gastroesophageal Reflux Disease Hemorrhoids Pulmonary Embolism / Blood Clot in Legs Transfusion history Vascular Disease     Review of Systems Malachy Moan RMA; 10/31/2016 8:55 AM) General Not Present- Appetite Loss, Chills, Fatigue, Fever, Night Sweats, Weight Gain and Weight Loss. Skin Not Present- Change in Wart/Mole, Dryness, Hives, Jaundice, New Lesions, Non-Healing Wounds, Rash and Ulcer. HEENT Not Present- Earache, Hearing Loss, Hoarseness, Nose Bleed, Oral Ulcers, Ringing in the Ears, Seasonal Allergies, Sinus Pain, Sore Throat, Visual Disturbances, Wears glasses/contact lenses and Yellow Eyes. Respiratory Not Present- Bloody sputum, Chronic Cough, Difficulty Breathing, Snoring and Wheezing. Cardiovascular Not Present- Chest Pain, Difficulty Breathing Lying Down, Leg Cramps, Palpitations, Rapid Heart Rate, Shortness of Breath and Swelling of Extremities. Gastrointestinal Present- Hemorrhoids and Indigestion. Not Present- Abdominal Pain, Bloating, Bloody Stool, Change in Bowel Habits, Chronic diarrhea, Constipation, Difficulty Swallowing, Excessive gas, Gets full quickly at meals, Nausea, Rectal Pain and Vomiting. Male Genitourinary Not Present- Blood in Urine, Change in Urinary Stream, Frequency, Impotence, Nocturia, Painful Urination, Urgency and Urine Leakage. Musculoskeletal Present- Back Pain. Not Present- Joint Pain, Joint Stiffness, Muscle Pain, Muscle Weakness and Swelling of Extremities. Neurological Not Present- Decreased Memory, Fainting, Headaches, Numbness, Seizures, Tingling, Tremor, Trouble walking and Weakness. Psychiatric Present- Anxiety. Not Present- Bipolar, Change in Sleep Pattern, Depression, Fearful and  Frequent crying. Endocrine Not Present- Cold Intolerance, Excessive Hunger, Hair Changes, Heat Intolerance, Hot flashes and New Diabetes. Hematology Present- Blood Thinners. Not Present- Easy Bruising, Excessive bleeding, Gland problems, HIV and Persistent Infections.  Vitals Malachy Moan RMA; 10/31/2016 8:59 AM) 10/31/2016 8:59 AM Weight: 186.8 lb Height: 69in Body Surface Area: 2.01 m Body Mass Index: 27.59 kg/m  Temp.: 97.41F  Pulse: 67 (Regular)  BP: 122/80 (Sitting, Left Arm, Standard)      Physical Exam Leighton Ruff MD; 3/53/2992 9:27 AM)  General Mental Status-Alert. General Appearance-Not in acute distress. Build & Nutrition-Well nourished. Posture-Normal posture. Gait-Normal.  Head and Neck Head-normocephalic, atraumatic with no lesions or palpable masses. Trachea-midline.  Chest and Lung Exam Chest and lung exam reveals -on auscultation, normal breath sounds, no adventitious sounds and normal vocal resonance.  Cardiovascular Cardiovascular examination reveals -normal heart sounds, regular rate and rhythm with no murmurs and no digital clubbing, cyanosis, edema, increased warmth or tenderness.  Abdomen Inspection Inspection of the abdomen reveals - No Hernias. Palpation/Percussion Palpation and Percussion of the abdomen reveal - Soft, Non Tender, No Rigidity (guarding), No hepatosplenomegaly and No Palpable abdominal masses.  Rectal Anorectal Exam External - normal external exam. Internal - normal internal exam and normal sphincter tone.  Neurologic Neurologic evaluation reveals -alert and oriented x 3 with no impairment of recent or remote memory, normal attention span and ability to concentrate, normal sensation and normal coordination.  Musculoskeletal Normal Exam - Bilateral-Upper Extremity Strength Normal and Lower Extremity Strength Normal.   Results Leighton Ruff MD; 09/15/8339 9:32  AM) Procedures  Name Value Date ANOSCOPY, DIAGNOSTIC (96222) [ Hemorrhoids ] Procedure Other: Procedure: Anoscopy Surgeon: Marcello Moores After the risks and benefits were explained, verbal consent was obtained for above procedure. A medical assistant chaperone was present thoroughout the entire procedure. Anesthesia: none Diagnosis: Rectal bleeding Findings: Grade 2, large right posterior and left lateral internal hemorrhoids. Smaller grade 2 right anterior internal hemorrhoid. All 3 with stigmata of bleeding.  Performed: 10/31/2016 9:27 AM    Assessment & Plan Leighton Ruff MD; 9/79/8921 9:29 AM)  BLEEDING INTERNAL HEMORRHOIDS (K64.8) Impression: 51 year old male who presents to the office for evaluation of bleeding internal hemorrhoids. He is on anticoagulation and is required to stay on this. We discussed treatment options including rubber band ligation excision and transfemoral dearterialization. We discussed that all have significant bleeding risk. I think that bleeding Maudie Mercury be relatively well controlled during the operative procedure. Hopefully bleeding afterwards will be self-limited and similar to what he is experiencing now. After weighing the options, he has decided to undergo hemorrhoid excision. He will follow up with his doctor regarding his hemoglobin level. We will then plan on doing this surgery as soon as possible.

## 2016-11-01 MED FILL — VENLAFAXINE HCL ER 37.5 MG: 37.5 | 30 days supply | Qty: 30 | Fill #1

## 2016-11-01 MED FILL — ROSUVASTATIN CALCIUM 5 MG T: 5 | 30 days supply | Qty: 30 | Fill #4

## 2016-11-03 ENCOUNTER — Encounter: Payer: Self-pay | Admitting: Vascular Surgery

## 2016-11-03 ENCOUNTER — Ambulatory Visit (INDEPENDENT_AMBULATORY_CARE_PROVIDER_SITE_OTHER): Payer: 59 | Admitting: Family Medicine

## 2016-11-03 DIAGNOSIS — Z5181 Encounter for therapeutic drug level monitoring: Secondary | ICD-10-CM | POA: Diagnosis not present

## 2016-11-03 LAB — POCT INR: INR: 2.6

## 2016-11-06 NOTE — Progress Notes (Signed)
I have reviewed this visit and I agree on the patient's plan of dosage and recommendations. Raydin Bielinski B Jezabelle Chisolm, FNP   

## 2016-11-08 ENCOUNTER — Ambulatory Visit: Payer: 59 | Admitting: Family Medicine

## 2016-11-14 ENCOUNTER — Ambulatory Visit (HOSPITAL_COMMUNITY)
Admission: RE | Admit: 2016-11-14 | Discharge: 2016-11-14 | Disposition: A | Discharge status: Home / Self Care | Payer: 59 | Source: Ambulatory Visit | Attending: Vascular Surgery | Admitting: Vascular Surgery

## 2016-11-14 ENCOUNTER — Ambulatory Visit (INDEPENDENT_AMBULATORY_CARE_PROVIDER_SITE_OTHER)
Admission: RE | Admit: 2016-11-14 | Discharge: 2016-11-14 | Disposition: A | Discharge status: Home / Self Care | Payer: 59 | Source: Ambulatory Visit | Attending: Vascular Surgery | Admitting: Vascular Surgery

## 2016-11-14 DIAGNOSIS — I70211 Atherosclerosis of native arteries of extremities with intermittent claudication, right leg: Secondary | ICD-10-CM | POA: Insufficient documentation

## 2016-11-14 DIAGNOSIS — Z720 Tobacco use: Secondary | ICD-10-CM | POA: Insufficient documentation

## 2016-11-14 DIAGNOSIS — R0989 Other specified symptoms and signs involving the circulatory and respiratory systems: Secondary | ICD-10-CM | POA: Diagnosis present

## 2016-11-14 DIAGNOSIS — E785 Hyperlipidemia, unspecified: Secondary | ICD-10-CM | POA: Diagnosis not present

## 2016-11-17 ENCOUNTER — Ambulatory Visit (INDEPENDENT_AMBULATORY_CARE_PROVIDER_SITE_OTHER): Payer: 59 | Admitting: Family Medicine

## 2016-11-17 ENCOUNTER — Ambulatory Visit (INDEPENDENT_AMBULATORY_CARE_PROVIDER_SITE_OTHER): Payer: 59 | Admitting: Vascular Surgery

## 2016-11-17 ENCOUNTER — Encounter: Payer: Self-pay | Admitting: Family Medicine

## 2016-11-17 ENCOUNTER — Ambulatory Visit: Payer: 59

## 2016-11-17 ENCOUNTER — Encounter: Payer: Self-pay | Admitting: Vascular Surgery

## 2016-11-17 VITALS — BP 129/86 | HR 80 | Temp 97.3°F | Resp 20 | Ht 69.0 in | Wt 191.5 lb

## 2016-11-17 VITALS — BP 138/80 | HR 91 | Temp 98.3°F | Ht 69.0 in | Wt 190.4 lb

## 2016-11-17 DIAGNOSIS — I739 Peripheral vascular disease, unspecified: Secondary | ICD-10-CM

## 2016-11-17 DIAGNOSIS — Z7901 Long term (current) use of anticoagulants: Secondary | ICD-10-CM | POA: Diagnosis not present

## 2016-11-17 DIAGNOSIS — K648 Other hemorrhoids: Secondary | ICD-10-CM | POA: Diagnosis not present

## 2016-11-17 DIAGNOSIS — I745 Embolism and thrombosis of iliac artery: Secondary | ICD-10-CM | POA: Diagnosis not present

## 2016-11-17 DIAGNOSIS — Z5181 Encounter for therapeutic drug level monitoring: Secondary | ICD-10-CM | POA: Diagnosis not present

## 2016-11-17 DIAGNOSIS — I824Y9 Acute embolism and thrombosis of unspecified deep veins of unspecified proximal lower extremity: Secondary | ICD-10-CM

## 2016-11-17 DIAGNOSIS — N529 Male erectile dysfunction, unspecified: Secondary | ICD-10-CM

## 2016-11-17 DIAGNOSIS — R760 Raised antibody titer: Secondary | ICD-10-CM | POA: Diagnosis not present

## 2016-11-17 DIAGNOSIS — M5441 Lumbago with sciatica, right side: Secondary | ICD-10-CM | POA: Diagnosis not present

## 2016-11-17 DIAGNOSIS — I4891 Unspecified atrial fibrillation: Secondary | ICD-10-CM

## 2016-11-17 DIAGNOSIS — G8929 Other chronic pain: Secondary | ICD-10-CM | POA: Diagnosis not present

## 2016-11-17 DIAGNOSIS — D500 Iron deficiency anemia secondary to blood loss (chronic): Secondary | ICD-10-CM

## 2016-11-17 DIAGNOSIS — Z720 Tobacco use: Secondary | ICD-10-CM | POA: Diagnosis not present

## 2016-11-17 DIAGNOSIS — D6859 Other primary thrombophilia: Secondary | ICD-10-CM

## 2016-11-17 LAB — IBC PANEL
Iron: 43 ug/dL (ref 42–165)
Saturation Ratios: 9.2 % — ABNORMAL LOW (ref 20.0–50.0)
Transferrin: 333 mg/dL (ref 212.0–360.0)

## 2016-11-17 LAB — CBC WITH DIFFERENTIAL/PLATELET
Basophils Absolute: 0.1 10*3/uL (ref 0.0–0.1)
Basophils Relative: 1 % (ref 0.0–3.0)
Eosinophils Absolute: 0.7 10*3/uL (ref 0.0–0.7)
Eosinophils Relative: 6.1 % — ABNORMAL HIGH (ref 0.0–5.0)
HCT: 40.8 % (ref 39.0–52.0)
Hemoglobin: 13.8 g/dL (ref 13.0–17.0)
Lymphocytes Relative: 20.7 % (ref 12.0–46.0)
Lymphs Abs: 2.4 10*3/uL (ref 0.7–4.0)
MCHC: 33.7 g/dL (ref 30.0–36.0)
MCV: 92.7 fl (ref 78.0–100.0)
Monocytes Absolute: 0.7 10*3/uL (ref 0.1–1.0)
Monocytes Relative: 6 % (ref 3.0–12.0)
Neutro Abs: 7.8 10*3/uL — ABNORMAL HIGH (ref 1.4–7.7)
Neutrophils Relative %: 66.2 % (ref 43.0–77.0)
Platelets: 294 10*3/uL (ref 150.0–400.0)
RBC: 4.4 Mil/uL (ref 4.22–5.81)
RDW: 16.3 % — ABNORMAL HIGH (ref 11.5–15.5)
WBC: 11.7 10*3/uL — ABNORMAL HIGH (ref 4.0–10.5)

## 2016-11-17 LAB — POCT INR: INR: 3.2

## 2016-11-17 LAB — FERRITIN: Ferritin: 19.5 ng/mL — ABNORMAL LOW (ref 22.0–322.0)

## 2016-11-17 MED ORDER — SILDENAFIL CITRATE 25 MG PO TABS: ORAL_TABLET | ORAL | 0 refills | Status: DC

## 2016-11-17 MED ORDER — GABAPENTIN 600 MG PO TABS: 600.0000 mg | ORAL_TABLET | Freq: Three times a day (TID) | ORAL | 3 refills | Status: DC

## 2016-11-17 MED ORDER — OXYCODONE-ACETAMINOPHEN 10-325 MG PO TABS: 1.0000 | ORAL_TABLET | Freq: Three times a day (TID) | ORAL | 0 refills | Status: DC | PRN

## 2016-11-17 MED FILL — OXYCODONE-ACETAMINOPHEN 10-: 10-325 | 20 days supply | Qty: 60 | Fill #0

## 2016-11-17 MED FILL — SILDENAFIL CITRATE 25 MG TA: 25 | 30 days supply | Qty: 6 | Fill #0

## 2016-11-17 MED FILL — GABAPENTIN 600 MG TAB: 600 | 30 days supply | Qty: 90 | Fill #0

## 2016-11-17 MED FILL — WARFARIN SODIUM 4 MG TABLET: 4 | 30 days supply | Qty: 30 | Fill #1

## 2016-11-17 MED FILL — WARFARIN NA 2.5 MG TAB: 2.5 | 30 days supply | Qty: 30 | Fill #1

## 2016-11-17 NOTE — Progress Notes (Signed)
Established Intermittent Claudication   History of Present Illness   [name] is a 51 y.o. (10-08-1965) male who presents with chief complaint: right calf neuropathic sx.  Pt recently had a MRI which demonstrated HNP at a couple of levels..    Prior procedures include: 1. TE R CIA, PTA R CIA (07/17/16) 2. R CIA PTA+S (02/24/16)  The patient's symptoms have not progressed.  The patient's symptoms are: right calf neuropathy sx and some R medial thigh numbness.  The patient's treatment regimen currently included: maximal medical management.  The patient's PMH, PSH, and SH, and FamHx are unchanged from 08/11/16.  Current Outpatient Prescriptions  Medication Sig Dispense Refill  . esomeprazole (NEXIUM) 20 MG capsule Take 20 mg by mouth daily.     Marland Kitchen gabapentin (NEURONTIN) 300 MG capsule Take 2 capsules (600 mg total) by mouth 2 (two) times daily. 120 capsule 11  . oxyCODONE-acetaminophen (ROXICET) 5-325 MG tablet Take 1 tablet by mouth every 8 (eight) hours as needed for severe pain. 30 tablet 0  . rosuvastatin (CRESTOR) 5 MG tablet Take 1 tablet (5 mg total) by mouth daily. 30 tablet 5  . venlafaxine XR (EFFEXOR XR) 37.5 MG 24 hr capsule Take 1 capsule (37.5 mg total) by mouth daily with breakfast. 30 capsule 1  . Vitamin D, Ergocalciferol, (DRISDOL) 50000 units CAPS capsule Take 1 capsule (50,000 Units total) by mouth every 7 (seven) days. 12 capsule 0  . warfarin (COUMADIN) 2.5 MG tablet Take 1 tablet (2.5 mg total) by mouth daily. 30 tablet 2  . warfarin (COUMADIN) 4 MG tablet Take 1 tablet (4 mg total) by mouth daily. 30 tablet 2  . warfarin (COUMADIN) 5 MG tablet Take 1 tablet (5 mg total) by mouth daily. 100 tablet 5  . warfarin (COUMADIN) 5 MG tablet Take 1 tablet (5 mg total) by mouth daily. 30 tablet 0   No current facility-administered medications for this visit.    On ROS today: neuropathic sx R leg, HNP L-spine.   Physical Examination   Vitals:   11/17/16 1606    BP: 129/86  Pulse: 80  Resp: 20  Temp: 97.3 F (36.3 C)  TempSrc: Oral  SpO2: 95%  Weight: 191 lb 8 oz (86.9 kg)  Height: 5\' 9"  (1.753 m)   Body mass index is 28.28 kg/m.  General Alert, O x 3, WD, NAD  Pulmonary Sym exp, good B air movt, CTA B  Cardiac RRR, Nl S1, S2, no Murmurs, No rubs, No S3,S4  Vascular Vessel Right Left  Radial Palpable Palpable  Brachial Palpable Palpable  Carotid Palpable, No Bruit Palpable, No Bruit  Aorta Not palpable N/A  Femoral Palpable Palpable  Popliteal Not palpable Not palpable  PT Palpable Palpable  DP Palpable Palpable    Gastro- intestinal soft, non-distended, non-tender to palpation, No guarding or rebound, no HSM, no masses, no CVAT B, No palpable prominent aortic pulse,    Musculo- skeletal M/S 5/5 throughout  , Extremities without ischemic changes  , No edema present, No visible varicosities , No Lipodermatosclerosis present  Neurologic Pain and light touch intact in extremities , Motor exam as listed above    Non-Invasive Vascular imaging   ABI (11/14/16)  R:   ABI: 1.11 (0.99),   PT: tri  DP: bi  TBI:  0.87  L:   ABI: 1.12 (1.24),   PT: tri  DP: tri  TBI: 1.02  Aortoiliac Duplex (11/14/16)  Ao: 117 c/s  Stent: 47-169 c/s  Widely patent stent   Medical Decision Making   [name] is a 51 y.o. male who presents with:  right leg intermittent claudication without evidence of critical limb ischemia, APLS, HNP   Based on the patient's vascular studies and examination, I have offered the patient: q3 BLE ABI and aortoiliac duplex to evaluate R CIA stent.  Reportedly all 3 titers were positive for antiphospholipid antibodies.  Defer to Hematology long-term anticoagulation strategy.  Unclear if pt residual R leg sx are related to HNP vs. Reperfusion injury.  Reperfusion injury is usually limited in duration though.  Likewise, unclear to me why pt is having R medial thigh numbness, as the nerve root for  this isn't usually in the dissection plane.  This patient's right femoral exposure was extremely easy, so the femoral nerve was not involved with the dissection.  I discussed in depth with the patient the nature of atherosclerosis, and emphasized the importance of maximal medical management including strict control of blood pressure, blood glucose, and lipid levels, antiplatelet agents, obtaining regular exercise, and cessation of smoking.    The patient is aware that without maximal medical management the underlying atherosclerotic disease process will progress, limiting the benefit of any interventions. The patient is currently on a statin: Crestor.  The patient is currently not on an anti-platelet due to bleeding risks related to use of an anticoagulant.  Pt is on Warfarin for APLS  Thank you for allowing [place] to participate in this patient's care.   [name], MD, FACS [facility] Office: [phone] Pager: [phone]

## 2016-11-17 NOTE — Progress Notes (Signed)
[name] is a 51 y.o. male is here for follow up.  History of Present Illness:   [name] CMA acting as scribe for Dr. Juleen China.  HPI: Patient comes in today for a follow up on his back pian. He is also going to GI and they are wanting him to have a hemorrhoidectomy. Wife has concerns about this and would like to discuss this with Dr. Juleen China.   SEE AP FOR PROBLEM BASED CHARTING.  Health Maintenance Due  Topic Date Due  . HIV Screening  07/08/1982  . TETANUS/TDAP  07/08/1986   PMHx, SurgHx, SocialHx, FamHx, Medications, and Allergies were reviewed in the Visit Navigator and updated as appropriate.   Patient Active Problem List   Diagnosis Date Noted  . 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 Acute bursitis of left shoulder 09/29/2016  . Arthritis of left acromioclavicular joint 09/29/2016  . Monitoring for anticoagulant use 09/15/2016  . Atrial fibrillation ([place]) [I48.91] 09/15/2016  . Acute venous embolism and thrombosis of deep vessels of proximal lower extremity ([place]) [I82.4Y9] 09/15/2016  . Long term (current) use of anticoagulants [Z79.01] 09/15/2016  . Primary hypercoagulable state ([place]) [D68.59] 09/15/2016  . 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 History of alcohol abuse 07/11/2016  . Chronic left shoulder pain 07/08/2016  . Lung nodules 07/08/2016  . Iron deficiency anemia due to chronic blood loss   . Gastroesophageal reflux disease   . Dyslipidemia 06/27/2016  . Antiphospholipid antibody positive 06/27/2016  . Shortness of breath 06/02/2016  . Chest pain 06/02/2016  . Thrombus 06/02/2016  . PAD (peripheral artery disease) ([place]) 06/02/2016  . Claudication ([place]) 06/02/2016  . Effusion of right knee 05/19/2016  . 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 Atherosclerosis of native arteries of extremity with intermittent claudication ([place]) 02/24/2016  . Weakness of right leg 01/11/2016  . Piriformis syndrome of right side 12/29/2015  . Leg  length discrepancy 12/29/2015  . Anxiety with depression 06/24/2014  . Nicotine abuse 06/24/2014   Social History  Substance Use Topics  . Smoking status: Current Every Day Smoker    Packs/day: 0.10    Types: Cigarettes, E-cigarettes  . Smokeless tobacco: Never Used     Comment: 1/2 pk per day.  Vaporizor has 0% nicotine.  Trying to quit  . Alcohol use No     Comment: hx  etoh   Current Medications and Allergies:   .  esomeprazole (NEXIUM) 20 MG capsule, Take 20 mg by mouth daily. , Disp: , Rfl:  .  oxyCODONE-acetaminophen (ROXICET) 5-325 MG tablet, Take 1 tablet by mouth every 8 (eight) hours as needed for severe pain., Disp: 30 tablet, Rfl: 0 .  rosuvastatin (CRESTOR) 5 MG tablet, Take 1 tablet (5 mg total) by mouth daily., Disp: 30 tablet, Rfl: 5 .  venlafaxine XR (EFFEXOR XR) 37.5 MG 24 hr capsule, Take 1 capsule (37.5 mg total) by mouth daily with breakfast., Disp: 30 capsule, Rfl: 1 .  Vitamin D, Ergocalciferol, (DRISDOL) 50000 units CAPS capsule, Take 1 capsule (50,000 Units total) by mouth every 7 (seven) days., Disp: 12 capsule, Rfl: 0 .  warfarin (COUMADIN) 2.5 MG tablet, Take 1 tablet (2.5 mg total) by mouth daily., Disp: 30 tablet, Rfl: 2 .  warfarin (COUMADIN) 4 MG tablet, Take 1 tablet (4 mg total) by mouth daily., Disp: 30 tablet, Rfl: 2 .  gabapentin (NEURONTIN) 300 MG capsule, Take 2 capsules (600 mg total) by mouth 2 (two) times daily., Disp: 120 capsule, Rfl: 11  Allergies  Allergen Reactions  . Cymbalta [Duloxetine Hcl] Other (See Comments)    Headaches  . No Known Allergies    Review of Systems   Review of Systems  All other systems reviewed and are negative.  Vitals:   Vitals:   11/17/16 0951  BP: 138/80  Pulse: 91  Temp: 98.3 F (36.8 C)  TempSrc: Oral  SpO2: 96%  Weight: 190 lb 6.4 oz (86.4 kg)  Height: 5\' 9"  (1.753 m)     Body mass index is 28.12 kg/m.  Physical Exam:   Physical Exam  Constitutional: He is oriented to person, place, and  time. He appears well-developed and well-nourished. No distress.  HENT:  Head: Normocephalic and atraumatic.  Right Ear: External ear normal.  Left Ear: External ear normal.  Nose: Nose normal.  Mouth/Throat: Oropharynx is clear and moist.  Eyes: Conjunctivae and EOM are normal. Pupils are equal, round, and reactive to light.  Neck: Normal range of motion. Neck supple.  Cardiovascular: Normal rate, regular rhythm, normal heart sounds and intact distal pulses.   Pulses:      Dorsalis pedis pulses are 2+ on the right side.       Posterior tibial pulses are 2+ on the right side.  Pulmonary/Chest: Effort normal and breath sounds normal.  Abdominal: Soft. Bowel sounds are normal.  Musculoskeletal: Normal range of motion.       Right foot: There is normal range of motion, no tenderness, no bony tenderness, no swelling, normal capillary refill, no crepitus and no deformity.  Feet:  Right Foot:  Skin Integrity: Negative for ulcer, blister, skin breakdown, erythema, warmth, callus or dry skin.  Neurological: He is alert and oriented to person, place, and time.  Skin: Skin is warm and dry.  Psychiatric: He has a normal mood and affect. His behavior is normal. Judgment and thought content normal.  Nursing note and vitals reviewed.   Assessment and Plan:   Diagnoses and all orders for this visit:  Monitoring for anticoagulant use Comments: INR 3.2. Hold today. Then, continue current regimen.  Chronic right-sided low back pain with right-sided sciatica Comments: MRI (09/30/16):  1. Shallow right foraminal/extraforaminal disc protrusion at L4-5 without direct neural compression but potential irritation the right L4 nerve root. 2. Shallow central and left paracentral disc protrusion at L5-S1 without definite neural compression.  Worsening. Latest vascular testing without concerns. He is interested in [facility] referral at the point for evaluation (possible epidural). Will d/w Drs. Tamala Julian and  [employer]. Orders: -     gabapentin (NEURONTIN) 600 MG tablet; Take 1 tablet (600 mg total) by mouth 3 (three) times daily. -     oxyCODONE-acetaminophen (PERCOCET) 10-325 MG tablet; Take 1 tablet by mouth every 8 (eight) hours as needed for pain.  Nicotine abuse Comments: The patient was counseled on the dangers of tobacco use, and was advised to quit.    Erectile dysfunction Comments: Onset of dysfunction was several months ago and was gradual in onset.  Patient states the nature of difficulty is maintaining erection. Risk factors for ED include PAD though the patient states that he never had problems prior to stopping his alcohol intake. It has worsened since starting the Effexor. Patient's expectations as to sexual function are to be intimate with his wife.  Patient's description of relationship with partner - healthy. Previous treatment of ED includes - none. Orders: -     sildenafil (VIAGRA) 25 MG tablet; 1-2 tabs po x 1. Take 30 minutes to 4 hours  prior to sexual activity.  Iron deficiency anemia due to chronic blood loss Comments: Labs today for recheck. Orders: -     CBC with Differential/Platelet -     IBC panel -     Ferritin  Internal hemorrhoid Comments: Upcoming surgery. Reviewed bowel regimen and expectations. Will be available as needed.    . Reviewed expectations re: course of current medical issues. . Discussed self-management of symptoms. . Outlined signs and symptoms indicating need for more acute intervention. . Patient verbalized understanding and all questions were answered. Marland Kitchen Health Maintenance issues including appropriate healthy diet, exercise, and smoking avoidance were discussed with patient. . See orders for this visit as documented in the electronic medical record. . Patient received an After Visit Summary.  CMA served as Education administrator during this visit. History, Physical, and Plan performed by medical provider. The above documentation has been reviewed and is  accurate and complete. Briscoe Deutscher, D.O.  Briscoe Deutscher, DO [place], [place] 11/17/2016  Future Appointments Date Time Provider [place]  11/17/2016 4:00 PM Conrad Fayette, MD [facility]  11/24/2016 3:00 PM [facility] None

## 2016-11-19 ENCOUNTER — Encounter: Payer: Self-pay | Admitting: Family Medicine

## 2016-11-19 DIAGNOSIS — K648 Other hemorrhoids: Secondary | ICD-10-CM | POA: Insufficient documentation

## 2016-11-19 DIAGNOSIS — M5441 Lumbago with sciatica, right side: Secondary | ICD-10-CM

## 2016-11-19 DIAGNOSIS — G8929 Other chronic pain: Secondary | ICD-10-CM | POA: Insufficient documentation

## 2016-11-19 DIAGNOSIS — N529 Male erectile dysfunction, unspecified: Secondary | ICD-10-CM | POA: Insufficient documentation

## 2016-11-24 ENCOUNTER — Encounter (HOSPITAL_BASED_OUTPATIENT_CLINIC_OR_DEPARTMENT_OTHER): Payer: Self-pay | Admitting: *Deleted

## 2016-11-24 ENCOUNTER — Ambulatory Visit (INDEPENDENT_AMBULATORY_CARE_PROVIDER_SITE_OTHER): Payer: 59 | Admitting: *Deleted

## 2016-11-24 DIAGNOSIS — D6859 Other primary thrombophilia: Secondary | ICD-10-CM

## 2016-11-24 DIAGNOSIS — Z7901 Long term (current) use of anticoagulants: Secondary | ICD-10-CM | POA: Diagnosis not present

## 2016-11-24 DIAGNOSIS — Z5181 Encounter for therapeutic drug level monitoring: Secondary | ICD-10-CM

## 2016-11-24 LAB — POCT INR: INR: 3.7

## 2016-11-24 NOTE — Progress Notes (Signed)
Consider changing patient to one 6 mg tablet. Will discuss with PCP around therapy change consideration.  Patient scheduled Tuesday, July 17th at 3:15pm for follow-up on INR level.

## 2016-11-24 NOTE — Progress Notes (Signed)
I have reviewed this visit and I agree on the patient's plan of dosage and recommendations.    Makayla Lanter, PA    

## 2016-11-27 ENCOUNTER — Encounter: Payer: Self-pay | Admitting: Family Medicine

## 2016-11-27 ENCOUNTER — Encounter (HOSPITAL_BASED_OUTPATIENT_CLINIC_OR_DEPARTMENT_OTHER): Payer: Self-pay | Admitting: *Deleted

## 2016-11-27 NOTE — Progress Notes (Signed)
NPO AFTER MN.  ARRIVE AT 0600.  CURRENT LAB RESULTS AND EKG IN CHART AND EPIC.  (PER PT GETTING IV IRON INFUSION PRIOR TO DOS).  WILL TAKE NEXIUM AND GABAPENTIN AM DOS W/ SIP OF WATER.

## 2016-11-28 NOTE — Addendum Note (Signed)
Addended by: Lianne Cure A on: 11/28/2016 11:06 AM   Modules accepted: Orders

## 2016-11-29 ENCOUNTER — Other Ambulatory Visit: Payer: Self-pay

## 2016-11-29 MED ORDER — VENLAFAXINE HCL ER 37.5 MG PO CP24: 37.5000 mg | ORAL_CAPSULE | Freq: Every day | ORAL | 3 refills | Status: DC

## 2016-11-29 MED FILL — VENLAFAXINE HCL ER 37.5 MG: 37.5 | 30 days supply | Qty: 30 | Fill #0

## 2016-12-03 ENCOUNTER — Encounter: Payer: Self-pay | Admitting: Family Medicine

## 2016-12-05 ENCOUNTER — Ambulatory Visit (INDEPENDENT_AMBULATORY_CARE_PROVIDER_SITE_OTHER): Payer: 59 | Admitting: *Deleted

## 2016-12-05 DIAGNOSIS — Z7901 Long term (current) use of anticoagulants: Secondary | ICD-10-CM

## 2016-12-05 DIAGNOSIS — Z5181 Encounter for therapeutic drug level monitoring: Secondary | ICD-10-CM

## 2016-12-05 DIAGNOSIS — D6859 Other primary thrombophilia: Secondary | ICD-10-CM

## 2016-12-05 LAB — POCT INR: INR: 3

## 2016-12-05 NOTE — Progress Notes (Signed)
Patient states he has surgery scheduled for 12/07/2016. Dr. Juleen China is aware of INR level 3.0 and surgery. Plans to reach out to Dr. Leighton Ruff' team as patient states he does not have a plan to hold or manage coumadin during surgery.   I have reviewed this visit and I agree on the patient's plan of dosage and recommendations. Briscoe Deutscher, DO

## 2016-12-06 MED FILL — SILDENAFIL CITRATE 25 MG TA: 25 | 19 days supply | Qty: 4 | Fill #1

## 2016-12-06 NOTE — Anesthesia Preprocedure Evaluation (Addendum)
Anesthesia Evaluation  Patient identified by MRN, date of birth, ID band Patient awake    Reviewed: Allergy & Precautions, NPO status , Patient's Chart, lab work & pertinent test results  Airway Mallampati: II  TM Distance: >3 FB Neck ROM: Full    Dental no notable dental hx. (+) Teeth Intact, Dental Advisory Given, Caps,    Pulmonary neg pulmonary ROS, Current Smoker,    Pulmonary exam normal breath sounds clear to auscultation       Cardiovascular + Peripheral Vascular Disease  Normal cardiovascular exam Rhythm:Regular Rate:Normal  - Left ventricle: The cavity size was normal. Systolic function was   normal. The estimated ejection fraction was in the range of 55%   to 60%. Wall motion was normal; there were no regional wall   motion abnormalities. The study is not technically sufficient to   allow evaluation of LV diastolic function. Based on mitral inflow   patterns there appears to be grade 1 diastolic dysfunction.   However, based on tissue Doppler, diastolic function is normal. - Aortic valve: Transvalvular velocity was within the normal range.   There was no stenosis. There was no regurgitation. - Mitral valve: Transvalvular velocity was within the normal range.   There was no evidence for stenosis. There was no regurgitation. - Right ventricle: The cavity size was normal. Wall thickness was   normal. Systolic function was normal. - Pulmonic valve: Transvalvular velocity was within the normal   range. There was no evidence for stenosis. - Pulmonary arteries: Systolic pressure was within the normal   range. PA peak pressure: 25 mm Hg (S).   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   Endo/Other  negative endocrine ROS  Renal/GU negative Renal ROS  negative genitourinary   Musculoskeletal negative musculoskeletal ROS (+)   Abdominal   Peds negative pediatric ROS (+)  Hematology Antiphospholipid antibody positive   Anesthesia Other Findings   Reproductive/Obstetrics negative OB ROS                            Anesthesia Physical Anesthesia Plan  ASA: III  Anesthesia Plan: MAC   Post-op Pain Management:    Induction: Intravenous  PONV Risk Score and Plan: 1 and Ondansetron and Dexamethasone  Airway Management Planned: Simple Face Mask  Additional Equipment:   Intra-op Plan:   Post-operative Plan:   Informed Consent: I have reviewed the patients History and Physical, chart, labs and discussed the procedure including the risks, benefits and alternatives for the proposed anesthesia with the patient or authorized representative who has indicated his/her understanding and acceptance.   Dental advisory given  Plan Discussed with: CRNA, Surgeon and Anesthesiologist  Anesthesia Plan Comments:        Anesthesia Quick Evaluation

## 2016-12-07 ENCOUNTER — Encounter (HOSPITAL_BASED_OUTPATIENT_CLINIC_OR_DEPARTMENT_OTHER): Payer: Self-pay

## 2016-12-07 ENCOUNTER — Ambulatory Visit (HOSPITAL_BASED_OUTPATIENT_CLINIC_OR_DEPARTMENT_OTHER)
Admission: RE | Admit: 2016-12-07 | Discharge: 2016-12-07 | Disposition: A | Discharge status: Home / Self Care | Payer: 59 | Source: Ambulatory Visit | Attending: General Surgery | Admitting: General Surgery

## 2016-12-07 ENCOUNTER — Ambulatory Visit (HOSPITAL_BASED_OUTPATIENT_CLINIC_OR_DEPARTMENT_OTHER): Payer: 59 | Admitting: Anesthesiology

## 2016-12-07 ENCOUNTER — Encounter (HOSPITAL_BASED_OUTPATIENT_CLINIC_OR_DEPARTMENT_OTHER)
Admission: RE | Disposition: A | Discharge status: Home / Self Care | Payer: Self-pay | Source: Ambulatory Visit | Attending: General Surgery

## 2016-12-07 DIAGNOSIS — Z7901 Long term (current) use of anticoagulants: Secondary | ICD-10-CM | POA: Diagnosis not present

## 2016-12-07 DIAGNOSIS — K219 Gastro-esophageal reflux disease without esophagitis: Secondary | ICD-10-CM | POA: Insufficient documentation

## 2016-12-07 DIAGNOSIS — K648 Other hemorrhoids: Secondary | ICD-10-CM | POA: Diagnosis not present

## 2016-12-07 DIAGNOSIS — K625 Hemorrhage of anus and rectum: Secondary | ICD-10-CM | POA: Diagnosis not present

## 2016-12-07 DIAGNOSIS — K641 Second degree hemorrhoids: Secondary | ICD-10-CM | POA: Insufficient documentation

## 2016-12-07 DIAGNOSIS — Z79899 Other long term (current) drug therapy: Secondary | ICD-10-CM | POA: Diagnosis not present

## 2016-12-07 DIAGNOSIS — F419 Anxiety disorder, unspecified: Secondary | ICD-10-CM | POA: Diagnosis not present

## 2016-12-07 DIAGNOSIS — K642 Third degree hemorrhoids: Secondary | ICD-10-CM | POA: Insufficient documentation

## 2016-12-07 DIAGNOSIS — G8929 Other chronic pain: Secondary | ICD-10-CM

## 2016-12-07 DIAGNOSIS — I739 Peripheral vascular disease, unspecified: Secondary | ICD-10-CM | POA: Insufficient documentation

## 2016-12-07 DIAGNOSIS — D6861 Antiphospholipid syndrome: Secondary | ICD-10-CM | POA: Diagnosis not present

## 2016-12-07 DIAGNOSIS — F17200 Nicotine dependence, unspecified, uncomplicated: Secondary | ICD-10-CM | POA: Insufficient documentation

## 2016-12-07 DIAGNOSIS — E785 Hyperlipidemia, unspecified: Secondary | ICD-10-CM | POA: Diagnosis not present

## 2016-12-07 DIAGNOSIS — M5441 Lumbago with sciatica, right side: Secondary | ICD-10-CM

## 2016-12-07 DIAGNOSIS — K649 Unspecified hemorrhoids: Secondary | ICD-10-CM | POA: Diagnosis not present

## 2016-12-07 HISTORY — DX: Presence of other vascular implants and grafts: Z95.828

## 2016-12-07 HISTORY — DX: Unspecified osteoarthritis, unspecified site: M19.90

## 2016-12-07 HISTORY — DX: Iron deficiency anemia secondary to blood loss (chronic): D50.0

## 2016-12-07 HISTORY — DX: Presence of spectacles and contact lenses: Z97.3

## 2016-12-07 HISTORY — DX: Polyneuropathy, unspecified: G62.9

## 2016-12-07 HISTORY — DX: Simple chronic bronchitis: J41.0

## 2016-12-07 HISTORY — DX: Personal history of other diseases of the circulatory system: Z86.79

## 2016-12-07 HISTORY — DX: Long term (current) use of anticoagulants: Z79.01

## 2016-12-07 HISTORY — DX: Dorsalgia, unspecified: M54.9

## 2016-12-07 HISTORY — DX: Personal history of colonic polyps: Z86.010

## 2016-12-07 HISTORY — DX: Male erectile dysfunction, unspecified: N52.9

## 2016-12-07 HISTORY — DX: Personal history of adenomatous and serrated colon polyps: Z86.0101

## 2016-12-07 HISTORY — DX: Personal history of other venous thrombosis and embolism: Z86.718

## 2016-12-07 HISTORY — DX: Other chronic pain: G89.29

## 2016-12-07 HISTORY — DX: Alcohol dependence, in remission: F10.21

## 2016-12-07 HISTORY — PX: HEMORRHOID SURGERY: SHX153

## 2016-12-07 HISTORY — DX: Diaphragmatic hernia without obstruction or gangrene: K44.9

## 2016-12-07 HISTORY — DX: Other hemorrhoids: K64.8

## 2016-12-07 SURGERY — HEMORRHOIDECTOMY
Anesthesia: Monitor Anesthesia Care | Site: Rectum

## 2016-12-07 MED ORDER — PROPOFOL 500 MG/50ML IV EMUL
INTRAVENOUS | Status: DC | PRN
  Administered 2016-12-07: 200 ug/kg/min via INTRAVENOUS

## 2016-12-07 MED ORDER — ACETAMINOPHEN 650 MG RE SUPP
650.0000 mg | RECTAL | Status: DC | PRN
  Filled 2016-12-07: qty 1

## 2016-12-07 MED ORDER — SODIUM CHLORIDE 0.9 % IV SOLN
INTRAVENOUS | Status: DC | PRN
  Administered 2016-12-07: 20 ug/kg/min via INTRAVENOUS

## 2016-12-07 MED ORDER — BUPIVACAINE LIPOSOME 1.3 % IJ SUSP
INTRAMUSCULAR | Status: DC | PRN
  Administered 2016-12-07: 20 mL

## 2016-12-07 MED ORDER — FENTANYL CITRATE (PF) 100 MCG/2ML IJ SOLN
25.0000 ug | INTRAMUSCULAR | Status: DC | PRN
  Filled 2016-12-07: qty 1

## 2016-12-07 MED ORDER — ACETAMINOPHEN 325 MG PO TABS
650.0000 mg | ORAL_TABLET | ORAL | Status: DC | PRN
  Filled 2016-12-07: qty 2

## 2016-12-07 MED ORDER — KETOROLAC TROMETHAMINE 30 MG/ML IJ SOLN
INTRAMUSCULAR | Status: AC
  Filled 2016-12-07: qty 2

## 2016-12-07 MED ORDER — LIDOCAINE HCL (CARDIAC) 20 MG/ML IV SOLN
INTRAVENOUS | Status: DC | PRN
  Administered 2016-12-07: 50 mg via INTRAVENOUS

## 2016-12-07 MED ORDER — DIAZEPAM 5 MG PO TABS: 5.0000 mg | ORAL_TABLET | Freq: Three times a day (TID) | ORAL | 0 refills | Status: AC | PRN

## 2016-12-07 MED ORDER — MIDAZOLAM HCL 5 MG/5ML IJ SOLN
INTRAMUSCULAR | Status: DC | PRN
  Administered 2016-12-07: 2 mg via INTRAVENOUS

## 2016-12-07 MED ORDER — DEXAMETHASONE SODIUM PHOSPHATE 10 MG/ML IJ SOLN
INTRAMUSCULAR | Status: AC
  Filled 2016-12-07: qty 1

## 2016-12-07 MED ORDER — SODIUM CHLORIDE 0.9% FLUSH
3.0000 mL | Freq: Two times a day (BID) | INTRAVENOUS | Status: DC
  Filled 2016-12-07: qty 3

## 2016-12-07 MED ORDER — OXYCODONE HCL 5 MG PO TABS
5.0000 mg | ORAL_TABLET | ORAL | Status: DC | PRN
  Filled 2016-12-07: qty 2

## 2016-12-07 MED ORDER — BUPIVACAINE-EPINEPHRINE 0.5% -1:200000 IJ SOLN
INTRAMUSCULAR | Status: DC | PRN
  Administered 2016-12-07: 30 mL

## 2016-12-07 MED ORDER — PROMETHAZINE HCL 25 MG/ML IJ SOLN
6.2500 mg | INTRAMUSCULAR | Status: DC | PRN
  Filled 2016-12-07: qty 1

## 2016-12-07 MED ORDER — LACTATED RINGERS IV SOLN
INTRAVENOUS | Status: DC
  Administered 2016-12-07 (×2): via INTRAVENOUS
  Filled 2016-12-07: qty 1000

## 2016-12-07 MED ORDER — ONDANSETRON HCL 4 MG/2ML IJ SOLN
INTRAMUSCULAR | Status: AC
  Filled 2016-12-07: qty 2

## 2016-12-07 MED ORDER — KETAMINE HCL 10 MG/ML IJ SOLN
INTRAMUSCULAR | Status: AC
  Filled 2016-12-07: qty 1

## 2016-12-07 MED ORDER — OXYCODONE-ACETAMINOPHEN 10-325 MG PO TABS: 1.0000 | ORAL_TABLET | ORAL | 0 refills | Status: DC | PRN

## 2016-12-07 MED ORDER — DEXAMETHASONE SODIUM PHOSPHATE 4 MG/ML IJ SOLN
INTRAMUSCULAR | Status: DC | PRN
  Administered 2016-12-07: 5 mg via INTRAVENOUS

## 2016-12-07 MED ORDER — SODIUM CHLORIDE 0.9% FLUSH
3.0000 mL | INTRAVENOUS | Status: DC | PRN
  Filled 2016-12-07: qty 3

## 2016-12-07 MED ORDER — LIDOCAINE 5 % EX OINT
TOPICAL_OINTMENT | CUTANEOUS | Status: DC | PRN
  Administered 2016-12-07: 1

## 2016-12-07 MED ORDER — PROPOFOL 500 MG/50ML IV EMUL
INTRAVENOUS | Status: AC
  Filled 2016-12-07: qty 50

## 2016-12-07 MED ORDER — FENTANYL CITRATE (PF) 100 MCG/2ML IJ SOLN
INTRAMUSCULAR | Status: AC
  Filled 2016-12-07: qty 2

## 2016-12-07 MED ORDER — FENTANYL CITRATE (PF) 100 MCG/2ML IJ SOLN
INTRAMUSCULAR | Status: DC | PRN
  Administered 2016-12-07 (×2): 25 ug via INTRAVENOUS

## 2016-12-07 MED ORDER — SODIUM CHLORIDE 0.9 % IV SOLN
250.0000 mL | INTRAVENOUS | Status: DC | PRN
  Filled 2016-12-07: qty 250

## 2016-12-07 MED ORDER — MIDAZOLAM HCL 2 MG/2ML IJ SOLN
INTRAMUSCULAR | Status: AC
  Filled 2016-12-07: qty 2

## 2016-12-07 MED FILL — diazePAM 5 MG TABS: 5 | 3 days supply | Qty: 10 | Fill #0

## 2016-12-07 MED FILL — OXYCODONE-APAP 10-325 MG TA: 10-325 | 5 days supply | Qty: 30 | Fill #0

## 2016-12-07 SURGICAL SUPPLY — 39 items
BLADE HEX COATED 2.75 (ELECTRODE) ×2 IMPLANT
BLADE SURG 10 STRL SS (BLADE) ×2 IMPLANT
BRIEF STRETCH FOR OB PAD LRG (UNDERPADS AND DIAPERS) IMPLANT
COVER BACK TABLE 60X90IN (DRAPES) ×2 IMPLANT
COVER MAYO STAND STRL (DRAPES) ×2 IMPLANT
DRAPE LAPAROTOMY 100X72 PEDS (DRAPES) ×2 IMPLANT
DRAPE UTILITY XL STRL (DRAPES) ×2 IMPLANT
DRSG PAD ABDOMINAL 8X10 ST (GAUZE/BANDAGES/DRESSINGS) ×2 IMPLANT
ELECT BLADE 6.5 .24CM SHAFT (ELECTRODE) IMPLANT
ELECT REM PT RETURN 9FT ADLT (ELECTROSURGICAL) ×2
ELECTRODE REM PT RTRN 9FT ADLT (ELECTROSURGICAL) ×1 IMPLANT
GAUZE SPONGE 4X4 16PLY XRAY LF (GAUZE/BANDAGES/DRESSINGS) IMPLANT
GLOVE BIO SURGEON STRL SZ 6.5 (GLOVE) ×2 IMPLANT
GLOVE INDICATOR 7.0 STRL GRN (GLOVE) ×2 IMPLANT
GOWN STRL REUS W/TWL 2XL LVL3 (GOWN DISPOSABLE) ×4 IMPLANT
KIT RM TURNOVER CYSTO AR (KITS) ×2 IMPLANT
NEEDLE HYPO 22GX1.5 SAFETY (NEEDLE) ×2 IMPLANT
NS IRRIG 500ML POUR BTL (IV SOLUTION) ×2 IMPLANT
PACK BASIN DAY SURGERY FS (CUSTOM PROCEDURE TRAY) ×2 IMPLANT
PAD ABD 8X10 STRL (GAUZE/BANDAGES/DRESSINGS) ×2 IMPLANT
PAD ARMBOARD 7.5X6 YLW CONV (MISCELLANEOUS) ×2 IMPLANT
PENCIL BUTTON HOLSTER BLD 10FT (ELECTRODE) ×2 IMPLANT
SPONGE GAUZE 4X4 12PLY (GAUZE/BANDAGES/DRESSINGS) ×2 IMPLANT
SPONGE HEMORRHOID 8X3CM (HEMOSTASIS) ×2 IMPLANT
SPONGE SURGIFOAM ABS GEL 100 (HEMOSTASIS) IMPLANT
SPONGE SURGIFOAM ABS GEL 12-7 (HEMOSTASIS) IMPLANT
SUT CHROMIC 2 0 SH (SUTURE) ×6 IMPLANT
SUT CHROMIC 3 0 SH 27 (SUTURE) ×6 IMPLANT
SUT PROLENE 2 0 BLUE (SUTURE) IMPLANT
SUT VIC AB 2-0 SH 27 (SUTURE)
SUT VIC AB 2-0 SH 27XBRD (SUTURE) IMPLANT
SUT VIC AB 4-0 P-3 18XBRD (SUTURE) IMPLANT
SUT VIC AB 4-0 P3 18 (SUTURE)
SUT VIC AB 4-0 SH 18 (SUTURE) IMPLANT
SYR CONTROL 10ML LL (SYRINGE) ×2 IMPLANT
TRAY DSU PREP LF (CUSTOM PROCEDURE TRAY) ×2 IMPLANT
TUBE CONNECTING 12X1/4 (SUCTIONS) ×2 IMPLANT
WATER STERILE IRR 500ML POUR (IV SOLUTION) ×2 IMPLANT
YANKAUER SUCT BULB TIP NO VENT (SUCTIONS) ×2 IMPLANT

## 2016-12-07 NOTE — Op Note (Addendum)
12/07/2016  8:37 AM  PATIENT:  [name]  51 y.o. male  Patient Care Team: Briscoe Deutscher, DO as PCP - General (Family Medicine) Debara Pickett Nadean Corwin, MD as Consulting Physician (Cardiology) Brunetta Genera, MD as Consulting Physician (Hematology) Clarene Essex, MD as Consulting Physician (Gastroenterology) Conrad Choctaw, MD as Consulting Physician (Vascular Surgery)  PRE-OPERATIVE DIAGNOSIS:  rectal bleeding  POST-OPERATIVE DIAGNOSIS:  rectal bleeding  PROCEDURE:  Procedure(s): 3 COLUMN HEMORRHOIDECTOMY   Surgeon(s): Leighton Ruff, MD  ASSISTANT: none   ANESTHESIA:   local and MAC  SPECIMEN:  Source of Specimen:  hemorrhoid  DISPOSITION OF SPECIMEN:  PATHOLOGY  COUNTS:  YES  PLAN OF CARE: Discharge to home after PACU  PATIENT DISPOSITION:  PACU - hemodynamically stable.  INDICATION: 51 year old male on anticoagulation who presents to the office with bleeding internal hemorrhoids.   OR FINDINGS: Grade 3 right posterior internal hemorrhoid, grade 2 right anterior and left lateral internal hemorrhoids  DESCRIPTION: the patient was identified in the preoperative holding area and taken to the OR where they were laid on the operating room table.  MAC anesthesia was induced without difficulty. The patient was then positioned in prone jackknife position with buttocks gently taped apart.  The patient was then prepped and draped in usual sterile fashion.  SCDs were noted to be in place prior to the initiation of anesthesia. A surgical timeout was performed indicating the correct patient, procedure, positioning and need for preoperative antibiotics.  A rectal block was performed using Marcaine with epinephrine mixed with Exparel.    I began with a digital rectal exam.  There were no abnormal masses.  The patient had good rectal tone.  I then placed a Hill-Ferguson anoscope into the anal canal and evaluated this completely.  There was evidence of a previous posterior fissure.  The patient had a grade 3 right posterior internal hemorrhoid with associated external column. There were grade 2 right anterior left lateral internal hemorrhoids. The left lateral had an associated external column as well. I began with the right posterior hemorrhoid. An incision was made in the skin using a 10 blade scalpel. I dissected the hemorrhoid tissue off of the spector complex using blunt dissection. The hemorrhoidectomy continued with removal of the hemorrhoid tissue with a Harmonic scalpel. The entire specimen was sent to pathology for further examination. I then cleared the external hemorrhoid tissue from the skin edges using blunt dissection and Metzenbaum scissors. The column was closed using a 2-0 chromic suture for the internal anoderm and a 3-0 chromic suture for the skin distal to the dentate line. Hemostasis was good. I proceeded to complete the same procedure on the left lateral internal hemorrhoid and external hemorrhoid as well. I then turned my attention to the right anterior hemorrhoid. This had mainly an internal component. I made an incision with Metzenbaum scissors just above the dentate line and then took this proximally into the anal canal dissecting the hemorrhoid tissue off of the sphincter complex in using blunt dissection. Once this was completed, the harmonics scalpel was used to remove the tissue from the anal canal.  At the end of the procedure hemostasis was good. I placed a Gelfoam pack into the anal canal due to his anticoagulation.  Lidocaine ointment was applied as well. The patient was awakened from sedation and sent to the postanesthesia care unit in stable condition. All counts were correct per operating room staff.  I have reviewed the [employer].  The patient does have an active  prescription for narcotics.  I have given him a one time increase in these meds due to the severely painful nature of surgery.

## 2016-12-07 NOTE — Transfer of Care (Signed)
Immediate Anesthesia Transfer of Care Note  Patient: [name]  Procedure(s) Performed: Procedure(s): 3 COLUMN HEMORRHOIDECTOMY (N/A)  Patient Location: PACU  Anesthesia Type:General  Level of Consciousness: awake, alert , oriented and patient cooperative  Airway & Oxygen Therapy: Patient Spontanous Breathing and Patient connected to nasal cannula oxygen  Post-op Assessment: Report given to RN and Post -op Vital signs reviewed and stable  Post vital signs: Reviewed and stable  Last Vitals:  Vitals:   12/07/16 0620  BP: 117/70  Pulse: 75  Resp: 16  Temp: 36.7 C    Last Pain:  Vitals:   12/07/16 0620  TempSrc: Oral  PainSc: 5       Patients Stated Pain Goal: 5 (63/89/37 [id number])  Complications: No apparent anesthesia complications

## 2016-12-07 NOTE — Anesthesia Postprocedure Evaluation (Signed)
Anesthesia Post Note  Patient: [name]  Procedure(s) Performed: Procedure(s) (LRB): 3 COLUMN HEMORRHOIDECTOMY (N/A)     Patient location during evaluation: PACU Anesthesia Type: MAC Level of consciousness: awake and alert Pain management: pain level controlled Vital Signs Assessment: post-procedure vital signs reviewed and stable Respiratory status: spontaneous breathing, nonlabored ventilation, respiratory function stable and patient connected to nasal cannula oxygen Cardiovascular status: stable and blood pressure returned to baseline Anesthetic complications: no    Last Vitals:  Vitals:   12/07/16 0842 12/07/16 0845  BP: 126/87 120/76  Pulse: 87 87  Resp: 12 15  Temp: 36.6 C     Last Pain:  Vitals:   12/07/16 0620  TempSrc: Oral  PainSc: 5                  [name] S

## 2016-12-07 NOTE — H&P (Signed)
History of Present Illness  The patient is a 52 year old male who presents with a complaint of Rectal bleeding. 51 year old male who presents to the office for evaluation of rectal bleeding. This has been going on for several months. He was seen in the hospital due to a hemoglobin of 6. He underwent a colonoscopy in February 2018 which showed internal hemorrhoids and no other source of bleeding. He has received IV iron treatments and his hemoglobin is back to normal levels. He has a history of antiphospholipid syndrome and peripheral artery disease and therefore needs Coumadin and antiplatelet therapy. He is tried over-the-counter remedies for his hemorrhoids. He does have a history of hemorrhoid excision in the past.   Past Surgical History Malachy Moan, RMA; 10/31/2016 8:55 AM) Bypass Surgery for Poor Blood Flow to Legs  Knee Surgery  Right.  Diagnostic Studies History Malachy Moan, [place]; 10/31/2016 8:55 AM) Colonoscopy  within last year  Allergies Malachy Moan, RMA; 10/31/2016 8:56 AM) Cymbalta *ANTIDEPRESSANTS*   Medication History Malachy Moan, RMA; 10/31/2016 8:59 AM) NexIUM (20MG  Capsule DR, Oral) Active. Gabapentin (300MG  Capsule, Oral) Active. Oxycodone-Acetaminophen (5-325MG  Tablet, Oral) Active. Rosuvastatin Calcium (5MG  Tablet, Oral) Active. Venlafaxine HCl ER (37.5MG  Capsule ER 24HR, Oral) Active. Vitamin D (Ergocalciferol) (50000UNIT Capsule, Oral) Active. Warfarin Sodium (2.5MG  Tablet, Oral) Active. Warfarin Sodium (4MG  Tablet, Oral) Active. Warfarin Sodium (5MG  Tablet, Oral) Active. Medications Reconciled  Social History Malachy Moan, [place]; 10/31/2016 8:55 AM) Alcohol use  Recently quit alcohol use. Caffeine use  Carbonated beverages, Coffee, Tea. Illicit drug use  Prefer to discuss with provider. Tobacco use  Current every day smoker.  Family History Malachy Moan, [place]; 10/31/2016 8:55 AM) Cancer  Father. Colon  Polyps  Mother. Diabetes Mellitus  Father. Heart Disease  Father. Hypertension  Father. Respiratory Condition  Father.  Other Problems Malachy Moan, RMA; 10/31/2016 8:55 AM) Alcohol Abuse  Anxiety Disorder  Back Pain  Gastroesophageal Reflux Disease  Hemorrhoids  Pulmonary Embolism / Blood Clot in Legs  Transfusion history  Vascular Disease     Review of Systems  General Not Present- Appetite Loss, Chills, Fatigue, Fever, Night Sweats, Weight Gain and Weight Loss. Skin Not Present- Change in Wart/Mole, Dryness, Hives, Jaundice, New Lesions, Non-Healing Wounds, Rash and Ulcer. HEENT Not Present- Earache, Hearing Loss, Hoarseness, Nose Bleed, Oral Ulcers, Ringing in the Ears, Seasonal Allergies, Sinus Pain, Sore Throat, Visual Disturbances, Wears glasses/contact lenses and Yellow Eyes. Respiratory Not Present- Bloody sputum, Chronic Cough, Difficulty Breathing, Snoring and Wheezing. Cardiovascular Not Present- Chest Pain, Difficulty Breathing Lying Down, Leg Cramps, Palpitations, Rapid Heart Rate, Shortness of Breath and Swelling of Extremities. Gastrointestinal Present- Hemorrhoids and Indigestion. Not Present- Abdominal Pain, Bloating, Bloody Stool, Change in Bowel Habits, Chronic diarrhea, Constipation, Difficulty Swallowing, Excessive gas, Gets full quickly at meals, Nausea, Rectal Pain and Vomiting. Male Genitourinary Not Present- Blood in Urine, Change in Urinary Stream, Frequency, Impotence, Nocturia, Painful Urination, Urgency and Urine Leakage. Musculoskeletal Present- Back Pain. Not Present- Joint Pain, Joint Stiffness, Muscle Pain, Muscle Weakness and Swelling of Extremities. Neurological Not Present- Decreased Memory, Fainting, Headaches, Numbness, Seizures, Tingling, Tremor, Trouble walking and Weakness. Psychiatric Present- Anxiety. Not Present- Bipolar, Change in Sleep Pattern, Depression, Fearful and Frequent crying. Endocrine Not Present- Cold Intolerance,  Excessive Hunger, Hair Changes, Heat Intolerance, Hot flashes and New Diabetes. Hematology Present- Blood Thinners. Not Present- Easy Bruising, Excessive bleeding, Gland problems, HIV and Persistent Infections.  BP 117/70   Pulse 75   Temp 98 F (36.7 C) (Oral)  Resp 16   Ht 5\' 9"  (1.753 m)   Wt 82.7 kg (182 lb 5 oz)   SpO2 98%   BMI 26.92 kg/m     Physical Exam  General Mental Status-Alert. General Appearance-Not in acute distress. Build & Nutrition-Well nourished. Posture-Normal posture. Gait-Normal.  Head and Neck Head-normocephalic, atraumatic with no lesions or palpable masses. Trachea-midline.  Chest and Lung Exam Chest and lung exam reveals -on auscultation, normal breath sounds, no adventitious sounds and normal vocal resonance.  Cardiovascular Cardiovascular examination reveals -normal heart sounds, regular rate and rhythm with no murmurs and no digital clubbing, cyanosis, edema, increased warmth or tenderness.  Abdomen Inspection Inspection of the abdomen reveals - No Hernias. Palpation/Percussion Palpation and Percussion of the abdomen reveal - Soft, Non Tender, No Rigidity (guarding), No hepatosplenomegaly and No Palpable abdominal masses.  Rectal Anorectal Exam External - normal external exam. Internal - normal internal exam and normal sphincter tone.  Neurologic Neurologic evaluation reveals -alert and oriented x 3 with no impairment of recent or remote memory, normal attention span and ability to concentrate, normal sensation and normal coordination.  Musculoskeletal Normal Exam - Bilateral-Upper Extremity Strength Normal and Lower Extremity Strength Normal.   ANOSCOPY, DIAGNOSTIC (37169) [ Hemorrhoids ] Procedure Other: Procedure: Anoscopy Surgeon: Marcello Moores After the risks and benefits were explained, verbal consent was obtained for above procedure. A medical assistant chaperone was present thoroughout the entire procedure.  Anesthesia: none Diagnosis: Rectal bleeding Findings: Grade 2, large right posterior and left lateral internal hemorrhoids. Smaller grade 2 right anterior internal hemorrhoid. All 3 with stigmata of bleeding.  Performed: 10/31/2016 9:27 AM    Assessment & Plan  BLEEDING INTERNAL HEMORRHOIDS (K64.8) Impression: 51 year old male who presents to the office for evaluation of bleeding internal hemorrhoids. He is on anticoagulation and is required to stay on this. We discussed treatment options including rubber band ligation excision and transfemoral dearterialization. We discussed that all have significant bleeding risk. I think that bleeding Maudie Mercury be relatively well controlled during the operative procedure. Hopefully bleeding afterwards will be self-limited and similar to what he is experiencing now. After weighing the options, he has decided to undergo hemorrhoid excision. He will follow up with his doctor regarding his hemoglobin level. We will then plan on doing this surgery as soon as possible.

## 2016-12-07 NOTE — Discharge Instructions (Addendum)
°Post Anesthesia Home Care Instructions ° °Activity: °Get plenty of rest for the remainder of the day. A responsible individual must stay with you for 24 hours following the procedure.  °For the next 24 hours, DO NOT: °-Drive a car °-Operate machinery °-Drink alcoholic beverages °-Take any medication unless instructed by your physician °-Make any legal decisions or sign important papers. ° °Meals: °Start with liquid foods such as gelatin or soup. Progress to regular foods as tolerated. Avoid greasy, spicy, heavy foods. If nausea and/or vomiting occur, drink only clear liquids until the nausea and/or vomiting subsides. Call your physician if vomiting continues. ° °Special Instructions/Symptoms: °Your throat may feel dry or sore from the anesthesia or the breathing tube placed in your throat during surgery. If this causes discomfort, gargle with warm salt water. The discomfort should disappear within 24 hours. ° °If you had a scopolamine patch placed behind your ear for the management of post- operative nausea and/or vomiting: ° °1. The medication in the patch is effective for 72 hours, after which it should be removed.  Wrap patch in a tissue and discard in the trash. Wash hands thoroughly with soap and water. °2. You may remove the patch earlier than 72 hours if you experience unpleasant side effects which may include dry mouth, dizziness or visual disturbances. °3. Avoid touching the patch. Wash your hands with soap and water after contact with the patch. °  °ANORECTAL SURGERY: POST OP INSTRUCTIONS °1. Take your usually prescribed home medications unless otherwise directed. °2. DIET: During the first few hours after surgery sip on some liquids until you are able to urinate.  It is normal to not urinate for several hours after this surgery.  If you feel uncomfortable, please contact the office for instructions.  After you are able to urinate,you may eat, if you feel like it.  Follow a light bland diet the first 24  hours after arrival home, such as soup, liquids, crackers, etc.  Be sure to include lots of fluids daily (6-8 glasses).  Avoid fast food or heavy meals, as your are more likely to get nauseated.  Eat a low fat diet the next few days after surgery.  Limit caffeine intake to 1-2 servings a day. °3. PAIN CONTROL: °a. Pain is best controlled by a usual combination of several different methods TOGETHER: °i. Muscle relaxation °1.  Soak in a warm bath (or Sitz bath) three times a day and after bowel movements.  Continue to do this until all pain is resolved. °2. Take the muscle relaxer (Valium) every 6 hours for the first 2 days after surgery  °ii. Over the counter pain medication °iii. Prescription pain medication °b. Most patients will experience some swelling and discomfort in the anus/rectal area and incisions.  Heat such as warm towels, sitz baths, warm baths, etc to help relax tight/sore spots and speed recovery.  Some people prefer to use ice, especially in the first couple days after surgery, as it may decrease the pain and swelling, or alternate between ice & heat.  Experiment to what works for you.  Swelling and bruising can take several weeks to resolve.  Pain can take even longer to completely resolve. °c. It is helpful to take an over-the-counter pain medication regularly for the first few weeks.  Choose one of the following that works best for you: °i. Naproxen (Aleve, etc)  Two 220mg tabs twice a day °ii. Ibuprofen (Advil, etc) Three 200mg tabs four times a day (every meal & bedtime) °  d. A  prescription for pain medication (such as percocet, oxycodone, hydrocodone, etc) should be given to you upon discharge.  Take your pain medication as prescribed.  °i. If you are having problems/concerns with the prescription medicine (does not control pain, nausea, vomiting, rash, itching, etc), please call us [phone] to see if we need to switch you to a different pain medicine that will work better for you and/or  control your side effect better. °ii. If you need a refill on your pain medication, please contact your pharmacy.  They will contact our office to request authorization. Prescriptions will not be filled after 5 pm or on week-ends. °4. KEEP YOUR BOWELS REGULAR and AVOID CONSTIPATION °a. The goal is one to two soft bowel movements a day.  You should at least have a bowel movement every other day. °b. Avoid getting constipated.  Between the surgery and the pain medications, it is common to experience some constipation. This can be very painful after rectal surgery.  Increasing fluid intake and taking a fiber supplement (such as Metamucil, Citrucel, FiberCon, etc) 1-2 times a day regularly will usually help prevent this problem from occurring.  A stool softener like colace is also recommended.  This can be purchased over the counter at your pharmacy.  You can take it up to 3 times a day.  If you do not have a bowel movement after 24 hrs since your surgery, take one does of milk of magnesia.  If you still haven't had a bowel movement 8-12 hours after that dose, take another dose.  If you don't have a bowel movement 48 hrs after surgery, purchase a Fleets enema from the drug store and administer gently per package instructions.  If you still are having trouble with your bowel movements after that, please call the office for further instructions. °c. If you develop diarrhea or have many loose bowel movements, simplify your diet to bland foods & liquids for a few days.  Stop any stool softeners and decrease your fiber supplement.  Switching to mild anti-diarrheal medications (Kayopectate, Pepto Bismol) can help.  If this worsens or does not improve, please call us. ° °5. Wound Care °a. Remove your bandages before your first bowel movement or 8 hours after surgery.     °b. Remove any wound packing material at this tim,e as well.  You do not need to repack the wound unless instructed otherwise.  Wear an absorbent pad or soft  cotton gauze in your underwear to catch any drainage and help keep the area clean. You should change this every 2-3 hours while awake. °c. Keep the area clean and dry.  Bathe / shower every day, especially after bowel movements.  Keep the area clean by showering / bathing over the incision / wound.   It is okay to soak an open wound to help wash it.  Wet wipes or showers / gentle washing after bowel movements is often less traumatic than regular toilet paper. °d. You may have some styrofoam-like soft packing in the rectum which will come out with the first bowel movement.  °e. You will often notice bleeding with bowel movements.  This should slow down by the end of the first week of surgery °f. Expect some drainage.  This should slow down, too, by the end of the first week of surgery.  Wear an absorbent pad or soft cotton gauze in your underwear until the drainage stops. °g. Do Not sit on a rubber or pillow ring.    This can make you symptoms worse.  You may sit on a soft pillow if needed.  °6. ACTIVITIES as tolerated:   °a. You may resume regular (light) daily activities beginning the next day--such as daily self-care, walking, climbing stairs--gradually increasing activities as tolerated.  If you can walk 30 minutes without difficulty, it is safe to try more intense activity such as jogging, treadmill, bicycling, low-impact aerobics, swimming, etc. °b. Save the most intensive and strenuous activity for last such as sit-ups, heavy lifting, contact sports, etc  Refrain from any heavy lifting or straining until you are off narcotics for pain control.   °c. You may drive when you are no longer taking prescription pain medication, you can comfortably sit for long periods of time, and you can safely maneuver your car and apply brakes. °d. You may have sexual intercourse when it is comfortable.  °7. FOLLOW UP in our office °a. Please call [facility] at [phone] to set up an appointment to see your surgeon in the office for  a follow-up appointment approximately 3-4 weeks after your surgery. °b. Make sure that you call for this appointment the day you arrive home to insure a convenient appointment time. °10. IF YOU HAVE DISABILITY OR FAMILY LEAVE FORMS, BRING THEM TO THE OFFICE FOR PROCESSING.  DO NOT GIVE THEM TO YOUR DOCTOR. ° ° ° ° °WHEN TO CALL US [phone]: °1. Poor pain control °2. Reactions / problems with new medications (rash/itching, nausea, etc)  °3. Fever over 101.5 F (38.5 C) °4. Inability to urinate °5. Nausea and/or vomiting °6. Worsening swelling or bruising °7. Continued bleeding from incision. °8. Increased pain, redness, or drainage from the incision ° °The clinic staff is available to answer your questions during regular business hours (8:30am-5pm).  Please don’t hesitate to call and ask to speak to one of our nurses for clinical concerns.   A surgeon from [facility] is always on call at the hospitals °  °If you have a medical emergency, go to the nearest emergency room or call 911. °  ° °[facility] °[address], [address], [place], [place]  [address] ? °MAIN: [phone] ? TOLL FREE: [phone] ? °FAX [phone] °[email or web address] ° ° ° °

## 2016-12-07 NOTE — Anesthesia Procedure Notes (Signed)
Procedure Name: MAC Date/Time: 12/07/2016 7:30 AM Performed by: Wanita Chamberlain Pre-anesthesia Checklist: Patient identified, Emergency Drugs available, Suction available, Patient being monitored and Timeout performed Patient Re-evaluated:Patient Re-evaluated prior to induction Oxygen Delivery Method: Nasal cannula Induction Type: IV induction Placement Confirmation: CO2 detector Dental Injury: Teeth and Oropharynx as per pre-operative assessment

## 2016-12-08 ENCOUNTER — Encounter (HOSPITAL_BASED_OUTPATIENT_CLINIC_OR_DEPARTMENT_OTHER): Payer: Self-pay | Admitting: General Surgery

## 2016-12-10 MED FILL — ROSUVASTATIN CALCIUM 5 MG T: 5 | 30 days supply | Qty: 30 | Fill #5

## 2016-12-12 MED FILL — oxyCODONE HCL 5 MG TABS: 5 | 3 days supply | Qty: 40 | Fill #0

## 2016-12-18 NOTE — Progress Notes (Signed)
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  HISTORY OF PRESENTING ILLNESS:   [name] is a wonderful 51 y.o. 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  Patient has a history of smoking 1-1/2 packs per day for about 40 years, dyslipidemia but no other known significant chronic medical comorbidities. He has not had a primary care physician and routine screenings.  He presented to the vascular surgeon in September/October 2017 with claudication of his lower extremities especially cramping discomfort in his buttocks and right thigh and was noted to have a reduced ABI of 0.8 on the right side. 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 He underwent right common iliac artery stenting with significant improvement in his right lower extremity claudication symptoms. He was apparently placed on Plavix at the time.  He presented again in late December 2017 with acute worsening of his right lower extremity pain and had repeat angiography on 05/11/2016 which showed the previously placed common iliac stent was occluded and this was retreated. No obvious etiology was noted.  Patient was started on aspirin in addition to Plavix. Concern for a  clotting disorder versus incomplete stent expansion versus edge dissection were entertained. Patient had anticardiolipin antibody and beta-2 glycoprotein antibody testing. He was noted to have a significantly elevated beta-2 glycoprotein IgG that was concerning for antiphospholipid antibody syndrome.  Patient was tentatively plan to have arterial bypass surgery on 07/11/2016 with Dr. Bridgett Larsson. He has been seen by Dr. Debara Pickett and had a nuclear cardiac stress test which showed a slightly reduced ejection fraction of 44% but negative for ischemia. Repeat echo showed normal ejection fraction.  Patient has had some mild anemia with hemoglobin of 10.5 when he first had his cardiac angiogram of 10.2 on the subsequent angiogram. He does not report any overt bleeding on his aspirin plus Plavix. Does note some mild hemorrhoidal bleeding on and off for the last 1 year.   INTERVAL HISTORY  [name] is here for his scheduled followup for antiphospholipid antibody syndrome. He has had right iliac artery stent thrombectomy without needing a vascular bypass and continues to be on Coumadin for anticoagulation.   He notes no other new thrombotic events. His hemoglobin is stable at 13.2 and the ferritin has improved from 11 about 2 months ago to 160. He notes that he is still having some bleeding from his internal hemorrhoids and that he has been referred by Dr. Loletha Carrow to see Dr Leighton Ruff at [facility] to address his hemorrhoids. No other acute new symptoms.  MEDICAL HISTORY:  Past Medical History:  Diagnosis Date  . Anticoagulated on Coumadin   . Antiphospholipid antibody positive 06/27/2016   followed by dr Cammie Faulstich ([facility])  . Anxiety with depression 06/24/2014  . Chronic back pain   . ED (erectile dysfunction)   . GERD (gastroesophageal reflux disease)   . Hemorrhoids, internal, with bleeding   .  Hiatal hernia   . History of adenomatous polyp of colon    07-08-2016  tubular adenoma's  . History of  atrial fibrillation    episode 04/ 2018  . History of DVT of lower extremity   . Hyperlipidemia   . Iron deficiency anemia due to chronic blood loss followed by dr Irene Limbo ([facility])   from hemorrhoids and on coumadin-- hx Iron infusion's and blood transfusion's x2 07-09-2016  . Neuropathy, peripheral    right foot numbness from lumbar pinched nerve  . OA (osteoarthritis)   . PAD (peripheral artery disease) ([place]) followed by dr Bridgett Larsson--  per LOV note ABI 11-14-2016  widely patent stent/  11-27-2016 per pt no claudication symptoms   hx right CIA angioplasty and stent 02-24-2016/  right CIA thromectomy and patch angioplasty for restenosis 07-18-2015  . Primary hypercoagulable state ([place]) [D68.59] 09/15/2016  . Recovering alcoholic in remission ([place])    per pt since 04-25-2016  . S/P insertion of iliac artery stent    02-23-2017  right CIA PTA and stent/  05-11-2016  in-stent restenosis  s/p  thrombectomy/  07-17-2016 thrombectomy and  patch angioplasty right femoral artery   . Smokers' cough ([place])   . Wears glasses     SURGICAL HISTORY: Past Surgical History:  Procedure Laterality Date  . ANGIOPLASTY ILLIAC ARTERY Right 07/17/2016   Procedure: ANGIOPLASTY RIGHT COMMON ILIAC ARTERY;  Surgeon: Conrad [place], MD;  Location: [facility];  Service: Vascular;  Laterality: Right;  . ANTERIOR CERVICAL DECOMP/DISCECTOMY FUSION  07/21/2010   C5 -- C7  . CARDIOVASCULAR STRESS TEST  06-14-2016   dr hilty   normal perfusion study w/ no reversible ischemia/  stress ef 44% but visually looks better , echo ordered (LVEF 30-44%)  , normal LV wall motion   . COLONOSCOPY N/A 07/08/2016   Procedure: COLONOSCOPY;  Surgeon: Clarene Essex, MD;  Location: [facility];  Service: Endoscopy;  Laterality: N/A;  . ESOPHAGOGASTRODUODENOSCOPY N/A 07/08/2016   Procedure: ESOPHAGOGASTRODUODENOSCOPY (EGD);  Surgeon: Clarene Essex, MD;  Location: [facility];  Service: Endoscopy;  Laterality: N/A;  . GROIN DEBRIDEMENT Right  07/25/2016   Procedure: EVACUATION HEMATOMA RIGHT GROIN;  Surgeon: Waynetta Sandy, MD;  Location: [facility];  Service: Vascular;  Laterality: Right;  . HEMORRHOID SURGERY  2013  approx.   ?lanced/ banding  . HEMORRHOID SURGERY N/A 12/07/2016   Procedure: 3 COLUMN HEMORRHOIDECTOMY;  Surgeon: Leighton Ruff, MD;  Location: [facility];  Service: General;  Laterality: N/A;  . INTRAOPERATIVE ARTERIOGRAM Right 07/17/2016   Procedure: INTRA OPERATIVE ANGIOGRAM OF RIGHT COMMON ILIAC ARTERY;  Surgeon: Conrad [place], MD;  Location: [place] [facility];  Service: Vascular;  Laterality: Right;  . KNEE SURGERY Right 1983  . PATCH ANGIOPLASTY Right 07/17/2016   Procedure: PATCH ANGIOPLASTY RIGHT FEMORAL ARTERY USING Rueben Bash BIOLOGIC PATCH;  Surgeon: Conrad Moffat, MD;  Location: [facility];  Service: Vascular;  Laterality: Right;  . PERIPHERAL VASCULAR CATHETERIZATION N/A 02/24/2016   Procedure: Abdominal Aortogram w/Lower Extremity;  Surgeon: Conrad [place], MD;  Location: [facility];  Service: Cardiovascular;  Laterality: N/A;  . PERIPHERAL VASCULAR CATHETERIZATION Right 02/24/2016   Procedure: Peripheral Vascular Intervention;  Surgeon: Conrad Berkey, MD;  Location: [place] [facility];  Service: Cardiovascular;  Laterality: Right;  Common iliac  . PERIPHERAL VASCULAR CATHETERIZATION N/A 05/11/2016   Procedure: Abdominal Aortogram;  Surgeon: Conrad , MD;  Location: [place] [facility];  Service: Cardiovascular;  Laterality: N/A;  .  PERIPHERAL VASCULAR CATHETERIZATION N/A 05/11/2016   Procedure: Lower Extremity Angiography;  Surgeon: Conrad [place], MD;  Location: [facility];  Service: Cardiovascular;  Laterality: N/A;  . THROMBECTOMY FEMORAL ARTERY Right 07/17/2016   Procedure: THROMBECTOMY RIGHT FEMORAL ARTERY;  Surgeon: Conrad , MD;  Location: [facility];  Service: Vascular;  Laterality: Right;  . TRANSTHORACIC ECHOCARDIOGRAM  06-20-2016   dr hilty   ef 55-60%,  grade 1 diastolic dysfunction/   mild TR    SOCIAL HISTORY: Social History   Social History  . Marital status: Married    Spouse name: N/A  . Number of children: N/A  . Years of education: N/A   Occupational History  . Not on file.   Social History Main Topics  . Smoking status: Current Every Day Smoker    Packs/day: 1.00    Years: 33.00    Types: Cigarettes, E-cigarettes  . Smokeless tobacco: Never Used  . Alcohol use No     Comment: per pt recovering alcoholic in remission since 04-25-2016  . Drug use: No  . Sexual activity: Yes   Other Topics Concern  . Not on file   Social History Narrative  . No narrative on file    FAMILY HISTORY: Family History  Problem Relation Age of Onset  . Cancer Father        Lung    ALLERGIES:  is allergic to cymbalta [duloxetine hcl].  MEDICATIONS:  Current Outpatient Prescriptions  Medication Sig Dispense Refill  . esomeprazole (NEXIUM) 20 MG capsule Take 20 mg by mouth every morning.     . rosuvastatin (CRESTOR) 5 MG tablet Take 1 tablet (5 mg total) by mouth daily. (Patient taking differently: Take 5 mg by mouth every morning. ) 30 tablet 5  . Vitamin D, Ergocalciferol, (DRISDOL) 50000 units CAPS capsule Take 1 capsule (50,000 Units total) by mouth every 7 (seven) days. 12 capsule 0  . warfarin (COUMADIN) 5 MG tablet Take 1 tablet (5 mg total) by mouth daily. 30 tablet 0  . diazepam (VALIUM) 5 MG tablet Take 1 tablet (5 mg total) by mouth every 8 (eight) hours as needed (urinary retention). 10 tablet 0  . gabapentin (NEURONTIN) 600 MG tablet Take 1 tablet (600 mg total) by mouth 3 (three) times daily. 90 tablet 3  . oxyCODONE-acetaminophen (PERCOCET) 10-325 MG tablet Take 1 tablet by mouth every 4 (four) hours as needed for pain. 30 tablet 0  . sildenafil (VIAGRA) 25 MG tablet 1-2 tabs po x 1. Take 30 minutes to 4 hours prior to sexual activity. 10 tablet 0  . venlafaxine XR (EFFEXOR XR) 37.5 MG 24 hr capsule Take 1 capsule (37.5 mg total) by mouth daily with  breakfast. 30 capsule 3  . warfarin (COUMADIN) 2.5 MG tablet Take 1 tablet (2.5 mg total) by mouth daily. 30 tablet 2  . warfarin (COUMADIN) 4 MG tablet Take 1 tablet (4 mg total) by mouth daily. 30 tablet 2   No current facility-administered medications for this visit.     REVIEW OF SYSTEMS:    10 Point review of Systems was done is negative except as noted above.  PHYSICAL EXAMINATION: ECOG PERFORMANCE STATUS: 1 - Symptomatic but completely ambulatory  . Vitals:   10/23/16 0901  BP: 134/73  Pulse: 65  Resp: 18  Temp: 98.2 F (36.8 C)   Filed Weights   10/23/16 0901  Weight: 191 lb 9.6 oz (86.9 kg)   .Body mass index is 28.29 kg/m.  GENERAL:alert,  in no acute distress and comfortable SKIN: skin color, texture, turgor are normal, no rashes or significant lesions EYES: normal, conjunctiva are pink and non-injected, sclera clear OROPHARYNX:no exudate, no erythema and lips, buccal mucosa, and tongue normal  NECK: supple, no JVD, thyroid normal size, non-tender, without nodularity LYMPH:  no palpable lymphadenopathy in the cervical, axillary or inguinal LUNGS: clear to auscultation with normal respiratory effort HEART: regular rate & rhythm,  no murmurs and no lower extremity edema ABDOMEN: abdomen soft, non-tender, normoactive bowel sounds  Musculoskeletal: Right lower extremity degrees posterior tibial and DP pulses. PSYCH: alert & oriented x 3 with fluent speech NEURO: no focal motor/sensory deficits  LABORATORY DATA:  I have reviewed the data as listed  . CBC Latest Ref Rng & Units 10/23/2016 09/11/2016  WBC 4.0 - 10.5 K/uL 10.4(H) 13.7(H)  Hemoglobin 13.0 - 17.0 g/dL 13.2 13.1  Hematocrit 39.0 - 52.0 % 40.6 40.1  Platelets 150.0 - 400.0 K/uL 190 269    . CMP Latest Ref Rng & Units 10/23/2016 09/11/2016 07/18/2016  Glucose 70 - 140 mg/dl 106 87 113(H)  BUN 7.0 - 26.0 mg/dL 8.0 11.5 5(L)  Creatinine 0.7 - 1.3 mg/dL 1.0 1.0 0.94  Sodium 136 - 145 mEq/L 140 139 138    Potassium 3.5 - 5.1 mEq/L 4.2 4.3 3.9  Chloride 101 - 111 mmol/L - - 104  CO2 22 - 29 mEq/L 20(L) 26 27  Calcium 8.4 - 10.4 mg/dL 9.4 9.6 8.6(L)  Total Protein 6.4 - 8.3 g/dL 6.9 7.3 -  Total Bilirubin 0.20 - 1.20 mg/dL 0.48 0.70 -  Alkaline Phos 40 - 150 U/L 105 95 -  AST 5 - 34 U/L 24 18 -  ALT 0 - 55 U/L 37 30 -   Component     Latest Ref Rng & Units 06/20/2016         8:37 AM  Phosphatidylserine IgG Autoantibodies     <10 U/mL <10  Phosphatydalserine, IgM     <25 U/mL <25  Phosphatydalserine, IgA     <20 U/mL <20  Beta-2 Glycoprotein I Ab, IgG     <=20 SGU 150 (H)  Beta-2-Glycoprotein I IgM     <=20 SMU 10  Beta-2-Glycoprotein I IgA     <=20 SAU 11  Anticardiolipin Ab,IgA,Qn     APL <11  Anticardiolipin Ab,IgG,Qn     GPL <14  Anticardiolipin Ab,IgM,Qn     MPL <12  Result      REPORT  Interpretation      REPORT  Reviewer      REPORT  Interpretation        Reviewer        Act.Prt.C Resist.     >=2.1 ratio 4.3  Antithrombin Activity     80 - 120 % activity 118   Component     Latest Ref Rng & Units 10/23/2016  PTT-LA     0.0 - 51.9 sec 43.3  DRVVT     0.0 - 47.0 sec 70.0 (H)  dRVVT Mix     0.0 - 47.0 sec 44.4  Lupus Anticoag Interp      Comment:  Beta-2 Glycoprotein I Ab, IgG     0 - 20 GPI IgG units 46 (H)  Beta-2 Glyco 1 IgA     0 - 25 GPI IgA units <9  Beta-2 Glyco 1 IgM     0 - 32 GPI IgM units <9  Anticardiolipin Ab,IgG,Qn     0 - 14 GPL  U/mL <9  Anticardiolipin Ab,IgM,Qn     0 - 12 MPL U/mL <9  Anticardiolipin Ab,IgA,Qn     0 - 11 APL U/mL <9    RADIOGRAPHIC STUDIES: I have personally reviewed the radiological images as listed and agreed with the findings in the report. No results found.  ASSESSMENT & PLAN:   51 year old gentleman with history of dyslipidemia and chronic smoker with  1) Rt common Iliac artery thrombosis. This was initially noted to be related to an arterial dissection. Other risk factors at baseline include strong  history of smoking.  Patient's right common iliac artery was stented and he had a repeat thrombosis while on Plavix noted on 05/11/2016. Aspirin was added at this time. Patient notes that he still having right buttock and thigh claudications and some coldness and paresthesias intermittently in his right foot but that these are much improved.  Noted to have elevated beta-2 glycoprotein IgG with titers that were significantly elevated at 150 Rpt titers today lower at 46 but still significant  2) Elevated beta-2 glycoprotein IgG - cannot r/o antiphospholipid antibody syndrome as a result of this. To prove this is clinical significant will need to demonstrate persistence of ab in 12 weeks. Rpt titers today lower at 46 but still significant Given his presentation - there is enough concern that we would need to treat this as APLA syndrome.  3) severe iron deficiency anemia. Unclear source of blood loss. He has had some chronic hemorrhoidal bleeding which could be part of the reason. Recent EGD and colonoscopy did not show any overt signs of bleeding or concerning lesions. He was cleared by gastroenterology for ongoing anticoagulation. Has int and external hemorrhoids. Hgb is stable. Ferritin is improved from 11 to 160. Patient still has ongoing GI bleeding from his hemorrhoids . PLAN -No indication for additional IV iron at this time. -Follow-up with Dr. Leighton Ruff M.D. and redness bleeding internal hemorrhoids. -Repeat antiphospholipid antibody testing still shows significantly elevated beta-2 glycoprotein IgG titers -Would recommend long-term anticoagulation with Coumadin. -Continue follow-up with primary care doctor for Coumadin management .  F/u with Dr Irene Limbo on an as needed basis.  All of the patients questions were answered with apparent satisfaction. The patient knows to call the clinic with any problems, questions or concerns.  I spent 20 minutes counseling the patient face to face.  The total time spent in the appointment was 25 minutes and more than 50% was on counseling and direct patient cares.    Sullivan Lone MD [place] AAHIVMS [facility] Hematology/Oncology Physician [facility]  (Office):       [phone] (Work cell):  [phone] (Fax):           [phone]

## 2016-12-25 MED FILL — WARFARIN SODIUM 4 MG TABLET: 4 | 30 days supply | Qty: 30 | Fill #2

## 2016-12-25 MED FILL — WARFARIN NA 2.5 MG TAB: 2.5 | 30 days supply | Qty: 30 | Fill #2

## 2016-12-26 ENCOUNTER — Ambulatory Visit (INDEPENDENT_AMBULATORY_CARE_PROVIDER_SITE_OTHER): Payer: 59 | Admitting: *Deleted

## 2016-12-26 ENCOUNTER — Encounter: Payer: Self-pay | Admitting: Family Medicine

## 2016-12-26 DIAGNOSIS — Z5181 Encounter for therapeutic drug level monitoring: Secondary | ICD-10-CM

## 2016-12-26 LAB — POCT INR: INR: 2.7

## 2016-12-27 ENCOUNTER — Other Ambulatory Visit: Payer: Self-pay | Admitting: *Deleted

## 2016-12-27 ENCOUNTER — Other Ambulatory Visit: Payer: Self-pay

## 2016-12-27 DIAGNOSIS — M542 Cervicalgia: Secondary | ICD-10-CM

## 2016-12-27 DIAGNOSIS — M5441 Lumbago with sciatica, right side: Principal | ICD-10-CM

## 2016-12-27 DIAGNOSIS — G8929 Other chronic pain: Secondary | ICD-10-CM

## 2016-12-31 ENCOUNTER — Encounter: Payer: Self-pay | Admitting: Family Medicine

## 2017-01-01 ENCOUNTER — Other Ambulatory Visit: Payer: Self-pay

## 2017-01-01 DIAGNOSIS — N529 Male erectile dysfunction, unspecified: Secondary | ICD-10-CM

## 2017-01-01 DIAGNOSIS — Z0279 Encounter for issue of other medical certificate: Secondary | ICD-10-CM | POA: Diagnosis not present

## 2017-01-01 MED ORDER — SILDENAFIL CITRATE 25 MG PO TABS: ORAL_TABLET | ORAL | 0 refills | Status: DC

## 2017-01-01 MED FILL — VENLAFAXINE HCL ER 37.5 MG: 37.5 | 30 days supply | Qty: 30 | Fill #1

## 2017-01-03 ENCOUNTER — Telehealth: Payer: Self-pay | Admitting: *Deleted

## 2017-01-03 MED ORDER — ENOXAPARIN SODIUM 150 MG/ML ~~LOC~~ SOLN: 1.0000 mg/kg | Freq: Two times a day (BID) | SUBCUTANEOUS | 3 refills | Status: DC

## 2017-01-03 NOTE — Addendum Note (Signed)
Addended by: Briscoe Deutscher R on: 01/03/2017 11:32 AM   Modules accepted: Orders

## 2017-01-03 NOTE — Telephone Encounter (Signed)
Received information patient will be receiving an epidural. Spoke with Dr. Juleen China and planned the below schedule of Lovenox bridge prior to and after procedure:  Day 1: Last day of Coumadin Day 2: No Coumadin and no Lovenox Day 3: Lovenox x2 (or x1) whatever dosage is appropriate Day 4: Lovenox x2 (or x1) whatever dosage is appropriate Day 5: Lovenox in AM only Day 6: Procedure Day 7: Lovenox + usual daily dosage of Coumadin Day 8: Lovenox + usual daily dosage of Coumadin Day 9: Lovenox + usual daily dosage of Coumadin Day 10:Lovenox + usual daily dosage of Coumadin Day 11: Recheck Coumadin in AM --> If patient is greater than or equal to 2, stop Lovenox & continue usually Coumadin dosage routine   Will continue to collaborate with Dr. Juleen China prior to and after procedure to ensure smooth transition of anticoagulants.

## 2017-01-04 ENCOUNTER — Other Ambulatory Visit: Payer: Self-pay | Admitting: *Deleted

## 2017-01-04 DIAGNOSIS — N529 Male erectile dysfunction, unspecified: Secondary | ICD-10-CM

## 2017-01-04 DIAGNOSIS — Z7901 Long term (current) use of anticoagulants: Secondary | ICD-10-CM

## 2017-01-04 MED ORDER — SILDENAFIL CITRATE 25 MG PO TABS: ORAL_TABLET | ORAL | 0 refills | Status: DC

## 2017-01-04 MED ORDER — ENOXAPARIN SODIUM 100 MG/ML ~~LOC~~ SOLN: 1.0000 mg/kg | Freq: Two times a day (BID) | SUBCUTANEOUS | 0 refills | Status: DC

## 2017-01-04 MED FILL — ENOXAPARIN 100 MG/ML SYR: 100 | 6 days supply | Qty: 13 | Fill #0

## 2017-01-04 NOTE — Telephone Encounter (Signed)
Patient's wife stated Patient's procedure is scheduled for August 24th. Collaborating with Dr. Juleen China, we have communicated with the patient around the below bridge schedule:  August 19th: Last day of Coumadin (Take Coumadin as scheduled)  August 20th: No Coumadin and no Lovenox August 21st: Lovenox - twice daily  August 22nd: Lovenox - twice daily  August 23rd: Lovenox - once in AM only  / Appointment at 3:45pm with Sharyn Lull to check INR levels  August 24th: Procedure Day  August 25th: Lovenox  -- twice daily + usual daily dosage of Coumadin August 26th: Lovenox - twice daily + usual daily dosage of Coumadin August 27th: Lovenox - twice daily + usual daily dosage of Coumadin August 28th: Lovenox - twice daily + usual daily dosage of Coumadin August 29th: No Lovenox or Coumadin until appointment with Sharyn Lull at 8:00am to check INR levels   Total # of injections: 13   Message was related to patient and wife. They understand instructions. Will see patient on August 23rd to check levels.

## 2017-01-04 NOTE — Progress Notes (Signed)
Patient's wife (confirmed on [place]) called requesting Viagra be sent to [facility]. Viagra sent in.   Patient's wife also called in making aware procedure has been scheduled for August 24th and inquiring on Lovenox bridging schedule. See Lovenox note.

## 2017-01-05 MED FILL — SILDENAFIL CITRATE 25 MG TA: 25 | 90 days supply | Qty: 18 | Fill #0

## 2017-01-08 ENCOUNTER — Other Ambulatory Visit: Payer: Self-pay | Admitting: Internal Medicine

## 2017-01-08 MED FILL — ROSUVASTATIN CALCIUM 5 MG T: 5 | 30 days supply | Qty: 30 | Fill #0

## 2017-01-10 NOTE — Addendum Note (Signed)
Addendum  created 01/10/17 1214 by Albertha Ghee, MD   Sign clinical note

## 2017-01-11 ENCOUNTER — Ambulatory Visit (INDEPENDENT_AMBULATORY_CARE_PROVIDER_SITE_OTHER): Payer: 59 | Admitting: *Deleted

## 2017-01-11 DIAGNOSIS — Z5181 Encounter for therapeutic drug level monitoring: Secondary | ICD-10-CM | POA: Diagnosis not present

## 2017-01-11 DIAGNOSIS — Z7901 Long term (current) use of anticoagulants: Secondary | ICD-10-CM | POA: Diagnosis not present

## 2017-01-11 DIAGNOSIS — D6859 Other primary thrombophilia: Secondary | ICD-10-CM

## 2017-01-11 LAB — POCT INR
INR: 1.3
INR: 1.3

## 2017-01-11 NOTE — Progress Notes (Signed)
I have reviewed this visit and I agree on the patient's plan of dosage and recommendations. Itai Barbian, DO   

## 2017-01-12 ENCOUNTER — Ambulatory Visit
Admission: RE | Admit: 2017-01-12 | Discharge: 2017-01-12 | Disposition: A | Discharge status: Home / Self Care | Payer: 59 | Source: Ambulatory Visit | Attending: Family Medicine | Admitting: Family Medicine

## 2017-01-12 DIAGNOSIS — M542 Cervicalgia: Secondary | ICD-10-CM

## 2017-01-12 DIAGNOSIS — M47812 Spondylosis without myelopathy or radiculopathy, cervical region: Secondary | ICD-10-CM | POA: Diagnosis not present

## 2017-01-12 MED ORDER — IOPAMIDOL (ISOVUE-M 300) INJECTION 61%
1.0000 mL | Freq: Once | INTRAMUSCULAR | Status: AC | PRN
  Administered 2017-01-12: 1 mL via EPIDURAL

## 2017-01-12 MED ORDER — TRIAMCINOLONE ACETONIDE 40 MG/ML IJ SUSP (RADIOLOGY)
60.0000 mg | Freq: Once | INTRAMUSCULAR | Status: AC
  Administered 2017-01-12: 60 mg via EPIDURAL

## 2017-01-12 NOTE — Discharge Instructions (Signed)

## 2017-01-17 ENCOUNTER — Ambulatory Visit (INDEPENDENT_AMBULATORY_CARE_PROVIDER_SITE_OTHER): Payer: 59 | Admitting: *Deleted

## 2017-01-17 DIAGNOSIS — Z5181 Encounter for therapeutic drug level monitoring: Secondary | ICD-10-CM | POA: Diagnosis not present

## 2017-01-17 DIAGNOSIS — D6859 Other primary thrombophilia: Secondary | ICD-10-CM

## 2017-01-17 DIAGNOSIS — Z7901 Long term (current) use of anticoagulants: Secondary | ICD-10-CM

## 2017-01-17 LAB — POCT INR: INR: 1.7

## 2017-01-17 MED ORDER — ENOXAPARIN SODIUM 100 MG/ML ~~LOC~~ SOLN: 1.0000 mg/kg | Freq: Two times a day (BID) | SUBCUTANEOUS | 0 refills | Status: DC

## 2017-01-17 MED FILL — ENOXAPARIN 100 MG/ML SYR: 100 | 3 days supply | Qty: 3 | Fill #0

## 2017-01-17 NOTE — Progress Notes (Signed)
I have reviewed this visit and I agree on the patient's plan of dosage and recommendations. Icarus Partch, MD   

## 2017-01-17 NOTE — Progress Notes (Signed)
Spoke with Dr. Jerline Pain and he stated "patient has a high risk for clot. We should bridge him with lovenox until his INR is >2. "   Discussed the below plan and Dr. Jerline Pain agreed on such:  August 29th: Lovenox today + daily Coumadin dosage August 30th: Lovenox BID + daily Coumadin dosage August 31st: Arrive in office in AM for INR check   *Spoke with patient and he is aware. Will send lovenox in to pharmacy. Patient scheduled at 8:00am on Friday.

## 2017-01-19 ENCOUNTER — Other Ambulatory Visit: Payer: Self-pay | Admitting: Family Medicine

## 2017-01-19 ENCOUNTER — Ambulatory Visit: Payer: 59

## 2017-01-19 ENCOUNTER — Ambulatory Visit (INDEPENDENT_AMBULATORY_CARE_PROVIDER_SITE_OTHER): Payer: 59 | Admitting: *Deleted

## 2017-01-19 DIAGNOSIS — Z5181 Encounter for therapeutic drug level monitoring: Secondary | ICD-10-CM | POA: Diagnosis not present

## 2017-01-19 DIAGNOSIS — Z7901 Long term (current) use of anticoagulants: Secondary | ICD-10-CM

## 2017-01-19 DIAGNOSIS — G8929 Other chronic pain: Secondary | ICD-10-CM

## 2017-01-19 DIAGNOSIS — D6859 Other primary thrombophilia: Secondary | ICD-10-CM | POA: Diagnosis not present

## 2017-01-19 DIAGNOSIS — M5441 Lumbago with sciatica, right side: Principal | ICD-10-CM

## 2017-01-19 LAB — POCT INR: INR: 1.7

## 2017-01-19 MED ORDER — ENOXAPARIN SODIUM 100 MG/ML ~~LOC~~ SOLN: 1.0000 mg/kg | Freq: Two times a day (BID) | SUBCUTANEOUS | 0 refills | Status: DC

## 2017-01-19 MED ORDER — OXYCODONE-ACETAMINOPHEN 10-325 MG PO TABS: 1.0000 | ORAL_TABLET | ORAL | 0 refills | Status: DC | PRN

## 2017-01-19 MED FILL — OXYCODONE-APAP 10-325 MG TA: 10-325 | 5 days supply | Qty: 30 | Fill #0

## 2017-01-19 MED FILL — ENOXAPARIN 100 MG/ML SYR: 100 | 6 days supply | Qty: 12 | Fill #0

## 2017-01-19 NOTE — Progress Notes (Signed)
31st: Lovenox twice a day + 2-4mg  tablets and 2-2.5 mg tablets  1st: Lovenox twice a day + daily dosage of Coumadin 2nd: Lovenox twice a day + daily dosage of Coumadin 3rd: Lovenox twice a day + daily dosage of Coumadin 4th: Lovenox twice a day + daily dosage of Coumadin 5th: Lovenox twice a day + daily dosage of Coumadin  I have reviewed this visit and I agree on the patient's plan of dosage and recommendations. Briscoe Deutscher, DO

## 2017-01-22 ENCOUNTER — Encounter: Payer: Self-pay | Admitting: Family Medicine

## 2017-01-23 ENCOUNTER — Other Ambulatory Visit: Payer: Self-pay

## 2017-01-23 ENCOUNTER — Telehealth: Payer: Self-pay | Admitting: Family Medicine

## 2017-01-23 DIAGNOSIS — Z7901 Long term (current) use of anticoagulants: Secondary | ICD-10-CM

## 2017-01-23 MED ORDER — ENOXAPARIN SODIUM 100 MG/ML ~~LOC~~ SOLN: 1.0000 mg/kg | Freq: Two times a day (BID) | SUBCUTANEOUS | 0 refills | Status: DC

## 2017-01-23 MED ORDER — WARFARIN SODIUM 4 MG PO TABS: 4.0000 mg | ORAL_TABLET | Freq: Every day | ORAL | 2 refills | Status: DC

## 2017-01-23 MED FILL — GABAPENTIN 600 MG TABLET: 600 | 30 days supply | Qty: 90 | Fill #1

## 2017-01-23 MED FILL — ENOXAPARIN 100 MG/ML SYR: 100 | 1 days supply | Qty: 2 | Fill #0

## 2017-01-23 MED FILL — WARFARIN SODIUM 4 MG TABLET: 4 | 30 days supply | Qty: 30 | Fill #0

## 2017-01-23 MED FILL — GABAPENTIN 300 MG CAPSULE: 300 | 30 days supply | Qty: 120 | Fill #2

## 2017-01-23 NOTE — Telephone Encounter (Signed)
Patient has already picked up his Lovenox prescription.  He is not concerned about PA.

## 2017-01-23 NOTE — Telephone Encounter (Signed)
[facility] calling in reference to PA for generic lovenox. Ref # [id number]. Please call [facility] and advise.

## 2017-01-24 NOTE — Telephone Encounter (Signed)
Paperwork with clinical questions in Dr. Alcario Drought folder.  -LL

## 2017-01-24 NOTE — Telephone Encounter (Signed)
Clinical question that need answering RE Levonox script.  REF# [id number]  Please call back.  Ty,  -LL

## 2017-01-26 ENCOUNTER — Telehealth: Payer: Self-pay | Admitting: *Deleted

## 2017-01-26 ENCOUNTER — Ambulatory Visit: Payer: 59 | Admitting: *Deleted

## 2017-01-26 DIAGNOSIS — Z5181 Encounter for therapeutic drug level monitoring: Secondary | ICD-10-CM

## 2017-01-26 DIAGNOSIS — D6859 Other primary thrombophilia: Secondary | ICD-10-CM

## 2017-01-26 DIAGNOSIS — Z7901 Long term (current) use of anticoagulants: Secondary | ICD-10-CM

## 2017-01-26 LAB — POCT INR: INR: 2.7

## 2017-01-26 NOTE — Telephone Encounter (Signed)
Patient no longer on medication which required the PA. Left voicemail for patient to call office to clarify questions and paperwork. Awaiting call back.

## 2017-01-26 NOTE — Telephone Encounter (Signed)
Spoke to patient. He no longer needs PA and has no further questions

## 2017-01-26 NOTE — Progress Notes (Signed)
I have reviewed this visit and I agree on the patient's plan of dosage and recommendations.    Inda Coke, [place]

## 2017-01-26 NOTE — Telephone Encounter (Signed)
FYI -- Patient would like his Flu and TDap at his next visit with Dr. Juleen China. Patient would also like to discuss new labs being ordered for him.

## 2017-01-28 ENCOUNTER — Encounter: Payer: Self-pay | Admitting: Family Medicine

## 2017-01-28 MED FILL — VENLAFAXINE HCL ER 37.5 MG: 37.5 | 30 days supply | Qty: 30 | Fill #2

## 2017-01-29 ENCOUNTER — Other Ambulatory Visit: Payer: Self-pay

## 2017-01-29 DIAGNOSIS — Z7901 Long term (current) use of anticoagulants: Secondary | ICD-10-CM

## 2017-01-29 MED ORDER — WARFARIN SODIUM 2.5 MG PO TABS: 2.5000 mg | ORAL_TABLET | Freq: Every day | ORAL | 2 refills | Status: DC

## 2017-01-29 MED FILL — WARFARIN NA 2.5 MG TAB: 2.5 | 30 days supply | Qty: 30 | Fill #0

## 2017-01-31 NOTE — Telephone Encounter (Signed)
Spoke with patient. He is aware that he can get vaccines during visit and talk about labs at appt.

## 2017-02-13 ENCOUNTER — Encounter: Payer: Self-pay | Admitting: Family Medicine

## 2017-02-14 ENCOUNTER — Other Ambulatory Visit: Payer: Self-pay

## 2017-02-14 MED ORDER — ROSUVASTATIN CALCIUM 5 MG PO TABS: 5.0000 mg | ORAL_TABLET | Freq: Every day | ORAL | 3 refills | Status: DC

## 2017-02-14 MED FILL — ROSUVASTATIN CALCIUM 5 MG T: 5 | 30 days supply | Qty: 30 | Fill #0

## 2017-02-16 DIAGNOSIS — Z0271 Encounter for disability determination: Secondary | ICD-10-CM

## 2017-02-20 NOTE — Progress Notes (Signed)
Established Intermittent Claudication   History of Present Illness   [name] is a 51 y.o. (10-Jun-1965) male  who presents with chief complaint: right calf neuropathic sx.  Pt recently had a MRI which demonstrated HNP at a couple of levels. Pt is going to get epidural injection  Prior procedures include: 1. TE R CIA, PTA R CIA (07/17/16) 2. R CIA PTA+S (02/24/16)  The patient's symptoms have not progressed.  The patient's symptoms are: right calf neuropathy sx and some R medial thigh numbness.  The patient's treatment regimen currently included: maximal medical management.  The patient's PMH, PSH, and SH, and FamHx are unchanged from 08/11/16.  Current Outpatient Prescriptions  Medication Sig Dispense Refill  . enoxaparin (LOVENOX) 100 MG/ML injection Inject 0.85 mLs (85 mg total) into the skin every 12 (twelve) hours. *Reference bridging schedule per provider instructions 2 Syringe 0  . esomeprazole (NEXIUM) 20 MG capsule Take 20 mg by mouth every morning.     . gabapentin (NEURONTIN) 600 MG tablet Take 1 tablet (600 mg total) by mouth 3 (three) times daily. 90 tablet 3  . oxyCODONE-acetaminophen (PERCOCET) 10-325 MG tablet Take 1 tablet by mouth every 4 (four) hours as needed for pain. 30 tablet 0  . rosuvastatin (CRESTOR) 5 MG tablet Take 1 tablet (5 mg total) by mouth daily. 30 tablet 3  . sildenafil (VIAGRA) 25 MG tablet 2-3 tabs po x 1. Take 30 minutes to 4 hours prior to sexual activity. 21 tablet 0  . venlafaxine XR (EFFEXOR XR) 37.5 MG 24 hr capsule Take 1 capsule (37.5 mg total) by mouth daily with breakfast. 30 capsule 3  . Vitamin D, Ergocalciferol, (DRISDOL) 50000 units CAPS capsule Take 1 capsule (50,000 Units total) by mouth every 7 (seven) days. 12 capsule 0  . warfarin (COUMADIN) 2.5 MG tablet Take 1 tablet (2.5 mg total) by mouth daily. 30 tablet 2  . warfarin (COUMADIN) 4 MG tablet Take 1 tablet (4 mg total) by mouth daily. 30 tablet 2  . warfarin  (COUMADIN) 5 MG tablet Take 1 tablet (5 mg total) by mouth daily. 30 tablet 0   No current facility-administered medications for this visit.     On ROS today: neuropathic sx R leg, HNP L-spine.   Physical Examination   Vitals:   02/23/17 0919  BP: 119/80  Pulse: 61  Resp: 16  SpO2: 100%  Weight: 180 lb (81.6 kg)  Height: 5\' 9"  (1.753 m)   Body mass index is 26.58 kg/m.  General Alert, O x 3, WD, NAD  Pulmonary Sym exp, good air movt  Cardiac RRR, Nl S1, S2,  Musculo- skeletal Normal gait without ischemic limbs    Non-Invasive Vascular imaging   ABI (02/23/2017)  R:   ABI: 1.13 (1.11),   PT: tri  DP: tri  TBI:  0.99  L:   ABI: 1.16 (1.12),   PT: tri  DP: tri  TBI: 1.11  R Aortoiliac Duplex (02/23/2017)  Ao: 170 c/s  Stent: 152-170 c/s  Ptent stent   Medical Decision Making   [name] is a 51 y.o. ([birth year]) male right leg intermittent claudication without evidence of critical limb ischemia, APLS, HNP   Suspect the patient R calf sx are unrelated to his PAD.  Will see if epidural injection help with sx.  I discussed in depth with the patient the nature of atherosclerosis, and emphasized the importance of maximal medical management including strict control of blood pressure, blood glucose, and lipid  levels, antiplatelet agents, obtaining regular exercise, and cessation of smoking.    The patient is aware that without maximal medical management the underlying atherosclerotic disease process will progress, limiting the benefit of any interventions.  The patient is currently on a statin: Crestor.   The patient is currently not on an anti-platelet due to bleeding risks related to use of an anticoagulant.  Pt is on Warfarin for APLS  Thank you for allowing [place] to participate in this patient's care.   [name], MD, FACS [facility] Office: [phone] Pager: [phone]

## 2017-02-23 ENCOUNTER — Encounter: Payer: Self-pay | Admitting: Family Medicine

## 2017-02-23 ENCOUNTER — Ambulatory Visit (INDEPENDENT_AMBULATORY_CARE_PROVIDER_SITE_OTHER): Payer: 59 | Admitting: Family Medicine

## 2017-02-23 ENCOUNTER — Ambulatory Visit (HOSPITAL_COMMUNITY)
Admission: RE | Admit: 2017-02-23 | Discharge: 2017-02-23 | Disposition: A | Discharge status: Home / Self Care | Payer: 59 | Source: Ambulatory Visit | Attending: Vascular Surgery | Admitting: Vascular Surgery

## 2017-02-23 ENCOUNTER — Ambulatory Visit (INDEPENDENT_AMBULATORY_CARE_PROVIDER_SITE_OTHER): Payer: 59 | Admitting: *Deleted

## 2017-02-23 ENCOUNTER — Encounter: Payer: Self-pay | Admitting: Vascular Surgery

## 2017-02-23 ENCOUNTER — Ambulatory Visit (INDEPENDENT_AMBULATORY_CARE_PROVIDER_SITE_OTHER): Payer: 59 | Admitting: Vascular Surgery

## 2017-02-23 ENCOUNTER — Ambulatory Visit (INDEPENDENT_AMBULATORY_CARE_PROVIDER_SITE_OTHER)
Admission: RE | Admit: 2017-02-23 | Discharge: 2017-02-23 | Disposition: A | Discharge status: Home / Self Care | Payer: 59 | Source: Ambulatory Visit | Attending: Vascular Surgery | Admitting: Vascular Surgery

## 2017-02-23 VITALS — BP 132/78 | HR 79 | Temp 98.1°F | Ht 69.0 in | Wt 181.0 lb

## 2017-02-23 VITALS — BP 119/80 | HR 61 | Resp 16 | Ht 69.0 in | Wt 180.0 lb

## 2017-02-23 DIAGNOSIS — Z23 Encounter for immunization: Secondary | ICD-10-CM

## 2017-02-23 DIAGNOSIS — R05 Cough: Secondary | ICD-10-CM

## 2017-02-23 DIAGNOSIS — E785 Hyperlipidemia, unspecified: Secondary | ICD-10-CM | POA: Insufficient documentation

## 2017-02-23 DIAGNOSIS — Z720 Tobacco use: Secondary | ICD-10-CM | POA: Diagnosis not present

## 2017-02-23 DIAGNOSIS — N529 Male erectile dysfunction, unspecified: Secondary | ICD-10-CM | POA: Diagnosis not present

## 2017-02-23 DIAGNOSIS — I739 Peripheral vascular disease, unspecified: Secondary | ICD-10-CM | POA: Insufficient documentation

## 2017-02-23 DIAGNOSIS — F17200 Nicotine dependence, unspecified, uncomplicated: Secondary | ICD-10-CM | POA: Diagnosis not present

## 2017-02-23 DIAGNOSIS — D6859 Other primary thrombophilia: Secondary | ICD-10-CM

## 2017-02-23 DIAGNOSIS — I70211 Atherosclerosis of native arteries of extremities with intermittent claudication, right leg: Secondary | ICD-10-CM

## 2017-02-23 DIAGNOSIS — D500 Iron deficiency anemia secondary to blood loss (chronic): Secondary | ICD-10-CM | POA: Diagnosis not present

## 2017-02-23 DIAGNOSIS — Z5181 Encounter for therapeutic drug level monitoring: Secondary | ICD-10-CM

## 2017-02-23 DIAGNOSIS — M2351 Chronic instability of knee, right knee: Secondary | ICD-10-CM | POA: Diagnosis not present

## 2017-02-23 DIAGNOSIS — G8929 Other chronic pain: Secondary | ICD-10-CM | POA: Diagnosis not present

## 2017-02-23 DIAGNOSIS — M5441 Lumbago with sciatica, right side: Secondary | ICD-10-CM

## 2017-02-23 DIAGNOSIS — R760 Raised antibody titer: Secondary | ICD-10-CM | POA: Insufficient documentation

## 2017-02-23 DIAGNOSIS — Z7901 Long term (current) use of anticoagulants: Secondary | ICD-10-CM | POA: Diagnosis not present

## 2017-02-23 DIAGNOSIS — I745 Embolism and thrombosis of iliac artery: Secondary | ICD-10-CM | POA: Diagnosis not present

## 2017-02-23 DIAGNOSIS — R059 Cough, unspecified: Secondary | ICD-10-CM

## 2017-02-23 LAB — COMPREHENSIVE METABOLIC PANEL
ALT: 15 U/L (ref 0–53)
AST: 11 U/L (ref 0–37)
Albumin: 4.4 g/dL (ref 3.5–5.2)
Alkaline Phosphatase: 92 U/L (ref 39–117)
BUN: 11 mg/dL (ref 6–23)
CO2: 26 mEq/L (ref 19–32)
Calcium: 9.3 mg/dL (ref 8.4–10.5)
Chloride: 104 mEq/L (ref 96–112)
Creatinine, Ser: 0.79 mg/dL (ref 0.40–1.50)
GFR: 110.51 mL/min (ref >60.00)
Glucose, Bld: 113 mg/dL — ABNORMAL HIGH (ref 70–99)
Potassium: 3.7 mEq/L (ref 3.5–5.1)
Sodium: 138 mEq/L (ref 135–145)
Total Bilirubin: 0.6 mg/dL (ref 0.2–1.2)
Total Protein: 7.4 g/dL (ref 6.0–8.3)

## 2017-02-23 LAB — CBC WITH DIFFERENTIAL/PLATELET
Basophils Absolute: 0.1 10*3/uL (ref 0.0–0.1)
Basophils Relative: 0.9 % (ref 0.0–3.0)
Eosinophils Absolute: 0.6 10*3/uL (ref 0.0–0.7)
Eosinophils Relative: 4.5 % (ref 0.0–5.0)
HCT: 34.7 % — ABNORMAL LOW (ref 39.0–52.0)
Hemoglobin: 10.2 g/dL — ABNORMAL LOW (ref 13.0–17.0)
Lymphocytes Relative: 19.5 % (ref 12.0–46.0)
Lymphs Abs: 2.7 10*3/uL (ref 0.7–4.0)
MCHC: 29.3 g/dL — ABNORMAL LOW (ref 30.0–36.0)
MCV: 72.4 fl — ABNORMAL LOW (ref 78.0–100.0)
Monocytes Absolute: 0.6 10*3/uL (ref 0.1–1.0)
Monocytes Relative: 4.2 % (ref 3.0–12.0)
Neutro Abs: 9.6 10*3/uL — ABNORMAL HIGH (ref 1.4–7.7)
Neutrophils Relative %: 70.9 % (ref 43.0–77.0)
Platelets: 336 10*3/uL (ref 150.0–400.0)
RBC: 4.8 Mil/uL (ref 4.22–5.81)
RDW: 21.6 % — ABNORMAL HIGH (ref 11.5–15.5)
WBC: 13.6 10*3/uL — ABNORMAL HIGH (ref 4.0–10.5)

## 2017-02-23 LAB — FERRITIN: Ferritin: 3.9 ng/mL — ABNORMAL LOW (ref 22.0–322.0)

## 2017-02-23 LAB — POCT INR: INR: 1.5

## 2017-02-23 LAB — VITAMIN B12: Vitamin B-12: 555 pg/mL (ref 211–911)

## 2017-02-23 MED ORDER — OXYCODONE-ACETAMINOPHEN 10-325 MG PO TABS: 1.0000 | ORAL_TABLET | ORAL | 0 refills | Status: DC | PRN

## 2017-02-23 MED ORDER — VARENICLINE TARTRATE 0.5 MG X 11 & 1 MG X 42 PO MISC: ORAL | 0 refills | Status: DC

## 2017-02-23 MED ORDER — SILDENAFIL CITRATE 25 MG PO TABS: ORAL_TABLET | ORAL | 2 refills | Status: DC

## 2017-02-23 MED ORDER — PREGABALIN 200 MG PO CAPS: 200.0000 mg | ORAL_CAPSULE | Freq: Two times a day (BID) | ORAL | 0 refills | Status: DC

## 2017-02-23 MED ORDER — ALBUTEROL SULFATE HFA 108 (90 BASE) MCG/ACT IN AERS: 2.0000 | INHALATION_SPRAY | Freq: Four times a day (QID) | RESPIRATORY_TRACT | 2 refills | Status: DC | PRN

## 2017-02-23 MED ORDER — DOXYCYCLINE HYCLATE 100 MG PO TABS: 100.0000 mg | ORAL_TABLET | Freq: Two times a day (BID) | ORAL | 0 refills | Status: DC

## 2017-02-23 MED FILL — OXYCODONE-APAP 10-325 MG TA: 10-325 | 5 days supply | Qty: 30 | Fill #0

## 2017-02-23 MED FILL — DOXYCYCLINE HYC 100 MG TAB: 100 | 10 days supply | Qty: 20 | Fill #0

## 2017-02-23 MED FILL — VENTOLIN HFA 90 MCG INHALER: 108 (90 BAS | 25 days supply | Qty: 18 | Fill #0

## 2017-02-23 MED FILL — LYRICA 200 MG CAPSULE: 200 | 30 days supply | Qty: 60 | Fill #0

## 2017-02-23 MED FILL — CHANTIX STARTING MONTH BOX: 0.5 MG X 11 | 30 days supply | Qty: 53 | Fill #0

## 2017-02-23 NOTE — Progress Notes (Signed)
[name] is a 51 y.o. male is here for follow up.  History of Present Illness:   Water quality scientist, CMA, acting as scribe for Dr. Juleen China.  HPI: See Assessment and Plan section for Problem Based Charting of issues discussed today.  Health Maintenance Due  Topic Date Due  . HIV Screening  07/08/1982   Depression screen [facility] 2/9 07/08/2016 07/05/2016 03/08/2015  Decreased Interest 0 1 0  Down, Depressed, Hopeless 0 0 0  PHQ - 2 Score 0 1 0   PMHx, SurgHx, SocialHx, FamHx, Medications, and Allergies were reviewed in the Visit Navigator and updated as appropriate.   Patient Active Problem List   Diagnosis Date Noted  . Erectile dysfunction 11/19/2016  . Chronic right-sided low back pain with right-sided sciatica 11/19/2016  . Internal hemorrhoid 11/19/2016  . 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 Arthritis of left acromioclavicular joint 09/29/2016  . Monitoring for anticoagulant use 09/15/2016  . Long term (current) use of anticoagulants [Z79.01] 09/15/2016  . Primary hypercoagulable state ([place]) [D68.59] 09/15/2016  . History of alcohol abuse 07/11/2016  . Chronic left shoulder pain 07/08/2016  . Iron deficiency anemia due to chronic blood loss   . Gastroesophageal reflux disease   . Dyslipidemia 06/27/2016  . Antiphospholipid antibody positive 06/27/2016  . Shortness of breath 06/02/2016  . Thrombus 06/02/2016  . PAD (peripheral artery disease) ([place]) 06/02/2016  . Claudication ([place]) 06/02/2016  . 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 Atherosclerosis of native arteries of extremity with intermittent claudication ([place]) 02/24/2016  . Weakness of right leg 01/11/2016  . Piriformis syndrome of right side 12/29/2015  . Leg length discrepancy 12/29/2015  . Anxiety with depression 06/24/2014  . Nicotine abuse 06/24/2014   Social History  Substance Use Topics  . Smoking status: Current Every Day Smoker    Packs/day: 1.00    Years: 33.00    Types: Cigarettes, E-cigarettes   . Smokeless tobacco: Never Used  . Alcohol use No     Comment: per pt recovering alcoholic in remission since 04-25-2016   Current Medications and Allergies:   .  esomeprazole (NEXIUM) 20 MG capsule, Take 20 mg by mouth every morning. , Disp: , Rfl:  .  gabapentin (NEURONTIN) 600 MG tablet, Take 1 tablet (600 mg total) by mouth 3 (three) times daily., Disp: 90 tablet, Rfl: 3 .  oxyCODONE-acetaminophen (PERCOCET) 10-325 MG tablet, Take 1 tablet by mouth every 4 (four) hours as needed for pain., Disp: 30 tablet, Rfl: 0 .  rosuvastatin (CRESTOR) 5 MG tablet, Take 1 tablet (5 mg total) by mouth daily., Disp: 30 tablet, Rfl: 3 .  sildenafil (VIAGRA) 25 MG tablet, 2-3 tabs po x 1. Take 30 minutes to 4 hours prior to sexual activity., Disp: 21 tablet, Rfl: 2 .  venlafaxine XR (EFFEXOR XR) 37.5 MG 24 hr capsule, Take 1 capsule (37.5 mg total) by mouth daily with breakfast., Disp: 30 capsule, Rfl: 3 .  Vitamin D, Ergocalciferol, (DRISDOL) 50000 units CAPS capsule, Take 1 capsule (50,000 Units total) by mouth every 7 (seven) days., Disp: 12 capsule, Rfl: 0 .  warfarin (COUMADIN) 2.5 MG tablet, Take 1 tablet (2.5 mg total) by mouth daily., Disp: 30 tablet, Rfl: 2 .  warfarin (COUMADIN) 4 MG tablet, Take 1 tablet (4 mg total) by mouth daily., Disp: 30 tablet, Rfl: 2 .  warfarin (COUMADIN) 5 MG tablet, Take 1 tablet (5 mg total) by mouth daily., Disp: 30 tablet, Rfl: 0  Allergies  Allergen Reactions  . Cymbalta [Duloxetine Hcl]  Other (See Comments)    Headaches   Review of Systems   Pertinent items are noted in the HPI. Otherwise, ROS is negative.  Vitals:   Vitals:   02/23/17 1128  BP: 132/78  Pulse: 79  Temp: 98.1 F (36.7 C)  TempSrc: Oral  SpO2: 97%  Weight: 181 lb (82.1 kg)  Height: 5\' 9"  (1.753 m)     Body mass index is 26.73 kg/m.   Physical Exam:   Physical Exam  Constitutional: He is oriented to person, place, and time. He appears well-developed and well-nourished. No  distress.  HENT:  Head: Normocephalic and atraumatic.  Right Ear: External ear normal.  Left Ear: External ear normal.  Nose: Nose normal.  Mouth/Throat: Oropharynx is clear and moist.  Eyes: Pupils are equal, round, and reactive to light. Conjunctivae and EOM are normal.  Neck: Normal range of motion. Neck supple.  Cardiovascular: Normal rate, regular rhythm, normal heart sounds and intact distal pulses.   Pulmonary/Chest: Effort normal and breath sounds normal.  Abdominal: Soft. Bowel sounds are normal.  Musculoskeletal: Normal range of motion.  Neurological: He is alert and oriented to person, place, and time.  Skin: Skin is warm and dry.  Psychiatric: He has a normal mood and affect. His behavior is normal. Judgment and thought content normal.  Nursing note and vitals reviewed.  Results for orders placed or performed in visit on 02/23/17  CBC with Differential/Platelet  Result Value Ref Range   WBC 13.6 (H) 4.0 - 10.5 K/uL   RBC 4.80 4.22 - 5.81 Mil/uL   Hemoglobin 10.2 (L) 13.0 - 17.0 g/dL   HCT 34.7 (L) 39.0 - 52.0 %   MCV 72.4 (L) 78.0 - 100.0 fl   MCHC 29.3 Repeated and verified X2. (L) 30.0 - 36.0 g/dL   RDW 21.6 (H) 11.5 - 15.5 %   Platelets 336.0 150.0 - 400.0 K/uL   Neutrophils Relative % 70.9 43.0 - 77.0 %   Lymphocytes Relative 19.5 12.0 - 46.0 %   Monocytes Relative 4.2 3.0 - 12.0 %   Eosinophils Relative 4.5 0.0 - 5.0 %   Basophils Relative 0.9 0.0 - 3.0 %   Neutro Abs 9.6 (H) 1.4 - 7.7 K/uL   Lymphs Abs 2.7 0.7 - 4.0 K/uL   Monocytes Absolute 0.6 0.1 - 1.0 K/uL   Eosinophils Absolute 0.6 0.0 - 0.7 K/uL   Basophils Absolute 0.1 0.0 - 0.1 K/uL  Comprehensive metabolic panel  Result Value Ref Range   Sodium 138 135 - 145 mEq/L   Potassium 3.7 3.5 - 5.1 mEq/L   Chloride 104 96 - 112 mEq/L   CO2 26 19 - 32 mEq/L   Glucose, Bld 113 (H) 70 - 99 mg/dL   BUN 11 6 - 23 mg/dL   Creatinine, Ser 0.79 0.40 - 1.50 mg/dL   Total Bilirubin 0.6 0.2 - 1.2 mg/dL    Alkaline Phosphatase 92 39 - 117 U/L   AST 11 0 - 37 U/L   ALT 15 0 - 53 U/L   Total Protein 7.4 6.0 - 8.3 g/dL   Albumin 4.4 3.5 - 5.2 g/dL   Calcium 9.3 8.4 - 10.5 mg/dL   GFR 110.51 >60.00 mL/min  Vitamin B12  Result Value Ref Range   Vitamin B-12 555 211 - 911 pg/mL  Ferritin  Result Value Ref Range   Ferritin 3.9 (L) 22.0 - 322.0 ng/mL  Iron, TIBC and Ferritin Panel  Result Value Ref Range   Iron 16 (L)  50 - 180 mcg/dL   TIBC 497 (H) 250 - 425 mcg/dL (calc)   %SAT 3 (L) 15 - 60 % (calc)   Ferritin 3 (L) 20 - 380 ng/mL   Assessment and Plan:   [name] was seen today for follow-up.  Diagnoses and all orders for this visit:  Iron deficiency anemia due to chronic blood loss Comments: Recheck today. Worsened.   CBC Latest Ref Rng & Units 02/23/2017 11/17/2016 10/23/2016  WBC 4.0 - 10.5 K/uL 13.6(H) 11.7(H) 10.4(H)  Hemoglobin 13.0 - 17.0 g/dL 10.2(L) 13.8 13.2  Hematocrit 39.0 - 52.0 % 34.7(L) 40.8 40.6  Platelets 150.0 - 400.0 K/uL 336.0 294.0 190   Lab Results  Component Value Date   FERRITIN 3 (L) 02/23/2017   Orders: -     CBC with Differential/Platelet -     Comprehensive metabolic panel -     Vitamin B12 -     Ferritin -     Iron, TIBC and Ferritin Panel  PAD (peripheral artery disease) ([place]) Comments: Visit with Vascular today. Doing well. Orders: -     CBC with Differential/Platelet -     Comprehensive metabolic panel -     Vitamin B12 -     Ferritin -     Cancel: Iron Binding Cap (TIBC) -     Iron, TIBC and Ferritin Panel  Chronic right-sided low back pain with right-sided sciatica Comments: Worsening. I ecouraged him to obtain lumbar epidural soon - will see if helpful for diagnosis as well as treatment. Concern for CRPS.  Orders: -     pregabalin (LYRICA) 200 MG capsule; Take 1 capsule (200 mg total) by mouth 2 (two) times daily. -     oxyCODONE-acetaminophen (PERCOCET) 10-325 MG tablet; Take 1 tablet by mouth every 4 (four) hours as needed for  pain. -     oxyCODONE-acetaminophen (PERCOCET) 10-325 MG tablet; Take 1 tablet by mouth every 4 (four) hours as needed for pain. -     oxyCODONE-acetaminophen (PERCOCET) 10-325 MG tablet; Take 1 tablet by mouth every 4 (four) hours as needed for pain.  Erectile dysfunction, unspecified erectile dysfunction type Comments: Helping moderately. See previous mychart message. Refill of Viagra today. Orders: -     sildenafil (VIAGRA) 25 MG tablet; 2-3 tabs po x 1. Take 30 minutes to 4 hours prior to sexual activity.  Need for Tdap vaccination -     Tdap vaccine greater than or equal to 7yo IM  Need for immunization against influenza -     Flu Vaccine QUAD 36+ mos IM  Nicotine abuse Comments: Active stage. Rx Chantix. Recommend Dr. Elvin So book, see AVS.  Orders: -     varenicline (CHANTIX STARTING MONTH PAK) 0.5 MG X 11 & 1 MG X 42 tablet; Take one 0.5 mg tablet by mouth once daily for 3 days, then increase to one 0.5 mg tablet twice daily for 4 days, then increase to one 1 mg tablet twice daily.  Cough Comments: In smoker. See Rx below. Abx provided as safety net. Precations reviewed.  Orders: -     doxycycline (VIBRA-TABS) 100 MG tablet; Take 1 tablet (100 mg total) by mouth 2 (two) times daily. -     albuterol (PROVENTIL HFA;VENTOLIN HFA) 108 (90 Base) MCG/ACT inhaler; Inhale 2 puffs into the lungs every 6 (six) hours as needed for wheezing or shortness of breath.  . Reviewed expectations re: course of current medical issues. . Discussed self-management of symptoms. . Outlined signs and symptoms  indicating need for more acute intervention. . Patient verbalized understanding and all questions were answered. Marland Kitchen Health Maintenance issues including appropriate healthy diet, exercise, and smoking avoidance were discussed with patient. . See orders for this visit as documented in the electronic medical record. . Patient received an After Visit Summary.  CMA served as Education administrator during this  visit. History, Physical, and Plan performed by medical provider. The above documentation has been reviewed and is accurate and complete. Briscoe Deutscher, D.O.  Briscoe Deutscher, DO [place] [place] 02/24/2017  Future Appointments Date Time Provider [place]  03/09/2017 3:30 PM [facility] None

## 2017-02-23 NOTE — Progress Notes (Signed)
I have reviewed this visit and I agree on the patient's plan of dosage and recommendations. Marylee Belzer, DO   

## 2017-02-23 NOTE — Addendum Note (Signed)
Addended by: Lianne Cure A on: 02/23/2017 10:51 AM   Modules accepted: Orders

## 2017-02-24 LAB — IRON,TIBC AND FERRITIN PANEL
%SAT: 3 % (calc) — ABNORMAL LOW (ref 15–60)
Ferritin: 3 ng/mL — ABNORMAL LOW (ref 20–380)
Iron: 16 ug/dL — ABNORMAL LOW (ref 50–180)
TIBC: 497 mcg/dL (calc) — ABNORMAL HIGH (ref 250–425)

## 2017-02-26 MED FILL — VENLAFAXINE HCL ER 37.5 MG: 37.5 | 30 days supply | Qty: 30 | Fill #3

## 2017-02-26 MED FILL — WARFARIN NA 2.5 MG TAB: 2.5 | 30 days supply | Qty: 30 | Fill #1

## 2017-02-26 MED FILL — WARFARIN SODIUM 4 MG TABLET: 4 | 30 days supply | Qty: 30 | Fill #1

## 2017-02-27 ENCOUNTER — Other Ambulatory Visit: Payer: Self-pay | Admitting: *Deleted

## 2017-02-27 DIAGNOSIS — D500 Iron deficiency anemia secondary to blood loss (chronic): Secondary | ICD-10-CM

## 2017-02-28 ENCOUNTER — Other Ambulatory Visit (INDEPENDENT_AMBULATORY_CARE_PROVIDER_SITE_OTHER): Payer: 59

## 2017-02-28 ENCOUNTER — Ambulatory Visit (INDEPENDENT_AMBULATORY_CARE_PROVIDER_SITE_OTHER)
Admission: RE | Admit: 2017-02-28 | Discharge: 2017-02-28 | Disposition: A | Discharge status: Home / Self Care | Payer: 59 | Source: Ambulatory Visit | Attending: Family Medicine | Admitting: Family Medicine

## 2017-02-28 DIAGNOSIS — D500 Iron deficiency anemia secondary to blood loss (chronic): Secondary | ICD-10-CM | POA: Diagnosis not present

## 2017-02-28 DIAGNOSIS — R05 Cough: Secondary | ICD-10-CM | POA: Diagnosis not present

## 2017-02-28 LAB — CBC WITH DIFFERENTIAL/PLATELET
BASOS ABS: 0.1 10*3/uL (ref 0.0–0.1)
BASOS PCT: 0.7 % (ref 0.0–3.0)
BASOS PCT: 0.8 %
Basophils Absolute: 124 cells/uL (ref 0–200)
EOS ABS: 0.7 10*3/uL (ref 0.0–0.7)
EOS ABS: 698 {cells}/uL — AB (ref 15–500)
Eosinophils Relative: 4.3 % (ref 0.0–5.0)
Eosinophils Relative: 4.5 %
HCT: 36.3 % — ABNORMAL LOW (ref 39.0–52.0)
HEMATOCRIT: 35.7 % — AB (ref 38.5–50.0)
HEMOGLOBIN: 10.6 g/dL — AB (ref 13.2–17.1)
Hemoglobin: 10.9 g/dL — ABNORMAL LOW (ref 13.0–17.0)
LYMPHS ABS: 2.4 10*3/uL (ref 0.7–4.0)
LYMPHS ABS: 2496 {cells}/uL (ref 850–3900)
LYMPHS PCT: 15.4 % (ref 12.0–46.0)
MCH: 21.1 pg — AB (ref 27.0–33.0)
MCHC: 30.1 g/dL (ref 30.0–36.0)
MCHC: 29.7 g/dL — ABNORMAL LOW (ref 32.0–36.0)
MCV: 71.3 fl — ABNORMAL LOW (ref 78.0–100.0)
MCV: 71.1 fL — AB (ref 80.0–100.0)
MONO ABS: 0.5 10*3/uL (ref 0.1–1.0)
MPV: 9.9 fL (ref 7.5–12.5)
Monocytes Relative: 3.4 % (ref 3.0–12.0)
Monocytes Relative: 3.7 %
NEUTROS ABS: 11.8 10*3/uL — AB (ref 1.4–7.7)
NEUTROS PCT: 76.2 % (ref 43.0–77.0)
Neutro Abs: 11610 cells/uL — ABNORMAL HIGH (ref 1500–7800)
Neutrophils Relative %: 74.9 %
PLATELETS: 349 10*3/uL (ref 150.0–400.0)
Platelets: 357 10*3/uL (ref 140–400)
RBC: 5.09 Mil/uL (ref 4.22–5.81)
RBC: 5.02 10*6/uL (ref 4.20–5.80)
RDW: 21.3 % — AB (ref 11.5–15.5)
RDW: 18.1 % — ABNORMAL HIGH (ref 11.0–15.0)
Total Lymphocyte: 16.1 %
WBC: 15.5 10*3/uL — ABNORMAL HIGH (ref 4.0–10.5)
WBC: 15.5 10*3/uL — AB (ref 3.8–10.8)
WBCMIX: 574 {cells}/uL (ref 200–950)

## 2017-02-28 LAB — TESTOSTERONE: Testosterone: 235.04 ng/dL — ABNORMAL LOW (ref 300.00–890.00)

## 2017-02-28 MED FILL — SILDENAFIL CITRATE 25 MG TA: 25 | 7 days supply | Qty: 21 | Fill #0

## 2017-02-28 NOTE — Addendum Note (Signed)
Addended by: Douglass Rivers T on: 02/28/2017 08:32 AM   Modules accepted: Orders

## 2017-03-01 ENCOUNTER — Other Ambulatory Visit: Payer: Self-pay | Admitting: *Deleted

## 2017-03-01 ENCOUNTER — Other Ambulatory Visit (INDEPENDENT_AMBULATORY_CARE_PROVIDER_SITE_OTHER): Payer: 59

## 2017-03-01 DIAGNOSIS — D500 Iron deficiency anemia secondary to blood loss (chronic): Secondary | ICD-10-CM

## 2017-03-01 LAB — AMYLASE: Amylase: 40 U/L (ref 27–131)

## 2017-03-01 LAB — LIPASE: LIPASE: 18 U/L (ref 11.0–59.0)

## 2017-03-01 LAB — GAMMA GT: GGT: 22 U/L (ref 7–51)

## 2017-03-02 LAB — CELIAC AB TTG DGP TIGA: IGA/IMMUNOGLOBULIN A, SERUM: 208 mg/dL (ref 90–386)

## 2017-03-02 LAB — HEPB+HEPC+HIV PANEL
HEP B C IGM: NEGATIVE
HEP B C TOTAL AB: NEGATIVE
HEP B E AB: NEGATIVE
HEP B SURFACE AB, QUAL: NONREACTIVE
HIV Screen 4th Generation wRfx: NONREACTIVE
Hep B E Ag: NEGATIVE
Hep C Virus Ab: <0.1 s/co ratio (ref 0.0–0.9)
Hepatitis B Surface Ag: NEGATIVE

## 2017-03-02 LAB — GLIA (IGA/G) + TTG IGA
Antigliadin Abs, IgA: 7 units (ref 0–19)
Gliadin IgG: 5 units (ref 0–19)
Transglutaminase IgA: <2 U/mL (ref 0–3)

## 2017-03-05 LAB — LEAD, BLOOD (ADULT >= 16 YRS): Lead: 1 ug/dL (ref <5)

## 2017-03-05 LAB — TIQ-NTM

## 2017-03-05 LAB — HEMOGLOBINOPATHY EVALUATION
Fetal Hemoglobin Testing: <1 % (ref 0.0–1.9)
HEMATOCRIT: 39.5 % (ref 38.5–50.0)
HEMOGLOBIN A2 - HGBRFX: 1.6 % — AB (ref 1.8–3.5)
HEMOGLOBIN: 10.8 g/dL — AB (ref 13.2–17.1)
Hgb A: 97.4 % (ref >96.0)
MCH: 21.1 pg — ABNORMAL LOW (ref 27.0–33.0)
MCV: 77.1 fL — ABNORMAL LOW (ref 80.0–100.0)
RBC: 5.12 10*6/uL (ref 4.20–5.80)
RDW: 18.8 % — AB (ref 11.0–15.0)

## 2017-03-05 LAB — LACTATE DEHYDROGENASE: LDH: 265 U/L — ABNORMAL HIGH (ref 100–220)

## 2017-03-05 LAB — PATHOLOGIST SMEAR REVIEW

## 2017-03-05 LAB — HAPTOGLOBIN: HAPTOGLOBIN: 241 mg/dL — AB (ref 43–212)

## 2017-03-05 LAB — ZINC

## 2017-03-05 LAB — CERULOPLASMIN: CERULOPLASMIN: 34 mg/dL (ref 18–36)

## 2017-03-05 LAB — RETICULOCYTES
ABS RETIC: 55770 {cells}/uL (ref 25000–9000)
RETIC CT PCT: 1.1 %

## 2017-03-06 ENCOUNTER — Encounter: Payer: Self-pay | Admitting: Family Medicine

## 2017-03-06 ENCOUNTER — Other Ambulatory Visit: Payer: Self-pay | Admitting: Family Medicine

## 2017-03-07 ENCOUNTER — Other Ambulatory Visit: Payer: Self-pay | Admitting: *Deleted

## 2017-03-07 ENCOUNTER — Encounter: Payer: Self-pay | Admitting: *Deleted

## 2017-03-07 DIAGNOSIS — D500 Iron deficiency anemia secondary to blood loss (chronic): Secondary | ICD-10-CM

## 2017-03-09 ENCOUNTER — Encounter (HOSPITAL_COMMUNITY): Payer: 59

## 2017-03-09 ENCOUNTER — Ambulatory Visit (INDEPENDENT_AMBULATORY_CARE_PROVIDER_SITE_OTHER): Payer: 59 | Admitting: *Deleted

## 2017-03-09 DIAGNOSIS — Z5181 Encounter for therapeutic drug level monitoring: Secondary | ICD-10-CM

## 2017-03-09 DIAGNOSIS — D6859 Other primary thrombophilia: Secondary | ICD-10-CM | POA: Diagnosis not present

## 2017-03-09 DIAGNOSIS — Z7901 Long term (current) use of anticoagulants: Secondary | ICD-10-CM

## 2017-03-09 LAB — POCT INR: INR: 2.5

## 2017-03-12 NOTE — Progress Notes (Signed)
I have reviewed this visit and I agree on the patient's plan of dosage and recommendations. Maliya Marich, DO   

## 2017-03-13 ENCOUNTER — Encounter: Payer: Self-pay | Admitting: Family Medicine

## 2017-03-16 ENCOUNTER — Ambulatory Visit (HOSPITAL_COMMUNITY)
Admission: RE | Admit: 2017-03-16 | Discharge: 2017-03-16 | Disposition: A | Discharge status: Home / Self Care | Payer: 59 | Source: Ambulatory Visit | Attending: Family Medicine | Admitting: Family Medicine

## 2017-03-16 DIAGNOSIS — D500 Iron deficiency anemia secondary to blood loss (chronic): Secondary | ICD-10-CM | POA: Insufficient documentation

## 2017-03-16 MED ORDER — SODIUM CHLORIDE 0.9 % IV SOLN
750.0000 mg | Freq: Once | INTRAVENOUS | Status: AC
  Administered 2017-03-16: 750 mg via INTRAVENOUS
  Filled 2017-03-16: qty 15

## 2017-03-16 NOTE — Progress Notes (Signed)
Provider: Juleen China, E  Diagnosis Association: Iron deficiency anemia due to chronic blood loss (D50.0)  Treatment: ferric carboxymaltose (INJECTAFER) 750 mg in sodium chloride 0.9 % 250 mL IVPB.  Patient tolerated procedure well with no reaction. Vital signs remained stable. Patient observed for 30 minutes post transfusion. Discharge instructions given to patient and patient states an understanding. Patient alert, oriented, and ambulatory at time of discharge.

## 2017-03-16 NOTE — Discharge Instructions (Signed)
Ferric carboxymaltose injection What is this medicine? FERRIC CARBOXYMALTOSE (ferr-ik car-box-ee-mol-toes) is an iron complex. Iron is used to make healthy red blood cells, which carry oxygen and nutrients throughout the body. This medicine is used to treat anemia in people with chronic kidney disease or people who cannot take iron by mouth. This medicine may be used for other purposes; ask your health care provider or pharmacist if you have questions. COMMON BRAND NAME(S): Injectafer What should I tell my health care provider before I take this medicine? They need to know if you have any of these conditions: -anemia not caused by low iron levels -high levels of iron in the blood -liver disease -an unusual or allergic reaction to iron, other medicines, foods, dyes, or preservatives -pregnant or trying to get pregnant -breast-feeding How should I use this medicine? This medicine is for infusion into a vein. It is given by a health care professional in a hospital or clinic setting. Talk to your pediatrician regarding the use of this medicine in children. Special care may be needed. Overdosage: If you think you have taken too much of this medicine contact a poison control center or emergency room at once. NOTE: This medicine is only for you. Do not share this medicine with others. What if I miss a dose? It is important not to miss your dose. Call your doctor or health care professional if you are unable to keep an appointment. What may interact with this medicine? Do not take this medicine with any of the following medications: -deferoxamine -dimercaprol -other iron products This medicine may also interact with the following medications: -chloramphenicol -deferasirox This list may not describe all possible interactions. Give your health care provider a list of all the medicines, herbs, non-prescription drugs, or dietary supplements you use. Also tell them if you smoke, drink alcohol, or use  illegal drugs. Some items may interact with your medicine. What should I watch for while using this medicine? Visit your doctor or health care professional regularly. Tell your doctor if your symptoms do not start to get better or if they get worse. You may need blood work done while you are taking this medicine. You may need to follow a special diet. Talk to your doctor. Foods that contain iron include: whole grains/cereals, dried fruits, beans, or peas, leafy green vegetables, and organ meats (liver, kidney). What side effects may I notice from receiving this medicine? Side effects that you should report to your doctor or health care professional as soon as possible: -allergic reactions like skin rash, itching or hives, swelling of the face, lips, or tongue -breathing problems -changes in blood pressure -feeling faint or lightheaded, falls -flushing, sweating, or hot feelings Side effects that usually do not require medical attention (report to your doctor or health care professional if they continue or are bothersome): -changes in taste -constipation -dizziness -headache -nausea -pain, redness, or irritation at site where injected -vomiting This list may not describe all possible side effects. Call your doctor for medical advice about side effects. You may report side effects to FDA at 1-800-FDA-1088. Where should I keep my medicine? This drug is given in a hospital or clinic and will not be stored at home. NOTE: This sheet is a summary. It may not cover all possible information. If you have questions about this medicine, talk to your doctor, pharmacist, or health care provider.  2018 Elsevier/Gold Standard (2015-06-10 11:20:47)  

## 2017-03-19 MED FILL — ROSUVASTATIN CALCIUM 5 MG T: 5 | 30 days supply | Qty: 30 | Fill #1

## 2017-03-23 ENCOUNTER — Other Ambulatory Visit: Payer: Self-pay | Admitting: General Practice

## 2017-03-23 ENCOUNTER — Ambulatory Visit (INDEPENDENT_AMBULATORY_CARE_PROVIDER_SITE_OTHER): Payer: 59 | Admitting: General Practice

## 2017-03-23 ENCOUNTER — Telehealth: Payer: Self-pay | Admitting: General Practice

## 2017-03-23 ENCOUNTER — Ambulatory Visit: Payer: 59

## 2017-03-23 DIAGNOSIS — I4891 Unspecified atrial fibrillation: Secondary | ICD-10-CM | POA: Diagnosis not present

## 2017-03-23 DIAGNOSIS — Z7901 Long term (current) use of anticoagulants: Secondary | ICD-10-CM

## 2017-03-23 DIAGNOSIS — D6859 Other primary thrombophilia: Secondary | ICD-10-CM

## 2017-03-23 LAB — POCT INR: INR: 1.9

## 2017-03-23 MED ORDER — ENOXAPARIN SODIUM 120 MG/0.8ML ~~LOC~~ SOLN: 120.0000 mg | SUBCUTANEOUS | 0 refills | Status: DC

## 2017-03-23 MED FILL — ENOXAPARIN 120 MG/0.8 ML SY: 120 | 10 days supply | Qty: 8 | Fill #0

## 2017-03-23 NOTE — Telephone Encounter (Signed)
Yes. Okay. Thanks! EW

## 2017-03-23 NOTE — Patient Instructions (Addendum)
Pre visit review using our clinic review tool, if applicable. No additional management support is needed unless otherwise documented below in the visit note.   11/9 - Last dose of coumadin until after procedure 11/10 - Nothing 11/11 - Lovenox in the AM 11/12 - Lovenox in the AM 11/13 - Lovenox in the AM (Take by 7 am)(Need at least 24 hours between injection and procedure) 11/14 - Procedure (NO LOVENOX) 11/15 - Lovenox and 12 mg of coumadin  11/16 - Lovenox and 12 mg of coumadin 11/17 - Lovenox and 10 mg of coumadin 11/18 - Lovenox and 10 mg of coumadin 11/19 - Check INR on 11/19 @ [place] @ 4:30

## 2017-03-23 NOTE — Telephone Encounter (Signed)
Dr. Juleen China,  I am seeing patient today for INR.  He has a colonoscopy scheduled in the next few days and will need to stop coumadin for 5 days and will need a Lovenox bridge.  Are you OK with me dosing and calling in the Lovenox?  Please let me know.  Thanks, Villa Herb, RN

## 2017-03-25 ENCOUNTER — Encounter: Payer: Self-pay | Admitting: Family Medicine

## 2017-03-26 ENCOUNTER — Other Ambulatory Visit: Payer: Self-pay | Admitting: General Practice

## 2017-03-26 ENCOUNTER — Other Ambulatory Visit: Payer: Self-pay | Admitting: Surgical

## 2017-03-26 DIAGNOSIS — G8929 Other chronic pain: Secondary | ICD-10-CM

## 2017-03-26 DIAGNOSIS — M5441 Lumbago with sciatica, right side: Principal | ICD-10-CM

## 2017-03-26 MED ORDER — WARFARIN SODIUM 1 MG PO TABS: 1.0000 mg | ORAL_TABLET | Freq: Every day | ORAL | 0 refills | Status: DC

## 2017-03-26 MED ORDER — PREGABALIN 200 MG PO CAPS: 200.0000 mg | ORAL_CAPSULE | Freq: Two times a day (BID) | ORAL | 1 refills | Status: DC

## 2017-03-26 MED ORDER — WARFARIN SODIUM 6 MG PO TABS: 6.0000 mg | ORAL_TABLET | Freq: Every day | ORAL | 3 refills | Status: DC

## 2017-03-26 MED FILL — WARFARIN SODIUM 1 MG TABLET: 1 | 30 days supply | Qty: 30 | Fill #0

## 2017-03-26 MED FILL — WARFARIN SODIUM 6 MG TABLET: 6 | 30 days supply | Qty: 30 | Fill #0

## 2017-03-26 MED FILL — LYRICA 200 MG CAPSULE: 200 | 30 days supply | Qty: 60 | Fill #0

## 2017-03-26 MED FILL — OXYCODONE-APAP 10-325 MG TA: 10-325 | 5 days supply | Qty: 30 | Fill #0

## 2017-04-01 ENCOUNTER — Encounter: Payer: Self-pay | Admitting: Family Medicine

## 2017-04-02 ENCOUNTER — Other Ambulatory Visit: Payer: Self-pay | Admitting: Surgical

## 2017-04-02 MED ORDER — VENLAFAXINE HCL ER 37.5 MG PO CP24: 37.5000 mg | ORAL_CAPSULE | Freq: Every day | ORAL | 1 refills | Status: DC

## 2017-04-02 MED FILL — VENLAFAXINE HCL ER 37.5 MG: 37.5 | 90 days supply | Qty: 90 | Fill #0

## 2017-04-03 ENCOUNTER — Ambulatory Visit (INDEPENDENT_AMBULATORY_CARE_PROVIDER_SITE_OTHER): Payer: 59 | Admitting: General Practice

## 2017-04-03 DIAGNOSIS — D6859 Other primary thrombophilia: Secondary | ICD-10-CM | POA: Diagnosis not present

## 2017-04-03 DIAGNOSIS — Z7901 Long term (current) use of anticoagulants: Secondary | ICD-10-CM | POA: Diagnosis not present

## 2017-04-03 LAB — POCT INR: INR: 1.5

## 2017-04-03 NOTE — Patient Instructions (Addendum)
Pre visit review using our clinic review tool, if applicable. No additional management support is needed unless otherwise documented below in the visit note.  Follow prior instructions for coumadin and lovenox pre and post procedure on 11/14.

## 2017-04-04 ENCOUNTER — Other Ambulatory Visit: Payer: Self-pay | Admitting: Family Medicine

## 2017-04-04 ENCOUNTER — Ambulatory Visit
Admission: RE | Admit: 2017-04-04 | Discharge: 2017-04-04 | Disposition: A | Discharge status: Home / Self Care | Payer: 59 | Source: Ambulatory Visit | Attending: Family Medicine | Admitting: Family Medicine

## 2017-04-04 DIAGNOSIS — M5441 Lumbago with sciatica, right side: Principal | ICD-10-CM

## 2017-04-04 DIAGNOSIS — G8929 Other chronic pain: Secondary | ICD-10-CM

## 2017-04-04 DIAGNOSIS — M47817 Spondylosis without myelopathy or radiculopathy, lumbosacral region: Secondary | ICD-10-CM | POA: Diagnosis not present

## 2017-04-04 MED ORDER — IOPAMIDOL (ISOVUE-M 200) INJECTION 41%
1.0000 mL | Freq: Once | INTRAMUSCULAR | Status: AC
  Administered 2017-04-04: 1 mL via EPIDURAL

## 2017-04-04 MED ORDER — METHYLPREDNISOLONE ACETATE 40 MG/ML INJ SUSP (RADIOLOG
120.0000 mg | Freq: Once | INTRAMUSCULAR | Status: AC
  Administered 2017-04-04: 120 mg via EPIDURAL

## 2017-04-04 NOTE — Discharge Instructions (Signed)

## 2017-04-09 ENCOUNTER — Encounter: Payer: Self-pay | Admitting: Family Medicine

## 2017-04-09 ENCOUNTER — Ambulatory Visit (INDEPENDENT_AMBULATORY_CARE_PROVIDER_SITE_OTHER): Payer: 59 | Admitting: General Practice

## 2017-04-09 DIAGNOSIS — Z7901 Long term (current) use of anticoagulants: Secondary | ICD-10-CM | POA: Diagnosis not present

## 2017-04-09 DIAGNOSIS — Z0271 Encounter for disability determination: Secondary | ICD-10-CM

## 2017-04-09 DIAGNOSIS — D6859 Other primary thrombophilia: Secondary | ICD-10-CM

## 2017-04-09 LAB — POCT INR: INR: 2

## 2017-04-09 MED FILL — SILDENAFIL CITRATE 25 MG TA: 25 | 7 days supply | Qty: 21 | Fill #1

## 2017-04-09 NOTE — Progress Notes (Signed)
I agree with this plan.

## 2017-04-09 NOTE — Patient Instructions (Addendum)
Pre visit review using our clinic review tool, if applicable. No additional management support is needed unless otherwise documented below in the visit note.  Take 9 mg today and tomorrow (11/19 and 11/20) and then continue to take 6 mg daily except 7 mg on Tuesdays.  Re-check in 3 weeks at [facility].

## 2017-04-18 MED FILL — ROSUVASTATIN CALCIUM 5 MG T: 5 | 30 days supply | Qty: 30 | Fill #2

## 2017-04-25 MED FILL — OXYCODONE-APAP 10-325 MG TA: 10-325 | 5 days supply | Qty: 30 | Fill #0

## 2017-04-30 ENCOUNTER — Ambulatory Visit: Payer: 59

## 2017-04-30 ENCOUNTER — Ambulatory Visit: Payer: 59 | Admitting: Family Medicine

## 2017-05-01 MED FILL — LYRICA 200 MG CAPSULE: 200 | 30 days supply | Qty: 60 | Fill #1

## 2017-05-02 MED FILL — WARFARIN SODIUM 6 MG TABLET: 6 | 30 days supply | Qty: 30 | Fill #1

## 2017-05-04 ENCOUNTER — Encounter: Payer: Self-pay | Admitting: Family Medicine

## 2017-05-04 ENCOUNTER — Telehealth: Payer: Self-pay | Admitting: General Practice

## 2017-05-04 ENCOUNTER — Telehealth: Payer: Self-pay | Admitting: Gastroenterology

## 2017-05-04 ENCOUNTER — Ambulatory Visit (INDEPENDENT_AMBULATORY_CARE_PROVIDER_SITE_OTHER): Payer: 59 | Admitting: Family Medicine

## 2017-05-04 ENCOUNTER — Other Ambulatory Visit: Payer: Self-pay | Admitting: General Practice

## 2017-05-04 VITALS — BP 110/84 | HR 82 | Temp 98.0°F | Ht 69.0 in | Wt 177.8 lb

## 2017-05-04 DIAGNOSIS — Z716 Tobacco abuse counseling: Secondary | ICD-10-CM | POA: Diagnosis not present

## 2017-05-04 DIAGNOSIS — E559 Vitamin D deficiency, unspecified: Secondary | ICD-10-CM | POA: Diagnosis not present

## 2017-05-04 DIAGNOSIS — I739 Peripheral vascular disease, unspecified: Secondary | ICD-10-CM

## 2017-05-04 DIAGNOSIS — M5441 Lumbago with sciatica, right side: Secondary | ICD-10-CM | POA: Diagnosis not present

## 2017-05-04 DIAGNOSIS — Z5181 Encounter for therapeutic drug level monitoring: Secondary | ICD-10-CM

## 2017-05-04 DIAGNOSIS — R7989 Other specified abnormal findings of blood chemistry: Secondary | ICD-10-CM

## 2017-05-04 DIAGNOSIS — Z7901 Long term (current) use of anticoagulants: Secondary | ICD-10-CM

## 2017-05-04 DIAGNOSIS — G4730 Sleep apnea, unspecified: Secondary | ICD-10-CM | POA: Diagnosis not present

## 2017-05-04 DIAGNOSIS — M542 Cervicalgia: Secondary | ICD-10-CM

## 2017-05-04 DIAGNOSIS — M79609 Pain in unspecified limb: Secondary | ICD-10-CM

## 2017-05-04 DIAGNOSIS — E785 Hyperlipidemia, unspecified: Secondary | ICD-10-CM

## 2017-05-04 DIAGNOSIS — Z720 Tobacco use: Secondary | ICD-10-CM

## 2017-05-04 DIAGNOSIS — G8929 Other chronic pain: Secondary | ICD-10-CM

## 2017-05-04 DIAGNOSIS — D500 Iron deficiency anemia secondary to blood loss (chronic): Secondary | ICD-10-CM

## 2017-05-04 DIAGNOSIS — R202 Paresthesia of skin: Secondary | ICD-10-CM

## 2017-05-04 HISTORY — DX: Other specified abnormal findings of blood chemistry: R79.89

## 2017-05-04 LAB — COMPREHENSIVE METABOLIC PANEL
ALT: 16 U/L (ref 0–53)
AST: 17 U/L (ref 0–37)
Albumin: 4.1 g/dL (ref 3.5–5.2)
Alkaline Phosphatase: 84 U/L (ref 39–117)
BUN: 7 mg/dL (ref 6–23)
CO2: 25 mEq/L (ref 19–32)
Calcium: 8.9 mg/dL (ref 8.4–10.5)
Chloride: 107 mEq/L (ref 96–112)
Creatinine, Ser: 0.78 mg/dL (ref 0.40–1.50)
GFR: 112.06 mL/min (ref >60.00)
Glucose, Bld: 88 mg/dL (ref 70–99)
Potassium: 4.4 mEq/L (ref 3.5–5.1)
Sodium: 140 mEq/L (ref 135–145)
Total Bilirubin: 0.5 mg/dL (ref 0.2–1.2)
Total Protein: 6.8 g/dL (ref 6.0–8.3)

## 2017-05-04 LAB — CBC WITH DIFFERENTIAL/PLATELET
Basophils Absolute: 0.1 10*3/uL (ref 0.0–0.1)
Basophils Relative: 0.8 % (ref 0.0–3.0)
Eosinophils Absolute: 0.5 10*3/uL (ref 0.0–0.7)
Eosinophils Relative: 3.8 % (ref 0.0–5.0)
HCT: 46.6 % (ref 39.0–52.0)
Hemoglobin: 14.8 g/dL (ref 13.0–17.0)
Lymphocytes Relative: 16.6 % (ref 12.0–46.0)
Lymphs Abs: 2.1 10*3/uL (ref 0.7–4.0)
MCHC: 31.8 g/dL (ref 30.0–36.0)
MCV: 85.8 fl (ref 78.0–100.0)
Monocytes Absolute: 0.6 10*3/uL (ref 0.1–1.0)
Monocytes Relative: 4.6 % (ref 3.0–12.0)
Neutro Abs: 9.3 10*3/uL — ABNORMAL HIGH (ref 1.4–7.7)
Neutrophils Relative %: 74.2 % (ref 43.0–77.0)
Platelets: 193 10*3/uL (ref 150.0–400.0)
RBC: 5.43 Mil/uL (ref 4.22–5.81)
RDW: 30.7 % — ABNORMAL HIGH (ref 11.5–15.5)
WBC: 12.5 10*3/uL — ABNORMAL HIGH (ref 4.0–10.5)

## 2017-05-04 LAB — LIPID PANEL
Cholesterol: 118 mg/dL (ref 0–200)
HDL: 29.4 mg/dL — ABNORMAL LOW (ref >39.00)
LDL Cholesterol: 64 mg/dL (ref 0–99)
NonHDL: 88.54
Total CHOL/HDL Ratio: 4
Triglycerides: 122 mg/dL (ref 0.0–149.0)
VLDL: 24.4 mg/dL (ref 0.0–40.0)

## 2017-05-04 LAB — VITAMIN D 25 HYDROXY (VIT D DEFICIENCY, FRACTURES): VITD: 34.75 ng/mL (ref 30.00–100.00)

## 2017-05-04 LAB — PSA: PSA: 0.32 ng/mL (ref 0.10–4.00)

## 2017-05-04 LAB — POCT INR: INR: 2.4

## 2017-05-04 MED ORDER — PREGABALIN 300 MG PO CAPS: 300.0000 mg | ORAL_CAPSULE | Freq: Two times a day (BID) | ORAL | 3 refills | Status: DC

## 2017-05-04 MED ORDER — ENOXAPARIN SODIUM 120 MG/0.8ML ~~LOC~~ SOLN: 120.0000 mg | SUBCUTANEOUS | 0 refills | Status: DC

## 2017-05-04 MED ORDER — CYANOCOBALAMIN 1000 MCG/ML IJ SOLN
1000.0000 ug | Freq: Once | INTRAMUSCULAR | Status: AC
  Administered 2017-05-04: 1000 ug via INTRAMUSCULAR

## 2017-05-04 MED ORDER — HYDROCODONE-ACETAMINOPHEN 10-325 MG PO TABS: 1.0000 | ORAL_TABLET | Freq: Three times a day (TID) | ORAL | 0 refills | Status: DC | PRN

## 2017-05-04 MED FILL — ENOXAPARIN 120 MG/0.8 ML SY: 120 | 4 days supply | Qty: 3 | Fill #0

## 2017-05-04 NOTE — Telephone Encounter (Signed)
Please advise 

## 2017-05-04 NOTE — Telephone Encounter (Signed)
12/22 - Last dose of coumadin 12/23 - Nothing 12/24 - Lovenox in the AM 12/25 - Lovenox in the AM 12/26 - Lovenox in the AM (Take Lovenox by 7 am) 12/27 - Procedure (No Lovenox today) 12/28 - Lovenox and 2 tablets of coumadin (12 mg) 12/29 - Lovenox and 2 tablets of coumadin (12 mg) 12/30 - Lovenox and 1 1/2 tablets of coumadin (9 mg) 12/31 - Lovenox and 1 1/2 tablets of coumadin (9 mg) 1/1 - Stop Lovenox and take 7 mg of coumadin   1/2 - Check INR at [facility] @ 8:00

## 2017-05-04 NOTE — Telephone Encounter (Signed)
-----   Message from Briscoe Deutscher, DO sent at 05/04/2017  2:16 PM EST ----- Regarding: RE: Lovenox bridge Yes, please expedite. He did very well with the last bridge. EW ----- Message ----- From: Warden Fillers, RN Sent: 05/04/2017   1:37 PM To: Briscoe Deutscher, DO Subject: Lovenox bridge                                 Hi Dr. Juleen China,  Patient is having an epidural on 12/27.  Do I have your permission to dose and call in Lovenox?  Please advise.  Villa Herb, RN

## 2017-05-04 NOTE — Progress Notes (Addendum)
[name] is a 51 y.o. male is here for follow up.  History of Present Illness:   HPI: See Assessment and Plan section for Problem Based Charting of issues discussed today.  There are no preventive care reminders to display for this patient. Depression screen [facility] 2/9 07/08/2016 07/05/2016 03/08/2015  Decreased Interest 0 1 0  Down, Depressed, Hopeless 0 0 0  PHQ - 2 Score 0 1 0   PMHx, SurgHx, SocialHx, FamHx, Medications, and Allergies were reviewed in the Visit Navigator and updated as appropriate.   Patient Active Problem List   Diagnosis Date Noted  . Low testosterone in male 05/04/2017  . Erectile dysfunction 11/19/2016  . Chronic right-sided low back pain with right-sided sciatica 11/19/2016  . Internal hemorrhoid 11/19/2016  . 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 Arthritis of left acromioclavicular joint 09/29/2016  . Monitoring for anticoagulant use 09/15/2016  . Long term (current) use of anticoagulants [Z79.01] 09/15/2016  . Primary hypercoagulable state ([place]) [D68.59] 09/15/2016  . History of alcohol abuse 07/11/2016  . Chronic left shoulder pain 07/08/2016  . Iron deficiency anemia due to chronic blood loss   . Gastroesophageal reflux disease   . Dyslipidemia 06/27/2016  . Antiphospholipid antibody positive 06/27/2016  . Shortness of breath 06/02/2016  . Thrombus 06/02/2016  . PAD (peripheral artery disease) ([place]) 06/02/2016  . Claudication ([place]) 06/02/2016  . 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 Atherosclerosis of native arteries of extremity with intermittent claudication ([place]) 02/24/2016  . Weakness of right leg 01/11/2016  . Piriformis syndrome of right side 12/29/2015  . Leg length discrepancy 12/29/2015  . Anxiety with depression 06/24/2014  . Nicotine abuse 06/24/2014   Social History   Tobacco Use  . Smoking status: Current Every Day Smoker    Packs/day: 1.00    Years: 33.00    Pack years: 33.00    Types: Cigarettes, E-cigarettes  .  Smokeless tobacco: Never Used  Substance Use Topics  . Alcohol use: No    Comment: per pt recovering alcoholic in remission since 04-25-2016  . Drug use: No   Current Medications and Allergies:   .  albuterol (PROVENTIL HFA;VENTOLIN HFA) 108 (90 Base) MCG/ACT inhaler, Inhale 2 puffs into the lungs every 6 (six) hours as needed for wheezing or shortness of breath., Disp: 1 Inhaler, Rfl: 2 .  doxycycline (VIBRA-TABS) 100 MG tablet, Take 1 tablet (100 mg total) by mouth 2 (two) times daily., Disp: 20 tablet, Rfl: 0 .  enoxaparin (LOVENOX) 120 MG/0.8ML injection, Inject 0.8 mLs (120 mg total) into the skin daily., Disp: 10 Syringe, Rfl: 0 .  esomeprazole (NEXIUM) 20 MG capsule, Take 20 mg by mouth every morning. , Disp: , Rfl:  .  gabapentin (NEURONTIN) 600 MG tablet, Take 1 tablet (600 mg total) by mouth 3 (three) times daily., Disp: 90 tablet, Rfl: 3 .  oxyCODONE-acetaminophen (PERCOCET) 10-325 MG tablet, Take 1 tablet by mouth every 4 (four) hours as needed for pain., Disp: 30 tablet, Rfl: 0 .  pregabalin (LYRICA) 200 MG capsule, Take 1 capsule (200 mg total) 2 (two) times daily by mouth., Disp: 60 capsule, Rfl: 1 .  rosuvastatin (CRESTOR) 5 MG tablet, Take 1 tablet (5 mg total) by mouth daily., Disp: 30 tablet, Rfl: 3 .  sildenafil (VIAGRA) 25 MG tablet, 2-3 tabs po x 1. Take 30 minutes to 4 hours prior to sexual activity., Disp: 21 tablet, Rfl: 2 .  varenicline (CHANTIX STARTING MONTH PAK) 0.5 MG X 11 & 1 MG X 42  tablet, Take one 0.5 mg tablet by mouth once daily for 3 days, then increase to one 0.5 mg tablet twice daily for 4 days, then increase to one 1 mg tablet twice daily., Disp: 53 tablet, Rfl: 0 .  venlafaxine XR (EFFEXOR XR) 37.5 MG 24 hr capsule, Take 1 capsule (37.5 mg total) daily with breakfast by mouth., Disp: 90 capsule, Rfl: 1 .  Vitamin D, Ergocalciferol, (DRISDOL) 50000 units CAPS capsule, Take 1 capsule (50,000 Units total) by mouth every 7 (seven) days., Disp: 12 capsule, Rfl:  0 .  warfarin (COUMADIN) 1 MG tablet, Take 1 tablet (1 mg total) daily by mouth., Disp: 30 tablet, Rfl: 0 .  warfarin (COUMADIN) 6 MG tablet, Take 1 tablet (6 mg total) daily by mouth., Disp: 35 tablet, Rfl: 3  Allergies  Allergen Reactions  . Cymbalta [Duloxetine Hcl] Other (See Comments)    Headaches   Review of Systems   Pertinent items are noted in the HPI. Otherwise, ROS is negative.  Vitals:   Vitals:   05/04/17 0722  BP: 110/84  Pulse: 82  Temp: 98 F (36.7 C)  TempSrc: Oral  SpO2: 95%  Weight: 177 lb 12.8 oz (80.6 kg)  Height: 5\' 9"  (1.753 m)     Body mass index is 26.26 kg/m.   Physical Exam:   Physical Exam  Constitutional: He is oriented to person, place, and time. He appears well-developed and well-nourished. No distress.  HENT:  Head: Normocephalic and atraumatic.  Right Ear: External ear normal.  Left Ear: External ear normal.  Nose: Nose normal.  Mouth/Throat: Oropharynx is clear and moist.  Eyes: Conjunctivae and EOM are normal. Pupils are equal, round, and reactive to light.  Neck: Normal range of motion. Neck supple.  Cardiovascular: Normal rate, regular rhythm, normal heart sounds and intact distal pulses.  Pulmonary/Chest: Effort normal and breath sounds normal.  Abdominal: Soft. Bowel sounds are normal.  Musculoskeletal: Normal range of motion.  Neurological: He is alert and oriented to person, place, and time.  Skin: Skin is warm and dry.  Psychiatric: He has a normal mood and affect. His behavior is normal. Judgment and thought content normal.  Nursing note and vitals reviewed.   Results for orders placed or performed in visit on 05/04/17  CBC with Differential/Platelet  Result Value Ref Range   WBC 12.5 (H) 4.0 - 10.5 K/uL   RBC 5.43 4.22 - 5.81 Mil/uL   Hemoglobin 14.8 13.0 - 17.0 g/dL   HCT 46.6 39.0 - 52.0 %   MCV 85.8 78.0 - 100.0 fl   MCHC 31.8 30.0 - 36.0 g/dL   RDW 30.7 (H) 11.5 - 15.5 %   Platelets 193.0 150.0 - 400.0 K/uL    Neutrophils Relative % 74.2 43.0 - 77.0 %   Lymphocytes Relative 16.6 12.0 - 46.0 %   Monocytes Relative 4.6 3.0 - 12.0 %   Eosinophils Relative 3.8 0.0 - 5.0 %   Basophils Relative 0.8 0.0 - 3.0 %   Neutro Abs 9.3 (H) 1.4 - 7.7 K/uL   Lymphs Abs 2.1 0.7 - 4.0 K/uL   Monocytes Absolute 0.6 0.1 - 1.0 K/uL   Eosinophils Absolute 0.5 0.0 - 0.7 K/uL   Basophils Absolute 0.1 0.0 - 0.1 K/uL  Comprehensive metabolic panel  Result Value Ref Range   Sodium 140 135 - 145 mEq/L   Potassium 4.4 3.5 - 5.1 mEq/L   Chloride 107 96 - 112 mEq/L   CO2 25 19 - 32 mEq/L  Glucose, Bld 88 70 - 99 mg/dL   BUN 7 6 - 23 mg/dL   Creatinine, Ser 0.78 0.40 - 1.50 mg/dL   Total Bilirubin 0.5 0.2 - 1.2 mg/dL   Alkaline Phosphatase 84 39 - 117 U/L   AST 17 0 - 37 U/L   ALT 16 0 - 53 U/L   Total Protein 6.8 6.0 - 8.3 g/dL   Albumin 4.1 3.5 - 5.2 g/dL   Calcium 8.9 8.4 - 10.5 mg/dL   GFR 112.06 >60.00 mL/min  Lipid panel  Result Value Ref Range   Cholesterol 118 0 - 200 mg/dL   Triglycerides 122.0 0.0 - 149.0 mg/dL   HDL 29.40 (L) >39.00 mg/dL   VLDL 24.4 0.0 - 40.0 mg/dL   LDL Cholesterol 64 0 - 99 mg/dL   Total CHOL/HDL Ratio 4    NonHDL 88.54   PSA  Result Value Ref Range   PSA 0.32 0.10 - 4.00 ng/mL  Iron, ferritin Panel  Result Value Ref Range   Iron 40 (L) 50 - 180 mcg/dL   TIBC 368 250 - 425 mcg/dL (calc)   %SAT 11 (L) 15 - 60 % (calc)   Ferritin 26 20 - 380 ng/mL  VITAMIN D 25 Hydroxy (Vit-D Deficiency, Fractures)  Result Value Ref Range   VITD 34.75 30.00 - 100.00 ng/mL  POCT INR  Result Value Ref Range   INR 2.4    Assessment and Plan:   Diagnoses and all orders for this visit:  Iron deficiency anemia due to chronic blood loss Comments: Patient's last labs were disappointing to the patient and his family.  He has since then had an iron infusion.  He denies any new sources of bleeding.  Of note, he has had a fairly thorough workup and corrected hemorrhoids to see if that caused his  bleeding.  He was given the option previously of a capsule study.  He would like to pursue this now.  He asks that this happen prior to the end of the year for insurance purposes.   CBC Latest Ref Rng & Units 05/04/2017 02/28/2017 02/28/2017  WBC 4.0 - 10.5 K/uL 12.5(H) 15.5(H) -  Hemoglobin 13.0 - 17.0 g/dL 14.8 10.6(L) 10.8(L)  Hematocrit 39.0 - 52.0 % 46.6 35.7(L) 39.5  Platelets 150.0 - 400.0 K/uL 193.0 357 -   Lab Results  Component Value Date   FERRITIN 26 05/04/2017   Will also monitor patient's white count.  This is been consistently elevated.  We will see if infusions are potential cause. Orders: -     CBC with Differential/Platelet -     Iron, TIBC and Ferritin Panel -     Ambulatory referral to Gastroenterology  Dyslipidemia Comments: Recheck today at goal.  He will continue his current medication. Orders: -     Comprehensive metabolic panel -     Lipid panel  Chronic right-sided low back pain with right-sided sciatica Comments: Patient tolerated and has found some relief after his recent lumbar epidural.  He does continue to have pain especially when he works in the yard.  He also continues to have pain in his right foot.  He has found some relief with the Lyrica as well.  He tolerates the 200 mg dose easily.  We will go ahead and increase this to 300 mg to see if he tolerates it.  If it is too sedating he will call for it to be changed back. Orders: -     HYDROcodone-acetaminophen (NORCO) 10-325 MG  tablet; Take 1 tablet by mouth every 8 (eight) hours as needed. -     pregabalin (LYRICA) 300 MG capsule; Take 1 capsule (300 mg total) by mouth 2 (two) times daily.  PAD (peripheral artery disease) ([place]) Comments: Smoking cessation reviewed as below.  Low testosterone in male Comments: Testosterone level was low at the last blood draw.  This was redrawn today.  Patient does complain of low libido and erectile dysfunction.  We reviewed the risks associated with  testosterone replacement in him.  He would like to continue pursuing workup.  He would be interested in some form of testosterone replacement in the future if possible. Orders: -     PSA -     Home sleep test -     Testos,Total,Free and SHBG (Male)  Monitoring for anticoagulant use Comments: INR at goal today.  The patient does seem to do well with his current dosage.  He was subtherapeutic at his last visit but admits that he missed 1 dose.  No diet changes.  We discussed continuing his current regimen.  He will follow-up in Coumadin clinic. Orders: -     CBC with Differential/Platelet -     POCT INR -     Iron, TIBC and Ferritin Panel  Sleep-disordered breathing Comments: With patient's chronic medical issues as well as the low testosterone, sleep was discussed.  He does admit to snoring and sleep disordered breathing.  Will have him undergo home sleep study. Orders: -     Home sleep test  Chronic neck pain Comments: Patient states that he feels that he has a constant "crick" in his neck that radiates to his right shoulder.  He did attempts at relief with the facet/epidural injection a few months ago.  Unfortunately, it was unsuccessful in relieving any pain.  More recently, he underwent an epidural in his low back which was very helpful.  He would like to retry a cervical epidural.  This was ordered today.  He is taking Coumadin.  There is already a bridge protocol in place that he has completed easily.  We did review red flags.  He was given a refill of Norco as below.  Database reviewed.  No red flags. Orders: -     HYDROcodone-acetaminophen (NORCO) 10-325 MG tablet; Take 1 tablet by mouth every 8 (eight) hours as needed. -     DG INJECT DIAG/THERA/INC NEEDLE/CATH/PLC EPI/CERV/THOR W/IMG; Future  Tobacco abuse counseling Comments: The patient was counseled on the dangers of tobacco use, and was advised to quit.  Reviewed strategies to maximize success, including removing  cigarettes and smoking materials from environment, stress management, support of family/friends, written materials, local smoking cessation programs (1-800-QUIT-NOW and SMOKEFREE.GOV) and pharmacotherapy ([facility]). Greater than (10) minutes were spent on counseling today. Follow up in 3 months.  Vitamin D deficiency -     VITAMIN D 25 Hydroxy (Vit-D Deficiency, Fractures)  Paresthesia and pain of right extremity -     HYDROcodone-acetaminophen (NORCO) 10-325 MG tablet; Take 1 tablet by mouth every 8 (eight) hours as needed. -     cyanocobalamin ((VITAMIN B-12)) injection 1,000 mcg   Records requested if needed. Time spent with the patient: 60 minutes, of which >50% was spent in obtaining information about his symptoms, reviewing his previous labs, evaluations, and treatments, counseling him about his condition (please see the discussed topics above), and developing a plan to further investigate it; he had a number of questions which I addressed.   . Reviewed expectations  re: course of current medical issues. . Discussed self-management of symptoms. . Outlined signs and symptoms indicating need for more acute intervention. . Patient verbalized understanding and all questions were answered. Marland Kitchen Health Maintenance issues including appropriate healthy diet, exercise, and smoking avoidance were discussed with patient. . See orders for this visit as documented in the electronic medical record. . Patient received an After Visit Summary.  Briscoe Deutscher, DO [place], [place] 05/05/2017  Future Appointments  Date Time Provider [place]  05/16/2017  8:00 AM [facility]  05/17/2017  7:30 AM [facility] RM 3 [facility]  05/23/2017  8:00 AM [facility]

## 2017-05-05 ENCOUNTER — Encounter: Payer: Self-pay | Admitting: Family Medicine

## 2017-05-07 ENCOUNTER — Other Ambulatory Visit: Payer: Self-pay

## 2017-05-07 ENCOUNTER — Other Ambulatory Visit: Payer: Self-pay | Admitting: Surgical

## 2017-05-07 DIAGNOSIS — E611 Iron deficiency: Secondary | ICD-10-CM

## 2017-05-07 DIAGNOSIS — D500 Iron deficiency anemia secondary to blood loss (chronic): Secondary | ICD-10-CM

## 2017-05-07 NOTE — Telephone Encounter (Signed)
Yes, I just received a message from Dr. Juleen China stating that this patient continues to have iron deficiency anemia requiring iron infusions.  Please schedule a small bowel video capsule study, diagnosis iron deficiency anemia.  Apparently, the patient is hoping to get it done by the end of this calendar year for insurance reasons.  Please see what you can do, though I am not certain if that will be feasible with the upcoming holiday and the need for prior insurance authorization.

## 2017-05-08 NOTE — Telephone Encounter (Signed)
Got approval for endocapsule, scheduled for 05/18/17 at 8:00 in office. Patient aware. He gave okay for his wife to pick up his prep instructions. Envelop at our front desk with prep instruction,date/time of appointment.

## 2017-05-09 ENCOUNTER — Ambulatory Visit (HOSPITAL_COMMUNITY)
Admission: RE | Admit: 2017-05-09 | Discharge: 2017-05-09 | Disposition: A | Discharge status: Home / Self Care | Payer: 59 | Source: Ambulatory Visit | Attending: Family Medicine | Admitting: Family Medicine

## 2017-05-09 DIAGNOSIS — E611 Iron deficiency: Secondary | ICD-10-CM | POA: Diagnosis not present

## 2017-05-09 MED ORDER — SODIUM CHLORIDE 0.9 % IV SOLN
750.0000 mg | Freq: Once | INTRAVENOUS | Status: AC
  Administered 2017-05-09: 750 mg via INTRAVENOUS
  Filled 2017-05-09: qty 15

## 2017-05-09 NOTE — Progress Notes (Signed)
Pt admitted to [facility] for IV Injectafer infusion. Infusion completed without complication. VSS, pt tolerated well.  Pt discharged to home in stable ambulatory condition.  Coolidge Breeze, RN 05/09/2017

## 2017-05-09 NOTE — Discharge Instructions (Signed)
Ferric carboxymaltose injection What is this medicine? FERRIC CARBOXYMALTOSE (ferr-ik car-box-ee-mol-toes) is an iron complex. Iron is used to make healthy red blood cells, which carry oxygen and nutrients throughout the body. This medicine is used to treat anemia in people with chronic kidney disease or people who cannot take iron by mouth. This medicine may be used for other purposes; ask your health care provider or pharmacist if you have questions. COMMON BRAND NAME(S): Injectafer What should I tell my health care provider before I take this medicine? They need to know if you have any of these conditions: -anemia not caused by low iron levels -high levels of iron in the blood -liver disease -an unusual or allergic reaction to iron, other medicines, foods, dyes, or preservatives -pregnant or trying to get pregnant -breast-feeding How should I use this medicine? This medicine is for infusion into a vein. It is given by a health care professional in a hospital or clinic setting. Talk to your pediatrician regarding the use of this medicine in children. Special care may be needed. Overdosage: If you think you have taken too much of this medicine contact a poison control center or emergency room at once. NOTE: This medicine is only for you. Do not share this medicine with others. What if I miss a dose? It is important not to miss your dose. Call your doctor or health care professional if you are unable to keep an appointment. What may interact with this medicine? Do not take this medicine with any of the following medications: -deferoxamine -dimercaprol -other iron products This medicine may also interact with the following medications: -chloramphenicol -deferasirox This list may not describe all possible interactions. Give your health care provider a list of all the medicines, herbs, non-prescription drugs, or dietary supplements you use. Also tell them if you smoke, drink alcohol, or use  illegal drugs. Some items may interact with your medicine. What should I watch for while using this medicine? Visit your doctor or health care professional regularly. Tell your doctor if your symptoms do not start to get better or if they get worse. You may need blood work done while you are taking this medicine. You may need to follow a special diet. Talk to your doctor. Foods that contain iron include: whole grains/cereals, dried fruits, beans, or peas, leafy green vegetables, and organ meats (liver, kidney). What side effects may I notice from receiving this medicine? Side effects that you should report to your doctor or health care professional as soon as possible: -allergic reactions like skin rash, itching or hives, swelling of the face, lips, or tongue -breathing problems -changes in blood pressure -feeling faint or lightheaded, falls -flushing, sweating, or hot feelings Side effects that usually do not require medical attention (report to your doctor or health care professional if they continue or are bothersome): -changes in taste -constipation -dizziness -headache -nausea -pain, redness, or irritation at site where injected -vomiting This list may not describe all possible side effects. Call your doctor for medical advice about side effects. You may report side effects to FDA at 1-800-FDA-1088. Where should I keep my medicine? This drug is given in a hospital or clinic and will not be stored at home. NOTE: This sheet is a summary. It may not cover all possible information. If you have questions about this medicine, talk to your doctor, pharmacist, or health care provider.  2018 Elsevier/Gold Standard (2015-06-10 11:20:47)  

## 2017-05-14 LAB — IRON,TIBC AND FERRITIN PANEL
%SAT: 11 % (calc) — ABNORMAL LOW (ref 15–60)
Ferritin: 26 ng/mL (ref 20–380)
Iron: 40 ug/dL — ABNORMAL LOW (ref 50–180)
TIBC: 368 mcg/dL (calc) (ref 250–425)

## 2017-05-14 LAB — TESTOS,TOTAL,FREE AND SHBG (FEMALE)
Free Testosterone: 48.5 pg/mL (ref 35.0–155.0)
Sex Hormone Binding: 36 nmol/L (ref 10–50)
Testosterone, Total, LC-MS-MS: 359 ng/dL (ref 250–1100)

## 2017-05-16 ENCOUNTER — Ambulatory Visit: Payer: 59

## 2017-05-16 ENCOUNTER — Ambulatory Visit (INDEPENDENT_AMBULATORY_CARE_PROVIDER_SITE_OTHER): Payer: 59 | Admitting: General Practice

## 2017-05-16 DIAGNOSIS — D6859 Other primary thrombophilia: Secondary | ICD-10-CM

## 2017-05-16 DIAGNOSIS — Z7901 Long term (current) use of anticoagulants: Secondary | ICD-10-CM

## 2017-05-16 LAB — POCT INR: INR: 1.2

## 2017-05-16 MED FILL — ROSUVASTATIN CALCIUM 5 MG T: 5 | 30 days supply | Qty: 30 | Fill #3

## 2017-05-16 NOTE — Progress Notes (Signed)
I have reviewed and agree with this pan

## 2017-05-16 NOTE — Patient Instructions (Addendum)
Pre visit review using our clinic review tool, if applicable. No additional management support is needed unless otherwise documented below in the visit note.  Take 9 mg today and tomorrow (11/19 and 11/20) and then continue to take 6 mg daily except 7 mg on Tuesdays.  (Follow previous instructions)

## 2017-05-17 ENCOUNTER — Ambulatory Visit
Admission: RE | Admit: 2017-05-17 | Discharge: 2017-05-17 | Disposition: A | Discharge status: Home / Self Care | Payer: 59 | Source: Ambulatory Visit | Attending: Family Medicine | Admitting: Family Medicine

## 2017-05-17 DIAGNOSIS — M4322 Fusion of spine, cervical region: Secondary | ICD-10-CM | POA: Diagnosis not present

## 2017-05-17 DIAGNOSIS — M542 Cervicalgia: Principal | ICD-10-CM

## 2017-05-17 DIAGNOSIS — G8929 Other chronic pain: Secondary | ICD-10-CM

## 2017-05-17 MED ORDER — IOPAMIDOL (ISOVUE-M 300) INJECTION 61%
1.0000 mL | Freq: Once | INTRAMUSCULAR | Status: AC | PRN
  Administered 2017-05-17: 1 mL via EPIDURAL

## 2017-05-17 MED ORDER — TRIAMCINOLONE ACETONIDE 40 MG/ML IJ SUSP (RADIOLOGY)
60.0000 mg | Freq: Once | INTRAMUSCULAR | Status: AC
  Administered 2017-05-17: 60 mg via EPIDURAL

## 2017-05-18 ENCOUNTER — Ambulatory Visit (INDEPENDENT_AMBULATORY_CARE_PROVIDER_SITE_OTHER): Payer: 59 | Admitting: Gastroenterology

## 2017-05-18 DIAGNOSIS — D500 Iron deficiency anemia secondary to blood loss (chronic): Secondary | ICD-10-CM | POA: Diagnosis not present

## 2017-05-18 NOTE — Progress Notes (Signed)
Pt tolerated capsule well, verbalized understanding of all written and verbal instructions.    [id number]-C  Lot [id number] EXP 09-10-2018

## 2017-05-23 ENCOUNTER — Ambulatory Visit (INDEPENDENT_AMBULATORY_CARE_PROVIDER_SITE_OTHER): Payer: 59 | Admitting: General Practice

## 2017-05-23 ENCOUNTER — Ambulatory Visit: Payer: 59

## 2017-05-23 DIAGNOSIS — D6859 Other primary thrombophilia: Secondary | ICD-10-CM

## 2017-05-23 DIAGNOSIS — Z7901 Long term (current) use of anticoagulants: Secondary | ICD-10-CM

## 2017-05-23 LAB — POCT INR: INR: 2.9

## 2017-05-23 NOTE — Progress Notes (Signed)
I have reviewed and agree with this plan  

## 2017-05-23 NOTE — Patient Instructions (Addendum)
Pre visit review using our clinic review tool, if applicable. No additional management support is needed unless otherwise documented below in the visit note.  Continue to take 6 mg daily except 7 mg on Tuesdays. Re-check in 4 week.

## 2017-05-29 MED FILL — HYDROCODON-APAP 10-325: 10-325 | 20 days supply | Qty: 60 | Fill #0

## 2017-06-01 DIAGNOSIS — G4710 Hypersomnia, unspecified: Secondary | ICD-10-CM | POA: Diagnosis not present

## 2017-06-04 MED FILL — WARFARIN SODIUM 6 MG TABLET: 6 | 30 days supply | Qty: 30 | Fill #2

## 2017-06-06 ENCOUNTER — Telehealth: Payer: Self-pay

## 2017-06-06 DIAGNOSIS — R0683 Snoring: Secondary | ICD-10-CM

## 2017-06-06 NOTE — Telephone Encounter (Signed)
-----   Message from Marlon Pel, RN sent at 05/24/2017  3:42 PM EST ----- Regarding: RE: Annabelle Harman Remo Lipps said we would bill for the first capsule and repeat the procedure with no charge.  This way it will not hit his deductible for this year.  ----- Message ----- From: Doristine Counter, RN Sent: 05/24/2017   3:15 PM To: Marlon Pel, RN Subject: FW: Capsule                                    Please see below. Thanks, Almyra Free ----- Message ----- From: Alfredia Ferguson, PA-C Sent: 05/24/2017   2:45 PM To: Doran Stabler, MD, Doristine Counter, RN Subject: Capsule                                        Capsule is incomplete- data only recorded for 4 hours . Apparently pt was aware recorder stopped blinking at some point after he got home - initially it would not download , then after that issue was fixed  Unfortunately we only see 4 hours of images. There is duodenitis , few erosions , but study will need to be repeated .  Almyra Free - please discuss with [name] - pt should not be charged for one of the studies . Equipment failure should not be on patient.  Thanks Also find out if he passed the capsule

## 2017-06-06 NOTE — Telephone Encounter (Signed)
Called patient states that he received letter from company saying they were sending a different sleep study to him. I got the contact number for office and called at [phone]. Representative for them states that original referral was placed on 05/07/17 and sleep study was done on 05/23/17. That per the report that she is looking at another order is needed to have second sleep study done. Do you want me to send it to them or another office?   I have called patient back to ask him but no answer no v/m

## 2017-06-06 NOTE — Telephone Encounter (Signed)
Understood 

## 2017-06-06 NOTE — Telephone Encounter (Signed)
Patient passed the capsule the next day. He is aware that our capsule equipment in broken and cannot be repaired. He understands his options are to either get scheduled to have it at [facility], but that he would be billed for it in 2019 or to wait until our new equipment arrives. Right now he is choosing to wait for our new equipment.

## 2017-06-06 NOTE — Telephone Encounter (Signed)
Sleep study was "inconclusive." Needs repeat. Let him know and see what he thinks.

## 2017-06-06 NOTE — Telephone Encounter (Signed)
Okay to resend. 

## 2017-06-06 NOTE — Telephone Encounter (Signed)
Sleep study received put in your green folder for review.

## 2017-06-07 NOTE — Telephone Encounter (Signed)
Order put in.

## 2017-06-14 ENCOUNTER — Telehealth: Payer: Self-pay | Admitting: Family Medicine

## 2017-06-14 NOTE — Telephone Encounter (Signed)
Rec'd from [facility] forwarded 5 pages to [employer] DO

## 2017-06-18 ENCOUNTER — Encounter: Payer: Self-pay | Admitting: Family Medicine

## 2017-06-18 ENCOUNTER — Ambulatory Visit (INDEPENDENT_AMBULATORY_CARE_PROVIDER_SITE_OTHER): Payer: 59 | Admitting: General Practice

## 2017-06-18 ENCOUNTER — Ambulatory Visit (INDEPENDENT_AMBULATORY_CARE_PROVIDER_SITE_OTHER): Payer: 59 | Admitting: Family Medicine

## 2017-06-18 VITALS — BP 118/82 | HR 96 | Temp 97.6°F | Wt 171.8 lb

## 2017-06-18 DIAGNOSIS — E559 Vitamin D deficiency, unspecified: Secondary | ICD-10-CM | POA: Diagnosis not present

## 2017-06-18 DIAGNOSIS — D500 Iron deficiency anemia secondary to blood loss (chronic): Secondary | ICD-10-CM

## 2017-06-18 DIAGNOSIS — Z720 Tobacco use: Secondary | ICD-10-CM

## 2017-06-18 DIAGNOSIS — F419 Anxiety disorder, unspecified: Secondary | ICD-10-CM

## 2017-06-18 DIAGNOSIS — Z7901 Long term (current) use of anticoagulants: Secondary | ICD-10-CM | POA: Diagnosis not present

## 2017-06-18 DIAGNOSIS — D6859 Other primary thrombophilia: Secondary | ICD-10-CM

## 2017-06-18 DIAGNOSIS — E538 Deficiency of other specified B group vitamins: Secondary | ICD-10-CM | POA: Diagnosis not present

## 2017-06-18 DIAGNOSIS — M5441 Lumbago with sciatica, right side: Secondary | ICD-10-CM | POA: Diagnosis not present

## 2017-06-18 DIAGNOSIS — D729 Disorder of white blood cells, unspecified: Secondary | ICD-10-CM | POA: Insufficient documentation

## 2017-06-18 DIAGNOSIS — G8929 Other chronic pain: Secondary | ICD-10-CM | POA: Diagnosis not present

## 2017-06-18 DIAGNOSIS — D72829 Elevated white blood cell count, unspecified: Secondary | ICD-10-CM | POA: Diagnosis not present

## 2017-06-18 DIAGNOSIS — E782 Mixed hyperlipidemia: Secondary | ICD-10-CM

## 2017-06-18 LAB — CBC WITH DIFFERENTIAL/PLATELET
Basophils Absolute: 0.1 10*3/uL (ref 0.0–0.1)
Basophils Relative: 0.6 % (ref 0.0–3.0)
Eosinophils Absolute: 0.3 10*3/uL (ref 0.0–0.7)
Eosinophils Relative: 2.1 % (ref 0.0–5.0)
HCT: 52.2 % — ABNORMAL HIGH (ref 39.0–52.0)
Hemoglobin: 17.7 g/dL — ABNORMAL HIGH (ref 13.0–17.0)
Lymphocytes Relative: 17.4 % (ref 12.0–46.0)
Lymphs Abs: 2.7 10*3/uL (ref 0.7–4.0)
MCHC: 33.8 g/dL (ref 30.0–36.0)
MCV: 92.1 fl (ref 78.0–100.0)
Monocytes Absolute: 0.9 10*3/uL (ref 0.1–1.0)
Monocytes Relative: 5.7 % (ref 3.0–12.0)
Neutro Abs: 11.7 10*3/uL — ABNORMAL HIGH (ref 1.4–7.7)
Neutrophils Relative %: 74.2 % (ref 43.0–77.0)
Platelets: 238 10*3/uL (ref 150.0–400.0)
RBC: 5.66 Mil/uL (ref 4.22–5.81)
RDW: 22.6 % — ABNORMAL HIGH (ref 11.5–15.5)
WBC: 15.8 10*3/uL — ABNORMAL HIGH (ref 4.0–10.5)

## 2017-06-18 LAB — VITAMIN B12: Vitamin B-12: 661 pg/mL (ref 211–911)

## 2017-06-18 LAB — POCT INR: INR: 2.4

## 2017-06-18 MED ORDER — ROSUVASTATIN CALCIUM 5 MG PO TABS: 5.0000 mg | ORAL_TABLET | Freq: Every day | ORAL | 3 refills | Status: DC

## 2017-06-18 MED ORDER — VARENICLINE TARTRATE 1 MG PO TABS: 1.0000 mg | ORAL_TABLET | Freq: Two times a day (BID) | ORAL | 0 refills | Status: DC

## 2017-06-18 MED ORDER — OXYCODONE-ACETAMINOPHEN 10-325 MG PO TABS: 1.0000 | ORAL_TABLET | Freq: Three times a day (TID) | ORAL | 0 refills | Status: DC | PRN

## 2017-06-18 MED ORDER — PROPRANOLOL HCL 10 MG PO TABS: 10.0000 mg | ORAL_TABLET | Freq: Three times a day (TID) | ORAL | 2 refills | Status: DC

## 2017-06-18 MED FILL — OXYCODONE-APAP 10-325 MG TA: 10-325 | 30 days supply | Qty: 90 | Fill #0

## 2017-06-18 MED FILL — PROPRANOLOL HCL 10 MG TAB: 10 | 10 days supply | Qty: 30 | Fill #0

## 2017-06-18 MED FILL — ROSUVASTATIN CALCIUM 5 MG T: 5 | 30 days supply | Qty: 30 | Fill #0

## 2017-06-18 MED FILL — CHANTIX 1 MG CONT MONTH BOX: 1 | 28 days supply | Qty: 56 | Fill #0

## 2017-06-18 NOTE — Progress Notes (Signed)
[name] is a 52 y.o. male is here for follow up.  History of Present Illness:   HPI: See Assessment and Plan section for Problem Based Charting of issues discussed today.   There are no preventive care reminders to display for this patient.   Depression screen [facility] 2/9 07/08/2016 07/05/2016 03/08/2015  Decreased Interest 0 1 0  Down, Depressed, Hopeless 0 0 0  PHQ - 2 Score 0 1 0   PMHx, SurgHx, SocialHx, FamHx, Medications, and Allergies were reviewed in the Visit Navigator and updated as appropriate.   Patient Active Problem List   Diagnosis Date Noted  . Leukocytosis 06/18/2017  . Low testosterone in male 05/04/2017  . Erectile dysfunction 11/19/2016  . Chronic right-sided low back pain with right-sided sciatica 11/19/2016  . Internal hemorrhoid 11/19/2016  . 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 Arthritis of left acromioclavicular joint 09/29/2016  . Monitoring for anticoagulant use 09/15/2016  . Long term (current) use of anticoagulants [Z79.01] 09/15/2016  . Primary hypercoagulable state ([place]) [D68.59] 09/15/2016  . History of alcohol abuse 07/11/2016  . Chronic left shoulder pain 07/08/2016  . Iron deficiency anemia due to chronic blood loss   . Gastroesophageal reflux disease   . Dyslipidemia 06/27/2016  . Antiphospholipid antibody positive 06/27/2016  . Shortness of breath 06/02/2016  . Thrombus 06/02/2016  . PAD (peripheral artery disease) ([place]) 06/02/2016  . Claudication ([place]) 06/02/2016  . 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 Atherosclerosis of native arteries of extremity with intermittent claudication ([place]) 02/24/2016  . Weakness of right leg 01/11/2016  . Piriformis syndrome of right side 12/29/2015  . Leg length discrepancy 12/29/2015  . Anxiety with depression 06/24/2014  . Nicotine abuse 06/24/2014   Social History   Tobacco Use  . Smoking status: Current Every Day Smoker    Packs/day: 1.00    Years: 33.00    Pack years: 33.00   Types: Cigarettes, E-cigarettes  . Smokeless tobacco: Never Used  Substance Use Topics  . Alcohol use: No    Comment: per pt recovering alcoholic in remission since 04-25-2016  . Drug use: No   Current Medications and Allergies:   .  albuterol (PROVENTIL HFA;VENTOLIN HFA) 108 (90 Base) MCG/ACT inhaler, Inhale 2 puffs into the lungs every 6 (six) hours as needed for wheezing or shortness of breath., Disp: 1 Inhaler, Rfl: 2 .  enoxaparin (LOVENOX) 120 MG/0.8ML injection, Inject 0.8 mLs (120 mg total) into the skin daily., Disp: 4 Syringe, Rfl: 0 .  esomeprazole (NEXIUM) 20 MG capsule, Take 20 mg by mouth every morning. , Disp: , Rfl:  .  HYDROcodone-acetaminophen (NORCO) 10-325 MG tablet, Take 1 tablet by mouth every 8 (eight) hours as needed., Disp: 60 tablet, Rfl: 0 .  oxyCODONE-acetaminophen (PERCOCET) 10-325 MG tablet, Take 1 tablet by mouth every 4 (four) hours as needed for pain., Disp: 30 tablet, Rfl: 0 .  pregabalin (LYRICA) 300 MG capsule, Take 1 capsule (300 mg total) by mouth 2 (two) times daily., Disp: 60 capsule, Rfl: 3 .  rosuvastatin (CRESTOR) 5 MG tablet, Take 1 tablet (5 mg total) by mouth daily., Disp: 30 tablet, Rfl: 3 .  sildenafil (VIAGRA) 25 MG tablet, 2-3 tabs po x 1. Take 30 minutes to 4 hours prior to sexual activity., Disp: 21 tablet, Rfl: 2 .  varenicline (CHANTIX STARTING MONTH PAK) 0.5 MG X 11 & 1 MG X 42 tablet, Take one 0.5 mg tablet by mouth once daily for 3 days, then increase to one 0.5 mg tablet  twice daily for 4 days, then increase to one 1 mg tablet twice daily., Disp: 53 tablet, Rfl: 0 .  venlafaxine XR (EFFEXOR XR) 37.5 MG 24 hr capsule, Take 1 capsule (37.5 mg total) daily with breakfast by mouth., Disp: 90 capsule, Rfl: 1 .  Vitamin D, Ergocalciferol, (DRISDOL) 50000 units CAPS capsule, Take 1 capsule (50,000 Units total) by mouth every 7 (seven) days., Disp: 12 capsule, Rfl: 0 .  warfarin (COUMADIN) 1 MG tablet, Take 1 tablet (1 mg total) daily by mouth.,  Disp: 30 tablet, Rfl: 0 .  warfarin (COUMADIN) 6 MG tablet, Take 1 tablet (6 mg total) daily by mouth., Disp: 35 tablet, Rfl: 3  Allergies  Allergen Reactions  . Cymbalta [Duloxetine Hcl] Other (See Comments)    Headaches   Review of Systems   Pertinent items are noted in the HPI. Otherwise, ROS is negative.  Vitals:   Vitals:   06/18/17 1040  BP: 118/82  Pulse: 96  Temp: 97.6 F (36.4 C)  TempSrc: Oral  SpO2: 96%  Weight: 171 lb 12.8 oz (77.9 kg)     Body mass index is 25.37 kg/m.   Physical Exam:   Physical Exam  Constitutional: He is oriented to person, place, and time. He appears well-developed and well-nourished. No distress.  HENT:  Head: Normocephalic and atraumatic.  Right Ear: External ear normal.  Left Ear: External ear normal.  Nose: Nose normal.  Mouth/Throat: Oropharynx is clear and moist.  Eyes: Conjunctivae and EOM are normal. Pupils are equal, round, and reactive to light.  Neck: Normal range of motion. Neck supple.  Cardiovascular: Normal rate, regular rhythm, normal heart sounds and intact distal pulses.  Pulmonary/Chest: Effort normal and breath sounds normal.  Abdominal: Soft. Bowel sounds are normal.  Musculoskeletal: Normal range of motion.  Neurological: He is alert and oriented to person, place, and time.  Skin: Skin is warm and dry.  Psychiatric: He has a normal mood and affect. His behavior is normal. Judgment and thought content normal.  Nursing note and vitals reviewed.   Results for orders placed or performed in visit on 06/18/17  CBC with Differential/Platelet  Result Value Ref Range   WBC 15.8 (H) 4.0 - 10.5 K/uL   RBC 5.66 4.22 - 5.81 Mil/uL   Hemoglobin 17.7 (H) 13.0 - 17.0 g/dL   HCT 52.2 (H) 39.0 - 52.0 %   MCV 92.1 78.0 - 100.0 fl   MCHC 33.8 30.0 - 36.0 g/dL   RDW 22.6 (H) 11.5 - 15.5 %   Platelets 238.0 150.0 - 400.0 K/uL   Neutrophils Relative % 74.2 43.0 - 77.0 %   Lymphocytes Relative 17.4 12.0 - 46.0 %   Monocytes  Relative 5.7 3.0 - 12.0 %   Eosinophils Relative 2.1 0.0 - 5.0 %   Basophils Relative 0.6 0.0 - 3.0 %   Neutro Abs 11.7 (H) 1.4 - 7.7 K/uL   Lymphs Abs 2.7 0.7 - 4.0 K/uL   Monocytes Absolute 0.9 0.1 - 1.0 K/uL   Eosinophils Absolute 0.3 0.0 - 0.7 K/uL   Basophils Absolute 0.1 0.0 - 0.1 K/uL  Iron, TIBC and Ferritin Panel  Result Value Ref Range   Iron 112 50 - 180 mcg/dL   TIBC 336 250 - 425 mcg/dL (calc)   %SAT 33 15 - 60 % (calc)   Ferritin 145 20 - 380 ng/mL  B12  Result Value Ref Range   Vitamin B-12 661 211 - 911 pg/mL   Assessment and Plan:  1. Iron deficiency anemia due to chronic blood loss  CBC Latest Ref Rng & Units 06/18/2017 05/04/2017 02/28/2017  WBC 4.0 - 10.5 K/uL 15.8(H) 12.5(H) 15.5(H)  Hemoglobin 13.0 - 17.0 g/dL 17.7(H) 14.8 10.6(L)  Hematocrit 39.0 - 52.0 % 52.2(H) 46.6 35.7(L)  Platelets 150.0 - 400.0 K/uL 238.0 193.0 357   Lab Results  Component Value Date   IRON 112 06/18/2017   TIBC 336 06/18/2017   FERRITIN 145 06/18/2017   Patient has a capsule study scheduled for 6 weeks from now. Latest Hgb is very reassuring.   - CBC with Differential/Platelet - Iron, TIBC and Ferritin Panel  2. Nicotine abuse I advised patient to quit smoking, and offered support. [place] QUITLINE: 1-800-QUIT-NOW [phone]).  - varenicline (CHANTIX CONTINUING MONTH PAK) 1 MG tablet; Take 1 tablet (1 mg total) by mouth 2 (two) times daily.  Dispense: 90 tablet; Refill: 0  3. Chronic right-sided low back pain with right-sided sciatica Lumbar radicular pain improved after facet injections. He still has trouble with the right foot. Lyrica is very expensive and will not continue to be affordable. He would like to try Neurontin again, but is worried about sedation. Will continue pain control below prn.   - oxyCODONE-acetaminophen (PERCOCET) 10-325 MG tablet; Take 1 tablet by mouth every 8 (eight) hours as needed for pain.  Dispense: 90 tablet; Refill: 0  4. Leukocytosis,  unspecified type Reviewed with Heme, not concerned about borderline neutrophilia as it is likely reactive.   5. B12 deficiency - B12  6. Vitamin D deficiency - Vitamin D 1,25 dihydroxy  7. Anxiety After discussion, patient would like to start below medication. Expectations, risks, and potential side effects reviewed.   - propranolol (INDERAL) 10 MG tablet; Take 1 tablet (10 mg total) by mouth 3 (three) times daily.  Dispense: 30 tablet; Refill: 2  8. Mixed hyperlipidemia Is the patient taking medications without problems? [x]   YES  []   NO Cardiovascular ROS: no chest pain or dyspnea on exertion.   Lipids:    Component Value Date/Time   CHOL 118 05/04/2017 0831   TRIG 122.0 05/04/2017 0831   HDL 29.40 (L) 05/04/2017 0831   VLDL 24.4 05/04/2017 0831   CHOLHDL 4 05/04/2017 0831   - rosuvastatin (CRESTOR) 5 MG tablet; Take 1 tablet (5 mg total) by mouth daily.  Dispense: 30 tablet; Refill: 3   . Reviewed expectations re: course of current medical issues. . Discussed self-management of symptoms. . Outlined signs and symptoms indicating need for more acute intervention. . Patient verbalized understanding and all questions were answered. Marland Kitchen Health Maintenance issues including appropriate healthy diet, exercise, and smoking avoidance were discussed with patient. . See orders for this visit as documented in the electronic medical record. . Patient received an After Visit Summary.  Briscoe Deutscher, DO [place], [place] 06/22/2017

## 2017-06-18 NOTE — Patient Instructions (Addendum)
Pre visit review using our clinic review tool, if applicable. No additional management support is needed unless otherwise documented below in the visit note.  Continue to take 6 mg daily except 7 mg on Tuesdays. Re-check in 6 weeks.

## 2017-06-20 ENCOUNTER — Encounter: Payer: Self-pay | Admitting: Family Medicine

## 2017-06-20 ENCOUNTER — Ambulatory Visit: Payer: 59

## 2017-06-20 DIAGNOSIS — R7989 Other specified abnormal findings of blood chemistry: Secondary | ICD-10-CM

## 2017-06-20 DIAGNOSIS — Z720 Tobacco use: Secondary | ICD-10-CM

## 2017-06-20 DIAGNOSIS — R072 Precordial pain: Secondary | ICD-10-CM

## 2017-06-21 ENCOUNTER — Telehealth: Payer: Self-pay | Admitting: Gastroenterology

## 2017-06-21 NOTE — Telephone Encounter (Signed)
FYI

## 2017-06-21 NOTE — Telephone Encounter (Signed)
Patient states he is wanting to wait till the new capsule endo comes in instead of going to the hosp and would like to let Dr.Danis and nurse Almyra Free know. FYI

## 2017-06-22 ENCOUNTER — Encounter: Payer: Self-pay | Admitting: Family Medicine

## 2017-06-22 LAB — VITAMIN D 1,25 DIHYDROXY
Vitamin D 1, 25 (OH)2 Total: 39 pg/mL (ref 18–72)
Vitamin D2 1, 25 (OH)2: <8 pg/mL
Vitamin D3 1, 25 (OH)2: 39 pg/mL

## 2017-06-22 LAB — IRON,TIBC AND FERRITIN PANEL
%SAT: 33 % (calc) (ref 15–60)
Ferritin: 145 ng/mL (ref 20–380)
Iron: 112 ug/dL (ref 50–180)
TIBC: 336 mcg/dL (calc) (ref 250–425)

## 2017-06-22 NOTE — Telephone Encounter (Signed)
Understood, thanks

## 2017-07-09 MED FILL — GABAPENTIN 600 MG TABLET: 600 | 30 days supply | Qty: 90 | Fill #2

## 2017-07-09 MED FILL — WARFARIN SODIUM 6 MG TABLET: 6 | 30 days supply | Qty: 30 | Fill #3

## 2017-07-10 ENCOUNTER — Ambulatory Visit (INDEPENDENT_AMBULATORY_CARE_PROVIDER_SITE_OTHER)
Admission: RE | Admit: 2017-07-10 | Discharge: 2017-07-10 | Disposition: A | Discharge status: Home / Self Care | Payer: 59 | Source: Ambulatory Visit | Attending: Family Medicine | Admitting: Family Medicine

## 2017-07-10 DIAGNOSIS — J432 Centrilobular emphysema: Secondary | ICD-10-CM | POA: Diagnosis not present

## 2017-07-10 DIAGNOSIS — Z720 Tobacco use: Secondary | ICD-10-CM

## 2017-07-10 DIAGNOSIS — R072 Precordial pain: Secondary | ICD-10-CM | POA: Diagnosis not present

## 2017-07-10 DIAGNOSIS — R7989 Other specified abnormal findings of blood chemistry: Secondary | ICD-10-CM

## 2017-07-11 ENCOUNTER — Telehealth: Payer: Self-pay | Admitting: Gastroenterology

## 2017-07-11 NOTE — Telephone Encounter (Signed)
Spoke to patient let him know that we have still not received our new equipment for the capsule endoscopy, again offered to schedule him at the hospital and he declined. He would like to wait for the new capsule study. Assured him that when we get it, will contact him to set this test up.

## 2017-07-11 NOTE — Telephone Encounter (Signed)
Patient wanting to make sure he is still on capsule endo list.

## 2017-07-16 MED FILL — ROSUVASTATIN CALCIUM 5 MG T: 5 | 30 days supply | Qty: 30 | Fill #1

## 2017-07-25 ENCOUNTER — Encounter: Payer: Self-pay | Admitting: Family Medicine

## 2017-07-26 ENCOUNTER — Other Ambulatory Visit: Payer: Self-pay | Admitting: Family Medicine

## 2017-07-26 DIAGNOSIS — M5441 Lumbago with sciatica, right side: Principal | ICD-10-CM

## 2017-07-26 DIAGNOSIS — G8929 Other chronic pain: Secondary | ICD-10-CM

## 2017-07-26 MED ORDER — OXYCODONE-ACETAMINOPHEN 10-325 MG PO TABS: 1.0000 | ORAL_TABLET | Freq: Three times a day (TID) | ORAL | 0 refills | Status: DC | PRN

## 2017-07-26 MED FILL — OXYCODONE-APAP 10-325 MG TA: 10-325 | 30 days supply | Qty: 90 | Fill #0

## 2017-07-30 ENCOUNTER — Other Ambulatory Visit: Payer: Self-pay | Admitting: Surgical

## 2017-07-30 ENCOUNTER — Ambulatory Visit (INDEPENDENT_AMBULATORY_CARE_PROVIDER_SITE_OTHER): Payer: 59 | Admitting: General Practice

## 2017-07-30 DIAGNOSIS — D500 Iron deficiency anemia secondary to blood loss (chronic): Secondary | ICD-10-CM | POA: Diagnosis not present

## 2017-07-30 DIAGNOSIS — D6859 Other primary thrombophilia: Secondary | ICD-10-CM | POA: Diagnosis not present

## 2017-07-30 DIAGNOSIS — Z7901 Long term (current) use of anticoagulants: Secondary | ICD-10-CM | POA: Diagnosis not present

## 2017-07-30 LAB — POCT INR: INR: 2.7

## 2017-07-30 NOTE — Patient Instructions (Addendum)
Pre visit review using our clinic review tool, if applicable. No additional management support is needed unless otherwise documented below in the visit note.  Continue to take 6 mg daily except 7 mg on Tuesdays. Re-check in 6 weeks.

## 2017-07-31 LAB — CBC WITH DIFFERENTIAL/PLATELET
Basophils Absolute: 0.1 10*3/uL (ref 0.0–0.1)
Basophils Relative: 0.8 % (ref 0.0–3.0)
Eosinophils Absolute: 0.8 10*3/uL — ABNORMAL HIGH (ref 0.0–0.7)
Eosinophils Relative: 6.7 % — ABNORMAL HIGH (ref 0.0–5.0)
HCT: 44.6 % (ref 39.0–52.0)
Hemoglobin: 15.2 g/dL (ref 13.0–17.0)
Lymphocytes Relative: 20.4 % (ref 12.0–46.0)
Lymphs Abs: 2.5 10*3/uL (ref 0.7–4.0)
MCHC: 34 g/dL (ref 30.0–36.0)
MCV: 96.2 fl (ref 78.0–100.0)
Monocytes Absolute: 0.6 10*3/uL (ref 0.1–1.0)
Monocytes Relative: 4.7 % (ref 3.0–12.0)
Neutro Abs: 8.4 10*3/uL — ABNORMAL HIGH (ref 1.4–7.7)
Neutrophils Relative %: 67.4 % (ref 43.0–77.0)
Platelets: 199 10*3/uL (ref 150.0–400.0)
RBC: 4.64 Mil/uL (ref 4.22–5.81)
RDW: 15.2 % (ref 11.5–15.5)
WBC: 12.4 10*3/uL — ABNORMAL HIGH (ref 4.0–10.5)

## 2017-07-31 LAB — IRON,TIBC AND FERRITIN PANEL
%SAT: 22 % (calc) (ref 15–60)
Ferritin: 72 ng/mL (ref 20–380)
Iron: 69 ug/dL (ref 50–180)
TIBC: 310 mcg/dL (calc) (ref 250–425)

## 2017-08-01 ENCOUNTER — Other Ambulatory Visit: Payer: Self-pay

## 2017-08-01 DIAGNOSIS — I8290 Acute embolism and thrombosis of unspecified vein: Secondary | ICD-10-CM

## 2017-08-06 MED FILL — WARFARIN SODIUM 6 MG TABLET: 6 | 20 days supply | Qty: 20 | Fill #4

## 2017-08-08 ENCOUNTER — Other Ambulatory Visit: Payer: Self-pay | Admitting: Family Medicine

## 2017-08-19 NOTE — Progress Notes (Addendum)
[name] is a 52 y.o. male is here for follow up.  History of Present Illness:   [name], CMA acting as scribe for Dr. Briscoe Deutscher.   HPI: See Assessment and Plan section for Problem Based Charting of issues discussed today.   Depression screen [facility] 2/9 07/08/2016 07/05/2016 03/08/2015  Decreased Interest 0 1 0  Down, Depressed, Hopeless 0 0 0  PHQ - 2 Score 0 1 0   PMHx, SurgHx, SocialHx, FamHx, Medications, and Allergies were reviewed in the Visit Navigator and updated as appropriate.   Patient Active Problem List   Diagnosis Date Noted  . Leukocytosis 06/18/2017  . Low testosterone in male 05/04/2017  . Erectile dysfunction 11/19/2016  . Chronic right-sided low back pain with right-sided sciatica 11/19/2016  . Internal hemorrhoid 11/19/2016  . 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 Arthritis of left acromioclavicular joint 09/29/2016  . Monitoring for anticoagulant use 09/15/2016  . Long term (current) use of anticoagulants [Z79.01] 09/15/2016  . Primary hypercoagulable state ([name]) [D68.59] 09/15/2016  . History of alcohol abuse 07/11/2016  . Chronic left shoulder pain 07/08/2016  . Iron deficiency anemia due to chronic blood loss   . Gastroesophageal reflux disease   . Dyslipidemia 06/27/2016  . Antiphospholipid antibody positive 06/27/2016  . Shortness of breath 06/02/2016  . Thrombus 06/02/2016  . PAD (peripheral artery disease) ([place]) 06/02/2016  . Claudication ([place]) 06/02/2016  . 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 Atherosclerosis of native arteries of extremity with intermittent claudication ([place]) 02/24/2016  . Weakness of right leg 01/11/2016  . Piriformis syndrome of right side 12/29/2015  . Leg length discrepancy 12/29/2015  . Anxiety with depression 06/24/2014  . Nicotine dependence 06/24/2014   Social History   Tobacco Use  . Smoking status: Current Every Day Smoker    Packs/day: 1.00    Years: 33.00    Pack years: 33.00   Types: Cigarettes, E-cigarettes  . Smokeless tobacco: Never Used  Substance Use Topics  . Alcohol use: No    Comment: per pt recovering alcoholic in remission since 04-25-2016  . Drug use: No   Current Medications and Allergies:    .  albuterol (PROVENTIL HFA;VENTOLIN HFA) 108 (90 Base) MCG/ACT inhaler, Inhale 2 puffs into the lungs every 6 (six) hours as needed for wheezing or shortness of breath., Disp: 1 Inhaler, Rfl: 2 .  esomeprazole (NEXIUM) 20 MG capsule, Take 20 mg by mouth every morning. , Disp: , Rfl:  .  oxyCODONE-acetaminophen (PERCOCET) 10-325 MG tablet, Take 1 tablet by mouth every 8 (eight) hours as needed for pain., Disp: 90 tablet, Rfl: 0 .  propranolol (INDERAL) 10 MG tablet, Take 1 tablet (10 mg total) by mouth 3 (three) times daily., Disp: 30 tablet, Rfl: 2 .  rosuvastatin (CRESTOR) 5 MG tablet, Take 1 tablet (5 mg total) by mouth daily., Disp: 30 tablet, Rfl: 3 .  sildenafil (VIAGRA) 25 MG tablet, 2-3 tabs po x 1. Take 30 minutes to 4 hours prior to sexual activity., Disp: 21 tablet, Rfl: 2 .  varenicline (CHANTIX CONTINUING MONTH PAK) 1 MG tablet, Take 1 tablet (1 mg total) by mouth 2 (two) times daily., Disp: 90 tablet, Rfl: 0 .  venlafaxine XR (EFFEXOR XR) 37.5 MG 24 hr capsule, Take 1 capsule (37.5 mg total) daily with breakfast by mouth., Disp: 90 capsule, Rfl: 1 .  warfarin (COUMADIN) 1 MG tablet, Take 1 tablet (1 mg total) daily by mouth., Disp: 30 tablet, Rfl: 0 .  warfarin (COUMADIN) 6 MG  tablet, TAKE 1 TABLET BY MOUTH ONCE DAILY, Disp: 35 tablet, Rfl: 3 .  warfarin (COUMADIN) 6 MG tablet, TAKE 1 TABLET BY MOUTH ONCE DAILY, Disp: 35 tablet, Rfl: 3  Allergies  Allergen Reactions  . Cymbalta [Duloxetine Hcl] Other (See Comments)    Headaches   Review of Systems   Pertinent items are noted in the HPI. Otherwise, ROS is negative.  Vitals:   Vitals:   08/20/17 1023  BP: 118/62  Pulse: 81  Temp: 98.7 F (37.1 C)  TempSrc: Oral  SpO2: 97%  Weight: 176 lb  9.6 oz (80.1 kg)  Height: 5\' 9"  (1.753 m)     Body mass index is 26.08 kg/m.   Physical Exam:   Physical Exam  Constitutional: He is oriented to person, place, and time. He appears well-developed and well-nourished. No distress.  HENT:  Head: Normocephalic and atraumatic.  Right Ear: External ear normal.  Left Ear: External ear normal.  Nose: Nose normal.  Eyes: Pupils are equal, round, and reactive to light. Conjunctivae and EOM are normal.  Neck: Normal range of motion. Neck supple.  Cardiovascular: Normal rate, regular rhythm and intact distal pulses.  Pulmonary/Chest: Effort normal and breath sounds normal.  Neurological: He is alert and oriented to person, place, and time.  Skin: Skin is warm and dry.  Psychiatric: He has a normal mood and affect. His behavior is normal. Judgment and thought content normal.  Nursing note and vitals reviewed.   Results for orders placed or performed in visit on 08/20/17  CBC with Differential/Platelet  Result Value Ref Range   WBC 13.4 (H) 4.0 - 10.5 K/uL   RBC 4.67 4.22 - 5.81 Mil/uL   Hemoglobin 15.3 13.0 - 17.0 g/dL   HCT 45.7 39.0 - 52.0 %   MCV 97.9 78.0 - 100.0 fl   MCHC 33.6 30.0 - 36.0 g/dL   RDW 14.7 11.5 - 15.5 %   Platelets 243.0 150.0 - 400.0 K/uL   Neutrophils Relative % 69.6 43.0 - 77.0 %   Lymphocytes Relative 19.3 12.0 - 46.0 %   Monocytes Relative 4.9 3.0 - 12.0 %   Eosinophils Relative 5.3 (H) 0.0 - 5.0 %   Basophils Relative 0.9 0.0 - 3.0 %   Neutro Abs 9.3 (H) 1.4 - 7.7 K/uL   Lymphs Abs 2.6 0.7 - 4.0 K/uL   Monocytes Absolute 0.7 0.1 - 1.0 K/uL   Eosinophils Absolute 0.7 0.0 - 0.7 K/uL   Basophils Absolute 0.1 0.0 - 0.1 K/uL   Assessment and Plan:   [name] was seen today for follow-up.  Diagnoses and all orders for this visit:  Cigarette nicotine dependence without complication Comments: Patient has been taking Chantix without cessation of smoking.  Will DC medication for the moment due to insurance  purposes.  Iron deficiency anemia due to chronic blood loss Comments: Resolved at this point.  Will continue to monitor but also be optimistic that he is now more stable.  The capsule study has not been completed yet due to it being unavailable at this time.  I do not see a reason to push at this time.  CBC Latest Ref Rng & Units 08/20/2017 07/30/2017 06/18/2017  WBC 4.0 - 10.5 K/uL 13.4(H) 12.4(H) 15.8(H)  Hemoglobin 13.0 - 17.0 g/dL 15.3 15.2 17.7(H)  Hematocrit 39.0 - 52.0 % 45.7 44.6 52.2(H)  Platelets 150.0 - 400.0 K/uL 243.0 199.0 238.0   Lab Results  Component Value Date   IRON 79 08/20/2017   TIBC 323  08/20/2017   FERRITIN 111 08/20/2017   Orders: -     Iron, TIBC and Ferritin Panel -     CBC with Differential/Platelet  Primary hypercoagulable state ([place]) [D68.59] Comments:  Stable INRs.  No concerns or changes today.  Lab Results  Component Value Date   INR 2.7 07/30/2017   INR 2.4 06/18/2017   INR 2.9 05/23/2017   Orders: -     warfarin (COUMADIN) 6 MG tablet; Take 1 tablet (6 mg total) by mouth daily.  Chronic right-sided low back pain with right-sided sciatica, with right severe foot pain Comments: Worsening. Most recent vascular testing without concerns. Ready for referral to Neurosurgery. Will speak with Dr. Tamala Julian about this.   MRI: 1. Shallow right foraminal/extraforaminal disc protrusion at L4-5 without direct neural compression but potential irritation the right L4 nerve root. 2. Shallow central and left paracentral disc protrusion at L5-S1 without definite neural compression.  Orders: -     oxyCODONE-acetaminophen (PERCOCET) 10-325 MG tablet; Take 1 tablet by mouth every 8 (eight) hours as needed for pain. -     oxyCODONE-acetaminophen (PERCOCET) 10-325 MG tablet; Take 1 tablet by mouth every 8 (eight) hours as needed for pain. -     oxyCODONE-acetaminophen (PERCOCET) 10-325 MG tablet; Take 1 tablet by mouth every 8 (eight) hours as needed for  pain.  Leukocytosis, unspecified type Comments: Stable trend.  Borderline.  Already discussed with hematology.  Stated not concerned due to being reactive.  Pulmonary emphysema, unspecified emphysema type ([place]) Comments: Identified on most recent CT scan.  Patient is still smoking but is more motivated to quit at this point.  Incidental lung nodule, less than or equal to 38mm Comments: Low risk nodule in a high risk patient.  Will repeat CT scan and 6-12 months.  Anxiety with depression Comments: Ongoing.  On no chronic medication at this time.  Previous reaction to other medications including Cymbalta.  Skin picking habit Comments: Skin picking habit seems to be related to anxiety.  We did discuss ways to minimize his risk of continuing.  . Reviewed expectations re: course of current medical issues. . Discussed self-management of symptoms. . Outlined signs and symptoms indicating need for more acute intervention. . Patient verbalized understanding and all questions were answered. Marland Kitchen Health Maintenance issues including appropriate healthy diet, exercise, and smoking avoidance were discussed with patient. . See orders for this visit as documented in the electronic medical record. . Patient received an After Visit Summary.  Briscoe Deutscher, DO [place], [place] 08/22/2017  Future Appointments  Date Time Provider [place]  09/06/2017 11:15 AM Lyndal Pulley, DO [facility]  09/10/2017  4:30 PM [facility]

## 2017-08-20 ENCOUNTER — Ambulatory Visit (INDEPENDENT_AMBULATORY_CARE_PROVIDER_SITE_OTHER): Payer: 59 | Admitting: Family Medicine

## 2017-08-20 ENCOUNTER — Encounter: Payer: Self-pay | Admitting: Family Medicine

## 2017-08-20 VITALS — BP 118/62 | HR 81 | Temp 98.7°F | Ht 69.0 in | Wt 176.6 lb

## 2017-08-20 DIAGNOSIS — D500 Iron deficiency anemia secondary to blood loss (chronic): Secondary | ICD-10-CM | POA: Diagnosis not present

## 2017-08-20 DIAGNOSIS — R911 Solitary pulmonary nodule: Secondary | ICD-10-CM

## 2017-08-20 DIAGNOSIS — M5441 Lumbago with sciatica, right side: Secondary | ICD-10-CM | POA: Diagnosis not present

## 2017-08-20 DIAGNOSIS — G8929 Other chronic pain: Secondary | ICD-10-CM

## 2017-08-20 DIAGNOSIS — D72829 Elevated white blood cell count, unspecified: Secondary | ICD-10-CM | POA: Diagnosis not present

## 2017-08-20 DIAGNOSIS — D6859 Other primary thrombophilia: Secondary | ICD-10-CM | POA: Diagnosis not present

## 2017-08-20 DIAGNOSIS — F418 Other specified anxiety disorders: Secondary | ICD-10-CM

## 2017-08-20 DIAGNOSIS — F424 Excoriation (skin-picking) disorder: Secondary | ICD-10-CM | POA: Diagnosis not present

## 2017-08-20 DIAGNOSIS — J439 Emphysema, unspecified: Secondary | ICD-10-CM

## 2017-08-20 DIAGNOSIS — F17210 Nicotine dependence, cigarettes, uncomplicated: Secondary | ICD-10-CM

## 2017-08-20 LAB — CBC WITH DIFFERENTIAL/PLATELET
Basophils Absolute: 0.1 10*3/uL (ref 0.0–0.1)
Basophils Relative: 0.9 % (ref 0.0–3.0)
Eosinophils Absolute: 0.7 10*3/uL (ref 0.0–0.7)
Eosinophils Relative: 5.3 % — ABNORMAL HIGH (ref 0.0–5.0)
HCT: 45.7 % (ref 39.0–52.0)
Hemoglobin: 15.3 g/dL (ref 13.0–17.0)
Lymphocytes Relative: 19.3 % (ref 12.0–46.0)
Lymphs Abs: 2.6 10*3/uL (ref 0.7–4.0)
MCHC: 33.6 g/dL (ref 30.0–36.0)
MCV: 97.9 fl (ref 78.0–100.0)
Monocytes Absolute: 0.7 10*3/uL (ref 0.1–1.0)
Monocytes Relative: 4.9 % (ref 3.0–12.0)
Neutro Abs: 9.3 10*3/uL — ABNORMAL HIGH (ref 1.4–7.7)
Neutrophils Relative %: 69.6 % (ref 43.0–77.0)
Platelets: 243 10*3/uL (ref 150.0–400.0)
RBC: 4.67 Mil/uL (ref 4.22–5.81)
RDW: 14.7 % (ref 11.5–15.5)
WBC: 13.4 10*3/uL — ABNORMAL HIGH (ref 4.0–10.5)

## 2017-08-20 MED ORDER — OXYCODONE-ACETAMINOPHEN 10-325 MG PO TABS: 1.0000 | ORAL_TABLET | Freq: Three times a day (TID) | ORAL | 0 refills | Status: DC | PRN

## 2017-08-20 MED ORDER — WARFARIN SODIUM 6 MG PO TABS: 6.0000 mg | ORAL_TABLET | Freq: Every day | ORAL | 3 refills | Status: DC

## 2017-08-21 LAB — IRON,TIBC AND FERRITIN PANEL
%SAT: 24 % (calc) (ref 15–60)
Ferritin: 111 ng/mL (ref 20–380)
Iron: 79 ug/dL (ref 50–180)
TIBC: 323 mcg/dL (calc) (ref 250–425)

## 2017-08-21 MED FILL — ROSUVASTATIN CALCIUM 5 MG T: 5 | 30 days supply | Qty: 30 | Fill #2

## 2017-08-22 ENCOUNTER — Encounter: Payer: Self-pay | Admitting: Family Medicine

## 2017-08-22 MED FILL — WARFARIN SODIUM 6 MG TABLET: 6 | 30 days supply | Qty: 30 | Fill #0

## 2017-08-22 MED FILL — PROPRANOLOL 10 MG TABLET: 10 | 10 days supply | Qty: 30 | Fill #1

## 2017-08-25 MED FILL — OXYCODONE-APAP 10-325: 10-325 | 30 days supply | Qty: 90 | Fill #0

## 2017-08-27 MED FILL — GABAPENTIN 600 MG TABLET: 600 | 30 days supply | Qty: 90 | Fill #3

## 2017-09-06 ENCOUNTER — Ambulatory Visit: Payer: Self-pay

## 2017-09-06 ENCOUNTER — Encounter: Payer: Self-pay | Admitting: Family Medicine

## 2017-09-06 ENCOUNTER — Ambulatory Visit (INDEPENDENT_AMBULATORY_CARE_PROVIDER_SITE_OTHER): Payer: 59 | Admitting: Family Medicine

## 2017-09-06 VITALS — BP 122/80 | HR 84 | Ht 69.0 in

## 2017-09-06 DIAGNOSIS — G8929 Other chronic pain: Secondary | ICD-10-CM

## 2017-09-06 DIAGNOSIS — M1120 Other chondrocalcinosis, unspecified site: Secondary | ICD-10-CM

## 2017-09-06 DIAGNOSIS — M1711 Unilateral primary osteoarthritis, right knee: Secondary | ICD-10-CM | POA: Diagnosis not present

## 2017-09-06 DIAGNOSIS — M25561 Pain in right knee: Secondary | ICD-10-CM

## 2017-09-06 DIAGNOSIS — M999 Biomechanical lesion, unspecified: Secondary | ICD-10-CM | POA: Diagnosis not present

## 2017-09-06 DIAGNOSIS — M5441 Lumbago with sciatica, right side: Secondary | ICD-10-CM

## 2017-09-06 NOTE — Assessment & Plan Note (Signed)
Decision today to treat with OMT was based on Physical Exam  After verbal consent patient was treated with HVLA, ME, FPR techniques in cervical, thoracic, lumbar and sacral areas  Patient tolerated the procedure well with improvement in symptoms  Patient given exercises, stretches and lifestyle modifications  See medications in patient instructions if given  Patient will follow up in 4 weeks 

## 2017-09-06 NOTE — Patient Instructions (Addendum)
Good to see you  [name] is your friend.  Exercises 3 times a week.  Compression sleeve for the knee  Tart cherry extract any dose at night  See me again in 4-6 weeks

## 2017-09-06 NOTE — Assessment & Plan Note (Signed)
Discussed over the counter medications. 

## 2017-09-06 NOTE — Assessment & Plan Note (Signed)
Mild medial compartment.

## 2017-09-06 NOTE — Assessment & Plan Note (Signed)
Has known L4 nerve root.  Patient's has had difficulty previously.  Patient has had injections and has responded to them previously.  Patient may need another injection.  Attempted osteopathic manipulation.  Home exercises given.  Discussed icing regimen.  Patient will come back and see me again in 4 weeks

## 2017-09-06 NOTE — Progress Notes (Signed)
[name] [name], [place] [address] Phone: [phone] Subjective:    I'm seeing this patient by the request  of:    CC: Knee pain, back pain  ION:[id number]  [name] is a 52 y.o. male coming in with complaint of   Right knee pain.  States that it is off and on.  States that if he does a lot of walking seems to get a little bit worse.  Patient does not know if it is coming from the knee or his back.  Sometimes has swelling intermittently.  No new injury.  Seen by me previously and was found to have what appeared to be a loose body as well as meniscal injury.   Right knee does show significant osteoarthritic changes as well as a meniscal injury.  Patient is also had back pain.  Has known L4 nerve roots impingement.  Has had injections.  Patient is concerned because he has had 3 over the course of the last 12 months.  Wanting to know what else he can potentially do.  Patient has not tried anything specific at this time.  Patient has been dealing with a lot of other health issues but seems to be making progress.  Patient wants to be more active but finds it difficult because he has radicular symptoms anytime he walks long distances.  Patient does have an MRI of the back showing a disc protrusion at L4-L5 with an L4 nerve root impingement on the right side.  Patient has had III nerve root injection last one December 2018  Past Medical History:  Diagnosis Date  . Anticoagulated on Coumadin   . Antiphospholipid antibody positive 06/27/2016   followed by dr kale ([facility])  . Anxiety with depression 06/24/2014  . Chronic back pain   . ED (erectile dysfunction)   . GERD (gastroesophageal reflux disease)   . Hemorrhoids, internal, with bleeding   . Hiatal hernia   . History of adenomatous polyp of colon    07-08-2016  tubular adenoma's  . History of atrial fibrillation    episode 04/ 2018  . History of DVT of lower extremity   .  Hyperlipidemia   . Iron deficiency anemia due to chronic blood loss followed by dr Irene Limbo ([facility])   from hemorrhoids and on coumadin-- hx Iron infusion's and blood transfusion's x2 07-09-2016  . Neuropathy, peripheral    right foot numbness from lumbar pinched nerve  . OA (osteoarthritis)   . PAD (peripheral artery disease) ([place]) followed by dr Bridgett Larsson--  per LOV note ABI 11-14-2016  widely patent stent/  11-27-2016 per pt no claudication symptoms   hx right CIA angioplasty and stent 02-24-2016/  right CIA thromectomy and patch angioplasty for restenosis 07-18-2015  . Primary hypercoagulable state ([place]) [D68.59] 09/15/2016  . Recovering alcoholic in remission ([place])    per pt since 04-25-2016  . S/P insertion of iliac artery stent    02-23-2017  right CIA PTA and stent/  05-11-2016  in-stent restenosis  s/p  thrombectomy/  07-17-2016 thrombectomy and  patch angioplasty right femoral artery   . Smokers' cough ([place])   . Wears glasses    Past Surgical History:  Procedure Laterality Date  . ANGIOPLASTY ILLIAC ARTERY Right 07/17/2016   Procedure: ANGIOPLASTY RIGHT COMMON ILIAC ARTERY;  Surgeon: Conrad Darlington, MD;  Location: [facility];  Service: Vascular;  Laterality: Right;  . ANTERIOR CERVICAL DECOMP/DISCECTOMY FUSION  07/21/2010   C5 --  C7  . CARDIOVASCULAR STRESS TEST  06-14-2016   dr hilty   normal perfusion study w/ no reversible ischemia/  stress ef 44% but visually looks better , echo ordered (LVEF 30-44%)  , normal LV wall motion   . COLONOSCOPY N/A 07/08/2016   Procedure: COLONOSCOPY;  Surgeon: Clarene Essex, MD;  Location: [facility];  Service: Endoscopy;  Laterality: N/A;  . ESOPHAGOGASTRODUODENOSCOPY N/A 07/08/2016   Procedure: ESOPHAGOGASTRODUODENOSCOPY (EGD);  Surgeon: Clarene Essex, MD;  Location: [facility];  Service: Endoscopy;  Laterality: N/A;  . GROIN DEBRIDEMENT Right 07/25/2016   Procedure: EVACUATION HEMATOMA RIGHT GROIN;  Surgeon: Waynetta Sandy, MD;  Location: [facility];  Service: Vascular;  Laterality: Right;  . HEMORRHOID SURGERY  2013  approx.   ?lanced/ banding  . HEMORRHOID SURGERY N/A 12/07/2016   Procedure: 3 COLUMN HEMORRHOIDECTOMY;  Surgeon: Leighton Ruff, MD;  Location: [facility];  Service: General;  Laterality: N/A;  . INTRAOPERATIVE ARTERIOGRAM Right 07/17/2016   Procedure: INTRA OPERATIVE ANGIOGRAM OF RIGHT COMMON ILIAC ARTERY;  Surgeon: Conrad [place], MD;  Location: [facility];  Service: Vascular;  Laterality: Right;  . KNEE SURGERY Right 1983  . PATCH ANGIOPLASTY Right 07/17/2016   Procedure: PATCH ANGIOPLASTY RIGHT FEMORAL ARTERY USING Rueben Bash BIOLOGIC PATCH;  Surgeon: Conrad Campbell, MD;  Location: [facility];  Service: Vascular;  Laterality: Right;  . PERIPHERAL VASCULAR CATHETERIZATION N/A 02/24/2016   Procedure: Abdominal Aortogram w/Lower Extremity;  Surgeon: Conrad Shonto, MD;  Location: [facility];  Service: Cardiovascular;  Laterality: N/A;  . PERIPHERAL VASCULAR CATHETERIZATION Right 02/24/2016   Procedure: Peripheral Vascular Intervention;  Surgeon: Conrad [place], MD;  Location: [facility];  Service: Cardiovascular;  Laterality: Right;  Common iliac  . PERIPHERAL VASCULAR CATHETERIZATION N/A 05/11/2016   Procedure: Abdominal Aortogram;  Surgeon: Conrad Advance, MD;  Location: [facility];  Service: Cardiovascular;  Laterality: N/A;  . PERIPHERAL VASCULAR CATHETERIZATION N/A 05/11/2016   Procedure: Lower Extremity Angiography;  Surgeon: Conrad Mansfield, MD;  Location: [facility];  Service: Cardiovascular;  Laterality: N/A;  . THROMBECTOMY FEMORAL ARTERY Right 07/17/2016   Procedure: THROMBECTOMY RIGHT FEMORAL ARTERY;  Surgeon: Conrad [place], MD;  Location: [facility];  Service: Vascular;  Laterality: Right;  . TRANSTHORACIC ECHOCARDIOGRAM  06-20-2016   dr hilty   ef 55-60%,  grade 1 diastolic dysfunction/  mild TR   Social History   Socioeconomic History  . Marital status: Married    Spouse name: Not on file    . Number of children: Not on file  . Years of education: Not on file  . Highest education level: Not on file  Occupational History  . Not on file  Social Needs  . Financial resource strain: Not on file  . Food insecurity:    Worry: Not on file    Inability: Not on file  . Transportation needs:    Medical: Not on file    Non-medical: Not on file  Tobacco Use  . Smoking status: Current Every Day Smoker    Packs/day: 1.00    Years: 33.00    Pack years: 33.00    Types: Cigarettes, E-cigarettes  . Smokeless tobacco: Never Used  Substance and Sexual Activity  . Alcohol use: No    Comment: per pt recovering alcoholic in remission since 04-25-2016  . Drug use: No  . Sexual activity: Yes  Lifestyle  . Physical activity:    Days per week: Not on  file    Minutes per session: Not on file  . Stress: Not on file  Relationships  . Social connections:    Talks on phone: Not on file    Gets together: Not on file    Attends religious service: Not on file    Active member of club or organization: Not on file    Attends meetings of clubs or organizations: Not on file    Relationship status: Not on file  Other Topics Concern  . Not on file  Social History Narrative  . Not on file   Allergies  Allergen Reactions  . Cymbalta [Duloxetine Hcl] Other (See Comments)    Headaches   Family History  Problem Relation Age of Onset  . Cancer Father        Lung     Past medical history, social, surgical and family history all reviewed in electronic medical record.  No pertanent information unless stated regarding to the chief complaint.   Review of Systems:Review of systems updated and as accurate as of 09/06/17  No headache, visual changes, nausea, vomiting, diarrhea, constipation, dizziness, abdominal pain, skin rash, fevers, chills, night sweats, weight loss, swollen lymph nodes, , chest pain, shortness of breath, mood changes.  Positive muscle aches, body aches, joint  swelling  Objective  Blood pressure 122/80, pulse 84, height 5\' 9"  (1.753 m), SpO2 98 %. Systems examined below as of 09/06/17   General: No apparent distress alert and oriented x3 mood and affect normal, dressed appropriately.  HEENT: Pupils equal, extraocular movements intact  Respiratory: Patient's speak in full sentences and does not appear short of breath  Cardiovascular: No lower extremity edema, non tender, no erythema  Skin: Warm dry intact with no signs of infection or rash on extremities or on axial skeleton.  Abdomen: Soft nontender  Neuro: Cranial nerves II through XII are intact, neurovascularly intact in all extremities with 2+ DTRs and 2+ pulses.  Lymph: No lymphadenopathy of posterior or anterior cervical chain or axillae bilaterally.  Gait normal with good balance and coordination.  MSK:  Non tender with full range of motion and good stability and symmetric strength and tone of shoulders, elbows, wrist, hip, and ankles bilaterally.   Knee: Right Normal to inspection with no erythema or effusion or obvious bony abnormalities. Tenderness over the medial joint line ROM full in flexion and extension and lower leg rotation. Ligaments with solid consistent endpoints including ACL, PCL, LCL, MCL. Positive Mcmurray's, Apley's, and Thessalonian tests. Mild painful patellar compression. Patellar glide with mild crepitus. Patellar and quadriceps tendons unremarkable. Hamstring and quadriceps strength is normal. Contralateral knee mild tenderness but no radicular symptoms and no instability  Back exam shows the patient does have a loss of lordosis.  Patient has a negative straight leg test today.  Severe tenderness to palpation over the paraspinal musculature lumbar spine mostly in the L4-L5 distribution.  Right hip shows mild decreased range of motion in all planes.  Osteopathic findings C6 flexed rotated and side bent left T5 extended rotated and side bent left L5 flexed  rotated and side bent right Sacrum right on right     Impression and Recommendations:     This case required medical decision making of moderate complexity.      Note: This dictation was prepared with Dragon dictation along with smaller phrase technology. Any transcriptional errors that result from this process are unintentional.

## 2017-09-09 MED FILL — PROPRANOLOL 10 MG TABLET: 10 | 10 days supply | Qty: 30 | Fill #2

## 2017-09-10 ENCOUNTER — Ambulatory Visit (INDEPENDENT_AMBULATORY_CARE_PROVIDER_SITE_OTHER): Payer: 59 | Admitting: General Practice

## 2017-09-10 DIAGNOSIS — Z7901 Long term (current) use of anticoagulants: Secondary | ICD-10-CM | POA: Diagnosis not present

## 2017-09-10 DIAGNOSIS — D6859 Other primary thrombophilia: Secondary | ICD-10-CM

## 2017-09-10 LAB — POCT INR: INR: 2.2

## 2017-09-10 MED FILL — SILDENAFIL CITRATE 25 MG TA: 25 | 7 days supply | Qty: 21 | Fill #2

## 2017-09-10 NOTE — Patient Instructions (Addendum)
Pre visit review using our clinic review tool, if applicable. No additional management support is needed unless otherwise documented below in the visit note.  Take 9 mg today and then continue to take 6 mg daily except 7 mg on Tuesdays. Re-check in 6 weeks.

## 2017-09-16 ENCOUNTER — Encounter: Payer: Self-pay | Admitting: Family Medicine

## 2017-09-17 ENCOUNTER — Other Ambulatory Visit: Payer: Self-pay

## 2017-09-17 ENCOUNTER — Other Ambulatory Visit: Payer: Self-pay | Admitting: Surgical

## 2017-09-17 DIAGNOSIS — I739 Peripheral vascular disease, unspecified: Secondary | ICD-10-CM

## 2017-09-17 DIAGNOSIS — F419 Anxiety disorder, unspecified: Secondary | ICD-10-CM

## 2017-09-17 DIAGNOSIS — I70211 Atherosclerosis of native arteries of extremities with intermittent claudication, right leg: Secondary | ICD-10-CM

## 2017-09-17 DIAGNOSIS — I745 Embolism and thrombosis of iliac artery: Secondary | ICD-10-CM

## 2017-09-17 MED ORDER — PROPRANOLOL HCL 10 MG PO TABS: 10.0000 mg | ORAL_TABLET | Freq: Three times a day (TID) | ORAL | 2 refills | Status: DC

## 2017-09-17 MED FILL — ROSUVASTATIN CALCIUM 5 MG T: 5 | 30 days supply | Qty: 30 | Fill #3

## 2017-09-17 MED FILL — PROPRANOLOL 10 MG TABLET: 10 | 30 days supply | Qty: 90 | Fill #0

## 2017-09-21 ENCOUNTER — Other Ambulatory Visit: Payer: Self-pay | Admitting: *Deleted

## 2017-09-21 DIAGNOSIS — M5441 Lumbago with sciatica, right side: Principal | ICD-10-CM

## 2017-09-21 DIAGNOSIS — G8929 Other chronic pain: Secondary | ICD-10-CM

## 2017-09-21 MED ORDER — PREDNISONE 50 MG PO TABS: ORAL_TABLET | ORAL | 0 refills | Status: DC

## 2017-09-21 MED FILL — OXYCODONE-APAP 10-325: 10-325 | 30 days supply | Qty: 90 | Fill #0

## 2017-09-21 MED FILL — predniSONE 50 MG TABS: 50 | 5 days supply | Qty: 5 | Fill #0

## 2017-09-24 ENCOUNTER — Other Ambulatory Visit: Payer: Self-pay | Admitting: General Practice

## 2017-09-24 ENCOUNTER — Telehealth: Payer: Self-pay | Admitting: General Practice

## 2017-09-24 MED ORDER — ENOXAPARIN SODIUM 120 MG/0.8ML ~~LOC~~ SOLN: 120.0000 mg | SUBCUTANEOUS | 0 refills | Status: DC

## 2017-09-24 MED FILL — WARFARIN SODIUM 6 MG TABLET: 6 | 30 days supply | Qty: 30 | Fill #1

## 2017-09-24 MED FILL — ENOXAPARIN 120 MG/0.8 ML SY: 120 | 7 days supply | Qty: 6 | Fill #0

## 2017-09-24 NOTE — Telephone Encounter (Signed)
Coumadin and Lovenox bridge for procedure on 5/15.  5/10 - Last dose of coumadin until after procedure 5/11 - Nothing (No coumadin or Lovenox) 5/12 - Lovenox in the AM 5/13 - Lovenox in the AM 5/14 - Lovenox in the AM (Please take by 7 am) 5/15 - Procedure (NO LOVENOX TODAY) 5/16 - Lovenox in the AM and 1 1/2 tablets of coumadin 5/17 - Lovenox in the AM and 1 1/2 tablets of coumadin 5/18 - Lovenox in the AM and 1 1/2 tablets of coumadin 5/19 - Lovenox in the AM and 1 tablet of coumadin 5/20 - Check INR at [facility] at 4:30

## 2017-09-24 NOTE — Telephone Encounter (Signed)
-----   Message from Briscoe Deutscher, DO sent at 09/24/2017 11:20 AM EDT ----- Regarding: RE: Lovenox bridge Yes. Thanks! EW ----- Message ----- From: Warden Fillers, RN Sent: 09/24/2017  11:18 AM To: Briscoe Deutscher, DO Subject: Lovenox bridge                                 Good morning!  Patient has a spinal injection planned for 5/15.  He will need to stop coumadin for 5 days and will need a Lovenox bridge.  Do I have your permission to dose and call in the Lovenox?  Thanks, [place] Herb, RN

## 2017-09-26 ENCOUNTER — Encounter: Payer: Self-pay | Admitting: Family Medicine

## 2017-09-28 ENCOUNTER — Other Ambulatory Visit: Payer: Self-pay

## 2017-09-28 DIAGNOSIS — E611 Iron deficiency: Secondary | ICD-10-CM

## 2017-09-28 NOTE — Progress Notes (Signed)
tibc

## 2017-10-02 ENCOUNTER — Ambulatory Visit (INDEPENDENT_AMBULATORY_CARE_PROVIDER_SITE_OTHER): Payer: 59 | Admitting: General Practice

## 2017-10-02 ENCOUNTER — Other Ambulatory Visit (INDEPENDENT_AMBULATORY_CARE_PROVIDER_SITE_OTHER): Payer: 59

## 2017-10-02 DIAGNOSIS — Z7901 Long term (current) use of anticoagulants: Secondary | ICD-10-CM

## 2017-10-02 DIAGNOSIS — I8290 Acute embolism and thrombosis of unspecified vein: Secondary | ICD-10-CM | POA: Diagnosis not present

## 2017-10-02 DIAGNOSIS — D6859 Other primary thrombophilia: Secondary | ICD-10-CM

## 2017-10-02 LAB — CBC WITH DIFFERENTIAL/PLATELET
Basophils Absolute: 0.1 10*3/uL (ref 0.0–0.1)
Basophils Relative: 0.8 % (ref 0.0–3.0)
Eosinophils Absolute: 0.8 10*3/uL — ABNORMAL HIGH (ref 0.0–0.7)
Eosinophils Relative: 6.2 % — ABNORMAL HIGH (ref 0.0–5.0)
HCT: 46.2 % (ref 39.0–52.0)
Hemoglobin: 15.9 g/dL (ref 13.0–17.0)
Lymphocytes Relative: 32 % (ref 12.0–46.0)
Lymphs Abs: 4.2 10*3/uL — ABNORMAL HIGH (ref 0.7–4.0)
MCHC: 34.3 g/dL (ref 30.0–36.0)
MCV: 98.7 fl (ref 78.0–100.0)
Monocytes Absolute: 0.8 10*3/uL (ref 0.1–1.0)
Monocytes Relative: 5.8 % (ref 3.0–12.0)
Neutro Abs: 7.2 10*3/uL (ref 1.4–7.7)
Neutrophils Relative %: 55.2 % (ref 43.0–77.0)
Platelets: 244 10*3/uL (ref 150.0–400.0)
RBC: 4.68 Mil/uL (ref 4.22–5.81)
RDW: 13 % (ref 11.5–15.5)
WBC: 13.1 10*3/uL — ABNORMAL HIGH (ref 4.0–10.5)

## 2017-10-02 LAB — POCT INR: INR: 1.3

## 2017-10-02 LAB — IBC PANEL
Iron: 77 ug/dL (ref 42–165)
Saturation Ratios: 21.2 % (ref 20.0–50.0)
Transferrin: 259 mg/dL (ref 212.0–360.0)

## 2017-10-02 LAB — IRON: Iron: 77 ug/dL (ref 42–165)

## 2017-10-02 NOTE — Patient Instructions (Signed)
Pre visit review using our clinic review tool, if applicable. No additional management support is needed unless otherwise documented below in the visit note.  Follow instructions. 5/10 - Last dose of coumadin until after procedure 5/11 - Nothing (No coumadin or Lovenox) 5/12 - Lovenox in the AM 5/13 - Lovenox in the AM 5/14 - Lovenox in the AM (Please take by 7 am) 5/15 - Procedure (NO LOVENOX TODAY) 5/16 - Lovenox in the AM and 1 1/2 tablets of coumadin 5/17 - Lovenox in the AM and 1 1/2 tablets of coumadin 5/18 - Lovenox in the AM and 1 1/2 tablets of coumadin 5/19 - Lovenox in the AM and 1 tablet of coumadin 5/20 - Check INR at [facility] at 4:30

## 2017-10-03 ENCOUNTER — Ambulatory Visit
Admission: RE | Admit: 2017-10-03 | Discharge: 2017-10-03 | Disposition: A | Discharge status: Home / Self Care | Payer: 59 | Source: Ambulatory Visit | Attending: Family Medicine | Admitting: Family Medicine

## 2017-10-03 DIAGNOSIS — M47817 Spondylosis without myelopathy or radiculopathy, lumbosacral region: Secondary | ICD-10-CM | POA: Diagnosis not present

## 2017-10-03 DIAGNOSIS — G8929 Other chronic pain: Secondary | ICD-10-CM

## 2017-10-03 DIAGNOSIS — M5441 Lumbago with sciatica, right side: Principal | ICD-10-CM

## 2017-10-03 MED ORDER — IOPAMIDOL (ISOVUE-M 200) INJECTION 41%
1.0000 mL | Freq: Once | INTRAMUSCULAR | Status: AC
  Administered 2017-10-03: 1 mL via EPIDURAL

## 2017-10-03 MED ORDER — METHYLPREDNISOLONE ACETATE 40 MG/ML INJ SUSP (RADIOLOG
120.0000 mg | Freq: Once | INTRAMUSCULAR | Status: AC
  Administered 2017-10-03: 120 mg via EPIDURAL

## 2017-10-03 NOTE — Discharge Instructions (Signed)

## 2017-10-07 ENCOUNTER — Encounter: Payer: Self-pay | Admitting: Family Medicine

## 2017-10-08 ENCOUNTER — Ambulatory Visit (INDEPENDENT_AMBULATORY_CARE_PROVIDER_SITE_OTHER): Payer: 59 | Admitting: General Practice

## 2017-10-08 ENCOUNTER — Other Ambulatory Visit: Payer: Self-pay | Admitting: Surgical

## 2017-10-08 DIAGNOSIS — Z7901 Long term (current) use of anticoagulants: Secondary | ICD-10-CM

## 2017-10-08 DIAGNOSIS — M5441 Lumbago with sciatica, right side: Principal | ICD-10-CM

## 2017-10-08 DIAGNOSIS — G8929 Other chronic pain: Secondary | ICD-10-CM

## 2017-10-08 DIAGNOSIS — D6859 Other primary thrombophilia: Secondary | ICD-10-CM

## 2017-10-08 LAB — POCT INR: INR: 2 (ref 2.0–3.0)

## 2017-10-08 MED ORDER — GABAPENTIN 600 MG PO TABS: 600.0000 mg | ORAL_TABLET | Freq: Three times a day (TID) | ORAL | 3 refills | Status: DC

## 2017-10-08 MED FILL — GABAPENTIN 600 MG TABLET: 600 | 90 days supply | Qty: 270 | Fill #0

## 2017-10-08 NOTE — Patient Instructions (Addendum)
Pre visit review using our clinic review tool, if applicable. No additional management support is needed unless otherwise documented below in the visit note.  Continue to take 6 mg daily except 7 mg on tuesdays.  Re-check in 6 weeks.  

## 2017-10-11 NOTE — Progress Notes (Signed)
Established Intermittent Claudication   History of Present Illness   [name] is a 52 y.o. (1965-10-09) male who presents with chief complaint: mild neuropathic pain in R leg   Work-up on spine reveals mild disc bulges with minimal impingement.  Patient has had epidural injections.    Prior procedures include: 1. TE R CIA, PTA R CIA (07/17/16) 2. R CIA PTA+S (02/24/16)  The patient's symptoms have NOT progressed.  The patient's symptoms are: mild R neuropathic pain.  No further intermittent claudication.  The patient's treatment regimen currently included: maximal medical management and anticoagulation for his APLS.  The patient's PMH, PSH, SH, and FamHx were reviewed on 10/17/17 are unchanged from 02/23/17.  Current Outpatient Medications  Medication Sig Dispense Refill  . albuterol (PROVENTIL HFA;VENTOLIN HFA) 108 (90 Base) MCG/ACT inhaler Inhale 2 puffs into the lungs every 6 (six) hours as needed for wheezing or shortness of breath. 1 Inhaler 2  . enoxaparin (LOVENOX) 120 MG/0.8ML injection Inject 0.8 mLs (120 mg total) into the skin daily. 7 Syringe 0  . esomeprazole (NEXIUM) 20 MG capsule Take 20 mg by mouth every morning.     . gabapentin (NEURONTIN) 600 MG tablet Take 1 tablet (600 mg total) by mouth 3 (three) times daily. 270 tablet 3  . oxyCODONE-acetaminophen (PERCOCET) 10-325 MG tablet Take 1 tablet by mouth every 8 (eight) hours as needed for pain. 90 tablet 0  . oxyCODONE-acetaminophen (PERCOCET) 10-325 MG tablet Take 1 tablet by mouth every 8 (eight) hours as needed for pain. 90 tablet 0  . oxyCODONE-acetaminophen (PERCOCET) 10-325 MG tablet Take 1 tablet by mouth every 8 (eight) hours as needed for pain. 90 tablet 0  . [START ON 10/23/2017] oxyCODONE-acetaminophen (PERCOCET) 10-325 MG tablet Take 1 tablet by mouth every 8 (eight) hours as needed for pain. 90 tablet 0  . predniSONE (DELTASONE) 50 MG tablet Take 1 tablet daily. 5 tablet 0  . propranolol (INDERAL) 10  MG tablet Take 1 tablet (10 mg total) by mouth 3 (three) times daily. 90 tablet 2  . rosuvastatin (CRESTOR) 5 MG tablet Take 1 tablet (5 mg total) by mouth daily. 30 tablet 3  . sildenafil (VIAGRA) 25 MG tablet 2-3 tabs po x 1. Take 30 minutes to 4 hours prior to sexual activity. 21 tablet 2  . varenicline (CHANTIX CONTINUING MONTH PAK) 1 MG tablet Take 1 tablet (1 mg total) by mouth 2 (two) times daily. 90 tablet 0  . warfarin (COUMADIN) 1 MG tablet Take 1 tablet (1 mg total) daily by mouth. 30 tablet 0  . warfarin (COUMADIN) 6 MG tablet Take 1 tablet (6 mg total) by mouth daily. 35 tablet 3   No current facility-administered medications for this visit.     On ROS today: mild neuropathic sx R foot, no intermittent claudication .   Physical Examination   Vitals:   10/17/17 0900  BP: 120/80  Pulse: (!) 57  Resp: 16  Temp: (!) 97.5 F (36.4 C)  SpO2: 100%  Weight: 175 lb (79.4 kg)  Height: 5\' 10"  (1.778 m)   Body mass index is 25.11 kg/m.  General Alert, O x 3, WD, NAD  Pulmonary Sym exp, good B air movt, CTA B  Cardiac RRR, Nl S1, S2, no Murmurs, No rubs, No S3,S4  Vascular Vessel Right Left  Radial Palpable Palpable  Brachial Palpable Palpable  Carotid Palpable, No Bruit Palpable, No Bruit  Aorta Not palpable N/A  Femoral Palpable Palpable  Popliteal Not palpable  Not palpable  PT Palpable Palpable  DP Palpable Palpable    Gastro- intestinal soft, non-distended, non-tender to palpation, No guarding or rebound, no HSM, no masses, no CVAT B, No palpable prominent aortic pulse,    Musculo- skeletal M/S 5/5 throughout  , Extremities without ischemic changes  , No edema present, No visible varicosities , No Lipodermatosclerosis present  Neurologic Pain and light touch intact in extremities , Motor exam as listed above    Non-Invasive Vascular imaging   ABI (10/17/2017)  R:   ABI: 1.29 (1.13),   PT: tri  DP: bi  TBI:  1.01  L:   ABI: 1.30 (1.16),   PT:  tri  DP: tri  TBI: 1.13  Aortoiliac Duplex (10/17/2017)  Ao: 96 c/s  R iliac: 117-153 c/s   Medical Decision Making   [name] is a 52 y.o. male who presents with:  right leg intermittent claudication without evidence of critical limb ischemia, APLS, HNP, s/p TE R CIA, PTA R CIA, R CIA PTA+S   Based on the patient's vascular studies and examination, I have offered the patient: annual BLE ABI and aortoiliac duplex.  I discussed in depth with the patient the nature of atherosclerosis, and emphasized the importance of maximal medical management including strict control of blood pressure, blood glucose, and lipid levels, antiplatelet agents, obtaining regular exercise, and cessation of smoking.    The patient is aware that without maximal medical management the underlying atherosclerotic disease process will progress, limiting the benefit of any interventions.  The patient is currently on a statin: Crestor.   The patient is currently not on an anti-platelet due to bleeding risks related to use of an anticoagulant.   Patient is on Warfarin for APLS  Thank you for allowing [place] to participate in this patient's care.   [name], MD, FACS [facility] Office: [phone] Pager: [phone]

## 2017-10-17 ENCOUNTER — Other Ambulatory Visit: Payer: Self-pay

## 2017-10-17 ENCOUNTER — Ambulatory Visit (INDEPENDENT_AMBULATORY_CARE_PROVIDER_SITE_OTHER): Payer: 59 | Admitting: Vascular Surgery

## 2017-10-17 ENCOUNTER — Ambulatory Visit (HOSPITAL_COMMUNITY)
Admission: RE | Admit: 2017-10-17 | Discharge: 2017-10-17 | Disposition: A | Discharge status: Home / Self Care | Payer: 59 | Source: Ambulatory Visit | Attending: Vascular Surgery | Admitting: Vascular Surgery

## 2017-10-17 ENCOUNTER — Encounter: Payer: Self-pay | Admitting: Vascular Surgery

## 2017-10-17 ENCOUNTER — Ambulatory Visit (INDEPENDENT_AMBULATORY_CARE_PROVIDER_SITE_OTHER)
Admission: RE | Admit: 2017-10-17 | Discharge: 2017-10-17 | Disposition: A | Discharge status: Home / Self Care | Payer: 59 | Source: Ambulatory Visit | Attending: Vascular Surgery | Admitting: Vascular Surgery

## 2017-10-17 VITALS — BP 120/80 | HR 57 | Temp 97.5°F | Resp 16 | Ht 70.0 in | Wt 175.0 lb

## 2017-10-17 DIAGNOSIS — F17200 Nicotine dependence, unspecified, uncomplicated: Secondary | ICD-10-CM | POA: Insufficient documentation

## 2017-10-17 DIAGNOSIS — R760 Raised antibody titer: Secondary | ICD-10-CM | POA: Diagnosis not present

## 2017-10-17 DIAGNOSIS — I70211 Atherosclerosis of native arteries of extremities with intermittent claudication, right leg: Secondary | ICD-10-CM

## 2017-10-17 DIAGNOSIS — R0989 Other specified symptoms and signs involving the circulatory and respiratory systems: Secondary | ICD-10-CM | POA: Diagnosis not present

## 2017-10-17 DIAGNOSIS — I739 Peripheral vascular disease, unspecified: Secondary | ICD-10-CM

## 2017-10-17 DIAGNOSIS — I745 Embolism and thrombosis of iliac artery: Secondary | ICD-10-CM

## 2017-10-21 ENCOUNTER — Encounter: Payer: Self-pay | Admitting: Family Medicine

## 2017-10-22 ENCOUNTER — Ambulatory Visit: Payer: 59

## 2017-10-22 ENCOUNTER — Other Ambulatory Visit: Payer: Self-pay | Admitting: Surgical

## 2017-10-22 DIAGNOSIS — E782 Mixed hyperlipidemia: Secondary | ICD-10-CM

## 2017-10-22 MED ORDER — ROSUVASTATIN CALCIUM 5 MG PO TABS: 5.0000 mg | ORAL_TABLET | Freq: Every day | ORAL | 2 refills | Status: DC

## 2017-10-22 MED FILL — PROPRANOLOL 10 MG TABLET: 10 | 30 days supply | Qty: 90 | Fill #1

## 2017-10-22 MED FILL — ROSUVASTATIN CALCIUM 5 MG T: 5 | 90 days supply | Qty: 90 | Fill #0

## 2017-10-23 MED FILL — OXYCODONE-APAP 10-325: 10-325 | 30 days supply | Qty: 90 | Fill #0

## 2017-10-28 MED FILL — WARFARIN SODIUM 6 MG TABLET: 6 | 30 days supply | Qty: 30 | Fill #2

## 2017-11-04 ENCOUNTER — Encounter: Payer: Self-pay | Admitting: Family Medicine

## 2017-11-05 ENCOUNTER — Other Ambulatory Visit: Payer: Self-pay | Admitting: Surgical

## 2017-11-05 DIAGNOSIS — N529 Male erectile dysfunction, unspecified: Secondary | ICD-10-CM

## 2017-11-05 MED ORDER — SILDENAFIL CITRATE 25 MG PO TABS: ORAL_TABLET | ORAL | 2 refills | Status: DC

## 2017-11-05 MED ORDER — WARFARIN SODIUM 1 MG PO TABS: 1.0000 mg | ORAL_TABLET | Freq: Every day | ORAL | 1 refills | Status: DC

## 2017-11-05 MED FILL — WARFARIN SODIUM 1 MG TABLET: 1 | 30 days supply | Qty: 30 | Fill #0

## 2017-11-05 MED FILL — SILDENAFIL CITRATE 25 MG TA: 25 | 30 days supply | Qty: 6 | Fill #0

## 2017-11-18 NOTE — Progress Notes (Signed)
Subjective:    [name] is a 52 y.o. male who presents today for his Complete Annual Exam.    Current Outpatient Medications:  .  esomeprazole (NEXIUM) 20 MG capsule, Take 20 mg by mouth daily at 12 noon., Disp: , Rfl:  .  gabapentin (NEURONTIN) 600 MG tablet, Take 1 tablet (600 mg total) by mouth 3 (three) times daily., Disp: 270 tablet, Rfl: 3 .  oxyCODONE-acetaminophen (PERCOCET) 10-325 MG tablet, Take 1 tablet by mouth every 8 (eight) hours as needed for pain., Disp: 90 tablet, Rfl: 0 .  propranolol (INDERAL) 10 MG tablet, Take 1 tablet (10 mg total) by mouth 3 (three) times daily., Disp: 90 tablet, Rfl: 2 .  rosuvastatin (CRESTOR) 5 MG tablet, Take 1 tablet (5 mg total) by mouth daily., Disp: 90 tablet, Rfl: 2 .  sildenafil (VIAGRA) 25 MG tablet, 2-3 tabs po x 1. Take 30 minutes to 4 hours prior to sexual activity., Disp: 21 tablet, Rfl: 2 .  warfarin (COUMADIN) 1 MG tablet, Take 1 tablet (1 mg total) by mouth daily., Disp: 30 tablet, Rfl: 1 .  warfarin (COUMADIN) 6 MG tablet, Take 1 tablet (6 mg total) by mouth daily., Disp: 35 tablet, Rfl: 3 .  [START ON 01/23/2018] oxyCODONE-acetaminophen (PERCOCET) 10-325 MG tablet, Take 1 tablet by mouth every 8 (eight) hours as needed for pain., Disp: 20 tablet, Rfl: 0 .  [START ON 12/23/2017] oxyCODONE-acetaminophen (PERCOCET) 10-325 MG tablet, Take 1 tablet by mouth every 8 (eight) hours as needed for pain., Disp: 20 tablet, Rfl: 0 .  oxyCODONE-acetaminophen (PERCOCET) 10-325 MG tablet, Take 1 tablet by mouth every 8 (eight) hours as needed for pain., Disp: 20 tablet, Rfl: 0  There are no preventive care reminders to display for this patient.  PMHx, SurgHx, SocialHx, Medications, and Allergies were reviewed in the Visit Navigator and updated as appropriate.   Past Medical History:  Diagnosis Date  . Anticoagulated on Coumadin   . Antiphospholipid antibody positive 06/27/2016   followed by dr kale ([facility])  . Anxiety with  depression 06/24/2014  . Chronic back pain   . ED (erectile dysfunction)   . GERD (gastroesophageal reflux disease)   . Hemorrhoids, internal, with bleeding   . Hiatal hernia   . History of adenomatous polyp of colon    07-08-2016  tubular adenoma's  . History of atrial fibrillation    episode 04/ 2018  . History of DVT of lower extremity   . Hyperlipidemia   . Iron deficiency anemia due to chronic blood loss followed by dr Irene Limbo ([facility])   from hemorrhoids and on coumadin-- hx Iron infusion's and blood transfusion's x2 07-09-2016  . Neuropathy, peripheral    right foot numbness from lumbar pinched nerve  . OA (osteoarthritis)   . PAD (peripheral artery disease) ([place]) followed by dr Bridgett Larsson--  per LOV note ABI 11-14-2016  widely patent stent/  11-27-2016 per pt no claudication symptoms   hx right CIA angioplasty and stent 02-24-2016/  right CIA thromectomy and patch angioplasty for restenosis 07-18-2015  . Primary hypercoagulable state ([place]) [D68.59] 09/15/2016  . Recovering alcoholic in remission ([place])    per pt since 04-25-2016  . S/P insertion of iliac artery stent    02-23-2017  right CIA PTA and stent/  05-11-2016  in-stent restenosis  s/p  thrombectomy/  07-17-2016 thrombectomy and  patch angioplasty right femoral artery   . Smokers' cough ([place])   . Wears glasses     Past  Surgical History:  Procedure Laterality Date  . ANGIOPLASTY ILLIAC ARTERY Right 07/17/2016   Procedure: ANGIOPLASTY RIGHT COMMON ILIAC ARTERY;  Surgeon: Conrad Gahanna, MD;  Location: [place] [facility];  Service: Vascular;  Laterality: Right;  . ANTERIOR CERVICAL DECOMP/DISCECTOMY FUSION  07/21/2010   C5 -- C7  . CARDIOVASCULAR STRESS TEST  06-14-2016   dr hilty   normal perfusion study w/ no reversible ischemia/  stress ef 44% but visually looks better , echo ordered (LVEF 30-44%)  , normal LV wall motion   . COLONOSCOPY N/A 07/08/2016   Procedure: COLONOSCOPY;  Surgeon: Clarene Essex, MD;  Location: [facility];   Service: Endoscopy;  Laterality: N/A;  . ESOPHAGOGASTRODUODENOSCOPY N/A 07/08/2016   Procedure: ESOPHAGOGASTRODUODENOSCOPY (EGD);  Surgeon: Clarene Essex, MD;  Location: [facility];  Service: Endoscopy;  Laterality: N/A;  . GROIN DEBRIDEMENT Right 07/25/2016   Procedure: EVACUATION HEMATOMA RIGHT GROIN;  Surgeon: Waynetta Sandy, MD;  Location: [place];  Service: Vascular;  Laterality: Right;  . HEMORRHOID SURGERY  2013  approx.   ?lanced/ banding  . HEMORRHOID SURGERY N/A 12/07/2016   Procedure: 3 COLUMN HEMORRHOIDECTOMY;  Surgeon: Leighton Ruff, MD;  Location: [facility];  Service: General;  Laterality: N/A;  . INTRAOPERATIVE ARTERIOGRAM Right 07/17/2016   Procedure: INTRA OPERATIVE ANGIOGRAM OF RIGHT COMMON ILIAC ARTERY;  Surgeon: Conrad Brackenridge, MD;  Location: [place];  Service: Vascular;  Laterality: Right;  . KNEE SURGERY Right 1983  . PATCH ANGIOPLASTY Right 07/17/2016   Procedure: PATCH ANGIOPLASTY RIGHT FEMORAL ARTERY USING Rueben Bash BIOLOGIC PATCH;  Surgeon: Conrad [place] Home, MD;  Location: [place];  Service: Vascular;  Laterality: Right;  . PERIPHERAL VASCULAR CATHETERIZATION N/A 02/24/2016   Procedure: Abdominal Aortogram w/Lower Extremity;  Surgeon: Conrad [place], MD;  Location: [facility];  Service: Cardiovascular;  Laterality: N/A;  . PERIPHERAL VASCULAR CATHETERIZATION Right 02/24/2016   Procedure: Peripheral Vascular Intervention;  Surgeon: Conrad Stockdale, MD;  Location: [facility];  Service: Cardiovascular;  Laterality: Right;  Common iliac  . PERIPHERAL VASCULAR CATHETERIZATION N/A 05/11/2016   Procedure: Abdominal Aortogram;  Surgeon: Conrad Beaver, MD;  Location: [place];  Service: Cardiovascular;  Laterality: N/A;  . PERIPHERAL VASCULAR CATHETERIZATION N/A 05/11/2016   Procedure: Lower Extremity Angiography;  Surgeon: Conrad Pajaro, MD;  Location: [place];  Service: Cardiovascular;  Laterality: N/A;  . THROMBECTOMY FEMORAL ARTERY Right  07/17/2016   Procedure: THROMBECTOMY RIGHT FEMORAL ARTERY;  Surgeon: Conrad , MD;  Location: [place];  Service: Vascular;  Laterality: Right;  . TRANSTHORACIC ECHOCARDIOGRAM  06-20-2016   dr hilty   ef 55-60%,  grade 1 diastolic dysfunction/  mild TR    Family History  Problem Relation Age of Onset  . Cancer Father        Lung   Social History   Tobacco Use  . Smoking status: Current Every Day Smoker    Packs/day: 1.00    Years: 33.00    Pack years: 33.00    Types: Cigarettes, E-cigarettes  . Smokeless tobacco: Never Used  Substance Use Topics  . Alcohol use: No    Comment: per pt recovering alcoholic in remission since 04-25-2016  . Drug use: No    Review of Systems:   Pertinent items are noted in the HPI. Otherwise, ROS is negative.  Objective:   Vitals:   11/19/17 1318  BP: 110/62  Pulse: 76  Temp: 98 F (36.7 C)  SpO2: 98%  Body mass index is 25.14 kg/m.  General Appearance:  Alert, cooperative, no distress, appears stated age  Head:  Normocephalic, without obvious abnormality, atraumatic  Eyes:  PERRL, conjunctiva/corneas clear, EOM's intact, fundi benign, both eyes       Ears:  Normal TM's and external ear canals, both ears  Nose: Nares normal, septum midline, mucosa normal, no drainage    or sinus tenderness  Throat: Lips, mucosa, and tongue normal; teeth and gums normal  Neck: Supple, symmetrical, trachea midline, no adenopathy; thyroid:  No enlargement/tenderness/nodules; no carotit bruit or JVD  Back:   Symmetric, no curvature, ROM normal, no CVA tenderness  Lungs:   Clear to auscultation bilaterally, respirations unlabored  Chest wall:  No tenderness or deformity  Heart:  Regular rate and rhythm, S1 and S2 normal, no murmur, rub   or gallop  Abdomen:   Soft, non-tender, bowel sounds active all four quadrants, no masses, no organomegaly  Extremities: Extremities normal, atraumatic, no cyanosis or edema  Prostate: Not done.   Skin: Skin color,  texture, turgor normal, no rashes or lesions  Lymph nodes: Cervical, supraclavicular, and axillary nodes normal  Neurologic: CNII-XII grossly intact. Normal strength, sensation and reflexes throughout    Assessment/Plan:   Terrace was seen today for annual exam.  Diagnoses and all orders for this visit:  Routine physical examination  Cigarette nicotine dependence without complication Comments: The patient was counseled on the dangers of tobacco use, and was advised to quit.  Reviewed strategies to maximize success, including removing cigarettes and smoking materials from environment, stress management, support of family/friends, written materials, local smoking cessation programs (1-800-QUIT-NOW and SMOKEFREE.GOV) and pharmacotherapy. Greater than 3 minutes were spent on counseling today.   Dyslipidemia -     Comprehensive metabolic panel -     Lipid panel  Leukocytosis, unspecified type -     CBC with Differential/Platelet  Iron deficiency anemia due to chronic blood loss -     CBC with Differential/Platelet -     Iron, TIBC and Ferritin Panel  Long term (current) use of anticoagulants [Z79.01]  Nocturia -     PSA  Chronic right-sided low back pain with right-sided sciatica -     oxyCODONE-acetaminophen (PERCOCET) 10-325 MG tablet; Take 1 tablet by mouth every 8 (eight) hours as needed for pain. -     oxyCODONE-acetaminophen (PERCOCET) 10-325 MG tablet; Take 1 tablet by mouth every 8 (eight) hours as needed for pain. -     oxyCODONE-acetaminophen (PERCOCET) 10-325 MG tablet; Take 1 tablet by mouth every 8 (eight) hours as needed for pain.    Patient Counseling: [x]   Nutrition: Stressed importance of moderation in sodium/caffeine intake, saturated fat and cholesterol, caloric balance, sufficient intake of fresh fruits, vegetables, and fiber.  [x]   Stressed the importance of regular exercise.   []   Substance Abuse: Discussed cessation/primary prevention of tobacco, alcohol, or  other drug use; driving or other dangerous activities under the influence; availability of treatment for abuse.   [x]   Injury prevention: Discussed safety belts, safety helmets, smoke detector, smoking near bedding or upholstery.   []   Sexuality: Discussed sexually transmitted diseases, partner selection, use of condoms, avoidance of unintended pregnancy and contraceptive alternatives.   [x]   Dental health: Discussed importance of regular tooth brushing, flossing, and dental visits.  [x]   Health maintenance and immunizations reviewed. Please refer to Health maintenance section.    Briscoe Deutscher, DO [place]

## 2017-11-19 ENCOUNTER — Ambulatory Visit (INDEPENDENT_AMBULATORY_CARE_PROVIDER_SITE_OTHER): Payer: 59 | Admitting: General Practice

## 2017-11-19 ENCOUNTER — Ambulatory Visit: Payer: 59

## 2017-11-19 ENCOUNTER — Ambulatory Visit (INDEPENDENT_AMBULATORY_CARE_PROVIDER_SITE_OTHER): Payer: 59 | Admitting: Family Medicine

## 2017-11-19 ENCOUNTER — Encounter: Payer: Self-pay | Admitting: Family Medicine

## 2017-11-19 VITALS — BP 110/62 | HR 76 | Temp 98.0°F | Ht 70.0 in | Wt 175.2 lb

## 2017-11-19 DIAGNOSIS — G8929 Other chronic pain: Secondary | ICD-10-CM | POA: Diagnosis not present

## 2017-11-19 DIAGNOSIS — Z7901 Long term (current) use of anticoagulants: Secondary | ICD-10-CM

## 2017-11-19 DIAGNOSIS — Z0000 Encounter for general adult medical examination without abnormal findings: Secondary | ICD-10-CM | POA: Diagnosis not present

## 2017-11-19 DIAGNOSIS — F17210 Nicotine dependence, cigarettes, uncomplicated: Secondary | ICD-10-CM

## 2017-11-19 DIAGNOSIS — D72829 Elevated white blood cell count, unspecified: Secondary | ICD-10-CM

## 2017-11-19 DIAGNOSIS — D6859 Other primary thrombophilia: Secondary | ICD-10-CM

## 2017-11-19 DIAGNOSIS — E785 Hyperlipidemia, unspecified: Secondary | ICD-10-CM | POA: Diagnosis not present

## 2017-11-19 DIAGNOSIS — R351 Nocturia: Secondary | ICD-10-CM

## 2017-11-19 DIAGNOSIS — M5441 Lumbago with sciatica, right side: Secondary | ICD-10-CM

## 2017-11-19 DIAGNOSIS — D500 Iron deficiency anemia secondary to blood loss (chronic): Secondary | ICD-10-CM | POA: Diagnosis not present

## 2017-11-19 LAB — CBC WITH DIFFERENTIAL/PLATELET
Basophils Absolute: 0.1 10*3/uL (ref 0.0–0.1)
Basophils Relative: 0.8 % (ref 0.0–3.0)
Eosinophils Absolute: 0.4 10*3/uL (ref 0.0–0.7)
Eosinophils Relative: 3.7 % (ref 0.0–5.0)
HCT: 48.1 % (ref 39.0–52.0)
Hemoglobin: 16.5 g/dL (ref 13.0–17.0)
Lymphocytes Relative: 23.2 % (ref 12.0–46.0)
Lymphs Abs: 2.8 10*3/uL (ref 0.7–4.0)
MCHC: 34.4 g/dL (ref 30.0–36.0)
MCV: 97.6 fl (ref 78.0–100.0)
Monocytes Absolute: 0.7 10*3/uL (ref 0.1–1.0)
Monocytes Relative: 5.6 % (ref 3.0–12.0)
Neutro Abs: 8 10*3/uL — ABNORMAL HIGH (ref 1.4–7.7)
Neutrophils Relative %: 66.7 % (ref 43.0–77.0)
Platelets: 216 10*3/uL (ref 150.0–400.0)
RBC: 4.93 Mil/uL (ref 4.22–5.81)
RDW: 12.9 % (ref 11.5–15.5)
WBC: 12.1 10*3/uL — ABNORMAL HIGH (ref 4.0–10.5)

## 2017-11-19 LAB — POCT INR: INR: 3 (ref 2.0–3.0)

## 2017-11-19 LAB — COMPREHENSIVE METABOLIC PANEL
ALT: 21 U/L (ref 0–53)
AST: 17 U/L (ref 0–37)
Albumin: 4.1 g/dL (ref 3.5–5.2)
Alkaline Phosphatase: 82 U/L (ref 39–117)
BUN: 8 mg/dL (ref 6–23)
CO2: 26 mEq/L (ref 19–32)
Calcium: 9.4 mg/dL (ref 8.4–10.5)
Chloride: 105 mEq/L (ref 96–112)
Creatinine, Ser: 0.88 mg/dL (ref 0.40–1.50)
GFR: 97.28 mL/min (ref >60.00)
Glucose, Bld: 88 mg/dL (ref 70–99)
Potassium: 4.2 mEq/L (ref 3.5–5.1)
Sodium: 139 mEq/L (ref 135–145)
Total Bilirubin: 0.9 mg/dL (ref 0.2–1.2)
Total Protein: 6.7 g/dL (ref 6.0–8.3)

## 2017-11-19 LAB — LIPID PANEL
Cholesterol: 140 mg/dL (ref 0–200)
HDL: 28.6 mg/dL — ABNORMAL LOW (ref >39.00)
LDL Cholesterol: 75 mg/dL (ref 0–99)
NonHDL: 111.41
Total CHOL/HDL Ratio: 5
Triglycerides: 181 mg/dL — ABNORMAL HIGH (ref 0.0–149.0)
VLDL: 36.2 mg/dL (ref 0.0–40.0)

## 2017-11-19 LAB — PSA: PSA: 0.38 ng/mL (ref 0.10–4.00)

## 2017-11-19 MED ORDER — OXYCODONE-ACETAMINOPHEN 10-325 MG PO TABS: 1.0000 | ORAL_TABLET | Freq: Three times a day (TID) | ORAL | 0 refills | Status: DC | PRN

## 2017-11-19 MED ORDER — OXYCODONE-ACETAMINOPHEN 10-325 MG PO TABS
1.0000 | ORAL_TABLET | Freq: Three times a day (TID) | ORAL | 0 refills | Status: DC | PRN
Start: 2018-01-23 — End: 2017-11-26

## 2017-11-19 MED ORDER — OXYCODONE-ACETAMINOPHEN 10-325 MG PO TABS
1.0000 | ORAL_TABLET | Freq: Three times a day (TID) | ORAL | 0 refills | Status: DC | PRN
Start: 2017-11-22 — End: 2017-11-26

## 2017-11-19 MED FILL — PROPRANOLOL 10 MG TABLET: 10 | 30 days supply | Qty: 90 | Fill #2

## 2017-11-19 NOTE — Patient Instructions (Addendum)
IMPRESSION: 1. Shallow right foraminal/extraforaminal disc protrusion at L4-5 without direct neural compression but potential irritation the right L4 nerve root. 2. Shallow central and left paracentral disc protrusion at L5-S1 without definite neural compression.   Electronically Signed   By: Marijo Sanes M.D.   On: 09/30/2016 14:38  IMPRESSION: 1. Status post ACDF at C5 through C7 without complication. No residual canal stenosis at these levels. Persistent moderate bilateral foraminal narrowing at C5-6 and moderate left foraminal narrowing at C6-7. 2. Broad base posterior disc protrusion at C3-4 with resultant moderate right foraminal narrowing. 3. Additional fairly mild multilevel degenerative spondylolysis as above.   Electronically Signed   By: Jeannine Boga M.D.   On: 07/11/2015 06:22

## 2017-11-19 NOTE — Patient Instructions (Addendum)
Pre visit review using our clinic review tool, if applicable. No additional management support is needed unless otherwise documented below in the visit note.  Continue to take 6 mg daily except 7 mg on tuesdays.  Re-check in 6 weeks.

## 2017-11-20 LAB — IRON,TIBC AND FERRITIN PANEL
%SAT: 26 % (calc) (ref 20–48)
Ferritin: 43 ng/mL (ref 38–380)
Iron: 91 ug/dL (ref 50–180)
TIBC: 345 mcg/dL (calc) (ref 250–425)

## 2017-11-22 ENCOUNTER — Encounter: Payer: Self-pay | Admitting: Family Medicine

## 2017-11-23 MED FILL — OXYCODONE-APAP 10-325: 10-325 | 7 days supply | Qty: 20 | Fill #0

## 2017-11-24 ENCOUNTER — Encounter: Payer: Self-pay | Admitting: Family Medicine

## 2017-11-24 DIAGNOSIS — G8929 Other chronic pain: Secondary | ICD-10-CM

## 2017-11-24 DIAGNOSIS — M5441 Lumbago with sciatica, right side: Principal | ICD-10-CM

## 2017-11-25 MED FILL — WARFARIN SODIUM 6 MG TABLET: 6 | 30 days supply | Qty: 30 | Fill #3

## 2017-11-26 MED ORDER — OXYCODONE-ACETAMINOPHEN 10-325 MG PO TABS: 1.0000 | ORAL_TABLET | Freq: Three times a day (TID) | ORAL | 0 refills | Status: AC | PRN

## 2017-11-26 MED ORDER — OXYCODONE-ACETAMINOPHEN 10-325 MG PO TABS: 1.0000 | ORAL_TABLET | Freq: Three times a day (TID) | ORAL | 0 refills | Status: DC | PRN

## 2017-11-28 MED FILL — OXYCODONE-APAP 10-325: 10-325 | 30 days supply | Qty: 90 | Fill #0

## 2017-12-06 ENCOUNTER — Other Ambulatory Visit: Payer: Self-pay

## 2017-12-06 DIAGNOSIS — R29898 Other symptoms and signs involving the musculoskeletal system: Secondary | ICD-10-CM

## 2017-12-26 ENCOUNTER — Other Ambulatory Visit: Payer: Self-pay | Admitting: Family Medicine

## 2017-12-26 DIAGNOSIS — F419 Anxiety disorder, unspecified: Secondary | ICD-10-CM

## 2017-12-26 MED FILL — OXYCODONE-APAP 10-325: 10-325 | 30 days supply | Qty: 90 | Fill #0

## 2017-12-27 DIAGNOSIS — M5412 Radiculopathy, cervical region: Secondary | ICD-10-CM | POA: Diagnosis not present

## 2017-12-27 DIAGNOSIS — R030 Elevated blood-pressure reading, without diagnosis of hypertension: Secondary | ICD-10-CM | POA: Diagnosis not present

## 2017-12-27 DIAGNOSIS — Z6826 Body mass index (BMI) 26.0-26.9, adult: Secondary | ICD-10-CM | POA: Diagnosis not present

## 2017-12-27 MED FILL — PROPRANOLOL 10 MG TABLET: 10 | 30 days supply | Qty: 90 | Fill #0

## 2017-12-31 ENCOUNTER — Ambulatory Visit (INDEPENDENT_AMBULATORY_CARE_PROVIDER_SITE_OTHER): Payer: 59 | Admitting: General Practice

## 2017-12-31 DIAGNOSIS — Z7901 Long term (current) use of anticoagulants: Secondary | ICD-10-CM | POA: Diagnosis not present

## 2017-12-31 DIAGNOSIS — D6859 Other primary thrombophilia: Secondary | ICD-10-CM

## 2017-12-31 LAB — POCT INR: INR: 6.3 — AB (ref 2.0–3.0)

## 2017-12-31 MED FILL — WARFARIN SODIUM 6 MG TABLET: 6 | 20 days supply | Qty: 20 | Fill #4

## 2017-12-31 NOTE — Patient Instructions (Addendum)
Pre visit review using our clinic review tool, if applicable. No additional management support is needed unless otherwise documented below in the visit note.  Hold coumadin through Thursday.  On Friday continue to take 6 mg daily except 7 mg on Tuesdays.  Re-check in 1 week.  Go to ER if any unusual bleeding occurs.

## 2018-01-02 ENCOUNTER — Other Ambulatory Visit: Payer: Self-pay | Admitting: Neurosurgery

## 2018-01-02 DIAGNOSIS — M5412 Radiculopathy, cervical region: Secondary | ICD-10-CM

## 2018-01-07 ENCOUNTER — Telehealth: Payer: Self-pay | Admitting: General Practice

## 2018-01-07 ENCOUNTER — Ambulatory Visit (INDEPENDENT_AMBULATORY_CARE_PROVIDER_SITE_OTHER): Payer: 59 | Admitting: General Practice

## 2018-01-07 DIAGNOSIS — Z7901 Long term (current) use of anticoagulants: Secondary | ICD-10-CM | POA: Diagnosis not present

## 2018-01-07 DIAGNOSIS — D6859 Other primary thrombophilia: Secondary | ICD-10-CM

## 2018-01-07 LAB — POCT INR: INR: 1.5 — AB (ref 2.0–3.0)

## 2018-01-07 NOTE — Patient Instructions (Addendum)
Pre visit review using our clinic review tool, if applicable. No additional management support is needed unless otherwise documented below in the visit note.  Take 9 mg today (9/19) and take 10 mg tomorrow (8/20) and then on Wednesday continue to take 6 mg daily except 7 mg on Tuesdays.  Re-check after myelogram.

## 2018-01-07 NOTE — Telephone Encounter (Signed)
-----   Message from Briscoe Deutscher, DO sent at 01/07/2018  4:17 PM EDT ----- Regarding: RE: coumadin Yes, thanks! EW ----- Message ----- From: Warden Fillers, RN Sent: 01/07/2018   1:19 PM EDT To: Briscoe Deutscher, DO Subject: coumadin                                       Hello Dr. Juleen China,  Patient is having a cervial myelogram.  Hasn't been scheduled yet.  Patient will need to hold coumadin for 4 days and will need a Lovenox bridge.  Do I have your permission to dose this and send to the pharmacy?  Thank you~ Villa Herb, RN

## 2018-01-08 ENCOUNTER — Other Ambulatory Visit: Payer: Self-pay | Admitting: Neurosurgery

## 2018-01-08 DIAGNOSIS — M5412 Radiculopathy, cervical region: Secondary | ICD-10-CM

## 2018-01-11 ENCOUNTER — Other Ambulatory Visit: Payer: Self-pay | Admitting: General Practice

## 2018-01-11 ENCOUNTER — Telehealth: Payer: Self-pay | Admitting: General Practice

## 2018-01-11 MED ORDER — ENOXAPARIN SODIUM 120 MG/0.8ML ~~LOC~~ SOLN: 120.0000 mg | SUBCUTANEOUS | 0 refills | Status: DC

## 2018-01-11 MED FILL — ENOXAPARIN 120 MG/0.8 ML SY: 120 | 7 days supply | Qty: 6 | Fill #0

## 2018-01-11 NOTE — Telephone Encounter (Signed)
Instructions for coumadin and Lovenox pre and post procedure.  8/30 - Last dose of coumadin 8/31 - Nothing 9/1 - Lovenox in the AM 9/2 - Lovenox in the AM 9/3 - Lovenox in the AM (Take by 7 am) (Appointment to check INR) @ 4:30 9/4 - Procedure (Do not take Lovenox today) 9/5 - Lovenox and 1 1/2 tablets (9 mg) of coumadin 9/6 - Lovenox and 1 1/2 tablets 9/7 - Lovenox and 1 1/2 tablets 9/8 - Lovenox and 1 tablet of coumadin 9/9 - Check INR at [facility] @ 4:30

## 2018-01-14 DIAGNOSIS — M5412 Radiculopathy, cervical region: Secondary | ICD-10-CM | POA: Insufficient documentation

## 2018-01-22 ENCOUNTER — Ambulatory Visit (INDEPENDENT_AMBULATORY_CARE_PROVIDER_SITE_OTHER): Payer: 59 | Admitting: General Practice

## 2018-01-22 DIAGNOSIS — D6859 Other primary thrombophilia: Secondary | ICD-10-CM

## 2018-01-22 DIAGNOSIS — Z7901 Long term (current) use of anticoagulants: Secondary | ICD-10-CM | POA: Diagnosis not present

## 2018-01-22 LAB — POCT INR: INR: 1.3 — AB (ref 2.0–3.0)

## 2018-01-22 MED FILL — ROSUVASTATIN CALCIUM 5 MG T: 5 | 90 days supply | Qty: 90 | Fill #1

## 2018-01-22 NOTE — Patient Instructions (Signed)
Pre visit review using our clinic review tool, if applicable. No additional management support is needed unless otherwise documented below in the visit note.  Patient stopped coumadin for 5 days in prep for myelogram.  Please follow patient instructions for resuming coumadin.

## 2018-01-23 ENCOUNTER — Ambulatory Visit
Admission: RE | Admit: 2018-01-23 | Discharge: 2018-01-23 | Disposition: A | Discharge status: Home / Self Care | Payer: 59 | Source: Ambulatory Visit | Attending: Neurosurgery | Admitting: Neurosurgery

## 2018-01-23 DIAGNOSIS — M5412 Radiculopathy, cervical region: Secondary | ICD-10-CM

## 2018-01-23 DIAGNOSIS — M4802 Spinal stenosis, cervical region: Secondary | ICD-10-CM | POA: Diagnosis not present

## 2018-01-23 MED ORDER — IOPAMIDOL (ISOVUE-M 300) INJECTION 61%
10.0000 mL | Freq: Once | INTRAMUSCULAR | Status: AC | PRN
  Administered 2018-01-23: 10 mL via INTRATHECAL

## 2018-01-23 MED ORDER — DIAZEPAM 5 MG PO TABS
10.0000 mg | ORAL_TABLET | Freq: Once | ORAL | Status: AC
  Administered 2018-01-23: 5 mg via ORAL

## 2018-01-23 MED FILL — OXYCODONE-APAP 10-325: 10-325 | 30 days supply | Qty: 90 | Fill #0

## 2018-01-23 NOTE — Discharge Instructions (Signed)

## 2018-01-28 ENCOUNTER — Ambulatory Visit (INDEPENDENT_AMBULATORY_CARE_PROVIDER_SITE_OTHER): Payer: 59 | Admitting: General Practice

## 2018-01-28 ENCOUNTER — Encounter: Payer: Self-pay | Admitting: Family Medicine

## 2018-01-28 DIAGNOSIS — D6859 Other primary thrombophilia: Secondary | ICD-10-CM

## 2018-01-28 DIAGNOSIS — Z7901 Long term (current) use of anticoagulants: Secondary | ICD-10-CM

## 2018-01-28 LAB — POCT INR: INR: 2.5 (ref 2.0–3.0)

## 2018-01-28 MED FILL — PROPRANOLOL 10 MG TABLET: 10 | 30 days supply | Qty: 90 | Fill #1

## 2018-01-28 NOTE — Patient Instructions (Signed)
Pre visit review using our clinic review tool, if applicable. No additional management support is needed unless otherwise documented below in the visit note.  Continue to take 6 mg daily except 7 mg on Tuesday.  Re-check in 6 weeks.  

## 2018-01-29 ENCOUNTER — Other Ambulatory Visit: Payer: Self-pay | Admitting: Surgical

## 2018-01-29 DIAGNOSIS — D6859 Other primary thrombophilia: Secondary | ICD-10-CM

## 2018-01-29 MED ORDER — WARFARIN SODIUM 6 MG PO TABS: 6.0000 mg | ORAL_TABLET | Freq: Every day | ORAL | 3 refills | Status: DC

## 2018-01-29 MED FILL — WARFARIN SODIUM 6 MG TABLET: 6 | 35 days supply | Qty: 35 | Fill #0

## 2018-01-30 MED FILL — GABAPENTIN 600 MG TABLET: 600 | 90 days supply | Qty: 270 | Fill #1

## 2018-01-31 MED FILL — SILDENAFIL CITRATE 25 MG TA: 25 | 90 days supply | Qty: 18 | Fill #1

## 2018-02-08 ENCOUNTER — Ambulatory Visit (INDEPENDENT_AMBULATORY_CARE_PROVIDER_SITE_OTHER): Payer: 59 | Admitting: Family Medicine

## 2018-02-08 ENCOUNTER — Encounter: Payer: Self-pay | Admitting: Family Medicine

## 2018-02-08 VITALS — BP 98/64 | Temp 98.5°F | Ht 70.0 in | Wt 176.6 lb

## 2018-02-08 DIAGNOSIS — G8929 Other chronic pain: Secondary | ICD-10-CM | POA: Diagnosis not present

## 2018-02-08 DIAGNOSIS — Z23 Encounter for immunization: Secondary | ICD-10-CM | POA: Diagnosis not present

## 2018-02-08 DIAGNOSIS — M5441 Lumbago with sciatica, right side: Secondary | ICD-10-CM

## 2018-02-08 DIAGNOSIS — F17210 Nicotine dependence, cigarettes, uncomplicated: Secondary | ICD-10-CM

## 2018-02-08 DIAGNOSIS — D500 Iron deficiency anemia secondary to blood loss (chronic): Secondary | ICD-10-CM

## 2018-02-08 MED ORDER — OXYCODONE-ACETAMINOPHEN 10-325 MG PO TABS: 1.0000 | ORAL_TABLET | Freq: Three times a day (TID) | ORAL | 0 refills | Status: DC | PRN

## 2018-02-08 MED ORDER — PREDNISONE 50 MG PO TABS: ORAL_TABLET | ORAL | 0 refills | Status: DC

## 2018-02-08 MED FILL — predniSONE 50 MG TABS: 50 | 5 days supply | Qty: 5 | Fill #0

## 2018-02-08 NOTE — Progress Notes (Signed)
[name] is a 52 y.o. male is here for follow up.  History of Present Illness:   [name] CMA acting as scribe for Dr. Juleen China.  HPI: Patient comes in today for his three month follow up.   Back Pain: Patient is having some back pain. He just traveled to [place] in the vehicle. This caused some discomfort. Dr. Juleen China went over the Myelography test results with the patient. He will be seeing the Neurosurgeon next week. He has been having injections with Dr. Tamala Julian. Patient thinks that he is going to need another round of prednisone. He stated that the prednisone helped the first day of taking. The Surgeon did not think that his lower back pain was that bad. Patient has been having some leg and foot numbness. The surgeon does not think that this is not coming from the back pain. Patient states that the gabapentin does help with the foot pain. We will prescribe the prednisone 50 mg today.   Due for CBC, TIBC.  Review of Systems  Constitutional: Negative for chills, fever, malaise/fatigue and weight loss.  Respiratory: Negative for cough, shortness of breath and wheezing.   Cardiovascular: Negative for chest pain, palpitations and leg swelling.  Gastrointestinal: Negative for abdominal pain, constipation, diarrhea, nausea and vomiting.  Genitourinary: Negative for dysuria and urgency.  Musculoskeletal: Positive for back pain, myalgias and neck pain. Negative for falls.  Skin: Negative for rash.  Neurological: Positive for tingling and sensory change. Negative for dizziness and headaches.  Psychiatric/Behavioral: Negative for depression, substance abuse and suicidal ideas. The patient is not nervous/anxious.    There are no preventive care reminders to display for this patient. Depression screen [facility] 2/9 07/08/2016 07/05/2016 03/08/2015  Decreased Interest 0 1 0  Down, Depressed, Hopeless 0 0 0  PHQ - 2 Score 0 1 0   PMHx, SurgHx, SocialHx, FamHx, Medications, and Allergies were  reviewed in the Visit Navigator and updated as appropriate.   Patient Active Problem List   Diagnosis Date Noted  . Cervical radiculitis 01/14/2018  . Degenerative arthritis of right knee 09/06/2017  . Nonallopathic lesion of lumbosacral region 09/06/2017  . Nonallopathic lesion of thoracic region 09/06/2017  . Nonallopathic lesion of sacral region 09/06/2017  . Leukocytosis 06/18/2017  . Low testosterone in male 05/04/2017  . Erectile dysfunction 11/19/2016  . Chronic right-sided low back pain with right-sided sciatica 11/19/2016  . Internal hemorrhoid 11/19/2016  . 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 Arthritis of left acromioclavicular joint 09/29/2016  . Monitoring for anticoagulant use 09/15/2016  . Long term (current) use of anticoagulants [Z79.01] 09/15/2016  . Primary hypercoagulable state ([place]) [D68.59] 09/15/2016  . History of alcohol abuse 07/11/2016  . Chronic left shoulder pain 07/08/2016  . Iron deficiency anemia due to chronic blood loss   . Gastroesophageal reflux disease   . Dyslipidemia 06/27/2016  . Antiphospholipid antibody positive 06/27/2016  . Shortness of breath 06/02/2016  . Thrombus 06/02/2016  . PAD (peripheral artery disease) ([place]) 06/02/2016  . Claudication ([place]) 06/02/2016  . 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 Atherosclerosis of native arteries of extremity with intermittent claudication ([place]) 02/24/2016  . Weakness of right leg 01/11/2016  . Piriformis syndrome of right side 12/29/2015  . Leg length discrepancy 12/29/2015  . Anxiety with depression 06/24/2014  . Nicotine dependence 06/24/2014   Social History   Tobacco Use  . Smoking status: Current Every Day Smoker    Packs/day: 1.00    Years: 33.00    Pack years: 33.00  Types: Cigarettes, E-cigarettes  . Smokeless tobacco: Never Used  Substance Use Topics  . Alcohol use: No    Comment: per pt recovering alcoholic in remission since 04-25-2016  . Drug use: No   Current  Medications and Allergies:   .  enoxaparin (LOVENOX) 120 MG/0.8ML injection, Inject 0.8 mLs (120 mg total) into the skin daily., Disp: 7 Syringe, Rfl: 0 .  esomeprazole (NEXIUM) 20 MG capsule, Take 20 mg by mouth daily at 12 noon., Disp: , Rfl:  .  gabapentin (NEURONTIN) 600 MG tablet, Take 1 tablet (600 mg total) by mouth 3 (three) times daily., Disp: 270 tablet, Rfl: 3 .  oxyCODONE-acetaminophen (PERCOCET) 10-325 MG tablet, Take 1 tablet by mouth every 8 (eight) hours as needed for pain., Disp: 90 tablet, Rfl: 0 .  propranolol (INDERAL) 10 MG tablet, TAKE 1 TABLET BY MOUTH 3 TIMES DAILY, Disp: 90 tablet, Rfl: 2 .  rosuvastatin (CRESTOR) 5 MG tablet, Take 1 tablet (5 mg total) by mouth daily., Disp: 90 tablet, Rfl: 2 .  sildenafil (VIAGRA) 25 MG tablet, 2-3 tabs po x 1. Take 30 minutes to 4 hours prior to sexual activity., Disp: 21 tablet, Rfl: 2 .  warfarin (COUMADIN) 1 MG tablet, Take 1 tablet (1 mg total) by mouth daily. (Patient taking differently: Take 7 mg by mouth daily. ), Disp: 30 tablet, Rfl: 1 .  warfarin (COUMADIN) 6 MG tablet, Take 1 tablet (6 mg total) by mouth daily., Disp: 35 tablet, Rfl: 3   Allergies  Allergen Reactions  . Cymbalta [Duloxetine Hcl] Other (See Comments)    Headaches   Review of Systems   Pertinent items are noted in the HPI. Otherwise, ROS is negative.  Vitals:   Vitals:   02/08/18 1601  BP: 98/64  Temp: 98.5 F (36.9 C)  TempSrc: Oral  Weight: 176 lb 9.6 oz (80.1 kg)  Height: 5\' 10"  (1.778 m)     Body mass index is 25.34 kg/m.  Physical Exam:   Physical Exam  Constitutional: He is oriented to person, place, and time. He appears well-developed and well-nourished. No distress.  HENT:  Head: Normocephalic and atraumatic.  Right Ear: External ear normal.  Left Ear: External ear normal.  Nose: Nose normal.  Mouth/Throat: Oropharynx is clear and moist.  Eyes: Pupils are equal, round, and reactive to light. Conjunctivae and EOM are normal.    Neck: Normal range of motion. Neck supple.  Cardiovascular: Normal rate, regular rhythm, normal heart sounds and intact distal pulses.  Pulmonary/Chest: Effort normal and breath sounds normal.  Abdominal: Soft. Bowel sounds are normal.  Neurological: He is alert and oriented to person, place, and time.  Skin: Skin is warm and dry.  Psychiatric: He has a normal mood and affect. His behavior is normal. Judgment and thought content normal.  Nursing note and vitals reviewed.  Results for orders placed or performed in visit on 02/08/18  Comprehensive metabolic panel  Result Value Ref Range   Glucose, Bld 89 65 - 99 mg/dL   BUN 8 7 - 25 mg/dL   Creat 0.85 0.70 - 1.33 mg/dL   BUN/Creatinine Ratio NOT APPLICABLE 6 - 22 (calc)   Sodium 137 135 - 146 mmol/L   Potassium 4.3 3.5 - 5.3 mmol/L   Chloride 105 98 - 110 mmol/L   CO2 22 20 - 32 mmol/L   Calcium 9.2 8.6 - 10.3 mg/dL   Total Protein 6.6 6.1 - 8.1 g/dL   Albumin 4.1 3.6 - 5.1 g/dL  Globulin 2.5 1.9 - 3.7 g/dL (calc)   AG Ratio 1.6 1.0 - 2.5 (calc)   Total Bilirubin 0.7 0.2 - 1.2 mg/dL   Alkaline phosphatase (APISO) 74 40 - 115 U/L   AST 16 10 - 35 U/L   ALT 19 9 - 46 U/L  CBC with Differential/Platelet  Result Value Ref Range   WBC 15.1 (H) 3.8 - 10.8 Thousand/uL   RBC 4.40 4.20 - 5.80 Million/uL   Hemoglobin 14.9 13.2 - 17.1 g/dL   HCT 42.7 38.5 - 50.0 %   MCV 97.0 80.0 - 100.0 fL   MCH 33.9 (H) 27.0 - 33.0 pg   MCHC 34.9 32.0 - 36.0 g/dL   RDW 13.4 11.0 - 15.0 %   Platelets 216 140 - 400 Thousand/uL   MPV 10.9 7.5 - 12.5 fL   Neutro Abs 9,407 (H) 1,500 - 7,800 cells/uL   Lymphs Abs 3,956 (H) 850 - 3,900 cells/uL   WBC mixed population 649 200 - 950 cells/uL   Eosinophils Absolute 921 (H) 15 - 500 cells/uL   Basophils Absolute 166 0 - 200 cells/uL   Neutrophils Relative % 62.3 %   Total Lymphocyte 26.2 %   Monocytes Relative 4.3 %   Eosinophils Relative 6.1 %   Basophils Relative 1.1 %  Iron, TIBC and Ferritin Panel   Result Value Ref Range   Iron 41 (L) 50 - 180 mcg/dL   TIBC 323 250 - 425 mcg/dL (calc)   %SAT 13 (L) 20 - 48 % (calc)   Ferritin 69 38 - 380 ng/mL   Assessment and Plan:   Susan was seen today for follow-up.  Diagnoses and all orders for this visit:  Iron deficiency anemia due to chronic blood loss -     Comprehensive metabolic panel -     CBC with Differential/Platelet -     Iron, TIBC and Ferritin Panel  Chronic right-sided low back pain with right-sided sciatica -     predniSONE (DELTASONE) 50 MG tablet; Take one tablet daily for five days. -     oxyCODONE-acetaminophen (PERCOCET) 10-325 MG tablet; Take 1 tablet by mouth every 8 (eight) hours as needed for pain. -     oxyCODONE-acetaminophen (PERCOCET) 10-325 MG tablet; Take 1 tablet by mouth every 8 (eight) hours as needed for pain. -     oxyCODONE-acetaminophen (PERCOCET) 10-325 MG tablet; Take 1 tablet by mouth every 8 (eight) hours as needed for pain.  Need for immunization against influenza -     Flu Vaccine QUAD 36+ mos IM  Cigarette nicotine dependence without complication Comments: Still smoking. Possible caludication symptoms still. Will consider Pletal.  . Reviewed expectations re: course of current medical issues. . Discussed self-management of symptoms. . Outlined signs and symptoms indicating need for more acute intervention. . Patient verbalized understanding and all questions were answered. Marland Kitchen Health Maintenance issues including appropriate healthy diet, exercise, and smoking avoidance were discussed with patient. . See orders for this visit as documented in the electronic medical record. . Patient received an After Visit Summary.  CMA served as Education administrator during this visit. History, Physical, and Plan performed by medical provider. The above documentation has been reviewed and is accurate and complete. Briscoe Deutscher, D.O.  Briscoe Deutscher, DO [place], [place] [facility] 02/12/2018

## 2018-02-09 LAB — CBC WITH DIFFERENTIAL/PLATELET
Basophils Absolute: 166 cells/uL (ref 0–200)
Basophils Relative: 1.1 %
Eosinophils Absolute: 921 cells/uL — ABNORMAL HIGH (ref 15–500)
Eosinophils Relative: 6.1 %
HCT: 42.7 % (ref 38.5–50.0)
Hemoglobin: 14.9 g/dL (ref 13.2–17.1)
Lymphs Abs: 3956 cells/uL — ABNORMAL HIGH (ref 850–3900)
MCH: 33.9 pg — ABNORMAL HIGH (ref 27.0–33.0)
MCHC: 34.9 g/dL (ref 32.0–36.0)
MCV: 97 fL (ref 80.0–100.0)
MPV: 10.9 fL (ref 7.5–12.5)
Monocytes Relative: 4.3 %
Neutro Abs: 9407 cells/uL — ABNORMAL HIGH (ref 1500–7800)
Neutrophils Relative %: 62.3 %
Platelets: 216 10*3/uL (ref 140–400)
RBC: 4.4 10*6/uL (ref 4.20–5.80)
RDW: 13.4 % (ref 11.0–15.0)
Total Lymphocyte: 26.2 %
WBC mixed population: 649 cells/uL (ref 200–950)
WBC: 15.1 10*3/uL — ABNORMAL HIGH (ref 3.8–10.8)

## 2018-02-09 LAB — COMPREHENSIVE METABOLIC PANEL
AG Ratio: 1.6 (calc) (ref 1.0–2.5)
ALT: 19 U/L (ref 9–46)
AST: 16 U/L (ref 10–35)
Albumin: 4.1 g/dL (ref 3.6–5.1)
Alkaline phosphatase (APISO): 74 U/L (ref 40–115)
BUN: 8 mg/dL (ref 7–25)
CO2: 22 mmol/L (ref 20–32)
Calcium: 9.2 mg/dL (ref 8.6–10.3)
Chloride: 105 mmol/L (ref 98–110)
Creat: 0.85 mg/dL (ref 0.70–1.33)
Globulin: 2.5 g/dL (calc) (ref 1.9–3.7)
Glucose, Bld: 89 mg/dL (ref 65–99)
Potassium: 4.3 mmol/L (ref 3.5–5.3)
Sodium: 137 mmol/L (ref 135–146)
Total Bilirubin: 0.7 mg/dL (ref 0.2–1.2)
Total Protein: 6.6 g/dL (ref 6.1–8.1)

## 2018-02-09 LAB — IRON,TIBC AND FERRITIN PANEL
%SAT: 13 % (calc) — ABNORMAL LOW (ref 20–48)
Ferritin: 69 ng/mL (ref 38–380)
Iron: 41 ug/dL — ABNORMAL LOW (ref 50–180)
TIBC: 323 mcg/dL (calc) (ref 250–425)

## 2018-02-12 ENCOUNTER — Encounter: Payer: Self-pay | Admitting: Family Medicine

## 2018-02-14 ENCOUNTER — Encounter: Payer: Self-pay | Admitting: Family Medicine

## 2018-02-14 DIAGNOSIS — S129XXA Fracture of neck, unspecified, initial encounter: Secondary | ICD-10-CM | POA: Diagnosis not present

## 2018-02-19 ENCOUNTER — Other Ambulatory Visit: Payer: Self-pay | Admitting: Family Medicine

## 2018-02-19 DIAGNOSIS — M5412 Radiculopathy, cervical region: Secondary | ICD-10-CM

## 2018-02-19 MED ORDER — CILOSTAZOL 50 MG PO TABS: 50.0000 mg | ORAL_TABLET | Freq: Two times a day (BID) | ORAL | 0 refills | Status: DC

## 2018-02-19 MED FILL — predniSONE 50 MG TABS: 50 | 5 days supply | Qty: 5 | Fill #0

## 2018-02-19 MED FILL — CILOSTAZOL 50 MG TABLET: 50 | 30 days supply | Qty: 60 | Fill #0

## 2018-02-20 MED FILL — OXYCODONE-APAP 10-325: 10-325 | 30 days supply | Qty: 90 | Fill #0

## 2018-02-22 ENCOUNTER — Telehealth: Payer: Self-pay | Admitting: Surgical

## 2018-02-22 NOTE — Telephone Encounter (Signed)
Copied from [place] [phone]. Topic: General - Other >> Feb 22, 2018 12:08 PM Janace Aris A wrote: [facility] called in saying they do not have nuero at the office.   [phone]

## 2018-02-22 NOTE — Telephone Encounter (Signed)
Where would you like to send him?

## 2018-02-22 NOTE — Telephone Encounter (Signed)
Spoke with [facility] and Dr. Lynann Bologna is going to review notes then they will call to schedule the patient.

## 2018-02-22 NOTE — Telephone Encounter (Signed)
See the referral and look at the name of physician. May need to clarify with him.

## 2018-02-25 ENCOUNTER — Encounter: Payer: Self-pay | Admitting: Family Medicine

## 2018-02-27 ENCOUNTER — Other Ambulatory Visit: Payer: Self-pay | Admitting: Surgical

## 2018-02-27 DIAGNOSIS — R1030 Lower abdominal pain, unspecified: Secondary | ICD-10-CM

## 2018-02-27 DIAGNOSIS — D500 Iron deficiency anemia secondary to blood loss (chronic): Secondary | ICD-10-CM

## 2018-02-28 ENCOUNTER — Ambulatory Visit (HOSPITAL_COMMUNITY)
Admission: RE | Admit: 2018-02-28 | Discharge: 2018-02-28 | Disposition: A | Discharge status: Home / Self Care | Payer: 59 | Source: Ambulatory Visit | Attending: Family Medicine | Admitting: Family Medicine

## 2018-02-28 DIAGNOSIS — D509 Iron deficiency anemia, unspecified: Secondary | ICD-10-CM | POA: Diagnosis not present

## 2018-02-28 DIAGNOSIS — D500 Iron deficiency anemia secondary to blood loss (chronic): Secondary | ICD-10-CM

## 2018-02-28 MED ORDER — SODIUM CHLORIDE 0.9 % IV SOLN
INTRAVENOUS | Status: DC | PRN
  Administered 2018-02-28: 250 mL via INTRAVENOUS

## 2018-02-28 MED ORDER — SODIUM CHLORIDE 0.9 % IV SOLN
510.0000 mg | Freq: Once | INTRAVENOUS | Status: AC
  Administered 2018-02-28: 510 mg via INTRAVENOUS
  Filled 2018-02-28: qty 17

## 2018-02-28 MED ORDER — FERUMOXYTOL INJECTION 510 MG/17 ML: 510.0000 mg | Freq: Once | INTRAVENOUS | Status: DC

## 2018-02-28 NOTE — Progress Notes (Signed)
PATIENT CARE CENTER NOTE  Diagnosis: Iron Deficiency Anemia    Provider: Dr. Briscoe Deutscher    Procedure: IV Feraheme    Note: Patient received Feraheme infusion. Monitored patient for 30 minutes post-infusion. Patient tolerated infusion well with no adverse reaction. Vital signs stable. Discharge instructions given. Patient alert, oriented and ambulatory at discharge.

## 2018-02-28 NOTE — Discharge Instructions (Signed)

## 2018-03-03 MED FILL — WARFARIN SODIUM 6 MG TABLET: 6 | 35 days supply | Qty: 35 | Fill #1

## 2018-03-03 MED FILL — PROPRANOLOL 10 MG TABLET: 10 | 30 days supply | Qty: 90 | Fill #2

## 2018-03-08 ENCOUNTER — Encounter: Payer: Self-pay | Admitting: Family Medicine

## 2018-03-08 ENCOUNTER — Ambulatory Visit (INDEPENDENT_AMBULATORY_CARE_PROVIDER_SITE_OTHER): Payer: 59 | Admitting: General Practice

## 2018-03-08 ENCOUNTER — Ambulatory Visit (INDEPENDENT_AMBULATORY_CARE_PROVIDER_SITE_OTHER)
Admission: RE | Admit: 2018-03-08 | Discharge: 2018-03-08 | Disposition: A | Discharge status: Home / Self Care | Payer: 59 | Source: Ambulatory Visit | Attending: Family Medicine | Admitting: Family Medicine

## 2018-03-08 DIAGNOSIS — M542 Cervicalgia: Secondary | ICD-10-CM | POA: Diagnosis not present

## 2018-03-08 DIAGNOSIS — D6859 Other primary thrombophilia: Secondary | ICD-10-CM

## 2018-03-08 DIAGNOSIS — Z7901 Long term (current) use of anticoagulants: Secondary | ICD-10-CM | POA: Diagnosis not present

## 2018-03-08 DIAGNOSIS — R1030 Lower abdominal pain, unspecified: Secondary | ICD-10-CM | POA: Diagnosis not present

## 2018-03-08 DIAGNOSIS — K760 Fatty (change of) liver, not elsewhere classified: Secondary | ICD-10-CM | POA: Diagnosis not present

## 2018-03-08 LAB — POCT INR: INR: 2.9 (ref 2.0–3.0)

## 2018-03-08 MED ORDER — IOPAMIDOL (ISOVUE-300) INJECTION 61%
100.0000 mL | Freq: Once | INTRAVENOUS | Status: AC | PRN
  Administered 2018-03-08: 100 mL via INTRAVENOUS

## 2018-03-08 NOTE — Patient Instructions (Addendum)
Pre visit review using our clinic review tool, if applicable. No additional management support is needed unless otherwise documented below in the visit note.  Continue to take 6 mg daily except 7 mg on Tuesday.  Re-check in 6 weeks.  

## 2018-03-11 ENCOUNTER — Ambulatory Visit: Payer: 59

## 2018-03-11 DIAGNOSIS — M542 Cervicalgia: Secondary | ICD-10-CM | POA: Diagnosis not present

## 2018-03-13 ENCOUNTER — Telehealth: Payer: Self-pay

## 2018-03-13 ENCOUNTER — Other Ambulatory Visit: Payer: Self-pay

## 2018-03-13 ENCOUNTER — Telehealth: Payer: Self-pay | Admitting: *Deleted

## 2018-03-13 DIAGNOSIS — D500 Iron deficiency anemia secondary to blood loss (chronic): Secondary | ICD-10-CM

## 2018-03-13 NOTE — Telephone Encounter (Signed)
Pt wife, Almyra Free, LVM 03/12/18 at [phone] requesting appt for pt to f/u with Dr. Irene Limbo regarding unresolved anemia. Request by Briscoe Deutscher, DO for pt to f/u with hematology. Per Dr. Irene Limbo, ok to schedule next available appt in mid November with lab work. Pt scheduled for 04/12/18 for labs at 9 and MD visit at 1120. Called pt wife who confirmed appts. Orders placed for CMP, CBC, Ferritin, and Iron profile per MD.

## 2018-03-13 NOTE — Telephone Encounter (Signed)
Received VM 10/22 AM forwarded from [facility] scheduling: Patient's wife stated husband was patient of Dr. Irene Limbo and requested appt to evaluate ongoing chronic anemia. Last appt 10/23/16. Message given to Dr. Irene Limbo who requested additional information as over a year since patient seen in this office. Contacted wife who stated husband had hemorrhoidectomy which did not resolve anemia as intended. Has also had Colonoscopy, endoscopy and pelvic and abdominal scans. Informed Dr. Irene Limbo of this, he stated patient can be seen as established patient, will need labs: CBC, CMP, Ferritin, Iron profile. Left VM at 1636. Will call again 10/23.

## 2018-03-14 ENCOUNTER — Other Ambulatory Visit: Payer: 59

## 2018-03-21 MED FILL — OXYCODONE-APAP 10-325: 10-325 | 30 days supply | Qty: 90 | Fill #0

## 2018-03-25 ENCOUNTER — Telehealth: Payer: Self-pay

## 2018-03-25 NOTE — Telephone Encounter (Signed)
Primary Cardiologist: [facility] Pre-operative Risk Assessment    Request for surgical clearance:  1. What type of surgery is being performed?  C5-6, C6-7 posterior cervical fusion  2. When is this surgery scheduled? TBD   3. What type of clearance is required (medical clearance vs. Pharmacy clearance to hold med vs. Both)?  Both  4. Are there any medications that need to be held prior to surgery and how long? Not listed   5. Practice name and name of physician performing surgery? [facility] (Dr.Henry Pool)   6. What is your office phone number [phone]    7.   What is your office fax number [phone]  8.   Anesthesia type (None, local, MAC, general) ?  General   [name] 03/25/2018, 4:23 PM  _________________________________________________________________   (provider comments below)

## 2018-03-27 ENCOUNTER — Encounter: Payer: Self-pay | Admitting: Family Medicine

## 2018-03-27 NOTE — Telephone Encounter (Signed)
   Primary Cardiologist:Kenneth C Hilty, MD  Chart reviewed as part of pre-operative protocol coverage. Because of [name] past medical history and time since last visit, he/she will require a follow-up visit in order to better assess preoperative cardiovascular risk. Last OV was 07/2016 with suggestion to f/u 10/2016 which never seemed to have occurred.  Pre-op covering staff: - Please schedule appointment and call patient to inform them. - Please contact requesting surgeon's office via preferred method (i.e, phone, fax) to inform them of need for appointment prior to surgery.  Patient is noted to be on warfarin, but prescribed through primary care so they will be the ones to weigh in on this.  [name], PA-C  03/27/2018, 4:53 PM

## 2018-03-27 NOTE — Consult Note (Signed)
Patient has a bleeding risk of: on Coumadin for coagulapathy. Patient does not have objections to receiving blood products if needed.  Mobility/Exercise tolerance: []   Poor/1 MET (Examples: can perform ADLs, walk indoors around house, walk on level ground at 2 mph, light housework, wash dishes) [x]   Fair/4 METs (Examples: Climb one flight of stairs, Walk on level ground at 4 mph, Run a short distance, Vacuum, lift furniture, Golf, doubles tennis, dancing) []   Excellent/10 METs (Examples: Swimming, Running, [employer], [employer], Skiing)  Patient Active Problem List   Diagnosis Date Noted  . Cervical radiculitis 01/14/2018  . Degenerative arthritis of right knee 09/06/2017  . Nonallopathic lesion of lumbosacral region 09/06/2017  . Nonallopathic lesion of thoracic region 09/06/2017  . Nonallopathic lesion of sacral region 09/06/2017  . Leukocytosis 06/18/2017  . Low testosterone in male 05/04/2017  . Erectile dysfunction 11/19/2016  . Chronic right-sided low back pain with right-sided sciatica 11/19/2016  . Internal hemorrhoid 11/19/2016  . 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 Arthritis of left acromioclavicular joint 09/29/2016  . Monitoring for anticoagulant use 09/15/2016  . Long term (current) use of anticoagulants [Z79.01] 09/15/2016  . Primary hypercoagulable state ([place]) [D68.59] 09/15/2016  . History of alcohol abuse 07/11/2016  . Chronic left shoulder pain 07/08/2016  . Iron deficiency anemia due to chronic blood loss   . Gastroesophageal reflux disease   . Dyslipidemia 06/27/2016  . Antiphospholipid antibody positive 06/27/2016  . Shortness of breath 06/02/2016  . Thrombus 06/02/2016  . PAD (peripheral artery disease) ([place]) 06/02/2016  . Claudication ([place]) 06/02/2016  . 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 Atherosclerosis of native arteries of extremity with intermittent claudication ([place]) 02/24/2016  . Weakness of right leg 01/11/2016  . Piriformis  syndrome of right side 12/29/2015  . Leg length discrepancy 12/29/2015  . Anxiety with depression 06/24/2014  . Nicotine dependence 06/24/2014   Social History   Tobacco Use  Smoking Status Current Every Day Smoker  . Packs/day: 1.00  . Years: 33.00  . Pack years: 33.00  . Types: Cigarettes, E-cigarettes  Smokeless Tobacco Never Used    Objective:   Cardiographics ECG (09/15/16): normal sinus rhythm, no blocks or conduction defects, no ischemic changes   Echocardiogram (06/20/16): - Left ventricle: The cavity size was normal. Systolic function was   normal. The estimated ejection fraction was in the range of 55%   to 60%. Wall motion was normal; there were no regional wall   motion abnormalities. The study is not technically sufficient to   allow evaluation of LV diastolic function. Based on mitral inflow   patterns there appears to be grade 1 diastolic dysfunction.   However, based on tissue Doppler, diastolic function is normal. - Aortic valve: Transvalvular velocity was within the normal range.   There was no stenosis. There was no regurgitation. - Mitral valve: Transvalvular velocity was within the normal range.   There was no evidence for stenosis. There was no regurgitation. - Right ventricle: The cavity size was normal. Wall thickness was   normal. Systolic function was normal. - Pulmonic valve: Transvalvular velocity was within the normal   range. There was no evidence for stenosis. - Pulmonary arteries: Systolic pressure was within the normal   range. PA peak pressure: 25 mm Hg (S).  Imaging CT ABD/PELVIS 03/08/18: Normal. CT CHEST 07/10/17: 1. Underlying centrilobular and mild paraseptal emphysematous change. No edema or consolidation. No pleural effusion. Areas of mild scarring, most notably in the superior segment left lower lobe. 2. Small  nodular opacities left upper lobe posteriorly, largest measuring 2 mm. No follow-up needed if patient is low-risk (and has no  known or suspected primary neoplasm). Non-contrast chest CT can be considered in 12 months if patient is high-risk.3.  Mild lower lobe bronchiectatic change bilaterally. 4.  No evident adenopathy. 5.  Small hiatal hernia. 6. Foci of aortic atherosclerosis. Minimal coronary artery calcification.  Lab Review  CBC Latest Ref Rng & Units 02/08/2018 11/19/2017 10/02/2017  WBC 3.8 - 10.8 Thousand/uL 15.1(H) 12.1(H) 13.1(H)  Hemoglobin 13.2 - 17.1 g/dL 14.9 16.5 15.9  Hematocrit 38.5 - 50.0 % 42.7 48.1 46.2  Platelets 140 - 400 Thousand/uL 216 216.0 244.0   Lab Results  Component Value Date   IRON 41 (L) 02/08/2018   TIBC 323 02/08/2018   FERRITIN 69 02/08/2018   Lab Results  Component Value Date   ALT 19 02/08/2018   AST 16 02/08/2018   ALKPHOS 82 11/19/2017   BILITOT 0.7 02/08/2018   Lab Results  Component Value Date   CREATININE 0.85 02/08/2018   Lab Results  Component Value Date   INR 2.9 03/08/2018   INR 2.5 01/28/2018   INR 1.3 (A) 01/22/2018    Assessment:   52 y.o. male with planned C5-6, C6-7 posterior cervical fusion. Known risk factors for perioperative complications: coumadin use, Hx of antiphospholid antibody syndrome, smoker, vascular disease.     Plan:   1. Preoperative workup as follows: none. 2. Change in medication regimen before surgery: Warfarin management: Discontinue warfarin 5 days before surgery, use low molecular weight heparin until stable (usually 24-48 hours after surgery, but should depend on postoperative bleeding and hemoglobin levels). 3. Other measures: Postoperative incentive spirometry to prevent pneumonia. Adjustment of dosing of applicable medications for renal insufficiency. Adjustment of dosing of applicable medications for hepatic insufficiency.  Briscoe Deutscher, DO  [facility], [facility] Phone: [phone] Fax: [phone]

## 2018-03-28 ENCOUNTER — Encounter: Payer: Self-pay | Admitting: Family Medicine

## 2018-03-28 NOTE — Telephone Encounter (Signed)
Pt has been scheduled to see Jory Sims, NP, 04/01/18. Pt thanked me for the call.

## 2018-03-31 ENCOUNTER — Encounter: Payer: Self-pay | Admitting: Family Medicine

## 2018-03-31 DIAGNOSIS — F419 Anxiety disorder, unspecified: Secondary | ICD-10-CM

## 2018-04-01 ENCOUNTER — Ambulatory Visit: Payer: 59 | Admitting: Adult Health

## 2018-04-01 MED ORDER — CILOSTAZOL 50 MG PO TABS: 50.0000 mg | ORAL_TABLET | Freq: Two times a day (BID) | ORAL | 0 refills | Status: DC

## 2018-04-01 MED ORDER — PROPRANOLOL HCL 10 MG PO TABS: 10.0000 mg | ORAL_TABLET | Freq: Three times a day (TID) | ORAL | 2 refills | Status: DC

## 2018-04-01 MED FILL — PROPRANOLOL 10 MG TABLET: 10 | 30 days supply | Qty: 90 | Fill #0

## 2018-04-01 MED FILL — CILOSTAZOL 50 MG TABLET: 50 | 30 days supply | Qty: 60 | Fill #0

## 2018-04-03 ENCOUNTER — Ambulatory Visit (INDEPENDENT_AMBULATORY_CARE_PROVIDER_SITE_OTHER): Payer: 59 | Admitting: Internal Medicine

## 2018-04-03 ENCOUNTER — Encounter: Payer: Self-pay | Admitting: Internal Medicine

## 2018-04-03 VITALS — BP 118/62 | HR 65 | Ht 69.0 in | Wt 172.6 lb

## 2018-04-03 DIAGNOSIS — I493 Ventricular premature depolarization: Secondary | ICD-10-CM

## 2018-04-03 DIAGNOSIS — Z01810 Encounter for preprocedural cardiovascular examination: Secondary | ICD-10-CM

## 2018-04-03 NOTE — Patient Instructions (Signed)
Medication Instructions:  Continue current medication  If you need a refill on your cardiac medications before your next appointment, please call your pharmacy.   Testing/Procedures: Your physician has requested that you have an echocardiogram. Echocardiography is a painless test that uses sound waves to create images of your heart. It provides your doctor with information about the size and shape of your heart and how well your heart's chambers and valves are working. This procedure takes approximately one hour. There are no restrictions for this procedure. -- done @ [address] - [address]rd Floor  Follow-Up: At [facility], you and your health needs are our priority.  As part of our continuing mission to provide you with exceptional heart care, we have created designated Provider Care Teams.  These Care Teams include your primary Cardiologist (physician) and Advanced Practice Providers (APPs -  Physician Assistants and Nurse Practitioners) who all work together to provide you with the care you need, when you need it. You will need a follow up appointment with Dr. Debara Pickett after your test is completed

## 2018-04-03 NOTE — Progress Notes (Signed)
OFFICE NOTE  Chief Complaint:  Preoperative clearance  Primary Care Physician: Briscoe Deutscher, DO  HPI:  [name] is a 52 y.o. male who presents at the request of his vascular surgeon for coronary evaluation. He recently was noted to have claudication of lower extremities and underwent ABIs which indicated a reduced ABI of 0.8 on the right and TBI of 0.47. 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 He subsequently underwent right common iliac artery stenting. Afterwards he noted marked improvement in his right leg pain. Subsequent to that he had an acute presentation of right leg pain and was again brought back for angiography on 05/11/2016. The previously placed common iliac stent was occluded and then retreated. No obvious etiology for this was found. It was thought that a cardio embolic episode could be responsible for this, however in my experience this is extremely rare for clot to travel from the ventricle and large and a patent stent in the common iliac artery. What would be more likely could be edge dissection or perhaps underexpansion of the stent or even noncompliance with antiplatelet medications. A clotting disorder has been postulated however seems unlikely given his age and the fact that he's had no prior clotting history, however it should be considered. In addition he's never had a cholesterol test and likely has some elevated cholesterol. From a coronary standpoint he reports after being questioned that he has recently had some chest discomfort, worsening shortness of breath and fatigue which he seemed to pass off to other issues. He is a smoker and has not yet been able to quit. He does seem to be interested in smoking cessation. He also reported that he recently traumatized his right knee and is wearing a brace. Findings indicate that he might need to have total knee  replacement in the near future.  06/27/2016  [name] returns today for follow-up of his studies. He underwent a nuclear stress test which was low risk and negative for ischemia however showed a reduced EF of 44%. A follow-up echo showed normal LV function and borderline diastolic dysfunction, suggesting that the EF on his nuclear stress test was abnormal due to likely gating abnormality. He did undergo additional blood work including workup for coagulation disorder. Ultimately, this did show an abnormal beta 2 glycoprotein IgG suggesting antiphospholipid antibodies. Lipid profile showed total cholesterol 149 contrast was 159, HDL 28 and LDL-C and 89. He is not on statin therapy although goal LDL cholesterol is less than 70.  04/03/2018  [name] is seen today for preoperative risk assessment.  He was last seen in February 2018 after follow-up of nuclear stress testing which was negative for ischemia however did showed a reduced EF of 44%.  Subsequently an echocardiogram showed normal LV function.  Since then he is done well but unfortunately struggling with neck pain.  He had prior neck surgery and now will have an additional neck surgery by Dr. Trenton Gammon.  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  He will need to hold his warfarin with bridging prior to surgery.  From a cardiac standpoint he denies chest pain or worsening shortness of breath, however his EKG today is abnormal demonstrating sinus rhythm with frequent PVCs in a pattern of bigeminy and rate of 65.  Reports no palpitations.  PMHx:  Past Medical History:  Diagnosis Date  . Anticoagulated on Coumadin   . Antiphospholipid antibody  positive 06/27/2016   followed by dr Irene Limbo ([facility])  . Anxiety with depression 06/24/2014  . Chronic back pain   . ED (erectile dysfunction)   . GERD (gastroesophageal reflux disease)   . Hemorrhoids, internal, with bleeding   . Hiatal hernia   . History of adenomatous  polyp of colon    07-08-2016  tubular adenoma's  . History of atrial fibrillation    episode 04/ 2018  . History of DVT of lower extremity   . Hyperlipidemia   . Iron deficiency anemia due to chronic blood loss followed by dr Irene Limbo ([facility])   from hemorrhoids and on coumadin-- hx Iron infusion's and blood transfusion's x2 07-09-2016  . Neuropathy, peripheral    right foot numbness from lumbar pinched nerve  . OA (osteoarthritis)   . PAD (peripheral artery disease) ([place]) followed by dr Bridgett Larsson--  per LOV note ABI 11-14-2016  widely patent stent/  11-27-2016 per pt no claudication symptoms   hx right CIA angioplasty and stent 02-24-2016/  right CIA thromectomy and patch angioplasty for restenosis 07-18-2015  . Primary hypercoagulable state ([place]) [D68.59] 09/15/2016  . Recovering alcoholic in remission ([place])    per pt since 04-25-2016  . S/P insertion of iliac artery stent    02-23-2017  right CIA PTA and stent/  05-11-2016  in-stent restenosis  s/p  thrombectomy/  07-17-2016 thrombectomy and  patch angioplasty right femoral artery   . Smokers' cough ([place])   . Wears glasses     Past Surgical History:  Procedure Laterality Date  . ANGIOPLASTY ILLIAC ARTERY Right 07/17/2016   Procedure: ANGIOPLASTY RIGHT COMMON ILIAC ARTERY;  Surgeon: Conrad Kelley, MD;  Location: [facility];  Service: Vascular;  Laterality: Right;  . ANTERIOR CERVICAL DECOMP/DISCECTOMY FUSION  07/21/2010   C5 -- C7  . CARDIOVASCULAR STRESS TEST  06-14-2016   dr hilty   normal perfusion study w/ no reversible ischemia/  stress ef 44% but visually looks better , echo ordered (LVEF 30-44%)  , normal LV wall motion   . COLONOSCOPY N/A 07/08/2016   Procedure: COLONOSCOPY;  Surgeon: Clarene Essex, MD;  Location: [facility];  Service: Endoscopy;  Laterality: N/A;  . ESOPHAGOGASTRODUODENOSCOPY N/A 07/08/2016   Procedure: ESOPHAGOGASTRODUODENOSCOPY (EGD);  Surgeon: Clarene Essex, MD;  Location: [facility];  Service: Endoscopy;   Laterality: N/A;  . GROIN DEBRIDEMENT Right 07/25/2016   Procedure: EVACUATION HEMATOMA RIGHT GROIN;  Surgeon: Waynetta Sandy, MD;  Location: [facility];  Service: Vascular;  Laterality: Right;  . HEMORRHOID SURGERY  2013  approx.   ?lanced/ banding  . HEMORRHOID SURGERY N/A 12/07/2016   Procedure: 3 COLUMN HEMORRHOIDECTOMY;  Surgeon: Leighton Ruff, MD;  Location: [facility];  Service: General;  Laterality: N/A;  . INTRAOPERATIVE ARTERIOGRAM Right 07/17/2016   Procedure: INTRA OPERATIVE ANGIOGRAM OF RIGHT COMMON ILIAC ARTERY;  Surgeon: Conrad Keokea, MD;  Location: [facility];  Service: Vascular;  Laterality: Right;  . KNEE SURGERY Right 1983  . PATCH ANGIOPLASTY Right 07/17/2016   Procedure: PATCH ANGIOPLASTY RIGHT FEMORAL ARTERY USING Rueben Bash BIOLOGIC PATCH;  Surgeon: Conrad Gillett Grove, MD;  Location: [facility];  Service: Vascular;  Laterality: Right;  . PERIPHERAL VASCULAR CATHETERIZATION N/A 02/24/2016   Procedure: Abdominal Aortogram w/Lower Extremity;  Surgeon: Conrad Spencerville, MD;  Location: [facility];  Service: Cardiovascular;  Laterality: N/A;  . PERIPHERAL VASCULAR CATHETERIZATION Right 02/24/2016   Procedure: Peripheral Vascular Intervention;  Surgeon: Conrad Gilbert, MD;  Location: [facility];  Service: Cardiovascular;  Laterality: Right;  Common iliac  . PERIPHERAL VASCULAR CATHETERIZATION N/A 05/11/2016   Procedure: Abdominal Aortogram;  Surgeon: Conrad Winters, MD;  Location: [facility];  Service: Cardiovascular;  Laterality: N/A;  . PERIPHERAL VASCULAR CATHETERIZATION N/A 05/11/2016   Procedure: Lower Extremity Angiography;  Surgeon: Conrad Varina, MD;  Location: [facility];  Service: Cardiovascular;  Laterality: N/A;  . THROMBECTOMY FEMORAL ARTERY Right 07/17/2016   Procedure: THROMBECTOMY RIGHT FEMORAL ARTERY;  Surgeon: Conrad Huntersville, MD;  Location: [facility];  Service: Vascular;  Laterality: Right;  . TRANSTHORACIC ECHOCARDIOGRAM  06-20-2016   dr hilty    ef 55-60%,  grade 1 diastolic dysfunction/  mild TR    FAMHx:  Family History  Problem Relation Age of Onset  . Cancer Father        Lung    SOCHx:   reports that he has been smoking cigarettes and e-cigarettes. He has a 33.00 pack-year smoking history. He has never used smokeless tobacco. He reports that he does not drink alcohol or use drugs.  ALLERGIES:  Allergies  Allergen Reactions  . Cymbalta [Duloxetine Hcl] Other (See Comments)    Headaches    ROS: Pertinent items noted in HPI and remainder of comprehensive ROS otherwise negative.  HOME MEDS: Current Outpatient Medications on File Prior to Visit  Medication Sig Dispense Refill  . cilostazol (PLETAL) 50 MG tablet Take 1 tablet (50 mg total) by mouth 2 (two) times daily. 60 tablet 0  . esomeprazole (NEXIUM) 20 MG capsule Take 20 mg by mouth daily at 12 noon.    . gabapentin (NEURONTIN) 600 MG tablet Take 1 tablet (600 mg total) by mouth 3 (three) times daily. 270 tablet 3  . [START ON 04/10/2018] oxyCODONE-acetaminophen (PERCOCET) 10-325 MG tablet Take 1 tablet by mouth every 8 (eight) hours as needed for pain. 90 tablet 0  . propranolol (INDERAL) 10 MG tablet Take 1 tablet (10 mg total) by mouth 3 (three) times daily. 90 tablet 2  . rosuvastatin (CRESTOR) 5 MG tablet Take 1 tablet (5 mg total) by mouth daily. 90 tablet 2  . sildenafil (VIAGRA) 25 MG tablet 2-3 tabs po x 1. Take 30 minutes to 4 hours prior to sexual activity. 21 tablet 2  . warfarin (COUMADIN) 1 MG tablet Take 1 tablet (1 mg total) by mouth daily. (Patient taking differently: Take 7 mg by mouth daily. ) 30 tablet 1  . warfarin (COUMADIN) 6 MG tablet Take 1 tablet (6 mg total) by mouth daily. 35 tablet 3   No current facility-administered medications on file prior to visit.     LABS/IMAGING: No results found for this or any previous visit (from the past 48 hour(s)). No results found.  WEIGHTS: Wt Readings from Last 3 Encounters:  04/03/18 172 lb 9.6  oz (78.3 kg)  02/08/18 176 lb 9.6 oz (80.1 kg)  11/19/17 175 lb 3.2 oz (79.5 kg)    VITALS: BP 118/62   Pulse 65   Ht 5\' 9"  (1.753 m)   Wt 172 lb 9.6 oz (78.3 kg)   BMI 25.49 kg/m   EXAM: General appearance: alert and no distress Neck: no carotid bruit, no JVD and thyroid not enlarged, symmetric, no tenderness/mass/nodules Lungs: clear to auscultation bilaterally Heart: regularly irregular rhythm Abdomen: soft, non-tender; bowel sounds normal; no masses,  no organomegaly Extremities: extremities normal, atraumatic, no cyanosis or edema Pulses: 2+ and symmetric Skin: Skin color, texture, turgor normal. No  rashes or lesions Neurologic: Grossly normal Psych: Pleasant  EKG: Sinus rhythm with frequent PVCs in a pattern of bigeminy at 67-personally reviewed  ASSESSMENT: 1. Indeterminate risk for upcoming surgery 2. Newly identified ventricular bigeminy 3. 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 Dyslipidemia 5. PAD with lifestyle limiting claudication status post right CIA stent 6. 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 Chest pain, progressive dyspnea and fatigue 8. Right knee injury 9. Tobacco abuse  PLAN: 1.   Mr. Birdsell has newly identified ventricular bigeminy, but denies any ischemic symptoms and had a negative stress test and normal LV function by echo a little over a year ago.  I suspect these are not pathologic however would like to rule out any new structural heart changes prior to surgery.  I recommend a repeat echocardiogram.  He is on low-dose propranolol mostly for anxiety, but may benefit from a switch to a more selective beta-blocker to reduce cardiovascular risk and try to suppress his PVCs.  Plan follow-up with me afterwards.  Pixie Casino, MD, [facility] [place] Director of the Advanced Lipid Disorders &  Cardiovascular Risk Reduction Clinic Diplomate of the American Board of Clinical Lipidology Attending  Cardiologist  Direct Dial: [phone]  Fax: [phone]  Website:  [email or web address] 04/03/2018, 4:26 PM

## 2018-04-04 ENCOUNTER — Telehealth: Payer: Self-pay

## 2018-04-04 NOTE — Telephone Encounter (Signed)
We received fax from attorney wanting FCE. Called patient let him know we do not do in our office. He will call them and let find out if they would like to send to someone for that or if they want [place] to start referral. I have put in book under patients name until we hear from him.

## 2018-04-07 MED FILL — WARFARIN SODIUM 6 MG TABLET: 6 | 35 days supply | Qty: 35 | Fill #2

## 2018-04-11 NOTE — Progress Notes (Signed)
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  HISTORY OF PRESENTING ILLNESS:   [name] is a wonderful 52 y.o. 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  Patient has a history of smoking 1-1/2 packs per day for about 40 years, dyslipidemia but no other known significant chronic medical comorbidities. He has not had a primary care physician and routine screenings.  He presented to the vascular surgeon in September/October 2017 with claudication of his lower extremities especially cramping discomfort in his buttocks and right thigh and was noted to have a reduced ABI of 0.8 on the right side. 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 He underwent right common iliac artery stenting with significant improvement in his right lower extremity claudication symptoms. He was apparently placed on Plavix at the time.  He presented again in late December 2017 with acute worsening of his right lower extremity pain and had repeat angiography on 05/11/2016 which showed the previously placed common iliac stent was occluded and this was  retreated. No obvious etiology was noted.  Patient was started on aspirin in addition to Plavix. Concern for a clotting disorder versus incomplete stent expansion versus edge dissection were entertained. Patient had anticardiolipin antibody and beta-2 glycoprotein antibody testing. He was noted to have a significantly elevated beta-2 glycoprotein IgG that was concerning for antiphospholipid antibody syndrome.  Patient was tentatively plan to have arterial bypass surgery on 07/11/2016 with Dr. Bridgett Larsson. He has been seen by Dr. Debara Pickett and had a nuclear cardiac stress test which showed a slightly reduced ejection fraction of 44% but negative for ischemia. Repeat echo showed normal ejection fraction.  Patient has had some mild anemia with hemoglobin of 10.5 when he first had his cardiac angiogram of 10.2 on the subsequent angiogram. He does not report any overt bleeding on his aspirin plus Plavix. Does note some mild hemorrhoidal bleeding on and off for the last 1 year.  INTERVAL HISTORY  [name] is here for his scheduled followup for antiphospholipid antibody syndrome. The patient's last visit with [place] was on 10/23/16. He is accompanied today by his wife. The pt reports that he is doing well overall.  The pt reports that he received an iron infusion on 02/28/18. He notes that he had a hemorrhoidectomy in the past year, which has resolved his bleeding. He denies observing any blood in the stools, black stools, gum bleeds, nose bleeds, and any other concerns for bleeding. The pt notes that he has pursued a colonoscopy and endoscopy which did not elucidate any overt blood loss. He attempted a capsule endoscopy but notes that the camera failed and the procedure has not yet been repeated.   The pt notes that he has received IV Iron about every 2 months in the last year, sometimes after 4-5 months.  The pt has not developed any concerns for blood clots in the last year and has continued on coumadin.  Lab  results today (04/12/18) of CBC w/diff, CMP, and Reticulocytes is as follows: all values are WNL except for WBC at 14.3k, ANC at 9.6k, Eosinophils abs at 900, Glucose at 102. 04/12/18 Iron and TIBC revealed all values WNL except for 17% Saturation ratio 04/12/18 Ferritin at 99 04/12/18 POCT INR is WNL at 2.8  On review of systems, pt reports good energy levels, and denies blood in the stools, black stools, gum bleeds, nose bleeds, any other concerns for bleeding, unexpected weight loss, leg swelling, color changes in lower extremities, neuropathy, and any other symptoms.    MEDICAL HISTORY:  Past Medical History:  Diagnosis Date  . Anticoagulated on Coumadin   . Antiphospholipid antibody positive 06/27/2016   followed by dr Jaxzen Vanhorn ([facility])  . Anxiety with depression 06/24/2014  . Chronic back pain   . ED (erectile dysfunction)   . GERD (gastroesophageal reflux disease)   . Hemorrhoids, internal, with bleeding   . Hiatal hernia   . History of adenomatous polyp of colon    07-08-2016  tubular adenoma's  . History of atrial fibrillation    episode 04/ 2018  . History of DVT of lower extremity   . Hyperlipidemia   . Iron deficiency anemia due to chronic blood loss followed by dr Irene Limbo ([facility])   from hemorrhoids and on coumadin-- hx Iron infusion's and blood transfusion's x2 07-09-2016  . Neuropathy, peripheral    right foot numbness from lumbar pinched nerve  . OA (osteoarthritis)   . PAD (peripheral artery disease) ([place]) followed by dr Bridgett Larsson--  per LOV note ABI 11-14-2016  widely patent stent/  11-27-2016 per pt no claudication symptoms   hx right CIA angioplasty and stent 02-24-2016/  right CIA thromectomy and patch angioplasty for restenosis 07-18-2015  . Primary hypercoagulable state ([place]) [D68.59] 09/15/2016  . Recovering alcoholic in remission ([place])    per pt since 04-25-2016  . S/P insertion of iliac artery stent    02-23-2017  right CIA PTA and stent/   05-11-2016  in-stent restenosis  s/p  thrombectomy/  07-17-2016 thrombectomy and  patch angioplasty right femoral artery   . Smokers' cough ([place])   . Wears glasses     SURGICAL HISTORY: Past Surgical History:  Procedure Laterality Date  . ANGIOPLASTY ILLIAC ARTERY Right 07/17/2016   Procedure: ANGIOPLASTY RIGHT COMMON ILIAC ARTERY;  Surgeon: Conrad [place], MD;  Location: [place];  Service: Vascular;  Laterality: Right;  . ANTERIOR CERVICAL DECOMP/DISCECTOMY FUSION  07/21/2010   C5 -- C7  . CARDIOVASCULAR STRESS TEST  06-14-2016   dr hilty   normal perfusion study w/ no reversible ischemia/  stress ef 44% but visually looks better , echo ordered (LVEF 30-44%)  , normal LV wall motion   . COLONOSCOPY N/A 07/08/2016   Procedure: COLONOSCOPY;  Surgeon: Clarene Essex, MD;  Location: [facility];  Service: Endoscopy;  Laterality: N/A;  . ESOPHAGOGASTRODUODENOSCOPY N/A 07/08/2016   Procedure: ESOPHAGOGASTRODUODENOSCOPY (EGD);  Surgeon: Clarene Essex, MD;  Location: [facility];  Service: Endoscopy;  Laterality: N/A;  . GROIN DEBRIDEMENT Right 07/25/2016   Procedure: EVACUATION HEMATOMA RIGHT GROIN;  Surgeon: Waynetta Sandy, MD;  Location: [place];  Service: Vascular;  Laterality: Right;  . HEMORRHOID SURGERY  2013  approx.   ?lanced/ banding  . HEMORRHOID SURGERY N/A 12/07/2016   Procedure: 3 COLUMN HEMORRHOIDECTOMY;  Surgeon: Marcello Moores,  Elmo Putt, MD;  Location: [facility];  Service: General;  Laterality: N/A;  . INTRAOPERATIVE ARTERIOGRAM Right 07/17/2016   Procedure: INTRA OPERATIVE ANGIOGRAM OF RIGHT COMMON ILIAC ARTERY;  Surgeon: Conrad Terrell, MD;  Location: [place];  Service: Vascular;  Laterality: Right;  . KNEE SURGERY Right 1983  . PATCH ANGIOPLASTY Right 07/17/2016   Procedure: PATCH ANGIOPLASTY RIGHT FEMORAL ARTERY USING Rueben Bash BIOLOGIC PATCH;  Surgeon: Conrad [place], MD;  Location: [place];  Service: Vascular;  Laterality: Right;  . PERIPHERAL VASCULAR CATHETERIZATION N/A 02/24/2016     Procedure: Abdominal Aortogram w/Lower Extremity;  Surgeon: Conrad [place], MD;  Location: [place] [facility];  Service: Cardiovascular;  Laterality: N/A;  . PERIPHERAL VASCULAR CATHETERIZATION Right 02/24/2016   Procedure: Peripheral Vascular Intervention;  Surgeon: Conrad Gordon, MD;  Location: [place] [facility];  Service: Cardiovascular;  Laterality: Right;  Common iliac  . PERIPHERAL VASCULAR CATHETERIZATION N/A 05/11/2016   Procedure: Abdominal Aortogram;  Surgeon: Conrad [place], MD;  Location: [place] [facility];  Service: Cardiovascular;  Laterality: N/A;  . PERIPHERAL VASCULAR CATHETERIZATION N/A 05/11/2016   Procedure: Lower Extremity Angiography;  Surgeon: Conrad [place], MD;  Location: [place] [facility];  Service: Cardiovascular;  Laterality: N/A;  . THROMBECTOMY FEMORAL ARTERY Right 07/17/2016   Procedure: THROMBECTOMY RIGHT FEMORAL ARTERY;  Surgeon: Conrad Clayton, MD;  Location: [place];  Service: Vascular;  Laterality: Right;  . TRANSTHORACIC ECHOCARDIOGRAM  06-20-2016   dr hilty   ef 55-60%,  grade 1 diastolic dysfunction/  mild TR    SOCIAL HISTORY: Social History   Socioeconomic History  . Marital status: Married    Spouse name: Not on file  . Number of children: Not on file  . Years of education: Not on file  . Highest education level: Not on file  Occupational History  . Not on file  Social Needs  . Financial resource strain: Not on file  . Food insecurity:    Worry: Not on file    Inability: Not on file  . Transportation needs:    Medical: Not on file    Non-medical: Not on file  Tobacco Use  . Smoking status: Current Every Day Smoker    Packs/day: 1.00    Years: 33.00    Pack years: 33.00    Types: Cigarettes, E-cigarettes  . Smokeless tobacco: Never Used  Substance and Sexual Activity  . Alcohol use: No    Comment: per pt recovering alcoholic in remission since 04-25-2016  . Drug use: No  . Sexual activity: Yes  Lifestyle  . Physical activity:    Days per  week: Not on file    Minutes per session: Not on file  . Stress: Not on file  Relationships  . Social connections:    Talks on phone: Not on file    Gets together: Not on file    Attends religious service: Not on file    Active member of club or organization: Not on file    Attends meetings of clubs or organizations: Not on file    Relationship status: Not on file  . Intimate partner violence:    Fear of current or ex partner: Not on file    Emotionally abused: Not on file    Physically abused: Not on file    Forced sexual activity: Not on file  Other Topics Concern  . Not on file  Social History Narrative  . Not on file  FAMILY HISTORY: Family History  Problem Relation Age of Onset  . Cancer Father        Lung    ALLERGIES:  is allergic to cymbalta [duloxetine hcl].  MEDICATIONS:  Current Outpatient Medications  Medication Sig Dispense Refill  . cilostazol (PLETAL) 50 MG tablet Take 1 tablet (50 mg total) by mouth 2 (two) times daily. 60 tablet 0  . esomeprazole (NEXIUM) 20 MG capsule Take 20 mg by mouth daily at 12 noon.    . gabapentin (NEURONTIN) 600 MG tablet Take 1 tablet (600 mg total) by mouth 3 (three) times daily. 270 tablet 3  . oxyCODONE-acetaminophen (PERCOCET) 10-325 MG tablet Take 1 tablet by mouth every 8 (eight) hours as needed for pain. 90 tablet 0  . propranolol (INDERAL) 10 MG tablet Take 1 tablet (10 mg total) by mouth 3 (three) times daily. 90 tablet 2  . rosuvastatin (CRESTOR) 5 MG tablet Take 1 tablet (5 mg total) by mouth daily. 90 tablet 2  . sildenafil (VIAGRA) 25 MG tablet 2-3 tabs po x 1. Take 30 minutes to 4 hours prior to sexual activity. 21 tablet 2  . warfarin (COUMADIN) 1 MG tablet Take 1 tablet (1 mg total) by mouth daily. (Patient taking differently: Take 7 mg by mouth daily. ) 30 tablet 1  . warfarin (COUMADIN) 6 MG tablet Take 1 tablet (6 mg total) by mouth daily. 35 tablet 3   No current facility-administered medications for this  visit.     REVIEW OF SYSTEMS:    A 10+ POINT REVIEW OF SYSTEMS WAS OBTAINED including neurology, dermatology, psychiatry, cardiac, respiratory, lymph, extremities, GI, GU, Musculoskeletal, constitutional, breasts, reproductive, HEENT.  All pertinent positives are noted in the HPI.  All others are negative.   PHYSICAL EXAMINATION: ECOG PERFORMANCE STATUS: 1 - Symptomatic but completely ambulatory  . Vitals:   04/12/18 1114  BP: 117/65  Pulse: 66  Resp: 18  Temp: 98.1 F (36.7 C)  SpO2: 100%   Filed Weights   04/12/18 1114  Weight: 171 lb 8 oz (77.8 kg)   .Body mass index is 25.33 kg/m.  GENERAL:alert, in no acute distress and comfortable SKIN: no acute rashes, no significant lesions EYES: conjunctiva are pink and non-injected, sclera anicteric OROPHARYNX: MMM, no exudates, no oropharyngeal erythema or ulceration NECK: supple, no JVD LYMPH:  no palpable lymphadenopathy in the cervical, axillary or inguinal regions LUNGS: clear to auscultation b/l with normal respiratory effort HEART: regular rate & rhythm ABDOMEN:  normoactive bowel sounds , non tender, not distended. No palpable hepatosplenomegaly.  Extremity: no pedal edema PSYCH: alert & oriented x 3 with fluent speech NEURO: no focal motor/sensory deficits   LABORATORY DATA:  I have reviewed the data as listed  .Marland Kitchen CBC Latest Ref Rng & Units 04/12/2018 02/08/2018 11/19/2017  WBC 4.0 - 10.5 K/uL 14.3(H) 15.1(H) 12.1(H)  Hemoglobin 13.0 - 17.0 g/dL 16.4 14.9 16.5  Hematocrit 39.0 - 52.0 % 48.3 42.7 48.1  Platelets 150 - 400 K/uL 216 216 216.0    . CMP Latest Ref Rng & Units 04/12/2018 02/08/2018 11/19/2017  Glucose 70 - 99 mg/dL 102(H) 89 88  BUN 6 - 20 mg/dL 9 8 8   Creatinine 0.61 - 1.24 mg/dL 0.84 0.85 0.88  Sodium 135 - 145 mmol/L 140 137 139  Potassium 3.5 - 5.1 mmol/L 4.3 4.3 4.2  Chloride 98 - 111 mmol/L 107 105 105  CO2 22 - 32 mmol/L 24 22 26   Calcium 8.9 - 10.3 mg/dL 9.5 9.2 9.4  Total Protein 6.5 - 8.1  g/dL 7.3 6.6 6.7  Total Bilirubin 0.3 - 1.2 mg/dL 0.7 0.7 0.9  Alkaline Phos 38 - 126 U/L 97 - 82  AST 15 - 41 U/L 22 16 17   ALT 0 - 44 U/L 33 19 21   Component     Latest Ref Rng & Units 06/20/2016         8:37 AM  Phosphatidylserine IgG Autoantibodies     <10 U/mL <10  Phosphatydalserine, IgM     <25 U/mL <25  Phosphatydalserine, IgA     <20 U/mL <20  Beta-2 Glycoprotein I Ab, IgG     <=20 SGU 150 (H)  Beta-2-Glycoprotein I IgM     <=20 SMU 10  Beta-2-Glycoprotein I IgA     <=20 SAU 11  Anticardiolipin Ab,IgA,Qn     APL <11  Anticardiolipin Ab,IgG,Qn     GPL <14  Anticardiolipin Ab,IgM,Qn     MPL <12  Result      REPORT  Interpretation      REPORT  Reviewer      REPORT  Interpretation        Reviewer        Act.Prt.C Resist.     >=2.1 ratio 4.3  Antithrombin Activity     80 - 120 % activity 118   Component     Latest Ref Rng & Units 10/23/2016  PTT-LA     0.0 - 51.9 sec 43.3  DRVVT     0.0 - 47.0 sec 70.0 (H)  dRVVT Mix     0.0 - 47.0 sec 44.4  Lupus Anticoag Interp      Comment:  Beta-2 Glycoprotein I Ab, IgG     0 - 20 GPI IgG units 46 (H)  Beta-2 Glyco 1 IgA     0 - 25 GPI IgA units <9  Beta-2 Glyco 1 IgM     0 - 32 GPI IgM units <9  Anticardiolipin Ab,IgG,Qn     0 - 14 GPL U/mL <9  Anticardiolipin Ab,IgM,Qn     0 - 12 MPL U/mL <9  Anticardiolipin Ab,IgA,Qn     0 - 11 APL U/mL <9   . Lab Results  Component Value Date   IRON 52 04/12/2018   TIBC 305 04/12/2018   IRONPCTSAT 17 (L) 04/12/2018   (Iron and TIBC)  Lab Results  Component Value Date   FERRITIN 99 04/12/2018    RADIOGRAPHIC STUDIES: I have personally reviewed the radiological images as listed and agreed with the findings in the report. No results found.  ASSESSMENT & PLAN:   52 y.o. gentleman with history of dyslipidemia and chronic smoker with  1) Rt common Iliac artery thrombosis. This was initially noted to be related to an arterial dissection. Other risk factors  at baseline include strong history of smoking.  Patient's right common iliac artery was stented and he had a repeat thrombosis while on Plavix noted on 05/11/2016. Aspirin was added at this time. Patient notes that he still having right buttock and thigh claudications and some coldness and paresthesias intermittently in his right foot but that these are much improved.  Noted to have elevated beta-2 glycoprotein IgG with titers that were significantly elevated at 150 Rpt titers lower at 46 but still significant  2) Elevated beta-2 glycoprotein IgG - cannot r/o antiphospholipid antibody syndrome as a result of this. Rpt titers lower at 46 but still significant Given his presentation - there is enough concern that we  would need to treat this as APLA syndrome.  3) Iron deficiency anemia.  Unclear source of blood loss.  History of hemorrhoid blood loss, now s/p hemorrhoidectomy Recent EGD and colonoscopy did not show any overt signs of bleeding or concerning lesions. He was cleared by gastroenterology for ongoing anticoagulation. Hgb is stable.  PLAN:   -Discussed pt labwork today, 04/12/18; stable neutrophilia at 9.6k, no anemia with HGB at 16.4, 17% saturation ratio however Ferritin is at 99. INR is within normal limits at 2.8.  -antiparietal cell and anti IF ab neg -Labs today are completed 6 weeks after the patient's last IV Iron completed on 02/28/18 -Counts are not dropping quickly, which is reassuring -As the pt is on Coumadin, do suspect minimal, slow blood loss in the GI track, however given patient's clot history, continuing long-term anticoagulation with Coumadin is preferable  -Recommend pt follow with GI for completing capsule endoscopy or a balloon enteroscopy. Pt has completed endoscopy and colonoscopy already.  -Would also like to rule out absorption issues including anti-parietal cell antibodies or anti-intrinsic factor antibodies  -Would recommend PCP Dr. Briscoe Deutscher replace  iron as needed to keep Ferritin >100 -Recommend ruling occult hematuria with PCP urine test -Continue follow-up with PCP Dr. Briscoe Deutscher for Coumadin management .  -Will see the pt back as needed    RTC with Dr Irene Limbo as needed  -additional labs today (if cannot be added on)   All of the patients questions were answered with apparent satisfaction. The patient knows to call the clinic with any problems, questions or concerns.  The total time spent in the appt was 20 minutes and more than 50% was on counseling and direct patient cares.    Sullivan Lone MD [place] AAHIVMS [facility] Hematology/Oncology Physician [facility]  (Office):       [phone] (Work cell):  [phone] (Fax):           [phone]  I, Baldwin Jamaica, am acting as a scribe for Dr. Sullivan Lone.   .I have reviewed the above documentation for accuracy and completeness, and I agree with the above. Brunetta Genera MD

## 2018-04-12 ENCOUNTER — Inpatient Hospital Stay: Payer: 59

## 2018-04-12 ENCOUNTER — Ambulatory Visit (HOSPITAL_COMMUNITY): Payer: 59 | Attending: Cardiology

## 2018-04-12 ENCOUNTER — Inpatient Hospital Stay: Payer: 59 | Attending: Hematology | Admitting: Hematology

## 2018-04-12 ENCOUNTER — Other Ambulatory Visit: Payer: Self-pay

## 2018-04-12 ENCOUNTER — Ambulatory Visit (INDEPENDENT_AMBULATORY_CARE_PROVIDER_SITE_OTHER): Payer: 59 | Admitting: General Practice

## 2018-04-12 VITALS — BP 117/65 | HR 66 | Temp 98.1°F | Resp 18 | Ht 69.0 in | Wt 171.5 lb

## 2018-04-12 DIAGNOSIS — Z01810 Encounter for preprocedural cardiovascular examination: Secondary | ICD-10-CM | POA: Diagnosis not present

## 2018-04-12 DIAGNOSIS — Z79899 Other long term (current) drug therapy: Secondary | ICD-10-CM | POA: Diagnosis not present

## 2018-04-12 DIAGNOSIS — E785 Hyperlipidemia, unspecified: Secondary | ICD-10-CM | POA: Insufficient documentation

## 2018-04-12 DIAGNOSIS — Z86718 Personal history of other venous thrombosis and embolism: Secondary | ICD-10-CM | POA: Insufficient documentation

## 2018-04-12 DIAGNOSIS — K219 Gastro-esophageal reflux disease without esophagitis: Secondary | ICD-10-CM | POA: Insufficient documentation

## 2018-04-12 DIAGNOSIS — R202 Paresthesia of skin: Secondary | ICD-10-CM | POA: Insufficient documentation

## 2018-04-12 DIAGNOSIS — F17210 Nicotine dependence, cigarettes, uncomplicated: Secondary | ICD-10-CM | POA: Insufficient documentation

## 2018-04-12 DIAGNOSIS — D6859 Other primary thrombophilia: Secondary | ICD-10-CM

## 2018-04-12 DIAGNOSIS — D500 Iron deficiency anemia secondary to blood loss (chronic): Secondary | ICD-10-CM

## 2018-04-12 DIAGNOSIS — Z7902 Long term (current) use of antithrombotics/antiplatelets: Secondary | ICD-10-CM | POA: Insufficient documentation

## 2018-04-12 DIAGNOSIS — I493 Ventricular premature depolarization: Secondary | ICD-10-CM | POA: Diagnosis not present

## 2018-04-12 DIAGNOSIS — Z7901 Long term (current) use of anticoagulants: Secondary | ICD-10-CM | POA: Diagnosis not present

## 2018-04-12 DIAGNOSIS — D509 Iron deficiency anemia, unspecified: Secondary | ICD-10-CM | POA: Insufficient documentation

## 2018-04-12 LAB — CMP (CANCER CENTER ONLY)
ALBUMIN: 3.8 g/dL (ref 3.5–5.0)
ALT: 33 U/L (ref 0–44)
ANION GAP: 9 (ref 5–15)
AST: 22 U/L (ref 15–41)
Alkaline Phosphatase: 97 U/L (ref 38–126)
BUN: 9 mg/dL (ref 6–20)
CO2: 24 mmol/L (ref 22–32)
Calcium: 9.5 mg/dL (ref 8.9–10.3)
Chloride: 107 mmol/L (ref 98–111)
Creatinine: 0.84 mg/dL (ref 0.61–1.24)
GFR, Est AFR Am: >60 mL/min (ref >60)
GFR, Estimated: >60 mL/min (ref >60)
GLUCOSE: 102 mg/dL — AB (ref 70–99)
POTASSIUM: 4.3 mmol/L (ref 3.5–5.1)
SODIUM: 140 mmol/L (ref 135–145)
TOTAL PROTEIN: 7.3 g/dL (ref 6.5–8.1)
Total Bilirubin: 0.7 mg/dL (ref 0.3–1.2)

## 2018-04-12 LAB — CBC WITH DIFFERENTIAL (CANCER CENTER ONLY)
Abs Immature Granulocytes: 0.04 10*3/uL (ref 0.00–0.07)
BASOS PCT: 1 %
Basophils Absolute: 0.1 10*3/uL (ref 0.0–0.1)
Eosinophils Absolute: 0.9 10*3/uL — ABNORMAL HIGH (ref 0.0–0.5)
Eosinophils Relative: 6 %
HCT: 48.3 % (ref 39.0–52.0)
Hemoglobin: 16.4 g/dL (ref 13.0–17.0)
Immature Granulocytes: 0 %
LYMPHS ABS: 3 10*3/uL (ref 0.7–4.0)
Lymphocytes Relative: 21 %
MCH: 33.3 pg (ref 26.0–34.0)
MCHC: 34 g/dL (ref 30.0–36.0)
MCV: 98 fL (ref 80.0–100.0)
MONOS PCT: 4 %
Monocytes Absolute: 0.6 10*3/uL (ref 0.1–1.0)
NEUTROS ABS: 9.6 10*3/uL — AB (ref 1.7–7.7)
Neutrophils Relative %: 68 %
PLATELETS: 216 10*3/uL (ref 150–400)
RBC: 4.93 MIL/uL (ref 4.22–5.81)
RDW: 12.4 % (ref 11.5–15.5)
WBC Count: 14.3 10*3/uL — ABNORMAL HIGH (ref 4.0–10.5)
nRBC: 0 % (ref 0.0–0.2)

## 2018-04-12 LAB — POCT INR: INR: 2.8 (ref 2.0–3.0)

## 2018-04-12 LAB — ECHOCARDIOGRAM COMPLETE
Height: 69 in
Weight: 2744 oz

## 2018-04-12 LAB — IRON AND TIBC
Iron: 52 ug/dL (ref 42–163)
SATURATION RATIOS: 17 % — AB (ref 20–55)
TIBC: 305 ug/dL (ref 202–409)
UIBC: 253 ug/dL (ref 117–376)

## 2018-04-12 LAB — FERRITIN: FERRITIN: 99 ng/mL (ref 24–336)

## 2018-04-12 NOTE — Patient Instructions (Addendum)
Pre visit review using our clinic review tool, if applicable. No additional management support is needed unless otherwise documented below in the visit note.  Continue to take 6 mg daily except 7 mg on Tuesday.  Re-check in 6 weeks.

## 2018-04-13 LAB — INTRINSIC FACTOR ANTIBODIES: Intrinsic Factor: 1 AU/mL (ref 0.0–1.1)

## 2018-04-13 NOTE — Progress Notes (Signed)
I have reviewed this visit and I agree on the patient's plan of dosage and recommendations. Rexton Greulich, DO   

## 2018-04-14 LAB — ANTI-PARIETAL ANTIBODY: Parietal Cell Antibody-IgG: 1.6 Units (ref 0.0–20.0)

## 2018-04-15 ENCOUNTER — Ambulatory Visit: Payer: 59

## 2018-04-17 MED ORDER — FENTANYL CITRATE (PF) 250 MCG/5ML IJ SOLN
INTRAMUSCULAR | Status: AC
  Filled 2018-04-17: qty 5

## 2018-04-17 MED ORDER — PROPOFOL 10 MG/ML IV BOLUS
INTRAVENOUS | Status: AC
  Filled 2018-04-17: qty 20

## 2018-04-19 MED FILL — OXYCODONE-APAP 10-325: 10-325 | 30 days supply | Qty: 90 | Fill #0

## 2018-04-22 MED FILL — SILDENAFIL CITRATE 25 MG TA: 25 | 90 days supply | Qty: 18 | Fill #2

## 2018-04-22 MED FILL — ROSUVASTATIN CALCIUM 5 MG T: 5 | 90 days supply | Qty: 90 | Fill #2

## 2018-05-03 ENCOUNTER — Ambulatory Visit (INDEPENDENT_AMBULATORY_CARE_PROVIDER_SITE_OTHER): Payer: 59 | Admitting: Internal Medicine

## 2018-05-03 ENCOUNTER — Encounter: Payer: Self-pay | Admitting: Internal Medicine

## 2018-05-03 ENCOUNTER — Ambulatory Visit: Payer: 59 | Admitting: Internal Medicine

## 2018-05-03 VITALS — BP 125/77 | HR 68 | Ht 69.0 in | Wt 169.8 lb

## 2018-05-03 DIAGNOSIS — R079 Chest pain, unspecified: Secondary | ICD-10-CM

## 2018-05-03 DIAGNOSIS — I739 Peripheral vascular disease, unspecified: Secondary | ICD-10-CM

## 2018-05-03 DIAGNOSIS — I429 Cardiomyopathy, unspecified: Secondary | ICD-10-CM | POA: Diagnosis not present

## 2018-05-03 MED ORDER — CARVEDILOL 3.125 MG PO TABS: 3.1250 mg | ORAL_TABLET | Freq: Two times a day (BID) | ORAL | 3 refills | Status: DC

## 2018-05-03 MED FILL — CARVEDILOL 3.125 MG TABLET: 3.125 | 90 days supply | Qty: 180 | Fill #0

## 2018-05-03 NOTE — Progress Notes (Signed)
OFFICE NOTE  Chief Complaint:  Follow-up echo  Primary Care Physician: Briscoe Deutscher, DO  HPI:  [name] is a 52 y.o. male who presents at the request of his vascular surgeon for coronary evaluation. He recently was noted to have claudication of lower extremities and underwent ABIs which indicated a reduced ABI of 0.8 on the right and TBI of 0.47. 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 He subsequently underwent right common iliac artery stenting. Afterwards he noted marked improvement in his right leg pain. Subsequent to that he had an acute presentation of right leg pain and was again brought back for angiography on 05/11/2016. The previously placed common iliac stent was occluded and then retreated. No obvious etiology for this was found. It was thought that a cardio embolic episode could be responsible for this, however in my experience this is extremely rare for clot to travel from the ventricle and large and a patent stent in the common iliac artery. What would be more likely could be edge dissection or perhaps underexpansion of the stent or even noncompliance with antiplatelet medications. A clotting disorder has been postulated however seems unlikely given his age and the fact that he's had no prior clotting history, however it should be considered. In addition he's never had a cholesterol test and likely has some elevated cholesterol. From a coronary standpoint he reports after being questioned that he has recently had some chest discomfort, worsening shortness of breath and fatigue which he seemed to pass off to other issues. He is a smoker and has not yet been able to quit. He does seem to be interested in smoking cessation. He also reported that he recently traumatized his right knee and is wearing a brace. Findings indicate that he might need to have total knee replacement  in the near future.  06/27/2016  [name] returns today for follow-up of his studies. He underwent a nuclear stress test which was low risk and negative for ischemia however showed a reduced EF of 44%. A follow-up echo showed normal LV function and borderline diastolic dysfunction, suggesting that the EF on his nuclear stress test was abnormal due to likely gating abnormality. He did undergo additional blood work including workup for coagulation disorder. Ultimately, this did show an abnormal beta 2 glycoprotein IgG suggesting antiphospholipid antibodies. Lipid profile showed total cholesterol 149 contrast was 159, HDL 28 and LDL-C and 89. He is not on statin therapy although goal LDL cholesterol is less than 70.  04/03/2018  [name] is seen today for preoperative risk assessment.  He was last seen in February 2018 after follow-up of nuclear stress testing which was negative for ischemia however did showed a reduced EF of 44%.  Subsequently an echocardiogram showed normal LV function.  Since then he is done well but unfortunately struggling with neck pain.  He had prior neck surgery and now will have an additional neck surgery by Dr. Trenton Gammon.  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  He will need to hold his warfarin with bridging prior to surgery.  From a cardiac standpoint he denies chest pain or worsening shortness of breath, however his EKG today is abnormal demonstrating sinus rhythm with frequent PVCs in a pattern of bigeminy and rate of 65.  Reports no palpitations.  05/03/2018  [name] is seen today for follow-up of his echo.  This demonstrated a reduced LVEF  of 40 to 45%.  Interestingly in the past a year ago his echo was normal despite reduced EF of 44% on nuclear stress testing.  The nuclear stress test was negative for ischemia.  He has been having some chest discomfort.  He thinks he is also aware of his PVCs which were noted in a bigeminal pattern.   We discussed the possibility that his cardiomyopathy may be related to frequent PVCs or perhaps he has an ischemic cardiomyopathy causing the PVCs.  He does have a history of PAD and was noted to have coronary artery calcifications (although mild) by CT scan of the chest in February 2019.  PMHx:  Past Medical History:  Diagnosis Date  . Anticoagulated on Coumadin   . Antiphospholipid antibody positive 06/27/2016   followed by dr kale ([facility])  . Anxiety with depression 06/24/2014  . Chronic back pain   . ED (erectile dysfunction)   . GERD (gastroesophageal reflux disease)   . Hemorrhoids, internal, with bleeding   . Hiatal hernia   . History of adenomatous polyp of colon    07-08-2016  tubular adenoma's  . History of atrial fibrillation    episode 04/ 2018  . History of DVT of lower extremity   . Hyperlipidemia   . Iron deficiency anemia due to chronic blood loss followed by dr Irene Limbo ([facility])   from hemorrhoids and on coumadin-- hx Iron infusion's and blood transfusion's x2 07-09-2016  . Neuropathy, peripheral    right foot numbness from lumbar pinched nerve  . OA (osteoarthritis)   . PAD (peripheral artery disease) ([place]) followed by dr Bridgett Larsson--  per LOV note ABI 11-14-2016  widely patent stent/  11-27-2016 per pt no claudication symptoms   hx right CIA angioplasty and stent 02-24-2016/  right CIA thromectomy and patch angioplasty for restenosis 07-18-2015  . Primary hypercoagulable state ([place]) [D68.59] 09/15/2016  . Recovering alcoholic in remission ([place])    per pt since 04-25-2016  . S/P insertion of iliac artery stent    02-23-2017  right CIA PTA and stent/  05-11-2016  in-stent restenosis  s/p  thrombectomy/  07-17-2016 thrombectomy and  patch angioplasty right femoral artery   . Smokers' cough ([place])   . Wears glasses     Past Surgical History:  Procedure Laterality Date  . ANGIOPLASTY ILLIAC ARTERY Right 07/17/2016   Procedure: ANGIOPLASTY RIGHT COMMON  ILIAC ARTERY;  Surgeon: Conrad [place], MD;  Location: [facility];  Service: Vascular;  Laterality: Right;  . ANTERIOR CERVICAL DECOMP/DISCECTOMY FUSION  07/21/2010   C5 -- C7  . CARDIOVASCULAR STRESS TEST  06-14-2016   dr hilty   normal perfusion study w/ no reversible ischemia/  stress ef 44% but visually looks better , echo ordered (LVEF 30-44%)  , normal LV wall motion   . COLONOSCOPY N/A 07/08/2016   Procedure: COLONOSCOPY;  Surgeon: Clarene Essex, MD;  Location: [facility];  Service: Endoscopy;  Laterality: N/A;  . ESOPHAGOGASTRODUODENOSCOPY N/A 07/08/2016   Procedure: ESOPHAGOGASTRODUODENOSCOPY (EGD);  Surgeon: Clarene Essex, MD;  Location: [facility];  Service: Endoscopy;  Laterality: N/A;  . GROIN DEBRIDEMENT Right 07/25/2016   Procedure: EVACUATION HEMATOMA RIGHT GROIN;  Surgeon: Waynetta Sandy, MD;  Location: [place];  Service: Vascular;  Laterality: Right;  . HEMORRHOID SURGERY  2013  approx.   ?lanced/ banding  . HEMORRHOID SURGERY N/A 12/07/2016   Procedure: 3 COLUMN HEMORRHOIDECTOMY;  Surgeon: Leighton Ruff, MD;  Location: [facility];  Service: General;  Laterality:  N/A;  . INTRAOPERATIVE ARTERIOGRAM Right 07/17/2016   Procedure: INTRA OPERATIVE ANGIOGRAM OF RIGHT COMMON ILIAC ARTERY;  Surgeon: Conrad Eagle, MD;  Location: [place];  Service: Vascular;  Laterality: Right;  . KNEE SURGERY Right 1983  . PATCH ANGIOPLASTY Right 07/17/2016   Procedure: PATCH ANGIOPLASTY RIGHT FEMORAL ARTERY USING Rueben Bash BIOLOGIC PATCH;  Surgeon: Conrad Shar[place]own, MD;  Location: [place];  Service: Vascular;  Laterality: Right;  . PERIPHERAL VASCULAR CATHETERIZATION N/A 02/24/2016   Procedure: Abdominal Aortogram w/Lower Extremity;  Surgeon: Conrad La Pryor, MD;  Location: [place] [facility];  Service: Cardiovascular;  Laterality: N/A;  . PERIPHERAL VASCULAR CATHETERIZATION Right 02/24/2016   Procedure: Peripheral Vascular Intervention;  Surgeon: Conrad Solana, MD;  Location: [place] [facility];  Service:  Cardiovascular;  Laterality: Right;  Common iliac  . PERIPHERAL VASCULAR CATHETERIZATION N/A 05/11/2016   Procedure: Abdominal Aortogram;  Surgeon: Conrad [place], MD;  Location: [place] [facility];  Service: Cardiovascular;  Laterality: N/A;  . PERIPHERAL VASCULAR CATHETERIZATION N/A 05/11/2016   Procedure: Lower Extremity Angiography;  Surgeon: Conrad Eclectic, MD;  Location: [place] [facility];  Service: Cardiovascular;  Laterality: N/A;  . THROMBECTOMY FEMORAL ARTERY Right 07/17/2016   Procedure: THROMBECTOMY RIGHT FEMORAL ARTERY;  Surgeon: Conrad , MD;  Location: [place];  Service: Vascular;  Laterality: Right;  . TRANSTHORACIC ECHOCARDIOGRAM  06-20-2016   dr hilty   ef 55-60%,  grade 1 diastolic dysfunction/  mild TR    FAMHx:  Family History  Problem Relation Age of Onset  . Cancer Father        Lung    SOCHx:   reports that he has been smoking cigarettes and e-cigarettes. He has a 33.00 pack-year smoking history. He has never used smokeless tobacco. He reports that he does not drink alcohol or use drugs.  ALLERGIES:  Allergies  Allergen Reactions  . Cymbalta [Duloxetine Hcl] Other (See Comments)    Headaches    ROS: Pertinent items noted in HPI and remainder of comprehensive ROS otherwise negative.  HOME MEDS: Current Outpatient Medications on File Prior to Visit  Medication Sig Dispense Refill  . cilostazol (PLETAL) 50 MG tablet Take 1 tablet (50 mg total) by mouth 2 (two) times daily. 60 tablet 0  . esomeprazole (NEXIUM) 20 MG capsule Take 20 mg by mouth daily at 12 noon.    . gabapentin (NEURONTIN) 600 MG tablet Take 1 tablet (600 mg total) by mouth 3 (three) times daily. 270 tablet 3  . oxyCODONE-acetaminophen (PERCOCET) 10-325 MG tablet Take 1 tablet by mouth every 8 (eight) hours as needed for pain. 90 tablet 0  . propranolol (INDERAL) 10 MG tablet Take 1 tablet (10 mg total) by mouth 3 (three) times daily. 90 tablet 2  . rosuvastatin (CRESTOR) 5 MG tablet Take 1  tablet (5 mg total) by mouth daily. 90 tablet 2  . sildenafil (VIAGRA) 25 MG tablet 2-3 tabs po x 1. Take 30 minutes to 4 hours prior to sexual activity. 21 tablet 2  . warfarin (COUMADIN) 1 MG tablet Take 1 tablet (1 mg total) by mouth daily. (Patient taking differently: Take 7 mg by mouth daily. ) 30 tablet 1  . warfarin (COUMADIN) 6 MG tablet Take 1 tablet (6 mg total) by mouth daily. 35 tablet 3   No current facility-administered medications on file prior to visit.     LABS/IMAGING: No results found for this or any previous visit (from the past 48 hour(s)). No  results found.  WEIGHTS: Wt Readings from Last 3 Encounters:  04/12/18 171 lb 8 oz (77.8 kg)  04/03/18 172 lb 9.6 oz (78.3 kg)  02/08/18 176 lb 9.6 oz (80.1 kg)    VITALS: There were no vitals taken for this visit.  EXAM: Deferred  EKG: Deferred  ASSESSMENT: 1. 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 Newly identified ventricular bigeminy 3. 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 Dyslipidemia 5. PAD with lifestyle limiting claudication status post right CIA stent 6. 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 Chest pain, progressive dyspnea and fatigue 8. Right knee injury 9. Tobacco abuse  PLAN: 1.   Mr. Stepanek has a new cardiomyopathy.  It is unclear whether this is ischemic or nonischemic but may be related to ventricular bigeminy.  I proposed discontinuing his propranolol and switching him to carvedilol 3.125 mg twice daily.  This should help both with heart failure as well as some of the anxiolytic properties he is to propranolol for.  Given his history of PAD, coronary artery calcification and chest discomfort with progressive dyspnea and fatigue, I would recommend CT coronary angiography.  This is preferred a heart catheterization because he is on warfarin and had thrombus related to antiphospholipid antibody syndrome.  This would make catheterization more cumbersome because of the need for  bridging.  He is not anticipating undergoing surgery until next year.  Plan follow-up with me afterwards.  Pixie Casino, MD, [facility], [place] Director of the Advanced Lipid Disorders &  Cardiovascular Risk Reduction Clinic Diplomate of the American Board of Clinical Lipidology Attending Cardiologist  Direct Dial: [phone]  Fax: [phone]  Website:  [email or web address] 05/03/2018, 9:29 AM

## 2018-05-03 NOTE — Patient Instructions (Addendum)
Medication Instructions:  STOP propranolol START carvedilol 3.125mg  twice daily If you need a refill on your cardiac medications before your next appointment, please call your pharmacy.   Lab work: NONE If you have labs (blood work) drawn today and your tests are completely normal, you will receive your results only by: Marland Kitchen MyChart Message (if you have MyChart) OR . A paper copy in the mail If you have any lab test that is abnormal or we need to change your treatment, we will call you to review the results.  Testing/Procedures: Dr. Debara Pickett has ordered a coronary CT study. This is done at [facility]. This test will be pre-authorized with insurance first and then you will be called to scheduled. Instructions are below.  Follow-Up: At [facility], you and your health needs are our priority.  As part of our continuing mission to provide you with exceptional heart care, we have created designated Provider Care Teams.  These Care Teams include your primary Cardiologist (physician) and Advanced Practice Providers (APPs -  Physician Assistants and Nurse Practitioners) who all work together to provide you with the care you need, when you need it. You will need a follow up appointment in 6-8 weeks. You may see Pixie Casino, MD or one of the following Advanced Practice Providers on your designated Care Team: [place], [place] . Fabian Sharp, PA-C  Any Other Special Instructions Will Be Listed Below (If Applicable).  Coronary CT Instructions  Please arrive at the [facility] main entrance of [facility] at _________ AM/PM (30-45 minutes prior to test start time)  [facility], [place] [address] [phone]  Proceed to the [facility] Radiology Department (First Floor).  Please follow these instructions carefully (unless otherwise directed):  Hold all erectile dysfunction medications at least 48 hours prior to test.  On the Night Before the  Test: . Be sure to Drink plenty of water. . Do not consume any caffeinated/decaffeinated beverages or chocolate 12 hours prior to your test. . Do not take any antihistamines 12 hours prior to your test.  On the Day of the Test: . Drink plenty of water. Do not drink any water within one hour of the test. . Do not eat any food 4 hours prior to the test. . You may take your regular medications prior to the test.        After the Test: . Drink plenty of water. . After receiving IV contrast, you may experience a mild flushed feeling. This is normal. . On occasion, you may experience a mild rash up to 24 hours after the test. This is not dangerous. If this occurs, you can take Benadryl 25 mg and increase your fluid intake. . If you experience trouble breathing, this can be serious. If it is severe call 911 IMMEDIATELY. If it is mild, please call our office. . If you take any of these medications: Glipizide/Metformin, Avandament, Glucavance, please do not take 48 hours after completing test.

## 2018-05-05 ENCOUNTER — Encounter: Payer: Self-pay | Admitting: Family Medicine

## 2018-05-06 ENCOUNTER — Other Ambulatory Visit: Payer: Self-pay

## 2018-05-06 MED ORDER — CILOSTAZOL 50 MG PO TABS: 50.0000 mg | ORAL_TABLET | Freq: Two times a day (BID) | ORAL | 0 refills | Status: DC

## 2018-05-06 MED FILL — CILOSTAZOL 50 MG TABLET: 50 | 90 days supply | Qty: 180 | Fill #0

## 2018-05-10 ENCOUNTER — Ambulatory Visit (INDEPENDENT_AMBULATORY_CARE_PROVIDER_SITE_OTHER): Payer: 59 | Admitting: Family Medicine

## 2018-05-10 ENCOUNTER — Encounter: Payer: Self-pay | Admitting: Family Medicine

## 2018-05-10 VITALS — BP 124/74 | HR 62 | Temp 98.6°F | Ht 69.0 in | Wt 172.6 lb

## 2018-05-10 DIAGNOSIS — M1711 Unilateral primary osteoarthritis, right knee: Secondary | ICD-10-CM

## 2018-05-10 DIAGNOSIS — F17210 Nicotine dependence, cigarettes, uncomplicated: Secondary | ICD-10-CM | POA: Diagnosis not present

## 2018-05-10 DIAGNOSIS — Z7901 Long term (current) use of anticoagulants: Secondary | ICD-10-CM

## 2018-05-10 DIAGNOSIS — I498 Other specified cardiac arrhythmias: Secondary | ICD-10-CM

## 2018-05-10 DIAGNOSIS — M5441 Lumbago with sciatica, right side: Secondary | ICD-10-CM

## 2018-05-10 DIAGNOSIS — M79671 Pain in right foot: Secondary | ICD-10-CM

## 2018-05-10 DIAGNOSIS — M5412 Radiculopathy, cervical region: Secondary | ICD-10-CM | POA: Diagnosis not present

## 2018-05-10 DIAGNOSIS — D500 Iron deficiency anemia secondary to blood loss (chronic): Secondary | ICD-10-CM | POA: Diagnosis not present

## 2018-05-10 DIAGNOSIS — I499 Cardiac arrhythmia, unspecified: Secondary | ICD-10-CM

## 2018-05-10 DIAGNOSIS — G8929 Other chronic pain: Secondary | ICD-10-CM

## 2018-05-10 DIAGNOSIS — E785 Hyperlipidemia, unspecified: Secondary | ICD-10-CM

## 2018-05-10 DIAGNOSIS — I429 Cardiomyopathy, unspecified: Secondary | ICD-10-CM

## 2018-05-10 DIAGNOSIS — D6859 Other primary thrombophilia: Secondary | ICD-10-CM | POA: Diagnosis not present

## 2018-05-10 DIAGNOSIS — N529 Male erectile dysfunction, unspecified: Secondary | ICD-10-CM

## 2018-05-10 DIAGNOSIS — D729 Disorder of white blood cells, unspecified: Secondary | ICD-10-CM

## 2018-05-10 DIAGNOSIS — I739 Peripheral vascular disease, unspecified: Secondary | ICD-10-CM

## 2018-05-10 LAB — COMPREHENSIVE METABOLIC PANEL
ALT: 19 U/L (ref 0–53)
AST: 14 U/L (ref 0–37)
Albumin: 4.3 g/dL (ref 3.5–5.2)
Alkaline Phosphatase: 85 U/L (ref 39–117)
BUN: 8 mg/dL (ref 6–23)
CO2: 25 mEq/L (ref 19–32)
Calcium: 9.9 mg/dL (ref 8.4–10.5)
Chloride: 106 mEq/L (ref 96–112)
Creatinine, Ser: 0.81 mg/dL (ref 0.40–1.50)
GFR: 106.85 mL/min (ref >60.00)
Glucose, Bld: 87 mg/dL (ref 70–99)
Potassium: 4.4 mEq/L (ref 3.5–5.1)
Sodium: 139 mEq/L (ref 135–145)
Total Bilirubin: 0.6 mg/dL (ref 0.2–1.2)
Total Protein: 7.1 g/dL (ref 6.0–8.3)

## 2018-05-10 LAB — CBC WITH DIFFERENTIAL/PLATELET
Basophils Absolute: 0.1 10*3/uL (ref 0.0–0.1)
Basophils Relative: 1.1 % (ref 0.0–3.0)
Eosinophils Absolute: 0.8 10*3/uL — ABNORMAL HIGH (ref 0.0–0.7)
Eosinophils Relative: 6.1 % — ABNORMAL HIGH (ref 0.0–5.0)
HCT: 49.8 % (ref 39.0–52.0)
Hemoglobin: 17 g/dL (ref 13.0–17.0)
Lymphocytes Relative: 23.3 % (ref 12.0–46.0)
Lymphs Abs: 3 10*3/uL (ref 0.7–4.0)
MCHC: 34.1 g/dL (ref 30.0–36.0)
MCV: 99 fl (ref 78.0–100.0)
Monocytes Absolute: 0.7 10*3/uL (ref 0.1–1.0)
Monocytes Relative: 5.4 % (ref 3.0–12.0)
Neutro Abs: 8.3 10*3/uL — ABNORMAL HIGH (ref 1.4–7.7)
Neutrophils Relative %: 64.1 % (ref 43.0–77.0)
Platelets: 207 10*3/uL (ref 150.0–400.0)
RBC: 5.03 Mil/uL (ref 4.22–5.81)
RDW: 13.4 % (ref 11.5–15.5)
WBC: 12.9 10*3/uL — ABNORMAL HIGH (ref 4.0–10.5)

## 2018-05-10 MED ORDER — OXYCODONE-ACETAMINOPHEN 10-325 MG PO TABS: 1.0000 | ORAL_TABLET | Freq: Three times a day (TID) | ORAL | 0 refills | Status: DC | PRN

## 2018-05-10 NOTE — Progress Notes (Signed)
[name] is a 52 y.o. male is here for follow up.  History of Present Illness:   [name], CMA acting as scribe for Dr. Briscoe Deutscher.   HPI:  Patient in office for follow up. Will need refill on pain medications. He will not be able to have cervical spine surgery until after the first of the year. Undergoing cardiology work-up. Last visit with Cardiology below:  ASSESSMENT: 1. 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 Newly identified ventricular bigeminy 3. 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 Dyslipidemia 5. PAD with lifestyle limiting claudication status post right CIA stent 6. 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 Chest pain, progressive dyspnea and fatigue 8. Right knee injury 9. Tobacco abuse  PLAN: 1.   [name] has a new cardiomyopathy.  It is unclear whether this is ischemic or nonischemic but may be related to ventricular bigeminy.  I proposed discontinuing his propranolol and switching him to carvedilol 3.125 mg twice daily.  This should help both with heart failure as well as some of the anxiolytic properties he is to propranolol for.  Given his history of PAD, coronary artery calcification and chest discomfort with progressive dyspnea and fatigue, I would recommend CT coronary angiography.  This is preferred a heart catheterization because he is on warfarin and had thrombus related to antiphospholipid antibody syndrome.  This would make catheterization more cumbersome because of the need for bridging. He is not anticipating undergoing surgery until next year.  Patient also followed up with Hematology. Told that no worry for bone marrow involvement. Still suspicious for slow GI leak. Recommends serial CBC/TIBC and prn iron infusions to keep Ferritin >100. Will check urine for microscopic hematuria.   Pain in right buttock, thigh, and foot still debilitating. Pain somewhat improved with use of Pletal. Unable to drive. 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 Other risk factors at baseline include strong history of smoking. Patient's right common iliac artery was stented and he had a repeat thrombosis while on Plavix noted on 05/11/2016. Aspirin was added at that time. Noted to have elevated beta-2 glycoprotein IgG with titers that were significantly elevated at 150. Repeat titers lower at 46 but still significant. Cannot rule out antiphospholipid antibody syndrome. 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  Depression screen [facility] 2/9 05/10/2018 07/08/2016 07/05/2016  Decreased Interest 2 0 1  Down, Depressed, Hopeless 1 0 0  PHQ - 2 Score 3 0 1  Altered sleeping 1 - -  Tired, decreased energy 1 - -  Change in appetite 2 - -  Feeling bad or failure about yourself  0 - -  Trouble concentrating 0 - -  Moving slowly or fidgety/restless 0 - -  Suicidal thoughts 0 - -  PHQ-9 Score 7 - -  Difficult doing work/chores Somewhat difficult - -   PMHx, SurgHx, SocialHx, FamHx, Medications, and Allergies were reviewed in the Visit Navigator and updated as appropriate.   Patient Active Problem List   Diagnosis Date Noted  . Chronic foot pain, right 05/11/2018  . Cervical radiculitis 01/14/2018  . Degenerative arthritis of right knee 09/06/2017  . Nonallopathic lesion of lumbosacral region 09/06/2017  . Nonallopathic lesion of thoracic region 09/06/2017  . Nonallopathic lesion of sacral region 09/06/2017  . Neutrophilia 06/18/2017  . Low testosterone in male 05/04/2017  .  Erectile dysfunction 11/19/2016  . Chronic right-sided low back pain with right-sided sciatica 11/19/2016  . Internal hemorrhoid 11/19/2016  . 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 Arthritis of left acromioclavicular joint 09/29/2016  . Monitoring for anticoagulant use 09/15/2016  . Long term (current) use of  anticoagulants [Z79.01] 09/15/2016  . Primary hypercoagulable state ([place]) [D68.59] 09/15/2016  . History of alcohol abuse 07/11/2016  . Chronic left shoulder pain 07/08/2016  . Iron deficiency anemia due to chronic blood loss   . Gastroesophageal reflux disease   . Dyslipidemia 06/27/2016  . Antiphospholipid antibody positive 06/27/2016  . Shortness of breath 06/02/2016  . Thrombus 06/02/2016  . PAD (peripheral artery disease) ([place]) 06/02/2016  . Claudication ([place]) 06/02/2016  . 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 Atherosclerosis of native arteries of extremity with intermittent claudication ([place]) 02/24/2016  . Weakness of right leg 01/11/2016  . Piriformis syndrome of right side 12/29/2015  . Leg length discrepancy 12/29/2015  . Anxiety with depression 06/24/2014  . Nicotine dependence 06/24/2014   Social History   Tobacco Use  . Smoking status: Current Every Day Smoker    Packs/day: 1.00    Years: 33.00    Pack years: 33.00    Types: Cigarettes, E-cigarettes  . Smokeless tobacco: Never Used  Substance Use Topics  . Alcohol use: No    Comment: per pt recovering alcoholic in remission since 04-25-2016  . Drug use: No   Current Medications and Allergies:   .  carvedilol (COREG) 3.125 MG tablet, Take 1 tablet (3.125 mg total) by mouth 2 (two) times daily., Disp: 180 tablet, Rfl: 3 .  cilostazol (PLETAL) 50 MG tablet, Take 1 tablet (50 mg total) by mouth 2 (two) times daily., Disp: 180 tablet, Rfl: 0 .  esomeprazole (NEXIUM) 20 MG capsule, Take 20 mg by mouth daily at 12 noon., Disp: , Rfl:  .  gabapentin (NEURONTIN) 600 MG tablet, Take 1 tablet (600 mg total) by mouth 3 (three) times daily., Disp: 270 tablet, Rfl: 3 .  oxyCODONE-acetaminophen (PERCOCET) 10-325 MG tablet, Take 1 tablet by mouth every 8 (eight) hours as needed for pain., Disp: 90 tablet, Rfl: 0 .  rosuvastatin (CRESTOR) 5 MG tablet, Take 1 tablet (5 mg total) by mouth daily., Disp: 90 tablet, Rfl: 2 .   sildenafil (VIAGRA) 25 MG tablet, 2-3 tabs po x 1. Take 30 minutes to 4 hours prior to sexual activity., Disp: 21 tablet, Rfl: 2 .  warfarin (COUMADIN) 1 MG tablet, Take 1 tablet (1 mg total) by mouth daily. (Patient taking differently: Take 7 mg by mouth daily. ), Disp: 30 tablet, Rfl: 1 .  warfarin (COUMADIN) 6 MG tablet, Take 1 tablet (6 mg total) by mouth daily., Disp: 35 tablet, Rfl: 3   Allergies  Allergen Reactions  . Cymbalta [Duloxetine Hcl] Other (See Comments)    Headaches   Review of Systems   Pertinent items are noted in the HPI. Otherwise, a complete ROS is negative.  Vitals:   Vitals:   05/10/18 0927  BP: 124/74  Pulse: 62  Temp: 98.6 F (37 C)  TempSrc: Oral  SpO2: 98%  Weight: 172 lb 9.6 oz (78.3 kg)  Height: 5\' 9"  (1.753 m)     Body mass index is 25.49 kg/m.  Physical Exam:   Physical Exam Vitals signs and nursing note reviewed.  Constitutional:      General: He is not in acute distress.    Appearance: He is well-developed.  HENT:     Head: Normocephalic  and atraumatic.     Right Ear: External ear normal.     Left Ear: External ear normal.     Nose: Nose normal.  Eyes:     Conjunctiva/sclera: Conjunctivae normal.     Pupils: Pupils are equal, round, and reactive to light.  Neck:     Musculoskeletal: Neck supple.  Cardiovascular:     Rate and Rhythm: Normal rate and regular rhythm.     Pulses:          Dorsalis pedis pulses are 2+ on the right side and 2+ on the left side.  Pulmonary:     Effort: Pulmonary effort is normal.  Musculoskeletal:     Cervical back: He exhibits decreased range of motion, bony tenderness and spasm.     Lumbar back: He exhibits decreased range of motion, bony tenderness and spasm.     Right lower leg: No edema.     Left lower leg: No edema.  Neurological:     General: No focal deficit present.     Mental Status: He is alert and oriented to person, place, and time.  Psychiatric:        Mood and Affect: Mood  normal.        Behavior: Behavior normal.        Thought Content: Thought content normal.        Judgment: Judgment normal.     Results for orders placed or performed in visit on 05/10/18  CBC with Differential/Platelet  Result Value Ref Range   WBC 12.9 (H) 4.0 - 10.5 K/uL   RBC 5.03 4.22 - 5.81 Mil/uL   Hemoglobin 17.0 13.0 - 17.0 g/dL   HCT 49.8 39.0 - 52.0 %   MCV 99.0 78.0 - 100.0 fl   MCHC 34.1 30.0 - 36.0 g/dL   RDW 13.4 11.5 - 15.5 %   Platelets 207.0 150.0 - 400.0 K/uL   Neutrophils Relative % 64.1 43.0 - 77.0 %   Lymphocytes Relative 23.3 12.0 - 46.0 %   Monocytes Relative 5.4 3.0 - 12.0 %   Eosinophils Relative 6.1 (H) 0.0 - 5.0 %   Basophils Relative 1.1 0.0 - 3.0 %   Neutro Abs 8.3 (H) 1.4 - 7.7 K/uL   Lymphs Abs 3.0 0.7 - 4.0 K/uL   Monocytes Absolute 0.7 0.1 - 1.0 K/uL   Eosinophils Absolute 0.8 (H) 0.0 - 0.7 K/uL   Basophils Absolute 0.1 0.0 - 0.1 K/uL  Comprehensive metabolic panel  Result Value Ref Range   Sodium 139 135 - 145 mEq/L   Potassium 4.4 3.5 - 5.1 mEq/L   Chloride 106 96 - 112 mEq/L   CO2 25 19 - 32 mEq/L   Glucose, Bld 87 70 - 99 mg/dL   BUN 8 6 - 23 mg/dL   Creatinine, Ser 0.81 0.40 - 1.50 mg/dL   Total Bilirubin 0.6 0.2 - 1.2 mg/dL   Alkaline Phosphatase 85 39 - 117 U/L   AST 14 0 - 37 U/L   ALT 19 0 - 53 U/L   Total Protein 7.1 6.0 - 8.3 g/dL   Albumin 4.3 3.5 - 5.2 g/dL   Calcium 9.9 8.4 - 10.5 mg/dL   GFR 106.85 >60.00 mL/min  Iron, TIBC and Ferritin Panel  Result Value Ref Range   Iron 68 50 - 180 mcg/dL   TIBC 304 250 - 425 mcg/dL (calc)   %SAT 22 20 - 48 % (calc)   Ferritin 80 38 - 380 ng/mL  Assessment and Plan:   Khairi was seen today for follow-up.  Diagnoses and all orders for this visit:  Iron deficiency anemia due to chronic blood loss Comments: Will continue to monitor closely, keeping ferritin > 100. Will check for microscopic hematuria.  Orders: -     CBC with Differential/Platelet -     Iron, TIBC and  Ferritin Panel  Chronic right-sided low back pain with right-sided sciatica Comments: On chronic opioid Rx with no concerns.  Orders: -     oxyCODONE-acetaminophen (PERCOCET) 10-325 MG tablet; Take 1 tablet by mouth every 8 (eight) hours as needed for pain. -     oxyCODONE-acetaminophen (PERCOCET) 10-325 MG tablet; Take 1 tablet by mouth every 8 (eight) hours as needed for pain. -     oxyCODONE-acetaminophen (PERCOCET) 10-325 MG tablet; Take 1 tablet by mouth every 8 (eight) hours as needed for pain. -     Ambulatory referral to Home Health  Cervical radiculitis Comments: Worsening and currently undergoing Cardiology work-up for clearance. Anticipating surgery next year. Note: eval by 2 specialists with same recommendation.  Orders: -     oxyCODONE-acetaminophen (PERCOCET) 10-325 MG tablet; Take 1 tablet by mouth every 8 (eight) hours as needed for pain. -     oxyCODONE-acetaminophen (PERCOCET) 10-325 MG tablet; Take 1 tablet by mouth every 8 (eight) hours as needed for pain. -     oxyCODONE-acetaminophen (PERCOCET) 10-325 MG tablet; Take 1 tablet by mouth every 8 (eight) hours as needed for pain. -     Ambulatory referral to [place]  Cigarette nicotine dependence without complication Comments: Decreasing with plans to stop near time of surgery.   Long term (current) use of anticoagulants Comments: Denies overt signs of bleeding. Will continue to monitor.  Primary hypercoagulable state ([place]) [D68.59] Comments: Continue coumadin.  Dyslipidemia Comments: Continue statin.  Orders: -     Comprehensive metabolic panel  PAD (peripheral artery disease) ([facility]) Comments: Pletal seems to be helping leg and foot pain, so claudication still an issue, at least contributing to right leg and foot pain.  Chronic foot pain, right Comments: Debilitating. Will continue current medications to control pain.  Orders: -     oxyCODONE-acetaminophen (PERCOCET) 10-325 MG tablet; Take 1 tablet  by mouth every 8 (eight) hours as needed for pain. -     oxyCODONE-acetaminophen (PERCOCET) 10-325 MG tablet; Take 1 tablet by mouth every 8 (eight) hours as needed for pain. -     oxyCODONE-acetaminophen (PERCOCET) 10-325 MG tablet; Take 1 tablet by mouth every 8 (eight) hours as needed for pain. -     Ambulatory referral to [place]  Primary osteoarthritis of right knee Comments: Worsening. No swelling.  Claudication [facility]), with lifestyle limiting claudication s/p R ICA stent Comments: Continue Pletal.   Erectile dysfunction Comments: Cardiology okay with continued use of Viagra prn.   Neutrophilia Comments: Ongoing, stable. Discussed previously with Hematology. Borderline. Likely reactive.  Ventricular bigeminy  Cardiomyopathy, LVEF 40-45%, 04/2018    . Orders and follow up as documented in [place], reviewed diet, exercise and weight control, cardiovascular risk and specific lipid/LDL goals reviewed, reviewed medications and side effects in detail.  . Reviewed expectations re: course of current medical issues. . Outlined signs and symptoms indicating need for more acute intervention. . Patient verbalized understanding and all questions were answered. . Patient received an After Visit Summary.  Briscoe Deutscher, DO [place] [place] 05/11/2018  CMA served as Education administrator during this visit. History, Physical, and Plan performed  by medical provider. The above documentation has been reviewed and is accurate and complete. Briscoe Deutscher, D.O.

## 2018-05-11 DIAGNOSIS — M79671 Pain in right foot: Secondary | ICD-10-CM

## 2018-05-11 DIAGNOSIS — G8929 Other chronic pain: Secondary | ICD-10-CM | POA: Insufficient documentation

## 2018-05-11 LAB — IRON,TIBC AND FERRITIN PANEL
%SAT: 22 % (calc) (ref 20–48)
Ferritin: 80 ng/mL (ref 38–380)
Iron: 68 ug/dL (ref 50–180)
TIBC: 304 mcg/dL (calc) (ref 250–425)

## 2018-05-14 ENCOUNTER — Other Ambulatory Visit: Payer: Self-pay

## 2018-05-14 DIAGNOSIS — D500 Iron deficiency anemia secondary to blood loss (chronic): Secondary | ICD-10-CM

## 2018-05-16 ENCOUNTER — Encounter: Payer: Self-pay | Admitting: Family Medicine

## 2018-05-16 ENCOUNTER — Other Ambulatory Visit: Payer: Self-pay | Admitting: Family Medicine

## 2018-05-16 DIAGNOSIS — M79671 Pain in right foot: Secondary | ICD-10-CM

## 2018-05-16 DIAGNOSIS — M5412 Radiculopathy, cervical region: Secondary | ICD-10-CM

## 2018-05-16 DIAGNOSIS — G8929 Other chronic pain: Secondary | ICD-10-CM

## 2018-05-16 DIAGNOSIS — M5441 Lumbago with sciatica, right side: Principal | ICD-10-CM

## 2018-05-16 MED ORDER — OXYCODONE-ACETAMINOPHEN 10-325 MG PO TABS: 1.0000 | ORAL_TABLET | Freq: Three times a day (TID) | ORAL | 0 refills | Status: DC | PRN

## 2018-05-16 MED FILL — WARFARIN SODIUM 1 MG TABLET: 1 | 30 days supply | Qty: 30 | Fill #1

## 2018-05-16 MED FILL — GABAPENTIN 600 MG TABLET: 600 | 90 days supply | Qty: 270 | Fill #2

## 2018-05-16 MED FILL — WARFARIN SODIUM 6 MG TABLET: 6 | 35 days supply | Qty: 35 | Fill #3

## 2018-05-16 NOTE — Telephone Encounter (Signed)
See note

## 2018-05-17 ENCOUNTER — Other Ambulatory Visit: Payer: Self-pay

## 2018-05-17 MED ORDER — VARENICLINE TARTRATE 1 MG PO TABS: 1.0000 mg | ORAL_TABLET | Freq: Two times a day (BID) | ORAL | 0 refills | Status: DC

## 2018-05-17 MED ORDER — VARENICLINE TARTRATE 0.5 MG X 11 & 1 MG X 42 PO MISC: ORAL | 0 refills | Status: DC

## 2018-05-17 MED FILL — CHANTIX STARTING MONTH BOX: 0.5 MG X 11 | 28 days supply | Qty: 53 | Fill #0

## 2018-05-17 MED FILL — OXYCODONE-APAP 10-325: 10-325 | 30 days supply | Qty: 90 | Fill #0

## 2018-05-24 ENCOUNTER — Ambulatory Visit: Payer: 59

## 2018-05-27 ENCOUNTER — Ambulatory Visit (INDEPENDENT_AMBULATORY_CARE_PROVIDER_SITE_OTHER): Payer: 59 | Admitting: General Practice

## 2018-05-27 DIAGNOSIS — Z7901 Long term (current) use of anticoagulants: Secondary | ICD-10-CM

## 2018-05-27 DIAGNOSIS — D6859 Other primary thrombophilia: Secondary | ICD-10-CM

## 2018-05-27 LAB — POCT INR: INR: 2 (ref 2.0–3.0)

## 2018-05-27 NOTE — Patient Instructions (Signed)
Pre visit review using our clinic review tool, if applicable. No additional management support is needed unless otherwise documented below in the visit note.  Take 9 mg today and tomorrow and then continue to take 6 mg daily except 7 mg on Tuesday.  Re-check in 4 weeks.

## 2018-05-29 ENCOUNTER — Telehealth (HOSPITAL_COMMUNITY): Payer: Self-pay | Admitting: Emergency Medicine

## 2018-05-29 NOTE — Telephone Encounter (Signed)
Reaching out to patient to offer assistance regarding upcoming cardiac imaging study; pt verbalizes understanding of appt date/time, parking situation and where to check in, pre-test NPO status and medications ordered, and verified current allergies; name and call back number provided for further questions should they arise Tiffanyann Deroo RN Navigator Cardiac Imaging [phone] 

## 2018-05-30 ENCOUNTER — Encounter (HOSPITAL_COMMUNITY): Payer: Self-pay

## 2018-05-30 ENCOUNTER — Ambulatory Visit (HOSPITAL_COMMUNITY): Admission: RE | Admit: 2018-05-30 | Payer: 59 | Source: Ambulatory Visit

## 2018-05-30 ENCOUNTER — Ambulatory Visit (HOSPITAL_COMMUNITY)
Admission: RE | Admit: 2018-05-30 | Discharge: 2018-05-30 | Disposition: A | Discharge status: Home / Self Care | Payer: 59 | Source: Ambulatory Visit | Attending: Internal Medicine | Admitting: Internal Medicine

## 2018-05-30 DIAGNOSIS — I429 Cardiomyopathy, unspecified: Secondary | ICD-10-CM | POA: Insufficient documentation

## 2018-05-30 DIAGNOSIS — I739 Peripheral vascular disease, unspecified: Secondary | ICD-10-CM | POA: Insufficient documentation

## 2018-05-30 DIAGNOSIS — R079 Chest pain, unspecified: Secondary | ICD-10-CM | POA: Insufficient documentation

## 2018-05-30 MED ORDER — IOPAMIDOL (ISOVUE-370) INJECTION 76%
100.0000 mL | Freq: Once | INTRAVENOUS | Status: AC | PRN
  Administered 2018-05-30: 100 mL via INTRAVENOUS

## 2018-05-30 MED ORDER — METOPROLOL TARTRATE 5 MG/5ML IV SOLN
INTRAVENOUS | Status: AC
  Filled 2018-05-30: qty 15

## 2018-05-30 MED ORDER — NITROGLYCERIN 0.4 MG SL SUBL
0.8000 mg | SUBLINGUAL_TABLET | Freq: Once | SUBLINGUAL | Status: DC
  Filled 2018-05-30: qty 25

## 2018-05-30 MED ORDER — METOPROLOL TARTRATE 5 MG/5ML IV SOLN
5.0000 mg | INTRAVENOUS | Status: DC | PRN
  Administered 2018-05-30: 5 mg via INTRAVENOUS
  Filled 2018-05-30: qty 5

## 2018-05-30 MED ORDER — NITROGLYCERIN 0.4 MG SL SUBL
SUBLINGUAL_TABLET | SUBLINGUAL | Status: AC
  Administered 2018-05-30: 0.8 mg
  Filled 2018-05-30: qty 2

## 2018-05-30 NOTE — Progress Notes (Signed)
CT complete. PAtient denies any complaints. Offered patient beverage and snack, and refused.

## 2018-05-31 ENCOUNTER — Ambulatory Visit: Payer: 59 | Admitting: Family Medicine

## 2018-06-03 ENCOUNTER — Other Ambulatory Visit: Payer: Self-pay

## 2018-06-03 ENCOUNTER — Ambulatory Visit (HOSPITAL_COMMUNITY)
Admission: RE | Admit: 2018-06-03 | Discharge: 2018-06-03 | Disposition: A | Discharge status: Home / Self Care | Payer: 59 | Source: Ambulatory Visit | Attending: Family Medicine | Admitting: Family Medicine

## 2018-06-03 DIAGNOSIS — D509 Iron deficiency anemia, unspecified: Secondary | ICD-10-CM | POA: Diagnosis present

## 2018-06-03 DIAGNOSIS — D500 Iron deficiency anemia secondary to blood loss (chronic): Secondary | ICD-10-CM | POA: Insufficient documentation

## 2018-06-03 MED ORDER — FERUMOXYTOL INJECTION 510 MG/17 ML: 510.0000 mg | Freq: Once | INTRAVENOUS | Status: DC

## 2018-06-03 MED ORDER — SODIUM CHLORIDE 0.9 % IV SOLN
INTRAVENOUS | Status: DC | PRN
  Administered 2018-06-03: 250 mL via INTRAVENOUS

## 2018-06-03 MED ORDER — SODIUM CHLORIDE 0.9 % IV SOLN
510.0000 mg | Freq: Once | INTRAVENOUS | Status: AC
  Administered 2018-06-03: 510 mg via INTRAVENOUS
  Filled 2018-06-03: qty 17

## 2018-06-03 NOTE — Discharge Instructions (Signed)

## 2018-06-03 NOTE — Progress Notes (Signed)
PATIENT CARE CENTER NOTE  Diagnosis: Iron Deficiency Anemia    Provider: Dr. Briscoe Deutscher   Procedure: IV Feraheme    Note: Patient received Feraheme infusion. Observed patient for 30 minutes post-infusion. Tolerated well. Vital signs stable. Discharge instructions given. Patient alert, oriented and ambulatory at discharge.

## 2018-06-13 ENCOUNTER — Encounter: Payer: Self-pay | Admitting: Internal Medicine

## 2018-06-13 ENCOUNTER — Ambulatory Visit: Payer: 59 | Admitting: Internal Medicine

## 2018-06-13 VITALS — BP 118/76 | HR 80 | Ht 69.0 in | Wt 174.4 lb

## 2018-06-13 DIAGNOSIS — I493 Ventricular premature depolarization: Secondary | ICD-10-CM | POA: Diagnosis not present

## 2018-06-13 DIAGNOSIS — I428 Other cardiomyopathies: Secondary | ICD-10-CM

## 2018-06-13 NOTE — Progress Notes (Signed)
OFFICE NOTE  Chief Complaint:  Follow-up echo  Primary Care Physician: Briscoe Deutscher, DO  HPI:  [name] is a 53 y.o. male who presents at the request of his vascular surgeon for coronary evaluation. He recently was noted to have claudication of lower extremities and underwent ABIs which indicated a reduced ABI of 0.8 on the right and TBI of 0.47. 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 He subsequently underwent right common iliac artery stenting. Afterwards he noted marked improvement in his right leg pain. Subsequent to that he had an acute presentation of right leg pain and was again brought back for angiography on 05/11/2016. The previously placed common iliac stent was occluded and then retreated. No obvious etiology for this was found. It was thought that a cardio embolic episode could be responsible for this, however in my experience this is extremely rare for clot to travel from the ventricle and large and a patent stent in the common iliac artery. What would be more likely could be edge dissection or perhaps underexpansion of the stent or even noncompliance with antiplatelet medications. A clotting disorder has been postulated however seems unlikely given his age and the fact that he's had no prior clotting history, however it should be considered. In addition he's never had a cholesterol test and likely has some elevated cholesterol. From a coronary standpoint he reports after being questioned that he has recently had some chest discomfort, worsening shortness of breath and fatigue which he seemed to pass off to other issues. He is a smoker and has not yet been able to quit. He does seem to be interested in smoking cessation. He also reported that he recently traumatized his right knee and is wearing a brace. Findings indicate that he might need to have total knee replacement  in the near future.  06/27/2016  [name] returns today for follow-up of his studies. He underwent a nuclear stress test which was low risk and negative for ischemia however showed a reduced EF of 44%. A follow-up echo showed normal LV function and borderline diastolic dysfunction, suggesting that the EF on his nuclear stress test was abnormal due to likely gating abnormality. He did undergo additional blood work including workup for coagulation disorder. Ultimately, this did show an abnormal beta 2 glycoprotein IgG suggesting antiphospholipid antibodies. Lipid profile showed total cholesterol 149 contrast was 159, HDL 28 and LDL-C and 89. He is not on statin therapy although goal LDL cholesterol is less than 70.  04/03/2018  [name] is seen today for preoperative risk assessment.  He was last seen in February 2018 after follow-up of nuclear stress testing which was negative for ischemia however did showed a reduced EF of 44%.  Subsequently an echocardiogram showed normal LV function.  Since then he is done well but unfortunately struggling with neck pain.  He had prior neck surgery and now will have an additional neck surgery by Dr. Trenton Gammon.  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  He will need to hold his warfarin with bridging prior to surgery.  From a cardiac standpoint he denies chest pain or worsening shortness of breath, however his EKG today is abnormal demonstrating sinus rhythm with frequent PVCs in a pattern of bigeminy and rate of 65.  Reports no palpitations.  05/03/2018  [name] is seen today for follow-up of his echo.  This demonstrated a reduced LVEF  of 40 to 45%.  Interestingly in the past a year ago his echo was normal despite reduced EF of 44% on nuclear stress testing.  The nuclear stress test was negative for ischemia.  He has been having some chest discomfort.  He thinks he is also aware of his PVCs which were noted in a bigeminal pattern.   We discussed the possibility that his cardiomyopathy may be related to frequent PVCs or perhaps he has an ischemic cardiomyopathy causing the PVCs.  He does have a history of PAD and was noted to have coronary artery calcifications (although mild) by CT scan of the chest in February 2019.  06/13/2017  [name] is seen today in follow-up.  Fortunately his CT scan of the coronaries showed mild nonobstructive coronary disease with a low calcium score of 37.  This does not explain his cardiomyopathy.  I suspect this may be related to PVCs.  We will need to assess his PVC burden.  He seems to be tolerating carvedilol only at a low dose.  Additionally we may consider starting Entresto.   PMHx:  Past Medical History:  Diagnosis Date  . Anticoagulated on Coumadin   . Antiphospholipid antibody positive 06/27/2016   followed by dr kale ([facility])  . Anxiety with depression 06/24/2014  . Chronic back pain   . ED (erectile dysfunction)   . GERD (gastroesophageal reflux disease)   . Hemorrhoids, internal, with bleeding   . Hiatal hernia   . History of adenomatous polyp of colon    07-08-2016  tubular adenoma's  . History of atrial fibrillation    episode 04/ 2018  . History of DVT of lower extremity   . Hyperlipidemia   . Iron deficiency anemia due to chronic blood loss followed by dr Irene Limbo ([facility])   from hemorrhoids and on coumadin-- hx Iron infusion's and blood transfusion's x2 07-09-2016  . Neuropathy, peripheral    right foot numbness from lumbar pinched nerve  . OA (osteoarthritis)   . PAD (peripheral artery disease) ([place]) followed by dr Bridgett Larsson--  per LOV note ABI 11-14-2016  widely patent stent/  11-27-2016 per pt no claudication symptoms   hx right CIA angioplasty and stent 02-24-2016/  right CIA thromectomy and patch angioplasty for restenosis 07-18-2015  . Primary hypercoagulable state ([place]) [D68.59] 09/15/2016  . Recovering alcoholic in remission ([place])    per pt  since 04-25-2016  . S/P insertion of iliac artery stent    02-23-2017  right CIA PTA and stent/  05-11-2016  in-stent restenosis  s/p  thrombectomy/  07-17-2016 thrombectomy and  patch angioplasty right femoral artery   . Smokers' cough ([place])   . Wears glasses     Past Surgical History:  Procedure Laterality Date  . ANGIOPLASTY ILLIAC ARTERY Right 07/17/2016   Procedure: ANGIOPLASTY RIGHT COMMON ILIAC ARTERY;  Surgeon: Conrad Loraine, MD;  Location: [place];  Service: Vascular;  Laterality: Right;  . ANTERIOR CERVICAL DECOMP/DISCECTOMY FUSION  07/21/2010   C5 -- C7  . CARDIOVASCULAR STRESS TEST  06-14-2016   dr Nyah Shepherd   normal perfusion study w/ no reversible ischemia/  stress ef 44% but visually looks better , echo ordered (LVEF 30-44%)  , normal LV wall motion   . COLONOSCOPY N/A 07/08/2016   Procedure: COLONOSCOPY;  Surgeon: Clarene Essex, MD;  [facility]: [facility];  Service: Endoscopy;  Laterality: N/A;  . ESOPHAGOGASTRODUODENOSCOPY N/A 07/08/2016   Procedure: ESOPHAGOGASTRODUODENOSCOPY (EGD);  Surgeon: Clarene Essex, MD;  Location: [facility];  Service: Endoscopy;  Laterality: N/A;  . GROIN DEBRIDEMENT Right 07/25/2016   Procedure: EVACUATION HEMATOMA RIGHT GROIN;  Surgeon: Waynetta Sandy, MD;  Location: [place];  Service: Vascular;  Laterality: Right;  . HEMORRHOID SURGERY  2013  approx.   ?lanced/ banding  . HEMORRHOID SURGERY N/A 12/07/2016   Procedure: 3 COLUMN HEMORRHOIDECTOMY;  Surgeon: Leighton Ruff, MD;  Location: [facility];  Service: General;  Laterality: N/A;  . INTRAOPERATIVE ARTERIOGRAM Right 07/17/2016   Procedure: INTRA OPERATIVE ANGIOGRAM OF RIGHT COMMON ILIAC ARTERY;  Surgeon: Conrad Anderson, MD;  Location: [place];  Service: Vascular;  Laterality: Right;  . KNEE SURGERY Right 1983  . PATCH ANGIOPLASTY Right 07/17/2016   Procedure: PATCH ANGIOPLASTY RIGHT FEMORAL ARTERY USING Rueben Bash BIOLOGIC PATCH;  Surgeon: Conrad Oradell, MD;  Location: [place];  Service:  Vascular;  Laterality: Right;  . PERIPHERAL VASCULAR CATHETERIZATION N/A 02/24/2016   Procedure: Abdominal Aortogram w/Lower Extremity;  Surgeon: Conrad Parker, MD;  Location: [place];  Service: Cardiovascular;  Laterality: N/A;  . PERIPHERAL VASCULAR CATHETERIZATION Right 02/24/2016   Procedure: Peripheral Vascular Intervention;  Surgeon: Conrad Bishopville, MD;  Location: [facility];  Service: Cardiovascular;  Laterality: Right;  Common iliac  . PERIPHERAL VASCULAR CATHETERIZATION N/A 05/11/2016   Procedure: Abdominal Aortogram;  Surgeon: Conrad Deerfield, MD;  Location: [place] [facility];  Service: Cardiovascular;  Laterality: N/A;  . PERIPHERAL VASCULAR CATHETERIZATION N/A 05/11/2016   Procedure: Lower Extremity Angiography;  Surgeon: Conrad Cameron, MD;  Location: [facility] [facility];  Service: Cardiovascular;  Laterality: N/A;  . THROMBECTOMY FEMORAL ARTERY Right 07/17/2016   Procedure: THROMBECTOMY RIGHT FEMORAL ARTERY;  Surgeon: Conrad Lake Ka-Ho, MD;  Location: [facility];  Service: Vascular;  Laterality: Right;  . TRANSTHORACIC ECHOCARDIOGRAM  06-20-2016   dr Beonca Gibb   ef 55-60%,  grade 1 diastolic dysfunction/  mild TR    FAMHx:  Family History  Problem Relation Age of Onset  . Cancer Father        Lung    SOCHx:   reports that he has been smoking cigarettes and e-cigarettes. He has a 33.00 pack-year smoking history. He has never used smokeless tobacco. He reports that he does not drink alcohol or use drugs.  ALLERGIES:  Allergies  Allergen Reactions  . Cymbalta [Duloxetine Hcl] Other (See Comments)    Headaches    ROS: Pertinent items noted in HPI and remainder of comprehensive ROS otherwise negative.  HOME MEDS: Current Outpatient Medications on File Prior to Visit  Medication Sig Dispense Refill  . carvedilol (COREG) 3.125 MG tablet Take 1 tablet (3.125 mg total) by mouth 2 (two) times daily. 180 tablet 3  . cilostazol (PLETAL) 50 MG tablet Take 1 tablet (50 mg total) by  mouth 2 (two) times daily. 180 tablet 0  . esomeprazole (NEXIUM) 20 MG capsule Take 20 mg by mouth daily at 12 noon.    . gabapentin (NEURONTIN) 600 MG tablet Take 1 tablet (600 mg total) by mouth 3 (three) times daily. 270 tablet 3  . [START ON 07/09/2018] oxyCODONE-acetaminophen (PERCOCET) 10-325 MG tablet Take 1 tablet by mouth every 8 (eight) hours as needed for pain. 90 tablet 0  . oxyCODONE-acetaminophen (PERCOCET) 10-325 MG tablet Take 1 tablet by mouth every 8 (eight) hours as needed for pain. 90 tablet 0  . rosuvastatin (CRESTOR) 5 MG tablet Take 1 tablet (5 mg total) by mouth daily. 90 tablet 2  . sildenafil (VIAGRA)  25 MG tablet 2-3 tabs po x 1. Take 30 minutes to 4 hours prior to sexual activity. 21 tablet 2  . varenicline (CHANTIX CONTINUING MONTH PAK) 1 MG tablet Take 1 tablet (1 mg total) by mouth 2 (two) times daily. 270 tablet 0  . varenicline (CHANTIX STARTING MONTH PAK) 0.5 MG X 11 & 1 MG X 42 tablet Take one 0.5 mg tablet by mouth once daily for 3 days, then increase to one 0.5 mg tablet twice daily for 4 days, then increase to one 1 mg tablet twice daily. 53 tablet 0  . warfarin (COUMADIN) 1 MG tablet Take 1 tablet (1 mg total) by mouth daily. (Patient taking differently: Take 7 mg by mouth daily. ) 30 tablet 1  . warfarin (COUMADIN) 6 MG tablet Take 1 tablet (6 mg total) by mouth daily. 35 tablet 3   No current facility-administered medications on file prior to visit.     LABS/IMAGING: No results found for this or any previous visit (from the past 48 hour(s)). No results found.  WEIGHTS: Wt Readings from Last 3 Encounters:  06/13/18 174 lb 6.4 oz (79.1 kg)  05/10/18 172 lb 9.6 oz (78.3 kg)  05/03/18 169 lb 12.8 oz (77 kg)    VITALS: BP 118/76   Pulse 80   Ht 5\' 9"  (1.753 m)   Wt 174 lb 6.4 oz (79.1 kg)   BMI 25.75 kg/m   EXAM: Deferred  EKG: Deferred  ASSESSMENT: 1. New ischemic cardiomyopathy - LVEF 40-45% (04/2018) 2. Mild nonobstructive coronary  disease-CAC 37 (05/2018) 3. Newly identified ventricular bigeminy 4. Possible antiphospholipid antibody syndrome 5. Dyslipidemia 6. PAD with lifestyle limiting claudication status post right CIA stent 7. 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 Chest pain, progressive dyspnea and fatigue 9. Right knee injury 10. Tobacco abuse  PLAN: 1.   [name] had mild nonobstructive coronary disease by cath with a low coronary artery calcium score.  His cardiomyopathy could be related to ventricular bigeminy.  I like to place a 48-hour monitor to assess his burden of PVCs.  If this is greater than 15% would consider antiarrhythmic therapy or EP referral for evaluation and management.  Otherwise, we likely start low-dose Entresto in addition to his beta-blocker and repeat an echocardiogram in 6 months.  Follow-up 6 months.  Pixie Casino, MD, [facility], [place] Director of the Advanced Lipid Disorders &  Cardiovascular Risk Reduction Clinic Diplomate of the American Board of Clinical Lipidology Attending Cardiologist  Direct Dial: [phone]  Fax: [phone]  Website:  [email or web address]   Nadean Corwin Shawnie Nicole 06/13/2018, 2:42 PM

## 2018-06-13 NOTE — Patient Instructions (Signed)
Medication Instructions:  Continue current medications If you need a refill on your cardiac medications before your next appointment, please call your pharmacy.   Lab work: NONE If you have labs (blood work) drawn today and your tests are completely normal, you will receive your results only by: Marland Kitchen MyChart Message (if you have MyChart) OR . A paper copy in the mail If you have any lab test that is abnormal or we need to change your treatment, we will call you to review the results.  Testing/Procedures: Your physician has recommended that you wear a holter monitor for 48 hours. Holter monitors are medical devices that record the heart's electrical activity. Doctors most often use these monitors to diagnose arrhythmias. Arrhythmias are problems with the speed or rhythm of the heartbeat. The monitor is a small, portable device. You can wear one while you do your normal daily activities. This is usually used to diagnose what is causing palpitations/syncope (passing out). -- appointment will be at [address] - [address] Floor  Follow-Up: At [facility], you and your health needs are our priority.  As part of our continuing mission to provide you with exceptional heart care, we have created designated Provider Care Teams.  These Care Teams include your primary Cardiologist (physician) and Advanced Practice Providers (APPs -  Physician Assistants and Nurse Practitioners) who all work together to provide you with the care you need, when you need it. You will need a follow up appointment in 6 months with echocardiogram prior.  Please call our office 2 months in advance to schedule this appointment.  You may see Pixie Casino, MD or one of the following Advanced Practice Providers on your designated Care Team: [place], [place] . Fabian Sharp, PA-C  Any Other Special Instructions Will Be Listed Below (If Applicable).

## 2018-06-14 MED FILL — OXYCODONE-APAP 10-325: 10-325 | 30 days supply | Qty: 90 | Fill #0

## 2018-06-14 MED FILL — CHANTIX 1 MG TABLET: 1 | 84 days supply | Qty: 168 | Fill #0

## 2018-06-16 ENCOUNTER — Encounter: Payer: Self-pay | Admitting: Family Medicine

## 2018-06-17 ENCOUNTER — Other Ambulatory Visit: Payer: Self-pay

## 2018-06-17 DIAGNOSIS — D6859 Other primary thrombophilia: Secondary | ICD-10-CM

## 2018-06-17 MED ORDER — WARFARIN SODIUM 6 MG PO TABS: 6.0000 mg | ORAL_TABLET | Freq: Every day | ORAL | 1 refills | Status: DC

## 2018-06-17 MED FILL — WARFARIN SODIUM 6 MG TABLET: 6 | 90 days supply | Qty: 90 | Fill #0

## 2018-06-19 ENCOUNTER — Other Ambulatory Visit: Payer: Self-pay | Admitting: General Practice

## 2018-06-19 ENCOUNTER — Ambulatory Visit (HOSPITAL_COMMUNITY)
Admission: RE | Admit: 2018-06-19 | Discharge: 2018-06-19 | Disposition: A | Discharge status: Home / Self Care | Payer: 59 | Source: Ambulatory Visit | Attending: Family Medicine | Admitting: Family Medicine

## 2018-06-19 DIAGNOSIS — D500 Iron deficiency anemia secondary to blood loss (chronic): Secondary | ICD-10-CM

## 2018-06-19 DIAGNOSIS — D6859 Other primary thrombophilia: Secondary | ICD-10-CM

## 2018-06-19 MED ORDER — SODIUM CHLORIDE 0.9 % IV SOLN
INTRAVENOUS | Status: DC | PRN
  Administered 2018-06-19: 250 mL via INTRAVENOUS

## 2018-06-19 MED ORDER — WARFARIN SODIUM 6 MG PO TABS: ORAL_TABLET | ORAL | 1 refills | Status: DC

## 2018-06-19 MED ORDER — SODIUM CHLORIDE 0.9 % IV SOLN
510.0000 mg | Freq: Once | INTRAVENOUS | Status: AC
  Administered 2018-06-19: 510 mg via INTRAVENOUS
  Filled 2018-06-19: qty 17

## 2018-06-19 NOTE — Discharge Instructions (Signed)

## 2018-06-19 NOTE — Progress Notes (Signed)
Patient Care Center    Procedure: Feraheme  Note:  Patient received Feraheme infusion,tolerated the full amount. Post 30 minutes vital signs stable. Discharge instructions provided,verbalized understanding. Alert, oriented and ambulatory at discharge. 

## 2018-06-20 ENCOUNTER — Other Ambulatory Visit: Payer: Self-pay | Admitting: Neurosurgery

## 2018-06-21 ENCOUNTER — Telehealth: Payer: Self-pay | Admitting: *Deleted

## 2018-06-21 NOTE — Telephone Encounter (Signed)
   [facility] Pre-operative Risk Assessment    Request for surgical clearance:  1. What type of surgery is being performed? C5-6, C6-7 posterior cervical fusion   2. When is this surgery scheduled? TBD   3. What type of clearance is required (medical clearance vs. Pharmacy clearance to hold med vs. Both)? both  4. Are there any medications that need to be held prior to surgery and how long?warfarin   5. Practice name and name of physician performing surgery? [facility]   6. What is your office phone number 336 [phone]    7.   What is your office fax number [phone]  8.   Anesthesia type (None, local, MAC, general) ? general   Fredia Beets 06/21/2018, 7:27 AM  _________________________________________________________________   (provider comments below)

## 2018-06-24 ENCOUNTER — Ambulatory Visit (INDEPENDENT_AMBULATORY_CARE_PROVIDER_SITE_OTHER): Payer: 59 | Admitting: General Practice

## 2018-06-24 DIAGNOSIS — Z7901 Long term (current) use of anticoagulants: Secondary | ICD-10-CM | POA: Diagnosis not present

## 2018-06-24 DIAGNOSIS — D6859 Other primary thrombophilia: Secondary | ICD-10-CM

## 2018-06-24 LAB — POCT INR: INR: 2.1 (ref 2.0–3.0)

## 2018-06-24 NOTE — Patient Instructions (Addendum)
Pre visit review using our clinic review tool, if applicable. No additional management support is needed unless otherwise documented below in the visit note.  Take 9 mg today continue to take 6 mg daily except 7 mg on Tuesday.  Patient will be having surgery on 2/14 and will be bridged with Lovenox.  Tentative re-check date will be 2/21.  Instructions for coumadin and Lovenox pre and post procedure.  2/9 - Last dose of coumadin until after procedure 2/10 - Nothing (No coumadin and No Lovenox) 2/11 - Lovenox in the AM 2/12 - Lovenox in the AM 2/13 - Lovenox in the AM (Take by 7 am) 2/14 - Procedure (Do not take Lovenox today) 2/15 - Lovenox in the AM and 9 mg of coumadin (1 1/2 tablets) 2/16 - Lovenox in the AM and 9 mg of coumadin (1 1/2 tablets) 2/17 - Lovenox in the AM and 9 mg of coumadin (1 1/2 tablets) 2/18 - Lovenox in the AM and 9 mg of coumadin (1 1/2 tablets) 2/19 - Stop Lovenox and continue coumadin, 6 mg daily except 7 mg on Tuesdays  2/21 - [facility] at [facility]

## 2018-06-25 ENCOUNTER — Other Ambulatory Visit: Payer: Self-pay | Admitting: General Practice

## 2018-06-25 ENCOUNTER — Telehealth: Payer: Self-pay | Admitting: General Practice

## 2018-06-25 DIAGNOSIS — Z7901 Long term (current) use of anticoagulants: Secondary | ICD-10-CM

## 2018-06-25 MED ORDER — ENOXAPARIN SODIUM 120 MG/0.8ML ~~LOC~~ SOLN: 120.0000 mg | SUBCUTANEOUS | 0 refills | Status: DC

## 2018-06-25 MED FILL — ENOXAPARIN 120 MG/0.8 ML SY: 120 | 7 days supply | Qty: 6 | Fill #0

## 2018-06-25 NOTE — Telephone Encounter (Signed)
-----   Message from Briscoe Deutscher, DO sent at 06/25/2018  6:15 AM EST ----- Regarding: RE: Lovenox bridge Yes, thank you.  ----- Message ----- From: Warden Fillers, RN Sent: 06/24/2018   4:29 PM EST To: Briscoe Deutscher, DO Subject: Lovenox bridge                                 Dr. Juleen China,  Patient is having surgery on 2/14 that will require holding coumadin for 5 days prior.  He will need a Lovenox bridge.  Do I have your permission to dose and order the Lovenox?  Please advise.  Thanks, Villa Herb, RN

## 2018-06-27 NOTE — Pre-Procedure Instructions (Signed)
[name]  06/27/2018      [name], [place] [address] Phone: [phone] Fax: [phone]    Your procedure is scheduled on July 05, 2018.  Report to [facility] Admitting at 800 AM.  Call this number if you have problems the morning of surgery:  [phone]   Remember:  Do not eat or drink after midnight.   Take these medicines the morning of surgery with A SIP OF WATER  Carvedilol (coreg) Esomeprazole (nexium) Gabapentin (neurontin) Oxycodone-acetaminophen (percocet)-if needed  7 days prior to surgery STOP taking any Aspirin (unless otherwise instructed by your surgeon), Aleve, Naproxen, Ibuprofen, Motrin, Advil, Goody's, BC's, all herbal medications, fish oil, and all vitamins  Hold Coumadin 5 days prior to surgery per Dr. Anell Barr instructions (medical clearance) . Follow your surgeon's instructions on when to hold/resume Lovenox and Pletal.  If no instructions were given call the office to determine how they would like to you take Lovenox and Pletal    Do not wear jewelry  Do not wear lotions, powders, or colgones, or deodorant.  Men may shave face and neck.  Do not bring valuables to the hospital.  [facility] is not responsible for any belongings or valuables.  Contacts, dentures or bridgework may not be worn into surgery.  Leave your suitcase in the car.  After surgery it may be brought to your room.  For patients admitted to the hospital, discharge time will be determined by your treatment team.  Patients discharged the day of surgery will not be allowed to drive home.    [place]- Preparing For Surgery  Before surgery, you can play an important role. Because skin is not sterile, your skin needs to be as free of germs as possible. You can reduce the number of germs on your skin by washing with CHG (chlorahexidine gluconate) Soap before surgery.  CHG  is an antiseptic cleaner which kills germs and bonds with the skin to continue killing germs even after washing.    Oral Hygiene is also important to reduce your risk of infection.  Remember - BRUSH YOUR TEETH THE MORNING OF SURGERY WITH YOUR REGULAR TOOTHPASTE  Please do not use if you have an allergy to CHG or antibacterial soaps. If your skin becomes reddened/irritated stop using the CHG.  Do not shave (including legs and underarms) for at least 48 hours prior to first CHG shower. It is OK to shave your face.  Please follow these instructions carefully.   1. Shower the NIGHT BEFORE SURGERY and the MORNING OF SURGERY with CHG.   2. If you chose to wash your hair, wash your hair first as usual with your normal shampoo.  3. After you shampoo, rinse your hair and body thoroughly to remove the shampoo.  4. Use CHG as you would any other liquid soap. You can apply CHG directly to the skin and wash gently with a scrungie or a clean washcloth.   5. Apply the CHG Soap to your body ONLY FROM THE NECK DOWN.  Do not use on open wounds or open sores. Avoid contact with your eyes, ears, mouth and genitals (private parts). Wash Face and genitals (private parts)  with your normal soap.  6. Wash thoroughly, paying special attention to the area where your surgery will be performed.  7. Thoroughly rinse your body with warm water from the neck down.  8. DO NOT  shower/wash with your normal soap after using and rinsing off the CHG Soap.  9. Pat yourself dry with a CLEAN TOWEL.  10. Wear CLEAN PAJAMAS to bed the night before surgery, wear comfortable clothes the morning of surgery  11. Place CLEAN SHEETS on your bed the night of your first shower and DO NOT SLEEP WITH PETS.  Day of Surgery:  Do not apply any deodorants/lotions.  Please wear clean clothes to the hospital/surgery center.   Remember to brush your teeth WITH YOUR REGULAR TOOTHPASTE.   Please read over the following fact sheets that you  were given. Pain Booklet, Coughing and Deep Breathing, MRSA Information and Surgical Site Infection Prevention

## 2018-06-27 NOTE — Telephone Encounter (Signed)
Dr. Debara Pickett, pt is planning post. cervical fusion.  I see he has not had his holter monitor yet  - so A is pt stable for surgery and B-- what is timing?  Just return to pre- op pool

## 2018-06-28 ENCOUNTER — Encounter (HOSPITAL_COMMUNITY)
Admission: RE | Admit: 2018-06-28 | Discharge: 2018-06-28 | Disposition: A | Discharge status: Home / Self Care | Payer: 59 | Source: Ambulatory Visit | Attending: Neurosurgery | Admitting: Neurosurgery

## 2018-06-28 ENCOUNTER — Encounter (HOSPITAL_COMMUNITY): Payer: Self-pay

## 2018-06-28 ENCOUNTER — Other Ambulatory Visit: Payer: Self-pay

## 2018-06-28 DIAGNOSIS — Z01812 Encounter for preprocedural laboratory examination: Secondary | ICD-10-CM | POA: Diagnosis not present

## 2018-06-28 HISTORY — DX: Cardiac arrhythmia, unspecified: I49.9

## 2018-06-28 LAB — CBC WITH DIFFERENTIAL/PLATELET
Abs Immature Granulocytes: 0.15 10*3/uL — ABNORMAL HIGH (ref 0.00–0.07)
Basophils Absolute: 0.1 10*3/uL (ref 0.0–0.1)
Basophils Relative: 1 %
Eosinophils Absolute: 0.7 10*3/uL — ABNORMAL HIGH (ref 0.0–0.5)
Eosinophils Relative: 3 %
HCT: 51.4 % (ref 39.0–52.0)
Hemoglobin: 16.9 g/dL (ref 13.0–17.0)
Immature Granulocytes: 1 %
Lymphocytes Relative: 12 %
Lymphs Abs: 2.5 10*3/uL (ref 0.7–4.0)
MCH: 32.4 pg (ref 26.0–34.0)
MCHC: 32.9 g/dL (ref 30.0–36.0)
MCV: 98.7 fL (ref 80.0–100.0)
Monocytes Absolute: 0.8 10*3/uL (ref 0.1–1.0)
Monocytes Relative: 4 %
Neutro Abs: 17.2 10*3/uL — ABNORMAL HIGH (ref 1.7–7.7)
Neutrophils Relative %: 79 %
Platelets: 206 10*3/uL (ref 150–400)
RBC: 5.21 MIL/uL (ref 4.22–5.81)
RDW: 12.4 % (ref 11.5–15.5)
WBC: 21.5 10*3/uL — ABNORMAL HIGH (ref 4.0–10.5)
nRBC: 0 % (ref 0.0–0.2)

## 2018-06-28 LAB — TYPE AND SCREEN
ABO/RH(D): A POS
Antibody Screen: NEGATIVE

## 2018-06-28 LAB — BASIC METABOLIC PANEL
Anion gap: 13 (ref 5–15)
BUN: 8 mg/dL (ref 6–20)
CALCIUM: 9.5 mg/dL (ref 8.9–10.3)
CO2: 23 mmol/L (ref 22–32)
Chloride: 101 mmol/L (ref 98–111)
Creatinine, Ser: 1.08 mg/dL (ref 0.61–1.24)
GFR calc Af Amer: >60 mL/min (ref >60)
GFR calc non Af Amer: >60 mL/min (ref >60)
Glucose, Bld: 100 mg/dL — ABNORMAL HIGH (ref 70–99)
Potassium: 4.3 mmol/L (ref 3.5–5.1)
Sodium: 137 mmol/L (ref 135–145)

## 2018-06-28 LAB — SURGICAL PCR SCREEN
MRSA, PCR: NEGATIVE
Staphylococcus aureus: NEGATIVE

## 2018-06-28 NOTE — Progress Notes (Addendum)
PCP -  Dr Juleen China  Cardiologist - Dr Debara Pickett  Chest x-ray -   EKG - 04/03/2018  Stress Test - 06/14/2016  ECHO - 06/12/2017  Cardiac Cath - no  Sleep Study - no CPAP - no  LABS- WBC 21.5, Neut# 17.2- I left a voice message  [name], Dr. Marchelle Folks scheduler   ASA-na HA1C-na Fasting Blood Sugar - na Checks Blood Sugar ___na__ times a day  Anesthesia-  No chest pain currently -  Has  had lower chest and epigastric pain - can not say the last time. "I have GERD and PVCs - Dr Debara Pickett has said possible from PVCs."  [name] is Antiphospolid antibody positive was seen by Dr Irene Limbo - 04/12/2018 Dr Juleen China , patient's PCP manages Coumadin.  Dr Juleen China has given instructions on Lovenox bridge, patient is too stop Coumdin on 06/30/2018 and begin Lovenox on 07/02/2018.  Patient was not instructed to stop Pletal, I left a voice   message for [name] asking if he needs to stop.  Pt denies having chest pain, sob, or fever at this time. All instructions explained to the pt, with a verbal understanding of the material. Pt agrees to go over the instructions while at home for a better understanding. The opportunity to ask questions was provided.  Patient is on Chantix and is down to 1/2 pack cigarettes a day.  I instructed patient to not some 24 hours prior to surgery,

## 2018-06-29 NOTE — Telephone Encounter (Signed)
He is at acceptable risk for surgery, but should be noted he has new cardiomyopathy. Avoid volume overload. Ok to hold any antiplatelets before surgery if necessary.  Dr. Lemmie Evens

## 2018-07-01 NOTE — Anesthesia Preprocedure Evaluation (Addendum)
Anesthesia Evaluation  Patient identified by MRN, date of birth, ID band Patient awake    Reviewed: Allergy & Precautions, NPO status , Patient's Chart, lab work & pertinent test results, reviewed documented beta blocker date and time   History of Anesthesia Complications Negative for: history of anesthetic complications  Airway Mallampati: II  TM Distance: >3 FB Neck ROM: Full    Dental no notable dental hx. (+) Dental Advisory Given   Pulmonary neg pulmonary ROS, Current Smoker,    Pulmonary exam normal        Cardiovascular hypertension, Pt. on medications + Peripheral Vascular Disease and +CHF  Normal cardiovascular exam+ dysrhythmias Atrial Fibrillation   IMPRESSION: 1. Coronary calcium score of 37. This was 65 percentile for age and sex matched control.  2. Normal coronary origin with right dominance.  3. Mild non-obstructive plaque. Aggressive medical management is Recommended.  Study Conclusions  - Left ventricle: The cavity size was normal. Wall thickness was   normal. Systolic function was mildly to moderately reduced. The   estimated ejection fraction was in the range of 40% to 45%.   Diffuse hypokinesis. The study is not technically sufficient to   allow evaluation of LV diastolic function.  Impressions:  - Frequent ectopy noted throughout study; mild to moderate global   reduction in LV systolic function.   Neuro/Psych PSYCHIATRIC DISORDERS Anxiety Depression  Neuromuscular disease    GI/Hepatic Neg liver ROS, hiatal hernia, GERD  ,  Endo/Other  negative endocrine ROS  Renal/GU negative Renal ROS  negative genitourinary   Musculoskeletal negative musculoskeletal ROS (+)   Abdominal   Peds negative pediatric ROS (+)  Hematology negative hematology ROS (+)   Anesthesia Other Findings   Reproductive/Obstetrics negative OB ROS                                                             Anesthesia Evaluation  Patient identified by MRN, date of birth, ID band Patient awake    Reviewed: Allergy & Precautions, NPO status , Patient's Chart, lab work & pertinent test results  Airway Mallampati: II  TM Distance: >3 FB Neck ROM: Full    Dental no notable dental hx. (+) Teeth Intact, Dental Advisory Given, Caps,    Pulmonary neg pulmonary ROS, Current Smoker,    Pulmonary exam normal breath sounds clear to auscultation       Cardiovascular + Peripheral Vascular Disease  Normal cardiovascular exam Rhythm:Regular Rate:Normal  - Left ventricle: The cavity size was normal. Systolic function was   normal. The estimated ejection fraction was in the range of 55%   to 60%. Wall motion was normal; there were no regional wall   motion abnormalities. The study is not technically sufficient to   allow evaluation of LV diastolic function. Based on mitral inflow   patterns there appears to be grade 1 diastolic dysfunction.   However, based on tissue Doppler, diastolic function is normal. - Aortic valve: Transvalvular velocity was within the normal range.   There was no stenosis. There was no regurgitation. - Mitral valve: Transvalvular velocity was within the normal range.   There was no evidence for stenosis. There was no regurgitation. - Right ventricle: The cavity size was normal. Wall thickness was   normal. Systolic function was normal. - Pulmonic  valve: Transvalvular velocity was within the normal   range. There was no evidence for stenosis. - Pulmonary arteries: Systolic pressure was within the normal   range. PA peak pressure: 25 mm Hg (S).   Neuro/Psych negative neurological ROS  negative psych ROS   GI/Hepatic Neg liver ROS, GERD  ,Recovering alcoholic 83/09   Endo/Other  negative endocrine ROS  Renal/GU negative Renal ROS  negative genitourinary   Musculoskeletal negative musculoskeletal ROS (+)   Abdominal    Peds negative pediatric ROS (+)  Hematology Antiphospholipid antibody positive   Anesthesia Other Findings   Reproductive/Obstetrics negative OB ROS                            Anesthesia Physical Anesthesia Plan  ASA: III  Anesthesia Plan: MAC   Post-op Pain Management:    Induction: Intravenous  PONV Risk Score and Plan: 1 and Ondansetron and Dexamethasone  Airway Management Planned: Simple Face Mask  Additional Equipment:   Intra-op Plan:   Post-operative Plan:   Informed Consent: I have reviewed the patients History and Physical, chart, labs and discussed the procedure including the risks, benefits and alternatives for the proposed anesthesia with the patient or authorized representative who has indicated his/her understanding and acceptance.   Dental advisory given  Plan Discussed with: CRNA, Surgeon and Anesthesiologist  Anesthesia Plan Comments:        Anesthesia Quick Evaluation                                   Anesthesia Evaluation  Patient identified by MRN, date of birth, ID band Patient awake    Reviewed: Allergy & Precautions, NPO status , Patient's Chart, lab work & pertinent test results  Airway Mallampati: II  TM Distance: >3 FB Neck ROM: Full    Dental no notable dental hx. (+) Teeth Intact, Dental Advisory Given, Caps,    Pulmonary neg pulmonary ROS, Current Smoker,    Pulmonary exam normal breath sounds clear to auscultation       Cardiovascular + Peripheral Vascular Disease  Normal cardiovascular exam Rhythm:Regular Rate:Normal  - Left ventricle: The cavity size was normal. Systolic function was   normal. The estimated ejection fraction was in the range of 55%   to 60%. Wall motion was normal; there were no regional wall   motion abnormalities. The study is not technically sufficient to   allow evaluation of LV diastolic function. Based on mitral inflow   patterns there appears to be grade  1 diastolic dysfunction.   However, based on tissue Doppler, diastolic function is normal. - Aortic valve: Transvalvular velocity was within the normal range.   There was no stenosis. There was no regurgitation. - Mitral valve: Transvalvular velocity was within the normal range.   There was no evidence for stenosis. There was no regurgitation. - Right ventricle: The cavity size was normal. Wall thickness was   normal. Systolic function was normal. - Pulmonic valve: Transvalvular velocity was within the normal   range. There was no evidence for stenosis. - Pulmonary arteries: Systolic pressure was within the normal   range. PA peak pressure: 25 mm Hg (S).   Neuro/Psych negative neurological ROS  negative psych ROS   GI/Hepatic Neg liver ROS, GERD  ,Recovering alcoholic 40/76   Endo/Other  negative endocrine ROS  Renal/GU negative Renal ROS  negative genitourinary   Musculoskeletal  negative musculoskeletal ROS (+)   Abdominal   Peds negative pediatric ROS (+)  Hematology Antiphospholipid antibody positive   Anesthesia Other Findings   Reproductive/Obstetrics negative OB ROS                            Anesthesia Physical Anesthesia Plan  ASA: III  Anesthesia Plan: MAC   Post-op Pain Management:    Induction: Intravenous  PONV Risk Score and Plan: 1 and Ondansetron and Dexamethasone  Airway Management Planned: Simple Face Mask  Additional Equipment:   Intra-op Plan:   Post-operative Plan:   Informed Consent: I have reviewed the patients History and Physical, chart, labs and discussed the procedure including the risks, benefits and alternatives for the proposed anesthesia with the patient or authorized representative who has indicated his/her understanding and acceptance.   Dental advisory given  Plan Discussed with: CRNA, Surgeon and Anesthesiologist  Anesthesia Plan Comments:        Anesthesia Quick Evaluation  Anesthesia  Physical Anesthesia Plan  ASA: III  Anesthesia Plan: General   Post-op Pain Management:    Induction:   PONV Risk Score and Plan: 3 and Ondansetron, Dexamethasone and Scopolamine patch - Pre-op  Airway Management Planned: Oral ETT  Additional Equipment:   Intra-op Plan:   Post-operative Plan: Extubation in OR  Informed Consent: I have reviewed the patients History and Physical, chart, labs and discussed the procedure including the risks, benefits and alternatives for the proposed anesthesia with the patient or authorized representative who has indicated his/her understanding and acceptance.     Dental advisory given  Plan Discussed with: CRNA and Anesthesiologist  Anesthesia Plan Comments: (PAT note written 07/01/2018 by Myra Gianotti, PA-C. For STAT CBC and PT/INR on arrival day of surgery. )       Anesthesia Quick Evaluation

## 2018-07-01 NOTE — Progress Notes (Addendum)
Anesthesia Chart Review:  Case:  [id number] Date/Time:  07/05/18 0951   Procedure:  Posterior cervical fusion with lateral mass fixation - C5-C6 - C6-C7 (N/A )   Anesthesia type:  General   Pre-op diagnosis:  Pseudoarthrosis   Location:  [facility] OR ROOM 29 / [facility]   Surgeon:  Earnie Larsson, MD      DISCUSSION: Patient is a 53 year old male scheduled for the above procedure.  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   According to 06/29/18 notation by Lyman Bishop, MD, "He is at acceptable risk for surgery, but should be noted he has new cardiomyopathy. Avoid volume overload. Ok to hold any antiplatelets before surgery if necessary."  He also has vascular and primary care preoperative risk assessment as outlined below. Patient's last warfarin 06/30/18 with Lovenox bridge starting 07/02/18. He is suppose to hold Pletal for surgery.   Preoperative labs showed leukocytosis with WBC 21.5, neutrophil 79%, # 17.2 H (1.7-7.7). No fever or SOB at PAT. Dr. Annette Stable has already reviewed and requests repeat CBC on the day of surgery. Lorriane Shire at his office is also going to speak with patient to confirm Pletal instructions (as he was unsure at PAT visit) and inquire about any sick symptoms.    He is for STAT CBC and PT/INR on the day of surgery. Anesthesiologist and  surgeon to evaluate and definitive plan at that time.   VS: BP 122/68   Pulse (!) 59   Temp 36.9 C   Resp 18   Ht 5\' 9"  (1.753 m)   Wt 77.4 kg   SpO2 100%   BMI 25.21 kg/m   PROVIDERS: Briscoe Deutscher, DO is PCP. She felt patient was moderate risk for surgery and recommended Lovenox bridge. Lyman Bishop, MD is cardiologist. Last visit 06/13/18.  Sullivan Lone, MD is HEM-ONC. Last visit 04/09/18. By notes, patient had elevated beta-2 glycoprotein IgG concerning for antiphospholipid syndrome. There was enough concern recommendation to treated as APLA syndrome. Patient has been on warfarin. PRN HEM follow-up.  [name], MD was vascular surgeon, but relocated last year. Last visit 10/17/17. Monica Martinez, MD signed a note of vascular clearance for surgery at "moderate risk,.   LABS: Preoperative labs noted. WBC 21.5, neutrophil 79%, # 17.2 H (1.7-7.7). INR 2.1. Labs discussed above.  (all labs ordered are listed, but only abnormal results are displayed)  Labs Reviewed  BASIC METABOLIC PANEL - Abnormal; Notable for the following components:      Result Value   Glucose, Bld 100 (*)    All other components within normal limits  CBC WITH DIFFERENTIAL/PLATELET - Abnormal; Notable for the following components:   WBC 21.5 (*)    Neutro Abs 17.2 (*)    Eosinophils Absolute 0.7 (*)    Abs Immature Granulocytes 0.15 (*)    All other components within normal limits  SURGICAL PCR SCREEN  TYPE AND SCREEN    IMAGES: CT C-spine 01/23/18: IMPRESSION: 1. Fusion at C5-6 and C6-7 with  decompression of the central canal. 2. Residual foraminal narrowing bilaterally at C5-6 due to uncovertebral spurring, right greater than left. 3. Progressive right central canal stenosis at C3-4 secondary to a rightward disc osteophyte complex. 4. Mild right foraminal narrowing at C3-4.  CT chest 07/10/17: IMPRESSION: 1. Underlying centrilobular and mild paraseptal emphysematous change. No edema  or consolidation. No pleural effusion. Areas of mild scarring, most notably in the superior segment left lower lobe. 2. Small nodular opacities left upper lobe posteriorly, largest measuring 2 mm. No follow-up needed if patient is low-risk (and has no known or suspected primary neoplasm). Non-contrast chest CT can be considered in 12 months if patient is high-risk. This recommendation follows the consensus statement: Guidelines for Management of Incidental Pulmonary Nodules Detected on CT Images: From the Fleischner Society 2017; Radiology 2017; 284:228-243. 3.  Mild lower lobe bronchiectatic change bilaterally. 4.  No evident adenopathy. 5.  Small hiatal hernia. 6. Foci of aortic atherosclerosis. Minimal coronary artery calcification.   EKG: 04/03/18: SR with frequent PVCs in a pattern of bigeminy. Minimal voltage criteria for LVH, may be normal variant.   CV: CT coronary 05/30/18: IMPRESSION: 1. Coronary calcium score of 37. This was 89 percentile for age and sex matched control. 2. Normal coronary origin with right dominance. 3. Mild non-obstructive plaque (0-25% distal LM, 25-50% mid LAD). Aggressive medical management is recommended.  Echo 04/12/18: Study Conclusions - Left ventricle: The cavity size was normal. Wall thickness was   normal. Systolic function was mildly to moderately reduced. The   estimated ejection fraction was in the range of 40% to 45%.   Diffuse hypokinesis. The study is not technically sufficient to   allow evaluation of LV diastolic function. Impressions: - Frequent ectopy noted throughout study; mild to moderate global   reduction in LV systolic function. (Comparison 06/20/16: LVEF 55-60%, PA peak pressure 25 mmHg.)  Nuclear stress test 06/14/16:  The left ventricular ejection fraction is moderately decreased (30-44%).  Nuclear stress EF: 44%.  No T wave inversion was noted during stress.  There was no ST segment deviation noted during  stress.  This is an intermediate risk study. No reversible ischemia. LVEF calculated at 44% but visually looks better- consider limited echo to re-assess. This is an intermediate risk study due to decreased LV function.   Past Medical History:  Diagnosis Date  . Anticoagulated on Coumadin   . Antiphospholipid antibody positive 06/27/2016   followed by dr kale ([facility])  . Anxiety with depression 06/24/2014  . Chronic back pain   . Dysrhythmia    PVCs  . ED (erectile dysfunction)   . GERD (gastroesophageal reflux disease)   . Hemorrhoids, internal, with bleeding   . Hiatal hernia   . History of adenomatous polyp of colon    07-08-2016  tubular adenoma's  . History of atrial fibrillation    episode 04/ 2018  . History of DVT of lower extremity   . Hyperlipidemia   . Iron deficiency anemia due to chronic blood loss followed by dr Irene Limbo ([facility])   from hemorrhoids and on coumadin-- hx Iron infusion's and blood transfusion's x2 07-09-2016  . Neuropathy, peripheral    right foot numbness from lumbar pinched nerve  . OA (osteoarthritis)   . PAD (peripheral artery disease) ([place]) followed by dr Bridgett Larsson--  per LOV note ABI 11-14-2016  widely patent stent/  11-27-2016 per pt no claudication symptoms   hx right CIA angioplasty and stent 02-24-2016/  right CIA thromectomy  and patch angioplasty for restenosis 07-18-2015  . Primary hypercoagulable state ([place]) [D68.59] 09/15/2016  . Recovering alcoholic in remission ([place])    per pt since 04-25-2016  . S/P insertion of iliac artery stent    02-23-2017  right CIA PTA and stent/  05-11-2016  in-stent restenosis  s/p  thrombectomy/  07-17-2016 thrombectomy and  patch angioplasty right femoral artery   . Smokers' cough ([place])   . Wears glasses     Past Surgical History:  Procedure Laterality Date  . ANGIOPLASTY ILLIAC ARTERY Right 07/17/2016   Procedure: ANGIOPLASTY RIGHT COMMON ILIAC ARTERY;  Surgeon: Conrad Etowah, MD;   Location: [facility];  Service: Vascular;  Laterality: Right;  . ANTERIOR CERVICAL DECOMP/DISCECTOMY FUSION  07/21/2010   C5 -- C7  . CARDIOVASCULAR STRESS TEST  06-14-2016   dr hilty   normal perfusion study w/ no reversible ischemia/  stress ef 44% but visually looks better , echo ordered (LVEF 30-44%)  , normal LV wall motion   . COLONOSCOPY N/A 07/08/2016   Procedure: COLONOSCOPY;  Surgeon: Clarene Essex, MD;  Location: [facility];  Service: Endoscopy;  Laterality: N/A;  . ESOPHAGOGASTRODUODENOSCOPY N/A 07/08/2016   Procedure: ESOPHAGOGASTRODUODENOSCOPY (EGD);  Surgeon: Clarene Essex, MD;  Location: [facility];  Service: Endoscopy;  Laterality: N/A;  . GROIN DEBRIDEMENT Right 07/25/2016   Procedure: EVACUATION HEMATOMA RIGHT GROIN;  Surgeon: Waynetta Sandy, MD;  Location: [facility];  Service: Vascular;  Laterality: Right;  . HEMORRHOID SURGERY  2013  approx.   ?lanced/ banding  . HEMORRHOID SURGERY N/A 12/07/2016   Procedure: 3 COLUMN HEMORRHOIDECTOMY;  Surgeon: Leighton Ruff, MD;  Location: [facility];  Service: General;  Laterality: N/A;  . INTRAOPERATIVE ARTERIOGRAM Right 07/17/2016   Procedure: INTRA OPERATIVE ANGIOGRAM OF RIGHT COMMON ILIAC ARTERY;  Surgeon: Conrad [place]ville, MD;  Location: [facility];  Service: Vascular;  Laterality: Right;  . KNEE SURGERY Right 1983   cartilage  . PATCH ANGIOPLASTY Right 07/17/2016   Procedure: PATCH ANGIOPLASTY RIGHT FEMORAL ARTERY USING Rueben Bash BIOLOGIC PATCH;  Surgeon: Conrad [place], MD;  Location: [facility];  Service: Vascular;  Laterality: Right;  . PERIPHERAL VASCULAR CATHETERIZATION N/A 02/24/2016   Procedure: Abdominal Aortogram w/Lower Extremity;  Surgeon: Conrad Manor, MD;  Location: [facility];  Service: Cardiovascular;  Laterality: N/A;  . PERIPHERAL VASCULAR CATHETERIZATION Right 02/24/2016   Procedure: Peripheral Vascular Intervention;  Surgeon: Conrad Cl[place], MD;  Location: [facility];  Service: Cardiovascular;  Laterality:  Right;  Common iliac  . PERIPHERAL VASCULAR CATHETERIZATION N/A 05/11/2016   Procedure: Abdominal Aortogram;  Surgeon: Conrad D'Iberville, MD;  Location: [facility];  Service: Cardiovascular;  Laterality: N/A;  . PERIPHERAL VASCULAR CATHETERIZATION N/A 05/11/2016   Procedure: Lower Extremity Angiography;  Surgeon: Conrad [place], MD;  Location: [facility];  Service: Cardiovascular;  Laterality: N/A;  . THROMBECTOMY FEMORAL ARTERY Right 07/17/2016   Procedure: THROMBECTOMY RIGHT FEMORAL ARTERY;  Surgeon: Conrad , MD;  Location: [facility];  Service: Vascular;  Laterality: Right;  . TRANSTHORACIC ECHOCARDIOGRAM  06-20-2016   dr hilty   ef 55-60%,  grade 1 diastolic dysfunction/  mild TR    MEDICATIONS: . carvedilol (COREG) 3.125 MG tablet  . cilostazol (PLETAL) 50 MG tablet  . enoxaparin (LOVENOX) 120 MG/0.8ML injection  . esomeprazole (NEXIUM) 20 MG capsule  . gabapentin (NEURONTIN) 600 MG tablet  . [START ON 07/09/2018] oxyCODONE-acetaminophen (PERCOCET) 10-325 MG tablet  . oxyCODONE-acetaminophen (PERCOCET) 10-325  MG tablet  . rosuvastatin (CRESTOR) 5 MG tablet  . sildenafil (VIAGRA) 25 MG tablet  . varenicline (CHANTIX CONTINUING MONTH PAK) 1 MG tablet  . varenicline (CHANTIX STARTING MONTH PAK) 0.5 MG X 11 & 1 MG X 42 tablet  . warfarin (COUMADIN) 1 MG tablet  . warfarin (COUMADIN) 6 MG tablet   No current facility-administered medications for this encounter.     Myra Gianotti, PA-C Surgical Short Stay/Anesthesiology [facility] Phone [phone] [facility] Phone [phone] 07/01/2018 11:55 AM

## 2018-07-01 NOTE — Telephone Encounter (Signed)
Spoke with Surgery scheduler at [facility], she stated cardiac clearance has been received.

## 2018-07-01 NOTE — Telephone Encounter (Signed)
   Primary Cardiologist: Pixie Casino, MD  Chart reviewed as part of pre-operative protocol coverage. Given past medical history and time since last visit, based on ACC/AHA guidelines, Berlyn Malina would be at acceptable risk for the planned procedure without further cardiovascular testing. This case has been reviewed by Dr. Debara Pickett who has cleared the patient. Since his ejection fraction is mildly down 40-45%, be careful to avoid very aggressive IV hydration which can lead to pulmonary edema in his case.   I will route this recommendation to the requesting party via Epic fax function and remove from pre-op pool.  Please call with questions. His coumadin is being managed by his primary care provider.  Almyra Deforest, [place] 07/01/2018, 8:37 AM

## 2018-07-01 NOTE — Telephone Encounter (Signed)
Left detailed message on patients machine informing him that cardiac clearance has been given and a copy of the clearance note will be faxed to his surgeon. Advised patient to contact office with any questions or concerns.

## 2018-07-01 NOTE — Progress Notes (Signed)
Received a call from Dominica at Dr. Marchelle Folks office. Pt will need a repeat STAT CBC on DOS d/t WBC count being elevated at PAT appointment.   Jacqlyn Larsen, RN

## 2018-07-02 ENCOUNTER — Ambulatory Visit (INDEPENDENT_AMBULATORY_CARE_PROVIDER_SITE_OTHER): Payer: 59

## 2018-07-02 DIAGNOSIS — I493 Ventricular premature depolarization: Secondary | ICD-10-CM

## 2018-07-03 ENCOUNTER — Other Ambulatory Visit: Payer: Self-pay | Admitting: *Deleted

## 2018-07-03 NOTE — Patient Outreach (Signed)
[facility]) Care Management  07/03/2018  [name] 10-22-1965 [id number]   Preoperative Screening Call Referral received: 06/25/18 Surgery date: 07/05/18 Insurance: [employer] Save Plan  Subjective:  Initial successful telephone call to patient's preferred number (home) in order to complete preoperative screening. 2 HIPAA identifiers verified. Discussed purpose of preoperative call. Patient voices understanding and agrees to call. He states she understands the reasons for the surgery and the expected time of arrival. He says he has a question about whether he is to take his beta blocker in the morning and has left a message with his surgeon's office this morning and is waiting for a return call. He says he expects to be in the hospital for one overnight stay. He state she is currently not working so he did not need to complete paperwork for medical leave. He does not know if he has the  hospital indemnity benefit so he was advised to check with his wife as he or she will have to file the claim after her surgery. He says he will have 24/7 care at home to assist in his recovery. He does not have and is not interested in completing advanced directives at this time. He agrees to a post hospital discharge transition of care call.    Objective:  Per chart review, patient scheduled for posterior cervical fusion with lateral mass fixation of C5-C6, C6-C7 on 07/05/18 at [facility].   Assessment: Preoperative call completed, no preoperative needs identified.   Plan: RNCM will call patient for transition of care outreach within 72 hours of hospital discharge notification.  Barrington Ellison RN,CCM,CDE [place] Management Coordinator Office Phone [phone] Office Fax [phone]

## 2018-07-05 ENCOUNTER — Inpatient Hospital Stay (HOSPITAL_COMMUNITY): Payer: 59

## 2018-07-05 ENCOUNTER — Encounter (HOSPITAL_COMMUNITY)
Admission: RE | Disposition: A | Discharge status: Home / Self Care | Payer: Self-pay | Source: Home / Self Care | Attending: Neurosurgery

## 2018-07-05 ENCOUNTER — Encounter (HOSPITAL_COMMUNITY): Payer: Self-pay | Admitting: Certified Registered Nurse Anesthetist

## 2018-07-05 ENCOUNTER — Inpatient Hospital Stay (HOSPITAL_COMMUNITY): Payer: 59 | Admitting: Vascular Surgery

## 2018-07-05 ENCOUNTER — Inpatient Hospital Stay (HOSPITAL_COMMUNITY)
Admission: RE | Admit: 2018-07-05 | Discharge: 2018-07-06 | DRG: 472 | Disposition: A | Discharge status: Home / Self Care | Payer: 59 | Attending: Neurosurgery | Admitting: Neurosurgery

## 2018-07-05 ENCOUNTER — Other Ambulatory Visit: Payer: Self-pay

## 2018-07-05 DIAGNOSIS — M960 Pseudarthrosis after fusion or arthrodesis: Secondary | ICD-10-CM | POA: Diagnosis not present

## 2018-07-05 DIAGNOSIS — Z7901 Long term (current) use of anticoagulants: Secondary | ICD-10-CM | POA: Diagnosis not present

## 2018-07-05 DIAGNOSIS — I739 Peripheral vascular disease, unspecified: Secondary | ICD-10-CM | POA: Diagnosis present

## 2018-07-05 DIAGNOSIS — Z801 Family history of malignant neoplasm of trachea, bronchus and lung: Secondary | ICD-10-CM | POA: Diagnosis not present

## 2018-07-05 DIAGNOSIS — Z79899 Other long term (current) drug therapy: Secondary | ICD-10-CM

## 2018-07-05 DIAGNOSIS — I509 Heart failure, unspecified: Secondary | ICD-10-CM | POA: Diagnosis present

## 2018-07-05 DIAGNOSIS — Z419 Encounter for procedure for purposes other than remedying health state, unspecified: Secondary | ICD-10-CM

## 2018-07-05 DIAGNOSIS — T84216A Breakdown (mechanical) of internal fixation device of vertebrae, initial encounter: Secondary | ICD-10-CM | POA: Diagnosis present

## 2018-07-05 DIAGNOSIS — I110 Hypertensive heart disease with heart failure: Secondary | ICD-10-CM | POA: Diagnosis present

## 2018-07-05 DIAGNOSIS — I482 Chronic atrial fibrillation, unspecified: Secondary | ICD-10-CM | POA: Diagnosis present

## 2018-07-05 DIAGNOSIS — S129XXA Fracture of neck, unspecified, initial encounter: Secondary | ICD-10-CM | POA: Diagnosis present

## 2018-07-05 DIAGNOSIS — I4891 Unspecified atrial fibrillation: Secondary | ICD-10-CM | POA: Diagnosis not present

## 2018-07-05 DIAGNOSIS — M4322 Fusion of spine, cervical region: Secondary | ICD-10-CM | POA: Diagnosis not present

## 2018-07-05 DIAGNOSIS — D6859 Other primary thrombophilia: Secondary | ICD-10-CM

## 2018-07-05 DIAGNOSIS — F17210 Nicotine dependence, cigarettes, uncomplicated: Secondary | ICD-10-CM | POA: Diagnosis present

## 2018-07-05 DIAGNOSIS — E785 Hyperlipidemia, unspecified: Secondary | ICD-10-CM | POA: Diagnosis present

## 2018-07-05 DIAGNOSIS — D500 Iron deficiency anemia secondary to blood loss (chronic): Secondary | ICD-10-CM

## 2018-07-05 HISTORY — PX: POSTERIOR CERVICAL FUSION/FORAMINOTOMY: SHX5038

## 2018-07-05 LAB — PROTIME-INR
INR: 1.11
Prothrombin Time: 14.2 seconds (ref 11.4–15.2)

## 2018-07-05 LAB — CBC
HCT: 47.8 % (ref 39.0–52.0)
Hemoglobin: 16.7 g/dL (ref 13.0–17.0)
MCH: 34 pg (ref 26.0–34.0)
MCHC: 34.9 g/dL (ref 30.0–36.0)
MCV: 97.4 fL (ref 80.0–100.0)
Platelets: 150 10*3/uL (ref 150–400)
RBC: 4.91 MIL/uL (ref 4.22–5.81)
RDW: 12.7 % (ref 11.5–15.5)
WBC: 15 10*3/uL — ABNORMAL HIGH (ref 4.0–10.5)
nRBC: 0 % (ref 0.0–0.2)

## 2018-07-05 SURGERY — POSTERIOR CERVICAL FUSION/FORAMINOTOMY LEVEL 2
Anesthesia: General | Site: Spine Cervical

## 2018-07-05 MED ORDER — ROSUVASTATIN CALCIUM 5 MG PO TABS
5.0000 mg | ORAL_TABLET | Freq: Every day | ORAL | Status: DC
  Administered 2018-07-05 – 2018-07-06 (×2): 5 mg via ORAL
  Filled 2018-07-05 (×2): qty 1

## 2018-07-05 MED ORDER — CARVEDILOL 3.125 MG PO TABS: 3.1250 mg | ORAL_TABLET | Freq: Two times a day (BID) | ORAL | Status: DC

## 2018-07-05 MED ORDER — BUPIVACAINE HCL (PF) 0.25 % IJ SOLN
INTRAMUSCULAR | Status: DC | PRN
  Administered 2018-07-05: 20 mL

## 2018-07-05 MED ORDER — THROMBIN 20000 UNITS EX SOLR
CUTANEOUS | Status: DC | PRN
  Administered 2018-07-05: 20 mL via TOPICAL

## 2018-07-05 MED ORDER — SODIUM CHLORIDE 0.9% IV SOLUTION: Freq: Once | INTRAVENOUS | Status: DC

## 2018-07-05 MED ORDER — DEXAMETHASONE SODIUM PHOSPHATE 10 MG/ML IJ SOLN
INTRAMUSCULAR | Status: DC | PRN
  Administered 2018-07-05: 5 mg via INTRAVENOUS

## 2018-07-05 MED ORDER — MIDAZOLAM HCL 5 MG/5ML IJ SOLN
INTRAMUSCULAR | Status: DC | PRN
  Administered 2018-07-05: 2 mg via INTRAVENOUS

## 2018-07-05 MED ORDER — CEFAZOLIN SODIUM-DEXTROSE 2-4 GM/100ML-% IV SOLN
INTRAVENOUS | Status: AC
  Filled 2018-07-05: qty 100

## 2018-07-05 MED ORDER — ACETAMINOPHEN 650 MG RE SUPP: 650.0000 mg | RECTAL | Status: DC | PRN

## 2018-07-05 MED ORDER — ONDANSETRON HCL 4 MG PO TABS: 4.0000 mg | ORAL_TABLET | Freq: Four times a day (QID) | ORAL | Status: DC | PRN

## 2018-07-05 MED ORDER — PHENOL 1.4 % MT LIQD: 1.0000 | OROMUCOSAL | Status: DC | PRN

## 2018-07-05 MED ORDER — DEXAMETHASONE SODIUM PHOSPHATE 10 MG/ML IJ SOLN
INTRAMUSCULAR | Status: AC
  Filled 2018-07-05: qty 1

## 2018-07-05 MED ORDER — ACETAMINOPHEN 500 MG PO TABS
1000.0000 mg | ORAL_TABLET | Freq: Once | ORAL | Status: AC
  Administered 2018-07-05: 1000 mg via ORAL

## 2018-07-05 MED ORDER — PROPOFOL 10 MG/ML IV BOLUS
INTRAVENOUS | Status: AC
  Filled 2018-07-05: qty 20

## 2018-07-05 MED ORDER — HYDROCODONE-ACETAMINOPHEN 10-325 MG PO TABS: 1.0000 | ORAL_TABLET | ORAL | Status: DC | PRN

## 2018-07-05 MED ORDER — LIDOCAINE 20MG/ML (2%) 15 ML SYRINGE OPTIME
INTRAMUSCULAR | Status: DC | PRN
  Administered 2018-07-05: 100 mg via INTRAVENOUS

## 2018-07-05 MED ORDER — CEFAZOLIN SODIUM-DEXTROSE 1-4 GM/50ML-% IV SOLN
1.0000 g | Freq: Three times a day (TID) | INTRAVENOUS | Status: AC
  Administered 2018-07-05 (×2): 1 g via INTRAVENOUS
  Filled 2018-07-05 (×2): qty 50

## 2018-07-05 MED ORDER — DEXAMETHASONE SODIUM PHOSPHATE 10 MG/ML IJ SOLN: 10.0000 mg | INTRAMUSCULAR | Status: DC

## 2018-07-05 MED ORDER — HYDROMORPHONE HCL 1 MG/ML IJ SOLN
0.2500 mg | INTRAMUSCULAR | Status: DC | PRN
  Administered 2018-07-05 (×4): 0.5 mg via INTRAVENOUS

## 2018-07-05 MED ORDER — KETOROLAC TROMETHAMINE 30 MG/ML IJ SOLN
INTRAMUSCULAR | Status: AC
  Filled 2018-07-05: qty 1

## 2018-07-05 MED ORDER — SODIUM CHLORIDE 0.9 % IV SOLN
INTRAVENOUS | Status: DC | PRN
  Administered 2018-07-05: 500 mL

## 2018-07-05 MED ORDER — BACITRACIN ZINC 500 UNIT/GM EX OINT
TOPICAL_OINTMENT | CUTANEOUS | Status: AC
  Filled 2018-07-05: qty 28.35

## 2018-07-05 MED ORDER — GLYCOPYRROLATE 0.2 MG/ML IJ SOLN
INTRAMUSCULAR | Status: DC | PRN
  Administered 2018-07-05: 0.1 mg via INTRAVENOUS

## 2018-07-05 MED ORDER — KETAMINE HCL 50 MG/5ML IJ SOSY
PREFILLED_SYRINGE | INTRAMUSCULAR | Status: AC
  Filled 2018-07-05: qty 10

## 2018-07-05 MED ORDER — 0.9 % SODIUM CHLORIDE (POUR BTL) OPTIME
TOPICAL | Status: DC | PRN
  Administered 2018-07-05: 1000 mL

## 2018-07-05 MED ORDER — CARVEDILOL 3.125 MG PO TABS
3.1250 mg | ORAL_TABLET | Freq: Once | ORAL | Status: AC
  Administered 2018-07-05: 3.125 mg via ORAL

## 2018-07-05 MED ORDER — BACITRACIN ZINC 500 UNIT/GM EX OINT
TOPICAL_OINTMENT | CUTANEOUS | Status: DC | PRN
  Administered 2018-07-05: 1 via TOPICAL

## 2018-07-05 MED ORDER — VARENICLINE TARTRATE 1 MG PO TABS
1.0000 mg | ORAL_TABLET | Freq: Two times a day (BID) | ORAL | Status: DC
  Administered 2018-07-05 – 2018-07-06 (×3): 1 mg via ORAL
  Filled 2018-07-05 (×3): qty 1

## 2018-07-05 MED ORDER — HYDROMORPHONE HCL 1 MG/ML IJ SOLN
INTRAMUSCULAR | Status: AC
  Filled 2018-07-05: qty 1

## 2018-07-05 MED ORDER — FLEET ENEMA 7-19 GM/118ML RE ENEM: 1.0000 | ENEMA | Freq: Once | RECTAL | Status: DC | PRN

## 2018-07-05 MED ORDER — PROPOFOL 10 MG/ML IV BOLUS
INTRAVENOUS | Status: DC | PRN
  Administered 2018-07-05 (×4): 50 mg via INTRAVENOUS
  Administered 2018-07-05: 100 mg via INTRAVENOUS

## 2018-07-05 MED ORDER — CARVEDILOL 3.125 MG PO TABS
ORAL_TABLET | ORAL | Status: AC
  Administered 2018-07-05: 3.125 mg via ORAL
  Filled 2018-07-05: qty 1

## 2018-07-05 MED ORDER — CARVEDILOL 3.125 MG PO TABS
3.1250 mg | ORAL_TABLET | Freq: Two times a day (BID) | ORAL | Status: DC
  Administered 2018-07-05: 3.125 mg via ORAL
  Filled 2018-07-05 (×2): qty 1

## 2018-07-05 MED ORDER — MENTHOL 3 MG MT LOZG: 1.0000 | LOZENGE | OROMUCOSAL | Status: DC | PRN

## 2018-07-05 MED ORDER — CEFAZOLIN SODIUM-DEXTROSE 2-4 GM/100ML-% IV SOLN
2.0000 g | INTRAVENOUS | Status: AC
  Administered 2018-07-05: 2 g via INTRAVENOUS

## 2018-07-05 MED ORDER — LIDOCAINE 2% (20 MG/ML) 5 ML SYRINGE
INTRAMUSCULAR | Status: AC
  Filled 2018-07-05: qty 5

## 2018-07-05 MED ORDER — FENTANYL CITRATE (PF) 100 MCG/2ML IJ SOLN
INTRAMUSCULAR | Status: DC | PRN
  Administered 2018-07-05 (×8): 50 ug via INTRAVENOUS

## 2018-07-05 MED ORDER — OXYCODONE HCL 5 MG PO TABS
ORAL_TABLET | ORAL | Status: AC
  Filled 2018-07-05: qty 2

## 2018-07-05 MED ORDER — HYDROMORPHONE HCL 1 MG/ML IJ SOLN
1.0000 mg | INTRAMUSCULAR | Status: AC | PRN
  Administered 2018-07-05 (×2): 1 mg via INTRAVENOUS
  Filled 2018-07-05 (×2): qty 1

## 2018-07-05 MED ORDER — KETAMINE HCL 10 MG/ML IJ SOLN
INTRAMUSCULAR | Status: DC | PRN
  Administered 2018-07-05: 30 mg via INTRAVENOUS
  Administered 2018-07-05: 50 mg via INTRAVENOUS

## 2018-07-05 MED ORDER — ONDANSETRON HCL 4 MG/2ML IJ SOLN
INTRAMUSCULAR | Status: AC
  Filled 2018-07-05: qty 2

## 2018-07-05 MED ORDER — CHLORHEXIDINE GLUCONATE CLOTH 2 % EX PADS: 6.0000 | MEDICATED_PAD | Freq: Once | CUTANEOUS | Status: DC

## 2018-07-05 MED ORDER — FENTANYL CITRATE (PF) 250 MCG/5ML IJ SOLN
INTRAMUSCULAR | Status: AC
  Filled 2018-07-05: qty 5

## 2018-07-05 MED ORDER — THROMBIN 20000 UNITS EX SOLR
CUTANEOUS | Status: AC
  Filled 2018-07-05: qty 20000

## 2018-07-05 MED ORDER — SCOPOLAMINE 1 MG/3DAYS TD PT72
MEDICATED_PATCH | TRANSDERMAL | Status: AC
  Filled 2018-07-05: qty 1

## 2018-07-05 MED ORDER — PROMETHAZINE HCL 25 MG/ML IJ SOLN: 6.2500 mg | INTRAMUSCULAR | Status: DC | PRN

## 2018-07-05 MED ORDER — ONDANSETRON HCL 4 MG/2ML IJ SOLN: 4.0000 mg | Freq: Four times a day (QID) | INTRAMUSCULAR | Status: DC | PRN

## 2018-07-05 MED ORDER — ROCURONIUM BROMIDE 10 MG/ML (PF) SYRINGE
PREFILLED_SYRINGE | INTRAVENOUS | Status: DC | PRN
  Administered 2018-07-05 (×2): 50 mg via INTRAVENOUS

## 2018-07-05 MED ORDER — PANTOPRAZOLE SODIUM 40 MG PO TBEC
40.0000 mg | DELAYED_RELEASE_TABLET | Freq: Every day | ORAL | Status: DC
  Administered 2018-07-05 – 2018-07-06 (×2): 40 mg via ORAL
  Filled 2018-07-05 (×2): qty 1

## 2018-07-05 MED ORDER — OXYCODONE HCL 5 MG PO TABS
10.0000 mg | ORAL_TABLET | ORAL | Status: DC | PRN
  Administered 2018-07-05 – 2018-07-06 (×7): 10 mg via ORAL
  Filled 2018-07-05 (×6): qty 2

## 2018-07-05 MED ORDER — ONDANSETRON HCL 4 MG/2ML IJ SOLN
INTRAMUSCULAR | Status: DC | PRN
  Administered 2018-07-05: 4 mg via INTRAVENOUS

## 2018-07-05 MED ORDER — SODIUM CHLORIDE 0.9% FLUSH: 3.0000 mL | INTRAVENOUS | Status: DC | PRN

## 2018-07-05 MED ORDER — ESMOLOL HCL 100 MG/10ML IV SOLN
INTRAVENOUS | Status: AC
  Filled 2018-07-05: qty 10

## 2018-07-05 MED ORDER — GABAPENTIN 600 MG PO TABS
600.0000 mg | ORAL_TABLET | Freq: Three times a day (TID) | ORAL | Status: DC
  Administered 2018-07-05 – 2018-07-06 (×3): 600 mg via ORAL
  Filled 2018-07-05 (×3): qty 1

## 2018-07-05 MED ORDER — THROMBIN 5000 UNITS EX SOLR
CUTANEOUS | Status: AC
  Filled 2018-07-05: qty 5000

## 2018-07-05 MED ORDER — MIDAZOLAM HCL 2 MG/2ML IJ SOLN
INTRAMUSCULAR | Status: AC
  Filled 2018-07-05: qty 2

## 2018-07-05 MED ORDER — SODIUM CHLORIDE 0.9% FLUSH: 3.0000 mL | Freq: Two times a day (BID) | INTRAVENOUS | Status: DC

## 2018-07-05 MED ORDER — BUPIVACAINE HCL (PF) 0.25 % IJ SOLN
INTRAMUSCULAR | Status: AC
  Filled 2018-07-05: qty 30

## 2018-07-05 MED ORDER — ALBUTEROL SULFATE HFA 108 (90 BASE) MCG/ACT IN AERS
INHALATION_SPRAY | RESPIRATORY_TRACT | Status: DC | PRN
  Administered 2018-07-05: 2 via RESPIRATORY_TRACT

## 2018-07-05 MED ORDER — DIAZEPAM 5 MG PO TABS
5.0000 mg | ORAL_TABLET | Freq: Four times a day (QID) | ORAL | Status: DC | PRN
  Administered 2018-07-05: 10 mg via ORAL
  Administered 2018-07-06: 5 mg via ORAL
  Filled 2018-07-05: qty 1

## 2018-07-05 MED ORDER — SODIUM CHLORIDE 0.9 % IV SOLN
INTRAVENOUS | Status: DC | PRN
  Administered 2018-07-05: 25 ug/min via INTRAVENOUS

## 2018-07-05 MED ORDER — DIAZEPAM 5 MG PO TABS
ORAL_TABLET | ORAL | Status: AC
  Filled 2018-07-05: qty 2

## 2018-07-05 MED ORDER — CILOSTAZOL 50 MG PO TABS
50.0000 mg | ORAL_TABLET | Freq: Two times a day (BID) | ORAL | Status: DC
  Administered 2018-07-05 – 2018-07-06 (×3): 50 mg via ORAL
  Filled 2018-07-05 (×3): qty 1

## 2018-07-05 MED ORDER — BISACODYL 10 MG RE SUPP: 10.0000 mg | Freq: Every day | RECTAL | Status: DC | PRN

## 2018-07-05 MED ORDER — ACETAMINOPHEN 500 MG PO TABS
ORAL_TABLET | ORAL | Status: AC
  Filled 2018-07-05: qty 2

## 2018-07-05 MED ORDER — SUGAMMADEX SODIUM 200 MG/2ML IV SOLN
INTRAVENOUS | Status: DC | PRN
  Administered 2018-07-05: 154.2 mg via INTRAVENOUS

## 2018-07-05 MED ORDER — LACTATED RINGERS IV SOLN
INTRAVENOUS | Status: DC
  Administered 2018-07-05: 09:00:00 via INTRAVENOUS

## 2018-07-05 MED ORDER — PHENYLEPHRINE HCL 10 MG/ML IJ SOLN
INTRAMUSCULAR | Status: AC
  Filled 2018-07-05: qty 1

## 2018-07-05 MED ORDER — ACETAMINOPHEN 325 MG PO TABS
650.0000 mg | ORAL_TABLET | ORAL | Status: DC | PRN
  Administered 2018-07-05 – 2018-07-06 (×2): 650 mg via ORAL
  Filled 2018-07-05 (×2): qty 2

## 2018-07-05 MED ORDER — SCOPOLAMINE 1 MG/3DAYS TD PT72
1.0000 | MEDICATED_PATCH | TRANSDERMAL | Status: DC
  Administered 2018-07-05: 1.5 mg via TRANSDERMAL

## 2018-07-05 MED ORDER — POLYETHYLENE GLYCOL 3350 17 G PO PACK: 17.0000 g | PACK | Freq: Every day | ORAL | Status: DC | PRN

## 2018-07-05 SURGICAL SUPPLY — 61 items
BAG DECANTER FOR FLEXI CONT (MISCELLANEOUS) ×2 IMPLANT
BENZOIN TINCTURE PRP APPL 2/3 (GAUZE/BANDAGES/DRESSINGS) ×2 IMPLANT
BIT DRILL 2.4 (BIT) ×2 IMPLANT
BLADE CLIPPER SURG (BLADE) ×2 IMPLANT
BUR MATCHSTICK NEURO 3.0 LAGG (BURR) ×2 IMPLANT
CANISTER SUCT 3000ML PPV (MISCELLANEOUS) ×2 IMPLANT
CARTRIDGE OIL MAESTRO DRILL (MISCELLANEOUS) ×1 IMPLANT
COVER WAND RF STERILE (DRAPES) ×2 IMPLANT
DERMABOND ADVANCED (GAUZE/BANDAGES/DRESSINGS) ×1
DERMABOND ADVANCED .7 DNX12 (GAUZE/BANDAGES/DRESSINGS) ×1 IMPLANT
DIFFUSER DRILL AIR PNEUMATIC (MISCELLANEOUS) ×2 IMPLANT
DRAPE C-ARM 42X72 X-RAY (DRAPES) ×4 IMPLANT
DRAPE LAPAROTOMY 100X72 PEDS (DRAPES) ×2 IMPLANT
DRSG OPSITE POSTOP 4X6 (GAUZE/BANDAGES/DRESSINGS) ×2 IMPLANT
DURAPREP 26ML APPLICATOR (WOUND CARE) ×2 IMPLANT
ELECT REM PT RETURN 9FT ADLT (ELECTROSURGICAL) ×2
ELECTRODE REM PT RTRN 9FT ADLT (ELECTROSURGICAL) ×1 IMPLANT
EVACUATOR 1/8 PVC DRAIN (DRAIN) IMPLANT
GAUZE 4X4 16PLY RFD (DISPOSABLE) ×2 IMPLANT
GAUZE SPONGE 4X4 12PLY STRL (GAUZE/BANDAGES/DRESSINGS) IMPLANT
GLOVE BIO SURGEON STRL SZ 6.5 (GLOVE) ×2 IMPLANT
GLOVE BIOGEL PI IND STRL 6.5 (GLOVE) ×2 IMPLANT
GLOVE BIOGEL PI IND STRL 7.5 (GLOVE) ×1 IMPLANT
GLOVE BIOGEL PI INDICATOR 6.5 (GLOVE) ×2
GLOVE BIOGEL PI INDICATOR 7.5 (GLOVE) ×1
GLOVE ECLIPSE 9.0 STRL (GLOVE) ×2 IMPLANT
GLOVE EXAM NITRILE XL STR (GLOVE) IMPLANT
GLOVE SURG SS PI 6.0 STRL IVOR (GLOVE) ×6 IMPLANT
GOWN STRL REUS W/ TWL LRG LVL3 (GOWN DISPOSABLE) ×2 IMPLANT
GOWN STRL REUS W/ TWL XL LVL3 (GOWN DISPOSABLE) ×1 IMPLANT
GOWN STRL REUS W/TWL 2XL LVL3 (GOWN DISPOSABLE) IMPLANT
GOWN STRL REUS W/TWL LRG LVL3 (GOWN DISPOSABLE) ×2
GOWN STRL REUS W/TWL XL LVL3 (GOWN DISPOSABLE) ×1
GRAFT BN 5X1XSPNE CVD POST DBM (Bone Implant) ×1 IMPLANT
GRAFT BONE MAGNIFUSE 1X5CM (Bone Implant) ×1 IMPLANT
KIT BASIN OR (CUSTOM PROCEDURE TRAY) ×2 IMPLANT
KIT INFUSE X SMALL 1.4CC (Orthopedic Implant) ×2 IMPLANT
KIT TURNOVER KIT B (KITS) ×2 IMPLANT
NEEDLE HYPO 22GX1.5 SAFETY (NEEDLE) ×2 IMPLANT
NEEDLE SPNL 22GX3.5 QUINCKE BK (NEEDLE) ×2 IMPLANT
NS IRRIG 1000ML POUR BTL (IV SOLUTION) ×2 IMPLANT
OIL CARTRIDGE MAESTRO DRILL (MISCELLANEOUS) ×2
PACK LAMINECTOMY NEURO (CUSTOM PROCEDURE TRAY) ×2 IMPLANT
PAD ARMBOARD 7.5X6 YLW CONV (MISCELLANEOUS) ×6 IMPLANT
PIN MAYFIELD SKULL DISP (PIN) ×2 IMPLANT
ROD PRE CUT 3.5X40MM SPINAL (Rod) ×4 IMPLANT
SCREW MULTI AXIAL 3.5X14MM (Screw) ×12 IMPLANT
SCREW SET 3600315 STANDARD (Screw) ×12 IMPLANT
SCREW SET 3600315 STD (Screw) ×6 IMPLANT
SPONGE LAP 4X18 RFD (DISPOSABLE) ×4 IMPLANT
SPONGE SURGIFOAM ABS GEL 100 (HEMOSTASIS) ×2 IMPLANT
SPONGE SURGIFOAM ABS GEL SZ50 (HEMOSTASIS) IMPLANT
STRIP CLOSURE SKIN 1/2X4 (GAUZE/BANDAGES/DRESSINGS) ×2 IMPLANT
SUT VIC AB 0 CT1 18XCR BRD8 (SUTURE) ×1 IMPLANT
SUT VIC AB 0 CT1 8-18 (SUTURE) ×1
SUT VIC AB 2-0 CT1 18 (SUTURE) ×2 IMPLANT
SUT VIC AB 3-0 SH 8-18 (SUTURE) ×2 IMPLANT
TAPE CLOTH 4X10 WHT NS (GAUZE/BANDAGES/DRESSINGS) ×2 IMPLANT
TOWEL GREEN STERILE (TOWEL DISPOSABLE) ×2 IMPLANT
TOWEL GREEN STERILE FF (TOWEL DISPOSABLE) ×2 IMPLANT
WATER STERILE IRR 1000ML POUR (IV SOLUTION) ×2 IMPLANT

## 2018-07-05 NOTE — H&P (Signed)
Norma Ignasiak is an 53 y.o. male.   Chief Complaint: Neck pain HPI: 53 year old male status post prior C5-6, C6-7 anterior cervical discectomy with attempted fusion by Dr. Luiz Ochoa a number of years ago presents with persistent neck pain failing conservative management.  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  Patient presents now for posterior cervical fusion with instrumentation in hopes of improving his symptoms.  He has some occasional radiating neck pain and numbness.  He has no major symptoms of weakness.  His primary complaint is that of pain in his neck.  Past Medical History:  Diagnosis Date  . Anticoagulated on Coumadin   . Antiphospholipid antibody positive 06/27/2016   followed by dr kale ([facility])  . Anxiety with depression 06/24/2014  . Chronic back pain   . Dysrhythmia    PVCs  . ED (erectile dysfunction)   . GERD (gastroesophageal reflux disease)   . Hemorrhoids, internal, with bleeding   . Hiatal hernia   . History of adenomatous polyp of colon    07-08-2016  tubular adenoma's  . History of atrial fibrillation    episode 04/ 2018  . History of DVT of lower extremity   . Hyperlipidemia   . Iron deficiency anemia due to chronic blood loss followed by dr Irene Limbo ([facility])   from hemorrhoids and on coumadin-- hx Iron infusion's and blood transfusion's x2 07-09-2016  . Neuropathy, peripheral    right foot numbness from lumbar pinched nerve  . OA (osteoarthritis)   . PAD (peripheral artery disease) ([place]) followed by dr Bridgett Larsson--  per LOV note ABI 11-14-2016  widely patent stent/  11-27-2016 per pt no claudication symptoms   hx right CIA angioplasty and stent 02-24-2016/  right CIA thromectomy and patch angioplasty for restenosis 07-18-2015  . Primary hypercoagulable state ([place]) [D68.59] 09/15/2016  . Recovering alcoholic in remission ([place])    per pt since 04-25-2016  . S/P insertion of iliac artery stent    02-23-2017   right CIA PTA and stent/  05-11-2016  in-stent restenosis  s/p  thrombectomy/  07-17-2016 thrombectomy and  patch angioplasty right femoral artery   . Smokers' cough ([place])   . Wears glasses     Past Surgical History:  Procedure Laterality Date  . ANGIOPLASTY ILLIAC ARTERY Right 07/17/2016   Procedure: ANGIOPLASTY RIGHT COMMON ILIAC ARTERY;  Surgeon: Conrad Energy, MD;  Location: [place];  Service: Vascular;  Laterality: Right;  . ANTERIOR CERVICAL DECOMP/DISCECTOMY FUSION  07/21/2010   C5 -- C7  . CARDIOVASCULAR STRESS TEST  06-14-2016   dr hilty   normal perfusion study w/ no reversible ischemia/  stress ef 44% but visually looks better , echo ordered (LVEF 30-44%)  , normal LV wall motion   . COLONOSCOPY N/A 07/08/2016   Procedure: COLONOSCOPY;  Surgeon: Clarene Essex, MD;  Location: [facility];  Service: Endoscopy;  Laterality: N/A;  . ESOPHAGOGASTRODUODENOSCOPY N/A 07/08/2016   Procedure: ESOPHAGOGASTRODUODENOSCOPY (EGD);  Surgeon: Clarene Essex, MD;  Location: [facility];  Service: Endoscopy;  Laterality: N/A;  . GROIN DEBRIDEMENT Right 07/25/2016   Procedure: EVACUATION HEMATOMA RIGHT GROIN;  Surgeon: Waynetta Sandy, MD;  Location: [place];  Service: Vascular;  Laterality: Right;  . HEMORRHOID SURGERY  2013  approx.   ?lanced/ banding  . HEMORRHOID SURGERY N/A 12/07/2016   Procedure: 3 COLUMN HEMORRHOIDECTOMY;  Surgeon: Leighton Ruff, MD;  Location[facility];  Service: General;  Laterality: N/A;  . INTRAOPERATIVE ARTERIOGRAM Right 07/17/2016  Procedure: INTRA OPERATIVE ANGIOGRAM OF RIGHT COMMON ILIAC ARTERY;  Surgeon: Conrad Bow Mar, MD;  Location: [place];  Service: Vascular;  Laterality: Right;  . KNEE SURGERY Right 1983   cartilage  . PATCH ANGIOPLASTY Right 07/17/2016   Procedure: PATCH ANGIOPLASTY RIGHT FEMORAL ARTERY USING Rueben Bash BIOLOGIC PATCH;  Surgeon: Conrad Crest Hill, MD;  Location: [place];  Service: Vascular;  Laterality: Right;  . PERIPHERAL VASCULAR  CATHETERIZATION N/A 02/24/2016   Procedure: Abdominal Aortogram w/Lower Extremity;  Surgeon: Conrad Lakeview, MD;  Location: [place] [facility];  Service: Cardiovascular;  Laterality: N/A;  . PERIPHERAL VASCULAR CATHETERIZATION Right 02/24/2016   Procedure: Peripheral Vascular Intervention;  Surgeon: Conrad Hood, MD;  Location: [place] [facility];  Service: Cardiovascular;  Laterality: Right;  Common iliac  . PERIPHERAL VASCULAR CATHETERIZATION N/A 05/11/2016   Procedure: Abdominal Aortogram;  Surgeon: Conrad Reserve, MD;  Location: [place] [facility];  Service: Cardiovascular;  Laterality: N/A;  . PERIPHERAL VASCULAR CATHETERIZATION N/A 05/11/2016   Procedure: Lower Extremity Angiography;  Surgeon: Conrad [place], MD;  Location: [place] [facility];  Service: Cardiovascular;  Laterality: N/A;  . THROMBECTOMY FEMORAL ARTERY Right 07/17/2016   Procedure: THROMBECTOMY RIGHT FEMORAL ARTERY;  Surgeon: Conrad Lucas, MD;  Location: [place];  Service: Vascular;  Laterality: Right;  . TRANSTHORACIC ECHOCARDIOGRAM  06-20-2016   dr hilty   ef 55-60%,  grade 1 diastolic dysfunction/  mild TR    Family History  Problem Relation Age of Onset  . Cancer Father        Lung   Social History:  reports that he has been smoking cigarettes and e-cigarettes. He has a 16.50 pack-year smoking history. He has never used smokeless tobacco. He reports that he does not drink alcohol or use drugs.  Allergies:  Allergies  Allergen Reactions  . Cymbalta [Duloxetine Hcl] Other (See Comments)    Headaches    Medications Prior to Admission  Medication Sig Dispense Refill  . carvedilol (COREG) 3.125 MG tablet Take 1 tablet (3.125 mg total) by mouth 2 (two) times daily. 180 tablet 3  . cilostazol (PLETAL) 50 MG tablet Take 1 tablet (50 mg total) by mouth 2 (two) times daily. 180 tablet 0  . enoxaparin (LOVENOX) 120 MG/0.8ML injection Inject 0.8 mLs (120 mg total) into the skin daily. (Patient taking differently: Inject 120 mg into  the skin daily. To start 07/02/2017) 7 Syringe 0  . esomeprazole (NEXIUM) 20 MG capsule Take 20 mg by mouth daily at 12 noon.    . gabapentin (NEURONTIN) 600 MG tablet Take 1 tablet (600 mg total) by mouth 3 (three) times daily. 270 tablet 3  . [START ON 07/09/2018] oxyCODONE-acetaminophen (PERCOCET) 10-325 MG tablet Take 1 tablet by mouth every 8 (eight) hours as needed for pain. 90 tablet 0  . oxyCODONE-acetaminophen (PERCOCET) 10-325 MG tablet Take 1 tablet by mouth every 8 (eight) hours as needed for pain. 90 tablet 0  . rosuvastatin (CRESTOR) 5 MG tablet Take 1 tablet (5 mg total) by mouth daily. 90 tablet 2  . varenicline (CHANTIX CONTINUING MONTH PAK) 1 MG tablet Take 1 tablet (1 mg total) by mouth 2 (two) times daily. 270 tablet 0  . warfarin (COUMADIN) 1 MG tablet Take 1 tablet (1 mg total) by mouth daily. (Patient taking differently: Take 7 mg by mouth every Tuesday. ) 30 tablet 1  . warfarin (COUMADIN) 6 MG tablet Take 1 tablet by mouth daily or as directed by anticoagulation clinic (  Patient taking differently: Take 6 mg by mouth daily. ) 105 tablet 1  . sildenafil (VIAGRA) 25 MG tablet 2-3 tabs po x 1. Take 30 minutes to 4 hours prior to sexual activity. 21 tablet 2  . varenicline (CHANTIX STARTING MONTH PAK) 0.5 MG X 11 & 1 MG X 42 tablet Take one 0.5 mg tablet by mouth once daily for 3 days, then increase to one 0.5 mg tablet twice daily for 4 days, then increase to one 1 mg tablet twice daily. (Patient not taking: Reported on 06/21/2018) 53 tablet 0    Results for orders placed or performed during the hospital encounter of 07/05/18 (from the past 48 hour(s))  CBC     Status: Abnormal   Collection Time: 07/05/18  8:21 AM  Result Value Ref Range   WBC 15.0 (H) 4.0 - 10.5 K/uL   RBC 4.91 4.22 - 5.81 MIL/uL   Hemoglobin 16.7 13.0 - 17.0 g/dL   HCT 47.8 39.0 - 52.0 %   MCV 97.4 80.0 - 100.0 fL   MCH 34.0 26.0 - 34.0 pg   MCHC 34.9 30.0 - 36.0 g/dL   RDW 12.7 11.5 - 15.5 %   Platelets  150 150 - 400 K/uL   nRBC 0.0 0.0 - 0.2 %    Comment: Performed at [facility], [place] [address] [place], [place] [address]  Protime-INR     Status: None   Collection Time: 07/05/18  8:21 AM  Result Value Ref Range   Prothrombin Time 14.2 11.4 - 15.2 seconds   INR 1.11     Comment: Performed at [facility] [address], [place], [place] [address]   No results found.  Pertinent items noted in HPI and remainder of comprehensive ROS otherwise negative.  Blood pressure (!) 142/61, pulse 60, temperature (!) 97.4 F (36.3 C), temperature source Oral, resp. rate 18, height 5\' 9"  (1.753 m), weight 77.1 kg, SpO2 100 %.  Patient is awake and alert.  He is oriented and appropriate.  Speech is fluent.  Judgment insight are intact.  Cranial nerve function normal bilateral.  Motor examination extremities with intact motor strength bilateral.  Sensory examination nonfocal.  Deep tendon reflex is hypoactive but symmetric.  No evidence of long track signs.  Examination of the head ears eyes nose throat is unremarked.  Chest and abdomen are benign.  Extremities are free from injury or deformity. 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  Plan C5-6-7 posterior cervical fusion with lateral mass instrumentation and bone morphogenic protein soaked sponges with morselized allograft.  Risks and benefits been explained.  Patient wishes to proceed.  Mallie Mussel A Willis Kuipers 07/05/2018, 9:46 AM

## 2018-07-05 NOTE — Evaluation (Signed)
Physical Therapy Evaluation and Discharge Patient Details Name: [name] MRN: [id number] DOB: 03-02-1966 Today's Date: 07/05/2018   History of Present Illness  53 year old male status post prior C5-6, C6-7 anterior cervical discectomy . Underwent C5-6-7 posterior cervical fusion with morselized allograft.   Clinical Impression  Patient evaluated by Physical Therapy with no further acute PT needs identified. Pt denies numbness/radicular pain with 5/5 strength in upper extremities. Ambulating hallway distances independently and able to negotiate steps in preparation for discharge home. Educated patient on cervical precautions and car transfer technique; pt verbalized understanding. All education has been completed and the patient has no further questions. No follow-up Physical Therapy or equipment needs. PT is signing off. Thank you for this referral.     Follow Up Recommendations No PT follow up    Equipment Recommendations  3in1 (PT)    Recommendations for Other Services       Precautions / Restrictions Precautions Precautions: Cervical Precaution Booklet Issued: Yes (comment) Required Braces or Orthoses: Cervical Brace Cervical Brace: Hard collar Restrictions Weight Bearing Restrictions: No      Mobility  Bed Mobility Overal bed mobility: Modified Independent Bed Mobility: Rolling;Sidelying to Sit Rolling: Supervision Sidelying to sit: Supervision       General bed mobility comments: vc for proper technique  Transfers Overall transfer level: Modified independent Equipment used: None                Ambulation/Gait Ambulation/Gait assistance: Independent Gait Distance (Feet): 500 Feet Assistive device: None Gait Pattern/deviations: WFL(Within Functional Limits)        Stairs Stairs: Yes Stairs assistance: Modified independent (Device/Increase time) Stair Management: One rail Left Number of Stairs: 3 General stair comments: cues for technique and  step by step pattern  Wheelchair Mobility    Modified Rankin (Stroke Patients Only)       Balance Overall balance assessment: Modified Independent                                           Pertinent Vitals/Pain Pain Assessment: 0-10 Pain Score: 6  Pain Location: neck Pain Descriptors / Indicators: Operative site guarding Pain Intervention(s): Monitored during session    Home Living Family/patient expects to be discharged to:: Private residence Living Arrangements: Spouse/significant other Available Help at Discharge: Available PRN/intermittently Type of Home: House Home Access: Stairs to enter Entrance Stairs-Rails: Psychiatric nurse of Steps: 5 Home Layout: One level Home Equipment: None      Prior Function Level of Independence: Independent         Comments: enjoys bass fishing     Hand Dominance   Dominant Hand: Right    Extremity/Trunk Assessment   Upper Extremity Assessment Upper Extremity Assessment: RUE deficits/detail;LUE deficits/detail RUE Deficits / Details: Strength 5/5 LUE Deficits / Details: Strength 5/5    Lower Extremity Assessment Lower Extremity Assessment: Overall WFL for tasks assessed    Cervical / Trunk Assessment Cervical / Trunk Assessment: Other exceptions(s/p cervical fusion)  Communication   Communication: No difficulties  Cognition Arousal/Alertness: Awake/alert Behavior During Therapy: WFL for tasks assessed/performed Overall Cognitive Status: Within Functional Limits for tasks assessed                                        General Comments  Exercises     Assessment/Plan    PT Assessment Patent does not need any further PT services  PT Problem List         PT Treatment Interventions      PT Goals (Current goals can be found in the Care Plan section)  Acute Rehab PT Goals Patient Stated Goal: to go home PT Goal Formulation: All assessment and  education complete, DC therapy    Frequency     Barriers to discharge        Co-evaluation               AM-PAC PT "6 Clicks" Mobility  Outcome Measure Help needed turning from your back to your side while in a flat bed without using bedrails?: None Help needed moving from lying on your back to sitting on the side of a flat bed without using bedrails?: None Help needed moving to and from a bed to a chair (including a wheelchair)?: None Help needed standing up from a chair using your arms (e.g., wheelchair or bedside chair)?: None Help needed to walk in hospital room?: None Help needed climbing 3-5 steps with a railing? : None 6 Click Score: 24    End of Session Equipment Utilized During Treatment: Cervical collar Activity Tolerance: Patient tolerated treatment well Patient left: in bed;with call bell/phone within reach;with family/visitor present Nurse Communication: Mobility status PT Visit Diagnosis: Pain Pain - part of body: (cervical)    Time: 1655-3748 PT Time Calculation (min) (ACUTE ONLY): 20 min   Charges:   PT Evaluation $PT Eval Low Complexity: 1 Low         Ellamae Sia, PT, DPT Acute Rehabilitation Services Pager [phone] Office [phone]  Willy Eddy 07/05/2018, 5:09 PM

## 2018-07-05 NOTE — Anesthesia Postprocedure Evaluation (Signed)
Anesthesia Post Note  Patient: [name]  Procedure(s) Performed: Posterior cervical fusion with lateral mass fixation - Cervical five-Cervical six, Cervical six-Cervical seven (N/A Spine Cervical)     Patient location during evaluation: PACU Anesthesia Type: General Level of consciousness: sedated Pain management: pain level controlled Vital Signs Assessment: post-procedure vital signs reviewed and stable Respiratory status: spontaneous breathing and respiratory function stable Cardiovascular status: stable Postop Assessment: no apparent nausea or vomiting Anesthetic complications: no    Last Vitals:  Vitals:   07/05/18 1246 07/05/18 1301  BP: (!) 157/99 (!) 157/93  Pulse: 79 63  Resp: 10 17  Temp:    SpO2: 100% 99%                  Quade Ramirez DANIEL

## 2018-07-05 NOTE — Progress Notes (Signed)
OT Evaluation  PTA, pt independent with ADL and mobility. Pt states he is nervous about "messing this up" and wants to be very "cautious". Pt would like a 3in1 to use as a shower chair. Completed education regarding compensatory strategies for ADL and functional mobility for ADL using written handout. Wife present for session. Educated on use of Asp[place] collar  - OK to remove for bathing/dressing; needs ot sleep in collar until his return visit to Dr. Annette Stable per discussion with MD. OT signing off.    Recommendations: No OT follow up; 3in1 to use as shower chair   07/05/18 1600  OT Visit Information  Last OT Received On 07/05/18  Assistance Needed +1  History of Present Illness 53 year old male status post prior C5-6, C6-7 anterior cervical discectomy . Underwent C5-6-7 posterior cervical fusion with morselized allograft. See chart for PMH.   Precautions  Precautions Cervical  Precaution Booklet Issued Yes (comment)  Required Braces or Orthoses Cervical Brace  Cervical Brace Hard collar (OK to remove for ADL; sleep in collar until return visit)  Home Living  Family/patient expects to be discharged to: Private residence  Living Arrangements Spouse/significant other  Available Help at Discharge Available PRN/intermittently  Type of [place] to enter  Entrance Stairs-Number of Steps 5  Entrance Stairs-Rails [place] One level  Bathroom Shower/Tub Tub/shower unit;Walk-in Cytogeneticist Yes  How Accessible Accessible via walker  Home Equipment None  Prior Function  Level of Independence Independent  Communication  Communication No difficulties  Pain Assessment  Pain Assessment 0-10  Pain Score 6  Pain Location neck  Cognition  Arousal/Alertness Awake/alert  Behavior During Therapy WFL for tasks assessed/performed  Overall Cognitive Status Within Functional Limits for tasks assessed  Upper Extremity  Assessment  Upper Extremity Assessment Overall WFL for tasks assessed  Lower Extremity Assessment  Lower Extremity Assessment Defer to PT evaluation  Cervical / Trunk Assessment  Cervical / Trunk Assessment Other exceptions (s/p cervical fusion)  ADL  Overall ADL's  Needs assistance/impaired  Functional mobility during ADLs Modified independent  General ADL Comments Educated on cervical precautions and ADL using compensatory strateiges. recomend Clinical biochemist to help wtih IDAL tasks given that wife is returning to work; educated on home safety adn reducing risk of falls; Pt would like 3in1 to use as shower chair  Bed Mobility  Overal bed mobility Needs Assistance  Bed Mobility Rolling;Sidelying to Sit  Rolling Supervision  Sidelying to sit Supervision  General bed mobility comments vc for proper technique  Transfers  Overall transfer level Modified independent  Balance  Overall balance assessment Modified Independent  OT - End of Session  Equipment Utilized During Treatment Cervical collar  Activity Tolerance Patient tolerated treatment well  Patient left in bed;with call bell/phone within reach;with family/visitor present  Nurse Communication Mobility status  OT Assessment  OT Recommendation/Assessment Patient does not need any further OT services  OT Visit Diagnosis Pain  Pain - part of body  (neck)  OT Problem List Pain;Decreased knowledge of use of DME or AE;Decreased knowledge of precautions  AM-PAC OT "6 Clicks" Daily Activity Outcome Measure (Version 2)  Help from another person eating meals? 4  Help from another person taking care of personal grooming? 3  Help from another person toileting, which includes using toliet, bedpan, or urinal? 4  Help from another person bathing (including washing, rinsing, drying)? 3  Help from another person to put on and  taking off regular upper body clothing? 3  Help from another person to put on and taking off regular lower body  clothing? 4  6 Click Score 21  OT Recommendation  Follow Up Recommendations No OT follow up;Supervision - Intermittent  OT Equipment 3 in 1 bedside commode;Other (comment) (to use as shower seat)  Acute Rehab OT Goals  Patient Stated Goal to go home  OT Goal Formulation With patient  OT Time Calculation  OT Start Time (ACUTE ONLY) 1619  OT Stop Time (ACUTE ONLY) 1640  OT Time Calculation (min) 21 min  OT General Charges  $OT Visit 1 Visit  OT Evaluation  $OT Eval Low Complexity 1 Low  Written Expression  Dominant Hand Right  Maurie Boettcher, OT/L   Acute OT Clinical Specialist [place] Pager [phone] Office [phone]

## 2018-07-05 NOTE — Op Note (Signed)
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  Asst. Surgeon: Reinaldo Meeker, NP  Anesthesia: General  Indication: Patient is a 53 year old male with neck pain.  He is status post prior C5-6 and C6-7 anterior cervical discectomy and fusion by another physician.  This has an obvious pseudoarthrosis at both levels.  The patient is failed conservative management.  He presents now for posterior cervical fusion in hopes of improving his symptoms.  Operative note: After induction of anesthesia, patient position prone onto bolsters with his head fixed in a neutral head position with the [place] pin headrest.  Patient's posterior cervical region prepped and draped sterilely.  Incision made overlying C5-C7.  Dissection performed bilaterally.  Retractor placed.  Fluoroscopy used.  Levels confirmed.  Lateral mass of the articular process of C5-C6 and C7 were identified.  Mid position of the articular mass was drilled using high-speed drill.  Pilot hole was then made using a small drill with a cephalad and lateral trajectory at C5-C6 and C7.  Pilot holes were probed and found to be solid within the bone.  Pilot holes were tapped.  14 mm Medtronic vertex screws were placed bilaterally at C5, C6 and C7.  The screw at C7 on the left had poor purchase and was repositioned.  This took a more medial course after the repositioning and is likely more of a pedicle screw than true lateral mass screw.  Short segment titanium rod was then contoured and placed over the screw heads at C5-6 and 7.  Locking caps placed over the screw.  Locking caps then engaged with a construct under mild compression.  Final images obtained  reveal good position of the hardware at the proper upper level with normal alignment of spine.  Posterior elements were decorticated.  Morselized autograft was saved and were used.  Bone returning protein soaked sponges were placed laterally over the decorticated facet joints.  Morselized allograft was then packed posterior laterally.  Wound is then closed in layers of Vicryl sutures.  Steri-Strips sterile dressing were applied.  No apparent complications.  Patient tolerated procedure well and he returns to the recovery room postop.

## 2018-07-05 NOTE — Brief Op Note (Signed)
07/05/2018  12:04 PM  PATIENT:  [name]  53 y.o. male  PRE-OPERATIVE DIAGNOSIS:  Pseudoarthrosis  POST-OPERATIVE DIAGNOSIS:  Pseudoarthrosis  PROCEDURE:  Procedure(s): Posterior cervical fusion with lateral mass fixation - Cervical five-Cervical six, Cervical six-Cervical seven (N/A)  SURGEON:  Surgeon(s) and Role:    Earnie Larsson, MD - Primary  PHYSICIAN ASSISTANT:   ASSISTANTSReinaldo Meeker, NP   ANESTHESIA:   general  EBL:  25 mL   BLOOD ADMINISTERED:none  DRAINS: none   LOCAL MEDICATIONS USED:  MARCAINE     SPECIMEN:  No Specimen  DISPOSITION OF SPECIMEN:  N/A  COUNTS:  YES  TOURNIQUET:  * No tourniquets in log *  DICTATION: .Dragon Dictation  PLAN OF CARE: Admit to inpatient   PATIENT DISPOSITION:  PACU - hemodynamically stable.   Delay start of Pharmacological VTE agent (>24hrs) due to surgical blood loss or risk of bleeding: yes

## 2018-07-05 NOTE — Anesthesia Procedure Notes (Signed)
Procedure Name: Intubation Date/Time: 07/05/2018 10:37 AM Performed by: Kathryne Hitch, CRNA Pre-anesthesia Checklist: Patient identified, Emergency Drugs available, Suction available, Patient being monitored and Timeout performed Patient Re-evaluated:Patient Re-evaluated prior to induction Oxygen Delivery Method: Circle system utilized Preoxygenation: Pre-oxygenation with 100% oxygen Induction Type: IV induction Ventilation: Mask ventilation without difficulty Laryngoscope Size: Mac and 4 Grade View: Grade I Tube type: Oral Tube size: 7.5 mm Number of attempts: 2 (EMT student x1) Airway Equipment and Method: Stylet and Oral airway Placement Confirmation: ETT inserted through vocal cords under direct vision,  positive ETCO2 and breath sounds checked- equal and bilateral Secured at: 23 cm Tube secured with: Tape Dental Injury: Teeth and Oropharynx as per pre-operative assessment

## 2018-07-05 NOTE — Progress Notes (Signed)
Postop check.  Overall doing very well.  Neck pain moderate and expected.  No radicular pain.  No numbness paresthesias or weakness.  Patient happy with his progress.  Probable discharge tomorrow.  Plan to resume anticoagulation per his standing bridging regimen on Sunday.

## 2018-07-05 NOTE — Transfer of Care (Deleted)
Immediate Anesthesia Transfer of Care Note  Patient: [name]  Procedure(s) Performed: Posterior cervical fusion with lateral mass fixation - C5-C6 - C6-C7 (N/A )  Patient Location: PACU  Anesthesia Type:General  Level of Consciousness: drowsy and patient cooperative  Airway & Oxygen Therapy: Patient Spontanous Breathing and Patient connected to nasal cannula oxygen  Post-op Assessment: Report given to RN, Post -op Vital signs reviewed and stable and Patient moving all extremities X 4  Post vital signs: Reviewed and stable  Last Vitals:  Vitals Value Taken Time  BP    Temp    Pulse    Resp    SpO2      Last Pain:  Vitals:   07/05/18 0845  TempSrc:   PainSc: 4          Complications: No apparent anesthesia complications

## 2018-07-05 NOTE — Transfer of Care (Signed)
Immediate Anesthesia Transfer of Care Note  Patient: [name]  Procedure(s) Performed: Posterior cervical fusion with lateral mass fixation - Cervical five-Cervical six, Cervical six-Cervical seven (N/A Spine Cervical)  Patient Location: PACU  Anesthesia Type:General  Level of Consciousness: awake and patient cooperative  Airway & Oxygen Therapy: Patient Spontanous Breathing and Patient connected to face mask oxygen  Post-op Assessment: Report given to RN, Post -op Vital signs reviewed and stable and Patient moving all extremities X 4  Post vital signs: Reviewed and stable  Last Vitals:  Vitals Value Taken Time  BP    Temp    Pulse 82 07/05/2018 12:12 PM  Resp 19 07/05/2018 12:12 PM  SpO2 100 % 07/05/2018 12:12 PM  Vitals shown include unvalidated device data.  Last Pain:  Vitals:   07/05/18 0845  TempSrc:   PainSc: 4          Complications: No apparent anesthesia complications

## 2018-07-06 MED ORDER — DIAZEPAM 5 MG PO TABS: 5.0000 mg | ORAL_TABLET | Freq: Four times a day (QID) | ORAL | 0 refills | Status: DC | PRN

## 2018-07-06 MED ORDER — OXYCODONE HCL 10 MG PO TABS: 10.0000 mg | ORAL_TABLET | ORAL | 0 refills | Status: DC | PRN

## 2018-07-06 MED ORDER — WARFARIN SODIUM 1 MG PO TABS: 7.0000 mg | ORAL_TABLET | ORAL | Status: DC

## 2018-07-06 MED ORDER — WARFARIN SODIUM 6 MG PO TABS: 6.0000 mg | ORAL_TABLET | Freq: Every day | ORAL | Status: DC

## 2018-07-06 MED ORDER — ENOXAPARIN SODIUM 120 MG/0.8ML ~~LOC~~ SOLN: 120.0000 mg | SUBCUTANEOUS | 0 refills | Status: DC

## 2018-07-06 NOTE — Progress Notes (Signed)
Patient is discharged from room 3C03 at this time. Alert and in stable condition. IV site d/c'[d and instructions read to patient and spouse with understanding verbalized. Left unit via wheelchair with all belongings at side. 

## 2018-07-06 NOTE — Discharge Instructions (Signed)
Resume anticoagulation per his standing bridging regimen on Sunday.  Wound Care Keep incision area dry.  You may remove outer bandage after 2 days and shower.   If you shower prior cover incision with plastic wrap.  Do not put any creams, lotions, or ointments on incision. Leave steri-strips on neck.  They will fall off by themselves. Activity Walk each and every day, increasing distance each day. No lifting greater than 5 lbs.  Avoid excessive neck motion. No driving for 2 weeks; may ride as a passenger locally. Wear neck brace at all times except when showering. Diet Resume your normal diet.  Return to Work Will be discussed at you follow up appointment. Call Your Doctor If Any of These Occur Redness, drainage, or swelling at the wound.  Temperature greater than 101 degrees. Severe pain not relieved by pain medication. Increased difficulty swallowing.  Incision starts to come apart. Follow Up Appt Call today and ask for Wells Guiles for appointment in 1-2 weeks [phone]) or for problems.  If you have any hardware placed in your spine, you will need an x-ray before your appointment.

## 2018-07-06 NOTE — Discharge Summary (Signed)
Discharge Summary  Date of Admission: 07/05/2018  Date of Discharge: 07/06/18  Attending Physician: Earnie Larsson, MD  Hospital Course: Patient was admitted following an uncomplicated G8-Y2 posterior cervical fusion for pseudoarthrosis. He was recovered in PACU and transferred to the spine recovery center. His hospital course was uncomplicated and the patient was discharged home on 07/06/2018. He will follow up in clinic with Dr. Annette Stable in 2 weeks.  Neurologic exam at discharge:  AOx3, PERRL, EOMI, FS, TM Strength 5/5 x4, SILTx4  Discharge diagnosis: Cervical pseudoarthrosis  Judith Part, MD 07/06/18 9:34 AM

## 2018-07-06 NOTE — Progress Notes (Signed)
Neurosurgery Service Progress Note  Subjective: No acute events overnight, some neck soreness but arms feel better than preop   Objective: Vitals:   07/05/18 2028 07/05/18 2315 07/06/18 0609 07/06/18 0727  BP: 118/82 117/61 112/76 (!) 108/59  Pulse: 62 84 76 64  Resp: 18 18  16   Temp: 98 F (36.7 C) (!) 97.4 F (36.3 C) (!) 97.5 F (36.4 C) (!) 97.5 F (36.4 C)  TempSrc: Oral Oral Oral Oral  SpO2: 97% 98% 98% 97%  Weight:      Height:       Temp (24hrs), Avg:97.6 F (36.4 C), Min:97.4 F (36.3 C), Max:98 F (36.7 C)  CBC Latest Ref Rng & Units 07/05/2018 06/28/2018 05/10/2018  WBC 4.0 - 10.5 K/uL 15.0(H) 21.5(H) 12.9(H)  Hemoglobin 13.0 - 17.0 g/dL 16.7 16.9 17.0  Hematocrit 39.0 - 52.0 % 47.8 51.4 49.8  Platelets 150 - 400 K/uL 150 206 207.0   BMP Latest Ref Rng & Units 06/28/2018 05/10/2018 04/12/2018  Glucose 70 - 99 mg/dL 100(H) 87 102(H)  BUN 6 - 20 mg/dL 8 8 9   Creatinine 0.61 - 1.24 mg/dL 1.08 0.81 0.84  BUN/Creat Ratio 6 - 22 (calc) - - -  Sodium 135 - 145 mmol/L 137 139 140  Potassium 3.5 - 5.1 mmol/L 4.3 4.4 4.3  Chloride 98 - 111 mmol/L 101 106 107  CO2 22 - 32 mmol/L 23 25 24   Calcium 8.9 - 10.3 mg/dL 9.5 9.9 9.5    Intake/Output Summary (Last 24 hours) at 07/06/2018 4132 Last data filed at 07/06/2018 0000 Gross per 24 hour  Intake 1624.66 ml  Output 50 ml  Net 1574.66 ml    Current Facility-Administered Medications:  .  acetaminophen (TYLENOL) tablet 650 mg, 650 mg, Oral, Q4H PRN, 650 mg at 07/06/18 0308 **OR** acetaminophen (TYLENOL) suppository 650 mg, 650 mg, Rectal, Q4H PRN, Earnie Larsson, MD .  bisacodyl (DULCOLAX) suppository 10 mg, 10 mg, Rectal, Daily PRN, Earnie Larsson, MD .  carvedilol (COREG) tablet 3.125 mg, 3.125 mg, Oral, BID, Earnie Larsson, MD, 3.125 mg at 07/05/18 2116 .  cilostazol (PLETAL) tablet 50 mg, 50 mg, Oral, BID, Earnie Larsson, MD, 50 mg at 07/06/18 0918 .  diazepam (VALIUM) tablet 5-10 mg, 5-10 mg, Oral, Q6H PRN, Earnie Larsson, MD, 5 mg  at 07/06/18 0309 .  gabapentin (NEURONTIN) tablet 600 mg, 600 mg, Oral, TID, Earnie Larsson, MD, 600 mg at 07/06/18 0919 .  HYDROcodone-acetaminophen (NORCO) 10-325 MG per tablet 1 tablet, 1 tablet, Oral, Q4H PRN, Pool, Mallie Mussel, MD .  menthol-cetylpyridinium (CEPACOL) lozenge 3 mg, 1 lozenge, Oral, PRN **OR** phenol (CHLORASEPTIC) mouth spray 1 spray, 1 spray, Mouth/Throat, PRN, Pool, Mallie Mussel, MD .  ondansetron (ZOFRAN) tablet 4 mg, 4 mg, Oral, Q6H PRN **OR** ondansetron (ZOFRAN) injection 4 mg, 4 mg, Intravenous, Q6H PRN, Pool, Mallie Mussel, MD .  oxyCODONE (Oxy IR/ROXICODONE) immediate release tablet 10 mg, 10 mg, Oral, Q3H PRN, Earnie Larsson, MD, 10 mg at 07/06/18 0920 .  pantoprazole (PROTONIX) EC tablet 40 mg, 40 mg, Oral, Daily, Pool, Mallie Mussel, MD, 40 mg at 07/06/18 0919 .  polyethylene glycol (MIRALAX / GLYCOLAX) packet 17 g, 17 g, Oral, Daily PRN, Earnie Larsson, MD .  rosuvastatin (CRESTOR) tablet 5 mg, 5 mg, Oral, Daily, Pool, Mallie Mussel, MD, 5 mg at 07/06/18 0919 .  sodium chloride flush (NS) 0.9 % injection 3 mL, 3 mL, Intravenous, Q12H, Pool, Henry, MD .  sodium chloride flush (NS) 0.9 % injection 3 mL, 3 mL, Intravenous, PRN, Earnie Larsson,  MD .  sodium phosphate (FLEET) 7-19 GM/118ML enema 1 enema, 1 enema, Rectal, Once PRN, Earnie Larsson, MD .  varenicline (CHANTIX) tablet 1 mg, 1 mg, Oral, BID, Pool, Mallie Mussel, MD, 1 mg at 07/06/18 0919   Physical Exam: Sitting up in bed, cervical collar in place, MAEx4 with symmetric strength  Assessment & Plan: 53 y.o. 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 -discharge home this morning  Judith Part  07/06/18 9:36 AM

## 2018-07-08 ENCOUNTER — Telehealth: Payer: Self-pay

## 2018-07-08 ENCOUNTER — Encounter (HOSPITAL_COMMUNITY): Payer: Self-pay | Admitting: Neurosurgery

## 2018-07-08 ENCOUNTER — Other Ambulatory Visit: Payer: Self-pay | Admitting: *Deleted

## 2018-07-08 DIAGNOSIS — I493 Ventricular premature depolarization: Secondary | ICD-10-CM

## 2018-07-08 NOTE — Telephone Encounter (Signed)
-----   Message from Pixie Casino, MD sent at 07/08/2018  1:31 PM EST ----- High PVC burden noted - please refer to EP [facility]) for management of high PVC burden.  Dr. Lemmie Evens

## 2018-07-08 NOTE — Patient Outreach (Addendum)
[facility]) Care Management  07/08/2018  Aryn Kops [birth year] [id number]   Transition of care telephone call  Referral received: 06/25/18 Initial outreach: 07/03/18- pre-op call Surgery date: 07/05/18 Insurance: [place]   Subjective: Initial unsuccessful telephone call to patient's home number in order to complete transition of care assessment; no answer, left HIPAA compliant voicemail message requesting return call.   Objective: Per the electronic medical record, Mr.Kerschner was hospitalized at [facility] from 2/14-2/15/20  for posterior cervical fusion with lateral mass fixation - Cervical five-Cervical six, Cervical six-Cervical seven . Comorbidities include: peripheral artery disease, GERD, arthritis of right knee, chronic left shoulder pain, claudication, primary hypercoagulable state, anxiety with depression, dyslipidemia, nicotine dependence, and  low testosterone. He was discharged to home on 07/06/18 without the need for home health services or durable medical equipment per the discharge summary.    Plan: This RNCM will route unsuccessful outreach letter with [place] Management pamphlet and 24 hour Nurse Advice Line Magnet to [place] Management clinical pool to be mailed to patient's home address. This RNCM will attempt another outreach within 4 business days.   Barrington Ellison RN,CCM,CDE [place] Management Coordinator Office Phone [phone] Office Fax [phone]

## 2018-07-08 NOTE — Telephone Encounter (Signed)
-----   Message from Pixie Casino, MD sent at 07/08/2018  1:31 PM EST ----- High PVC burden noted - please refer to EP [facility]) for management of high PVC burden.  Dr. Lemmie Evens

## 2018-07-08 NOTE — Telephone Encounter (Signed)
DPR on file. Spoke with the pt wife who sts that the pt is recovering from neck surgery on 07/05/18. Pt wife aware of holter monitor results and Dr.Hilty's recommendation. She and the pt are agreeable with the plan. Ref placed for EP consult with Dr.Camnitz. Adv pt that [facility] scheduler will call them to schedule the appt.

## 2018-07-10 MED FILL — oxyCODONE HCL 10 MG TABS: 10 | 7 days supply | Qty: 42 | Fill #0

## 2018-07-10 MED FILL — diazePAM 5 MG TABS: 5 | 7 days supply | Qty: 56 | Fill #0

## 2018-07-12 ENCOUNTER — Ambulatory Visit: Payer: 59

## 2018-07-12 ENCOUNTER — Ambulatory Visit: Payer: Self-pay | Admitting: *Deleted

## 2018-07-12 ENCOUNTER — Other Ambulatory Visit: Payer: Self-pay | Admitting: *Deleted

## 2018-07-12 NOTE — Patient Outreach (Signed)
[place]) Care Management  07/12/2018  [name] February 09, 1966 [id number]  Transition of care call Referral Received: 06/25/18 Initial outreach attempt: 07/08/18 Insurance: [employer] Save Plan  Subjective: Second unsuccessful telephone call to patient's preferred (home) number in order to complete transition of care assessment; no answer, left HIPAA compliant message requesting return call.   Objective:  Per the electronic medical record, [name] was hospitalized at [facility] from 2/14-2/15/20  for posterior cervical fusion with lateral mass fixation - Cervical five-Cervical six, Cervical six-Cervical seven . Comorbidities include: peripheral artery disease, GERD, arthritis of right knee, chronic left shoulder pain, claudication, primary hypercoagulable state, anxiety with depression, dyslipidemia, nicotine dependence, and  low testosterone. He was discharged to home on 07/06/18 without the need for home health services or durable medical equipment per the discharge summary.   Plan: This RNCM will attempt a third and final outreach within 4 business days, if no return call from patient, will close case to [place] Management services in 10 business days after initial outreach (2/28).    Barrington Ellison RN,CCM,CDE [place] Management Coordinator Office Phone [phone] Office Fax [phone]

## 2018-07-16 ENCOUNTER — Ambulatory Visit (INDEPENDENT_AMBULATORY_CARE_PROVIDER_SITE_OTHER): Payer: 59 | Admitting: General Practice

## 2018-07-16 ENCOUNTER — Other Ambulatory Visit: Payer: Self-pay | Admitting: *Deleted

## 2018-07-16 DIAGNOSIS — S129XXA Fracture of neck, unspecified, initial encounter: Secondary | ICD-10-CM | POA: Diagnosis not present

## 2018-07-16 DIAGNOSIS — Z7901 Long term (current) use of anticoagulants: Secondary | ICD-10-CM

## 2018-07-16 DIAGNOSIS — D6859 Other primary thrombophilia: Secondary | ICD-10-CM

## 2018-07-16 LAB — POCT INR: INR: 3.6 — AB (ref 2.0–3.0)

## 2018-07-16 NOTE — Patient Outreach (Signed)
[name] [facility]) Care Management  07/16/2018  [name] 29-Jul-1965 [id number]   Transition of care call Referral received: 06/25/18 Initial outreach attempt: 07/03/18- pre-op call completed;  07/08/18 -initial post hospital discharge outreach Insurance: [place] unsuccessful telephone call to patient's preferred (home) number in order to complete transition of care assessment; no answer, left HIPAA compliant message requesting return call.   Objective: WilliamChristensen was hospitalized at [facility] from 2/14-2/15/20  for posterior cervical fusion with lateral mass fixation - Cervical five-Cervical six, Cervical six-Cervical seven . Comorbidities include: peripheral artery disease, GERD, arthritis of right knee, chronic left shoulder pain, claudication, primary hypercoagulable state, anxiety with depression, dyslipidemia, nicotine dependence, and  low testosterone. He was discharged to home on 07/06/18 without the need for home health services or durable medical equipment per the discharge summary.   Plan: If no return call from patient, will close case to [place] Management services in 10 business days after initial post hospital discharge outreach. (07/19/18)  Barrington Ellison RN,CCM,CDE [place] Management Coordinator Office Phone [phone] Office Fax [phone]

## 2018-07-16 NOTE — Patient Instructions (Addendum)
Pre visit review using our clinic review tool, if applicable. No additional management support is needed unless otherwise documented below in the visit note.  Skip coumadin today and then take 6 mg daily.  Re-check in 3 weeks.  Stop Lovenox

## 2018-07-17 ENCOUNTER — Ambulatory Visit: Payer: Self-pay | Admitting: *Deleted

## 2018-07-17 MED FILL — diazePAM 5 MG TABS: 5 | 7 days supply | Qty: 56 | Fill #1

## 2018-07-17 MED FILL — oxyCODONE HCL 10 MG TABS: 10 | 7 days supply | Qty: 42 | Fill #0

## 2018-07-19 ENCOUNTER — Other Ambulatory Visit: Payer: Self-pay | Admitting: *Deleted

## 2018-07-19 NOTE — Patient Outreach (Signed)
[name]) Care Management  07/19/2018  [name] 1965/09/05 [id number]   Transition of Care Case Closure- Unsuccessful Outreach Insurance: Save Plan Referral received: 06/25/18 Initial outreach: 07/03/18 pre-op call Initial post hospital discharge outreach 07/08/18  Objective: [name] hospitalized [facility] from 2/14-2/15/20 for posterior cervical fusion with lateral mass fixation - Cervical five-Cervical six, Cervical six-Cervical seven . Comorbidities include: peripheral artery disease, GERD, arthritis of right knee, chronic left shoulder pain, claudication, primary hypercoagulable state, anxiety with depression, dyslipidemia, nicotine dependence, and low testosterone. He was discharged to home on 07/06/18 without the need for home health services or durable medical equipment per the discharge summary.  Assessment: Unable to complete post hospital discharge transition of care assessment; no return call from patient after 3 attempts and no response to request to contact this RNCM in unsuccessful outreach letter.   Plan: Case closed to [place] Management services as it has been 10 business days since initial post hospital discharge outreach attempt.  Barrington Ellison RN,CCM,CDE [place] Management Coordinator Office Phone [phone] Office Fax [phone]

## 2018-07-21 ENCOUNTER — Encounter: Payer: Self-pay | Admitting: Family Medicine

## 2018-07-21 DIAGNOSIS — E782 Mixed hyperlipidemia: Secondary | ICD-10-CM

## 2018-07-22 MED ORDER — ROSUVASTATIN CALCIUM 5 MG PO TABS: 5.0000 mg | ORAL_TABLET | Freq: Every day | ORAL | 2 refills | Status: DC

## 2018-07-22 MED FILL — ROSUVASTATIN CALCIUM 5 MG T: 5 | 90 days supply | Qty: 90 | Fill #0

## 2018-07-22 MED FILL — OXYCODONE-APAP 10-325: 10-325 | 30 days supply | Qty: 90 | Fill #0

## 2018-07-23 ENCOUNTER — Encounter: Payer: Self-pay | Admitting: Family Medicine

## 2018-07-24 MED FILL — METHYLPREDNISOLONE 4 MG TBP: 4 | 6 days supply | Qty: 21 | Fill #0

## 2018-07-25 DIAGNOSIS — H5203 Hypermetropia, bilateral: Secondary | ICD-10-CM | POA: Diagnosis not present

## 2018-07-25 DIAGNOSIS — H52223 Regular astigmatism, bilateral: Secondary | ICD-10-CM | POA: Diagnosis not present

## 2018-07-25 DIAGNOSIS — H524 Presbyopia: Secondary | ICD-10-CM | POA: Diagnosis not present

## 2018-07-28 MED FILL — CARVEDILOL 3.125 MG TABLET: 3.125 | 90 days supply | Qty: 180 | Fill #1

## 2018-07-31 ENCOUNTER — Encounter: Payer: Self-pay | Admitting: Family Medicine

## 2018-07-31 ENCOUNTER — Ambulatory Visit: Payer: 59 | Admitting: Family Medicine

## 2018-07-31 ENCOUNTER — Ambulatory Visit (INDEPENDENT_AMBULATORY_CARE_PROVIDER_SITE_OTHER): Payer: 59 | Admitting: General Practice

## 2018-07-31 ENCOUNTER — Other Ambulatory Visit: Payer: Self-pay

## 2018-07-31 VITALS — BP 126/82 | HR 67 | Temp 98.2°F | Ht 69.0 in | Wt 166.6 lb

## 2018-07-31 DIAGNOSIS — S129XXS Fracture of neck, unspecified, sequela: Secondary | ICD-10-CM | POA: Diagnosis not present

## 2018-07-31 DIAGNOSIS — Z7901 Long term (current) use of anticoagulants: Secondary | ICD-10-CM

## 2018-07-31 DIAGNOSIS — F17210 Nicotine dependence, cigarettes, uncomplicated: Secondary | ICD-10-CM

## 2018-07-31 DIAGNOSIS — I499 Cardiac arrhythmia, unspecified: Secondary | ICD-10-CM

## 2018-07-31 DIAGNOSIS — R7989 Other specified abnormal findings of blood chemistry: Secondary | ICD-10-CM | POA: Diagnosis not present

## 2018-07-31 DIAGNOSIS — R760 Raised antibody titer: Secondary | ICD-10-CM | POA: Diagnosis not present

## 2018-07-31 DIAGNOSIS — I429 Cardiomyopathy, unspecified: Secondary | ICD-10-CM

## 2018-07-31 DIAGNOSIS — G894 Chronic pain syndrome: Secondary | ICD-10-CM | POA: Diagnosis not present

## 2018-07-31 DIAGNOSIS — D500 Iron deficiency anemia secondary to blood loss (chronic): Secondary | ICD-10-CM

## 2018-07-31 DIAGNOSIS — E785 Hyperlipidemia, unspecified: Secondary | ICD-10-CM

## 2018-07-31 DIAGNOSIS — I70211 Atherosclerosis of native arteries of extremities with intermittent claudication, right leg: Secondary | ICD-10-CM | POA: Diagnosis not present

## 2018-07-31 DIAGNOSIS — I739 Peripheral vascular disease, unspecified: Secondary | ICD-10-CM

## 2018-07-31 DIAGNOSIS — I498 Other specified cardiac arrhythmias: Secondary | ICD-10-CM

## 2018-07-31 DIAGNOSIS — J439 Emphysema, unspecified: Secondary | ICD-10-CM

## 2018-07-31 DIAGNOSIS — F418 Other specified anxiety disorders: Secondary | ICD-10-CM

## 2018-07-31 DIAGNOSIS — D6859 Other primary thrombophilia: Secondary | ICD-10-CM

## 2018-07-31 DIAGNOSIS — K648 Other hemorrhoids: Secondary | ICD-10-CM

## 2018-07-31 LAB — POCT INR: INR: 2.6 (ref 2.0–3.0)

## 2018-07-31 MED ORDER — CLONAZEPAM 1 MG PO TABS: 1.0000 mg | ORAL_TABLET | Freq: Two times a day (BID) | ORAL | 0 refills | Status: DC | PRN

## 2018-07-31 MED ORDER — OXYCODONE-ACETAMINOPHEN 10-325 MG PO TABS: 1.0000 | ORAL_TABLET | Freq: Four times a day (QID) | ORAL | 0 refills | Status: DC | PRN

## 2018-07-31 MED ORDER — BUDESONIDE 180 MCG/ACT IN AEPB: 1.0000 | INHALATION_SPRAY | Freq: Two times a day (BID) | RESPIRATORY_TRACT | 3 refills | Status: DC

## 2018-07-31 MED FILL — clonazePAM 1 MG TABS: 1 | 30 days supply | Qty: 60 | Fill #0

## 2018-07-31 NOTE — Progress Notes (Signed)
[name] is a 54 y.o. male is here for follow up.  Assessment and Plan:   Cervical pseudoarthrosis [place]) Patient had uncomplicated G9-F6 posterior cervical fusion for pseudoarthrosis on 07/05/2018.  He has been followed by neurosurgery since and is doing very well.  He is still wearing the neck brace but it should be coming up soon.  He has had minimal paresthesias since the operation.  Pain continues but he is optimistic that it is improving.  Patient has been taking Valium for muscle relaxation and has done well.  He has also been taking oxycodone with added Tylenol during the day.  We discussed pain control going forward.  See orders for decision for chronic pain control.  Atherosclerosis of native arteries of extremity with intermittent claudication (HCC) Rx Pletal. Well controlled.  No signs of complications, medication side effects, or red flags.  Continue current regimen.    Nicotine dependence Currently on Chantix.  He has decreased the amount of cigarettes per day significantly.  We will continue to work on smoking cessation.  Long term (current) use of anticoagulants [Z79.01] On Coumadin. Well controlled.  No signs of complications, medication side effects, or red flags.  Continue current regimen.    Iron deficiency anemia due to chronic blood loss Lab Results  Component Value Date   WBC 15.8 (H) 07/31/2018   HGB 16.2 07/31/2018   HCT 48.0 07/31/2018   MCV 99.5 07/31/2018   PLT 290.0 07/31/2018   Lab Results  Component Value Date   IRON 79 07/31/2018   TIBC 306 07/31/2018   FERRITIN 323 07/31/2018   Well controlled.  No signs of complications, medication side effects, or red flags.  Continue current regimen.    Dyslipidemia, Rx Crestor Lab Results  Component Value Date   CHOL 140 11/19/2017   HDL 28.60 (L) 11/19/2017   LDLCALC 75 11/19/2017   TRIG 181.0 (H) 11/19/2017   CHOLHDL 5 11/19/2017   Lab Results  Component Value Date   ALT 31 07/31/2018   AST  20 07/31/2018   ALKPHOS 105 07/31/2018   BILITOT 0.7 07/31/2018    Antiphospholipid antibody positive, requires lifetime Coumadin Well controlled.  No signs of complications, medication side effects, or red flags.  Continue current regimen.    Anxiety with depression Patient is doing very well well in terms of depressive symptoms on current treatment.  He does have generalized anxiety that has worsened with his ongoing health issues.  With his history of alcoholism, he and his wife are very mindful of controlled substances and have tried to avoid benzodiazepine.  He has been given Valium over the past few weeks for muscle spasms.  Both he and his wife have noted a market improvement in his anxiety and quality of life during this time.  After long discussion regarding benefits versus risks of trialing a benzodiazepine prescription to use sparingly for the foreseeable future, we decided to trial Klonopin twice daily dosing for 2 weeks.  They will contact me before those 2 weeks are up and let me know if this is been helpful.  They will let me know if there are any issues regarding abuse of the medication.  I do think this will be helpful to his quality of life and that the benefit outweighs the risk.  Pulmonary emphysema ([place]) Stable at this time.  Reinforced smoking cessation.  Advised patient to stay away from any unnecessary crowd during the next few weeks to months.  Noted wheezing on exam today.  Will start Pulmicort.  Chronic pain disorder, cervical and lumbar spine, chronic opioid with pain contract in place, Dr. Briscoe Deutscher Prescription provided today.  Please see orders.  Database reviewed.  No concerns.  Orders Placed This Encounter  Procedures  . CBC with Differential/Platelet  . Comprehensive metabolic panel  . Iron, TIBC and Ferritin Panel   Meds ordered this encounter  Medications  . DISCONTD: clonazePAM (KLONOPIN) 1 MG tablet    Sig: Take 1 tablet (1 mg total) by mouth 2  (two) times daily as needed for anxiety.    Dispense:  60 tablet    Refill:  0  . DISCONTD: oxyCODONE-acetaminophen (PERCOCET) 10-325 MG tablet    Sig: Take 1 tablet by mouth every 6 (six) hours as needed for pain.    Dispense:  120 tablet    Refill:  0  . budesonide (PULMICORT) 180 MCG/ACT inhaler    Sig: Inhale 1 puff into the lungs 2 (two) times daily.    Dispense:  1 Inhaler    Refill:  3  . clonazePAM (KLONOPIN) 1 MG tablet    Sig: Take 1 tablet (1 mg total) by mouth 2 (two) times daily as needed for anxiety.    Dispense:  60 tablet    Refill:  0  . oxyCODONE-acetaminophen (PERCOCET) 10-325 MG tablet    Sig: Take 1 tablet by mouth every 6 (six) hours as needed for pain.    Dispense:  120 tablet    Refill:  0    Subjective:   HPI: See Assessment and Plan section for Problem Based Charting of issues discussed today.   Health Maintenance:  There are no preventive care reminders to display for this patient. Depression screen [name] 2/9 05/10/2018 07/08/2016 07/05/2016  Decreased Interest 2 0 1  Down, Depressed, Hopeless 1 0 0  PHQ - 2 Score 3 0 1  Altered sleeping 1 - -  Tired, decreased energy 1 - -  Change in appetite 2 - -  Feeling bad or failure about yourself  0 - -  Trouble concentrating 0 - -  Moving slowly or fidgety/restless 0 - -  Suicidal thoughts 0 - -  PHQ-9 Score 7 - -  Difficult doing work/chores Somewhat difficult - -  Some recent data might be hidden   PMHx, SurgHx, SocialHx, FamHx, Medications, and Allergies were reviewed in the Visit Navigator and updated as appropriate.   Patient Active Problem List   Diagnosis Date Noted  . Pulmonary emphysema ([place]) 08/04/2018  . Chronic pain disorder, cervical and lumbar spine, chronic opioid with pain contract in place, Dr. Briscoe Deutscher 08/04/2018  . Cervical pseudoarthrosis ([place]) 07/05/2018  . Chronic foot pain, right 05/11/2018  . Cervical radiculitis 01/14/2018  . Degenerative arthritis of right knee  09/06/2017  . Nonallopathic lesion of lumbosacral region 09/06/2017  . Nonallopathic lesion of thoracic region 09/06/2017  . Nonallopathic lesion of sacral region 09/06/2017  . Neutrophilia 06/18/2017  . Low testosterone in male 05/04/2017  . Erectile dysfunction 11/19/2016  . Chronic right-sided low back pain with right-sided sciatica 11/19/2016  . Internal hemorrhoid 11/19/2016  . 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 Arthritis of left acromioclavicular joint 09/29/2016  . Monitoring for anticoagulant use 09/15/2016  . Long term (current) use of anticoagulants [Z79.01] 09/15/2016  . Primary hypercoagulable state ([place]) [D68.59] 09/15/2016  . History of alcohol abuse 07/11/2016  . Chronic left shoulder pain 07/08/2016  . Iron deficiency anemia due to chronic blood loss   . Gastroesophageal reflux disease   .  Dyslipidemia, Rx Crestor 06/27/2016  . Antiphospholipid antibody positive, requires lifetime Coumadin 06/27/2016  . 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 Atherosclerosis of native arteries of extremity with intermittent claudication ([place]) 02/24/2016  . Weakness of right leg 01/11/2016  . Piriformis syndrome of right side 12/29/2015  . Leg length discrepancy 12/29/2015  . Anxiety with depression 06/24/2014  . Nicotine dependence 06/24/2014   Social History   Tobacco Use  . Smoking status: Current Every Day Smoker    Packs/day: 0.50    Years: 33.00    Pack years: 16.50    Types: Cigarettes, E-cigarettes  . Smokeless tobacco: Never Used  . Tobacco comment: taking chantix  Substance Use Topics  . Alcohol use: No    Comment: per pt recovering alcoholic in remission since 04-25-2016  . Drug use: No   Current Medications and Allergies:   .  carvedilol (COREG) 3.125 MG tablet, Take 1 tablet (3.125 mg total) by mouth 2 (two) times daily., Disp: 180 tablet, Rfl: 3 .  cilostazol (PLETAL) 50 MG tablet, Take 1 tablet (50 mg total) by mouth 2 (two) times daily., Disp: 180  tablet, Rfl: 0 .  diazepam (VALIUM) 5 MG tablet, Take 1-2 tablets (5-10 mg total) by mouth every 6 (six) hours as needed for muscle spasms., Disp: 30 tablet, Rfl: 0 .  enoxaparin (LOVENOX) 120 MG/0.8ML injection, Inject 0.8 mLs (120 mg total) into the skin daily., Disp: 7 Syringe, Rfl: 0 .  esomeprazole (NEXIUM) 20 MG capsule, Take 20 mg by mouth daily at 12 noon., Disp: , Rfl:  .  gabapentin (NEURONTIN) 600 MG tablet, Take 1 tablet (600 mg total) by mouth 3 (three) times daily., Disp: 270 tablet, Rfl: 3 .  Oxycodone HCl 10 MG TABS, Take 1 tablet (10 mg total) by mouth every 4 (four) hours as needed (pain)., Disp: 30 tablet, Rfl: 0 .  rosuvastatin (CRESTOR) 5 MG tablet, Take 1 tablet (5 mg total) by mouth daily., Disp: 90 tablet, Rfl: 2 .  sildenafil (VIAGRA) 25 MG tablet, 2-3 tabs po x 1. Take 30 minutes to 4 hours prior to sexual activity., Disp: 21 tablet, Rfl: 2 .  varenicline (CHANTIX CONTINUING MONTH PAK) 1 MG tablet, Take 1 tablet (1 mg total) by mouth 2 (two) times daily., Disp: 270 tablet, Rfl: 0 .  varenicline (CHANTIX STARTING MONTH PAK) 0.5 MG X 11 & 1 MG X 42 tablet, Take one 0.5 mg tablet by mouth once daily for 3 days, then increase to one 0.5 mg tablet twice daily for 4 days, then increase to one 1 mg tablet twice daily. (Patient not taking: Reported on 06/21/2018), Disp: 53 tablet, Rfl: 0 .  warfarin (COUMADIN) 1 MG tablet, Take 7 tablets (7 mg total) by mouth every Tuesday., Disp: , Rfl:  .  warfarin (COUMADIN) 6 MG tablet, Take 1 tablet (6 mg total) by mouth daily., Disp: , Rfl:    Allergies  Allergen Reactions  . Cymbalta [Duloxetine Hcl] Other (See Comments)    Headaches   Review of Systems   Pertinent items are noted in the HPI. Otherwise, ROS is negative.  Vitals:   Vitals:   07/31/18 1418  BP: 126/82  Pulse: 67  Temp: 98.2 F (36.8 C)  TempSrc: Oral  SpO2: 96%  Weight: 166 lb 9.6 oz (75.6 kg)  Height: 5\' 9"  (1.753 m)     Body mass index is 24.6 kg/m.    Physical Exam:   General: Cooperative, alert and oriented, well developed, well nourished,  in no acute distress, neck brace in place. HEENT: Pupils equal round reactive light and extraocular movements intact. Conjunctivae and lids unremarkable. No pallor or cyanosis, dentition good. Neck: No thyromegaly.  Cardiovascular: Regular rhythm. No murmurs appreciated.  Lungs: Normal work of breathing. Clear bilaterally without rales, rhonchi, or wheezing.  Abdomen: Soft, nontender, no masses. Normal bowel sounds. Extremities: No clubbing, cyanosis, erythema. No edema.  Skin: Warm and dry. Neurologic: No focal deficits.  Psychiatric: Normal affect and thought content.   . Reviewed expectations re: course of current medical issues. . Discussed self-management of symptoms. . Outlined signs and symptoms indicating need for more acute intervention. . Patient verbalized understanding and all questions were answered. Marland Kitchen Health Maintenance issues including appropriate healthy diet, exercise, and smoking avoidance were discussed with patient. . See orders for this visit as documented in the electronic medical record. . Patient received an After Visit Summary.  Briscoe Deutscher, DO [place] 08/04/2018

## 2018-07-31 NOTE — Patient Instructions (Signed)
Pre visit review using our clinic review tool, if applicable. No additional management support is needed unless otherwise documented below in the visit note.  Continue to take 6 mg daily.  Re-check in 4 weeks.

## 2018-08-01 LAB — CBC WITH DIFFERENTIAL/PLATELET
Basophils Absolute: 0.2 10*3/uL — ABNORMAL HIGH (ref 0.0–0.1)
Basophils Relative: 1.1 % (ref 0.0–3.0)
Eosinophils Absolute: 0.6 10*3/uL (ref 0.0–0.7)
Eosinophils Relative: 3.7 % (ref 0.0–5.0)
HCT: 48 % (ref 39.0–52.0)
Hemoglobin: 16.2 g/dL (ref 13.0–17.0)
Lymphocytes Relative: 25.6 % (ref 12.0–46.0)
Lymphs Abs: 4.1 10*3/uL — ABNORMAL HIGH (ref 0.7–4.0)
MCHC: 33.9 g/dL (ref 30.0–36.0)
MCV: 99.5 fl (ref 78.0–100.0)
Monocytes Absolute: 0.8 10*3/uL (ref 0.1–1.0)
Monocytes Relative: 5.1 % (ref 3.0–12.0)
Neutro Abs: 10.2 10*3/uL — ABNORMAL HIGH (ref 1.4–7.7)
Neutrophils Relative %: 64.5 % (ref 43.0–77.0)
Platelets: 290 10*3/uL (ref 150.0–400.0)
RBC: 4.82 Mil/uL (ref 4.22–5.81)
RDW: 13.7 % (ref 11.5–15.5)
WBC: 15.8 10*3/uL — ABNORMAL HIGH (ref 4.0–10.5)

## 2018-08-01 LAB — COMPREHENSIVE METABOLIC PANEL
ALT: 31 U/L (ref 0–53)
AST: 20 U/L (ref 0–37)
Albumin: 4.3 g/dL (ref 3.5–5.2)
Alkaline Phosphatase: 105 U/L (ref 39–117)
BUN: 8 mg/dL (ref 6–23)
CO2: 27 mEq/L (ref 19–32)
Calcium: 9.5 mg/dL (ref 8.4–10.5)
Chloride: 101 mEq/L (ref 96–112)
Creatinine, Ser: 0.84 mg/dL (ref 0.40–1.50)
GFR: 96.31 mL/min (ref >60.00)
Glucose, Bld: 85 mg/dL (ref 70–99)
Potassium: 4 mEq/L (ref 3.5–5.1)
Sodium: 138 mEq/L (ref 135–145)
Total Bilirubin: 0.7 mg/dL (ref 0.2–1.2)
Total Protein: 7 g/dL (ref 6.0–8.3)

## 2018-08-01 LAB — IRON,TIBC AND FERRITIN PANEL
%SAT: 26 % (calc) (ref 20–48)
Ferritin: 323 ng/mL (ref 38–380)
Iron: 79 ug/dL (ref 50–180)
TIBC: 306 mcg/dL (calc) (ref 250–425)

## 2018-08-02 ENCOUNTER — Encounter: Payer: Self-pay | Admitting: Cardiology

## 2018-08-04 ENCOUNTER — Encounter: Payer: Self-pay | Admitting: Family Medicine

## 2018-08-04 DIAGNOSIS — I498 Other specified cardiac arrhythmias: Secondary | ICD-10-CM | POA: Insufficient documentation

## 2018-08-04 DIAGNOSIS — I429 Cardiomyopathy, unspecified: Secondary | ICD-10-CM

## 2018-08-04 DIAGNOSIS — J439 Emphysema, unspecified: Secondary | ICD-10-CM | POA: Insufficient documentation

## 2018-08-04 DIAGNOSIS — I739 Peripheral vascular disease, unspecified: Secondary | ICD-10-CM | POA: Insufficient documentation

## 2018-08-04 DIAGNOSIS — G894 Chronic pain syndrome: Secondary | ICD-10-CM | POA: Insufficient documentation

## 2018-08-04 DIAGNOSIS — I499 Cardiac arrhythmia, unspecified: Secondary | ICD-10-CM

## 2018-08-04 HISTORY — DX: Cardiomyopathy, unspecified: I42.9

## 2018-08-04 NOTE — Assessment & Plan Note (Signed)
Patient is doing very well well in terms of depressive symptoms on current treatment.  He does have generalized anxiety that has worsened with his ongoing health issues.  With his history of alcoholism, he and his wife are very mindful of controlled substances and have tried to avoid benzodiazepine.  He has been given Valium over the past few weeks for muscle spasms.  Both he and his wife have noted a market improvement in his anxiety and quality of life during this time.  After long discussion regarding benefits versus risks of trialing a benzodiazepine prescription to use sparingly for the foreseeable future, we decided to trial Klonopin twice daily dosing for 2 weeks.  They will contact me before those 2 weeks are up and let me know if this is been helpful.  They will let me know if there are any issues regarding abuse of the medication.  I do think this will be helpful to his quality of life and that the benefit outweighs the risk.

## 2018-08-04 NOTE — Assessment & Plan Note (Signed)
Currently on Chantix.  He has decreased the amount of cigarettes per day significantly.  We will continue to work on smoking cessation.

## 2018-08-04 NOTE — Assessment & Plan Note (Signed)
Well controlled.  No signs of complications, medication side effects, or red flags.  Continue current regimen.   

## 2018-08-04 NOTE — Assessment & Plan Note (Signed)
On Coumadin. Well controlled.  No signs of complications, medication side effects, or red flags.  Continue current regimen.

## 2018-08-04 NOTE — Assessment & Plan Note (Signed)
Rx Pletal. Well controlled.  No signs of complications, medication side effects, or red flags.  Continue current regimen.

## 2018-08-04 NOTE — Assessment & Plan Note (Signed)
Prescription provided today.  Please see orders.  Database reviewed.  No concerns.

## 2018-08-04 NOTE — Assessment & Plan Note (Signed)
Stable at this time.  Reinforced smoking cessation.  Advised patient to stay away from any unnecessary crowd during the next few weeks to months.  Noted wheezing on exam today.  Will start Pulmicort.

## 2018-08-04 NOTE — Assessment & Plan Note (Signed)
Lab Results  Component Value Date   CHOL 140 11/19/2017   HDL 28.60 (L) 11/19/2017   LDLCALC 75 11/19/2017   TRIG 181.0 (H) 11/19/2017   CHOLHDL 5 11/19/2017   Lab Results  Component Value Date   ALT 31 07/31/2018   AST 20 07/31/2018   ALKPHOS 105 07/31/2018   BILITOT 0.7 07/31/2018

## 2018-08-04 NOTE — Assessment & Plan Note (Signed)
Lab Results  Component Value Date   WBC 15.8 (H) 07/31/2018   HGB 16.2 07/31/2018   HCT 48.0 07/31/2018   MCV 99.5 07/31/2018   PLT 290.0 07/31/2018   Lab Results  Component Value Date   IRON 79 07/31/2018   TIBC 306 07/31/2018   FERRITIN 323 07/31/2018   Well controlled.  No signs of complications, medication side effects, or red flags.  Continue current regimen.

## 2018-08-04 NOTE — Assessment & Plan Note (Signed)
Patient had uncomplicated G2-I9 posterior cervical fusion for pseudoarthrosis on 07/05/2018.  He has been followed by neurosurgery since and is doing very well.  He is still wearing the neck brace but it should be coming up soon.  He has had minimal paresthesias since the operation.  Pain continues but he is optimistic that it is improving.  Patient has been taking Valium for muscle relaxation and has done well.  He has also been taking oxycodone with added Tylenol during the day.  We discussed pain control going forward.  See orders for decision for chronic pain control.

## 2018-08-05 ENCOUNTER — Telehealth: Payer: Self-pay

## 2018-08-05 ENCOUNTER — Encounter: Payer: Self-pay | Admitting: Family Medicine

## 2018-08-05 NOTE — Telephone Encounter (Signed)
Delfin Serafina Mitchell KeyShelby Dubin - PA Case ID: [id number] Need help? Call us at [phone]  Status  Sent to [employer]  DrugPulmicort Flexhaler 180MCG/ACT aerosol powder  Form[employer]act ePA Form

## 2018-08-07 ENCOUNTER — Encounter: Payer: Self-pay | Admitting: Family Medicine

## 2018-08-07 NOTE — Telephone Encounter (Signed)
Left message to return call to our office.  

## 2018-08-08 ENCOUNTER — Other Ambulatory Visit: Payer: Self-pay

## 2018-08-08 ENCOUNTER — Encounter: Payer: Self-pay | Admitting: Family Medicine

## 2018-08-08 MED ORDER — FLUOXETINE HCL 20 MG PO TABS: 20.0000 mg | ORAL_TABLET | Freq: Every day | ORAL | 3 refills | Status: DC

## 2018-08-09 MED FILL — OXYCODONE-APAP 10-325: 10-325 | 30 days supply | Qty: 120 | Fill #0

## 2018-08-09 MED FILL — FLUoxetine HCL 20 MG TABS: 20 | 90 days supply | Qty: 90 | Fill #0

## 2018-08-11 ENCOUNTER — Encounter: Payer: Self-pay | Admitting: Family Medicine

## 2018-08-13 ENCOUNTER — Other Ambulatory Visit: Payer: Self-pay

## 2018-08-13 ENCOUNTER — Telehealth (INDEPENDENT_AMBULATORY_CARE_PROVIDER_SITE_OTHER): Payer: Medicare Other | Admitting: Cardiology

## 2018-08-13 DIAGNOSIS — I428 Other cardiomyopathies: Secondary | ICD-10-CM | POA: Diagnosis not present

## 2018-08-13 DIAGNOSIS — I493 Ventricular premature depolarization: Secondary | ICD-10-CM

## 2018-08-13 MED ORDER — CARVEDILOL 6.25 MG PO TABS: 6.2500 mg | ORAL_TABLET | Freq: Two times a day (BID) | ORAL | 3 refills | Status: DC

## 2018-08-13 MED ORDER — MEXILETINE HCL 150 MG PO CAPS: 150.0000 mg | ORAL_CAPSULE | Freq: Three times a day (TID) | ORAL | 3 refills | Status: DC

## 2018-08-13 NOTE — Progress Notes (Signed)
Electrophysiology TeleHealth Note   Due to national recommendations of social distancing due to COVID 19, an audio/video telehealth visit is felt to be most appropriate for this patient at this time.  See MyChart message from today for the patient's consent to telehealth for [facility].   Date:  08/13/2018   ID:  [name], DOB [birth year], MRN [id number]  Location: patient's home  Provider location: [address], [place]  Evaluation Performed: Follow-up visit  PCP:  William Deutscher, DO  Cardiologist:  William Casino, MD  Electrophysiologist:  [name]   Chief Complaint:  PVCs  History of Present Illness:    [name] is a 53 y.o. male who presents via audio/video conferencing for a telehealth visit today.  Since last being seen in our clinic, the patient reports doing very well.  Today, he denies symptoms of palpitations, chest pain, shortness of breath,  lower extremity edema, dizziness, presyncope, or syncope.  The patient is otherwise without complaint today.  The patient denies symptoms of fevers, chills, cough, or new SOB worrisome for COVID 19.   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  He has some chest pain as well as some shortness of breath.  This chest pain occurs mainly in his lower chest.  He attributes them to potentially his PVCs, though he is not having palpitations.  He did have a cardiac CT scan that showed nonobstructive coronary artery disease but an increased calcium score.  There are no exacerbating or alleviating factors for his chest pain.  He is also in a c-collar due to neck surgery on February 14.  Past Medical History:  Diagnosis Date  . Anticoagulated on Coumadin   . Antiphospholipid antibody positive 06/27/2016   followed by dr [name] ([facility])  .  Anxiety with depression 06/24/2014  . Chronic back pain   . Dysrhythmia    PVCs  . ED (erectile dysfunction)   . GERD (gastroesophageal reflux disease)   . Hemorrhoids, internal, with bleeding   . Hiatal hernia   . History of adenomatous polyp of colon    07-08-2016  tubular adenoma's  . History of atrial fibrillation    episode 04/ 2018  . History of DVT of lower extremity   . Hyperlipidemia   . Internal hemorrhoid 11/19/2016  . Iron deficiency anemia due to chronic blood loss followed by dr [name] ([facility])   from hemorrhoids and on coumadin-- hx Iron infusion's and blood transfusion's x2 07-09-2016  . Neuropathy, peripheral    right foot numbness from lumbar pinched nerve  . OA (osteoarthritis)   . PAD (peripheral artery disease) ([place]) followed by dr Bridgett Larsson--  per LOV note ABI 11-14-2016  widely patent stent/  11-27-2016 per pt no claudication symptoms   hx right CIA angioplasty and stent 02-24-2016/  right CIA thromectomy and patch angioplasty for restenosis 07-18-2015  . Primary hypercoagulable state ([place]) [D68.59] 09/15/2016  . Recovering alcoholic in remission ([place])    per pt since 04-25-2016  . S/P insertion of iliac artery stent    02-23-2017  right CIA PTA and stent/  05-11-2016  in-stent restenosis  s/p  thrombectomy/  07-17-2016 thrombectomy and  patch angioplasty right femoral artery   . Smokers' cough ([place])   . Wears glasses     Past Surgical History:  Procedure Laterality Date  .  ANGIOPLASTY ILLIAC ARTERY Right 07/17/2016   Procedure: ANGIOPLASTY RIGHT COMMON ILIAC ARTERY;  Surgeon: William Seville, MD;  Location: [place];  Service: Vascular;  Laterality: Right;  . ANTERIOR CERVICAL DECOMP/DISCECTOMY FUSION  07/21/2010   C5 -- C7  . CARDIOVASCULAR STRESS TEST  06-14-2016   dr hilty   normal perfusion study w/ no reversible ischemia/  stress ef 44% but visually looks better , echo ordered (LVEF 30-44%)  , normal LV wall motion   . COLONOSCOPY N/A 07/08/2016    Procedure: COLONOSCOPY;  Surgeon: William Essex, MD;  Location: [facility];  Service: Endoscopy;  Laterality: N/A;  . ESOPHAGOGASTRODUODENOSCOPY N/A 07/08/2016   Procedure: ESOPHAGOGASTRODUODENOSCOPY (EGD);  Surgeon: William Essex, MD;  Location: [facility];  Service: Endoscopy;  Laterality: N/A;  . GROIN DEBRIDEMENT Right 07/25/2016   Procedure: EVACUATION HEMATOMA RIGHT GROIN;  Surgeon: William Sandy, MD;  Location: [place];  Service: Vascular;  Laterality: Right;  . HEMORRHOID SURGERY  2013  approx.   ?lanced/ banding  . HEMORRHOID SURGERY N/A 12/07/2016   Procedure: 3 COLUMN HEMORRHOIDECTOMY;  Surgeon: William Ruff, MD;  Location: [facility];  Service: General;  Laterality: N/A;  . INTRAOPERATIVE ARTERIOGRAM Right 07/17/2016   Procedure: INTRA OPERATIVE ANGIOGRAM OF RIGHT COMMON ILIAC ARTERY;  Surgeon: William Wildwood, MD;  Location: [place];  Service: Vascular;  Laterality: Right;  . KNEE SURGERY Right 1983   cartilage  . PATCH ANGIOPLASTY Right 07/17/2016   Procedure: PATCH ANGIOPLASTY RIGHT FEMORAL ARTERY USING [name] BIOLOGIC PATCH;  Surgeon: William Justice, MD;  Location: [place];  Service: Vascular;  Laterality: Right;  . PERIPHERAL VASCULAR CATHETERIZATION N/A 02/24/2016   Procedure: Abdominal Aortogram w/Lower Extremity;  Surgeon: William Dallesport, MD;  Location: [place] [facility];  Service: Cardiovascular;  Laterality: N/A;  . PERIPHERAL VASCULAR CATHETERIZATION Right 02/24/2016   Procedure: Peripheral Vascular Intervention;  Surgeon: William Richboro, MD;  Location: [place] [facility];  Service: Cardiovascular;  Laterality: Right;  Common iliac  . PERIPHERAL VASCULAR CATHETERIZATION N/A 05/11/2016   Procedure: Abdominal Aortogram;  Surgeon: William Williston, MD;  Location: [place] [facility];  Service: Cardiovascular;  Laterality: N/A;  . PERIPHERAL VASCULAR CATHETERIZATION N/A 05/11/2016   Procedure: Lower Extremity Angiography;  Surgeon: William Plano, MD;  Location: [place]  [facility];  Service: Cardiovascular;  Laterality: N/A;  . POSTERIOR CERVICAL FUSION/FORAMINOTOMY N/A 07/05/2018   Procedure: Posterior cervical fusion with lateral mass fixation - Cervical five-Cervical six, Cervical six-Cervical seven;  Surgeon: Earnie Larsson, MD;  Location: [place];  Service: Neurosurgery;  Laterality: N/A;  . THROMBECTOMY FEMORAL ARTERY Right 07/17/2016   Procedure: THROMBECTOMY RIGHT FEMORAL ARTERY;  Surgeon: William Muscoy, MD;  Location: [place];  Service: Vascular;  Laterality: Right;  . TRANSTHORACIC ECHOCARDIOGRAM  06-20-2016   dr hilty   ef 55-60%,  grade 1 diastolic dysfunction/  mild TR    Current Outpatient Medications  Medication Sig Dispense Refill  . budesonide (PULMICORT) 180 MCG/ACT inhaler Inhale 1 puff into the lungs 2 (two) times daily. 1 Inhaler 3  . cilostazol (PLETAL) 50 MG tablet Take 1 tablet (50 mg total) by mouth 2 (two) times daily. 180 tablet 0  . clonazePAM (KLONOPIN) 1 MG tablet Take 1 tablet (1 mg total) by mouth 2 (two) times daily as needed for anxiety. 60 tablet 0  . esomeprazole (NEXIUM) 20 MG capsule Take 20 mg by mouth daily at 12 noon.    . gabapentin (NEURONTIN) 600 MG  tablet Take 1 tablet (600 mg total) by mouth 3 (three) times daily. 270 tablet 3  . oxyCODONE-acetaminophen (PERCOCET) 10-325 MG tablet Take 1 tablet by mouth every 6 (six) hours as needed for pain. 120 tablet 0  . rosuvastatin (CRESTOR) 5 MG tablet Take 1 tablet (5 mg total) by mouth daily. 90 tablet 2  . sildenafil (VIAGRA) 25 MG tablet 2-3 tabs po x 1. Take 30 minutes to 4 hours prior to sexual activity. 21 tablet 2  . varenicline (CHANTIX CONTINUING MONTH PAK) 1 MG tablet Take 1 tablet (1 mg total) by mouth 2 (two) times daily. 270 tablet 0  . varenicline (CHANTIX STARTING MONTH PAK) 0.5 MG X 11 & 1 MG X 42 tablet Take one 0.5 mg tablet by mouth once daily for 3 days, then increase to one 0.5 mg tablet twice daily for 4 days, then increase to one 1 mg tablet twice daily. 53 tablet 0   . warfarin (COUMADIN) 1 MG tablet Take 7 tablets (7 mg total) by mouth every Tuesday.    . warfarin (COUMADIN) 6 MG tablet Take 1 tablet (6 mg total) by mouth daily.    . carvedilol (COREG) 3.125 MG tablet Take 1 tablet (3.125 mg total) by mouth 2 (two) times daily. 180 tablet 3  . FLUoxetine (PROZAC) 20 MG tablet Take 1 tablet (20 mg total) by mouth daily. (Patient not taking: Reported on 08/13/2018) 90 tablet 3   No current facility-administered medications for this visit.     Allergies:   Cymbalta [duloxetine hcl]   Social History:  The patient  reports that he has been smoking cigarettes and e-cigarettes. He has a 16.50 pack-year smoking history. He has never used smokeless tobacco. He reports that he does not drink alcohol or use drugs.   Family History:  The patient's  family history includes Cancer in his father.   ROS:  Please see the history of present illness.   All other systems are personally reviewed and negative.    Exam:    Vital Signs:  Temp (!) 97.5 F (36.4 C)   Wt 177 lb (80.3 kg)   BMI 26.14 kg/m    Well appearing, alert and conversant, regular work of breathing,  good skin color Eyes- anicteric, neuro- grossly intact, skin- no apparent rash or lesions or cyanosis, mouth- oral mucosa is pink   Labs/Other Tests and Data Reviewed:    Recent Labs: 07/31/2018: ALT 31; BUN 8; Creatinine, Ser 0.84; Hemoglobin 16.2; Platelets 290.0; Potassium 4.0; Sodium 138   Wt Readings from Last 3 Encounters:  08/13/18 177 lb (80.3 kg)  07/31/18 166 lb 9.6 oz (75.6 kg)  07/05/18 170 lb (77.1 kg)     Other studies personally reviewed: Additional studies/ records that were reviewed today include: TTE 04/12/18  Review of the above records today demonstrates:  - Left ventricle: The cavity size was normal. Wall thickness was   normal. Systolic function was mildly to moderately reduced. The   estimated ejection fraction was in the range of 40% to 45%.   Diffuse hypokinesis. The  study is not technically sufficient to   allow evaluation of LV diastolic function.  Holter 07/08/18 - personally reviewed Sinus rhythm with frequent PVC's. High PVC burden of 22%.   ECG 04/03/18 - personally reviewed SR, PVCs, ventricular bigeminy   ASSESSMENT & PLAN:    1.  PVCs: Patient having up to 22% PVCs.  Most recent echo also shows a decrease in his ejection fraction.  He  was put on carvedilol which she has tolerated well.  We Rafiel Mecca thus increase his carvedilol dose.  I am concerned that he is needing to have a PVC induced cardiomyopathy.  He does not have obstructive coronary artery disease.  Due to that, we Janavia Rottman start him on mexiletine 150 mg twice a day.  I Ortencia Askari see him back in 3 months and get an ECG at that time.  If he has minimal PVCs, Keenya Matera likely continue mexiletine, otherwise Makeyla Govan potentially plan for ablation.  I am concerned that he does have some coronary artery disease and thus flecainide would be a poor option.  2.  Nonischemic cardiomyopathy: Could be due to PVCs as his burden is up at 22%.  Starting mexiletine which Jaimon Bugaj hopefully improve his PVC burden.  He Justyn Boyson need a follow-up echo in a few months.  COVID 19 screen The patient denies symptoms of COVID 19 at this time.  The importance of social distancing was discussed today.  Follow-up: 3 months  Current medicines are reviewed at length with the patient today.   The patient does not have concerns regarding his medicines.  The following changes were made today: Increase carvedilol, start mexiletine  Labs/ tests ordered today include:  No orders of the defined types were placed in this encounter.  Case discussed with primary cardiology  Patient Risk:  after full review of this patients clinical status, I feel that they are at moderate risk at this time.  Today, I have spent 25 minutes with the patient with telehealth technology discussing PVCs.    Signed, Ayvin Lipinski Meredith Leeds, MD  08/13/2018 2:29 PM     [place] [address] [place] [address] [phone] (office) [phone] (fax)

## 2018-08-14 ENCOUNTER — Telehealth: Payer: Self-pay

## 2018-08-14 MED ORDER — MEXILETINE HCL 150 MG PO CAPS: 150.0000 mg | ORAL_CAPSULE | Freq: Two times a day (BID) | ORAL | 3 refills | Status: DC

## 2018-08-14 MED FILL — MEXILETINE 150 MG CAPSULE: 150 | 20 days supply | Qty: 60 | Fill #0

## 2018-08-14 NOTE — Telephone Encounter (Signed)
Need to follow up auth that we sent in for inhaler.

## 2018-08-14 NOTE — Addendum Note (Signed)
Addended by: Stanton Kidney on: 08/14/2018 09:14 AM   Modules accepted: Orders

## 2018-08-18 ENCOUNTER — Encounter: Payer: Self-pay | Admitting: Family Medicine

## 2018-08-19 MED ORDER — CILOSTAZOL 50 MG PO TABS: 50.0000 mg | ORAL_TABLET | Freq: Two times a day (BID) | ORAL | 3 refills | Status: DC

## 2018-08-19 MED FILL — CILOSTAZOL 50 MG TABLET: 50 | 90 days supply | Qty: 180 | Fill #0

## 2018-08-20 ENCOUNTER — Other Ambulatory Visit: Payer: Self-pay | Admitting: Physical Therapy

## 2018-08-20 MED ORDER — FLUTICASONE PROPIONATE HFA 220 MCG/ACT IN AERO: 2.0000 | INHALATION_SPRAY | Freq: Every day | RESPIRATORY_TRACT | 5 refills | Status: DC

## 2018-08-20 NOTE — Telephone Encounter (Signed)
Dr. Juleen China,  PA review received for Pulmicort Flexhaler 171mcg for [employer].  Called pt to ask if he has tried any of the preferred step therapy agents (Arnuity Ellipta, Flovent Diskus, Flovent HFA or Qvar) and he denied trying any of these.  Review is asking if there is a medical reason why the pt cannot try any of the above listed step therapy drugs.  Please advise.

## 2018-08-20 NOTE — Telephone Encounter (Signed)
Rx pending for Flovent HFA 220, 2 puffs once/day.  Forwarding to Dr. Juleen China for review / to approve.

## 2018-08-20 NOTE — Telephone Encounter (Signed)
[name] has checked up on this and send Dr. Juleen China message on it already.

## 2018-08-20 NOTE — Telephone Encounter (Signed)
Okay flovent or qvar.

## 2018-08-21 ENCOUNTER — Telehealth: Payer: Self-pay | Admitting: Family Medicine

## 2018-08-21 MED ORDER — FLUTICASONE PROPIONATE HFA 220 MCG/ACT IN AERO: 2.0000 | INHALATION_SPRAY | Freq: Two times a day (BID) | RESPIRATORY_TRACT | 4 refills | Status: DC

## 2018-08-21 MED FILL — CARVEDILOL 6.25 MG TABLET: 6.25 | 90 days supply | Qty: 180 | Fill #0

## 2018-08-21 NOTE — Telephone Encounter (Signed)
Copied from [employer] [id number]. Topic: General - Other >> Aug 21, 2018 10:12 AM Antonieta Iba C wrote: Reason for CRM: pharmacy called in to be advised on Rx for fluticasone (FLOVENT HFA) 220 MCG/ACT inhaler. She says that the instructions are stating daily and usually this medication is twice daily.   Please advise.   CB: [phone] [facility]

## 2018-08-21 NOTE — Telephone Encounter (Signed)
Yes.  BID

## 2018-08-21 NOTE — Addendum Note (Signed)
Addended by: Briscoe Deutscher R on: 08/21/2018 11:02 AM   Modules accepted: Orders

## 2018-08-23 MED FILL — FLOVENT HFA 220 MCG INHALER: 220 | 90 days supply | Qty: 36 | Fill #0

## 2018-08-23 NOTE — Telephone Encounter (Signed)
Called pt and informed of the change in medication from Pulmicort to Flovent HFA.  Pt verbalized understanding and did not have any questions.

## 2018-08-28 ENCOUNTER — Ambulatory Visit: Payer: 59

## 2018-08-31 ENCOUNTER — Encounter: Payer: Self-pay | Admitting: Family Medicine

## 2018-08-31 DIAGNOSIS — G894 Chronic pain syndrome: Secondary | ICD-10-CM

## 2018-09-01 MED ORDER — OXYCODONE-ACETAMINOPHEN 10-325 MG PO TABS: 1.0000 | ORAL_TABLET | Freq: Three times a day (TID) | ORAL | 0 refills | Status: DC | PRN

## 2018-09-04 ENCOUNTER — Other Ambulatory Visit: Payer: Self-pay

## 2018-09-04 ENCOUNTER — Ambulatory Visit (INDEPENDENT_AMBULATORY_CARE_PROVIDER_SITE_OTHER): Payer: 59 | Admitting: General Practice

## 2018-09-04 DIAGNOSIS — Z7901 Long term (current) use of anticoagulants: Secondary | ICD-10-CM

## 2018-09-04 DIAGNOSIS — D6859 Other primary thrombophilia: Secondary | ICD-10-CM

## 2018-09-04 LAB — POCT INR: INR: 2.6 (ref 2.0–3.0)

## 2018-09-04 MED FILL — OXYCODONE-APAP 10-325: 10-325 | 30 days supply | Qty: 90 | Fill #0

## 2018-09-04 NOTE — Progress Notes (Signed)
I have reviewed the results and agree with this plan   

## 2018-09-04 NOTE — Patient Instructions (Signed)
Pre visit review using our clinic review tool, if applicable. No additional management support is needed unless otherwise documented below in the visit note.  Continue to take 6 mg daily.  Re-check in 6 weeks.

## 2018-09-16 MED FILL — GABAPENTIN 600 MG TABLET: 600 | 90 days supply | Qty: 270 | Fill #3

## 2018-09-16 MED FILL — WARFARIN SODIUM 6 MG TABLET: 6 | 90 days supply | Qty: 105 | Fill #0

## 2018-09-23 MED FILL — MEXILETINE 150 MG CAPSULE: 150 | 30 days supply | Qty: 60 | Fill #0

## 2018-09-23 NOTE — Telephone Encounter (Addendum)
PA for Pulmicort has been denied.   Dr Juleen China has changed rx to [facility]. See telephone encounter 08/21/18.

## 2018-09-30 ENCOUNTER — Encounter: Payer: Self-pay | Admitting: Family Medicine

## 2018-09-30 DIAGNOSIS — G894 Chronic pain syndrome: Secondary | ICD-10-CM

## 2018-10-01 IMAGING — DX DG CHEST 2V
2 series · 2 of 2 positions shown · non-contrast
Comparison: None.

CLINICAL DATA: Cough, congestion for 2 weeks

EXAM:
CHEST  2 VIEW

[chest pa]
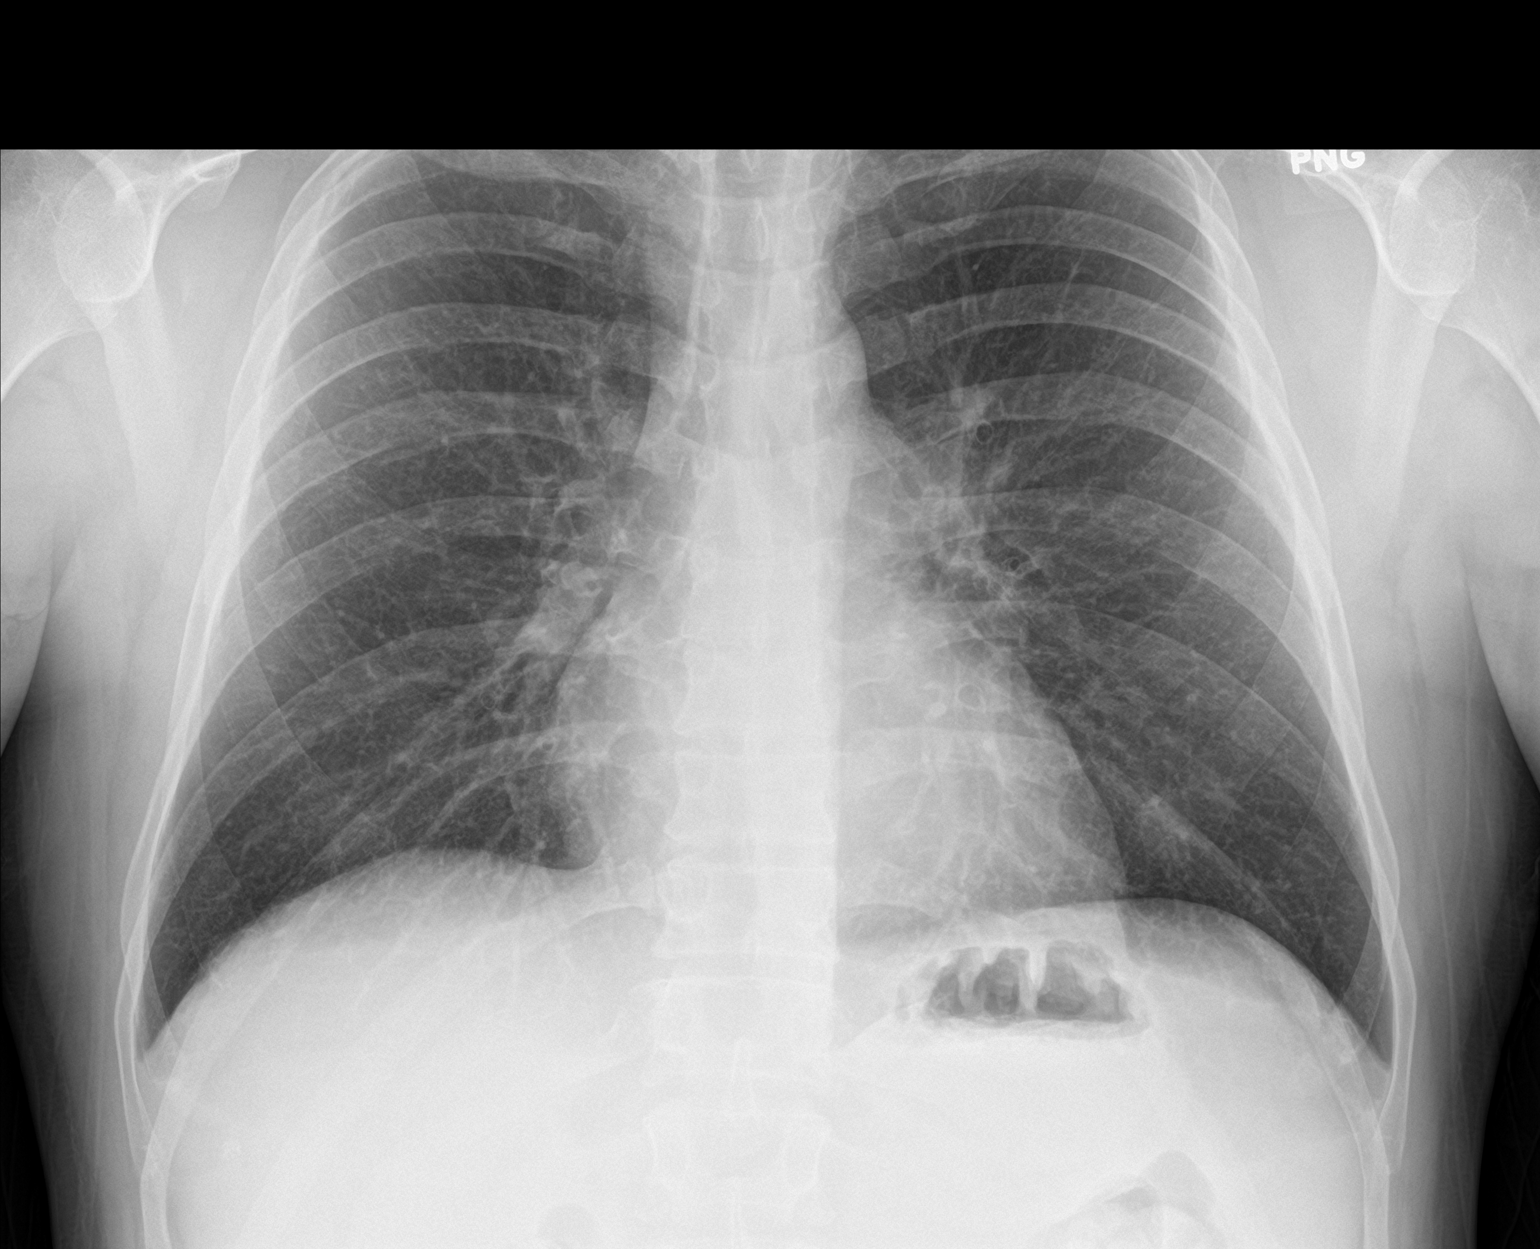

[chest lat]
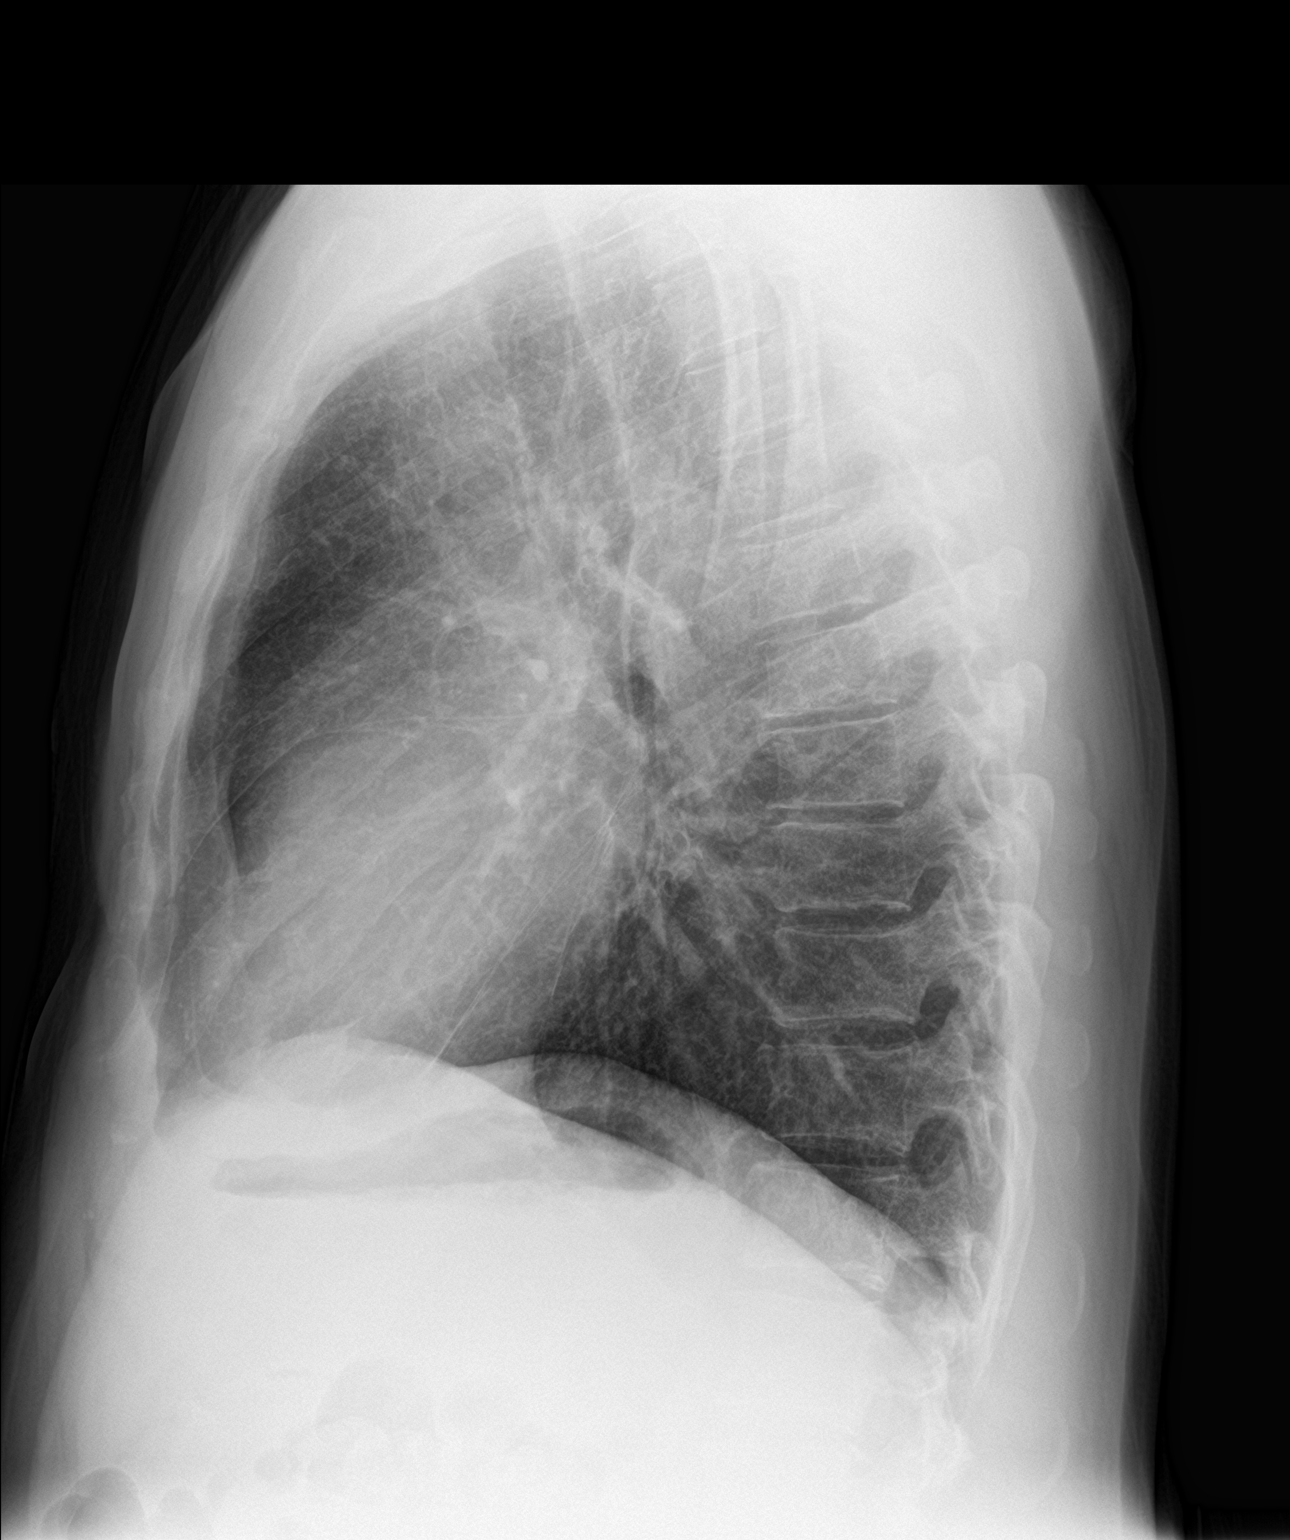

[2 of 2 positions shown; findings below may reference images not displayed]

FINDINGS: The heart size and mediastinal contours are within normal limits.
Both lungs are clear. The visualized skeletal structures are
unremarkable.
IMPRESSION: No active cardiopulmonary disease.

## 2018-10-01 MED ORDER — OXYCODONE-ACETAMINOPHEN 10-325 MG PO TABS: 1.0000 | ORAL_TABLET | Freq: Three times a day (TID) | ORAL | 0 refills | Status: DC | PRN

## 2018-10-01 MED FILL — OXYCODONE-APAP 10-325: 10-325 | 30 days supply | Qty: 90 | Fill #0

## 2018-10-01 NOTE — Telephone Encounter (Signed)
Ok to fill 

## 2018-10-10 ENCOUNTER — Other Ambulatory Visit: Payer: Self-pay

## 2018-10-10 ENCOUNTER — Other Ambulatory Visit (INDEPENDENT_AMBULATORY_CARE_PROVIDER_SITE_OTHER): Payer: 59

## 2018-10-10 DIAGNOSIS — D500 Iron deficiency anemia secondary to blood loss (chronic): Secondary | ICD-10-CM

## 2018-10-10 LAB — CBC WITH DIFFERENTIAL/PLATELET
Basophils Absolute: 0.1 10*3/uL (ref 0.0–0.1)
Basophils Relative: 0.8 % (ref 0.0–3.0)
Eosinophils Absolute: 0.5 10*3/uL (ref 0.0–0.7)
Eosinophils Relative: 4.3 % (ref 0.0–5.0)
HCT: 48.6 % (ref 39.0–52.0)
Hemoglobin: 16.7 g/dL (ref 13.0–17.0)
Lymphocytes Relative: 21.6 % (ref 12.0–46.0)
Lymphs Abs: 2.2 10*3/uL (ref 0.7–4.0)
MCHC: 34.3 g/dL (ref 30.0–36.0)
MCV: 101.5 fl — ABNORMAL HIGH (ref 78.0–100.0)
Monocytes Absolute: 0.5 10*3/uL (ref 0.1–1.0)
Monocytes Relative: 5.3 % (ref 3.0–12.0)
Neutro Abs: 7 10*3/uL (ref 1.4–7.7)
Neutrophils Relative %: 68 % (ref 43.0–77.0)
Platelets: 272 10*3/uL (ref 150.0–400.0)
RBC: 4.79 Mil/uL (ref 4.22–5.81)
RDW: 13.8 % (ref 11.5–15.5)
WBC: 10.4 10*3/uL (ref 4.0–10.5)

## 2018-10-11 LAB — IRON, TOTAL/TOTAL IRON BINDING CAP
%SAT: 31 % (calc) (ref 20–48)
Iron: 84 ug/dL (ref 50–180)
TIBC: 272 mcg/dL (calc) (ref 250–425)

## 2018-10-15 MED FILL — ROSUVASTATIN CALCIUM 5 MG T: 5 | 90 days supply | Qty: 90 | Fill #1

## 2018-10-16 ENCOUNTER — Ambulatory Visit (INDEPENDENT_AMBULATORY_CARE_PROVIDER_SITE_OTHER): Payer: 59 | Admitting: General Practice

## 2018-10-16 ENCOUNTER — Other Ambulatory Visit: Payer: Self-pay

## 2018-10-16 DIAGNOSIS — Z7901 Long term (current) use of anticoagulants: Secondary | ICD-10-CM | POA: Diagnosis not present

## 2018-10-16 LAB — POCT INR: INR: 2.4 (ref 2.0–3.0)

## 2018-10-16 NOTE — Patient Instructions (Signed)
Pre visit review using our clinic review tool, if applicable. No additional management support is needed unless otherwise documented below in the visit note.  Continue to take 6 mg daily.  Re-check in 6 weeks.

## 2018-10-16 NOTE — Progress Notes (Signed)
I have reviewed the results and agree with this plan   

## 2018-10-22 MED FILL — MEXILETINE 150 MG CAPSULE: 150 | 30 days supply | Qty: 60 | Fill #1

## 2018-10-23 ENCOUNTER — Encounter: Payer: Self-pay | Admitting: Family Medicine

## 2018-10-23 ENCOUNTER — Other Ambulatory Visit: Payer: Self-pay

## 2018-10-23 MED ORDER — PREDNISOLONE 5 MG (21) PO TBPK: 5.0000 mg | ORAL_TABLET | ORAL | 0 refills | Status: DC

## 2018-10-23 MED FILL — predniSONE 5 MG (21) TBPK: 5 | 6 days supply | Qty: 21 | Fill #0

## 2018-10-24 NOTE — Progress Notes (Signed)
Virtual Visit via Video   Due to the COVID-19 pandemic, this visit was completed with telemedicine (audio/video) technology to reduce patient and provider exposure as well as to preserve personal protective equipment.   I connected with [name] by a video enabled telemedicine application and verified that I am speaking with the correct person using two identifiers. Location patient: Home Location provider: [facility], Office Persons participating in the virtual visit: Shandon Burlingame, Briscoe Deutscher, DO   I discussed the limitations of evaluation and management by telemedicine and the availability of in person appointments. The patient expressed understanding and agreed to proceed.  Care Team   Patient Care Team: Briscoe Deutscher, DO as PCP - General (Family Medicine) Debara Pickett Nadean Corwin, MD as PCP - Cardiology (Cardiology) Constance Haw, MD as PCP - Electrophysiology (Cardiology) Debara Pickett, Nadean Corwin, MD as Consulting Physician (Cardiology) Brunetta Genera, MD as Consulting Physician (Hematology) Clarene Essex, MD as Consulting Physician (Gastroenterology) Conrad Valmy, MD (Inactive) as Consulting Physician (Vascular Surgery) Earnie Larsson, MD as Consulting Physician (Neurosurgery) Loletha Carrow Kirke Corin, MD as Consulting Physician (Gastroenterology) Brunetta Genera, MD as Consulting Physician (Hematology)  Subjective:   HPI:   Lumbar pain. Radiates to legs. Worsening. Sometimes feels like legs are going to give out. MRI 2018: IMPRESSION: 1. Shallow right foraminal/extraforaminal disc protrusion at L4-5 without direct neural compression but potential irritation the right L4 nerve root. 2. Shallow central and left paracentral disc protrusion at L5-S1 without definite neural compression. Percocet TID not enough. Just started Prednisone. No new injury. Okay with OMT, PT, injection.  Review of Systems  Constitutional: Negative for chills and fever.  HENT: Negative for  hearing loss and tinnitus.   Eyes: Negative for blurred vision and double vision.  Respiratory: Negative for cough.   Cardiovascular: Negative for chest pain and palpitations.  Gastrointestinal: Negative for nausea and vomiting.  Genitourinary: Negative for dysuria and urgency.  Neurological: Negative for dizziness and headaches.  Psychiatric/Behavioral: Negative for depression and suicidal ideas.    Patient Active Problem List   Diagnosis Date Noted  . Pulmonary emphysema ([place]) 08/04/2018  . Chronic pain disorder, cervical and lumbar spine, chronic opioid with pain contract in place, Dr. Briscoe Deutscher 08/04/2018  . 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 Cardiomyopathy ([place]) 08/04/2018  . Ventricular bigeminy 08/04/2018  . Cervical pseudoarthrosis ([place]) 07/05/2018  . Chronic foot pain, right 05/11/2018  . Cervical radiculitis 01/14/2018  . Degenerative arthritis of right knee 09/06/2017  . Nonallopathic lesion of lumbosacral region 09/06/2017  . Nonallopathic lesion of thoracic region 09/06/2017  . Nonallopathic lesion of sacral region 09/06/2017  . Neutrophilia 06/18/2017  . Low testosterone in male 05/04/2017  . Erectile dysfunction 11/19/2016  . Chronic right-sided low back pain with right-sided sciatica 11/19/2016  . 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 Arthritis of left acromioclavicular joint 09/29/2016  . Monitoring for anticoagulant use 09/15/2016  . Long term (current) use of anticoagulants [Z79.01] 09/15/2016  . Primary hypercoagulable state ([place]) [D68.59] 09/15/2016  . History of alcohol abuse 07/11/2016  . Iron deficiency anemia due to chronic blood loss   . Gastroesophageal reflux disease   . Dyslipidemia, Rx Crestor 06/27/2016  . Antiphospholipid antibody positive, requires lifetime Coumadin 06/27/2016  . PAD (peripheral artery disease) ([place]) 06/02/2016  . 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 Atherosclerosis of  native arteries of extremity with intermittent claudication ([place]) 02/24/2016  . Weakness of right leg 01/11/2016  . Piriformis syndrome of  right side 12/29/2015  . Leg length discrepancy 12/29/2015  . Anxiety with depression 06/24/2014  . Nicotine dependence 06/24/2014    Social History   Tobacco Use  . Smoking status: Current Every Day Smoker    Packs/day: 0.50    Years: 33.00    Pack years: 16.50    Types: Cigarettes, E-cigarettes  . Smokeless tobacco: Never Used  . Tobacco comment: taking chantix  Substance Use Topics  . Alcohol use: No    Comment: per pt recovering alcoholic in remission since 04-25-2016    Current Outpatient Medications:  .  budesonide (PULMICORT) 180 MCG/ACT inhaler, Inhale 1 puff into the lungs 2 (two) times daily., Disp: 1 Inhaler, Rfl: 3 .  carvedilol (COREG) 6.25 MG tablet, Take 1 tablet (6.25 mg total) by mouth 2 (two) times daily., Disp: 60 tablet, Rfl: 3 .  cilostazol (PLETAL) 50 MG tablet, Take 1 tablet (50 mg total) by mouth 2 (two) times daily., Disp: 180 tablet, Rfl: 3 .  clonazePAM (KLONOPIN) 1 MG tablet, Take 1 tablet (1 mg total) by mouth 2 (two) times daily as needed for anxiety., Disp: 60 tablet, Rfl: 0 .  esomeprazole (NEXIUM) 20 MG capsule, Take 20 mg by mouth daily at 12 noon., Disp: , Rfl:  .  FLUoxetine (PROZAC) 20 MG tablet, Take 1 tablet (20 mg total) by mouth daily. (Patient not taking: Reported on 08/13/2018), Disp: 90 tablet, Rfl: 3 .  fluticasone (FLOVENT HFA) 220 MCG/ACT inhaler, Inhale 2 puffs into the lungs 2 (two) times daily., Disp: 3 Inhaler, Rfl: 4 .  gabapentin (NEURONTIN) 600 MG tablet, Take 1 tablet (600 mg total) by mouth 3 (three) times daily., Disp: 270 tablet, Rfl: 3 .  mexiletine (MEXITIL) 150 MG capsule, Take 1 capsule (150 mg total) by mouth 2 (two) times daily., Disp: 60 capsule, Rfl: 3 .  oxyCODONE-acetaminophen (PERCOCET) 10-325 MG tablet, Take 1 tablet by mouth every 8 (eight) hours as needed for up to 30 days for  pain., Disp: 90 tablet, Rfl: 0 .  prednisoLONE 5 MG (21) TBPK, Take 5 mg by mouth as directed. 6-5-4-3-2-1, Disp: 21 each, Rfl: 0 .  rosuvastatin (CRESTOR) 5 MG tablet, Take 1 tablet (5 mg total) by mouth daily., Disp: 90 tablet, Rfl: 2 .  sildenafil (VIAGRA) 25 MG tablet, 2-3 tabs po x 1. Take 30 minutes to 4 hours prior to sexual activity., Disp: 21 tablet, Rfl: 2 .  varenicline (CHANTIX CONTINUING MONTH PAK) 1 MG tablet, Take 1 tablet (1 mg total) by mouth 2 (two) times daily., Disp: 270 tablet, Rfl: 0 .  varenicline (CHANTIX STARTING MONTH PAK) 0.5 MG X 11 & 1 MG X 42 tablet, Take one 0.5 mg tablet by mouth once daily for 3 days, then increase to one 0.5 mg tablet twice daily for 4 days, then increase to one 1 mg tablet twice daily., Disp: 53 tablet, Rfl: 0 .  warfarin (COUMADIN) 1 MG tablet, Take 7 tablets (7 mg total) by mouth every Tuesday., Disp: , Rfl:  .  warfarin (COUMADIN) 6 MG tablet, Take 1 tablet (6 mg total) by mouth daily., Disp: , Rfl:   Allergies  Allergen Reactions  . Cymbalta [Duloxetine Hcl] Other (See Comments)    Headaches    Objective:   VITALS: Per patient if applicable, see vitals. GENERAL: Alert, appears well and in no acute distress. HEENT: Atraumatic, conjunctiva clear, no obvious abnormalities on inspection of external nose and ears. NECK: Normal movements of the head and neck. CARDIOPULMONARY: No  increased WOB. Speaking in clear sentences. I:E ratio WNL.  MS: Moves all visible extremities without noticeable abnormality. PSYCH: Pleasant and cooperative, well-groomed. Speech normal rate and rhythm. Affect is appropriate. Insight and judgement are appropriate. Attention is focused, linear, and appropriate.  NEURO: CN grossly intact. Oriented as arrived to appointment on time with no prompting. Moves both UE equally.  SKIN: No obvious lesions, wounds, erythema, or cyanosis noted on face or hands.  Depression screen [facility] 2/9 10/25/2018 05/10/2018 07/08/2016    Decreased Interest 0 2 0  Down, Depressed, Hopeless 0 1 0  PHQ - 2 Score 0 3 0  Altered sleeping - 1 -  Tired, decreased energy 0 1 -  Change in appetite 0 2 -  Feeling bad or failure about yourself  0 0 -  Trouble concentrating 0 0 -  Moving slowly or fidgety/restless 0 0 -  Suicidal thoughts 0 0 -  PHQ-9 Score - 7 -  Difficult doing work/chores Not difficult at all Somewhat difficult -  Some recent data might be hidden    Assessment and Plan:   Col[place] was seen today for follow-up.  Diagnoses and all orders for this visit:  Chronic right-sided low back pain with right-sided sciatica  Chronic pain disorder, cervical and lumbar spine, chronic opioid with pain contract in place, Dr. Briscoe Deutscher -     oxyCODONE-acetaminophen (PERCOCET) 10-325 MG tablet; Take 1 tablet by mouth 4 (four) times daily for 30 days. -     Ambulatory referral to Interventional Radiology    . COVID-19 Education: The signs and symptoms of COVID-19 were discussed with the patient and how to seek care for testing if needed. The importance of social distancing was discussed today. . Reviewed expectations re: course of current medical issues. . Discussed self-management of symptoms. . Outlined signs and symptoms indicating need for more acute intervention. . Patient verbalized understanding and all questions were answered. Marland Kitchen Health Maintenance issues including appropriate healthy diet, exercise, and smoking avoidance were discussed with patient. . See orders for this visit as documented in the electronic medical record.  Briscoe Deutscher, DO  Records requested if needed. Time spent: 25 minutes, of which >50% was spent in obtaining information about his symptoms, reviewing his previous labs, evaluations, and treatments, counseling him about his condition (please see the discussed topics above), and developing a plan to further investigate it; he had a number of questions which I addressed.

## 2018-10-25 ENCOUNTER — Other Ambulatory Visit: Payer: Self-pay

## 2018-10-25 ENCOUNTER — Encounter: Payer: Self-pay | Admitting: Family Medicine

## 2018-10-25 ENCOUNTER — Ambulatory Visit (INDEPENDENT_AMBULATORY_CARE_PROVIDER_SITE_OTHER): Payer: 59 | Admitting: Family Medicine

## 2018-10-25 VITALS — Ht 69.0 in | Wt 177.0 lb

## 2018-10-25 DIAGNOSIS — G894 Chronic pain syndrome: Secondary | ICD-10-CM

## 2018-10-25 DIAGNOSIS — G8929 Other chronic pain: Secondary | ICD-10-CM | POA: Diagnosis not present

## 2018-10-25 DIAGNOSIS — M5441 Lumbago with sciatica, right side: Secondary | ICD-10-CM

## 2018-10-25 DIAGNOSIS — I70211 Atherosclerosis of native arteries of extremities with intermittent claudication, right leg: Secondary | ICD-10-CM

## 2018-10-27 ENCOUNTER — Encounter: Payer: Self-pay | Admitting: Family Medicine

## 2018-10-27 MED ORDER — OXYCODONE-ACETAMINOPHEN 10-325 MG PO TABS: 1.0000 | ORAL_TABLET | Freq: Four times a day (QID) | ORAL | 0 refills | Status: DC

## 2018-10-28 MED FILL — OXYCODONE-APAP 10-325: 10-325 | 30 days supply | Qty: 120 | Fill #0

## 2018-11-04 ENCOUNTER — Other Ambulatory Visit: Payer: Self-pay

## 2018-11-04 ENCOUNTER — Encounter: Payer: Self-pay | Admitting: Family Medicine

## 2018-11-04 DIAGNOSIS — I745 Embolism and thrombosis of iliac artery: Secondary | ICD-10-CM

## 2018-11-04 DIAGNOSIS — I70211 Atherosclerosis of native arteries of extremities with intermittent claudication, right leg: Secondary | ICD-10-CM

## 2018-11-04 MED FILL — FLUOXETINE HCL 20 MG TABS: 20 | 90 days supply | Qty: 90 | Fill #1

## 2018-11-05 ENCOUNTER — Telehealth: Payer: Self-pay | Admitting: *Deleted

## 2018-11-05 ENCOUNTER — Other Ambulatory Visit: Payer: Self-pay

## 2018-11-05 DIAGNOSIS — G8929 Other chronic pain: Secondary | ICD-10-CM

## 2018-11-05 DIAGNOSIS — M5441 Lumbago with sciatica, right side: Secondary | ICD-10-CM

## 2018-11-05 NOTE — Telephone Encounter (Signed)

## 2018-11-07 ENCOUNTER — Encounter (HOSPITAL_COMMUNITY): Payer: Medicare Other

## 2018-11-07 ENCOUNTER — Ambulatory Visit: Payer: Medicare Other | Admitting: Family

## 2018-11-07 ENCOUNTER — Encounter (HOSPITAL_COMMUNITY): Payer: 59

## 2018-11-12 ENCOUNTER — Encounter: Payer: 59 | Admitting: Cardiology

## 2018-11-12 ENCOUNTER — Other Ambulatory Visit: Payer: Self-pay

## 2018-11-12 NOTE — Progress Notes (Signed)
This encounter was created in error - please disregard.

## 2018-11-13 ENCOUNTER — Ambulatory Visit (HOSPITAL_COMMUNITY)
Admission: RE | Admit: 2018-11-13 | Discharge: 2018-11-13 | Disposition: A | Discharge status: Home / Self Care | Payer: 59 | Source: Ambulatory Visit | Attending: Family | Admitting: Family

## 2018-11-13 ENCOUNTER — Other Ambulatory Visit: Payer: Self-pay

## 2018-11-13 ENCOUNTER — Encounter: Payer: Self-pay | Admitting: Family

## 2018-11-13 ENCOUNTER — Ambulatory Visit (INDEPENDENT_AMBULATORY_CARE_PROVIDER_SITE_OTHER)
Admission: RE | Admit: 2018-11-13 | Discharge: 2018-11-13 | Disposition: A | Discharge status: Home / Self Care | Payer: 59 | Source: Ambulatory Visit | Attending: Family | Admitting: Family

## 2018-11-13 ENCOUNTER — Ambulatory Visit (INDEPENDENT_AMBULATORY_CARE_PROVIDER_SITE_OTHER): Payer: 59 | Admitting: Family

## 2018-11-13 VITALS — BP 126/73 | HR 79 | Temp 98.3°F | Resp 12 | Ht 69.0 in | Wt 157.8 lb

## 2018-11-13 DIAGNOSIS — I745 Embolism and thrombosis of iliac artery: Secondary | ICD-10-CM | POA: Diagnosis not present

## 2018-11-13 DIAGNOSIS — D6859 Other primary thrombophilia: Secondary | ICD-10-CM

## 2018-11-13 DIAGNOSIS — I499 Cardiac arrhythmia, unspecified: Secondary | ICD-10-CM

## 2018-11-13 DIAGNOSIS — Z7901 Long term (current) use of anticoagulants: Secondary | ICD-10-CM | POA: Diagnosis not present

## 2018-11-13 DIAGNOSIS — I70211 Atherosclerosis of native arteries of extremities with intermittent claudication, right leg: Secondary | ICD-10-CM | POA: Insufficient documentation

## 2018-11-13 DIAGNOSIS — I779 Disorder of arteries and arterioles, unspecified: Secondary | ICD-10-CM

## 2018-11-13 NOTE — Patient Instructions (Addendum)
Steps to Quit Smoking  Smoking tobacco can be bad for your health. It can also affect almost every organ in your body. Smoking puts you and people around you at risk for many serious long-lasting (chronic) diseases. Quitting smoking is hard, but it is one of the best things that you can do for your health. It is never too late to quit. What are the benefits of quitting smoking? When you quit smoking, you lower your risk for getting serious diseases and conditions. They can include:  Lung cancer or lung disease.  Heart disease.  Stroke.  Heart attack.  Not being able to have children (infertility).  Weak bones (osteoporosis) and broken bones (fractures). If you have coughing, wheezing, and shortness of breath, those symptoms may get better when you quit. You may also get sick less often. If you are pregnant, quitting smoking can help to lower your chances of having a baby of low birth weight. What can I do to help me quit smoking? Talk with your doctor about what can help you quit smoking. Some things you can do (strategies) include:  Quitting smoking totally, instead of slowly cutting back how much you smoke over a period of time.  Going to in-person counseling. You are more likely to quit if you go to many counseling sessions.  Using resources and support systems, such as: ? Online chats with a counselor. ? Phone quitlines. ? Printed self-help materials. ? Support groups or group counseling. ? Text messaging programs. ? Mobile phone apps or applications.  Taking medicines. Some of these medicines may have nicotine in them. If you are pregnant or breastfeeding, do not take any medicines to quit smoking unless your doctor says it is okay. Talk with your doctor about counseling or other things that can help you. Talk with your doctor about using more than one strategy at the same time, such as taking medicines while you are also going to in-person counseling. This can help make  quitting easier. What things can I do to make it easier to quit? Quitting smoking might feel very hard at first, but there is a lot that you can do to make it easier. Take these steps:  Talk to your family and friends. Ask them to support and encourage you.  Call phone quitlines, reach out to support groups, or work with a counselor.  Ask people who smoke to not smoke around you.  Avoid places that make you want (trigger) to smoke, such as: ? Bars. ? Parties. ? Smoke-break areas at work.  Spend time with people who do not smoke.  Lower the stress in your life. Stress can make you want to smoke. Try these things to help your stress: ? Getting regular exercise. ? Deep-breathing exercises. ? Yoga. ? Meditating. ? Doing a body scan. To do this, close your eyes, focus on one area of your body at a time from head to toe, and notice which parts of your body are tense. Try to relax the muscles in those areas.  Download or buy apps on your mobile phone or tablet that can help you stick to your quit plan. There are many free apps, such as QuitGuide from the CDC (Centers for Disease Control and Prevention). You can find more support from smokefree.gov and other websites. This information is not intended to replace advice given to you by your health care provider. Make sure you discuss any questions you have with your health care provider. Document Released: 03/04/2009 Document Revised: 01/04/2016   Document Reviewed: 09/22/2014 Elsevier Interactive Patient Education  2019 [place] Need to Know About Electronic Cigarettes Electronic cigarettes, or e-cigarettes, are battery-operated devices that deliver nicotine to your body. They come in many shapes, including in the shape of a cigarette, pipe, pen, and even a USB memory stick. E-cigarettes have a cartridge that contains a liquid form of nicotine. When you use the device, the liquid heats up. It then becomes a vapor.  Inhaling this vapor is called vaping. While e-cigarettes do not contain tar and the same cancer-causing chemicals that are in tobacco cigarettes, they may contain other harmful and cancer-causing chemicals, such as formaldehyde or acetaldehyde. Nicotine is thought to increase your risk for certain types of cancer. Many e-cigarettes have chemical colorings and flavorings. It is not clear how much nicotine you get when vaping. The health effects of vaping are not completely known. Some people may use e-cigarettes in order to quit smoking tobacco. However, this has not been proven to work, and the Transport planner (FDA) has not approved e-cigarettes for this purpose. How can using electronic cigarettes affect me?  E-cigarettes contain nicotine, which is a very addictive drug. Vaping may make you crave nicotine. Nicotine: ? Changes your blood sugar levels. ? Increases your heart rate, blood pressure, and breathing rate. ? Increases your risk of developing blood clots (hypercoagulable state) and diabetes.  If you smoke e-cigarettes, you may be more likely to start smoking or to smoke more tobacco cigarettes.  Becoming addicted to nicotine may make your brain more sensitive to other addictive drugs. You may move to other addictive substances.  If you are pregnant, the nicotine in e-cigarettes may be harmful to your baby. Nicotine can cause: ? Brain or lung problems for your baby. ? Your baby to be born too early. ? Your baby to be born with a low birth weight.  If you are a child or a teen, vaping may affect your memory or lower your attention span.  You may be in danger of overdosing on nicotine. Nicotine poisoning can cause nausea, vomiting, seizures, and trouble breathing. What are the benefits of stopping vaping? If you stop vaping, you can avoid:  Getting addicted to nicotine.  Having nicotine side effects.  Getting nicotine poisoning.  Being exposed to dangerous chemicals.   Increasing your risk of health problems.  Increasing your baby's risk of health problems, if you are pregnant.  Being more likely to use other addictive substances. What steps can I take to stop vaping? If you can stop vaping on your own, do it before you become addicted to nicotine. If you need help stopping, ask your health care provider. There are three effective ways to fight nicotine addiction:  Nicotine replacement therapy. Using nicotine gum or a nicotine patch blocks your craving for nicotine. Over time, you can reduce the amount of nicotine you use until you can stop using nicotine completely without having cravings.  Prescription medicines approved to fight nicotine addiction. These stop nicotine cravings or block the effects of nicotine.  Behavioral therapy. This may include: ? A self-help smoking cessation program. ? Individual or group therapy. ? A smoking cessation support group.  Where can I get support? You can get support at these sites:  [place] [employer]: MusicTeasers.nl  U.S. Department of Health and Human Services: https://smokefree.gov  American Lung Association: WealthToys.de Where can I get more information? Learn more about e-cigarettes from:  [place]  Institutes of Health: PicCapture.uy  U.S. Department of Health and Human Services: StoreMirror.com.cy Summary  E-cigarettes can cause nicotine addiction.  E-cigarettes are not approved as a way to stop smoking.  E-cigarettes are not a risk-free alternative to smoking tobacco.  There are ways to fight nicotine addiction.  Talk to your health care provider if you are unable to stop vaping on your own. This information is not intended to replace advice given to you by your  health care provider. Make sure you discuss any questions you have with your health care provider. Document Released: 08/30/2015 Document Revised: 01/26/2016 Document Reviewed: 04/30/2015 Elsevier Interactive Patient Education  2019 [place].     Peripheral Vascular Disease  Peripheral vascular disease (PVD) is a disease of the blood vessels that are not part of your heart and brain. A simple term for PVD is poor circulation. In most cases, PVD narrows the blood vessels that carry blood from your heart to the rest of your body. This can reduce the supply of blood to your arms, legs, and internal organs, like your stomach or kidneys. However, PVD most often affects a person's lower legs and feet. Without treatment, PVD tends to get worse. PVD can also lead to acute ischemic limb. This is when an arm or leg suddenly cannot get enough blood. This is a medical emergency. Follow these instructions at home: Lifestyle  Do not use any products that contain nicotine or tobacco, such as cigarettes and e-cigarettes. If you need help quitting, ask your doctor.  Lose weight if you are overweight. Or, stay at a healthy weight as told by your doctor.  Eat a diet that is low in fat and cholesterol. If you need help, ask your doctor.  Exercise regularly. Ask your doctor for activities that are right for you. General instructions  Take over-the-counter and prescription medicines only as told by your doctor.  Take good care of your feet: ? Wear comfortable shoes that fit well. ? Check your feet often for any cuts or sores.  Keep all follow-up visits as told by your doctor This is important. Contact a doctor if:  You have cramps in your legs when you walk.  You have leg pain when you are at rest.  You have coldness in a leg or foot.  Your skin changes.  You are unable to get or have an erection (erectile dysfunction).  You have cuts or sores on your feet that do not heal. Get help right  away if:  Your arm or leg turns cold, numb, and blue.  Your arms or legs become red, warm, swollen, painful, or numb.  You have chest pain.  You have trouble breathing.  You suddenly have weakness in your face, arm, or leg.  You become very confused or you cannot speak.  You suddenly have a very bad headache.  You suddenly cannot see. Summary  Peripheral vascular disease (PVD) is a disease of the blood vessels.  A simple term for PVD is poor circulation. Without treatment, PVD tends to get worse.  Treatment may include exercise, low fat and low cholesterol diet, and quitting smoking. This information is not intended to replace advice given to you by your health care provider. Make sure you discuss any questions you have with your health care provider. Document Released: 08/02/2009 Document Revised: 06/15/2016 Document Reviewed: 06/15/2016 Elsevier Interactive Patient Education  2019 [employer].

## 2018-11-13 NOTE — Progress Notes (Signed)
[facility] [place]   CC: Follow up peripheral artery occlusive disease  History of Present Illness Domanic Matusek is a 53 y.o. male who is s/p R CIA PTA+S on 02/24/16 by Dr. Bridgett Larsson.   He has mild neuropathic pain in his right leg   Work-up on spine reveals mild disc bulges with minimal impingement.  Patient has had epidural injections.    Dr. Bridgett Larsson last evaluated pt on 10-17-17. At that time pt had right leg intermittent claudication without evidence of critical limb ischemia. Dr. Bridgett Larsson offered the patient: annual BLE ABI and aortoiliac duplex.  Pt returns today for follow up.   He is still taking Pletal and he thinks this and gabapentin helps with his walking distance. He walks 2 miles twice per week.  He has no claudication symptoms after walking 2 miles, he does develop tingling and numbness in his feet with walking.    He denies any history of stroke or TIA.    Diabetic: No Tobacco use: smoker  (1/2 ppd, started in his teens)  Pt meds include: Statin :Yes Betablocker: Yes ASA: No Other anticoagulants/antiplatelets: warfarin, hx of atrial fib and hypercoagulable state (APLS)   Past Medical History:  Diagnosis Date  . Anticoagulated on Coumadin   . Antiphospholipid antibody positive 06/27/2016   followed by dr kale ([facility])  . Anxiety with depression 06/24/2014  . Chronic back pain   . Dysrhythmia    PVCs  . ED (erectile dysfunction)   . GERD (gastroesophageal reflux disease)   . Hemorrhoids, internal, with bleeding   . Hiatal hernia   . History of adenomatous polyp of colon    07-08-2016  tubular adenoma's  . History of atrial fibrillation    episode 04/ 2018  . History of DVT of lower extremity   . Hyperlipidemia   . Internal hemorrhoid 11/19/2016  . Iron deficiency anemia due to chronic blood loss followed by dr Irene Limbo ([facility])   from hemorrhoids and on coumadin-- hx Iron infusion's and blood transfusion's x2 07-09-2016  .  Neuropathy, peripheral    right foot numbness from lumbar pinched nerve  . OA (osteoarthritis)   . PAD (peripheral artery disease) ([place]) followed by dr Bridgett Larsson--  per LOV note ABI 11-14-2016  widely patent stent/  11-27-2016 per pt no claudication symptoms   hx right CIA angioplasty and stent 02-24-2016/  right CIA thromectomy and patch angioplasty for restenosis 07-18-2015  . Primary hypercoagulable state ([place]) [D68.59] 09/15/2016  . Recovering alcoholic in remission ([place])    per pt since 04-25-2016  . S/P insertion of iliac artery stent    02-23-2017  right CIA PTA and stent/  05-11-2016  in-stent restenosis  s/p  thrombectomy/  07-17-2016 thrombectomy and  patch angioplasty right femoral artery   . Smokers' cough ([place])   . Wears glasses     Social History Social History   Tobacco Use  . Smoking status: Current Every Day Smoker    Packs/day: 0.50    Years: 33.00    Pack years: 16.50    Types: Cigarettes, E-cigarettes  . Smokeless tobacco: Never Used  . Tobacco comment: taking chantix  Substance Use Topics  . Alcohol use: No    Comment: per pt recovering alcoholic in remission since 04-25-2016  . Drug use: No    Family History Family History  Problem Relation Age of Onset  . Cancer Father        Lung    Past Surgical  History:  Procedure Laterality Date  . ANGIOPLASTY ILLIAC ARTERY Right 07/17/2016   Procedure: ANGIOPLASTY RIGHT COMMON ILIAC ARTERY;  Surgeon: Conrad [place], MD;  Location: [facility];  Service: Vascular;  Laterality: Right;  . ANTERIOR CERVICAL DECOMP/DISCECTOMY FUSION  07/21/2010   C5 -- C7  . CARDIOVASCULAR STRESS TEST  06-14-2016   dr hilty   normal perfusion study w/ no reversible ischemia/  stress ef 44% but visually looks better , echo ordered (LVEF 30-44%)  , normal LV wall motion   . COLONOSCOPY N/A 07/08/2016   Procedure: COLONOSCOPY;  Surgeon: Clarene Essex, MD;  Location: [facility];  Service: Endoscopy;  Laterality: N/A;  . ESOPHAGOGASTRODUODENOSCOPY  N/A 07/08/2016   Procedure: ESOPHAGOGASTRODUODENOSCOPY (EGD);  Surgeon: Clarene Essex, MD;  Location: [facility];  Service: Endoscopy;  Laterality: N/A;  . GROIN DEBRIDEMENT Right 07/25/2016   Procedure: EVACUATION HEMATOMA RIGHT GROIN;  Surgeon: Waynetta Sandy, MD;  Location: [facility];  Service: Vascular;  Laterality: Right;  . HEMORRHOID SURGERY  2013  approx.   ?lanced/ banding  . HEMORRHOID SURGERY N/A 12/07/2016   Procedure: 3 COLUMN HEMORRHOIDECTOMY;  Surgeon: Leighton Ruff, MD;  [facility];  Service: General;  Laterality: N/A;  . INTRAOPERATIVE ARTERIOGRAM Right 07/17/2016   Procedure: INTRA OPERATIVE ANGIOGRAM OF RIGHT COMMON ILIAC ARTERY;  Surgeon: Conrad [place], MD;  Location: [facility];  Service: Vascular;  Laterality: Right;  . KNEE SURGERY Right 1983   cartilage  . PATCH ANGIOPLASTY Right 07/17/2016   Procedure: PATCH ANGIOPLASTY RIGHT FEMORAL ARTERY USING Rueben Bash BIOLOGIC PATCH;  Surgeon: Conrad Greene, MD;  Location: [facility];  Service: Vascular;  Laterality: Right;  . PERIPHERAL VASCULAR CATHETERIZATION N/A 02/24/2016   Procedure: Abdominal Aortogram w/Lower Extremity;  Surgeon: Conrad [place], MD;  Location: [facility];  Service: Cardiovascular;  Laterality: N/A;  . PERIPHERAL VASCULAR CATHETERIZATION Right 02/24/2016   Procedure: Peripheral Vascular Intervention;  Surgeon: Conrad [place], MD;  Location: [facility];  Service: Cardiovascular;  Laterality: Right;  Common iliac  . PERIPHERAL VASCULAR CATHETERIZATION N/A 05/11/2016   Procedure: Abdominal Aortogram;  Surgeon: Conrad [place], MD;  Location: [facility];  Service: Cardiovascular;  Laterality: N/A;  . PERIPHERAL VASCULAR CATHETERIZATION N/A 05/11/2016   Procedure: Lower Extremity Angiography;  Surgeon: Conrad [place], MD;  Location: [facility];  Service: Cardiovascular;  Laterality: N/A;  . POSTERIOR CERVICAL FUSION/FORAMINOTOMY N/A 07/05/2018   Procedure: Posterior cervical  fusion with lateral mass fixation - Cervical five-Cervical six, Cervical six-Cervical seven;  Surgeon: Earnie Larsson, MD;  Location: [facility];  Service: Neurosurgery;  Laterality: N/A;  . THROMBECTOMY FEMORAL ARTERY Right 07/17/2016   Procedure: THROMBECTOMY RIGHT FEMORAL ARTERY;  Surgeon: Conrad Fulton, MD;  Location: [facility];  Service: Vascular;  Laterality: Right;  . TRANSTHORACIC ECHOCARDIOGRAM  06-20-2016   dr hilty   ef 55-60%,  grade 1 diastolic dysfunction/  mild TR    Allergies  Allergen Reactions  . Cymbalta [Duloxetine Hcl] Other (See Comments)    Headaches    Current Outpatient Medications  Medication Sig Dispense Refill  . carvedilol (COREG) 6.25 MG tablet Take 1 tablet (6.25 mg total) by mouth 2 (two) times daily. 60 tablet 3  . cilostazol (PLETAL) 50 MG tablet Take 1 tablet (50 mg total) by mouth 2 (two) times daily. 180 tablet 3  . esomeprazole (NEXIUM) 20 MG capsule Take 20 mg by mouth daily at 12 noon.    Marland Kitchen FLUoxetine (PROZAC)  20 MG tablet Take 1 tablet (20 mg total) by mouth daily. 90 tablet 3  . fluticasone (FLOVENT HFA) 220 MCG/ACT inhaler Inhale 2 puffs into the lungs 2 (two) times daily. 3 Inhaler 4  . gabapentin (NEURONTIN) 600 MG tablet Take 1 tablet (600 mg total) by mouth 3 (three) times daily. 270 tablet 3  . mexiletine (MEXITIL) 150 MG capsule Take 1 capsule (150 mg total) by mouth 2 (two) times daily. 60 capsule 3  . oxyCODONE-acetaminophen (PERCOCET) 10-325 MG tablet Take 1 tablet by mouth 4 (four) times daily for 30 days. 120 tablet 0  . rosuvastatin (CRESTOR) 5 MG tablet Take 1 tablet (5 mg total) by mouth daily. 90 tablet 2  . sildenafil (VIAGRA) 25 MG tablet 2-3 tabs po x 1. Take 30 minutes to 4 hours prior to sexual activity. 21 tablet 2  . warfarin (COUMADIN) 1 MG tablet Take 7 tablets (7 mg total) by mouth every Tuesday.    . warfarin (COUMADIN) 6 MG tablet Take 1 tablet (6 mg total) by mouth daily.     No current facility-administered medications for this  visit.     ROS: See HPI for pertinent positives and negatives.   Physical Examination  Vitals:   11/13/18 0956 11/13/18 0958  BP: 126/78 126/73  Pulse: 79   Resp: 12   Temp: 98.3 F (36.8 C)   TempSrc: Temporal   SpO2: 99%   Weight: 157 lb 12.8 oz (71.6 kg)   Height: 5\' 9"  (1.753 m)    Body mass index is 23.3 kg/m.  General: A&O x 3, WDWN, male. Gait: normal HENT: No gross abnormalities.  Eyes: PERRLA. Pulmonary: Respirations are non labored, CTAB, fair air movement in all fields Cardiac: Irregular rhythm with controlled rate, no detected murmur.         Carotid Bruits Right Left   Negative Negative   Radial pulses are 2+ palpable bilaterally   Adominal aortic pulse is not palpable                         VASCULAR EXAM: Extremities without ischemic changes, without Gangrene; without open wounds.                                                                                                          LE Pulses Right Left       FEMORAL  2+ palpable  2+ palpable        POPLITEAL  2+ palpable   1+ palpable       POSTERIOR TIBIAL  2+ palpable   2+ palpable        DORSALIS PEDIS      ANTERIOR TIBIAL 1+ palpable  2+ palpable    Abdomen: soft, NT, no palpable masses. Skin: no rashes, no cellulitis, no ulcers noted. Musculoskeletal: no muscle wasting or atrophy.  Neurologic: A&O X 3; appropriate affect, Sensation is normal; MOTOR FUNCTION:  moving all extremities equally, motor strength 5/5 throughout. Speech is fluent/normal. CN 2-12 intact. Psychiatric: Thought content is normal,  mood appropriate for clinical situation.     ASSESSMENT: [name] is a 53 y.o. male who is s/p R CIA PTA+S on 02/24/16 by Dr. Bridgett Larsson.   He has no claudication sx's with walking; he does have tingling and numbness in his feet after walking some distance. He has known HNP in his spine, has had ESI's.  There are no signs of ischemia in his feet or legs.  Pedal pulses are palpable  bilaterally.   I discussed with Dr. Scot Dock the low 300's PSV stenoses in the right iliac artery stent, with bilateral ABI's remaining normal, all triphasic waveforms, decline in bilateral TBI's, but still in normal range. See Plan.   DATA  11-13-18 Abdominal Aorta Findings: +-------------+-------+----------+----------+---------+--------+--------+ Location     AP (cm)Trans (cm)PSV (cm/s)Waveform ThrombusComments +-------------+-------+----------+----------+---------+--------+--------+ Proximal                      90        triphasic                 +-------------+-------+----------+----------+---------+--------+--------+ Mid                           77        triphasic                 +-------------+-------+----------+----------+---------+--------+--------+ Distal                        101       triphasic                 +-------------+-------+----------+----------+---------+--------+--------+ RT CIA Prox                                              stent    +-------------+-------+----------+----------+---------+--------+--------+ RT CIA Mid                                               stent    +-------------+-------+----------+----------+---------+--------+--------+ RT CIA Distal                                            stent    +-------------+-------+----------+----------+---------+--------+--------+ RT EIA Prox                   316       biphasic         plaque   +-------------+-------+----------+----------+---------+--------+--------+ RT EIA Mid                    165       biphasic                  +-------------+-------+----------+----------+---------+--------+--------+ RT EIA Distal                 86        biphasic                  +-------------+-------+----------+----------+---------+--------+--------+ LT CIA Prox  191       triphasic                  +-------------+-------+----------+----------+---------+--------+--------+ LT CIA Mid                    189       triphasic        plaque   +-------------+-------+----------+----------+---------+--------+--------+ LT CIA Distal                 218       biphasic                  +-------------+-------+----------+----------+---------+--------+--------+ LT EIA Prox                   159       biphasic                  +-------------+-------+----------+----------+---------+--------+--------+ LT EIA Mid                    80        biphasic                  +-------------+-------+----------+----------+---------+--------+--------+ LT EIA Distal                 81        biphasic                  +-------------+-------+----------+----------+---------+--------+--------+    Right Stent(s): +-------------------+--------+---------------+---------+--------+ common iliac arteryPSV cm/sStenosis       Waveform Comments +-------------------+--------+---------------+---------+--------+ Prox to Stent      120                    triphasic         +-------------------+--------+---------------+---------+--------+ Proximal Stent     200                    triphasic         +-------------------+--------+---------------+---------+--------+ Mid Stent          158                    triphasic         +-------------------+--------+---------------+---------+--------+ Distal Stent       228     50-99% stenosistriphasic         +-------------------+--------+---------------+---------+--------+ Distal to Stent    307     50-99% stenosisbiphasic          +-------------------+--------+---------------+---------+--------+     Summary: Stenosis: +--------------------+-------------+---------------+--------------+ Location            Stenosis     Stent          Comments        +--------------------+-------------+---------------+--------------+ Right Common Iliac               50-99% stenosisoutflow artery +--------------------+-------------+---------------+--------------+ Right External Iliac>50% stenosis               proximal       +--------------------+-------------+---------------+--------------+ Patent right common iliac stent with elevated velocities noted in the distal stent and outflow artery.  Plaque visualized in the right proximal external iliac artery, and the left mid common iliac artery.  Increased stenosis in the right iliac arteries compared to the exam on 10-17-17.    ABI (Date: 11/13/2018): ABI Findings: +---------+------------------+-----+---------+--------+ Right    Rt Pressure (mmHg)IndexWaveform Comment  +---------+------------------+-----+---------+--------+ Brachial 118                                      +---------+------------------+-----+---------+--------+  PTA      133               1.13 triphasic         +---------+------------------+-----+---------+--------+ DP       125               1.06 triphasic         +---------+------------------+-----+---------+--------+ Great Toe84                0.71 Abnormal          +---------+------------------+-----+---------+--------+  +---------+------------------+-----+---------+-------+ Left     Lt Pressure (mmHg)IndexWaveform Comment +---------+------------------+-----+---------+-------+ Brachial 110                                     +---------+------------------+-----+---------+-------+ PTA      129               1.09 triphasic        +---------+------------------+-----+---------+-------+ DP       144               1.22 triphasic        +---------+------------------+-----+---------+-------+ Great Toe100               0.85                   +---------+------------------+-----+---------+-------+  +-------+-----------+-----------+------------+------------+ ABI/TBIToday's ABIToday's TBIPrevious ABIPrevious TBI +-------+-----------+-----------+------------+------------+ Right  1.13       0.71       1.29        1.01         +-------+-----------+-----------+------------+------------+ Left   1.22       0.85       1.30        1.13         +-------+-----------+-----------+------------+------------+  Bilateral ABIs and TBIs appear decreased compared to prior study on 10/17/2017. However, indices are within normal ranges bilaterally.   Summary: Right: Resting right ankle-brachial index is within normal range. No evidence of significant right lower extremity arterial disease. The right toe-brachial index is normal. RT great toe pressure = 84 mmHg. PPG tracings appear dampened.  Left: Resting left ankle-brachial index is within normal range. No evidence of significant left lower extremity arterial disease. The left toe-brachial index is normal. LT Great toe pressure = 100 mmHg. PPG tracings appear dampened.     PLAN:  Based on the patient's vascular studies and examination, pt will return to clinic in 6 months with ABI's and bilateral aortoiliac duplex. Gradually increase the frequency of walking, in a safe environment.   I advised him to notify us if he develops concerns re the circulation in his feet or legs.   Over 3 minutes was spent counseling patient re smoking cessation, and patient was given several free resources re smoking cessation.  I discussed in depth with the patient the nature of atherosclerosis, and emphasized the importance of maximal medical management including strict control of blood pressure, blood glucose, and lipid levels, obtaining regular exercise, and cessation of smoking.  The patient is aware that without maximal medical management the underlying atherosclerotic disease process will  progress, limiting the benefit of any interventions.  The patient was given information about PAD including signs, symptoms, treatment, what symptoms should prompt the patient to seek immediate medical care, and risk reduction measures to take.  Vinnie Level Brodan Grewell, RN, MSN, FNP-C [facility] Phone:  [phone]  Clinic MD: Laqueta Due  11/13/18 10:05 AM

## 2018-11-18 ENCOUNTER — Other Ambulatory Visit: Payer: Self-pay | Admitting: Family Medicine

## 2018-11-18 DIAGNOSIS — G894 Chronic pain syndrome: Secondary | ICD-10-CM

## 2018-11-18 MED ORDER — OXYCODONE-ACETAMINOPHEN 10-325 MG PO TABS: 1.0000 | ORAL_TABLET | Freq: Four times a day (QID) | ORAL | 0 refills | Status: DC

## 2018-11-18 NOTE — Telephone Encounter (Signed)
Requesting refill Last OV 10/25/2018, Last Rx 10/27/2018, # 120, refill 0.

## 2018-11-20 ENCOUNTER — Encounter: Payer: Self-pay | Admitting: Family Medicine

## 2018-11-20 ENCOUNTER — Other Ambulatory Visit: Payer: Self-pay | Admitting: *Deleted

## 2018-11-20 DIAGNOSIS — G8929 Other chronic pain: Secondary | ICD-10-CM

## 2018-11-25 MED FILL — MEXILETINE 150 MG CAPSULE: 150 | 30 days supply | Qty: 60 | Fill #2

## 2018-11-25 MED FILL — OXYCODONE-APAP 10-325: 10-325 | 30 days supply | Qty: 120 | Fill #0

## 2018-11-26 ENCOUNTER — Telehealth: Payer: Self-pay

## 2018-11-26 NOTE — Telephone Encounter (Signed)
Patient will be holding coumadin for 4 days for [facility]. Will need to get Lovenox instructions. Marland Kitchen

## 2018-11-27 ENCOUNTER — Ambulatory Visit: Payer: Medicare Other

## 2018-11-27 NOTE — Telephone Encounter (Signed)
-----   Message from Briscoe Deutscher, DO sent at 11/26/2018  2:10 PM EDT ----- Regarding: RE: Lovenox bridge Good to go. Thanks! EW ----- Message ----- From: Warden Fillers, RN Sent: 11/26/2018   1:37 PM EDT To: Briscoe Deutscher, DO Subject: Lovenox bridge                                 Patient is having a procedure (No date yet) but will need to stop coumadin for 4 or 5 days.  He will need a Lovenox bridge.  I just need the OK to dose.  Please advise.  Thanks you! Villa Herb, RN

## 2018-11-29 ENCOUNTER — Other Ambulatory Visit: Payer: Self-pay | Admitting: General Practice

## 2018-11-29 ENCOUNTER — Telehealth: Payer: Self-pay | Admitting: General Practice

## 2018-11-29 DIAGNOSIS — Z7901 Long term (current) use of anticoagulants: Secondary | ICD-10-CM

## 2018-11-29 MED ORDER — ENOXAPARIN SODIUM 100 MG/ML ~~LOC~~ SOLN: 100.0000 mg | SUBCUTANEOUS | Status: DC

## 2018-11-29 NOTE — Telephone Encounter (Signed)
Instructions for coumadin and Lovenox pre and post procedure.  7/16 - Last dose of coumadin 7/17 - Nothing (No coumadin and No Lovenox) 7/18 - Lovenox in the AM 7/19 - Lovenox in the AM 7/20 - Lovenox in the AM (Take by 7 am) 7/21 - Procedure (DO NOT TAKE LOVENOX TODAY) 7/22 - Lovenox in the AM and 1 1/2 tablets of coumadin 7/23 - Lovenox in the AM and 1 1/2 tablets of coumadin 7/24 - Lovenox in the AM and 1 1/2 tablets of coumadin 7/25 - Lovenox in the AM and 1 tablet of coumadin 7/26 - Stop Lovenox and continue to take 1 (6 mg) tablet of coumadin daily. 7/27 - Check INR at [facility]

## 2018-11-30 ENCOUNTER — Other Ambulatory Visit: Payer: Self-pay | Admitting: General Practice

## 2018-11-30 NOTE — Telephone Encounter (Signed)
Opened in error

## 2018-12-02 ENCOUNTER — Other Ambulatory Visit: Payer: Self-pay

## 2018-12-02 ENCOUNTER — Ambulatory Visit (HOSPITAL_COMMUNITY): Payer: 59 | Attending: Internal Medicine

## 2018-12-02 ENCOUNTER — Ambulatory Visit (INDEPENDENT_AMBULATORY_CARE_PROVIDER_SITE_OTHER): Payer: 59 | Admitting: General Practice

## 2018-12-02 DIAGNOSIS — Z7901 Long term (current) use of anticoagulants: Secondary | ICD-10-CM | POA: Diagnosis not present

## 2018-12-02 DIAGNOSIS — I428 Other cardiomyopathies: Secondary | ICD-10-CM | POA: Insufficient documentation

## 2018-12-02 DIAGNOSIS — I493 Ventricular premature depolarization: Secondary | ICD-10-CM | POA: Diagnosis not present

## 2018-12-02 DIAGNOSIS — D6859 Other primary thrombophilia: Secondary | ICD-10-CM

## 2018-12-02 LAB — POCT INR: INR: 2.7 (ref 2.0–3.0)

## 2018-12-02 MED FILL — ENOXAPARIN 100 MG/ML SYR: 100 | 7 days supply | Qty: 7 | Fill #0

## 2018-12-02 NOTE — Patient Instructions (Signed)
Pre visit review using our clinic review tool, if applicable. No additional management support is needed unless otherwise documented below in the visit note.  Instructions for coumadin and Lovenox pre and post procedure.  7/16 - Last dose of coumadin 7/17 - Nothing (No coumadin and No Lovenox) 7/18 - Lovenox in the AM 7/19 - Lovenox in the AM 7/20 - Lovenox in the AM (Take by 7 am) 7/21 - Procedure (DO NOT TAKE LOVENOX TODAY) 7/22 - Lovenox in the AM and 1 1/2 tablets of coumadin 7/23 - Lovenox in the AM and 1 1/2 tablets of coumadin 7/24 - Lovenox in the AM and 1 1/2 tablets of coumadin 7/25 - Lovenox in the AM and 1 tablet of coumadin 7/26 - Stop Lovenox and continue to take 1 (6 mg) tablet of coumadin daily. 7/27 - Check INR at [facility]

## 2018-12-04 ENCOUNTER — Telehealth: Payer: Self-pay | Admitting: Cardiology

## 2018-12-04 NOTE — Telephone Encounter (Signed)
New Message ° ° ° °Left message to confirm appt and answer covid questions  °

## 2018-12-05 NOTE — Telephone Encounter (Signed)

## 2018-12-06 ENCOUNTER — Ambulatory Visit (INDEPENDENT_AMBULATORY_CARE_PROVIDER_SITE_OTHER): Payer: 59 | Admitting: Cardiology

## 2018-12-06 ENCOUNTER — Other Ambulatory Visit: Payer: Self-pay

## 2018-12-06 ENCOUNTER — Encounter: Payer: Self-pay | Admitting: Cardiology

## 2018-12-06 VITALS — BP 122/60 | HR 69 | Ht 69.0 in | Wt 158.0 lb

## 2018-12-06 DIAGNOSIS — I493 Ventricular premature depolarization: Secondary | ICD-10-CM | POA: Diagnosis not present

## 2018-12-06 DIAGNOSIS — I779 Disorder of arteries and arterioles, unspecified: Secondary | ICD-10-CM

## 2018-12-06 MED ORDER — MEXILETINE HCL 250 MG PO CAPS: 250.0000 mg | ORAL_CAPSULE | Freq: Two times a day (BID) | ORAL | 6 refills | Status: DC

## 2018-12-06 MED FILL — MEXILETINE 250 MG CAPSULE: 250 | 30 days supply | Qty: 60 | Fill #0

## 2018-12-06 NOTE — Progress Notes (Signed)
PCP:  Briscoe Deutscher, DO Primary Cardiologist: Pixie Casino, MD Electrophysiologist: Constance Haw, MD   [name] is a 53 y.o. male with a history for antiphospholipid syndrome currently on coumadin, NICM, Nonobstructive CAD, and PVCs (48 holter 06/2018 with 23% PVCs).   He is feeling much better. He is excited that his EF has normalized. He denies symptoms of palpitations, chest pain, shortness of breath, orthopnea, PND, lower extremity edema, claudication, dizziness, presyncope, syncope, or neurologic sequela. The patient is tolerating medications without difficulties.    Echo 12/02/18 LVEF normalized to 60-65% (Improved from Echo 03/2018 LVEF 40-45%)  The patient feels that he is tolerating medications without difficulties and is otherwise without complaint today.   Past Medical History:  Diagnosis Date  . Anticoagulated on Coumadin   . Antiphospholipid antibody positive 06/27/2016   followed by dr kale ([facility])  . Anxiety with depression 06/24/2014  . Chronic back pain   . Dysrhythmia    PVCs  . ED (erectile dysfunction)   . GERD (gastroesophageal reflux disease)   . Hemorrhoids, internal, with bleeding   . Hiatal hernia   . History of adenomatous polyp of colon    07-08-2016  tubular adenoma's  . History of atrial fibrillation    episode 04/ 2018  . History of DVT of lower extremity   . Hyperlipidemia   . Internal hemorrhoid 11/19/2016  . Iron deficiency anemia due to chronic blood loss followed by dr Irene Limbo ([facility])   from hemorrhoids and on coumadin-- hx Iron infusion's and blood transfusion's x2 07-09-2016  . Neuropathy, peripheral    right foot numbness from lumbar pinched nerve  . OA (osteoarthritis)   . PAD (peripheral artery disease) ([place]) followed by dr Bridgett Larsson--  per LOV note ABI 11-14-2016  widely patent stent/  11-27-2016 per pt no claudication symptoms   hx right CIA angioplasty and stent 02-24-2016/  right CIA thromectomy and  patch angioplasty for restenosis 07-18-2015  . Primary hypercoagulable state ([place]) [D68.59] 09/15/2016  . Recovering alcoholic in remission ([place])    per pt since 04-25-2016  . S/P insertion of iliac artery stent    02-23-2017  right CIA PTA and stent/  05-11-2016  in-stent restenosis  s/p  thrombectomy/  07-17-2016 thrombectomy and  patch angioplasty right femoral artery   . Smokers' cough ([place])   . Wears glasses    Past Surgical History:  Procedure Laterality Date  . ANGIOPLASTY ILLIAC ARTERY Right 07/17/2016   Procedure: ANGIOPLASTY RIGHT COMMON ILIAC ARTERY;  Surgeon: Conrad Piute, MD;  Location: [place];  Service: Vascular;  Laterality: Right;  . ANTERIOR CERVICAL DECOMP/DISCECTOMY FUSION  07/21/2010   C5 -- C7  . CARDIOVASCULAR STRESS TEST  06-14-2016   dr hilty   normal perfusion study w/ no reversible ischemia/  stress ef 44% but visually looks better , echo ordered (LVEF 30-44%)  , normal LV wall motion   . COLONOSCOPY N/A 07/08/2016   Procedure: COLONOSCOPY;  Surgeon: Clarene Essex, MD;  [facility];  Service: Endoscopy;  Laterality: N/A;  . ESOPHAGOGASTRODUODENOSCOPY N/A 07/08/2016   Procedure: ESOPHAGOGASTRODUODENOSCOPY (EGD);  Surgeon: Clarene Essex, MD;  Location: [facility];  Service: Endoscopy;  Laterality: N/A;  . GROIN DEBRIDEMENT Right 07/25/2016   Procedure: EVACUATION HEMATOMA RIGHT GROIN;  Surgeon: Waynetta Sandy, MD;  Location: [place];  Service: Vascular;  Laterality: Right;  . HEMORRHOID SURGERY  2013  approx.   ?lanced/ banding  . HEMORRHOID SURGERY N/A 12/07/2016  Procedure: 3 COLUMN HEMORRHOIDECTOMY;  Surgeon: Leighton Ruff, MD;  Location: [facility];  Service: General;  Laterality: N/A;  . INTRAOPERATIVE ARTERIOGRAM Right 07/17/2016   Procedure: INTRA OPERATIVE ANGIOGRAM OF RIGHT COMMON ILIAC ARTERY;  Surgeon: Conrad Caseville, MD;  Location: [place];  Service: Vascular;  Laterality: Right;  . KNEE SURGERY Right 1983   cartilage  . PATCH  ANGIOPLASTY Right 07/17/2016   Procedure: PATCH ANGIOPLASTY RIGHT FEMORAL ARTERY USING Rueben Bash BIOLOGIC PATCH;  Surgeon: Conrad [place], MD;  Location: [place];  Service: Vascular;  Laterality: Right;  . PERIPHERAL VASCULAR CATHETERIZATION N/A 02/24/2016   Procedure: Abdominal Aortogram w/Lower Extremity;  Surgeon: Conrad Brownsburg, MD;  Location: [place] [facility];  Service: Cardiovascular;  Laterality: N/A;  . PERIPHERAL VASCULAR CATHETERIZATION Right 02/24/2016   Procedure: Peripheral Vascular Intervention;  Surgeon: Conrad Ellendale, MD;  Location: [place] [facility];  Service: Cardiovascular;  Laterality: Right;  Common iliac  . PERIPHERAL VASCULAR CATHETERIZATION N/A 05/11/2016   Procedure: Abdominal Aortogram;  Surgeon: Conrad Tullos, MD;  Location: [place] [facility];  Service: Cardiovascular;  Laterality: N/A;  . PERIPHERAL VASCULAR CATHETERIZATION N/A 05/11/2016   Procedure: Lower Extremity Angiography;  Surgeon: Conrad M[place]ck, MD;  Location: [place] [facility];  Service: Cardiovascular;  Laterality: N/A;  . POSTERIOR CERVICAL FUSION/FORAMINOTOMY N/A 07/05/2018   Procedure: Posterior cervical fusion with lateral mass fixation - Cervical five-Cervical six, Cervical six-Cervical seven;  Surgeon: Earnie Larsson, MD;  Location: [place];  Service: Neurosurgery;  Laterality: N/A;  . THROMBECTOMY FEMORAL ARTERY Right 07/17/2016   Procedure: THROMBECTOMY RIGHT FEMORAL ARTERY;  Surgeon: Conrad Dows, MD;  Location: [place];  Service: Vascular;  Laterality: Right;  . TRANSTHORACIC ECHOCARDIOGRAM  06-20-2016   dr hilty   ef 55-60%,  grade 1 diastolic dysfunction/  mild TR    Current Outpatient Medications  Medication Sig Dispense Refill  . carvedilol (COREG) 6.25 MG tablet Take 1 tablet (6.25 mg total) by mouth 2 (two) times daily. 60 tablet 3  . cilostazol (PLETAL) 50 MG tablet Take 1 tablet (50 mg total) by mouth 2 (two) times daily. 180 tablet 3  . esomeprazole (NEXIUM) 20 MG capsule Take 20 mg by mouth daily at 12  noon.    Marland Kitchen FLUoxetine (PROZAC) 20 MG tablet Take 1 tablet (20 mg total) by mouth daily. 90 tablet 3  . fluticasone (FLOVENT HFA) 220 MCG/ACT inhaler Inhale 2 puffs into the lungs 2 (two) times daily. 3 Inhaler 4  . gabapentin (NEURONTIN) 600 MG tablet Take 1 tablet (600 mg total) by mouth 3 (three) times daily. 270 tablet 3  . oxyCODONE-acetaminophen (PERCOCET) 10-325 MG tablet Take 1 tablet by mouth 4 (four) times daily for 30 days. 120 tablet 0  . rosuvastatin (CRESTOR) 5 MG tablet Take 1 tablet (5 mg total) by mouth daily. 90 tablet 2  . sildenafil (VIAGRA) 25 MG tablet 2-3 tabs po x 1. Take 30 minutes to 4 hours prior to sexual activity. 21 tablet 2  . warfarin (COUMADIN) 6 MG tablet Take 1 tablet (6 mg total) by mouth daily.     No current facility-administered medications for this visit.     Allergies  Allergen Reactions  . Cymbalta [Duloxetine Hcl] Other (See Comments)    Headaches    Social History   Socioeconomic History  . Marital status: Married    Spouse name: Not on file  . Number of children: Not on file  . Years of education:  Not on file  . Highest education level: Not on file  Occupational History  . Not on file  Social Needs  . Financial resource strain: Not on file  . Food insecurity    Worry: Not on file    Inability: Not on file  . Transportation needs    Medical: Not on file    Non-medical: Not on file  Tobacco Use  . Smoking status: Current Every Day Smoker    Packs/day: 0.50    Years: 33.00    Pack years: 16.50    Types: Cigarettes, E-cigarettes  . Smokeless tobacco: Never Used  . Tobacco comment: taking chantix  Substance and Sexual Activity  . Alcohol use: No    Comment: per pt recovering alcoholic in remission since 04-25-2016  . Drug use: No  . Sexual activity: Yes  Lifestyle  . Physical activity    Days per week: Not on file    Minutes per session: Not on file  . Stress: Not on file  Relationships  . Social Herbalist on  phone: Not on file    Gets together: Not on file    Attends religious service: Not on file    Active member of club or organization: Not on file    Attends meetings of clubs or organizations: Not on file    Relationship status: Not on file  . Intimate partner violence    Fear of current or ex partner: Not on file    Emotionally abused: Not on file    Physically abused: Not on file    Forced sexual activity: Not on file  Other Topics Concern  . Not on file  Social History Narrative  . Not on file     Review of Systems: General: No chills, fever, night sweats or weight changes  Cardiovascular:  No chest pain, dyspnea on exertion, edema, orthopnea, palpitations, paroxysmal nocturnal dyspnea Dermatological: No rash, lesions or masses Respiratory: No cough, dyspnea Urologic: No hematuria, dysuria Abdominal: No nausea, vomiting, diarrhea, bright red blood per rectum, melena, or hematemesis Neurologic: No visual changes, weakness, changes in mental status All other systems reviewed and are otherwise negative except as noted above.  Physical Exam: Vitals:   12/06/18 1544  BP: 122/60  Pulse: 69  Weight: 158 lb (71.7 kg)  Height: 5\' 9"  (1.753 m)    GEN- The patient is well appearing, alert and oriented x 3 today.   HEENT: normocephalic, atraumatic; sclera clear, conjunctiva pink; hearing intact; oropharynx clear; neck supple, no JVP Lymph- no cervical lymphadenopathy Lungs- Clear to ausculation bilaterally, normal work of breathing.  No wheezes, rales, rhonchi Heart- Regular rate and rhythm with frequent ectopy, no murmurs, rubs or gallops, PMI not laterally displaced GI- soft, non-tender, non-distended, bowel sounds present, no hepatosplenomegaly Extremities- no clubbing, cyanosis, or edema; DP/PT/radial pulses 2+ bilaterally MS- no significant deformity or atrophy Skin- warm and dry, no rash or lesion Psych- euthymic mood, full affect Neuro- strength and sensation are intact   EKG is ordered today. Personal review shows NSR 69 bpm with frequent PVCs including trigeminy.  Assessment and Plan:  1.  PVCs (RVOT): ECG today shows NSR 69 bpm with frequent PVCs/Trigeminy. Will increase Mexiletine to 250 mg BID and RTC in 6 months.  2.  Nonischemic cardiomyopathy: Echo 12/02/18 LVEF normalized to 60-65% (Improved from Echo 03/2018 LVEF 40-45%)   Shirley Friar, PA-C  12/06/18 4:04 PM  I have seen and examined this patient with [name].  Agree with above, note added to reflect my findings.  On exam, iRRR, no murmurs, lungs clear.  Patient with an elevated PVC burden at 22%.  He was put on mexiletine.  He is felt better with an improvement in his ejection fraction.  He remains in ventricular trigeminy today.  Will increase mexiletine.  Will M. Camnitz MD 12/06/2018 4:50 PM

## 2018-12-06 NOTE — Patient Instructions (Signed)
Medication Instructions:  Your physician has recommended you make the following change in your medication:  1. INCREASE Mexiletine to 250 mg twice daily  * If you need a refill on your cardiac medications before your next appointment, please call your pharmacy.   Labwork: None ordered  Testing/Procedures: None ordered  Follow-Up: Your physician wants you to follow-up in: 6 months with Dr. Curt Bears.  You will receive a reminder letter in the mail two months in advance. If you don't receive a letter, please call our office to schedule the follow-up appointment.   Thank you for choosing [facility]!!   Trinidad Curet, RN [phone]

## 2018-12-10 ENCOUNTER — Other Ambulatory Visit: Payer: Self-pay

## 2018-12-10 ENCOUNTER — Ambulatory Visit (INDEPENDENT_AMBULATORY_CARE_PROVIDER_SITE_OTHER): Payer: 59 | Admitting: General Practice

## 2018-12-10 ENCOUNTER — Ambulatory Visit
Admission: RE | Admit: 2018-12-10 | Discharge: 2018-12-10 | Disposition: A | Discharge status: Home / Self Care | Payer: 59 | Source: Ambulatory Visit | Attending: Family Medicine | Admitting: Family Medicine

## 2018-12-10 DIAGNOSIS — D6859 Other primary thrombophilia: Secondary | ICD-10-CM

## 2018-12-10 DIAGNOSIS — M545 Low back pain: Secondary | ICD-10-CM | POA: Diagnosis not present

## 2018-12-10 DIAGNOSIS — G8929 Other chronic pain: Secondary | ICD-10-CM

## 2018-12-10 DIAGNOSIS — Z7901 Long term (current) use of anticoagulants: Secondary | ICD-10-CM | POA: Diagnosis not present

## 2018-12-10 LAB — POCT INR: INR: 1.3 — AB (ref 2.0–3.0)

## 2018-12-10 MED ORDER — IOPAMIDOL (ISOVUE-M 200) INJECTION 41%
1.0000 mL | Freq: Once | INTRAMUSCULAR | Status: AC
  Administered 2018-12-10: 15:00:00 1 mL via EPIDURAL

## 2018-12-10 MED ORDER — METHYLPREDNISOLONE ACETATE 40 MG/ML INJ SUSP (RADIOLOG
120.0000 mg | Freq: Once | INTRAMUSCULAR | Status: AC
  Administered 2018-12-10: 120 mg via EPIDURAL

## 2018-12-10 NOTE — Discharge Instructions (Signed)
Spinal Injection Discharge Instruction Sheet  1. You may resume a regular diet and any medications that you routinely take, including pain medications.  2. No driving the rest of the day of the procedure.  3. Light activity throughout the rest of the day.  Do not do any strenuous work, exercise, bending or lifting.  The day following the procedure, you may resume normal physical activity but you should refrain from exercising or physical therapy for at least three days.   Common Side Effects:   Headaches- take your usual medications as directed by your physician.     Restlessness or inability to sleep- you may have trouble sleeping for the next few days.  Ask your referring physician if you need any medication for sleep if over the counter sleep medications do not help.   Facial flushing or redness- this should subside within a few days.   Increased pain- a temporary increase in pain a day or two following your procedure is not unusual.  Take your pain medication as prescribed by your referring physician.  You may use ice to the injection site as needed.  Please do not use heat for 24 hours.   Leg cramps  Please contact our office at [phone] for the following symptoms:  Fever greater than 100 degrees.  Headaches unresolved with medication after 2-3 days.  Increased swelling, pain, or redness at injection site.  Thank you for visiting our office.   You may resume Coumadin/Warfarin today.

## 2018-12-10 NOTE — Patient Instructions (Addendum)
Pre visit review using our clinic review tool, if applicable. No additional management support is needed unless otherwise documented below in the visit note.  Pt is holding coumadin for a spinal procedure today.  Pt has instructions for Lovenox bridge. Re-check on 7/27.

## 2018-12-16 MED FILL — CILOSTAZOL 50 MG TABLET: 50 | 90 days supply | Qty: 180 | Fill #1

## 2018-12-18 ENCOUNTER — Other Ambulatory Visit: Payer: Self-pay

## 2018-12-18 ENCOUNTER — Ambulatory Visit (INDEPENDENT_AMBULATORY_CARE_PROVIDER_SITE_OTHER): Payer: 59 | Admitting: General Practice

## 2018-12-18 DIAGNOSIS — Z7901 Long term (current) use of anticoagulants: Secondary | ICD-10-CM | POA: Diagnosis not present

## 2018-12-18 DIAGNOSIS — D6859 Other primary thrombophilia: Secondary | ICD-10-CM

## 2018-12-18 LAB — POCT INR: INR: 2.9 (ref 2.0–3.0)

## 2018-12-18 NOTE — Patient Instructions (Incomplete)
Pre visit review using our clinic review tool, if applicable. No additional management support is needed unless otherwise documented below in the visit note. 

## 2018-12-21 ENCOUNTER — Encounter: Payer: Self-pay | Admitting: Family Medicine

## 2018-12-23 ENCOUNTER — Other Ambulatory Visit: Payer: Self-pay | Admitting: Family Medicine

## 2018-12-23 DIAGNOSIS — G894 Chronic pain syndrome: Secondary | ICD-10-CM

## 2018-12-23 DIAGNOSIS — N529 Male erectile dysfunction, unspecified: Secondary | ICD-10-CM

## 2018-12-24 MED ORDER — OXYCODONE-ACETAMINOPHEN 10-325 MG PO TABS: 1.0000 | ORAL_TABLET | Freq: Four times a day (QID) | ORAL | 0 refills | Status: DC

## 2018-12-24 MED FILL — OXYCODONE-APAP 10-325: 10-325 | 30 days supply | Qty: 120 | Fill #0

## 2018-12-24 NOTE — Telephone Encounter (Signed)
Ok to refill 

## 2018-12-25 MED FILL — SILDENAFIL CITRATE 25 MG TA: 25 | 90 days supply | Qty: 18 | Fill #0

## 2018-12-28 MED FILL — CARVEDILOL 6.25 MG TABLET: 6.25 | 30 days supply | Qty: 60 | Fill #1

## 2019-01-13 MED FILL — ROSUVASTATIN CALCIUM 5 MG T: 5 | 90 days supply | Qty: 90 | Fill #2

## 2019-01-13 MED FILL — WARFARIN SODIUM 6 MG TABLET: 6 | 90 days supply | Qty: 105 | Fill #1

## 2019-01-13 MED FILL — MEXILETINE 250 MG CAPSULE: 250 | 30 days supply | Qty: 60 | Fill #1

## 2019-01-18 ENCOUNTER — Other Ambulatory Visit: Payer: Self-pay | Admitting: Family Medicine

## 2019-01-23 ENCOUNTER — Other Ambulatory Visit: Payer: Self-pay

## 2019-01-23 DIAGNOSIS — Z7901 Long term (current) use of anticoagulants: Secondary | ICD-10-CM

## 2019-01-23 DIAGNOSIS — E611 Iron deficiency: Secondary | ICD-10-CM

## 2019-01-23 NOTE — Progress Notes (Deleted)
Subjective:    [name] is a 53 y.o. male who presents today for his Complete Annual Exam.    Current Outpatient Medications:  .  carvedilol (COREG) 6.25 MG tablet, Take 1 tablet (6.25 mg total) by mouth 2 (two) times daily., Disp: 60 tablet, Rfl: 3 .  cilostazol (PLETAL) 50 MG tablet, Take 1 tablet (50 mg total) by mouth 2 (two) times daily., Disp: 180 tablet, Rfl: 3 .  esomeprazole (NEXIUM) 20 MG capsule, Take 20 mg by mouth daily at 12 noon., Disp: , Rfl:  .  FLUoxetine (PROZAC) 20 MG tablet, Take 1 tablet (20 mg total) by mouth daily., Disp: 90 tablet, Rfl: 3 .  fluticasone (FLOVENT HFA) 220 MCG/ACT inhaler, Inhale 2 puffs into the lungs 2 (two) times daily., Disp: 3 Inhaler, Rfl: 4 .  gabapentin (NEURONTIN) 600 MG tablet, Take 1 tablet (600 mg total) by mouth 3 (three) times daily., Disp: 270 tablet, Rfl: 3 .  mexiletine (MEXITIL) 250 MG capsule, Take 1 capsule (250 mg total) by mouth 2 (two) times daily., Disp: 60 capsule, Rfl: 6 .  oxyCODONE-acetaminophen (PERCOCET) 10-325 MG tablet, TAKE 1 TABLET BY MOUTH 4 (FOUR) TIMES DAILY FOR 30 DAYS., Disp: 120 tablet, Rfl: 0 .  oxyCODONE-acetaminophen (PERCOCET) 10-325 MG tablet, Take 1 tablet by mouth 4 (four) times daily., Disp: 120 tablet, Rfl: 0 .  rosuvastatin (CRESTOR) 5 MG tablet, Take 1 tablet (5 mg total) by mouth daily., Disp: 90 tablet, Rfl: 2 .  sildenafil (VIAGRA) 25 MG tablet, TAKE 2 TO 3 TABLETS BY MOUTH 30 MINUTES TO 4 HOURS PRIOR TO SEXUAL ACTIVITY, Disp: 21 tablet, Rfl: 2 .  warfarin (COUMADIN) 6 MG tablet, Take 1 tablet (6 mg total) by mouth daily., Disp: , Rfl:   Health Maintenance Due  Topic Date Due  . INFLUENZA VACCINE  12/21/2018    PMHx, SurgHx, SocialHx, Medications, and Allergies were reviewed in the Visit Navigator and updated as appropriate.   Past Medical History:  Diagnosis Date  . Anticoagulated on Coumadin   . Antiphospholipid antibody positive 06/27/2016   followed by dr kale ([facility])   . Anxiety with depression 06/24/2014  . Chronic back pain   . Dysrhythmia    PVCs  . ED (erectile dysfunction)   . GERD (gastroesophageal reflux disease)   . Hemorrhoids, internal, with bleeding   . Hiatal hernia   . History of adenomatous polyp of colon    07-08-2016  tubular adenoma's  . History of atrial fibrillation    episode 04/ 2018  . History of DVT of lower extremity   . Hyperlipidemia   . Internal hemorrhoid 11/19/2016  . Iron deficiency anemia due to chronic blood loss followed by dr Irene Limbo ([facility])   from hemorrhoids and on coumadin-- hx Iron infusion's and blood transfusion's x2 07-09-2016  . Neuropathy, peripheral    right foot numbness from lumbar pinched nerve  . OA (osteoarthritis)   . PAD (peripheral artery disease) ([place]) followed by dr Bridgett Larsson--  per LOV note ABI 11-14-2016  widely patent stent/  11-27-2016 per pt no claudication symptoms   hx right CIA angioplasty and stent 02-24-2016/  right CIA thromectomy and patch angioplasty for restenosis 07-18-2015  . Primary hypercoagulable state ([place]) [D68.59] 09/15/2016  . Recovering alcoholic in remission ([place])    per pt since 04-25-2016  . S/P insertion of iliac artery stent    02-23-2017  right CIA PTA and stent/  05-11-2016  in-stent restenosis  s/p  thrombectomy/  07-17-2016 thrombectomy and  patch angioplasty right femoral artery   . Smokers' cough ([place])   . Wears glasses     Past Surgical History:  Procedure Laterality Date  . ANGIOPLASTY ILLIAC ARTERY Right 07/17/2016   Procedure: ANGIOPLASTY RIGHT COMMON ILIAC ARTERY;  Surgeon: Conrad [place], MD;  Location: [place];  Service: Vascular;  Laterality: Right;  . ANTERIOR CERVICAL DECOMP/DISCECTOMY FUSION  07/21/2010   C5 -- C7  . CARDIOVASCULAR STRESS TEST  06-14-2016   dr hilty   normal perfusion study w/ no reversible ischemia/  stress ef 44% but visually looks better , echo ordered (LVEF 30-44%)  , normal LV wall motion   . COLONOSCOPY N/A 07/08/2016    Procedure: COLONOSCOPY;  Surgeon: Clarene Essex, MD;  [facility];  Service: Endoscopy;  Laterality: N/A;  . ESOPHAGOGASTRODUODENOSCOPY N/A 07/08/2016   Procedure: ESOPHAGOGASTRODUODENOSCOPY (EGD);  Surgeon: Clarene Essex, MD;  [facility];  Service: Endoscopy;  Laterality: N/A;  . GROIN DEBRIDEMENT Right 07/25/2016   Procedure: EVACUATION HEMATOMA RIGHT GROIN;  Surgeon: Waynetta Sandy, MD;  Location: [place];  Service: Vascular;  Laterality: Right;  . HEMORRHOID SURGERY  2013  approx.   ?lanced/ banding  . HEMORRHOID SURGERY N/A 12/07/2016   Procedure: 3 COLUMN HEMORRHOIDECTOMY;  Surgeon: Leighton Ruff, MD;  Location: [facility];  Service: General;  Laterality: N/A;  . INTRAOPERATIVE ARTERIOGRAM Right 07/17/2016   Procedure: INTRA OPERATIVE ANGIOGRAM OF RIGHT COMMON ILIAC ARTERY;  Surgeon: Conrad Lawrenceburg, MD;  Location: [place];  Service: Vascular;  Laterality: Right;  . KNEE SURGERY Right 1983   cartilage  . PATCH ANGIOPLASTY Right 07/17/2016   Procedure: PATCH ANGIOPLASTY RIGHT FEMORAL ARTERY USING Rueben Bash BIOLOGIC PATCH;  Surgeon: Conrad [place], MD;  Location: [place];  Service: Vascular;  Laterality: Right;  . PERIPHERAL VASCULAR CATHETERIZATION N/A 02/24/2016   Procedure: Abdominal Aortogram w/Lower Extremity;  Surgeon: Conrad Cartwright, MD;  Location: [place] [facility];  Service: Cardiovascular;  Laterality: N/A;  . PERIPHERAL VASCULAR CATHETERIZATION Right 02/24/2016   Procedure: Peripheral Vascular Intervention;  Surgeon: Conrad Lutsen, MD;  Location: [place] [facility];  Service: Cardiovascular;  Laterality: Right;  Common iliac  . PERIPHERAL VASCULAR CATHETERIZATION N/A 05/11/2016   Procedure: Abdominal Aortogram;  Surgeon: Conrad Roswell, MD;  Location: [place] [facility];  Service: Cardiovascular;  Laterality: N/A;  . PERIPHERAL VASCULAR CATHETERIZATION N/A 05/11/2016   Procedure: Lower Extremity Angiography;  Surgeon: Conrad [place], MD;  Location: [place]  [facility];  Service: Cardiovascular;  Laterality: N/A;  . POSTERIOR CERVICAL FUSION/FORAMINOTOMY N/A 07/05/2018   Procedure: Posterior cervical fusion with lateral mass fixation - Cervical five-Cervical six, Cervical six-Cervical seven;  Surgeon: Earnie Larsson, MD;  Location: [place];  Service: Neurosurgery;  Laterality: N/A;  . THROMBECTOMY FEMORAL ARTERY Right 07/17/2016   Procedure: THROMBECTOMY RIGHT FEMORAL ARTERY;  Surgeon: Conrad Somerdale, MD;  Location: [place];  Service: Vascular;  Laterality: Right;  . TRANSTHORACIC ECHOCARDIOGRAM  06-20-2016   dr hilty   ef 55-60%,  grade 1 diastolic dysfunction/  mild TR    Family History  Problem Relation Age of Onset  . Cancer Father        Lung    Social History   Tobacco Use  . Smoking status: Current Every Day Smoker    Packs/day: 0.50    Years: 33.00    Pack years: 16.50    Types: Cigarettes, E-cigarettes  . Smokeless tobacco: Never Used  .  Tobacco comment: taking chantix  Substance Use Topics  . Alcohol use: No    Comment: per pt recovering alcoholic in remission since 04-25-2016  . Drug use: No    Review of Systems:   Pertinent items are noted in the HPI. Otherwise, ROS is negative.  Objective:   There were no vitals filed for this visit. There is no height or weight on file to calculate BMI.  General Appearance:  Alert, cooperative, no distress, appears stated age  Head:  Normocephalic, without obvious abnormality, atraumatic  Eyes:  PERRL, conjunctiva/corneas clear, EOM's intact, fundi benign, both eyes       Ears:  Normal TM's and external ear canals, both ears  Nose: Nares normal, septum midline, mucosa normal, no drainage    or sinus tenderness  Throat: Lips, mucosa, and tongue normal; teeth and gums normal  Neck: Supple, symmetrical, trachea midline, no adenopathy; thyroid:  No enlargement/tenderness/nodules; no carotit bruit or JVD  Back:   Symmetric, no curvature, ROM normal, no CVA tenderness  Lungs:   Clear to  auscultation bilaterally, respirations unlabored  Chest wall:  No tenderness or deformity  Heart:  Regular rate and rhythm, S1 and S2 normal, no murmur, rub   or gallop  Abdomen:   Soft, non-tender, bowel sounds active all four quadrants, no masses, no organomegaly  Extremities: Extremities normal, atraumatic, no cyanosis or edema  Prostate: Not done.   Skin: Skin color, texture, turgor normal, no rashes or lesions  Lymph nodes: Cervical, supraclavicular, and axillary nodes normal  Neurologic: CNII-XII grossly intact. Normal strength, sensation and reflexes throughout   Assessment/Plan:   There are no diagnoses linked to this encounter.  Patient Counseling: [x]   Nutrition: Stressed importance of moderation in sodium/caffeine intake, saturated fat and cholesterol, caloric balance, sufficient intake of fresh fruits, vegetables, and fiber.  [x]   Stressed the importance of regular exercise.   []   Substance Abuse: Discussed cessation/primary prevention of tobacco, alcohol, or other drug use; driving or other dangerous activities under the influence; availability of treatment for abuse.   [x]   Injury prevention: Discussed safety belts, safety helmets, smoke detector, smoking near bedding or upholstery.   []   Sexuality: Discussed sexually transmitted diseases, partner selection, use of condoms, avoidance of unintended pregnancy and contraceptive alternatives.   [x]   Dental health: Discussed importance of regular tooth brushing, flossing, and dental visits.  [x]   Health maintenance and immunizations reviewed. Please refer to Health maintenance section.    Briscoe Deutscher, DO [place]

## 2019-01-23 NOTE — Telephone Encounter (Signed)
Last OV 10/25/18 Last refill 12/25/18 #120/0 Next OV 01/24/19  Forwarding to Dr. Juleen China

## 2019-01-23 NOTE — Progress Notes (Unsigned)
Subjective:    [name] is a 53 y.o. male who presents today for his Complete Annual Exam.    Current Outpatient Medications:  .  carvedilol (COREG) 6.25 MG tablet, Take 1 tablet (6.25 mg total) by mouth 2 (two) times daily., Disp: 60 tablet, Rfl: 3 .  cilostazol (PLETAL) 50 MG tablet, Take 1 tablet (50 mg total) by mouth 2 (two) times daily., Disp: 180 tablet, Rfl: 3 .  esomeprazole (NEXIUM) 20 MG capsule, Take 20 mg by mouth daily at 12 noon., Disp: , Rfl:  .  FLUoxetine (PROZAC) 20 MG tablet, Take 1 tablet (20 mg total) by mouth daily., Disp: 90 tablet, Rfl: 3 .  fluticasone (FLOVENT HFA) 220 MCG/ACT inhaler, Inhale 2 puffs into the lungs 2 (two) times daily., Disp: 3 Inhaler, Rfl: 4 .  gabapentin (NEURONTIN) 600 MG tablet, Take 1 tablet (600 mg total) by mouth 3 (three) times daily., Disp: 270 tablet, Rfl: 3 .  mexiletine (MEXITIL) 250 MG capsule, Take 1 capsule (250 mg total) by mouth 2 (two) times daily., Disp: 60 capsule, Rfl: 6 .  oxyCODONE-acetaminophen (PERCOCET) 10-325 MG tablet, Take 1 tablet by mouth 4 (four) times daily., Disp: 120 tablet, Rfl: 0 .  rosuvastatin (CRESTOR) 5 MG tablet, Take 1 tablet (5 mg total) by mouth daily., Disp: 90 tablet, Rfl: 2 .  sildenafil (VIAGRA) 25 MG tablet, TAKE 2 TO 3 TABLETS BY MOUTH 30 MINUTES TO 4 HOURS PRIOR TO SEXUAL ACTIVITY, Disp: 21 tablet, Rfl: 2 .  warfarin (COUMADIN) 6 MG tablet, Take 1 tablet (6 mg total) by mouth daily., Disp: , Rfl:   Health Maintenance Due  Topic Date Due  . INFLUENZA VACCINE  12/21/2018    PMHx, SurgHx, SocialHx, Medications, and Allergies were reviewed in the Visit Navigator and updated as appropriate.   Past Medical History:  Diagnosis Date  . Anticoagulated on Coumadin   . Antiphospholipid antibody positive 06/27/2016   followed by dr kale ([facility])  . Anxiety with depression 06/24/2014  . Chronic back pain   . Dysrhythmia    PVCs  . ED (erectile dysfunction)   . GERD  (gastroesophageal reflux disease)   . Hemorrhoids, internal, with bleeding   . Hiatal hernia   . History of adenomatous polyp of colon    07-08-2016  tubular adenoma's  . History of atrial fibrillation    episode 04/ 2018  . History of DVT of lower extremity   . Hyperlipidemia   . Internal hemorrhoid 11/19/2016  . Iron deficiency anemia due to chronic blood loss followed by dr Irene Limbo ([facility])   from hemorrhoids and on coumadin-- hx Iron infusion's and blood transfusion's x2 07-09-2016  . Neuropathy, peripheral    right foot numbness from lumbar pinched nerve  . OA (osteoarthritis)   . PAD (peripheral artery disease) ([place]) followed by dr Bridgett Larsson--  per LOV note ABI 11-14-2016  widely patent stent/  11-27-2016 per pt no claudication symptoms   hx right CIA angioplasty and stent 02-24-2016/  right CIA thromectomy and patch angioplasty for restenosis 07-18-2015  . Primary hypercoagulable state ([place]) [D68.59] 09/15/2016  . Recovering alcoholic in remission ([place])    per pt since 04-25-2016  . S/P insertion of iliac artery stent    02-23-2017  right CIA PTA and stent/  05-11-2016  in-stent restenosis  s/p  thrombectomy/  07-17-2016 thrombectomy and  patch angioplasty right femoral artery   . Smokers' cough ([place])   . Wears glasses  Past Surgical History:  Procedure Laterality Date  . ANGIOPLASTY ILLIAC ARTERY Right 07/17/2016   Procedure: ANGIOPLASTY RIGHT COMMON ILIAC ARTERY;  Surgeon: Conrad Ontario, MD;  Location: [place];  Service: Vascular;  Laterality: Right;  . ANTERIOR CERVICAL DECOMP/DISCECTOMY FUSION  07/21/2010   C5 -- C7  . CARDIOVASCULAR STRESS TEST  06-14-2016   dr hilty   normal perfusion study w/ no reversible ischemia/  stress ef 44% but visually looks better , echo ordered (LVEF 30-44%)  , normal LV wall motion   . COLONOSCOPY N/A 07/08/2016   Procedure: COLONOSCOPY;  Surgeon: Clarene Essex, MD;  Location: [facility];  Service: Endoscopy;  Laterality: N/A;  .  ESOPHAGOGASTRODUODENOSCOPY N/A 07/08/2016   Procedure: ESOPHAGOGASTRODUODENOSCOPY (EGD);  Surgeon: Clarene Essex, MD;  Location: [facility];  Service: Endoscopy;  Laterality: N/A;  . GROIN DEBRIDEMENT Right 07/25/2016   Procedure: EVACUATION HEMATOMA RIGHT GROIN;  Surgeon: Waynetta Sandy, MD;  Location: [place];  Service: Vascular;  Laterality: Right;  . HEMORRHOID SURGERY  2013  approx.   ?lanced/ banding  . HEMORRHOID SURGERY N/A 12/07/2016   Procedure: 3 COLUMN HEMORRHOIDECTOMY;  Surgeon: Leighton Ruff, MD;  Location: [facility];  Service: General;  Laterality: N/A;  . INTRAOPERATIVE ARTERIOGRAM Right 07/17/2016   Procedure: INTRA OPERATIVE ANGIOGRAM OF RIGHT COMMON ILIAC ARTERY;  Surgeon: Conrad [place], MD;  Location: [place];  Service: Vascular;  Laterality: Right;  . KNEE SURGERY Right 1983   cartilage  . PATCH ANGIOPLASTY Right 07/17/2016   Procedure: PATCH ANGIOPLASTY RIGHT FEMORAL ARTERY USING Rueben Bash BIOLOGIC PATCH;  Surgeon: Conrad Yetter, MD;  Location: [place];  Service: Vascular;  Laterality: Right;  . PERIPHERAL VASCULAR CATHETERIZATION N/A 02/24/2016   Procedure: Abdominal Aortogram w/Lower Extremity;  Surgeon: Conrad [place], MD;  Location: [place] [facility];  Service: Cardiovascular;  Laterality: N/A;  . PERIPHERAL VASCULAR CATHETERIZATION Right 02/24/2016   Procedure: Peripheral Vascular Intervention;  Surgeon: Conrad [place], MD;  Location: [place] [facility];  Service: Cardiovascular;  Laterality: Right;  Common iliac  . PERIPHERAL VASCULAR CATHETERIZATION N/A 05/11/2016   Procedure: Abdominal Aortogram;  Surgeon: Conrad Brimfield, MD;  Location: [place] [facility];  Service: Cardiovascular;  Laterality: N/A;  . PERIPHERAL VASCULAR CATHETERIZATION N/A 05/11/2016   Procedure: Lower Extremity Angiography;  Surgeon: Conrad [place], MD;  Location: [place] [facility];  Service: Cardiovascular;  Laterality: N/A;  . POSTERIOR CERVICAL FUSION/FORAMINOTOMY N/A 07/05/2018    Procedure: Posterior cervical fusion with lateral mass fixation - Cervical five-Cervical six, Cervical six-Cervical seven;  Surgeon: Earnie Larsson, MD;  Location: [place];  Service: Neurosurgery;  Laterality: N/A;  . THROMBECTOMY FEMORAL ARTERY Right 07/17/2016   Procedure: THROMBECTOMY RIGHT FEMORAL ARTERY;  Surgeon: Conrad Lacombe, MD;  Location: [place];  Service: Vascular;  Laterality: Right;  . TRANSTHORACIC ECHOCARDIOGRAM  06-20-2016   dr hilty   ef 55-60%,  grade 1 diastolic dysfunction/  mild TR    Family History  Problem Relation Age of Onset  . Cancer Father        Lung    Social History   Tobacco Use  . Smoking status: Current Every Day Smoker    Packs/day: 0.50    Years: 33.00    Pack years: 16.50    Types: Cigarettes, E-cigarettes  . Smokeless tobacco: Never Used  . Tobacco comment: taking chantix  Substance Use Topics  . Alcohol use: No    Comment: per pt recovering alcoholic in remission since  04-25-2016  . Drug use: No    Review of Systems:   Pertinent items are noted in the HPI. Otherwise, ROS is negative.  Objective:   There were no vitals filed for this visit. There is no height or weight on file to calculate BMI.  General Appearance:  Alert, cooperative, no distress, appears stated age  Head:  Normocephalic, without obvious abnormality, atraumatic  Eyes:  PERRL, conjunctiva/corneas clear, EOM's intact, fundi benign, both eyes       Ears:  Normal TM's and external ear canals, both ears  Nose: Nares normal, septum midline, mucosa normal, no drainage    or sinus tenderness  Throat: Lips, mucosa, and tongue normal; teeth and gums normal  Neck: Supple, symmetrical, trachea midline, no adenopathy; thyroid:  No enlargement/tenderness/nodules; no carotit bruit or JVD  Back:   Symmetric, no curvature, ROM normal, no CVA tenderness  Lungs:   Clear to auscultation bilaterally, respirations unlabored  Chest wall:  No tenderness or deformity  Heart:  Regular rate and  rhythm, S1 and S2 normal, no murmur, rub   or gallop  Abdomen:   Soft, non-tender, bowel sounds active all four quadrants, no masses, no organomegaly  Extremities: Extremities normal, atraumatic, no cyanosis or edema  Prostate: Not done.   Skin: Skin color, texture, turgor normal, no rashes or lesions  Lymph nodes: Cervical, supraclavicular, and axillary nodes normal  Neurologic: CNII-XII grossly intact. Normal strength, sensation and reflexes throughout   Assessment/Plan:   There are no diagnoses linked to this encounter.  Patient Counseling: [x]   Nutrition: Stressed importance of moderation in sodium/caffeine intake, saturated fat and cholesterol, caloric balance, sufficient intake of fresh fruits, vegetables, and fiber.  [x]   Stressed the importance of regular exercise.   []   Substance Abuse: Discussed cessation/primary prevention of tobacco, alcohol, or other drug use; driving or other dangerous activities under the influence; availability of treatment for abuse.   [x]   Injury prevention: Discussed safety belts, safety helmets, smoke detector, smoking near bedding or upholstery.   []   Sexuality: Discussed sexually transmitted diseases, partner selection, use of condoms, avoidance of unintended pregnancy and contraceptive alternatives.   [x]   Dental health: Discussed importance of regular tooth brushing, flossing, and dental visits.  [x]   Health maintenance and immunizations reviewed. Please refer to Health maintenance section.    Briscoe Deutscher, DO [place]

## 2019-01-24 ENCOUNTER — Telehealth: Payer: Self-pay

## 2019-01-24 ENCOUNTER — Encounter: Payer: 59 | Admitting: Family Medicine

## 2019-01-24 MED ORDER — OXYCODONE-ACETAMINOPHEN 10-325 MG PO TABS: 1.0000 | ORAL_TABLET | Freq: Four times a day (QID) | ORAL | 0 refills | Status: DC

## 2019-01-24 MED FILL — OXYCODONE-APAP 10-325: 10-325 | 30 days supply | Qty: 120 | Fill #0

## 2019-01-28 ENCOUNTER — Other Ambulatory Visit: Payer: Self-pay | Admitting: Cardiology

## 2019-01-28 MED FILL — FLUOXETINE HCL 20 MG TABS: 20 | 90 days supply | Qty: 90 | Fill #2

## 2019-01-30 ENCOUNTER — Ambulatory Visit (INDEPENDENT_AMBULATORY_CARE_PROVIDER_SITE_OTHER): Payer: 59 | Admitting: Family Medicine

## 2019-01-30 ENCOUNTER — Encounter: Payer: Self-pay | Admitting: Family Medicine

## 2019-01-30 VITALS — BP 130/62 | HR 82 | Temp 97.8°F | Ht 69.0 in | Wt 158.6 lb

## 2019-01-30 DIAGNOSIS — I429 Cardiomyopathy, unspecified: Secondary | ICD-10-CM

## 2019-01-30 DIAGNOSIS — I779 Disorder of arteries and arterioles, unspecified: Secondary | ICD-10-CM

## 2019-01-30 DIAGNOSIS — F17210 Nicotine dependence, cigarettes, uncomplicated: Secondary | ICD-10-CM | POA: Diagnosis not present

## 2019-01-30 DIAGNOSIS — J439 Emphysema, unspecified: Secondary | ICD-10-CM

## 2019-01-30 DIAGNOSIS — M5441 Lumbago with sciatica, right side: Secondary | ICD-10-CM

## 2019-01-30 DIAGNOSIS — Z23 Encounter for immunization: Secondary | ICD-10-CM

## 2019-01-30 DIAGNOSIS — D729 Disorder of white blood cells, unspecified: Secondary | ICD-10-CM

## 2019-01-30 DIAGNOSIS — G8929 Other chronic pain: Secondary | ICD-10-CM

## 2019-01-30 DIAGNOSIS — N529 Male erectile dysfunction, unspecified: Secondary | ICD-10-CM

## 2019-01-30 DIAGNOSIS — M79671 Pain in right foot: Secondary | ICD-10-CM

## 2019-01-30 DIAGNOSIS — R7989 Other specified abnormal findings of blood chemistry: Secondary | ICD-10-CM

## 2019-01-30 DIAGNOSIS — E785 Hyperlipidemia, unspecified: Secondary | ICD-10-CM

## 2019-01-30 DIAGNOSIS — D500 Iron deficiency anemia secondary to blood loss (chronic): Secondary | ICD-10-CM | POA: Diagnosis not present

## 2019-01-30 DIAGNOSIS — Z0000 Encounter for general adult medical examination without abnormal findings: Secondary | ICD-10-CM

## 2019-01-30 DIAGNOSIS — I700 Atherosclerosis of aorta: Secondary | ICD-10-CM

## 2019-01-30 DIAGNOSIS — I493 Ventricular premature depolarization: Secondary | ICD-10-CM

## 2019-01-30 DIAGNOSIS — D751 Secondary polycythemia: Secondary | ICD-10-CM

## 2019-01-30 DIAGNOSIS — R760 Raised antibody titer: Secondary | ICD-10-CM | POA: Diagnosis not present

## 2019-01-30 DIAGNOSIS — I739 Peripheral vascular disease, unspecified: Secondary | ICD-10-CM

## 2019-01-30 DIAGNOSIS — E782 Mixed hyperlipidemia: Secondary | ICD-10-CM

## 2019-01-30 DIAGNOSIS — F411 Generalized anxiety disorder: Secondary | ICD-10-CM

## 2019-01-30 DIAGNOSIS — G894 Chronic pain syndrome: Secondary | ICD-10-CM

## 2019-01-30 DIAGNOSIS — F329 Major depressive disorder, single episode, unspecified: Secondary | ICD-10-CM

## 2019-01-30 DIAGNOSIS — Z7901 Long term (current) use of anticoagulants: Secondary | ICD-10-CM

## 2019-01-30 LAB — COMPREHENSIVE METABOLIC PANEL WITH GFR
ALT: 23 U/L (ref 0–53)
AST: 23 U/L (ref 0–37)
Albumin: 4.4 g/dL (ref 3.5–5.2)
Alkaline Phosphatase: 105 U/L (ref 39–117)
BUN: 8 mg/dL (ref 6–23)
CO2: 29 meq/L (ref 19–32)
Calcium: 9.5 mg/dL (ref 8.4–10.5)
Chloride: 100 meq/L (ref 96–112)
Creatinine, Ser: 0.86 mg/dL (ref 0.40–1.50)
GFR: 93.55 mL/min
Glucose, Bld: 90 mg/dL (ref 70–99)
Potassium: 3.9 meq/L (ref 3.5–5.1)
Sodium: 138 meq/L (ref 135–145)
Total Bilirubin: 0.6 mg/dL (ref 0.2–1.2)
Total Protein: 7.4 g/dL (ref 6.0–8.3)

## 2019-01-30 LAB — POCT HEMOGLOBIN: Hemoglobin: 17 g/dL — AB (ref 11–14.6)

## 2019-01-30 LAB — CBC WITH DIFFERENTIAL/PLATELET
Basophils Absolute: 0.1 K/uL (ref 0.0–0.1)
Basophils Relative: 0.8 % (ref 0.0–3.0)
Eosinophils Absolute: 0.6 K/uL (ref 0.0–0.7)
Eosinophils Relative: 3.5 % (ref 0.0–5.0)
HCT: 50.4 % (ref 39.0–52.0)
Hemoglobin: 17.2 g/dL — ABNORMAL HIGH (ref 13.0–17.0)
Lymphocytes Relative: 22.8 % (ref 12.0–46.0)
Lymphs Abs: 3.6 K/uL (ref 0.7–4.0)
MCHC: 34.1 g/dL (ref 30.0–36.0)
MCV: 101.5 fl — ABNORMAL HIGH (ref 78.0–100.0)
Monocytes Absolute: 0.8 K/uL (ref 0.1–1.0)
Monocytes Relative: 5.2 % (ref 3.0–12.0)
Neutro Abs: 10.8 K/uL — ABNORMAL HIGH (ref 1.4–7.7)
Neutrophils Relative %: 67.7 % (ref 43.0–77.0)
Platelets: 255 K/uL (ref 150.0–400.0)
RBC: 4.96 Mil/uL (ref 4.22–5.81)
RDW: 13.6 % (ref 11.5–15.5)
WBC: 15.9 K/uL — ABNORMAL HIGH (ref 4.0–10.5)

## 2019-01-30 LAB — LIPID PANEL
Cholesterol: 136 mg/dL (ref 0–200)
HDL: 31.7 mg/dL — ABNORMAL LOW (ref >39.00)
LDL Cholesterol: 80 mg/dL (ref 0–99)
NonHDL: 103.91
Total CHOL/HDL Ratio: 4
Triglycerides: 121 mg/dL (ref 0.0–149.0)
VLDL: 24.2 mg/dL (ref 0.0–40.0)

## 2019-01-30 LAB — MAGNESIUM: Magnesium: 2 mg/dL (ref 1.5–2.5)

## 2019-01-30 MED ORDER — OXYCODONE-ACETAMINOPHEN 10-325 MG PO TABS: 1.0000 | ORAL_TABLET | Freq: Four times a day (QID) | ORAL | 0 refills | Status: AC

## 2019-01-30 MED ORDER — METHYLPREDNISOLONE ACETATE 80 MG/ML IJ SUSP
80.0000 mg | Freq: Once | INTRAMUSCULAR | Status: AC
  Administered 2019-01-30: 09:00:00 80 mg via INTRAMUSCULAR

## 2019-01-30 MED ORDER — SILDENAFIL CITRATE 25 MG PO TABS: ORAL_TABLET | ORAL | 2 refills | Status: DC

## 2019-01-30 MED ORDER — ROSUVASTATIN CALCIUM 5 MG PO TABS: 5.0000 mg | ORAL_TABLET | Freq: Every day | ORAL | 2 refills | Status: DC

## 2019-01-30 MED ORDER — CARVEDILOL 6.25 MG PO TABS: 6.2500 mg | ORAL_TABLET | Freq: Two times a day (BID) | ORAL | 10 refills | Status: DC

## 2019-01-30 MED ORDER — OXYCODONE-ACETAMINOPHEN 10-325 MG PO TABS: 1.0000 | ORAL_TABLET | Freq: Four times a day (QID) | ORAL | 0 refills | Status: DC

## 2019-01-30 MED ORDER — CILOSTAZOL 50 MG PO TABS: 50.0000 mg | ORAL_TABLET | Freq: Two times a day (BID) | ORAL | 3 refills | Status: DC

## 2019-01-30 MED ORDER — GABAPENTIN 600 MG PO TABS: 600.0000 mg | ORAL_TABLET | Freq: Three times a day (TID) | ORAL | 3 refills | Status: DC

## 2019-01-30 MED ORDER — FLUOXETINE HCL 20 MG PO TABS: 20.0000 mg | ORAL_TABLET | Freq: Every day | ORAL | 3 refills | Status: DC

## 2019-01-30 MED ORDER — FLOVENT HFA 220 MCG/ACT IN AERO: 2.0000 | INHALATION_SPRAY | Freq: Two times a day (BID) | RESPIRATORY_TRACT | 4 refills | Status: DC

## 2019-01-30 MED ORDER — PREDNISONE 5 MG PO TABS: ORAL_TABLET | ORAL | 1 refills | Status: DC

## 2019-01-30 MED FILL — CARVEDILOL 6.25 MG TABLET: 6.25 | 30 days supply | Qty: 60 | Fill #0

## 2019-01-30 MED FILL — predniSONE 5 MG (21) TBPK: 5 | 6 days supply | Qty: 21 | Fill #0

## 2019-01-30 NOTE — Telephone Encounter (Signed)
Pt's medication was sent to pt's pharmacy as requested. Confirmation received.  °

## 2019-01-30 NOTE — Progress Notes (Signed)
Subjective:    [name] is a 53 y.o. male who presents today for his Complete Annual Exam.    Current Outpatient Medications:  .  cilostazol (PLETAL) 50 MG tablet, Take 1 tablet (50 mg total) by mouth 2 (two) times daily., Disp: 180 tablet, Rfl: 3 .  esomeprazole (NEXIUM) 20 MG capsule, Take 20 mg by mouth daily at 12 noon., Disp: , Rfl:  .  FLUoxetine (PROZAC) 20 MG tablet, Take 1 tablet (20 mg total) by mouth daily., Disp: 90 tablet, Rfl: 3 .  fluticasone (FLOVENT HFA) 220 MCG/ACT inhaler, Inhale 2 puffs into the lungs 2 (two) times daily., Disp: 3 Inhaler, Rfl: 4 .  gabapentin (NEURONTIN) 600 MG tablet, Take 1 tablet (600 mg total) by mouth 3 (three) times daily., Disp: 270 tablet, Rfl: 3 .  mexiletine (MEXITIL) 250 MG capsule, Take 1 capsule (250 mg total) by mouth 2 (two) times daily., Disp: 60 capsule, Rfl: 6 .  oxyCODONE-acetaminophen (PERCOCET) 10-325 MG tablet, Take 1 tablet by mouth every 4 (four) hours as needed for pain., Disp: , Rfl:  .  [START ON 03/26/2019] oxyCODONE-acetaminophen (PERCOCET) 10-325 MG tablet, Take 1 tablet by mouth 4 (four) times daily., Disp: 120 tablet, Rfl: 0 .  rosuvastatin (CRESTOR) 5 MG tablet, Take 1 tablet (5 mg total) by mouth daily., Disp: 90 tablet, Rfl: 2 .  sildenafil (VIAGRA) 25 MG tablet, TAKE 2 TO 3 TABLETS BY MOUTH 30 MINUTES TO 4 HOURS PRIOR TO SEXUAL ACTIVITY, Disp: 21 tablet, Rfl: 2 .  warfarin (COUMADIN) 6 MG tablet, Take 1 tablet (6 mg total) by mouth daily., Disp: , Rfl:  .  carvedilol (COREG) 6.25 MG tablet, Take 1 tablet (6.25 mg total) by mouth 2 (two) times daily., Disp: 60 tablet, Rfl: 10 .  [START ON 02/23/2019] oxyCODONE-acetaminophen (PERCOCET) 10-325 MG tablet, Take 1 tablet by mouth 4 (four) times daily., Disp: 120 tablet, Rfl: 0 .  [START ON 02/23/2019] oxyCODONE-acetaminophen (PERCOCET) 10-325 MG tablet, Take 1 tablet by mouth 4 (four) times daily., Disp: 120 tablet, Rfl: 0 .  predniSONE (DELTASONE) 5 MG tablet,  6-5-4-3-2-1-off, Disp: 21 tablet, Rfl: 1  There are no preventive care reminders to display for this patient. PMHx, SurgHx, SocialHx, Medications, and Allergies were reviewed in the Visit Navigator and updated as appropriate.   Past Medical History:  Diagnosis Date  . Anticoagulated on Coumadin   . Antiphospholipid antibody positive, followed by Dr. Irene Limbo, Hematology 06/27/2016  . Anxiety with depression 06/24/2014  . Chronic back pain   . ED (erectile dysfunction)   . GERD (gastroesophageal reflux disease)   . Hemorrhoids, internal, with bleeding   . Hiatal hernia   . History of adenomatous polyp of colon, 07/08/16, tubular adenomas   . History of atrial fibrillation, episode 08/2016   . History of DVT of lower extremity   . Hyperlipidemia   . Internal hemorrhoid 11/19/2016  . Iron deficiency anemia due to chronic blood loss, thought to be from hemorrhoids while on coumadin, s/p iron infusion and blood transfusion x 2, 2018 2018  . 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 Nicotine dependence   . OA (osteoarthritis)   . PAD (peripheral artery disease) ([place]), followed by Dr Bridgett Larsson    hx right CIA angioplasty and stent 02-24-2016/right CIA thromectomy and patch angioplasty for restenosis 07-18-2015  . Primary hypercoagulable state ([place]) [D68.59] 09/15/2016  . PVCs (premature ventricular contractions)   . 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 S/P  insertion of iliac artery stent 2018   02-23-2017  right CIA PTA and stent/05-11-2016 in-stent restenosis s/p thrombectomy/2018 thrombectomy and  patch angioplasty right femoral artery   . Smokers' cough ([place])   . Wears glasses      Past Surgical History:  Procedure Laterality Date  . ANGIOPLASTY ILLIAC ARTERY Right 07/17/2016   Procedure: ANGIOPLASTY RIGHT COMMON ILIAC ARTERY;  Surgeon: Conrad Luce, MD;  Location: [place];  Service: Vascular;  Laterality: Right;  . ANTERIOR CERVICAL DECOMP/DISCECTOMY FUSION   07/21/2010   C5 -- C7  . CARDIOVASCULAR STRESS TEST  06-14-2016   dr hilty   normal perfusion study w/ no reversible ischemia/  stress ef 44% but visually looks better , echo ordered (LVEF 30-44%)  , normal LV wall motion   . COLONOSCOPY N/A 07/08/2016   Procedure: COLONOSCOPY;  Surgeon: Clarene Essex, MD;  Location: [facility];  Service: Endoscopy;  Laterality: N/A;  . ESOPHAGOGASTRODUODENOSCOPY N/A 07/08/2016   Procedure: ESOPHAGOGASTRODUODENOSCOPY (EGD);  Surgeon: Clarene Essex, MD;  Location: [facility];  Service: Endoscopy;  Laterality: N/A;  . GROIN DEBRIDEMENT Right 07/25/2016   Procedure: EVACUATION HEMATOMA RIGHT GROIN;  Surgeon: Waynetta Sandy, MD;  Location: [place];  Service: Vascular;  Laterality: Right;  . HEMORRHOID SURGERY N/A 12/07/2016   Procedure: 3 COLUMN HEMORRHOIDECTOMY;  Surgeon: Leighton Ruff, MD;  [facility];  Service: General;  Laterality: N/A;  . INTRAOPERATIVE ARTERIOGRAM Right 07/17/2016   Procedure: INTRA OPERATIVE ANGIOGRAM OF RIGHT COMMON ILIAC ARTERY;  Surgeon: Conrad Eldridge, MD;  Location: [place];  Service: Vascular;  Laterality: Right;  . KNEE SURGERY Right 1983   cartilage  . PATCH ANGIOPLASTY Right 07/17/2016   Procedure: PATCH ANGIOPLASTY RIGHT FEMORAL ARTERY USING Rueben Bash BIOLOGIC PATCH;  Surgeon: Conrad Coal Hill, MD;  Location: [place];  Service: Vascular;  Laterality: Right;  . PERIPHERAL VASCULAR CATHETERIZATION N/A 02/24/2016   Procedure: Abdominal Aortogram w/Lower Extremity;  Surgeon: Conrad Chilchinbito, MD;  Location: [place] [facility];  Service: Cardiovascular;  Laterality: N/A;  . PERIPHERAL VASCULAR CATHETERIZATION Right 02/24/2016   Procedure: Peripheral Vascular Intervention;  Surgeon: Conrad Hico, MD;  Location: [place] [facility];  Service: Cardiovascular;  Laterality: Right;  Common iliac  . PERIPHERAL VASCULAR CATHETERIZATION N/A 05/11/2016   Procedure: Abdominal Aortogram;  Surgeon: Conrad Cohoe, MD;  Location: [place]  CV [facility];  Service: Cardiovascular;  Laterality: N/A;  . PERIPHERAL VASCULAR CATHETERIZATION N/A 05/11/2016   Procedure: Lower Extremity Angiography;  Surgeon: Conrad Bottineau, MD;  Location: [place] [facility];  Service: Cardiovascular;  Laterality: N/A;  . POSTERIOR CERVICAL FUSION/FORAMINOTOMY N/A 07/05/2018   Procedure: Posterior cervical fusion with lateral mass fixation - Cervical five-Cervical six, Cervical six-Cervical seven;  Surgeon: Earnie Larsson, MD;  Location: [place];  Service: Neurosurgery;  Laterality: N/A;  . THROMBECTOMY FEMORAL ARTERY Right 07/17/2016   Procedure: THROMBECTOMY RIGHT FEMORAL ARTERY;  Surgeon: Conrad , MD;  Location: [place];  Service: Vascular;  Laterality: Right;  . TRANSTHORACIC ECHOCARDIOGRAM  06-20-2016   dr hilty   EF 55-60%,  grade 1 diastolic dysfunction, mild TR     Family History  Problem Relation Age of Onset  . Lung cancer Father     Social History   Tobacco Use  . Smoking status: Current Every Day Smoker    Packs/day: 0.50    Years: 33.00    Pack years: 16.50    Types: Cigarettes, E-cigarettes  . Smokeless tobacco: Never Used  Substance Use Topics  . Alcohol use: No    Comment: Sober since 2017  . Drug use: No    Review of Systems:   Pertinent items are noted in the HPI. Otherwise, ROS is negative.  Objective:   Vitals:   01/30/19 0745  BP: 130/62  Pulse: 82  Temp: 97.8 F (36.6 C)  SpO2: 96%   Body mass index is 23.42 kg/m.  General Appearance:  Alert, cooperative, uncomfortable-appearing, appears stated age  Head:  Normocephalic, without obvious abnormality, atraumatic  Eyes:  PERRL, conjunctiva/corneas clear, EOM's intact, fundi benign, both eyes       Ears:  Normal TM's and external ear canals, both ears  Nose: Nares normal, septum midline, mucosa normal, no drainage    or sinus tenderness  Throat: Lips, mucosa, and tongue normal; teeth and gums normal  Neck: Supple, symmetrical, trachea midline, no adenopathy; thyroid:   No enlargement/tenderness/nodules; no carotit bruit or JVD  Back:   Symmetric, no curvature, ROM normal, no CVA tenderness  Lungs:   Bilateral wheeze, decreased air movement  Chest wall:  No tenderness or deformity  Heart:  RRR  Abdomen:   Soft, non-tender, bowel sounds active all four quadrants, no masses, no organomegaly  Extremities: Extremities normal, atraumatic, no cyanosis or edema  Prostate: Not done.   Skin: Skin color, texture, turgor normal, no rashes or lesions  Lymph nodes: Cervical, supraclavicular, and axillary nodes normal  MSK:  ttp lumbar spine, spinous processes, dec ROM, + SLR right   Assessment/Plan:   Malon is here today for CPE.   Frequent PVCs Followed by Cardiology. Recent ECHO with normalized EF to 60-65%. Doing well.  PAOD (peripheral arterrial occlusion disease) ([place]), s/p R CIA PTA+S on 02/24/16 by Dr. Bridgett Larsson, on Coumadin, Pletal, and Gabapentin Vascular 11/13/18 visit reviewed. Noted increased stenosis in the right iliac arteries compared to the exam on 10/07/17. Still smoking. Continues Coumadin, Pletal, and Gabapentin. Reviewed the importance of smoking cessation. Discussed making inside the home a smoke-free zone. Continue walking as much as tolerated.   Pulmonary emphysema ([place]), still smoking CT chest 07/11/17: 1. Underlying centrilobular and mild paraseptal emphysematous change. No edema or consolidation. No pleural effusion. Areas of mild scarring, most notably in the superior segment left lower lobe.  2. Small nodular opacities left upper lobe posteriorly, largest measuring 2 mm. No follow-up needed if patient is low-risk (and has no known or suspected primary neoplasm). Non-contrast chest CT can be considered in 12 months if patient is high-risk. This recommendation follows the consensus statement: Guidelines for Management of Incidental Pulmonary Nodules Detected on CT Images: From the Fleischner Society 2017; Radiology 2017; 284:228-243.  3.  Mild  lower lobe bronchiectatic change bilaterally.  Due for repeat CT chest. Will offer CT +/- referral to Pulmonology. Encouraged smoking cessation. Gave samples of Symbicort.   Chronic right-sided low back pain with right-sided sciatica Worsened. Patient with more severe pain noted today. Radiates to right leg. Feels that his leg may "go out." Difficult for patient to stand from a sitting position. He took Percocet prior to coming in, without eating. Sweating from pain, some nausea from gastric irritation. Crackers helped with nausea. Plan: DepoMedrol in office today, Prednisone taper after. Continue current medications. Stat MRI, last 09/2016.   Addendum Mr Lumbar Spine Wo Contrast, result Date: 02/01/2019 CLINICAL DATA:  Progressive low back pain and bilateral leg pain. EXAM: MRI LUMBAR SPINE WITHOUT CONTRAST TECHNIQUE: Multiplanar, multisequence MR imaging of the lumbar spine was performed. No intravenous  contrast was administered. COMPARISON:  MRI dated 09/30/2016 FINDINGS: Segmentation:  Standard. Alignment:  Physiologic. Vertebrae:  No fracture, evidence of discitis, or bone lesion. Conus medullaris and cauda equina: Conus extends to the L1 level. Conus and cauda equina appear normal. Paraspinal and other soft tissues: Negative. Disc levels: L1-2: Normal. L2-3: Normal. L3-4: Left far lateral annular tear without disc bulging or protrusion. This is new since the prior study. Slight disc desiccation. Otherwise normal. L4-5: Slight disc desiccation. Slightly increased broad-based disc bulge. Left far lateral annular tear. Slight narrowing of the neural foramina bilaterally. However, the L4 nerves appear to exit without impingement. L5-S1: Small disc bulge and annular tear central and slightly to the left without neural impingement. This appears less prominent than on the prior study. IMPRESSION: 1. Slight progression of degenerative disc disease at L4-5 with an increased broad-based bulge without focal neural  impingement. 2. New left far lateral annular tear at L3-4 without neural impingement. Electronically Signed   By: Lorriane Shire M.D.   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 Tolerating medication. No concerns re: abuse. Continue current treatment. Rx provided today.   Polycythemia CBC Latest Ref Rng & Units 01/30/2019 01/30/2019 10/10/2018  WBC 4.0 - 10.5 K/uL 15.9(H) - 10.4  Hemoglobin 11 - 14.6 g/dL 17(A) 17.2(H) 16.7  Hematocrit 39.0 - 52.0 % 50.4 - 48.6  Platelets 150.0 - 400.0 K/uL 255.0 - 272.0   Likely 2/2 smoking and chronic hypoxia. Now that > 17, will ask Hematology and Pulmonology to weigh in. May need oxygen treatment.   Orders Placed This Encounter  Procedures  . MR Lumbar Spine Wo Contrast  . CBC with Differential/Platelet  . Comprehensive metabolic panel  . Iron, TIBC and Ferritin Panel  . Magnesium  . Lipid panel  . POCT hemoglobin   Meds ordered this encounter  Medications  . cilostazol (PLETAL) 50 MG tablet    Sig: Take 1 tablet (50 mg total) by mouth 2 (two) times daily.    Dispense:  180 tablet    Refill:  3  . FLUoxetine (PROZAC) 20 MG tablet    Sig: Take 1 tablet (20 mg total) by mouth daily.    Dispense:  90 tablet    Refill:  3  . fluticasone (FLOVENT HFA) 220 MCG/ACT inhaler    Sig: Inhale 2 puffs into the lungs 2 (two) times daily.    Dispense:  3 Inhaler    Refill:  4  . gabapentin (NEURONTIN) 600 MG tablet    Sig: Take 1 tablet (600 mg total) by mouth 3 (three) times daily.    Dispense:  270 tablet    Refill:  3  . oxyCODONE-acetaminophen (PERCOCET) 10-325 MG tablet    Sig: Take 1 tablet by mouth 4 (four) times daily.    Dispense:  120 tablet    Refill:  0  . rosuvastatin (CRESTOR) 5 MG tablet    Sig: Take 1 tablet (5 mg total) by mouth daily.    Dispense:  90 tablet    Refill:  2  . sildenafil  (VIAGRA) 25 MG tablet    Sig: TAKE 2 TO 3 TABLETS BY MOUTH 30 MINUTES TO 4 HOURS PRIOR TO SEXUAL ACTIVITY    Dispense:  21 tablet    Refill:  2  . oxyCODONE-acetaminophen (PERCOCET) 10-325 MG tablet    Sig: Take 1 tablet by mouth 4 (  four) times daily.    Dispense:  120 tablet    Refill:  0  . oxyCODONE-acetaminophen (PERCOCET) 10-325 MG tablet    Sig: Take 1 tablet by mouth 4 (four) times daily.    Dispense:  120 tablet    Refill:  0  . predniSONE (DELTASONE) 5 MG tablet    Sig: 6-5-4-3-2-1-off    Dispense:  21 tablet    Refill:  1  . methylPREDNISolone acetate (DEPO-MEDROL) injection 80 mg   Patient Counseling: [x]   Nutrition: Stressed importance of moderation in sodium/caffeine intake, saturated fat and cholesterol, caloric balance, sufficient intake of fresh fruits, vegetables, and fiber.  [x]   Stressed the importance of regular exercise.   []   Substance Abuse: Discussed cessation/primary prevention of tobacco, alcohol, or other drug use; driving or other dangerous activities under the influence; availability of treatment for abuse.   [x]   Injury prevention: Discussed safety belts, safety helmets, smoke detector, smoking near bedding or upholstery.   []   Sexuality: Discussed sexually transmitted diseases, partner selection, use of condoms, avoidance of unintended pregnancy and contraceptive alternatives.   [x]   Dental health: Discussed importance of regular tooth brushing, flossing, and dental visits.  [x]   Health maintenance and immunizations reviewed. Please refer to Health maintenance section.    Briscoe Deutscher, DO [place]

## 2019-01-31 LAB — IRON,TIBC AND FERRITIN PANEL
%SAT: 24 % (calc) (ref 20–48)
Ferritin: 240 ng/mL (ref 38–380)
Iron: 69 ug/dL (ref 50–180)
TIBC: 284 mcg/dL (calc) (ref 250–425)

## 2019-02-01 ENCOUNTER — Ambulatory Visit (HOSPITAL_COMMUNITY)
Admission: RE | Admit: 2019-02-01 | Discharge: 2019-02-01 | Disposition: A | Discharge status: Home / Self Care | Payer: 59 | Source: Ambulatory Visit | Attending: Family Medicine | Admitting: Family Medicine

## 2019-02-01 DIAGNOSIS — M4802 Spinal stenosis, cervical region: Secondary | ICD-10-CM | POA: Diagnosis not present

## 2019-02-01 DIAGNOSIS — M5441 Lumbago with sciatica, right side: Secondary | ICD-10-CM | POA: Insufficient documentation

## 2019-02-01 DIAGNOSIS — M545 Low back pain: Secondary | ICD-10-CM | POA: Diagnosis not present

## 2019-02-02 ENCOUNTER — Encounter: Payer: Self-pay | Admitting: Family Medicine

## 2019-02-02 DIAGNOSIS — I493 Ventricular premature depolarization: Secondary | ICD-10-CM | POA: Insufficient documentation

## 2019-02-02 DIAGNOSIS — Z7901 Long term (current) use of anticoagulants: Secondary | ICD-10-CM | POA: Insufficient documentation

## 2019-02-02 DIAGNOSIS — F329 Major depressive disorder, single episode, unspecified: Secondary | ICD-10-CM | POA: Insufficient documentation

## 2019-02-02 DIAGNOSIS — D751 Secondary polycythemia: Secondary | ICD-10-CM | POA: Insufficient documentation

## 2019-02-02 DIAGNOSIS — I700 Atherosclerosis of aorta: Secondary | ICD-10-CM | POA: Insufficient documentation

## 2019-02-02 DIAGNOSIS — F419 Anxiety disorder, unspecified: Secondary | ICD-10-CM | POA: Insufficient documentation

## 2019-02-02 DIAGNOSIS — F339 Major depressive disorder, recurrent, unspecified: Secondary | ICD-10-CM | POA: Insufficient documentation

## 2019-02-02 NOTE — Assessment & Plan Note (Signed)
CBC Latest Ref Rng & Units 01/30/2019 01/30/2019 10/10/2018  WBC 4.0 - 10.5 K/uL 15.9(H) - 10.4  Hemoglobin 11 - 14.6 g/dL 17(A) 17.2(H) 16.7  Hematocrit 39.0 - 52.0 % 50.4 - 48.6  Platelets 150.0 - 400.0 K/uL 255.0 - 272.0   Likely 2/2 smoking and chronic hypoxia. Now that > 17, will ask Hematology and Pulmonology to weigh in. May need oxygen treatment.

## 2019-02-02 NOTE — Assessment & Plan Note (Signed)
Database reviewed. Tolerating medication. No concerns re: abuse. Continue current treatment. Rx provided today.

## 2019-02-02 NOTE — Assessment & Plan Note (Signed)
Worsened. Patient with more severe pain noted today. Radiates to right leg. Feels that his leg may "go out." Difficult for patient to stand from a sitting position. He took Percocet prior to coming in, without eating. Sweating from pain, some nausea from gastric irritation. Crackers helped with nausea. Plan: DepoMedrol in office today, Prednisone taper after. Continue current medications. Stat MRI, last 09/2016.   Addendum Mr Lumbar Spine Wo Contrast, result Date: 02/01/2019 CLINICAL DATA:  Progressive low back pain and bilateral leg pain. EXAM: MRI LUMBAR SPINE WITHOUT CONTRAST TECHNIQUE: Multiplanar, multisequence MR imaging of the lumbar spine was performed. No intravenous contrast was administered. COMPARISON:  MRI dated 09/30/2016 FINDINGS: Segmentation:  Standard. Alignment:  Physiologic. Vertebrae:  No fracture, evidence of discitis, or bone lesion. Conus medullaris and cauda equina: Conus extends to the L1 level. Conus and cauda equina appear normal. Paraspinal and other soft tissues: Negative. Disc levels: L1-2: Normal. L2-3: Normal. L3-4: Left far lateral annular tear without disc bulging or protrusion. This is new since the prior study. Slight disc desiccation. Otherwise normal. L4-5: Slight disc desiccation. Slightly increased broad-based disc bulge. Left far lateral annular tear. Slight narrowing of the neural foramina bilaterally. However, the L4 nerves appear to exit without impingement. L5-S1: Small disc bulge and annular tear central and slightly to the left without neural impingement. This appears less prominent than on the prior study. IMPRESSION: 1. Slight progression of degenerative disc disease at L4-5 with an increased broad-based bulge without focal neural impingement. 2. New left far lateral annular tear at L3-4 without neural impingement. Electronically Signed   By: Lorriane Shire M.D.   On: 02/01/2019 13:26

## 2019-02-02 NOTE — Assessment & Plan Note (Signed)
Vascular 11/13/18 visit reviewed. Noted increased stenosis in the right iliac arteries compared to the exam on 10/07/17. Still smoking. Continues Coumadin, Pletal, and Gabapentin. Reviewed the importance of smoking cessation. Discussed making inside the home a smoke-free zone. Continue walking as much as tolerated.

## 2019-02-02 NOTE — Assessment & Plan Note (Signed)
Followed by Cardiology. Recent ECHO with normalized EF to 60-65%. Doing well.

## 2019-02-02 NOTE — Assessment & Plan Note (Addendum)
CT chest 07/11/17: 1. Underlying centrilobular and mild paraseptal emphysematous change. No edema or consolidation. No pleural effusion. Areas of mild scarring, most notably in the superior segment left lower lobe.  2. Small nodular opacities left upper lobe posteriorly, largest measuring 2 mm. No follow-up needed if patient is low-risk (and has no known or suspected primary neoplasm). Non-contrast chest CT can be considered in 12 months if patient is high-risk. This recommendation follows the consensus statement: Guidelines for Management of Incidental Pulmonary Nodules Detected on CT Images: From the Fleischner Society 2017; Radiology 2017; 284:228-243.  3.  Mild lower lobe bronchiectatic change bilaterally.  Due for repeat CT chest. Will offer CT +/- referral to Pulmonology. Encouraged smoking cessation. Gave samples of Symbicort.

## 2019-02-05 ENCOUNTER — Other Ambulatory Visit: Payer: Self-pay

## 2019-02-05 ENCOUNTER — Ambulatory Visit (INDEPENDENT_AMBULATORY_CARE_PROVIDER_SITE_OTHER): Payer: 59 | Admitting: General Practice

## 2019-02-05 DIAGNOSIS — Z7901 Long term (current) use of anticoagulants: Secondary | ICD-10-CM

## 2019-02-05 DIAGNOSIS — D6859 Other primary thrombophilia: Secondary | ICD-10-CM

## 2019-02-05 LAB — POCT INR: INR: 3.4 — AB (ref 2.0–3.0)

## 2019-02-05 NOTE — Patient Instructions (Addendum)
Pre visit review using our clinic review tool, if applicable. No additional management support is needed unless otherwise documented below in the visit note.  Holds coumadin today and take 1/2 tablet tomorrow.  On Friday continue to take 6 mg daily.  Re-check in 4 weeks.

## 2019-02-05 NOTE — Progress Notes (Signed)
I have reviewed the results and agree with this plan   

## 2019-02-15 MED FILL — MEXILETINE 250 MG CAPSULE: 250 | 30 days supply | Qty: 60 | Fill #2

## 2019-02-19 ENCOUNTER — Encounter: Payer: Self-pay | Admitting: Family Medicine

## 2019-02-20 NOTE — Telephone Encounter (Signed)
Error

## 2019-02-26 MED FILL — OXYCODONE-APAP 10-325: 10-325 | 30 days supply | Qty: 120 | Fill #0

## 2019-03-03 MED FILL — CARVEDILOL 6.25 MG TABLET: 6.25 | 30 days supply | Qty: 60 | Fill #1

## 2019-03-05 ENCOUNTER — Other Ambulatory Visit: Payer: Self-pay

## 2019-03-05 ENCOUNTER — Ambulatory Visit (INDEPENDENT_AMBULATORY_CARE_PROVIDER_SITE_OTHER): Payer: 59 | Admitting: General Practice

## 2019-03-05 DIAGNOSIS — Z7901 Long term (current) use of anticoagulants: Secondary | ICD-10-CM

## 2019-03-05 DIAGNOSIS — D6859 Other primary thrombophilia: Secondary | ICD-10-CM

## 2019-03-05 LAB — POCT INR: INR: 3.3 — AB (ref 2.0–3.0)

## 2019-03-05 NOTE — Patient Instructions (Addendum)
Pre visit review using our clinic review tool, if applicable. No additional management support is needed unless otherwise documented below in the visit note.  Hold coumadin today and take 1/2 tablet tomorrow.  On Friday continue to take 6 mg daily.  Re-check in 4 weeks.

## 2019-03-11 MED FILL — GABAPENTIN 600 MG TABLET: 600 | 90 days supply | Qty: 270 | Fill #0

## 2019-03-18 MED FILL — MEXILETINE 250 MG CAPSULE: 250 | 30 days supply | Qty: 60 | Fill #3

## 2019-03-28 MED FILL — OXYCODONE-APAP 10-325: 10-325 | 30 days supply | Qty: 120 | Fill #0

## 2019-03-31 MED FILL — CARVEDILOL 6.25 MG TABLET: 6.25 | 30 days supply | Qty: 60 | Fill #2

## 2019-04-02 ENCOUNTER — Ambulatory Visit (INDEPENDENT_AMBULATORY_CARE_PROVIDER_SITE_OTHER): Payer: 59 | Admitting: General Practice

## 2019-04-02 ENCOUNTER — Other Ambulatory Visit: Payer: Self-pay

## 2019-04-02 DIAGNOSIS — D6859 Other primary thrombophilia: Secondary | ICD-10-CM

## 2019-04-02 DIAGNOSIS — Z7901 Long term (current) use of anticoagulants: Secondary | ICD-10-CM

## 2019-04-02 LAB — POCT INR: INR: 3.5 — AB (ref 2.0–3.0)

## 2019-04-02 NOTE — Patient Instructions (Addendum)
Pre visit review using our clinic review tool, if applicable. No additional management support is needed unless otherwise documented below in the visit note.  Hold coumadin today and change dosage and take 6 mg daily except take 3 mg on Wednesdays and Saturdays  Re-check in 4 weeks.

## 2019-04-14 MED FILL — CILOSTAZOL 50 MG TABLET: 50 | 90 days supply | Qty: 180 | Fill #2

## 2019-04-14 MED FILL — ROSUVASTATIN CALCIUM 5 MG T: 5 | 90 days supply | Qty: 90 | Fill #0

## 2019-04-21 ENCOUNTER — Other Ambulatory Visit: Payer: Self-pay

## 2019-04-21 MED FILL — MEXILETINE 250 MG CAPSULE: 250 | 30 days supply | Qty: 60 | Fill #4

## 2019-04-22 ENCOUNTER — Encounter: Payer: Self-pay | Admitting: Family Medicine

## 2019-04-22 ENCOUNTER — Ambulatory Visit (INDEPENDENT_AMBULATORY_CARE_PROVIDER_SITE_OTHER): Payer: 59 | Admitting: Family Medicine

## 2019-04-22 VITALS — BP 100/64 | HR 64 | Temp 97.2°F | Ht 69.0 in | Wt 163.0 lb

## 2019-04-22 DIAGNOSIS — I779 Disorder of arteries and arterioles, unspecified: Secondary | ICD-10-CM

## 2019-04-22 DIAGNOSIS — Z7901 Long term (current) use of anticoagulants: Secondary | ICD-10-CM

## 2019-04-22 DIAGNOSIS — R760 Raised antibody titer: Secondary | ICD-10-CM

## 2019-04-22 DIAGNOSIS — D751 Secondary polycythemia: Secondary | ICD-10-CM

## 2019-04-22 DIAGNOSIS — G894 Chronic pain syndrome: Secondary | ICD-10-CM | POA: Diagnosis not present

## 2019-04-22 DIAGNOSIS — J439 Emphysema, unspecified: Secondary | ICD-10-CM

## 2019-04-22 DIAGNOSIS — F1120 Opioid dependence, uncomplicated: Secondary | ICD-10-CM | POA: Insufficient documentation

## 2019-04-22 DIAGNOSIS — F17210 Nicotine dependence, cigarettes, uncomplicated: Secondary | ICD-10-CM | POA: Diagnosis not present

## 2019-04-22 LAB — CBC WITH DIFFERENTIAL/PLATELET
Basophils Absolute: 0.1 10*3/uL (ref 0.0–0.1)
Basophils Relative: 0.9 % (ref 0.0–3.0)
Eosinophils Absolute: 0.6 10*3/uL (ref 0.0–0.7)
Eosinophils Relative: 3.5 % (ref 0.0–5.0)
HCT: 43.8 % (ref 39.0–52.0)
Hemoglobin: 14.8 g/dL (ref 13.0–17.0)
Lymphocytes Relative: 21.6 % (ref 12.0–46.0)
Lymphs Abs: 3.4 10*3/uL (ref 0.7–4.0)
MCHC: 33.8 g/dL (ref 30.0–36.0)
MCV: 101.3 fl — ABNORMAL HIGH (ref 78.0–100.0)
Monocytes Absolute: 0.8 10*3/uL (ref 0.1–1.0)
Monocytes Relative: 5.2 % (ref 3.0–12.0)
Neutro Abs: 10.8 10*3/uL — ABNORMAL HIGH (ref 1.4–7.7)
Neutrophils Relative %: 68.8 % (ref 43.0–77.0)
Platelets: 279 10*3/uL (ref 150.0–400.0)
RBC: 4.32 Mil/uL (ref 4.22–5.81)
RDW: 13.4 % (ref 11.5–15.5)
WBC: 15.7 10*3/uL — ABNORMAL HIGH (ref 4.0–10.5)

## 2019-04-22 MED ORDER — OXYCODONE-ACETAMINOPHEN 10-325 MG PO TABS: 1.0000 | ORAL_TABLET | Freq: Four times a day (QID) | ORAL | 0 refills | Status: AC

## 2019-04-22 MED ORDER — CYCLOBENZAPRINE HCL 10 MG PO TABS: 10.0000 mg | ORAL_TABLET | Freq: Every evening | ORAL | 1 refills | Status: DC | PRN

## 2019-04-22 MED FILL — CYCLOBENZAPRINE HCL 10 MG T: 10 | 30 days supply | Qty: 30 | Fill #0

## 2019-04-22 NOTE — Patient Instructions (Signed)
Please return in 3 months for recheck.  We will call you with information regarding your referral appointment. Pain management specialist.  If you do not hear from [place] within the next 2 weeks, please let me know. It can take 1-2 weeks to get appointments set up with the specialists.    It was a pleasure meeting you today! Thank you for choosing [place] to meet your healthcare needs! I truly look forward to working with you. If you have any questions or concerns, please send me a message via Mychart or call the office at [phone].  We will consider other ways to manage your anxiety and hopefully help you quit smoking.  We can consider adjusting your mood medication. Also, I will assess your lungs and lung medications in the near future.

## 2019-04-22 NOTE — Progress Notes (Signed)
Subjective  CC:  Chief Complaint  Patient presents with   Transitions Of Care   Back Pain    HPI: [name] is a 53 y.o. male who presents to [facility] [place] today to establish care with me as a new patient.  Complicated PMH: I've personally reviewed recent office visit notes, hospital notes, associated labs and imaging reports and/or pertinent outside office records via chart review or CareEverywhere. Problem was reviewed in detail and updated.   He has the following concerns or needs:  Chronic pain on chronic narcotics x 1 year per prior PCP. Percocet qid. PMP database reviewed. Prior care by NS who has signed off. Never has been to pain mgt. Reports was overusing advil; he is on coumadin, has h/o anemia, iron deficiency with unclear source managed by hematology, and has h/o alcoholism now in remission x 3 years. Has chronic anxiety managed with prozac and smoking. Has vascular disease including sx PAD manage by vascular surgery. Copd on flovent.   He reports he is doing well. Had cpe in sept. Reviewed labs and notes. imms are up to date. His problems are becoming controlled.   Worsening pain. Needs refills of pain meds.   precontemplative on smoking cessation due to ongoing anxiety.   Disabled due to chronic pain.   Assessment  1. 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 Chronic narcotic dependence ([place])   3. Antiphospholipid antibody positive, requires lifetime Coumadin   4. Long term (current) use of anticoagulants [Z79.01]   5. Cigarette nicotine dependence without complication   6. PAOD (peripheral arterrial occlusion disease) ([place]), s/p R CIA PTA+S on 02/24/16 by Dr. Bridgett Larsson, on Coumadin, Pletal, and Gabapentin   7. Polycythemia   8. Pulmonary emphysema, unspecified emphysema type ([place])      Plan   Chronic pain and narcotic  dependence. Reviewed recent mri results. Refer to pain mgt. Bridge with month refill of narcotics. Add mm relaxer. HR due to h/o alcoholism  Smoker; need to treat anxiety: consider wellbutrin. Patches. Further counseling. Not yet ready. Need to quit given vascular disease and hypercoaguable state.   Anxiety: will consider changing meds: zoloft, wellbutrin, buspar hydroxyzine are all good options. Avoid benzos  Copd: will reassess. Not on bronchodilator.   H/o anemia: recheck. Now with polycythemia.  Close f/u q 3 months.   Follow up:  No follow-ups on file. Orders Placed This Encounter  Procedures   CBC w/Diff   Meds ordered this encounter  Medications   oxyCODONE-acetaminophen (PERCOCET) 10-325 MG tablet    Sig: Take 1 tablet by mouth 4 (four) times daily.    Dispense:  120 tablet    Refill:  0   cyclobenzaprine (FLEXERIL) 10 MG tablet    Sig: Take 1 tablet (10 mg total) by mouth at bedtime as needed for muscle spasms.    Dispense:  30 tablet    Refill:  1     Depression screen [facility] 2/9 10/25/2018 05/10/2018  Decreased Interest 0 2  Down, Depressed, Hopeless 0 1  PHQ - 2 Score 0 3  Altered sleeping - 1  Tired, decreased energy 0 1  Change in appetite 0 2  Feeling bad or failure about yourself  0 0  Trouble concentrating 0 0  Moving slowly or fidgety/restless 0 0  Suicidal thoughts 0 0  PHQ-9 Score - 7  Difficult doing work/chores Not difficult  at all Somewhat difficult  Some recent data might be hidden    We updated and reviewed the patient's past history in detail and it is documented below.  Patient Active Problem List   Diagnosis Date Noted   Chronic narcotic dependence ([place]) 04/22/2019    Priority: High   Polycythemia 02/02/2019    Priority: High   Pulmonary emphysema ([place]), still smoking 08/04/2018    Priority: High    CT chest 07/11/17:  IMPRESSION: 1. Underlying centrilobular and mild paraseptal emphysematous change. No edema or consolidation. No  pleural effusion. Areas of mild scarring, most notably in the superior segment left lower lobe.  2. Small nodular opacities left upper lobe posteriorly, largest measuring 2 mm. No follow-up needed if patient is low-risk (and has no known or suspected primary neoplasm). Non-contrast chest CT can be considered in 12 months if patient is high-risk. This recommendation follows the consensus statement: Guidelines for Management of Incidental Pulmonary Nodules Detected on CT Images: From the Fleischner Society 2017; Radiology 2017; 284:228-243.  3.  Mild lower lobe bronchiectatic change bilaterally.    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 EXAM: MRI LUMBAR SPINE WITHOUT CONTRAST TECHNIQUE: Multiplanar, multisequence MR imaging of the lumbar spine was performed. No intravenous contrast was administered. COMPARISON:  MRI dated 09/30/2016 FINDINGS: Segmentation:  Standard. Alignment:  Physiologic. Vertebrae:  No fracture, evidence of discitis, or bone lesion. Conus medullaris and cauda equina: Conus extends to the L1 level. Conus and cauda equina appear normal. Paraspinal and other soft tissues: Negative. Disc levels: L1-2: Normal. L2-3: Normal. L3-4: Left far lateral annular tear without disc bulging or protrusion. This is new since the prior study. Slight disc desiccation. Otherwise normal. L4-5: Slight disc desiccation. Slightly increased broad-based disc bulge. Left far lateral annular tear. Slight narrowing of the neural foramina bilaterally. However, the L4 nerves appear to exit without impingement. L5-S1: Small disc bulge and annular tear central and slightly  to the left without neural impingement. This appears less prominent than on the prior study. IMPRESSION: 1. Slight progression of degenerative disc disease at L4-5 with an increased broad-based bulge without focal neural impingement. 2. New left far lateral annular tear at L3-4 without neural impingement. Electronically Signed   By: Lorriane Shire M.D.   On: 02/01/2019 13:26      Long term (current) use of anticoagulants [Z79.01] 09/15/2016    Priority: High   Dyslipidemia, Rx Crestor 06/27/2016    Priority: High   Antiphospholipid antibody positive, requires lifetime Coumadin 06/27/2016    Priority: High   PAOD (peripheral arterrial occlusion disease) ([place]), s/p R CIA PTA+S on 02/24/16 by Dr. Bridgett Larsson, on Coumadin, Pletal, and Gabapentin 06/02/2016    Priority: High   Nicotine dependence 06/24/2014    Priority: High   History of cardiomyopathy ([place]) 08/04/2018    Priority: Medium    ECHO 12/02/18  1. The left ventricle has normal systolic function with an ejection fraction of 60-65%. The cavity size was normal. Left ventricular diastolic parameters were normal.  2. The right ventricle has normal systolic function. The cavity was normal. There is no increase in right ventricular wall thickness.  3. The mitral valve  is grossly normal.  4. The aortic valve is tricuspid. Mild thickening of the aortic valve.    Cervical pseudoarthrosis [facility]), s/p C5-C7 posterior cervical fusion 06/2018, Dr. Annette Stable 07/05/2018    Priority: Medium    Discharge Summary  Date of Admission: 07/05/2018  Date of Discharge: 07/06/18  Attending Physician: Earnie Larsson, MD  Hospital Course: Patient was admitted following an uncomplicated [id number] posterior cervical fusion for pseudoarthrosis. He was recovered in PACU and transferred to the spine recovery center. His hospital course was uncomplicated and the patient was discharged home on 07/06/2018. He will follow up in clinic with Dr. Annette Stable in 2 weeks.    History of  alcohol abuse, sober since 04/2016 07/11/2016    Priority: Medium   Gastroesophageal reflux disease, with small hiatal hernia, Rx Nexium     Priority: Medium   Frequent PVCs 02/02/2019    Priority: Low    Samvit Peot is a 53 y.o. male with a history for antiphospholipid syndrome currently on coumadin, NICM, nonobstructive CAD, and PVCs (48 holter 06/2018 with 23% PVCs). He is feeling much better. He is excited that his EF has normalized. He denies symptoms of palpitations, chest pain, shortness of breath, orthopnea, PND, lower extremity edema, claudication, dizziness, presyncope, syncope, or neurologic sequela. The patient is tolerating medications without difficulties.  Echo 12/02/18 LVEF normalized to 60-65% (Improved from Echo 03/2018 LVEF 40-45%). 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 Could increase thrombotic risk especially if he has a polycythemic response. If he decided to pursue this with an understanding of the risk - would use lowest dose of testosterone to maintain level low normal 300-400.  2020: Secondary polycythemia. Not a candidate.    Arthritis of left acromioclavicular joint 09/29/2016   Health Maintenance  Topic Date Due   COLONOSCOPY  07/08/2026   TETANUS/TDAP  02/24/2027   INFLUENZA VACCINE  Completed   HIV Screening  Completed   Immunization History  Administered Date(s) Administered   Influenza,inj,Quad PF,6+ Mos 07/05/2016, 02/23/2017,  02/08/2018, 01/30/2019   Influenza-Unspecified 07/05/2016, 02/08/2018, 01/30/2019   Tdap 02/23/2017   Current Meds  Medication Sig   carvedilol (COREG) 6.25 MG tablet Take 1 tablet (6.25 mg total) by mouth 2 (two) times daily.   cilostazol (PLETAL) 50 MG tablet Take 1 tablet (50 mg total) by mouth 2 (two) times daily.   esomeprazole (NEXIUM) 20 MG capsule Take 20 mg by mouth daily at 12 noon.   FLUoxetine (PROZAC) 20 MG tablet Take 1 tablet (20 mg total) by mouth daily.   fluticasone (FLOVENT HFA) 220 MCG/ACT inhaler Inhale 2 puffs into the lungs 2 (two) times daily.   gabapentin (NEURONTIN) 600 MG tablet Take 1 tablet (600 mg total) by mouth 3 (three) times daily.   mexiletine (MEXITIL) 250 MG capsule Take 1 capsule (250 mg total) by mouth 2 (two) times daily.   oxyCODONE-acetaminophen (PERCOCET) 10-325 MG tablet Take 1 tablet by mouth 4 (four) times daily.   rosuvastatin (CRESTOR) 5 MG tablet  Take 1 tablet (5 mg total) by mouth daily.   sildenafil (VIAGRA) 25 MG tablet TAKE 2 TO 3 TABLETS BY MOUTH 30 MINUTES TO 4 HOURS PRIOR TO SEXUAL ACTIVITY   warfarin (COUMADIN) 6 MG tablet Take 1 tablet (6 mg total) by mouth daily.   [DISCONTINUED] oxyCODONE-acetaminophen (PERCOCET) 10-325 MG tablet Take 1 tablet by mouth every 4 (four) hours as needed for pain.   [DISCONTINUED] oxyCODONE-acetaminophen (PERCOCET) 10-325 MG tablet Take 1 tablet by mouth 4 (four) times daily.    Allergies: Patient is allergic to cymbalta [duloxetine hcl]. Past Medical History Patient  has a past medical history of Anticoagulated on Coumadin, Antiphospholipid antibody positive, followed by Dr. 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 Past Surgical History Patient  has a past surgical history that includes Knee surgery (Right, 1983); Cardiac catheterization (N/A, 02/24/2016); Cardiac catheterization (Right, 02/24/2016); Cardiac catheterization (N/A, 05/11/2016); Cardiac catheterization (N/A, 05/11/2016); Esophagogastroduodenoscopy (N/A, 07/08/2016); Colonoscopy (N/A, 07/08/2016); Thrombectomy femoral artery (Right, 07/17/2016); Patch angioplasty (Right, 07/17/2016); Intraoperative arteriogram (Right, 07/17/2016); Angioplasty illiac artery (Right, 07/17/2016); Groin debridement (Right, 07/25/2016); Anterior cervical decomp/discectomy fusion (07/21/2010); transthoracic echocardiogram (06-20-2016   dr hilty); Cardiovascular stress test (06-14-2016   dr hilty); Hemorrhoid surgery (N/A, 12/07/2016); and Posterior cervical fusion/foraminotomy (N/A, 07/05/2018). Family History: Patient family history includes Lung cancer in his father. Social History:  Patient  reports that he has been smoking cigarettes and e-cigarettes. He has a 16.50 pack-year smoking history. He has never used smokeless tobacco. He reports that he does not drink alcohol or use drugs.  Review of Systems: Constitutional: negative for fever or malaise Ophthalmic: negative for photophobia, double vision or loss of vision Cardiovascular: negative for chest pain, dyspnea on exertion, or new LE swelling Respiratory: negative for SOB or persistent cough Gastrointestinal: negative for  abdominal pain, change in bowel habits or melena Genitourinary: negative for dysuria or gross hematuria Musculoskeletal: negative for new gait disturbance or muscular weakness Integumentary: negative for new or persistent rashes Neurological: negative for TIA or stroke symptoms Psychiatric: negative for SI or delusions Allergic/Immunologic: negative for hives  Patient Care Team    Relationship Specialty Notifications Start End  Leamon Arnt, MD PCP - General Family Medicine  04/22/19   Pixie Casino, MD Consulting Physician Cardiology  05/12/16   Brunetta Genera, MD Consulting Physician Hematology  07/11/16   Conrad McKenzie, MD Consulting Physician Vascular Surgery  07/11/16   Earnie Larsson, MD Consulting Physician Neurosurgery  01/14/18   Doran Stabler, MD Consulting Physician Gastroenterology  05/13/18   Constance Haw, MD Consulting Physician Cardiology  04/22/19     Objective  Vitals: BP 100/64 (BP Location: Left Arm, Patient Position: Sitting, Cuff Size: Normal)    Pulse 64    Temp (!) 97.2 F (36.2 C) (Skin)    Ht 5\' 9"  (1.753 m)    Wt 163 lb (73.9 kg)  SpO2 96%    BMI 24.07 kg/m  General:  Well developed, well nourished, no acute distress  Psych:  Alert and oriented,normal mood and affect HEENT:  Normocephalic, atraumatic, non-icteric sclera, PERRL,  Cardiovascular:  RRR without gallop, rub or murmur, nondisplaced PMI Respiratory:  Good breath sounds bilaterally, CTAB with normal respiratory effort   Commons side effects, risks, benefits, and alternatives for medications and treatment plan prescribed today were discussed, and the patient expressed understanding of the given instructions. Patient is instructed to call or message via MyChart if he/she has any questions or concerns regarding our treatment plan. No barriers to understanding were identified. We discussed Red Flag symptoms and signs in detail. Patient expressed understanding regarding what to do in case  of urgent or emergency type symptoms.   Medication list was reconciled, printed and provided to the patient in AVS. Patient instructions and summary information was reviewed with the patient as documented in the AVS. This note was prepared with assistance of Dragon voice recognition software. Occasional wrong-word or sound-a-like substitutions may have occurred due to the inherent limitations of voice recognition software  This visit occurred during the SARS-CoV-2 public health emergency.  Safety protocols were in place, including screening questions prior to the visit, additional usage of staff PPE, and extensive cleaning of exam room while observing appropriate contact time as indicated for disinfecting solutions.

## 2019-04-25 MED FILL — OXYCODONE-APAP 10-325: 10-325 | 30 days supply | Qty: 120 | Fill #0

## 2019-04-29 MED FILL — CARVEDILOL 6.25 MG TABLET: 6.25 | 30 days supply | Qty: 60 | Fill #3

## 2019-04-29 MED FILL — FLUOXETINE HCL 20 MG TABS: 20 | 90 days supply | Qty: 90 | Fill #3

## 2019-04-30 ENCOUNTER — Ambulatory Visit: Payer: 59

## 2019-05-07 ENCOUNTER — Other Ambulatory Visit: Payer: Self-pay

## 2019-05-07 ENCOUNTER — Ambulatory Visit (INDEPENDENT_AMBULATORY_CARE_PROVIDER_SITE_OTHER): Payer: 59 | Admitting: General Practice

## 2019-05-07 ENCOUNTER — Other Ambulatory Visit: Payer: Self-pay | Admitting: General Practice

## 2019-05-07 DIAGNOSIS — D6859 Other primary thrombophilia: Secondary | ICD-10-CM

## 2019-05-07 DIAGNOSIS — Z7901 Long term (current) use of anticoagulants: Secondary | ICD-10-CM

## 2019-05-07 LAB — POCT INR: INR: 2.1 (ref 2.0–3.0)

## 2019-05-07 MED ORDER — WARFARIN SODIUM 6 MG PO TABS: ORAL_TABLET | ORAL | 1 refills | Status: DC

## 2019-05-07 MED FILL — WARFARIN SODIUM 6 MG TABLET: 6 | 90 days supply | Qty: 90 | Fill #0

## 2019-05-07 NOTE — Patient Instructions (Signed)
Pre visit review using our clinic review tool, if applicable. No additional management support is needed unless otherwise documented below in the visit note.  Change dosage to 6 mg daily except take 3 mg on Wednesdays.  Re-check in 4 weeks.

## 2019-05-07 NOTE — Progress Notes (Signed)
I have reviewed the results and agree with this plan   

## 2019-05-13 ENCOUNTER — Other Ambulatory Visit: Payer: Self-pay

## 2019-05-13 DIAGNOSIS — I70211 Atherosclerosis of native arteries of extremities with intermittent claudication, right leg: Secondary | ICD-10-CM

## 2019-05-13 DIAGNOSIS — I779 Disorder of arteries and arterioles, unspecified: Secondary | ICD-10-CM

## 2019-05-19 ENCOUNTER — Telehealth (HOSPITAL_COMMUNITY): Payer: Self-pay | Admitting: *Deleted

## 2019-05-19 MED FILL — MEXILETINE 250 MG CAPSULE: 250 | 30 days supply | Qty: 60 | Fill #5

## 2019-05-19 NOTE — Telephone Encounter (Signed)

## 2019-05-20 ENCOUNTER — Ambulatory Visit (INDEPENDENT_AMBULATORY_CARE_PROVIDER_SITE_OTHER)
Admission: RE | Admit: 2019-05-20 | Discharge: 2019-05-20 | Disposition: A | Discharge status: Home / Self Care | Payer: 59 | Source: Ambulatory Visit | Attending: Vascular Surgery | Admitting: Vascular Surgery

## 2019-05-20 ENCOUNTER — Encounter: Payer: Self-pay | Admitting: Vascular Surgery

## 2019-05-20 ENCOUNTER — Other Ambulatory Visit: Payer: Self-pay

## 2019-05-20 ENCOUNTER — Ambulatory Visit (INDEPENDENT_AMBULATORY_CARE_PROVIDER_SITE_OTHER): Payer: 59 | Admitting: Vascular Surgery

## 2019-05-20 ENCOUNTER — Ambulatory Visit (HOSPITAL_COMMUNITY)
Admission: RE | Admit: 2019-05-20 | Discharge: 2019-05-20 | Disposition: A | Discharge status: Home / Self Care | Payer: 59 | Source: Ambulatory Visit | Attending: Vascular Surgery | Admitting: Vascular Surgery

## 2019-05-20 VITALS — BP 120/80 | HR 52 | Temp 97.7°F | Resp 18 | Ht 70.0 in | Wt 161.0 lb

## 2019-05-20 DIAGNOSIS — I779 Disorder of arteries and arterioles, unspecified: Secondary | ICD-10-CM

## 2019-05-20 DIAGNOSIS — I70211 Atherosclerosis of native arteries of extremities with intermittent claudication, right leg: Secondary | ICD-10-CM | POA: Diagnosis not present

## 2019-05-20 DIAGNOSIS — I700 Atherosclerosis of aorta: Secondary | ICD-10-CM

## 2019-05-20 NOTE — Progress Notes (Signed)
Patient name: [name] MRN: [id number] DOB: Jul 01, 1965 Sex: male  REASON FOR VISIT: Surveillance right common iliac stent  HPI: [name] is a 54 y.o. male that presents for 27-month follow-up for surveillance of his right common iliac stent.  He previously underwent right common iliac angioplasty and stenting by Dr. Bridgett Larsson in 2017.  It was for lifestyle limiting short distance claudication.  He later required thrombectomy of the right common iliac artery as well as right common femoral artery patch angioplasty 2018.  Ultimately on today's follow-up states his legs are doing the same.  He has no lower extremity claudication symptoms, rest pain, or tissue loss.  Does have some pins-and-needles sensation in the right foot that has been present for a long time.  He still smoking.    Past Medical History:  Diagnosis Date  . Anticoagulated on Coumadin   . Antiphospholipid antibody positive, followed by Dr. Irene Limbo, Hematology 06/27/2016  . Anxiety with depression 06/24/2014  . Chronic back pain   . ED (erectile dysfunction)   . GERD (gastroesophageal reflux disease)   . Hemorrhoids, internal, with bleeding   . Hiatal hernia   . History of adenomatous polyp of colon, 07/08/16, tubular adenomas   . History of atrial fibrillation, episode 08/2016   . History of DVT of lower extremity   . Hyperlipidemia   . Iron deficiency anemia due to chronic blood loss, thought to be from hemorrhoids while on coumadin, s/p iron infusion and blood transfusion x 2, 2018 2018  . 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 Nicotine dependence   . OA (osteoarthritis)   . PAD (peripheral artery disease) ([place]), followed by Dr Bridgett Larsson    hx right CIA angioplasty and stent 02-24-2016/right CIA thromectomy and patch angioplasty for restenosis 07-18-2015  . PVCs (premature ventricular contractions)   . 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 S/P insertion of iliac artery stent 2018   02-23-2017  right CIA PTA and stent/05-11-2016 in-stent restenosis s/p thrombectomy/2018 thrombectomy and  patch angioplasty right femoral artery     Past Surgical History:  Procedure Laterality Date  . ANGIOPLASTY ILLIAC ARTERY Right 07/17/2016   Procedure: ANGIOPLASTY RIGHT COMMON ILIAC ARTERY;  Surgeon: Conrad Teton, MD;  Location: [place];  Service: Vascular;  Laterality: Right;  . ANTERIOR CERVICAL DECOMP/DISCECTOMY FUSION  07/21/2010   C5 -- C7  . CARDIOVASCULAR STRESS TEST  06-14-2016   dr hilty   normal perfusion study w/ no reversible ischemia/  stress ef 44% but visually looks better , echo ordered (LVEF 30-44%)  , normal LV wall motion   . COLONOSCOPY N/A 07/08/2016   Procedure: COLONOSCOPY;  Surgeon: Clarene Essex, MD;  [facility];  Service: Endoscopy;  Laterality: N/A;  . ESOPHAGOGASTRODUODENOSCOPY N/A 07/08/2016   Procedure: ESOPHAGOGASTRODUODENOSCOPY (EGD);  Surgeon: Clarene Essex, MD;  Location: [facility];  Service: Endoscopy;  Laterality: N/A;  . GROIN DEBRIDEMENT Right 07/25/2016   Procedure: EVACUATION HEMATOMA RIGHT GROIN;  Surgeon: Waynetta Sandy, MD;  Location: [place];  Service: Vascular;  Laterality: Right;  . HEMORRHOID SURGERY N/A 12/07/2016   Procedure: 3 COLUMN HEMORRHOIDECTOMY;  Surgeon: Leighton Ruff, MD;  Location: [facility];  Service: General;  Laterality: N/A;  . INTRAOPERATIVE ARTERIOGRAM Right 07/17/2016   Procedure: INTRA OPERATIVE ANGIOGRAM OF RIGHT COMMON ILIAC ARTERY;  Surgeon: Conrad [place], MD;  Location: [place];  Service: Vascular;  Laterality: Right;  . KNEE SURGERY Right 1983   cartilage  .  PATCH ANGIOPLASTY Right 07/17/2016   Procedure: PATCH ANGIOPLASTY RIGHT FEMORAL ARTERY USING Rueben Bash BIOLOGIC PATCH;  Surgeon: Conrad [place], MD;  Location: [place];  Service: Vascular;  Laterality: Right;  . PERIPHERAL VASCULAR CATHETERIZATION N/A 02/24/2016   Procedure: Abdominal Aortogram w/Lower Extremity;  Surgeon: Conrad Langeloth, MD;   Location: [place] [facility];  Service: Cardiovascular;  Laterality: N/A;  . PERIPHERAL VASCULAR CATHETERIZATION Right 02/24/2016   Procedure: Peripheral Vascular Intervention;  Surgeon: Conrad Furnas, MD;  Location: [place] [facility];  Service: Cardiovascular;  Laterality: Right;  Common iliac  . PERIPHERAL VASCULAR CATHETERIZATION N/A 05/11/2016   Procedure: Abdominal Aortogram;  Surgeon: Conrad [place], MD;  Location: [place] [facility];  Service: Cardiovascular;  Laterality: N/A;  . PERIPHERAL VASCULAR CATHETERIZATION N/A 05/11/2016   Procedure: Lower Extremity Angiography;  Surgeon: Conrad [place], MD;  Location: [place] [facility];  Service: Cardiovascular;  Laterality: N/A;  . POSTERIOR CERVICAL FUSION/FORAMINOTOMY N/A 07/05/2018   Procedure: Posterior cervical fusion with lateral mass fixation - Cervical five-Cervical six, Cervical six-Cervical seven;  Surgeon: Earnie Larsson, MD;  Location: [place];  Service: Neurosurgery;  Laterality: N/A;  . THROMBECTOMY FEMORAL ARTERY Right 07/17/2016   Procedure: THROMBECTOMY RIGHT FEMORAL ARTERY;  Surgeon: Conrad [place], MD;  Location: [place];  Service: Vascular;  Laterality: Right;  . TRANSTHORACIC ECHOCARDIOGRAM  06-20-2016   dr hilty   EF 55-60%,  grade 1 diastolic dysfunction, mild TR    Family History  Problem Relation Age of Onset  . Lung cancer Father     SOCIAL HISTORY: Social History   Tobacco Use  . Smoking status: Current Every Day Smoker    Packs/day: 0.50    Years: 33.00    Pack years: 16.50    Types: Cigarettes, E-cigarettes  . Smokeless tobacco: Never Used  Substance Use Topics  . Alcohol use: No    Comment: Sober since 2017    Allergies  Allergen Reactions  . Cymbalta [Duloxetine Hcl] Other (See Comments)    Headaches    Current Outpatient Medications  Medication Sig Dispense Refill  . carvedilol (COREG) 6.25 MG tablet Take 1 tablet (6.25 mg total) by mouth 2 (two) times daily. 60 tablet 10  . cilostazol (PLETAL) 50 MG  tablet Take 1 tablet (50 mg total) by mouth 2 (two) times daily. 180 tablet 3  . cyclobenzaprine (FLEXERIL) 10 MG tablet Take 1 tablet (10 mg total) by mouth at bedtime as needed for muscle spasms. 30 tablet 1  . esomeprazole (NEXIUM) 20 MG capsule Take 20 mg by mouth daily at 12 noon.    Marland Kitchen FLUoxetine (PROZAC) 20 MG tablet Take 1 tablet (20 mg total) by mouth daily. 90 tablet 3  . fluticasone (FLOVENT HFA) 220 MCG/ACT inhaler Inhale 2 puffs into the lungs 2 (two) times daily. 3 Inhaler 4  . gabapentin (NEURONTIN) 600 MG tablet Take 1 tablet (600 mg total) by mouth 3 (three) times daily. 270 tablet 3  . mexiletine (MEXITIL) 250 MG capsule Take 1 capsule (250 mg total) by mouth 2 (two) times daily. 60 capsule 6  . oxyCODONE-acetaminophen (PERCOCET) 10-325 MG tablet Take 1 tablet by mouth 4 (four) times daily. 120 tablet 0  . rosuvastatin (CRESTOR) 5 MG tablet Take 1 tablet (5 mg total) by mouth daily. 90 tablet 2  . sildenafil (VIAGRA) 25 MG tablet TAKE 2 TO 3 TABLETS BY MOUTH 30 MINUTES TO 4 HOURS PRIOR TO SEXUAL ACTIVITY 21 tablet 2  . warfarin (COUMADIN)  6 MG tablet Take 1 tablet daily or as directed by anticoagulation clinic 90 tablet 1   No current facility-administered medications for this visit.    REVIEW OF SYSTEMS:  [X]  denotes positive finding, [ ]  denotes negative finding Cardiac  Comments:  Chest pain or chest pressure:    Shortness of breath upon exertion:    Short of breath when lying flat:    Irregular heart rhythm:        Vascular    Pain in calf, thigh, or hip brought on by ambulation:    Pain in feet at night that wakes you up from your sleep:     Blood clot in your veins:    Leg swelling:         Pulmonary    Oxygen at home:    Productive cough:     Wheezing:         Neurologic    Sudden weakness in arms or legs:     Sudden numbness in arms or legs:     Sudden onset of difficulty speaking or slurred speech:    Temporary loss of vision in one eye:     Problems  with dizziness:         Gastrointestinal    Blood in stool:     Vomited blood:         Genitourinary    Burning when urinating:     Blood in urine:        Psychiatric    Major depression:         Hematologic    Bleeding problems:    Problems with blood clotting too easily:        Skin    Rashes or ulcers:        Constitutional    Fever or chills:      PHYSICAL EXAM: Vitals:   05/20/19 1050  BP: 120/80  Pulse: (!) 52  Resp: 18  Temp: 97.7 F (36.5 C)  TempSrc: Temporal  SpO2: 99%  Weight: 161 lb (73 kg)  Height: 5\' 10"  (1.778 m)    GENERAL: The patient is a well-nourished male, in no acute distress. The vital signs are documented above. CARDIAC: There is a regular rate and rhythm.  VASCULAR:  Palpable femoral pulses bilaterally Right posterior tibial palpable left dorsalis pedis palpable PULMONARY: There is good air exchange bilaterally without wheezing or rales. ABDOMEN: Soft and non-tender with normal pitched bowel sounds.  MUSCULOSKELETAL: There are no major deformities or cyanosis. NEUROLOGIC: No focal weakness or paresthesias are detected.   DATA:   ABIs are 1.12 on the right triphasic and 1.2 on the left triphasic  Aortoiliac duplex shows that the distal right common iliac stent has a velocity of 256 and just distal to the stent there is a velocity of 318 suggesting at least moderate stenosis, this is relatively stable over the last 6 months when his velocities were 288 and 307  Assessment/Plan:  53 year old male that presents for follow-up and surveillance.  He previous underwent a right common iliac stent for lifestyle limiting claudication and then a right femoral endarterectomy.  He has no symptoms at this time.  We are watching some stenosis in the right distal stent and then just distal to the stent.  Essentially these velocities are relatively stable over the last 6 months he has a palpable pulse in his foot and is asymptomatic.  Suggested  follow-up again in 6 months with repeat studies.   Gwenyth Allegra.  Carlis Abbott, MD [facility] Office: [phone] Pager: [phone]

## 2019-05-30 ENCOUNTER — Other Ambulatory Visit (HOSPITAL_COMMUNITY): Payer: Self-pay | Admitting: Vascular Surgery

## 2019-05-30 DIAGNOSIS — I739 Peripheral vascular disease, unspecified: Secondary | ICD-10-CM

## 2019-06-02 MED FILL — OXYCODONE-APAP 10-325: 10-325 | 30 days supply | Qty: 120 | Fill #0

## 2019-06-02 MED FILL — CYCLOBENZAPRINE HCL 10 MG T: 10 | 30 days supply | Qty: 30 | Fill #1

## 2019-06-02 MED FILL — CARVEDILOL 6.25 MG TABLET: 6.25 | 30 days supply | Qty: 60 | Fill #4

## 2019-06-04 ENCOUNTER — Other Ambulatory Visit: Payer: Self-pay | Admitting: General Practice

## 2019-06-04 ENCOUNTER — Ambulatory Visit: Payer: 59

## 2019-06-04 DIAGNOSIS — Z7901 Long term (current) use of anticoagulants: Secondary | ICD-10-CM

## 2019-06-10 ENCOUNTER — Other Ambulatory Visit (INDEPENDENT_AMBULATORY_CARE_PROVIDER_SITE_OTHER): Payer: 59

## 2019-06-10 ENCOUNTER — Other Ambulatory Visit: Payer: Self-pay

## 2019-06-10 DIAGNOSIS — Z7901 Long term (current) use of anticoagulants: Secondary | ICD-10-CM | POA: Diagnosis not present

## 2019-06-10 LAB — PROTIME-INR
INR: 3.1 ratio — ABNORMAL HIGH (ref 0.8–1.0)
Prothrombin Time: 35 s — ABNORMAL HIGH (ref 9.6–13.1)

## 2019-06-12 ENCOUNTER — Other Ambulatory Visit: Payer: Self-pay

## 2019-06-12 ENCOUNTER — Ambulatory Visit (INDEPENDENT_AMBULATORY_CARE_PROVIDER_SITE_OTHER): Payer: 59 | Admitting: Cardiology

## 2019-06-12 ENCOUNTER — Encounter: Payer: Self-pay | Admitting: Cardiology

## 2019-06-12 VITALS — BP 142/72 | HR 65 | Ht 70.0 in | Wt 165.4 lb

## 2019-06-12 DIAGNOSIS — I493 Ventricular premature depolarization: Secondary | ICD-10-CM | POA: Diagnosis not present

## 2019-06-12 MED ORDER — MEXILETINE HCL 150 MG PO CAPS: 300.0000 mg | ORAL_CAPSULE | Freq: Two times a day (BID) | ORAL | 6 refills | Status: DC

## 2019-06-12 MED FILL — MEXILETINE 150 MG CAPSULE: 150 | 30 days supply | Qty: 120 | Fill #0

## 2019-06-12 NOTE — Patient Instructions (Addendum)
Medication Instructions:  Your physician has recommended you make the following change in your medication:  1. INCREASE Mexiletine to 300 mg twice a day  * If you need a refill on your cardiac medications before your next appointment, please call your pharmacy.   Labwork: None ordered If you have labs (blood work) drawn today and your tests are completely normal, you will receive your results only by:  [place] (if you have MyChart) OR  A paper copy in the mail If you have any lab test that is abnormal or we need to change your treatment, we will call you to review the results.  Testing/Procedures: Your physician has requested that you have an echocardiogram in 6 months (prior to your follow up with Dr. Curt Bears) . Echocardiography is a painless test that uses sound waves to create images of your heart. It provides your doctor with information about the size and shape of your heart and how well your heart's chambers and valves are working. This procedure takes approximately one hour. There are no restrictions for this procedure.  Follow-Up: At [facility], you and your health needs are our priority.  As part of our continuing mission to provide you with exceptional heart care, we have created designated Provider Care Teams.  These Care Teams include your primary Cardiologist (physician) and Advanced Practice Providers (APPs -  Physician Assistants and Nurse Practitioners) who all work together to provide you with the care you need, when you need it.  You will need a follow up appointment in 6 months with Dr. Curt Bears (after your echocardiogram has been completed).  Please call our office 2 months in advance to schedule this appointment.  You may see Dr Curt Bears or one of the following Advanced Practice Providers on your designated Care Team:    Chanetta Marshall, NP  Tommye Standard, PA-C  Oda Kilts, [place]  Thank you for choosing [facility]!!   Trinidad Curet, RN [phone]  Any Other Special Instructions Will Be Listed Below (If Applicable).

## 2019-06-12 NOTE — Progress Notes (Signed)
Electrophysiology Office Note   Date:  06/12/2019   ID:  [name], DOB Feb 05, 1966, MRN [id number]  PCP:  Leamon Arnt, MD  Cardiologist:  Debara Pickett Primary Electrophysiologist:  Sarika Baldini Meredith Leeds, MD    Chief Complaint: PVC   History of Present Illness: [name] is a 54 y.o. male who is being seen today for the evaluation of PVC at the request of Briscoe Deutscher, DO. Presenting today for electrophysiology evaluation.  He has a history of antiphospholipid antibody syndrome on Coumadin, nonischemic cardiomyopathy, nonobstructive coronary artery disease, and 23% PVCs found on cardiac monitor.  He is currently on mexiletine for his PVCs.  Fortunately his ejection fraction has normalized.  Today, he denies symptoms of chest pain, shortness of breath, orthopnea, PND, lower extremity edema, claudication, dizziness, presyncope, syncope, bleeding, or neurologic sequela. The patient is tolerating medications without difficulties.  He continues to have episodic palpitations.  He is unsure how often he is having PVCs.  He has been quadrigeminy today, no on auscultation he is in bigeminy or trigeminy.  He has some mild palpitations and intermittent chest pains, but otherwise is felt well without restriction.   Past Medical History:  Diagnosis Date  . Anticoagulated on Coumadin   . Antiphospholipid antibody positive, followed by Dr. Irene Limbo, Hematology 06/27/2016  . Anxiety with depression 06/24/2014  . Chronic back pain   . ED (erectile dysfunction)   . GERD (gastroesophageal reflux disease)   . Hemorrhoids, internal, with bleeding   . Hiatal hernia   . History of adenomatous polyp of colon, 07/08/16, tubular adenomas   . History of atrial fibrillation, episode 08/2016   . History of DVT of lower extremity   . Hyperlipidemia   . Iron deficiency anemia due to chronic blood loss, thought to be from hemorrhoids while on coumadin, s/p iron infusion and blood transfusion x 2, 2018 2018  .  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 Nicotine dependence   . OA (osteoarthritis)   . PAD (peripheral artery disease) ([place]), followed by Dr Bridgett Larsson    hx right CIA angioplasty and stent 02-24-2016/right CIA thromectomy and patch angioplasty for restenosis 07-18-2015  . PVCs (premature ventricular contractions)   . 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 S/P insertion of iliac artery stent 2018   02-23-2017  right CIA PTA and stent/05-11-2016 in-stent restenosis s/p thrombectomy/2018 thrombectomy and  patch angioplasty right femoral artery    Past Surgical History:  Procedure Laterality Date  . ANGIOPLASTY ILLIAC ARTERY Right 07/17/2016   Procedure: ANGIOPLASTY RIGHT COMMON ILIAC ARTERY;  Surgeon: Conrad Omer, MD;  Location: [place];  Service: Vascular;  Laterality: Right;  . ANTERIOR CERVICAL DECOMP/DISCECTOMY FUSION  07/21/2010   C5 -- C7  . CARDIOVASCULAR STRESS TEST  06-14-2016   dr hilty   normal perfusion study w/ no reversible ischemia/  stress ef 44% but visually looks better , echo ordered (LVEF 30-44%)  , normal LV wall motion   . COLONOSCOPY N/A 07/08/2016   Procedure: COLONOSCOPY;  Surgeon: Clarene Essex, MD;  [facility];  Service: Endoscopy;  Laterality: N/A;  . ESOPHAGOGASTRODUODENOSCOPY N/A 07/08/2016   Procedure: ESOPHAGOGASTRODUODENOSCOPY (EGD);  Surgeon: Clarene Essex, MD;  [facility];  Service: Endoscopy;  Laterality: N/A;  . GROIN DEBRIDEMENT Right 07/25/2016   Procedure: EVACUATION HEMATOMA RIGHT GROIN;  Surgeon: Waynetta Sandy, MD;  Location: [place];  Service: Vascular;  Laterality: Right;  . HEMORRHOID SURGERY N/A 12/07/2016   Procedure:  3 COLUMN HEMORRHOIDECTOMY;  Surgeon: Leighton Ruff, MD;  Location: [facility];  Service: General;  Laterality: N/A;  . INTRAOPERATIVE ARTERIOGRAM Right 07/17/2016   Procedure: INTRA OPERATIVE ANGIOGRAM OF RIGHT COMMON ILIAC ARTERY;  Surgeon: Conrad Waur[place], MD;   Location: [place];  Service: Vascular;  Laterality: Right;  . KNEE SURGERY Right 1983   cartilage  . PATCH ANGIOPLASTY Right 07/17/2016   Procedure: PATCH ANGIOPLASTY RIGHT FEMORAL ARTERY USING Rueben Bash BIOLOGIC PATCH;  Surgeon: Conrad Kincaid, MD;  Location: [place];  Service: Vascular;  Laterality: Right;  . PERIPHERAL VASCULAR CATHETERIZATION N/A 02/24/2016   Procedure: Abdominal Aortogram w/Lower Extremity;  Surgeon: Conrad Richfield, MD;  Location: [place] [facility];  Service: Cardiovascular;  Laterality: N/A;  . PERIPHERAL VASCULAR CATHETERIZATION Right 02/24/2016   Procedure: Peripheral Vascular Intervention;  Surgeon: Conrad Crescent, MD;  Location: [place] [facility];  Service: Cardiovascular;  Laterality: Right;  Common iliac  . PERIPHERAL VASCULAR CATHETERIZATION N/A 05/11/2016   Procedure: Abdominal Aortogram;  Surgeon: Conrad Ecru, MD;  Location: [place] [facility];  Service: Cardiovascular;  Laterality: N/A;  . PERIPHERAL VASCULAR CATHETERIZATION N/A 05/11/2016   Procedure: Lower Extremity Angiography;  Surgeon: Conrad [place], MD;  Location: [place] [facility];  Service: Cardiovascular;  Laterality: N/A;  . POSTERIOR CERVICAL FUSION/FORAMINOTOMY N/A 07/05/2018   Procedure: Posterior cervical fusion with lateral mass fixation - Cervical five-Cervical six, Cervical six-Cervical seven;  Surgeon: Earnie Larsson, MD;  Location: [place];  Service: Neurosurgery;  Laterality: N/A;  . THROMBECTOMY FEMORAL ARTERY Right 07/17/2016   Procedure: THROMBECTOMY RIGHT FEMORAL ARTERY;  Surgeon: Conrad Decherd, MD;  Location: [place];  Service: Vascular;  Laterality: Right;  . TRANSTHORACIC ECHOCARDIOGRAM  06-20-2016   dr hilty   EF 55-60%,  grade 1 diastolic dysfunction, mild TR     Current Outpatient Medications  Medication Sig Dispense Refill  . carvedilol (COREG) 6.25 MG tablet Take 1 tablet (6.25 mg total) by mouth 2 (two) times daily. 60 tablet 10  . cilostazol (PLETAL) 50 MG tablet Take 1 tablet (50 mg total) by  mouth 2 (two) times daily. 180 tablet 3  . cyclobenzaprine (FLEXERIL) 10 MG tablet Take 1 tablet (10 mg total) by mouth at bedtime as needed for muscle spasms. 30 tablet 1  . esomeprazole (NEXIUM) 20 MG capsule Take 20 mg by mouth daily at 12 noon.    Marland Kitchen FLUoxetine (PROZAC) 20 MG tablet Take 1 tablet (20 mg total) by mouth daily. 90 tablet 3  . fluticasone (FLOVENT HFA) 220 MCG/ACT inhaler Inhale 2 puffs into the lungs 2 (two) times daily. 3 Inhaler 4  . gabapentin (NEURONTIN) 600 MG tablet Take 1 tablet (600 mg total) by mouth 3 (three) times daily. 270 tablet 3  . rosuvastatin (CRESTOR) 5 MG tablet Take 1 tablet (5 mg total) by mouth daily. 90 tablet 2  . sildenafil (VIAGRA) 25 MG tablet TAKE 2 TO 3 TABLETS BY MOUTH 30 MINUTES TO 4 HOURS PRIOR TO SEXUAL ACTIVITY 21 tablet 2  . warfarin (COUMADIN) 6 MG tablet Take 1 tablet daily or as directed by anticoagulation clinic 90 tablet 1  . mexiletine (MEXITIL) 150 MG capsule Take 2 capsules (300 mg total) by mouth 2 (two) times daily. 120 capsule 6   No current facility-administered medications for this visit.    Allergies:   Cymbalta [duloxetine hcl]   Social History:  The patient  reports that he has been smoking cigarettes and e-cigarettes. He has  a 16.50 pack-year smoking history. He has never used smokeless tobacco. He reports that he does not drink alcohol or use drugs.   Family History:  The patient's family history includes Lung cancer in his father.    ROS:  Please see the history of present illness.   Otherwise, review of systems is positive for none.   All other systems are reviewed and negative.    PHYSICAL EXAM: VS:  BP (!) 142/72   Pulse 65   Ht 5\' 10"  (1.778 m)   Wt 165 lb 6.4 oz (75 kg)   SpO2 99%   BMI 23.73 kg/m  , BMI Body mass index is 23.73 kg/m. GEN: Well nourished, well developed, in no acute distress  HEENT: normal  Neck: no JVD, carotid bruits, or masses Cardiac: RRR; no murmurs, rubs, or gallops,no edema    Respiratory:  clear to auscultation bilaterally, normal work of breathing GI: soft, nontender, nondistended, + BS MS: no deformity or atrophy  Skin: warm and dry Neuro:  Strength and sensation are intact Psych: euthymic mood, full affect  EKG:  EKG is ordered today. Personal review of the ekg ordered shows sinus rhythm, PVCs  Recent Labs: 01/30/2019: ALT 23; BUN 8; Creatinine, Ser 0.86; Magnesium 2.0; Potassium 3.9; Sodium 138 04/22/2019: Hemoglobin 14.8; Platelets 279.0    Lipid Panel     Component Value Date/Time   CHOL 136 01/30/2019 0855   TRIG 121.0 01/30/2019 0855   HDL 31.70 (L) 01/30/2019 0855   CHOLHDL 4 01/30/2019 0855   VLDL 24.2 01/30/2019 0855   LDLCALC 80 01/30/2019 0855     Wt Readings from Last 3 Encounters:  06/12/19 165 lb 6.4 oz (75 kg)  05/20/19 161 lb (73 kg)  04/22/19 163 lb (73.9 kg)      Other studies Reviewed: Additional studies/ records that were reviewed today include: TTE 12/02/2018 Review of the above records today demonstrates:   1. The left ventricle has normal systolic function with an ejection fraction of 60-65%. The cavity size was normal. Left ventricular diastolic parameters were normal.  2. The right ventricle has normal systolic function. The cavity was normal. There is no increase in right ventricular wall thickness.  3. The mitral valve is grossly normal.  4. The aortic valve is tricuspid. Mild thickening of the aortic valve.   ASSESSMENT AND PLAN:  1.  PVCs: Currently on mexiletine.  He has continued to have PVCs despite mexiletine.  We Quadir Muns plan to increase mexiletine and see if this helps to improve his symptoms.  He does have antiphospholipid antibody syndrome and is on Coumadin.  We Jeris Roser try to avoid invasive procedures as he would likely need to be bridged.  We Marirose Deveney get an echo in 6 months to determine if his ejection fraction has remained stable.  2.  Nonischemic cardiomyopathy: Fortunately ejection fraction has normalized  to 60 to 65%.  Plan per primary cardiology.    Current medicines are reviewed at length with the patient today.   The patient does not have concerns regarding his medicines.  The following changes were made today: Increase mexiletine  Labs/ tests ordered today include:  Orders Placed This Encounter  Procedures  . EKG 12-Lead  . ECHOCARDIOGRAM COMPLETE     Disposition:   FU with Rozanna Cormany 6 months  Signed, Lennyx Verdell Meredith Leeds, MD  06/12/2019 10:48 AM     [facility] [address] [place] [address] [phone] (office) [phone] (fax)

## 2019-06-23 ENCOUNTER — Other Ambulatory Visit: Payer: Self-pay | Admitting: General Practice

## 2019-06-23 DIAGNOSIS — Z7901 Long term (current) use of anticoagulants: Secondary | ICD-10-CM

## 2019-06-30 MED FILL — CARVEDILOL 6.25 MG TABLET: 6.25 | 30 days supply | Qty: 60 | Fill #5

## 2019-07-11 ENCOUNTER — Other Ambulatory Visit: Payer: Self-pay | Admitting: Family Medicine

## 2019-07-11 MED FILL — CYCLOBENZAPRINE HCL 10 MG T: 10 | 90 days supply | Qty: 90 | Fill #0

## 2019-07-15 ENCOUNTER — Other Ambulatory Visit: Payer: Self-pay

## 2019-07-16 ENCOUNTER — Ambulatory Visit (INDEPENDENT_AMBULATORY_CARE_PROVIDER_SITE_OTHER): Payer: 59 | Admitting: General Practice

## 2019-07-16 ENCOUNTER — Other Ambulatory Visit: Payer: 59

## 2019-07-16 ENCOUNTER — Other Ambulatory Visit: Payer: Self-pay

## 2019-07-16 DIAGNOSIS — Z7901 Long term (current) use of anticoagulants: Secondary | ICD-10-CM

## 2019-07-16 LAB — POCT INR: INR: 2.8 (ref 2.0–3.0)

## 2019-07-16 NOTE — Patient Instructions (Signed)
Pre visit review using our clinic review tool, if applicable. No additional management support is needed unless otherwise documented below in the visit note.  Continue to take  to 6 mg daily except take 3 mg on Wednesdays.  Re-check in 6 weeks.

## 2019-07-18 MED FILL — MEXILETINE 150 MG CAPSULE: 150 | 30 days supply | Qty: 120 | Fill #1

## 2019-07-20 ENCOUNTER — Encounter: Payer: Self-pay | Admitting: Family Medicine

## 2019-07-21 ENCOUNTER — Other Ambulatory Visit: Payer: Self-pay

## 2019-07-21 MED ORDER — FLUOXETINE HCL 20 MG PO TABS: 20.0000 mg | ORAL_TABLET | Freq: Every day | ORAL | 3 refills | Status: DC

## 2019-07-21 MED FILL — CILOSTAZOL 50 MG TABLET: 50 | 90 days supply | Qty: 180 | Fill #3

## 2019-07-21 MED FILL — FLUOXETINE HCL 20 MG TABS: 20 | 90 days supply | Qty: 90 | Fill #0

## 2019-07-21 MED FILL — ROSUVASTATIN CALCIUM 5 MG T: 5 | 90 days supply | Qty: 90 | Fill #1

## 2019-07-27 MED FILL — CARVEDILOL 6.25 MG TABLET: 6.25 | 30 days supply | Qty: 60 | Fill #6

## 2019-08-11 MED FILL — WARFARIN SODIUM 6 MG TABLET: 6 | 90 days supply | Qty: 90 | Fill #1

## 2019-08-11 MED FILL — MEXILETINE 150 MG CAPSULE: 150 | 30 days supply | Qty: 120 | Fill #2

## 2019-08-27 ENCOUNTER — Ambulatory Visit (INDEPENDENT_AMBULATORY_CARE_PROVIDER_SITE_OTHER): Payer: 59 | Admitting: General Practice

## 2019-08-27 ENCOUNTER — Other Ambulatory Visit: Payer: Self-pay

## 2019-08-27 ENCOUNTER — Ambulatory Visit: Payer: 59

## 2019-08-27 DIAGNOSIS — Z7901 Long term (current) use of anticoagulants: Secondary | ICD-10-CM

## 2019-08-27 LAB — POCT INR: INR: 1.7 — AB (ref 2.0–3.0)

## 2019-08-27 NOTE — Progress Notes (Signed)
I have reviewed the results and agree with this plan   

## 2019-08-27 NOTE — Patient Instructions (Signed)
Pre visit review using our clinic review tool, if applicable. No additional management support is needed unless otherwise documented below in the visit note.  Take 1 tablet today and take 1 1/2 tablets tomorrow.  On Friday continue to take  to 6 mg daily except take 3 mg on Wednesdays.  Re-check in 4 weeks.

## 2019-08-31 MED FILL — CARVEDILOL 6.25 MG TABLET: 6.25 | 30 days supply | Qty: 60 | Fill #7

## 2019-09-08 MED FILL — GABAPENTIN 600 MG TABLET: 600 | 90 days supply | Qty: 270 | Fill #1

## 2019-09-15 MED FILL — MEXILETINE 150 MG CAPSULE: 150 | 30 days supply | Qty: 120 | Fill #3

## 2019-09-18 ENCOUNTER — Other Ambulatory Visit: Payer: Self-pay | Admitting: General Practice

## 2019-09-18 DIAGNOSIS — Z7901 Long term (current) use of anticoagulants: Secondary | ICD-10-CM

## 2019-09-19 ENCOUNTER — Other Ambulatory Visit: Payer: Self-pay

## 2019-09-19 ENCOUNTER — Other Ambulatory Visit (INDEPENDENT_AMBULATORY_CARE_PROVIDER_SITE_OTHER): Payer: 59

## 2019-09-19 DIAGNOSIS — Z7901 Long term (current) use of anticoagulants: Secondary | ICD-10-CM

## 2019-09-19 LAB — PROTIME-INR
INR: 1.7 ratio — ABNORMAL HIGH (ref 0.8–1.0)
Prothrombin Time: 19 s — ABNORMAL HIGH (ref 9.6–13.1)

## 2019-09-22 ENCOUNTER — Ambulatory Visit: Payer: 59

## 2019-09-22 ENCOUNTER — Ambulatory Visit (INDEPENDENT_AMBULATORY_CARE_PROVIDER_SITE_OTHER): Payer: 59 | Admitting: General Practice

## 2019-09-22 DIAGNOSIS — Z7901 Long term (current) use of anticoagulants: Secondary | ICD-10-CM | POA: Diagnosis not present

## 2019-09-22 NOTE — Patient Instructions (Signed)
Pre visit review using our clinic review tool, if applicable. No additional management support is needed unless otherwise documented below in the visit note. 

## 2019-10-06 MED FILL — CARVEDILOL 6.25 MG TABLET: 6.25 | 30 days supply | Qty: 60 | Fill #8

## 2019-10-13 ENCOUNTER — Encounter: Payer: Self-pay | Admitting: Family Medicine

## 2019-10-13 ENCOUNTER — Other Ambulatory Visit: Payer: Self-pay | Admitting: Family Medicine

## 2019-10-13 ENCOUNTER — Ambulatory Visit (INDEPENDENT_AMBULATORY_CARE_PROVIDER_SITE_OTHER): Payer: 59 | Admitting: Family Medicine

## 2019-10-13 ENCOUNTER — Other Ambulatory Visit: Payer: Self-pay

## 2019-10-13 VITALS — BP 122/62 | HR 64 | Temp 97.8°F | Resp 18 | Ht 70.0 in | Wt 172.6 lb

## 2019-10-13 DIAGNOSIS — R760 Raised antibody titer: Secondary | ICD-10-CM

## 2019-10-13 DIAGNOSIS — D751 Secondary polycythemia: Secondary | ICD-10-CM | POA: Diagnosis not present

## 2019-10-13 DIAGNOSIS — E785 Hyperlipidemia, unspecified: Secondary | ICD-10-CM | POA: Diagnosis not present

## 2019-10-13 DIAGNOSIS — F17210 Nicotine dependence, cigarettes, uncomplicated: Secondary | ICD-10-CM

## 2019-10-13 DIAGNOSIS — J431 Panlobular emphysema: Secondary | ICD-10-CM | POA: Diagnosis not present

## 2019-10-13 DIAGNOSIS — G8929 Other chronic pain: Secondary | ICD-10-CM | POA: Diagnosis not present

## 2019-10-13 DIAGNOSIS — M5441 Lumbago with sciatica, right side: Secondary | ICD-10-CM

## 2019-10-13 DIAGNOSIS — I493 Ventricular premature depolarization: Secondary | ICD-10-CM

## 2019-10-13 MED ORDER — BREZTRI AEROSPHERE 160-9-4.8 MCG/ACT IN AERO: 2.0000 | INHALATION_SPRAY | Freq: Two times a day (BID) | RESPIRATORY_TRACT | 5 refills | Status: DC

## 2019-10-13 MED ORDER — FLOVENT HFA 220 MCG/ACT IN AERO: 2.0000 | INHALATION_SPRAY | Freq: Two times a day (BID) | RESPIRATORY_TRACT | 4 refills | Status: DC

## 2019-10-13 MED FILL — MEXILETINE HCL 150 MG CAPS: 150 | 30 days supply | Qty: 120 | Fill #4

## 2019-10-13 MED FILL — BREZTRI AEROSPHERE 160-9-4.: 160-9-4.8 | 30 days supply | Qty: 11 | Fill #0

## 2019-10-13 NOTE — Patient Instructions (Signed)
Please return in 3 months for your annual complete physical; please come fasting.  I will release your lab results to you on your MyChart account with further instructions. Please reply with any questions.   Please try the new inhaler Breztri. Do not refill the flovent that was sent in for you.  If you have any questions or concerns, please don't hesitate to send me a message via MyChart or call the office at [phone]. Thank you for visiting with [place] today! It's our pleasure caring for you.

## 2019-10-14 ENCOUNTER — Encounter: Payer: Self-pay | Admitting: Family Medicine

## 2019-10-14 LAB — COMPREHENSIVE METABOLIC PANEL
ALT: 24 U/L (ref 0–53)
AST: 22 U/L (ref 0–37)
Albumin: 4.3 g/dL (ref 3.5–5.2)
Alkaline Phosphatase: 108 U/L (ref 39–117)
BUN: 7 mg/dL (ref 6–23)
CO2: 26 mEq/L (ref 19–32)
Calcium: 9 mg/dL (ref 8.4–10.5)
Chloride: 103 mEq/L (ref 96–112)
Creatinine, Ser: 0.95 mg/dL (ref 0.40–1.50)
GFR: 83.17 mL/min (ref >60.00)
Glucose, Bld: 86 mg/dL (ref 70–99)
Potassium: 4.1 mEq/L (ref 3.5–5.1)
Sodium: 137 mEq/L (ref 135–145)
Total Bilirubin: 0.5 mg/dL (ref 0.2–1.2)
Total Protein: 6.9 g/dL (ref 6.0–8.3)

## 2019-10-14 LAB — CBC WITH DIFFERENTIAL/PLATELET
Basophils Absolute: 0.2 10*3/uL — ABNORMAL HIGH (ref 0.0–0.1)
Basophils Relative: 1.1 % (ref 0.0–3.0)
Eosinophils Absolute: 0.7 10*3/uL (ref 0.0–0.7)
Eosinophils Relative: 5.1 % — ABNORMAL HIGH (ref 0.0–5.0)
HCT: 45.1 % (ref 39.0–52.0)
Hemoglobin: 15.2 g/dL (ref 13.0–17.0)
Lymphocytes Relative: 20.1 % (ref 12.0–46.0)
Lymphs Abs: 2.9 10*3/uL (ref 0.7–4.0)
MCHC: 33.8 g/dL (ref 30.0–36.0)
MCV: 98.9 fl (ref 78.0–100.0)
Monocytes Absolute: 1 10*3/uL (ref 0.1–1.0)
Monocytes Relative: 6.5 % (ref 3.0–12.0)
Neutro Abs: 9.8 10*3/uL — ABNORMAL HIGH (ref 1.4–7.7)
Neutrophils Relative %: 67.2 % (ref 43.0–77.0)
Platelets: 212 10*3/uL (ref 150.0–400.0)
RBC: 4.57 Mil/uL (ref 4.22–5.81)
RDW: 13.4 % (ref 11.5–15.5)
WBC: 14.6 10*3/uL — ABNORMAL HIGH (ref 4.0–10.5)

## 2019-10-14 LAB — IRON,TIBC AND FERRITIN PANEL
%SAT: 25 % (calc) (ref 20–48)
Ferritin: 85 ng/mL (ref 38–380)
Iron: 82 ug/dL (ref 50–180)
TIBC: 322 mcg/dL (calc) (ref 250–425)

## 2019-10-14 LAB — TSH: TSH: 3.11 u[IU]/mL (ref 0.35–4.50)

## 2019-10-14 NOTE — Addendum Note (Signed)
Addended by: Francis Dowse T on: 10/14/2019 11:19 AM   Modules accepted: Orders

## 2019-10-14 NOTE — Progress Notes (Signed)
Subjective  CC:  Chief Complaint  Patient presents with  . Chronic pain disorder    Flexeril is helping his neck pain. Refill pending.   . Nasal Congestion    He would like to discuss getting a refill on a inhaler.     HPI: [name] is a 54 y.o. male who presents to the office today to address the problems listed above in the chief complaint.  Chronic back pain: pt weaned himself off of pain meds. Now using prn advil with some relief. Declines pain mgt at this time. No new sxs. Flexeril nightly helps with neck pain. Understands risks of narcotic use given h/o alcohol addictions. Risks outweigh benefits at this time.   hypercoaguable state on anticoagulation, due PT/INR. No bleeding or sxs of clot  HLD on crestor with good compliance  Smoker: not willing to think about quitting at this time. Copd: admits to wheeze and occ sob now. Reviewed xrays and Chest CT reports in chart with emphysematous changes. No PFTs noted. Had been on flovent in past with benefit but ran out. Doesn't have a rescue inhaler. No sxs of infection at this time  polycythemai and h/o iron deficiency: due for recheck. Has been evaluated by heme.    Assessment  1. Chronic right-sided low back pain with right-sided sciatica   2. Frequent PVCs   3. Antiphospholipid antibody positive, requires lifetime Coumadin   4. Dyslipidemia, Rx Crestor   5. Cigarette nicotine dependence without complication   6. Polycythemia   7. Panlobular emphysema ([place])      Plan   pain:  Behavioral mgt, prn advil, mm relaxer and monitor. Agree with avoidance of narcotics.  H/o arrhytmia on meds per cards  Copd: start breztri and prn albuterol. Smoking exacerbates condition; pt aware and precontemplative  Recheck lipids and blood counts today.   Reviewed meds: prozac for anxiety, gabapentin, pletal and coumadin for APL syndrome and PAD  Follow up: 3 months for cpe with labs.  Visit date not found  Orders Placed This  Encounter  Procedures  . CBC with Differential/Platelet  . Iron, TIBC and Ferritin Panel  . Comprehensive metabolic panel  . TSH  . Protime-INR   Meds ordered this encounter  Medications  . DISCONTD: fluticasone (FLOVENT HFA) 220 MCG/ACT inhaler    Sig: Inhale 2 puffs into the lungs 2 (two) times daily.    Dispense:  3 Inhaler    Refill:  4  . Budeson-Glycopyrrol-Formoterol (BREZTRI AEROSPHERE) 160-9-4.8 MCG/ACT AERO    Sig: Inhale 2 puffs into the lungs in the morning and at bedtime.    Dispense:  10.7 g    Refill:  5      I reviewed the patients updated PMH, FH, and SocHx.    Patient Active Problem List   Diagnosis Date Noted  . Chronic narcotic dependence ([place]) 04/22/2019    Priority: High  . Polycythemia 02/02/2019    Priority: High  . Pulmonary emphysema ([place]), still smoking 08/04/2018    Priority: High  . Chronic pain disorder 08/04/2018    Priority: High  . Chronic right-sided low back pain with right-sided sciatica 11/19/2016    Priority: High  . Long term (current) use of anticoagulants [Z79.01] 09/15/2016    Priority: High  . Dyslipidemia, Rx Crestor 06/27/2016    Priority: High  . Antiphospholipid antibody positive, requires lifetime Coumadin 06/27/2016    Priority: High  . PAOD (peripheral arterrial occlusion disease) ([place]), s/p R CIA PTA+S on 02/24/16 by  Dr. Bridgett Larsson, on Coumadin, Pletal, and Gabapentin 06/02/2016    Priority: High  . Nicotine dependence 06/24/2014    Priority: High  . Cervical pseudoarthrosis ([place]), s/p C5-C7 posterior cervical fusion 06/2018, Dr. Annette Stable 07/05/2018    Priority: Medium  . History of alcohol abuse, sober since 04/2016 07/11/2016    Priority: Medium  . Gastroesophageal reflux disease, with small hiatal hernia, Rx Nexium     Priority: Medium  . Frequent PVCs 02/02/2019    Priority: Low  . Degenerative arthritis of right knee 09/06/2017    Priority: Low  . History of neutrophilia, borderline, likely reactive, discussed with  Hematology 2019 06/18/2017    Priority: Low  . Erectile dysfunction 11/19/2016    Priority: Low  . Hx of iron deficiency anemia due to chronic blood loss, hemorrhoids while on coumadin, s/p hemorrhoidectomy 11/2016     Priority: Low  . Anxiety disorder 02/02/2019  . Depression 02/02/2019  . Arthritis of left acromioclavicular joint 09/29/2016   Current Meds  Medication Sig  . carvedilol (COREG) 6.25 MG tablet Take 1 tablet (6.25 mg total) by mouth 2 (two) times daily.  . cilostazol (PLETAL) 50 MG tablet Take 1 tablet (50 mg total) by mouth 2 (two) times daily.  . cyclobenzaprine (FLEXERIL) 10 MG tablet TAKE 1 TABLET BY MOUTH AT BEDTIME AS NEEDED FOR MUSCLE SPASMS  . esomeprazole (NEXIUM) 20 MG capsule Take 20 mg by mouth daily at 12 noon.  Marland Kitchen FLUoxetine (PROZAC) 20 MG tablet Take 1 tablet (20 mg total) by mouth daily.  Marland Kitchen gabapentin (NEURONTIN) 600 MG tablet Take 1 tablet (600 mg total) by mouth 3 (three) times daily.  Marland Kitchen mexiletine (MEXITIL) 150 MG capsule Take 2 capsules (300 mg total) by mouth 2 (two) times daily.  . rosuvastatin (CRESTOR) 5 MG tablet Take 1 tablet (5 mg total) by mouth daily.  Marland Kitchen warfarin (COUMADIN) 6 MG tablet Take 1 tablet daily or as directed by anticoagulation clinic  . [DISCONTINUED] fluticasone (FLOVENT HFA) 220 MCG/ACT inhaler Inhale 2 puffs into the lungs 2 (two) times daily.  . [DISCONTINUED] fluticasone (FLOVENT HFA) 220 MCG/ACT inhaler Inhale 2 puffs into the lungs 2 (two) times daily.    Allergies: Patient is allergic to cymbalta [duloxetine hcl]. Family History: Patient family history includes Lung cancer in his father. Social History:  Patient  reports that he has been smoking cigarettes and e-cigarettes. He has a 16.50 pack-year smoking history. He has never used smokeless tobacco. He reports that he does not drink alcohol or use drugs.  Review of Systems: Constitutional: Negative for fever malaise or anorexia Cardiovascular: negative for chest  pain Respiratory: negative for SOB or persistent cough Gastrointestinal: negative for abdominal pain  Objective  Vitals: BP 122/62   Pulse 64   Temp 97.8 F (36.6 C) (Temporal)   Resp 18   Ht 5\' 10"  (1.778 m)   Wt 172 lb 9.6 oz (78.3 kg)   SpO2 97%   BMI 24.77 kg/m  General: no acute distress , A&Ox3 HEENT: PEERL, conjunctiva normal, neck is supple Cardiovascular:  RRR without murmur or gallop.  Respiratory:  Good breath sounds bilaterally, CTAB with normal respiratory effort, no wheezing Skin:  Warm, no rashes Distal pulses +2 today     Commons side effects, risks, benefits, and alternatives for medications and treatment plan prescribed today were discussed, and the patient expressed understanding of the given instructions. Patient is instructed to call or message via MyChart if he/she has any questions or concerns regarding our  treatment plan. No barriers to understanding were identified. We discussed Red Flag symptoms and signs in detail. Patient expressed understanding regarding what to do in case of urgent or emergency type symptoms.   Medication list was reconciled, printed and provided to the patient in AVS. Patient instructions and summary information was reviewed with the patient as documented in the AVS. This note was prepared with assistance of Dragon voice recognition software. Occasional wrong-word or sound-a-like substitutions may have occurred due to the inherent limitations of voice recognition software  This visit occurred during the SARS-CoV-2 public health emergency.  Safety protocols were in place, including screening questions prior to the visit, additional usage of staff PPE, and extensive cleaning of exam room while observing appropriate contact time as indicated for disinfecting solutions.

## 2019-10-19 ENCOUNTER — Encounter: Payer: Self-pay | Admitting: Family Medicine

## 2019-10-19 MED FILL — ROSUVASTATIN CALCIUM 5 MG T: 5 | 90 days supply | Qty: 90 | Fill #2

## 2019-10-19 MED FILL — FLUOXETINE HCL 20 MG TABS: 20 | 90 days supply | Qty: 90 | Fill #1

## 2019-10-21 ENCOUNTER — Other Ambulatory Visit: Payer: Self-pay

## 2019-10-21 DIAGNOSIS — I739 Peripheral vascular disease, unspecified: Secondary | ICD-10-CM

## 2019-10-21 MED ORDER — CILOSTAZOL 50 MG PO TABS: 50.0000 mg | ORAL_TABLET | Freq: Two times a day (BID) | ORAL | 3 refills | Status: DC

## 2019-10-21 MED FILL — CILOSTAZOL 50 MG TABLET: 50 | 90 days supply | Qty: 180 | Fill #0

## 2019-10-27 ENCOUNTER — Ambulatory Visit (INDEPENDENT_AMBULATORY_CARE_PROVIDER_SITE_OTHER): Payer: 59 | Admitting: General Practice

## 2019-10-27 ENCOUNTER — Other Ambulatory Visit: Payer: Self-pay

## 2019-10-27 DIAGNOSIS — Z7901 Long term (current) use of anticoagulants: Secondary | ICD-10-CM

## 2019-10-27 LAB — POCT INR: INR: 2.1 (ref 2.0–3.0)

## 2019-10-27 NOTE — Patient Instructions (Addendum)
Pre visit review using our clinic review tool, if applicable. No additional management support is needed unless otherwise documented below in the visit note.  Take 9 mg today and then change dosage and take 6 mg daily except 9 mg on Saturdays. Re-check in 3 weeks.

## 2019-11-02 ENCOUNTER — Encounter: Payer: Self-pay | Admitting: Family Medicine

## 2019-11-03 ENCOUNTER — Other Ambulatory Visit: Payer: Self-pay

## 2019-11-03 DIAGNOSIS — D6859 Other primary thrombophilia: Secondary | ICD-10-CM

## 2019-11-03 MED ORDER — WARFARIN SODIUM 6 MG PO TABS: ORAL_TABLET | ORAL | 1 refills | Status: DC

## 2019-11-03 MED FILL — CARVEDILOL 6.25 MG TABLET: 6.25 | 30 days supply | Qty: 60 | Fill #9

## 2019-11-03 MED FILL — WARFARIN SODIUM 6 MG TABLET: 6 | 90 days supply | Qty: 90 | Fill #0

## 2019-11-09 MED FILL — CYCLOBENZAPRINE HCL 10 MG T: 10 | 90 days supply | Qty: 90 | Fill #1

## 2019-11-10 MED FILL — MEXILETINE HCL 150 MG CAPS: 150 | 30 days supply | Qty: 120 | Fill #5

## 2019-11-17 ENCOUNTER — Other Ambulatory Visit: Payer: Self-pay | Admitting: General Practice

## 2019-11-17 ENCOUNTER — Ambulatory Visit: Payer: 59

## 2019-11-17 DIAGNOSIS — Z7901 Long term (current) use of anticoagulants: Secondary | ICD-10-CM

## 2019-11-21 ENCOUNTER — Other Ambulatory Visit: Payer: Self-pay

## 2019-11-21 ENCOUNTER — Other Ambulatory Visit (INDEPENDENT_AMBULATORY_CARE_PROVIDER_SITE_OTHER): Payer: 59

## 2019-11-21 DIAGNOSIS — Z7901 Long term (current) use of anticoagulants: Secondary | ICD-10-CM

## 2019-11-21 NOTE — Addendum Note (Signed)
Addended by: Doran Clay A on: 11/21/2019 02:48 PM   Modules accepted: Orders

## 2019-11-22 LAB — PROTIME-INR
INR: 2.5 — ABNORMAL HIGH
Prothrombin Time: 25.7 s — ABNORMAL HIGH (ref 9.0–11.5)

## 2019-11-30 MED FILL — CARVEDILOL 6.25 MG TABLET: 6.25 | 30 days supply | Qty: 60 | Fill #10

## 2019-12-08 ENCOUNTER — Other Ambulatory Visit: Payer: Self-pay

## 2019-12-08 ENCOUNTER — Ambulatory Visit (HOSPITAL_COMMUNITY): Payer: 59 | Attending: Cardiovascular Disease

## 2019-12-08 DIAGNOSIS — I493 Ventricular premature depolarization: Secondary | ICD-10-CM

## 2019-12-08 DIAGNOSIS — G8929 Other chronic pain: Secondary | ICD-10-CM | POA: Diagnosis not present

## 2019-12-08 DIAGNOSIS — I4891 Unspecified atrial fibrillation: Secondary | ICD-10-CM | POA: Diagnosis not present

## 2019-12-08 DIAGNOSIS — E785 Hyperlipidemia, unspecified: Secondary | ICD-10-CM | POA: Insufficient documentation

## 2019-12-08 DIAGNOSIS — I739 Peripheral vascular disease, unspecified: Secondary | ICD-10-CM | POA: Diagnosis not present

## 2019-12-08 DIAGNOSIS — I429 Cardiomyopathy, unspecified: Secondary | ICD-10-CM | POA: Diagnosis not present

## 2019-12-08 DIAGNOSIS — F17200 Nicotine dependence, unspecified, uncomplicated: Secondary | ICD-10-CM | POA: Insufficient documentation

## 2019-12-08 DIAGNOSIS — M549 Dorsalgia, unspecified: Secondary | ICD-10-CM | POA: Insufficient documentation

## 2019-12-08 LAB — ECHOCARDIOGRAM COMPLETE
Area-P 1/2: 4.39 cm2
S' Lateral: 3.5 cm

## 2019-12-12 ENCOUNTER — Other Ambulatory Visit (HOSPITAL_COMMUNITY): Payer: 59

## 2019-12-15 MED FILL — MEXILETINE HCL 150 MG CAPS: 150 | 30 days supply | Qty: 120 | Fill #6

## 2019-12-22 ENCOUNTER — Ambulatory Visit (INDEPENDENT_AMBULATORY_CARE_PROVIDER_SITE_OTHER): Payer: 59 | Admitting: Cardiology

## 2019-12-22 ENCOUNTER — Other Ambulatory Visit: Payer: Self-pay

## 2019-12-22 ENCOUNTER — Encounter: Payer: Self-pay | Admitting: Cardiology

## 2019-12-22 VITALS — BP 132/72 | HR 76 | Ht 69.0 in | Wt 176.0 lb

## 2019-12-22 DIAGNOSIS — I493 Ventricular premature depolarization: Secondary | ICD-10-CM | POA: Diagnosis not present

## 2019-12-22 NOTE — Progress Notes (Signed)
Electrophysiology Office Note   Date:  12/22/2019   ID:  [name], DOB 10-03-1965, MRN [id number]  PCP:  Leamon Arnt, MD  Cardiologist:  Debara Pickett Primary Electrophysiologist:  Ilyas Lipsitz Meredith Leeds, MD    Chief Complaint: PVC   History of Present Illness: [name] is a 54 y.o. male who is being seen today for the evaluation of PVC at the request of Leamon Arnt, MD. Presenting today for electrophysiology evaluation.  He has a history of antiphospholipid antibody syndrome on Coumadin, nonischemic cardiomyopathy, nonobstructive coronary artery disease, and 23% PVCs found on cardiac monitor.  He is currently on mexiletine for his PVCs.  Fortunately his ejection fraction has normalized.  Today, denies symptoms of palpitations, chest pain, shortness of breath, orthopnea, PND, lower extremity edema, claudication, dizziness, presyncope, syncope, bleeding, or neurologic sequela. The patient is tolerating medications without difficulties.  Overall he is doing well.  He has no chest pain or shortness of breath.  He has been out of his mexiletine as there was a mixup at the pharmacy.  He does have nausea, though on higher doses.   Past Medical History:  Diagnosis Date   Anticoagulated on Coumadin    Antiphospholipid antibody positive, followed by Dr. Irene Limbo, Hematology 06/27/2016   Anxiety with depression 06/24/2014   Chronic back pain    ED (erectile dysfunction)    GERD (gastroesophageal reflux disease)    Hemorrhoids, internal, with bleeding    Hiatal hernia    History of adenomatous polyp of colon, 07/08/16, tubular adenomas    History of atrial fibrillation, episode 08/2016    History of cardiomyopathy ([place]) 08/04/2018   ECHO 12/02/18  1. The left ventricle has normal systolic function with an ejection fraction of 60-65%. The cavity size was normal. Left ventricular diastolic parameters were normal.  2. The right ventricle has normal systolic function. The cavity was  normal. There is no increase in right ventricular wall thickness.  3. The mitral valve is grossly normal.  4. The aortic valve is tricuspid. Mild thic   History of DVT of lower extremity    Hyperlipidemia    Iron deficiency anemia due to chronic blood loss, thought to be from hemorrhoids while on coumadin, s/p iron infusion and blood transfusion x 2, 2018 2018   Low testosterone in male 05/04/2017   2018, 2019: Decision would be risk vs benefit. Could increase thrombotic risk especially if he has a polycythemic response. If he decided to pursue this with an understanding of the risk - would use lowest dose of testosterone to maintain level low normal 300-400.  2020: Secondary polycythemia. Not a candidate.   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[facility]), sober since 04/2016    S/P insertion of iliac artery stent 2018   02-23-2017  right CIA PTA and stent/05-11-2016 in-stent restenosis s/p thrombectomy/2018 thrombectomy and  patch angioplasty right femoral artery    Past Surgical History:  Procedure Laterality Date   ANGIOPLASTY ILLIAC ARTERY Right 07/17/2016   Procedure: ANGIOPLASTY RIGHT COMMON ILIAC ARTERY;  Surgeon: Conrad Bradshaw, MD;  Location: [facility];  Service: Vascular;  Laterality: Right;   ANTERIOR CERVICAL DECOMP/DISCECTOMY FUSION  07/21/2010   C5 --  C7   CARDIOVASCULAR STRESS TEST  06-14-2016   dr hilty   normal perfusion study w/ no reversible ischemia/  stress ef 44% but visually looks better , echo ordered (LVEF 30-44%)  , normal LV wall motion    COLONOSCOPY N/A 07/08/2016   Procedure: COLONOSCOPY;  Surgeon: Clarene Essex, MD;  Location: [facility];  Service:  Endoscopy;  Laterality: N/A;   ESOPHAGOGASTRODUODENOSCOPY N/A 07/08/2016   Procedure: ESOPHAGOGASTRODUODENOSCOPY (EGD);  Surgeon: Clarene Essex, MD;  Location: [facility];  Service: Endoscopy;  Laterality: N/A;   GROIN DEBRIDEMENT Right 07/25/2016   Procedure: EVACUATION HEMATOMA RIGHT GROIN;  Surgeon: Waynetta Sandy, MD;  Location: [facility];  Service: Vascular;  Laterality: Right;   HEMORRHOID SURGERY N/A 12/07/2016   Procedure: 3 COLUMN HEMORRHOIDECTOMY;  Surgeon: Leighton Ruff, MD;  Location: [facility];  Service: General;  Laterality: N/A;   INTRAOPERATIVE ARTERIOGRAM Right 07/17/2016   Procedure: INTRA OPERATIVE ANGIOGRAM OF RIGHT COMMON ILIAC ARTERY;  Surgeon: Conrad [place], MD;  Location: [facility];  Service: Vascular;  Laterality: Right;   KNEE SURGERY Right 1983   cartilage   PATCH ANGIOPLASTY Right 07/17/2016   Procedure: PATCH ANGIOPLASTY RIGHT FEMORAL ARTERY USING Rueben Bash BIOLOGIC PATCH;  Surgeon: Conrad W[place], MD;  Location: [facility];  Service: Vascular;  Laterality: Right;   PERIPHERAL VASCULAR CATHETERIZATION N/A 02/24/2016   Procedure: Abdominal Aortogram w/Lower Extremity;  Surgeon: Conrad [place], MD;  Location: [facility];  Service: Cardiovascular;  Laterality: N/A;   PERIPHERAL VASCULAR CATHETERIZATION Right 02/24/2016   Procedure: Peripheral Vascular Intervention;  Surgeon: Conrad Welsh, MD;  Location: [facility];  Service: Cardiovascular;  Laterality: Right;  Common iliac   PERIPHERAL VASCULAR CATHETERIZATION N/A 05/11/2016   Procedure: Abdominal Aortogram;  Surgeon: Conrad [place], MD;  Location: [facility];  Service: Cardiovascular;  Laterality: N/A;   PERIPHERAL VASCULAR CATHETERIZATION N/A 05/11/2016   Procedure: Lower Extremity Angiography;  Surgeon: Conrad Thomson, MD;  Location: [facility];  Service: Cardiovascular;  Laterality: N/A;   POSTERIOR CERVICAL FUSION/FORAMINOTOMY N/A 07/05/2018   Procedure: Posterior cervical  fusion with lateral mass fixation - Cervical five-Cervical six, Cervical six-Cervical seven;  Surgeon: Earnie Larsson, MD;  Location: [facility];  Service: Neurosurgery;  Laterality: N/A;   THROMBECTOMY FEMORAL ARTERY Right 07/17/2016   Procedure: THROMBECTOMY RIGHT FEMORAL ARTERY;  Surgeon: Conrad Hooker, MD;  Location: [facility];  Service: Vascular;  Laterality: Right;   TRANSTHORACIC ECHOCARDIOGRAM  06-20-2016   dr hilty   EF 55-60%,  grade 1 diastolic dysfunction, mild TR     Current Outpatient Medications  Medication Sig Dispense Refill   Budeson-Glycopyrrol-Formoterol (BREZTRI AEROSPHERE) 160-9-4.8 MCG/ACT AERO Inhale 2 puffs into the lungs in the morning and at bedtime. 10.7 g 5   carvedilol (COREG) 6.25 MG tablet Take 1 tablet (6.25 mg total) by mouth 2 (two) times daily. 60 tablet 10   cilostazol (PLETAL) 50 MG tablet Take 1 tablet (50 mg total) by mouth 2 (two) times daily. 180 tablet 3   cyclobenzaprine (FLEXERIL) 10 MG tablet TAKE 1 TABLET BY MOUTH AT BEDTIME AS NEEDED FOR MUSCLE SPASMS 90 tablet 3   esomeprazole (NEXIUM) 20 MG capsule Take 20 mg by mouth daily at 12 noon.     FLUoxetine (PROZAC) 20 MG tablet Take 1 tablet (20 mg total) by mouth daily. 90 tablet 3   gabapentin (NEURONTIN) 600 MG tablet Take 1 tablet (600 mg total) by mouth 3 (three) times daily.  270 tablet 3   mexiletine (MEXITIL) 150 MG capsule Take 2 capsules (300 mg total) by mouth 2 (two) times daily. 120 capsule 6   rosuvastatin (CRESTOR) 5 MG tablet Take 1 tablet (5 mg total) by mouth daily. 90 tablet 2   warfarin (COUMADIN) 6 MG tablet Take 1 tablet daily or as directed by anticoagulation clinic 90 tablet 1   No current facility-administered medications for this visit.    Allergies:   Cymbalta [duloxetine hcl]   Social History:  The patient  reports that he has been smoking cigarettes and e-cigarettes. He has a 16.50 pack-year smoking history. He has never used smokeless tobacco. He reports that he does not  drink alcohol and does not use drugs.   Family History:  The patient's family history includes Lung cancer in his father.    ROS:  Please see the history of present illness.   Otherwise, review of systems is positive for none.   All other systems are reviewed and negative.   PHYSICAL EXAM: VS:  BP (!) 132/72    Pulse 76    Ht 5\' 9"  (1.753 m)    Wt 176 lb (79.8 kg)    SpO2 97%    BMI 25.99 kg/m  , BMI Body mass index is 25.99 kg/m. GEN: Well nourished, well developed, in no acute distress  HEENT: normal  Neck: no JVD, carotid bruits, or masses Cardiac: iRRR; no murmurs, rubs, or gallops,no edema  Respiratory:  clear to auscultation bilaterally, normal work of breathing GI: soft, nontender, nondistended, + BS MS: no deformity or atrophy  Skin: warm and dry Neuro:  Strength and sensation are intact Psych: euthymic mood, full affect  EKG:  EKG is ordered today. Personal review of the ekg ordered shows sinus rhythm, PVCs   Recent Labs: 01/30/2019: Magnesium 2.0 10/13/2019: ALT 24; BUN 7; Creatinine, Ser 0.95; Hemoglobin 15.2; Platelets 212.0; Potassium 4.1; Sodium 137; TSH 3.11    Lipid Panel     Component Value Date/Time   CHOL 136 01/30/2019 0855   TRIG 121.0 01/30/2019 0855   HDL 31.70 (L) 01/30/2019 0855   CHOLHDL 4 01/30/2019 0855   VLDL 24.2 01/30/2019 0855   LDLCALC 80 01/30/2019 0855     Wt Readings from Last 3 Encounters:  12/22/19 176 lb (79.8 kg)  10/13/19 172 lb 9.6 oz (78.3 kg)  06/12/19 165 lb 6.4 oz (75 kg)      Other studies Reviewed: Additional studies/ records that were reviewed today include: TTE 12/08/2019  Review of the above records today demonstrates:  1. Left ventricular ejection fraction, by estimation, is 55 to 60%. The  left ventricle has normal function. The left ventricle has no regional  wall motion abnormalities. Left ventricular diastolic parameters were  normal.  2. Right ventricular systolic function is normal. The right ventricular   size is normal. Tricuspid regurgitation signal is inadequate for assessing  PA pressure.  3. The mitral valve is grossly normal. Trivial mitral valve  regurgitation. No evidence of mitral stenosis.  4. The aortic valve is tricuspid. Aortic valve regurgitation is not  visualized. No aortic stenosis is present.  5. The inferior vena cava is normal in size with greater than 50%  respiratory variability, suggesting right atrial pressure of 3 mmHg.    ASSESSMENT AND PLAN:  1.  PVCs: Currently on mexiletine.  He says mexiletine is making him feel quite poorly.  He would like to try and be off of medications.  He would be amenable  to ablation if his ejection fraction goes down again.  Despite that, he is asymptomatic.  We Sagal Gayton plan to hold mexiletine and repeat his echo in 6 months.  I Lycan Davee see him back after that.  If he does become symptomatic he Chelan Heringer call us.  He would need to be bridged off of his Coumadin onto Lovenox for procedures if necessary.  2.  Nonischemic cardiomyopathy: Ejection fraction has normalized to 60 to 65%.  Plan per primary cardiology.    Current medicines are reviewed at length with the patient today.   The patient does not have concerns regarding his medicines.  The following changes were made today: Stop mexiletine  Labs/ tests ordered today include:  Orders Placed This Encounter  Procedures   EKG 12-Lead   ECHOCARDIOGRAM COMPLETE   ECHOCARDIOGRAM COMPLETE     Disposition:   FU with Keywon Mestre 6 months  Signed, Shloma Roggenkamp Meredith Leeds, MD  12/22/2019 11:14 AM     [facility] [address] [place] [address] [phone] (office) [phone] (fax)

## 2019-12-22 NOTE — Patient Instructions (Signed)
Medication Instructions:   ** Stop Mexiletine  *If you need a refill on your cardiac medications before your next appointment, please call your pharmacy*   Lab Work: None ordered.  If you have labs (blood work) drawn today and your tests are completely normal, you will receive your results only by: Marland Kitchen MyChart Message (if you have MyChart) OR . A paper copy in the mail If you have any lab test that is abnormal or we need to change your treatment, we will call you to review the results.   Testing/Procedures: Prior to your 6 month visit Your physician has requested that you have an echocardiogram. Echocardiography is a painless test that uses sound waves to create images of your heart. It provides your doctor with information about the size and shape of your heart and how well your heart's chambers and valves are working. This procedure takes approximately one hour. There are no restrictions for this procedure.    Follow-Up: At [facility], you and your health needs are our priority.  As part of our continuing mission to provide you with exceptional heart care, we have created designated Provider Care Teams.  These Care Teams include your primary Cardiologist (physician) and Advanced Practice Providers (APPs -  Physician Assistants and Nurse Practitioners) who all work together to provide you with the care you need, when you need it.  We recommend signing up for the patient portal called "MyChart".  Sign up information is provided on this After Visit Summary.  MyChart is used to connect with patients for Virtual Visits (Telemedicine).  Patients are able to view lab/test results, encounter notes, upcoming appointments, etc.  Non-urgent messages can be sent to your provider as well.   To learn more about what you can do with MyChart, go to [email or web address].    Your next appointment:   6 month(s)  The format for your next appointment:   In Person  Provider:   Allegra Lai,  MD

## 2019-12-31 ENCOUNTER — Other Ambulatory Visit: Payer: Self-pay | Admitting: Cardiology

## 2019-12-31 MED FILL — CARVEDILOL 6.25 MG TABLET: 6.25 | 30 days supply | Qty: 60 | Fill #0

## 2020-01-13 ENCOUNTER — Ambulatory Visit: Payer: 59

## 2020-01-14 MED FILL — FLUOXETINE HCL 20 MG TABS: 20 | 90 days supply | Qty: 90 | Fill #2

## 2020-01-19 ENCOUNTER — Other Ambulatory Visit: Payer: Self-pay

## 2020-01-19 ENCOUNTER — Ambulatory Visit (INDEPENDENT_AMBULATORY_CARE_PROVIDER_SITE_OTHER): Payer: 59 | Admitting: General Practice

## 2020-01-19 ENCOUNTER — Other Ambulatory Visit: Payer: Self-pay | Admitting: Family Medicine

## 2020-01-19 DIAGNOSIS — E782 Mixed hyperlipidemia: Secondary | ICD-10-CM

## 2020-01-19 DIAGNOSIS — Z7901 Long term (current) use of anticoagulants: Secondary | ICD-10-CM | POA: Diagnosis not present

## 2020-01-19 LAB — POCT INR: INR: 2 (ref 2.0–3.0)

## 2020-01-19 MED FILL — CILOSTAZOL 50 MG TABLET: 50 | 90 days supply | Qty: 180 | Fill #1

## 2020-01-19 MED FILL — BREZTRI AEROSPHERE 160-9-4.: 160-9-4.8 | 30 days supply | Qty: 11 | Fill #1

## 2020-01-19 NOTE — Patient Instructions (Addendum)
Pre visit review using our clinic review tool, if applicable. No additional management support is needed unless otherwise documented below in the visit note.  Take 9 mg today and tomorrow and then change dosage and take 6 mg daily except 7 mg on Mondays and 9 mg on Wed and Fridays.  Re-check 4 weeks.  Please call for appointment the last week of September.

## 2020-01-22 ENCOUNTER — Other Ambulatory Visit: Payer: Self-pay | Admitting: Family Medicine

## 2020-01-22 ENCOUNTER — Other Ambulatory Visit: Payer: Self-pay

## 2020-01-22 DIAGNOSIS — E782 Mixed hyperlipidemia: Secondary | ICD-10-CM

## 2020-01-22 MED ORDER — ROSUVASTATIN CALCIUM 5 MG PO TABS: 5.0000 mg | ORAL_TABLET | Freq: Every day | ORAL | 3 refills | Status: DC

## 2020-01-22 MED FILL — ROSUVASTATIN CALCIUM 5 MG T: 5 | 90 days supply | Qty: 90 | Fill #0

## 2020-01-27 MED FILL — GABAPENTIN 600 MG TABLET: 600 | 90 days supply | Qty: 270 | Fill #2

## 2020-02-01 MED FILL — CARVEDILOL 6.25 MG TABLET: 6.25 | 30 days supply | Qty: 60 | Fill #1

## 2020-02-02 MED FILL — WARFARIN SODIUM 6 MG TABLET: 6 | 90 days supply | Qty: 90 | Fill #1

## 2020-02-05 IMAGING — RF DG CERVICAL SPINE 2 OR 3 VIEWS
1 series · 2 of 2 positions shown · non-contrast
Comparison: CT scan of January 23, 2018.

CLINICAL DATA: Posterior cervical fusion of C5-6 and C6-7.

EXAM:
DG C-ARM 61-120 MIN; CERVICAL SPINE - 2-3 VIEW
FLUOROSCOPY TIME:  11 seconds.

[Series 1: run · 2 of 2 slices shown]
[im 1/2]
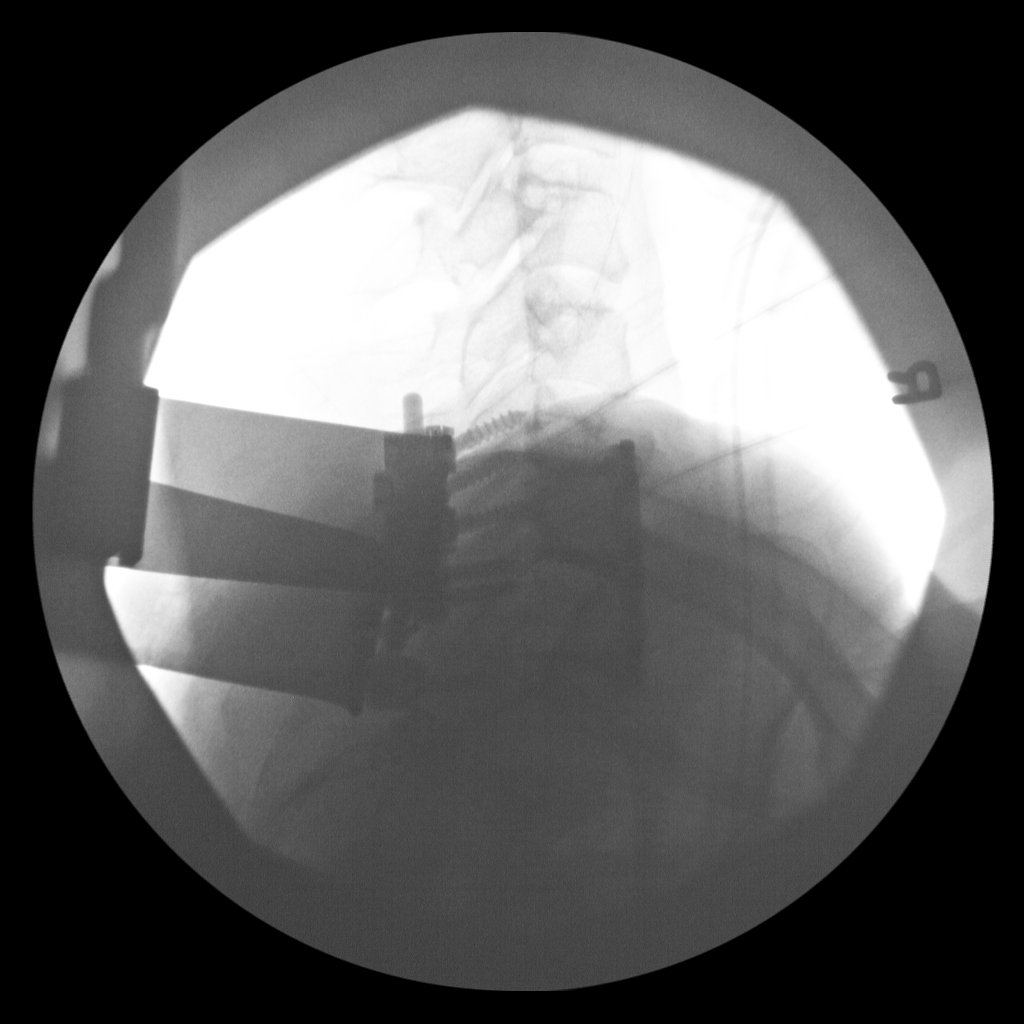
[im 2/2]
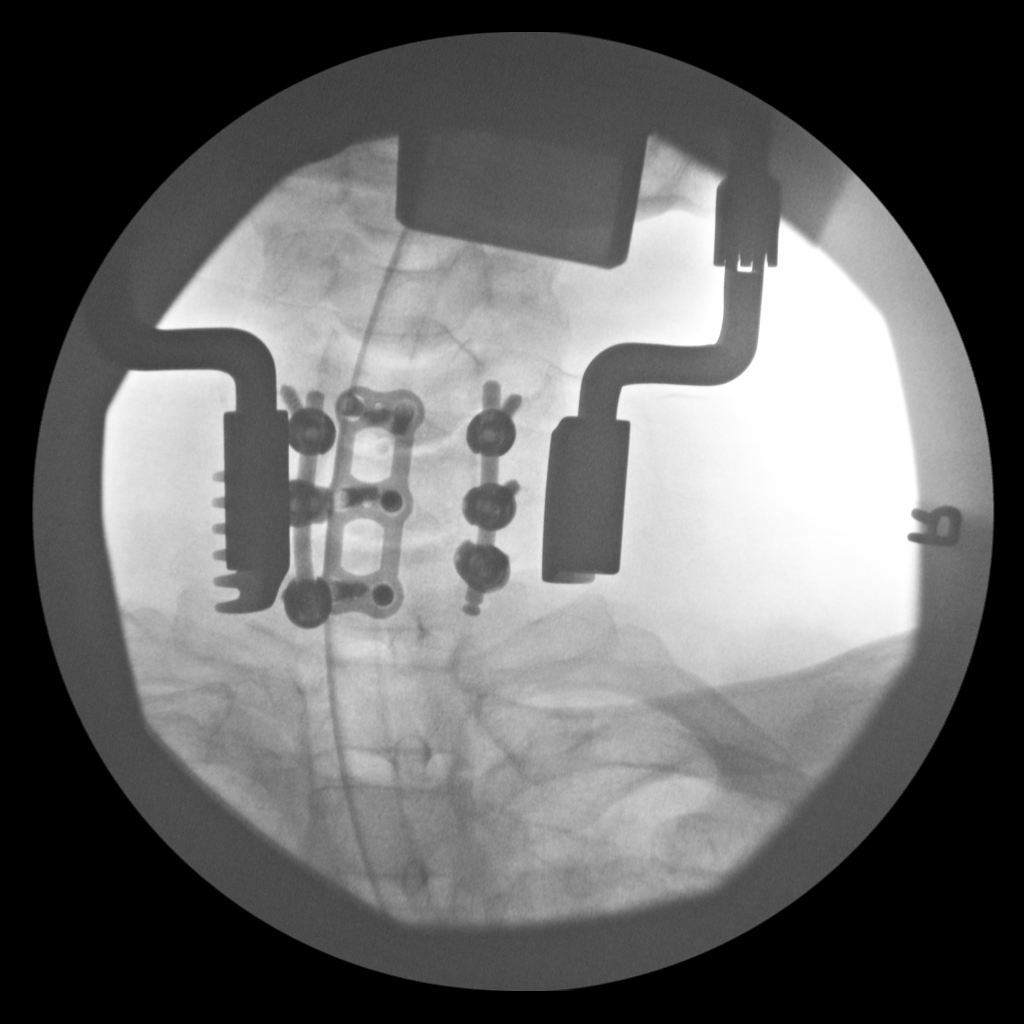

[2 of 2 positions shown; findings below may reference images not displayed]

FINDINGS: Two intraoperative fluoroscopic images of the lower cervical spine
demonstrate the patient be status post surgical anterior and
posterior fusion fusion of C5-6 and C6-7 with bilateral
intrapedicular screw placement.
IMPRESSION: Status post surgical fusion of C5-6 and C6-7.

## 2020-02-10 ENCOUNTER — Ambulatory Visit (INDEPENDENT_AMBULATORY_CARE_PROVIDER_SITE_OTHER)
Admission: RE | Admit: 2020-02-10 | Discharge: 2020-02-10 | Disposition: A | Discharge status: Home / Self Care | Payer: 59 | Source: Ambulatory Visit | Attending: Vascular Surgery | Admitting: Vascular Surgery

## 2020-02-10 ENCOUNTER — Ambulatory Visit (INDEPENDENT_AMBULATORY_CARE_PROVIDER_SITE_OTHER): Payer: 59 | Admitting: Physician Assistant

## 2020-02-10 ENCOUNTER — Other Ambulatory Visit: Payer: Self-pay

## 2020-02-10 ENCOUNTER — Ambulatory Visit (HOSPITAL_COMMUNITY)
Admission: RE | Admit: 2020-02-10 | Discharge: 2020-02-10 | Disposition: A | Discharge status: Home / Self Care | Payer: 59 | Source: Ambulatory Visit | Attending: Vascular Surgery | Admitting: Vascular Surgery

## 2020-02-10 VITALS — BP 112/78 | HR 56 | Temp 98.1°F | Resp 20 | Ht 69.0 in | Wt 176.9 lb

## 2020-02-10 DIAGNOSIS — I739 Peripheral vascular disease, unspecified: Secondary | ICD-10-CM | POA: Insufficient documentation

## 2020-02-10 DIAGNOSIS — I779 Disorder of arteries and arterioles, unspecified: Secondary | ICD-10-CM | POA: Diagnosis not present

## 2020-02-10 DIAGNOSIS — F17200 Nicotine dependence, unspecified, uncomplicated: Secondary | ICD-10-CM | POA: Diagnosis not present

## 2020-02-10 NOTE — Progress Notes (Signed)
HISTORY AND PHYSICAL     CC:  follow up. Requesting Provider:  Leamon Arnt, MD  HPI: This is a 54 y.o. male who is here today for follow up for PAD.  He has hx of right common iliac angioplasty and stenting by Dr. Bridgett Larsson in 2017.  It was for lifestyle limiting short distance claudication.  He later required thrombectomy of the right common iliac artery as well as right common femoral artery patch angioplasty 2018.  Pt was last seen December 2020 by Dr. Carlis Abbott and at that time, his legs were doing well without claudication, rest pain or tissue loss.  He was having some pins and needles sensations in the right foot that had been present for a long time. At that visit, he was still smoking.  Dr. Carlis Abbott was watching some stenosis in the right distal stent and just distal to the stent.  The velocities were relatively stable over the past 6 months and had a palpable pulse in the foot and was asymptomatic.  He was scheduled for 6 month follow up.    The pt returns today for follow up.  He states he has been having some cramping in the right thigh with activity.  He states that it is not consistent.  Sometimes it can happen while walking around the house and sometimes he can walk a much longer distance before it happens.  He states that he does not have any non healing wounds, swelling or color changes.  He does continue to smoke.  He states that he knows he should quit for his health, however, he has anxiety and he does not want to take any benzo's for this and the smoking helps.    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  He was recently seen by cardiology for electrophysiology evaluation.  He was taken off the Mexiletine as it was making him feel bad.   He is to be followed up with cardiology in 6 months.  Cards felt that he should be bridged with Lovenox for procedures.   The pt is on a statin for cholesterol  management.    The pt is not on an aspirin.    Other AC:  Coumadin The pt is on BB for hypertension.  The pt does not have diabetes. Tobacco hx:  current  Pt does not have family hx of AAA.  Past Medical History:  Diagnosis Date  . Anticoagulated on Coumadin   . Antiphospholipid antibody positive, followed by Dr. Irene Limbo, Hematology 06/27/2016  . Anxiety with depression 06/24/2014  . Chronic back pain   . ED (erectile dysfunction)   . GERD (gastroesophageal reflux disease)   . Hemorrhoids, internal, with bleeding   . Hiatal hernia   . History of adenomatous polyp of colon, 07/08/16, tubular adenomas   . History of atrial fibrillation, episode 08/2016   . History of cardiomyopathy ([place]) 08/04/2018   ECHO 12/02/18  1. The left ventricle has normal systolic function with an ejection fraction of 60-65%. The cavity size was normal. Left ventricular diastolic parameters were normal.  2. The right ventricle has normal systolic function. The cavity was normal. There is no increase in right ventricular wall thickness.  3. The mitral valve is grossly normal.  4. The aortic valve is tricuspid. Mild thic  . History of DVT of lower extremity   . Hyperlipidemia   . Iron deficiency anemia due to chronic blood loss, thought to be  from hemorrhoids while on coumadin, s/p iron infusion and blood transfusion x 2, 2018 2018  . Low testosterone in male 05/04/2017   2018, 2019: Decision would be risk vs benefit. Could increase thrombotic risk especially if he has a polycythemic response. If he decided to pursue this with an understanding of the risk - would use lowest dose of testosterone to maintain level low normal 300-400.  2020: Secondary polycythemia. Not a candidate.  . 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 Nicotine dependence   . OA (osteoarthritis)   . PAD (peripheral artery disease) ([place]), followed by Dr Bridgett Larsson    hx right CIA angioplasty and stent 02-24-2016/right CIA thromectomy and patch  angioplasty for restenosis 07-18-2015  . PVCs (premature ventricular contractions)   . 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 S/P insertion of iliac artery stent 2018   02-23-2017  right CIA PTA and stent/05-11-2016 in-stent restenosis s/p thrombectomy/2018 thrombectomy and  patch angioplasty right femoral artery     Past Surgical History:  Procedure Laterality Date  . ANGIOPLASTY ILLIAC ARTERY Right 07/17/2016   Procedure: ANGIOPLASTY RIGHT COMMON ILIAC ARTERY;  Surgeon: Conrad Robstown, MD;  Location: [place];  Service: Vascular;  Laterality: Right;  . ANTERIOR CERVICAL DECOMP/DISCECTOMY FUSION  07/21/2010   C5 -- C7  . CARDIOVASCULAR STRESS TEST  06-14-2016   dr hilty   normal perfusion study w/ no reversible ischemia/  stress ef 44% but visually looks better , echo ordered (LVEF 30-44%)  , normal LV wall motion   . COLONOSCOPY N/A 07/08/2016   Procedure: COLONOSCOPY;  Surgeon: Clarene Essex, MD;  Location: [facility];  Service: Endoscopy;  Laterality: N/A;  . ESOPHAGOGASTRODUODENOSCOPY N/A 07/08/2016   Procedure: ESOPHAGOGASTRODUODENOSCOPY (EGD);  Surgeon: Clarene Essex, MD;  Location: [facility];  Service: Endoscopy;  Laterality: N/A;  . GROIN DEBRIDEMENT Right 07/25/2016   Procedure: EVACUATION HEMATOMA RIGHT GROIN;  Surgeon: Waynetta Sandy, MD;  Location: [place];  Service: Vascular;  Laterality: Right;  . HEMORRHOID SURGERY N/A 12/07/2016   Procedure: 3 COLUMN HEMORRHOIDECTOMY;  Surgeon: Leighton Ruff, MD;  Location: [facility];  Service: General;  Laterality: N/A;  . INTRAOPERATIVE ARTERIOGRAM Right 07/17/2016   Procedure: INTRA OPERATIVE ANGIOGRAM OF RIGHT COMMON ILIAC ARTERY;  Surgeon: Conrad Chambersburg, MD;  Location: [place];  Service: Vascular;  Laterality: Right;  . KNEE SURGERY Right 1983   cartilage  . PATCH ANGIOPLASTY Right 07/17/2016   Procedure: PATCH ANGIOPLASTY RIGHT FEMORAL ARTERY USING Rueben Bash BIOLOGIC PATCH;  Surgeon: Conrad [place]ham[place],  MD;  Location: [place];  Service: Vascular;  Laterality: Right;  . PERIPHERAL VASCULAR CATHETERIZATION N/A 02/24/2016   Procedure: Abdominal Aortogram w/Lower Extremity;  Surgeon: Conrad Banner Elk, MD;  Location: [place] [facility];  Service: Cardiovascular;  Laterality: N/A;  . PERIPHERAL VASCULAR CATHETERIZATION Right 02/24/2016   Procedure: Peripheral Vascular Intervention;  Surgeon: Conrad Tildenville, MD;  Location: [place] [facility];  Service: Cardiovascular;  Laterality: Right;  Common iliac  . PERIPHERAL VASCULAR CATHETERIZATION N/A 05/11/2016   Procedure: Abdominal Aortogram;  Surgeon: Conrad [place], MD;  Location: [place] [facility];  Service: Cardiovascular;  Laterality: N/A;  . PERIPHERAL VASCULAR CATHETERIZATION N/A 05/11/2016   Procedure: Lower Extremity Angiography;  Surgeon: Conrad Beacon, MD;  Location: [place] [facility];  Service: Cardiovascular;  Laterality: N/A;  . POSTERIOR CERVICAL FUSION/FORAMINOTOMY N/A 07/05/2018   Procedure: Posterior cervical fusion with lateral mass fixation - Cervical five-Cervical six, Cervical  six-Cervical seven;  Surgeon: Earnie Larsson, MD;  Location: [place];  Service: Neurosurgery;  Laterality: N/A;  . THROMBECTOMY FEMORAL ARTERY Right 07/17/2016   Procedure: THROMBECTOMY RIGHT FEMORAL ARTERY;  Surgeon: Conrad [place], MD;  Location: [place];  Service: Vascular;  Laterality: Right;  . TRANSTHORACIC ECHOCARDIOGRAM  06-20-2016   dr hilty   EF 55-60%,  grade 1 diastolic dysfunction, mild TR    Allergies  Allergen Reactions  . Cymbalta [Duloxetine Hcl] Other (See Comments)    Headaches    Current Outpatient Medications  Medication Sig Dispense Refill  . Budeson-Glycopyrrol-Formoterol (BREZTRI AEROSPHERE) 160-9-4.8 MCG/ACT AERO Inhale 2 puffs into the lungs in the morning and at bedtime. 10.7 g 5  . carvedilol (COREG) 6.25 MG tablet TAKE 1 TABLET BY MOUTH TWICE DAILY 60 tablet 11  . cilostazol (PLETAL) 50 MG tablet Take 1 tablet (50 mg total) by mouth 2 (two) times  daily. 180 tablet 3  . cyclobenzaprine (FLEXERIL) 10 MG tablet TAKE 1 TABLET BY MOUTH AT BEDTIME AS NEEDED FOR MUSCLE SPASMS 90 tablet 3  . esomeprazole (NEXIUM) 20 MG capsule Take 20 mg by mouth daily at 12 noon.    Marland Kitchen FLUoxetine (PROZAC) 20 MG tablet Take 1 tablet (20 mg total) by mouth daily. 90 tablet 3  . gabapentin (NEURONTIN) 600 MG tablet Take 1 tablet (600 mg total) by mouth 3 (three) times daily. 270 tablet 3  . rosuvastatin (CRESTOR) 5 MG tablet Take 1 tablet (5 mg total) by mouth daily. 90 tablet 3  . warfarin (COUMADIN) 6 MG tablet Take 1 tablet daily or as directed by anticoagulation clinic 90 tablet 1   No current facility-administered medications for this visit.    Family History  Problem Relation Age of Onset  . Lung cancer Father     Social History   Socioeconomic History  . Marital status: Married    Spouse name: Almyra Free  . Number of children: Not on file  . Years of education: Not on file  . Highest education level: Not on file  Occupational History  . Occupation: Disabled  Tobacco Use  . Smoking status: Current Every Day Smoker    Packs/day: 0.50    Years: 33.00    Pack years: 16.50    Types: Cigarettes, E-cigarettes  . Smokeless tobacco: Never Used  Vaping Use  . Vaping Use: Never used  Substance and Sexual Activity  . Alcohol use: No    Comment: Sober since 2017  . Drug use: No  . Sexual activity: Yes    Partners: Female  Other Topics Concern  . Not on file  Social History Narrative  . Not on file   Social Determinants of Health   Financial Resource Strain:   . Difficulty of Paying Living Expenses: Not on file  Food Insecurity:   . Worried About Charity fundraiser in the Last Year: Not on file  . Ran Out of Food in the Last Year: Not on file  Transportation Needs:   . Lack of Transportation (Medical): Not on file  . Lack of Transportation (Non-Medical): Not on file  Physical Activity:   . Days of Exercise per Week: Not on file  . Minutes  of Exercise per Session: Not on file  Stress:   . Feeling of Stress : Not on file  Social Connections:   . Frequency of Communication with Friends and Family: Not on file  . Frequency of Social Gatherings with Friends and Family: Not on file  .  Attends Religious Services: Not on file  . Active Member of Clubs or Organizations: Not on file  . Attends Archivist Meetings: Not on file  . Marital Status: Not on file  Intimate Partner Violence:   . Fear of Current or Ex-Partner: Not on file  . Emotionally Abused: Not on file  . Physically Abused: Not on file  . Sexually Abused: Not on file     REVIEW OF SYSTEMS:   [X]  denotes positive finding, [ ]  denotes negative finding Cardiac  Comments:  Chest pain or chest pressure:    Shortness of breath upon exertion:    Short of breath when lying flat:    Irregular heart rhythm:        Vascular    Pain in calf, thigh, or hip brought on by ambulation: x   Pain in feet at night that wakes you up from your sleep:     Blood clot in your veins:    Leg swelling:         Pulmonary    Oxygen at home:    Productive cough:     Wheezing:         Neurologic    Sudden weakness in arms or legs:     Sudden numbness in arms or legs:     Sudden onset of difficulty speaking or slurred speech:    Temporary loss of vision in one eye:     Problems with dizziness:         Gastrointestinal    Blood in stool:     Vomited blood:         Genitourinary    Burning when urinating:     Blood in urine:        Psychiatric    Major depression:         Hematologic    Bleeding problems:    Problems with blood clotting too easily:        Skin    Rashes or ulcers:        Constitutional    Fever or chills:      PHYSICAL EXAMINATION:  Today's Vitals   02/10/20 0904  BP: 112/78  Pulse: (!) 56  Resp: 20  Temp: 98.1 F (36.7 C)  TempSrc: Temporal  SpO2: 100%  Weight: 176 lb 14.4 oz (80.2 kg)  Height: 5\' 9"  (1.753 m)   Body mass  index is 26.12 kg/m.   General:  WDWN in NAD; vital signs documented above Gait: Not observed HENT: WNL, normocephalic Pulmonary: normal non-labored breathing , without wheezing Cardiac: regular HR, without  Murmur; without carotid bruits Abdomen: soft, NT, no masses; aortic pulse is not palpable Skin: without rashes Vascular Exam/Pulses:  Right Left  Radial 2+ (normal) 2+ (normal)  Ulnar Unable to palpate Unable to palpate  Femoral 2+ (normal) 2+ (normal)  Popliteal Unable to palpate Unable to palpate  DP Unable to palpate 2+ (normal)  PT 1+ (weak) Unable to palpate   Extremities: without ischemic changes, without Gangrene , without cellulitis; without open wounds;  Musculoskeletal: no muscle wasting or atrophy  Neurologic: A&O X 3;  No focal weakness or paresthesias are detected Psychiatric:  The pt has Normal affect.   Non-Invasive Vascular Imaging:   ABI's/TBI's on 02/10/2020: Right:  1.17/0.91 (T) - Great toe pressure: 105 Left:  1.22/1.40 (T) - Great toe pressure: 161  Arterial duplex on 02/10/2020: Right Stent(s):  +---------------+---++---------++  Prox to Stent 91 biphasic   +---------------+---++---------++  Proximal  Stent 157biphasic   +---------------+---++---------++  Mid Stent   141biphasic   +---------------+---++---------++  Distal Stent  283triphasic  +---------------+---++---------++  Distal to Stent273biphasic   +---------------+---++---------++   Summary:  Stenosis:  Patent right CIA stent with >50% stnenosis in the distal stent and  outflow.  Previous ABI's/TBI's on 05/20/2019: Right:  1.12/0.57  Left:  1.2/0.89  Previous arterial duplex on 05/20/2019: Right Stent(s):  +---------------+---+---------------+---------++  Prox to Stent 127        triphasic  +---------------+---+---------------+---------++  Proximal Stent 188        triphasic    +---------------+---+---------------+---------++  Mid Stent   154        triphasic  +---------------+---+---------------+---------++  Distal Stent  25650-99% stenosistriphasic  +---------------+---+---------------+---------++  Distal to Stent31850-99% stenosisbiphasic   +---------------+---+---------------+---------++   Distal aorta 111 cm/s     Summary:  IVC/Iliac: Patent right common iliac artery stent with increased velocity  in the distal stent and outflow suggestive of 50 - 99% stenosis range.     ASSESSMENT/PLAN:: 54 y.o. male here for follow up for  hx of right common iliac angioplasty and stenting by Dr. Bridgett Larsson in 2017.  It was for lifestyle limiting short distance claudication.  He later required thrombectomy of the right common iliac artery as well as right common femoral artery patch angioplasty 2018.  PAD -overall pt is doing well.  He does have some pain/cramping in the right buttock/thigh when walking, but it is not consistent when this happens.  He states he also has a hx of DDD.  He does not have any non healing wounds, rest pain or tissue loss.   -his studies today are stable from his previous visit.  -pt will f/u in 6 months with ABI and aorto-iliac duplex.  He and his wife know that if he has any new wounds or rest pain, he will f/u sooner. We discussed if this should happen, he may need arteriogram in the future or other procedures.  They expressed understanding   SMOKING CESSATION -pt continues to smoke and I discussed with him the importance of smoking cessation.  He knows he needs to quit for his health, but he states smoking helps with his anxiety and he does not want to take benzodiazapine medication.  I asked him to speak with his PCP as there are some anti-depressants with smoking cessation side effects.   ANTICOAGULATION -pt with hx of antiphospholipid antibody syndrome on Coumadin.  Should he need a procedure in the future, it is  recommended he be bridged with Lovenox.    Leontine Locket, [facility] [phone]  Clinic MD:   Scot Dock on call MD

## 2020-02-11 ENCOUNTER — Other Ambulatory Visit: Payer: Self-pay | Admitting: *Deleted

## 2020-02-11 DIAGNOSIS — I700 Atherosclerosis of aorta: Secondary | ICD-10-CM

## 2020-02-11 DIAGNOSIS — I779 Disorder of arteries and arterioles, unspecified: Secondary | ICD-10-CM

## 2020-02-11 DIAGNOSIS — I745 Embolism and thrombosis of iliac artery: Secondary | ICD-10-CM

## 2020-02-16 ENCOUNTER — Ambulatory Visit: Payer: 59

## 2020-02-23 ENCOUNTER — Other Ambulatory Visit: Payer: 59

## 2020-02-23 ENCOUNTER — Other Ambulatory Visit: Payer: Self-pay | Admitting: General Practice

## 2020-02-23 DIAGNOSIS — Z7901 Long term (current) use of anticoagulants: Secondary | ICD-10-CM

## 2020-02-27 ENCOUNTER — Encounter: Payer: Self-pay | Admitting: Family Medicine

## 2020-02-27 ENCOUNTER — Ambulatory Visit (INDEPENDENT_AMBULATORY_CARE_PROVIDER_SITE_OTHER): Payer: 59 | Admitting: Family Medicine

## 2020-02-27 ENCOUNTER — Other Ambulatory Visit: Payer: Self-pay | Admitting: Family Medicine

## 2020-02-27 ENCOUNTER — Other Ambulatory Visit: Payer: Self-pay

## 2020-02-27 VITALS — BP 128/92 | HR 86 | Ht 69.0 in | Wt 180.0 lb

## 2020-02-27 DIAGNOSIS — M5416 Radiculopathy, lumbar region: Secondary | ICD-10-CM | POA: Diagnosis not present

## 2020-02-27 DIAGNOSIS — I779 Disorder of arteries and arterioles, unspecified: Secondary | ICD-10-CM

## 2020-02-27 DIAGNOSIS — Z7901 Long term (current) use of anticoagulants: Secondary | ICD-10-CM | POA: Diagnosis not present

## 2020-02-27 DIAGNOSIS — G8929 Other chronic pain: Secondary | ICD-10-CM | POA: Diagnosis not present

## 2020-02-27 DIAGNOSIS — M5441 Lumbago with sciatica, right side: Secondary | ICD-10-CM

## 2020-02-27 LAB — PROTIME-INR
INR: 2.3 ratio — ABNORMAL HIGH (ref 0.8–1.0)
Prothrombin Time: 25.8 s — ABNORMAL HIGH (ref 9.6–13.1)

## 2020-02-27 NOTE — Assessment & Plan Note (Signed)
Patient's most recent MRI does show that there has been some mild progression at the L4-L5 as well as at the L5-S1.  I do believe at this point we will try another epidural and because patient's pain is more centered around the L5 nerve root today we will go down to L5-S1 epidural and see if we make any significant improvement.  Patient given a trial of a long-acting gabapentin and we can make the change otherwise.  Patient will discontinue the other anti-inflammatories when possible.  We discussed we could do more prednisone if necessary but once again with patient being on the blood thinner and we want to avoid this regularly.  Increase activity slowly.  Follow-up again in 3 to 4 weeks after the epidural

## 2020-02-27 NOTE — Patient Instructions (Signed)
[facility] [phone] [place] at night-stop gabapentin

## 2020-02-27 NOTE — Progress Notes (Signed)
[place] Phone: [phone] Subjective:   [name], am serving as a scribe for Dr. Hulan Saas. This visit occurred during the SARS-CoV-2 public health emergency.  Safety protocols were in place, including screening questions prior to the visit, additional usage of staff PPE, and extensive cleaning of exam room while observing appropriate contact time as indicated for disinfecting solutions.   I'm seeing this patient by the request  of:  William Arnt, MD  CC: low back pain   HYQ:[id number]  [name] is a 54 y.o. male coming in with complaint of lower back pain. Patient has intermittent pain that causes a significant amount of pain on left side. Standing increases his pain.  Patient states that it feels somewhat similar but now started getting worsening pain again.  Seems to be a little bit lower than last time patient had pain.  Meds include Gabapentin 600mg  3 times a day, flexeril 10mg , coumadin   Last epidural that we had ordered was in December 10, 2018   Most recent MRI that I have is from September 2020.  Patient did have a degenerative disc disease at L4-L5 with a broad-based disc bulge and what appeared to be a left-sided annular tear at the L3-L4 both seem to have Christensen impingement on the L4 nerve roots.    Past Medical History:  Diagnosis Date  . Anticoagulated on Coumadin   . Antiphospholipid antibody positive, followed by Dr. Irene Christensen, Hematology 06/27/2016  . Anxiety with depression 06/24/2014  . Chronic back pain   . ED (erectile dysfunction)   . GERD (gastroesophageal reflux disease)   . Hemorrhoids, internal, with bleeding   . Hiatal hernia   . History of adenomatous polyp of colon, 07/08/16, tubular adenomas   . History of atrial fibrillation, episode 08/2016   . History of cardiomyopathy ([place]) 08/04/2018   ECHO 12/02/18  1. The left ventricle has normal systolic function with an ejection  fraction of 60-65%. The cavity size was normal. Left ventricular diastolic parameters were normal.  2. The right ventricle has normal systolic function. The cavity was normal. There is Christensen increase in right ventricular wall thickness.  3. The mitral valve is grossly normal.  4. The aortic valve is tricuspid. Mild thic  . History of DVT of lower extremity   . Hyperlipidemia   . Iron deficiency anemia due to chronic blood loss, thought to be from hemorrhoids while on coumadin, s/p iron infusion and blood transfusion x 2, 2018 2018  . Low testosterone in male 05/04/2017   2018, 2019: Decision would be risk vs benefit. Could increase thrombotic risk especially if he has a polycythemic response. If he decided to pursue this with an understanding of the risk - would use lowest dose of testosterone to maintain level low normal 300-400.  2020: Secondary polycythemia. Not a candidate.  . 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 Nicotine dependence   . OA (osteoarthritis)   . PAD (peripheral artery disease) ([place]), followed by Dr [name]    hx right CIA angioplasty and stent 02-24-2016/right CIA thromectomy and patch angioplasty for restenosis 07-18-2015  . PVCs (premature ventricular contractions)   . 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 S/P insertion of iliac artery stent 2018   02-23-2017  right CIA PTA and stent/05-11-2016 in-stent restenosis s/p thrombectomy/2018 thrombectomy and  patch angioplasty right femoral artery    Past Surgical History:  Procedure Laterality Date  . ANGIOPLASTY ILLIAC ARTERY Right 07/17/2016   Procedure: ANGIOPLASTY RIGHT COMMON ILIAC ARTERY;  Surgeon: Conrad Summer Shade, MD;  Location: [place];  Service: Vascular;  Laterality: Right;  . ANTERIOR CERVICAL DECOMP/DISCECTOMY FUSION  07/21/2010   C5 -- C7  . CARDIOVASCULAR STRESS TEST  06-14-2016   dr hilty   normal perfusion study w/ Christensen reversible ischemia/  stress ef 44% but visually looks better ,  echo ordered (LVEF 30-44%)  , normal LV wall motion   . COLONOSCOPY N/A 07/08/2016   Procedure: COLONOSCOPY;  Surgeon: Clarene Essex, MD;  Location: [facility];  Service: Endoscopy;  Laterality: N/A;  . ESOPHAGOGASTRODUODENOSCOPY N/A 07/08/2016   Procedure: ESOPHAGOGASTRODUODENOSCOPY (EGD);  Surgeon: Clarene Essex, MD;  Location: [facility];  Service: Endoscopy;  Laterality: N/A;  . GROIN DEBRIDEMENT Right 07/25/2016   Procedure: EVACUATION HEMATOMA RIGHT GROIN;  Surgeon: Waynetta Sandy, MD;  Location: [place];  Service: Vascular;  Laterality: Right;  . HEMORRHOID SURGERY N/A 12/07/2016   Procedure: 3 COLUMN HEMORRHOIDECTOMY;  Surgeon: Leighton Ruff, MD;  Location: [facility];  Service: General;  Laterality: N/A;  . INTRAOPERATIVE ARTERIOGRAM Right 07/17/2016   Procedure: INTRA OPERATIVE ANGIOGRAM OF RIGHT COMMON ILIAC ARTERY;  Surgeon: Conrad Porcupine, MD;  Location: [place];  Service: Vascular;  Laterality: Right;  . KNEE SURGERY Right 1983   cartilage  . PATCH ANGIOPLASTY Right 07/17/2016   Procedure: PATCH ANGIOPLASTY RIGHT FEMORAL ARTERY USING Rueben Bash BIOLOGIC PATCH;  Surgeon: Conrad Hopkins Park, MD;  Location: [place];  Service: Vascular;  Laterality: Right;  . PERIPHERAL VASCULAR CATHETERIZATION N/A 02/24/2016   Procedure: Abdominal Aortogram w/Lower Extremity;  Surgeon: Conrad Juntura, MD;  Location: [place] [facility];  Service: Cardiovascular;  Laterality: N/A;  . PERIPHERAL VASCULAR CATHETERIZATION Right 02/24/2016   Procedure: Peripheral Vascular Intervention;  Surgeon: Conrad Saylorsburg, MD;  Location: [place] [facility];  Service: Cardiovascular;  Laterality: Right;  Common iliac  . PERIPHERAL VASCULAR CATHETERIZATION N/A 05/11/2016   Procedure: Abdominal Aortogram;  Surgeon: Conrad Monticello, MD;  Location: [place] [facility];  Service: Cardiovascular;  Laterality: N/A;  . PERIPHERAL VASCULAR CATHETERIZATION N/A 05/11/2016   Procedure: Lower Extremity Angiography;  Surgeon: Conrad Manchester,  MD;  Location: [place] [facility];  Service: Cardiovascular;  Laterality: N/A;  . POSTERIOR CERVICAL FUSION/FORAMINOTOMY N/A 07/05/2018   Procedure: Posterior cervical fusion with lateral mass fixation - Cervical five-Cervical six, Cervical six-Cervical seven;  Surgeon: Earnie Larsson, MD;  Location: [place];  Service: Neurosurgery;  Laterality: N/A;  . THROMBECTOMY FEMORAL ARTERY Right 07/17/2016   Procedure: THROMBECTOMY RIGHT FEMORAL ARTERY;  Surgeon: Conrad Markleville, MD;  Location: [place];  Service: Vascular;  Laterality: Right;  . TRANSTHORACIC ECHOCARDIOGRAM  06-20-2016   dr hilty   EF 55-60%,  grade 1 diastolic dysfunction, mild TR   Social History   Socioeconomic History  . Marital status: Married    Spouse name: Almyra Free  . Number of children: Not on file  . Years of education: Not on file  . Highest education level: Not on file  Occupational History  . Occupation: Disabled  Tobacco Use  . Smoking status: Current Every Day Smoker    Packs/day: 0.50    Years: 33.00    Pack years: 16.50    Types: Cigarettes, E-cigarettes  . Smokeless tobacco: Never Used  Vaping Use  . Vaping Use: Never used  Substance and Sexual Activity  . Alcohol use: Christensen  Comment: Sober since 2017  . Drug use: Christensen  . Sexual activity: Yes    Partners: Female  Other Topics Concern  . Not on file  Social History Narrative  . Not on file   Social Determinants of Health   Financial Resource Strain:   . Difficulty of Paying Living Expenses: Not on file  Food Insecurity:   . Worried About Charity fundraiser in the Last Year: Not on file  . Ran Out of Food in the Last Year: Not on file  Transportation Needs:   . Lack of Transportation (Medical): Not on file  . Lack of Transportation (Non-Medical): Not on file  Physical Activity:   . Days of Exercise per Week: Not on file  . Minutes of Exercise per Session: Not on file  Stress:   . Feeling of Stress : Not on file  Social Connections:   . Frequency of  Communication with Friends and Family: Not on file  . Frequency of Social Gatherings with Friends and Family: Not on file  . Attends Religious Services: Not on file  . Active Member of Clubs or Organizations: Not on file  . Attends Archivist Meetings: Not on file  . Marital Status: Not on file   Allergies  Allergen Reactions  . Cymbalta [Duloxetine Hcl] Other (See Comments)    Headaches   Family History  Problem Relation Age of Onset  . Lung cancer Father      Current Outpatient Medications (Cardiovascular):  .  carvedilol (COREG) 6.25 MG tablet, TAKE 1 TABLET BY MOUTH TWICE DAILY .  rosuvastatin (CRESTOR) 5 MG tablet, Take 1 tablet (5 mg total) by mouth daily.  Current Outpatient Medications (Respiratory):  Marland Kitchen  Budeson-Glycopyrrol-Formoterol (BREZTRI AEROSPHERE) 160-9-4.8 MCG/ACT AERO, Inhale 2 puffs into the lungs in the morning and at bedtime.   Current Outpatient Medications (Hematological):  .  cilostazol (PLETAL) 50 MG tablet, Take 1 tablet (50 mg total) by mouth 2 (two) times daily. Marland Kitchen  warfarin (COUMADIN) 6 MG tablet, Take 1 tablet daily or as directed by anticoagulation clinic  Current Outpatient Medications (Other):  .  cyclobenzaprine (FLEXERIL) 10 MG tablet, TAKE 1 TABLET BY MOUTH AT BEDTIME AS NEEDED FOR MUSCLE SPASMS .  esomeprazole (NEXIUM) 20 MG capsule, Take 20 mg by mouth daily at 12 noon. Marland Kitchen  FLUoxetine (PROZAC) 20 MG tablet, Take 1 tablet (20 mg total) by mouth daily. Marland Kitchen  gabapentin (NEURONTIN) 600 MG tablet, Take 1 tablet (600 mg total) by mouth 3 (three) times daily.   Reviewed prior external information including notes and imaging from  primary care provider As well as notes that were available from care everywhere and other healthcare systems.  Past medical history, social, surgical and family history all reviewed in electronic medical record.  Christensen pertanent information unless stated regarding to the chief complaint.   Review of Systems:  Christensen  headache, visual changes, nausea, vomiting, diarrhea, constipation, dizziness, abdominal pain, skin rash, fevers, chills, night sweats, weight loss, swollen lymph nodes, body aches, joint swelling, chest pain, shortness of breath, mood changes. POSITIVE muscle aches  Objective  Blood pressure (!) 128/92, pulse 86, height 5\' 9"  (1.753 m), weight 180 lb (81.6 kg), SpO2 97 %.   General: Christensen apparent distress alert and oriented x3 mood and affect normal, dressed appropriately.  HEENT: Pupils equal, extraocular movements intact  Respiratory: Patient's speak in full sentences and does not appear short of breath  Cardiovascular: Christensen lower extremity edema, non tender, Christensen erythema  Neuro: Cranial nerves II through XII are intact, neurovascularly intact in all extremities with 2+ DTRs and 2+ pulses.  Gait normal with good balance and coordination.  MSK: Mild arthritic changes of multiple joints Back exam does have significant loss of lordosis.  Patient does have tenderness to palpation in the paraspinal musculature lumbar spine right greater than left.  Patient does have tightness noted with Corky Sox.  Tightness with straight leg test but Christensen radicular symptoms.  Worsening pain with extension of the back.    Impression and Recommendations:     The above documentation has been reviewed and is accurate and complete Lyndal Pulley, DO

## 2020-02-29 MED FILL — CARVEDILOL 6.25 MG TABLET: 6.25 | 30 days supply | Qty: 60 | Fill #2

## 2020-03-04 ENCOUNTER — Other Ambulatory Visit: Payer: Self-pay | Admitting: Family Medicine

## 2020-03-05 ENCOUNTER — Encounter: Payer: Self-pay | Admitting: Family Medicine

## 2020-03-08 ENCOUNTER — Other Ambulatory Visit: Payer: Self-pay | Admitting: Family Medicine

## 2020-03-10 ENCOUNTER — Other Ambulatory Visit: Payer: Self-pay | Admitting: General Practice

## 2020-03-10 ENCOUNTER — Other Ambulatory Visit: Payer: Self-pay | Admitting: Family Medicine

## 2020-03-10 ENCOUNTER — Telehealth: Payer: Self-pay | Admitting: General Practice

## 2020-03-10 DIAGNOSIS — Z7901 Long term (current) use of anticoagulants: Secondary | ICD-10-CM

## 2020-03-10 MED ORDER — ENOXAPARIN SODIUM 120 MG/0.8ML ~~LOC~~ SOLN: 120.0000 mg | SUBCUTANEOUS | 0 refills | Status: DC

## 2020-03-10 MED FILL — ENOXAPARIN 120 MG/0.8 ML SY: 120 | 7 days supply | Qty: 6 | Fill #0

## 2020-03-10 NOTE — Telephone Encounter (Signed)
-----   Message from Leamon Arnt, MD sent at 03/10/2020  1:40 PM EDT ----- Regarding: RE: Lovenox bridge Thanks! ----- Message ----- From: Warden Fillers, RN Sent: 03/10/2020  10:58 AM EDT To: Leamon Arnt, MD Subject: RE: Lovenox bridge                             Just need an OK.  I will dose it and send it to the pharmacy:) ----- Message ----- From: Leamon Arnt, MD Sent: 03/10/2020  10:20 AM EDT To: Warden Fillers, RN Subject: RE: Lovenox bridge                             So you just need verbal order/ok? Or you need me to send in Rxs?  Dr .Jonni Sanger  ----- Message ----- From: Warden Fillers, RN Sent: 03/10/2020   8:34 AM EDT To: Leamon Arnt, MD Subject: Lovenox bridge                                 Hi Dr. Jonni Sanger!  Patient is having an epidural on 11/5.  He will need to stop warfarin for 5 days and will need a Lovenox bridge.  I just need your permission to dose and send in Rx for the Lovenox.  Please advise.  Thanks, Villa Herb, RN

## 2020-03-10 NOTE — Telephone Encounter (Signed)
Instructions for coumadin and Lovenox pre and post procedure on 11/5.  10/31 - Last dose of coumadin until after procedure 11/1 - Do not take coumadin or Lovenox today 11/2 - Take Lovenox X 1 in the AM 11/3 - Take Lovenox X 1 in the AM 11/4 - Take Lovenox X 1 in the AM 11/5 - Procedure (Do not take Lovenox today) 11/6 - Take Lovenox X 1 in the AM AND take 2 tablets of coumadin 11/7 - Take Lovenox in the AM AND 2 tablets of coumadin 11/8 - Take Lovenox in the AM and 2 tablets of coumadin 11/9 - Take Lovenox in the AM and 1 1/2 tablets of coumadin 11/10 - Stop Lovenox and Resume current dose of coumadin (Check INR)

## 2020-03-24 ENCOUNTER — Other Ambulatory Visit: Payer: Self-pay

## 2020-03-24 ENCOUNTER — Telehealth: Payer: Self-pay | Admitting: Family Medicine

## 2020-03-24 ENCOUNTER — Other Ambulatory Visit: Payer: Self-pay | Admitting: Family Medicine

## 2020-03-24 MED ORDER — HORIZANT 600 MG PO TBCR: 600.0000 mg | EXTENDED_RELEASE_TABLET | Freq: Every day | ORAL | 1 refills | Status: DC

## 2020-03-24 NOTE — Telephone Encounter (Signed)
Per a verbal from Dr. Tamala Julian, called in [place] 600mg , QHS for patient. Patient notified.

## 2020-03-24 NOTE — Telephone Encounter (Signed)
Patient would like a prescription sent in to the speciality pharmacy for [place]. He has done well with this and would like to continue (depending on cost from the pharmacy). They asked if Julie's number could be added to the note so that they will contact her for payment and mailing info [phone]).

## 2020-03-25 ENCOUNTER — Ambulatory Visit: Payer: 59

## 2020-03-25 MED FILL — BREZTRI AEROSPHERE 160-9-4.: 160-9-4.8 | 30 days supply | Qty: 11 | Fill #2

## 2020-03-26 ENCOUNTER — Inpatient Hospital Stay: Admission: RE | Admit: 2020-03-26 | Payer: 59 | Source: Ambulatory Visit

## 2020-03-28 ENCOUNTER — Encounter: Payer: Self-pay | Admitting: Family Medicine

## 2020-03-28 MED FILL — CARVEDILOL 6.25 MG TABLET: 6.25 | 30 days supply | Qty: 60 | Fill #3

## 2020-03-29 ENCOUNTER — Ambulatory Visit (INDEPENDENT_AMBULATORY_CARE_PROVIDER_SITE_OTHER): Payer: 59 | Admitting: General Practice

## 2020-03-29 ENCOUNTER — Other Ambulatory Visit: Payer: Self-pay

## 2020-03-29 ENCOUNTER — Other Ambulatory Visit: Payer: Self-pay | Admitting: Family Medicine

## 2020-03-29 ENCOUNTER — Other Ambulatory Visit: Payer: Self-pay | Admitting: General Practice

## 2020-03-29 DIAGNOSIS — Z7901 Long term (current) use of anticoagulants: Secondary | ICD-10-CM

## 2020-03-29 LAB — POCT INR: INR: 2.5 (ref 2.0–3.0)

## 2020-03-29 MED ORDER — ENOXAPARIN SODIUM 120 MG/0.8ML ~~LOC~~ SOLN: 120.0000 mg | SUBCUTANEOUS | 0 refills | Status: DC

## 2020-03-29 MED FILL — CYCLOBENZAPRINE HCL 10 MG T: 10 | 90 days supply | Qty: 90 | Fill #2

## 2020-03-29 MED FILL — ENOXAPARIN 120 MG/0.8 ML SY: 120 | 3 days supply | Qty: 2 | Fill #0

## 2020-03-29 NOTE — Patient Instructions (Addendum)
Pre visit review using our clinic review tool, if applicable. No additional management support is needed unless otherwise documented below in the visit note.  Continue to take 6 mg daily except 7 mg on Mondays and 9 mg on Wed and Fridays.  Re-check 4 weeks.  Please follow patient instructions.  11/8 - No coumadin today 11/9 - lovenox in the am 11/10 - Lovenox in the AM 11/11 - Lovenox in the AM 11/12 - Procedure (No Lovenox today ) 11/13 - Lovenox in the AM and 2 tablets of coumadin (12 mg) 11/14 - Lovenox in the AM and 2 tablets of coumadin 11/15 - Lovenox in the AM and 2 tablets of coumadin 11/16 - Lovenox in the AM and 1 1/2 tablets of coumadin

## 2020-03-31 ENCOUNTER — Ambulatory Visit: Payer: 59

## 2020-04-01 ENCOUNTER — Ambulatory Visit (INDEPENDENT_AMBULATORY_CARE_PROVIDER_SITE_OTHER): Payer: 59 | Admitting: General Practice

## 2020-04-01 ENCOUNTER — Other Ambulatory Visit: Payer: Self-pay

## 2020-04-01 DIAGNOSIS — Z7901 Long term (current) use of anticoagulants: Secondary | ICD-10-CM | POA: Diagnosis not present

## 2020-04-01 LAB — POCT INR: INR: 1.3 — AB (ref 2.0–3.0)

## 2020-04-01 NOTE — Patient Instructions (Addendum)
Pre visit review using our clinic review tool, if applicable. No additional management support is needed unless otherwise documented below in the visit note.  Continue to take 6 mg daily except 7 mg on Mondays and 9 mg on Wed and Fridays.  Patient is having an epidural tomorrow (11/12).  Pt instructions for lovenox bridge and dosing post procedure.

## 2020-04-02 ENCOUNTER — Ambulatory Visit
Admission: RE | Admit: 2020-04-02 | Discharge: 2020-04-02 | Disposition: A | Discharge status: Home / Self Care | Payer: 59 | Source: Ambulatory Visit | Attending: Family Medicine | Admitting: Family Medicine

## 2020-04-02 DIAGNOSIS — M5416 Radiculopathy, lumbar region: Secondary | ICD-10-CM | POA: Diagnosis not present

## 2020-04-02 MED ORDER — IOPAMIDOL (ISOVUE-M 200) INJECTION 41%
1.0000 mL | Freq: Once | INTRAMUSCULAR | Status: AC
  Administered 2020-04-02: 1 mL via EPIDURAL

## 2020-04-02 MED ORDER — METHYLPREDNISOLONE ACETATE 40 MG/ML INJ SUSP (RADIOLOG
120.0000 mg | Freq: Once | INTRAMUSCULAR | Status: AC
  Administered 2020-04-02: 120 mg via EPIDURAL

## 2020-04-02 NOTE — Discharge Instructions (Signed)

## 2020-04-20 ENCOUNTER — Other Ambulatory Visit: Payer: Self-pay | Admitting: Family Medicine

## 2020-04-20 ENCOUNTER — Other Ambulatory Visit: Payer: Self-pay | Admitting: General Practice

## 2020-04-20 DIAGNOSIS — Z7901 Long term (current) use of anticoagulants: Secondary | ICD-10-CM

## 2020-04-20 DIAGNOSIS — D6859 Other primary thrombophilia: Secondary | ICD-10-CM

## 2020-04-20 MED FILL — FLUoxetine HCL 20 MG TABS: 20 | 90 days supply | Qty: 90 | Fill #3

## 2020-04-20 MED FILL — ROSUVASTATIN CALCIUM 5 MG T: 5 | 90 days supply | Qty: 90 | Fill #1

## 2020-04-20 MED FILL — WARFARIN SODIUM 6 MG TABLET: 6 | 90 days supply | Qty: 90 | Fill #0

## 2020-04-23 ENCOUNTER — Other Ambulatory Visit: Payer: Self-pay

## 2020-04-23 ENCOUNTER — Other Ambulatory Visit: Payer: 59

## 2020-04-23 DIAGNOSIS — Z7901 Long term (current) use of anticoagulants: Secondary | ICD-10-CM

## 2020-04-24 LAB — PROTIME-INR
INR: 2.2 — ABNORMAL HIGH
Prothrombin Time: 23.1 s — ABNORMAL HIGH (ref 9.0–11.5)

## 2020-04-26 ENCOUNTER — Ambulatory Visit (INDEPENDENT_AMBULATORY_CARE_PROVIDER_SITE_OTHER): Payer: 59 | Admitting: General Practice

## 2020-04-26 DIAGNOSIS — Z7901 Long term (current) use of anticoagulants: Secondary | ICD-10-CM

## 2020-04-26 NOTE — Patient Instructions (Signed)
Pre visit review using our clinic review tool, if applicable. No additional management support is needed unless otherwise documented below in the visit note.  Change dosage and take 1 tablet (6 mg) all days except take 1 1/2 tablets (9 mg) on Mon Wed and Saturdays.  Re-check INR in 4 weeks.

## 2020-04-27 MED FILL — CILOSTAZOL 50 MG TABLET: 50 | 90 days supply | Qty: 180 | Fill #2

## 2020-05-04 MED FILL — CARVEDILOL 6.25 MG TABLET: 6.25 | 30 days supply | Qty: 60 | Fill #4

## 2020-05-17 ENCOUNTER — Telehealth: Payer: Self-pay | Admitting: Family Medicine

## 2020-05-17 NOTE — Telephone Encounter (Signed)
Left message to call back  

## 2020-05-17 NOTE — Telephone Encounter (Signed)
Patient had an epidural in November. His low back pain is better but no relief for the right leg. The leg discomfort is when he is standing or walking and is mostly in the hip, butt, and thigh area. He does feel some relief when he is sitting or laying down. His last ABI was done in September 2021.  Does patient need an office visit or would you recommend another MRI or epidural?

## 2020-05-17 NOTE — Telephone Encounter (Signed)
I would suggest one more epidural before the MRI I think   The other idea would be a nerve conduction study to see if it is coming from the back

## 2020-05-18 ENCOUNTER — Other Ambulatory Visit: Payer: Self-pay | Admitting: Family Medicine

## 2020-05-18 ENCOUNTER — Other Ambulatory Visit: Payer: Self-pay

## 2020-05-18 ENCOUNTER — Other Ambulatory Visit: Payer: Self-pay | Admitting: General Practice

## 2020-05-18 DIAGNOSIS — G8929 Other chronic pain: Secondary | ICD-10-CM

## 2020-05-18 DIAGNOSIS — Z7901 Long term (current) use of anticoagulants: Secondary | ICD-10-CM

## 2020-05-18 DIAGNOSIS — M2550 Pain in unspecified joint: Secondary | ICD-10-CM

## 2020-05-18 NOTE — Telephone Encounter (Signed)
Epidural ordered. Patient notified.  

## 2020-05-19 ENCOUNTER — Other Ambulatory Visit: Payer: Self-pay

## 2020-05-19 DIAGNOSIS — G8929 Other chronic pain: Secondary | ICD-10-CM

## 2020-05-19 DIAGNOSIS — M5441 Lumbago with sciatica, right side: Secondary | ICD-10-CM

## 2020-05-19 NOTE — Telephone Encounter (Signed)
Patient would like to proceed with the Nerve Conduction study before he has another epidural. He asked if it would be possible for Dr Katrinka Blazing to send in a round of steroids to help him in the mean time like he has done before?

## 2020-05-19 NOTE — Telephone Encounter (Signed)
Order EMG RLE I think the prednisone can help but will use sparingly with blood thinner. Lets hold until we see when we can get the nerve conduction. If it is longer then 2 weeks then we will do prednisone 20-40 mg daily for 5 days but may need an extra coumadin check  May need labs again to check iron to make sure it is not playing a role.

## 2020-06-02 ENCOUNTER — Other Ambulatory Visit: Payer: Self-pay | Admitting: Family Medicine

## 2020-06-04 ENCOUNTER — Other Ambulatory Visit: Payer: Self-pay

## 2020-06-04 ENCOUNTER — Other Ambulatory Visit (INDEPENDENT_AMBULATORY_CARE_PROVIDER_SITE_OTHER): Payer: 59

## 2020-06-04 ENCOUNTER — Telehealth: Payer: Self-pay | Admitting: Family Medicine

## 2020-06-04 DIAGNOSIS — M2550 Pain in unspecified joint: Secondary | ICD-10-CM | POA: Diagnosis not present

## 2020-06-04 DIAGNOSIS — Z7901 Long term (current) use of anticoagulants: Secondary | ICD-10-CM

## 2020-06-04 LAB — CBC WITH DIFFERENTIAL/PLATELET
Basophils Absolute: 0.1 10*3/uL (ref 0.0–0.1)
Basophils Relative: 1 % (ref 0.0–3.0)
Eosinophils Absolute: 0.5 10*3/uL (ref 0.0–0.7)
Eosinophils Relative: 3.2 % (ref 0.0–5.0)
HCT: 52.5 % — ABNORMAL HIGH (ref 39.0–52.0)
Hemoglobin: 17.8 g/dL — ABNORMAL HIGH (ref 13.0–17.0)
Lymphocytes Relative: 22.3 % (ref 12.0–46.0)
Lymphs Abs: 3.1 10*3/uL (ref 0.7–4.0)
MCHC: 33.8 g/dL (ref 30.0–36.0)
MCV: 97.4 fl (ref 78.0–100.0)
Monocytes Absolute: 0.7 10*3/uL (ref 0.1–1.0)
Monocytes Relative: 4.8 % (ref 3.0–12.0)
Neutro Abs: 9.6 10*3/uL — ABNORMAL HIGH (ref 1.4–7.7)
Neutrophils Relative %: 68.7 % (ref 43.0–77.0)
Platelets: 239 10*3/uL (ref 150.0–400.0)
RBC: 5.4 Mil/uL (ref 4.22–5.81)
RDW: 13.7 % (ref 11.5–15.5)
WBC: 14 10*3/uL — ABNORMAL HIGH (ref 4.0–10.5)

## 2020-06-04 LAB — COMPREHENSIVE METABOLIC PANEL
ALT: 26 U/L (ref 0–53)
AST: 19 U/L (ref 0–37)
Albumin: 4.4 g/dL (ref 3.5–5.2)
Alkaline Phosphatase: 98 U/L (ref 39–117)
BUN: 10 mg/dL (ref 6–23)
CO2: 23 mEq/L (ref 19–32)
Calcium: 9.8 mg/dL (ref 8.4–10.5)
Chloride: 102 mEq/L (ref 96–112)
Creatinine, Ser: 1 mg/dL (ref 0.40–1.50)
GFR: 86.29 mL/min (ref >60.00)
Glucose, Bld: 107 mg/dL — ABNORMAL HIGH (ref 70–99)
Potassium: 3.5 mEq/L (ref 3.5–5.1)
Sodium: 137 mEq/L (ref 135–145)
Total Bilirubin: 1 mg/dL (ref 0.2–1.2)
Total Protein: 8 g/dL (ref 6.0–8.3)

## 2020-06-04 LAB — IBC PANEL
Iron: 60 ug/dL (ref 42–165)
Saturation Ratios: 14.9 % — ABNORMAL LOW (ref 20.0–50.0)
Transferrin: 287 mg/dL (ref 212.0–360.0)

## 2020-06-04 LAB — VITAMIN B12: Vitamin B-12: 471 pg/mL (ref 211–911)

## 2020-06-04 LAB — PROTIME-INR
INR: 5.9 ratio (ref 0.8–1.0)
Prothrombin Time: 64.5 s (ref 9.6–13.1)

## 2020-06-04 LAB — TSH: TSH: 1.66 u[IU]/mL (ref 0.35–4.50)

## 2020-06-04 LAB — FERRITIN: Ferritin: 71 ng/mL (ref 22.0–322.0)

## 2020-06-04 NOTE — Telephone Encounter (Signed)
Lab Results  Component Value Date   INR 5.9 (HH) 06/04/2020   INR 2.2 (H) 04/23/2020   INR 1.3 (A) 04/01/2020   I got a call about [name]'s INR of 5.9  We spoke on the telephone.  He is not having any bleeding.  I asked him to hold his evening Coumadin dose today and tomorrow.  With offices closed on Monday, I am going to have him restart 6 mg on Sunday and Monday.  Should be rechecked on Tuesday.    Will alert Cindy and Dr. Jonni Sanger

## 2020-06-05 MED FILL — CARVEDILOL 6.25 MG TABLET: 6.25 | 30 days supply | Qty: 60 | Fill #5

## 2020-06-08 ENCOUNTER — Ambulatory Visit (INDEPENDENT_AMBULATORY_CARE_PROVIDER_SITE_OTHER): Payer: 59 | Admitting: General Practice

## 2020-06-08 DIAGNOSIS — Z7901 Long term (current) use of anticoagulants: Secondary | ICD-10-CM | POA: Diagnosis not present

## 2020-06-08 NOTE — Patient Instructions (Signed)
Pre visit review using our clinic review tool, if applicable. No additional management support is needed unless otherwise documented below in the visit note. Please skip today and tomorrow and change dosage and take 1 tablet (6 mg) all days except take 1 1/2 tablets (9 mg) on Mon and Thursdays.  Re-check INR in 10 to 14 days.

## 2020-06-08 NOTE — Progress Notes (Signed)
Medical screening examination/treatment/procedure(s) were performed by non-physician practitioner and as supervising physician I was immediately available for consultation/collaboration. I agree with above. Leiann Sporer, MD   

## 2020-06-08 NOTE — Telephone Encounter (Signed)
Hi Dr. Lorelei Pont!  I got your note about the INR.  I will  take of it!  Thanks, Villa Herb, RN

## 2020-06-10 ENCOUNTER — Ambulatory Visit (INDEPENDENT_AMBULATORY_CARE_PROVIDER_SITE_OTHER): Payer: 59 | Admitting: General Practice

## 2020-06-10 ENCOUNTER — Other Ambulatory Visit: Payer: Self-pay

## 2020-06-10 ENCOUNTER — Encounter: Payer: Managed Care, Other (non HMO) | Admitting: Diagnostic Neuroimaging

## 2020-06-10 DIAGNOSIS — Z7901 Long term (current) use of anticoagulants: Secondary | ICD-10-CM | POA: Diagnosis not present

## 2020-06-10 LAB — POCT INR: INR: 2.1 (ref 2.0–3.0)

## 2020-06-10 MED FILL — BREZTRI AEROSPHERE 160-9-4.: 160-9-4.8 | 30 days supply | Qty: 11 | Fill #3

## 2020-06-10 NOTE — Progress Notes (Signed)
Medical screening examination/treatment/procedure(s) were performed by non-physician practitioner and as supervising physician I was immediately available for consultation/collaboration. I agree with above. Makeba Delcastillo, MD   

## 2020-06-10 NOTE — Patient Instructions (Signed)
Pre visit review using our clinic review tool, if applicable. No additional management support is needed unless otherwise documented below in the visit note.  Take 1 tablet (6 mg) all days except take 1 1/2 tablets (9 mg) on Mon and Thursdays.  Re-check INR in 10 to 14 days.

## 2020-06-18 ENCOUNTER — Other Ambulatory Visit: Payer: Self-pay

## 2020-06-18 ENCOUNTER — Ambulatory Visit (HOSPITAL_COMMUNITY): Payer: 59 | Attending: Cardiology

## 2020-06-18 DIAGNOSIS — I4891 Unspecified atrial fibrillation: Secondary | ICD-10-CM | POA: Diagnosis not present

## 2020-06-18 DIAGNOSIS — I429 Cardiomyopathy, unspecified: Secondary | ICD-10-CM | POA: Insufficient documentation

## 2020-06-18 DIAGNOSIS — I493 Ventricular premature depolarization: Secondary | ICD-10-CM | POA: Diagnosis not present

## 2020-06-18 LAB — ECHOCARDIOGRAM COMPLETE
Area-P 1/2: 2.87 cm2
S' Lateral: 3.1 cm

## 2020-06-21 ENCOUNTER — Ambulatory Visit (INDEPENDENT_AMBULATORY_CARE_PROVIDER_SITE_OTHER): Payer: 59 | Admitting: General Practice

## 2020-06-21 ENCOUNTER — Other Ambulatory Visit: Payer: Self-pay

## 2020-06-21 ENCOUNTER — Ambulatory Visit: Payer: 59

## 2020-06-21 DIAGNOSIS — Z7901 Long term (current) use of anticoagulants: Secondary | ICD-10-CM | POA: Diagnosis not present

## 2020-06-21 LAB — POCT INR: INR: 2.1 (ref 2.0–3.0)

## 2020-06-21 NOTE — Patient Instructions (Signed)
Pre visit review using our clinic review tool, if applicable. No additional management support is needed unless otherwise documented below in the visit note. 

## 2020-07-02 ENCOUNTER — Encounter: Payer: Self-pay | Admitting: Cardiology

## 2020-07-02 ENCOUNTER — Other Ambulatory Visit: Payer: Self-pay

## 2020-07-02 ENCOUNTER — Ambulatory Visit (INDEPENDENT_AMBULATORY_CARE_PROVIDER_SITE_OTHER): Payer: 59 | Admitting: Cardiology

## 2020-07-02 VITALS — BP 128/82 | HR 87 | Ht 69.0 in | Wt 186.2 lb

## 2020-07-02 DIAGNOSIS — I493 Ventricular premature depolarization: Secondary | ICD-10-CM | POA: Diagnosis not present

## 2020-07-02 DIAGNOSIS — I429 Cardiomyopathy, unspecified: Secondary | ICD-10-CM

## 2020-07-02 DIAGNOSIS — I428 Other cardiomyopathies: Secondary | ICD-10-CM

## 2020-07-02 NOTE — Patient Instructions (Signed)
Medication Instructions:  Your physician recommends that you continue on your current medications as directed. Please refer to the Current Medication list given to you today.  *If you need a refill on your cardiac medications before your next appointment, please call your pharmacy*   Lab Work: None ordered   Testing/Procedures: Your physician has requested that you have an echocardiogram in one year - PRIOR to your follow up with Dr. Curt Bears. Echocardiography is a painless test that uses sound waves to create images of your heart. It provides your doctor with information about the size and shape of your heart and how well your heart's chambers and valves are working. This procedure takes approximately one hour. There are no restrictions for this procedure.    Follow-Up: At [facility], you and your health needs are our priority.  As part of our continuing mission to provide you with exceptional heart care, we have created designated Provider Care Teams.  These Care Teams include your primary Cardiologist (physician) and Advanced Practice Providers (APPs -  Physician Assistants and Nurse Practitioners) who all work together to provide you with the care you need, when you need it.   Your next appointment:   1 year(s)  The format for your next appointment:   In Person  Provider:   Allegra Lai, MD    Thank you for choosing [facility]!!   Trinidad Curet, RN [phone]

## 2020-07-02 NOTE — Progress Notes (Signed)
Electrophysiology Office Note   Date:  07/02/2020   ID:  Ernesto Zukowski, DOB [birth year], MRN [id number]  PCP:  Leamon Arnt, MD  Cardiologist:  Debara Pickett Primary Electrophysiologist:  Ameliyah Sarno Meredith Leeds, MD    Chief Complaint: PVC   History of Present Illness: [name] is a 55 y.o. male who is being seen today for the evaluation of PVC at the request of Leamon Arnt, MD. Presenting today for electrophysiology evaluation.  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  He was previously on mexiletine for his PVCs.  His ejection fraction normalized.  He started having side effects on mexiletine which has since been stopped.  Ejection fraction is remained stable.  Today, denies symptoms of palpitations, chest pain, shortness of breath, orthopnea, PND, lower extremity edema, claudication, dizziness, presyncope, syncope, bleeding, or neurologic sequela. The patient is tolerating medications without difficulties.  Since last being seen he has done well.  Fortunately he had an echo that showed that his ejection fraction has remained stable.  He has no chest pain or shortness of breath.  Is able do all of his daily activities without restriction.   Past Medical History:  Diagnosis Date  . Anticoagulated on Coumadin   . Antiphospholipid antibody positive, followed by Dr. Irene Limbo, Hematology 06/27/2016  . Anxiety with depression 06/24/2014  . Chronic back pain   . ED (erectile dysfunction)   . GERD (gastroesophageal reflux disease)   . Hemorrhoids, internal, with bleeding   . Hiatal hernia   . History of adenomatous polyp of colon, 07/08/16, tubular adenomas   . History of atrial fibrillation, episode 08/2016   . History of cardiomyopathy ([place]) 08/04/2018   ECHO 12/02/18  1. The left ventricle has normal systolic function with an ejection fraction of  60-65%. The cavity size was normal. Left ventricular diastolic parameters were normal.  2. The right ventricle has normal systolic function. The cavity was normal. There is no increase in right ventricular wall thickness.  3. The mitral valve is grossly normal.  4. The aortic valve is tricuspid. Mild thic  . History of DVT of lower extremity   . Hyperlipidemia   . Iron deficiency anemia due to chronic blood loss, thought to be from hemorrhoids while on coumadin, s/p iron infusion and blood transfusion x 2, 2018 2018  . Low testosterone in male 05/04/2017   2018, 2019: Decision would be risk vs benefit. Could increase thrombotic risk especially if he has a polycythemic response. If he decided to pursue this with an understanding of the risk - would use lowest dose of testosterone to maintain level low normal 300-400.  2020: Secondary polycythemia. Not a candidate.  . 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 Nicotine dependence   . OA (osteoarthritis)   . PAD (peripheral artery disease) ([place]), followed by Dr Bridgett Larsson    hx right CIA angioplasty and stent 02-24-2016/right CIA thromectomy and patch angioplasty for restenosis 07-18-2015  . PVCs (premature ventricular contractions)   . 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 S/P insertion of iliac artery stent 2018   02-23-2017  right CIA PTA and stent/05-11-2016 in-stent restenosis s/p thrombectomy/2018 thrombectomy and  patch angioplasty right femoral artery    Past Surgical History:  Procedure Laterality Date  . ANGIOPLASTY ILLIAC ARTERY Right 07/17/2016   Procedure: ANGIOPLASTY RIGHT COMMON ILIAC ARTERY;  Surgeon: Jannette Fogo  Bridgett Larsson, MD;  Location: [place];  Service: Vascular;  Laterality: Right;  . ANTERIOR CERVICAL DECOMP/DISCECTOMY FUSION  07/21/2010   C5 -- C7  . CARDIOVASCULAR STRESS TEST  06-14-2016   dr hilty   normal perfusion study w/ no reversible ischemia/  stress ef 44% but visually looks better , echo ordered  (LVEF 30-44%)  , normal LV wall motion   . COLONOSCOPY N/A 07/08/2016   Procedure: COLONOSCOPY;  Surgeon: Clarene Essex, MD;  Location: [facility];  Service: Endoscopy;  Laterality: N/A;  . ESOPHAGOGASTRODUODENOSCOPY N/A 07/08/2016   Procedure: ESOPHAGOGASTRODUODENOSCOPY (EGD);  Surgeon: Clarene Essex, MD;  Location: [facility];  Service: Endoscopy;  Laterality: N/A;  . GROIN DEBRIDEMENT Right 07/25/2016   Procedure: EVACUATION HEMATOMA RIGHT GROIN;  Surgeon: Waynetta Sandy, MD;  Location: [place];  Service: Vascular;  Laterality: Right;  . HEMORRHOID SURGERY N/A 12/07/2016   Procedure: 3 COLUMN HEMORRHOIDECTOMY;  Surgeon: Leighton Ruff, MD;  Location: [facility];  Service: General;  Laterality: N/A;  . INTRAOPERATIVE ARTERIOGRAM Right 07/17/2016   Procedure: INTRA OPERATIVE ANGIOGRAM OF RIGHT COMMON ILIAC ARTERY;  Surgeon: Conrad Peach, MD;  Location: [place];  Service: Vascular;  Laterality: Right;  . KNEE SURGERY Right 1983   cartilage  . PATCH ANGIOPLASTY Right 07/17/2016   Procedure: PATCH ANGIOPLASTY RIGHT FEMORAL ARTERY USING Rueben Bash BIOLOGIC PATCH;  Surgeon: Conrad Leeper, MD;  Location: [place];  Service: Vascular;  Laterality: Right;  . PERIPHERAL VASCULAR CATHETERIZATION N/A 02/24/2016   Procedure: Abdominal Aortogram w/Lower Extremity;  Surgeon: Conrad Verona, MD;  Location: [place] [facility];  Service: Cardiovascular;  Laterality: N/A;  . PERIPHERAL VASCULAR CATHETERIZATION Right 02/24/2016   Procedure: Peripheral Vascular Intervention;  Surgeon: Conrad Esbon, MD;  Location: [place] [facility];  Service: Cardiovascular;  Laterality: Right;  Common iliac  . PERIPHERAL VASCULAR CATHETERIZATION N/A 05/11/2016   Procedure: Abdominal Aortogram;  Surgeon: Conrad Inverness, MD;  Location: [place] [facility];  Service: Cardiovascular;  Laterality: N/A;  . PERIPHERAL VASCULAR CATHETERIZATION N/A 05/11/2016   Procedure: Lower Extremity Angiography;  Surgeon: Conrad New Haven, MD;   Location: [place] [facility];  Service: Cardiovascular;  Laterality: N/A;  . POSTERIOR CERVICAL FUSION/FORAMINOTOMY N/A 07/05/2018   Procedure: Posterior cervical fusion with lateral mass fixation - Cervical five-Cervical six, Cervical six-Cervical seven;  Surgeon: Earnie Larsson, MD;  Location: [place];  Service: Neurosurgery;  Laterality: N/A;  . THROMBECTOMY FEMORAL ARTERY Right 07/17/2016   Procedure: THROMBECTOMY RIGHT FEMORAL ARTERY;  Surgeon: Conrad Stidham, MD;  Location: [place];  Service: Vascular;  Laterality: Right;  . TRANSTHORACIC ECHOCARDIOGRAM  06-20-2016   dr hilty   EF 55-60%,  grade 1 diastolic dysfunction, mild TR     Current Outpatient Medications  Medication Sig Dispense Refill  . Budeson-Glycopyrrol-Formoterol (BREZTRI AEROSPHERE) 160-9-4.8 MCG/ACT AERO Inhale 2 puffs into the lungs in the morning and at bedtime. 10.7 g 5  . carvedilol (COREG) 6.25 MG tablet TAKE 1 TABLET BY MOUTH TWICE DAILY 60 tablet 11  . cilostazol (PLETAL) 50 MG tablet Take 1 tablet (50 mg total) by mouth 2 (two) times daily. 180 tablet 3  . cyclobenzaprine (FLEXERIL) 10 MG tablet TAKE 1 TABLET BY MOUTH AT BEDTIME AS NEEDED FOR MUSCLE SPASMS 90 tablet 3  . esomeprazole (NEXIUM) 20 MG capsule Take 20 mg by mouth daily at 12 noon.    Marland Kitchen FLUoxetine (PROZAC) 20 MG tablet Take 1 tablet (20 mg total) by mouth daily. 90 tablet 3  .  gabapentin (NEURONTIN) 600 MG tablet Take 1 tablet (600 mg total) by mouth 3 (three) times daily. 270 tablet 3  . Gabapentin Enacarbil (HORIZANT) 600 MG TBCR Take 1 tablet (600 mg total) by mouth at bedtime. 90 tablet 1  . rosuvastatin (CRESTOR) 5 MG tablet Take 1 tablet (5 mg total) by mouth daily. 90 tablet 3  . warfarin (COUMADIN) 6 MG tablet TAKE 1 TABLET BY MOUTH ONCE DAILY OR AS DIRECTED BY ANTICOAGULATION CLINIC 90 tablet 1   No current facility-administered medications for this visit.    Allergies:   Cymbalta [duloxetine hcl]   Social History:  The patient  reports that he has  been smoking cigarettes and e-cigarettes. He has a 16.50 pack-year smoking history. He has never used smokeless tobacco. He reports that he does not drink alcohol and does not use drugs.   Family History:  The patient's family history includes Lung cancer in his father.   ROS:  Please see the history of present illness.   Otherwise, review of systems is positive for none.   All other systems are reviewed and negative.   PHYSICAL EXAM: VS:  BP 128/82   Pulse 87   Ht 5\' 9"  (1.753 m)   Wt 186 lb 3.2 oz (84.5 kg)   SpO2 95%   BMI 27.50 kg/m  , BMI Body mass index is 27.5 kg/m. GEN: Well nourished, well developed, in no acute distress  HEENT: normal  Neck: no JVD, carotid bruits, or masses Cardiac: irregularRRR; no murmurs, rubs, or gallops,no edema  Respiratory:  clear to auscultation bilaterally, normal work of breathing GI: soft, nontender, nondistended, + BS MS: no deformity or atrophy  Skin: warm and dry Neuro:  Strength and sensation are intact Psych: euthymic mood, full affect  EKG:  EKG is ordered today. Personal review of the ekg ordered shows sinus rhythm, PVCs  Recent Labs: 06/04/2020: ALT 26; BUN 10; Creatinine, Ser 1.00; Hemoglobin 17.8; Platelets 239.0; Potassium 3.5; Sodium 137; TSH 1.66    Lipid Panel     Component Value Date/Time   CHOL 136 01/30/2019 0855   TRIG 121.0 01/30/2019 0855   HDL 31.70 (L) 01/30/2019 0855   CHOLHDL 4 01/30/2019 0855   VLDL 24.2 01/30/2019 0855   LDLCALC 80 01/30/2019 0855     Wt Readings from Last 3 Encounters:  07/02/20 186 lb 3.2 oz (84.5 kg)  02/27/20 180 lb (81.6 kg)  02/10/20 176 lb 14.4 oz (80.2 kg)      Other studies Reviewed: Additional studies/ records that were reviewed today include: TTE 06/18/2020 Review of the above records today demonstrates:  1. PVC's noted. Left ventricular ejection fraction, by estimation, is 55  to 60%. The left ventricle has normal function. The left ventricle has no  regional wall  motion abnormalities. Left ventricular diastolic parameters  are consistent with Grade I  diastolic dysfunction (impaired relaxation).  2. Right ventricular systolic function is normal. The right ventricular  size is normal.  3. The mitral valve is normal in structure. No evidence of mitral valve  regurgitation. No evidence of mitral stenosis.  4. The aortic valve is normal in structure. Aortic valve regurgitation is  not visualized. No aortic stenosis is present.  5. The inferior vena cava is normal in size with greater than 50%  respiratory variability, suggesting right atrial pressure of 3 mmHg.    ASSESSMENT AND PLAN:  1.  PVCs: Has been taken off of mexiletine due to side effects.  Repeat echo after 6  months shows a normal ejection fraction.  At this point, he is happy off of medications.  Due to that, we'll plan to repeat his echo in 1 year.  I Troi Florendo see him back at that point.  If he does develop symptoms, ablation or alternative medications would be reasonable.  2.  Nonischemic cardiomyopathy: Ejection fraction has normalized.  Plan per primary cardiology.    Current medicines are reviewed at length with the patient today.   The patient does not have concerns regarding his medicines.  The following changes were made today: None  Labs/ tests ordered today include:  Orders Placed This Encounter  Procedures  . EKG 12-Lead  . ECHOCARDIOGRAM COMPLETE     Disposition:   FU with Dreama Kuna 12 months  Signed, Vearl Allbaugh Meredith Leeds, MD  07/02/2020 12:04 PM     [place] [address] [phone] (office) [phone] (fax)

## 2020-07-05 ENCOUNTER — Ambulatory Visit (INDEPENDENT_AMBULATORY_CARE_PROVIDER_SITE_OTHER): Payer: 59 | Admitting: Neurology

## 2020-07-05 ENCOUNTER — Encounter (INDEPENDENT_AMBULATORY_CARE_PROVIDER_SITE_OTHER): Payer: 59 | Admitting: Neurology

## 2020-07-05 DIAGNOSIS — M5441 Lumbago with sciatica, right side: Secondary | ICD-10-CM | POA: Diagnosis not present

## 2020-07-05 DIAGNOSIS — Z0289 Encounter for other administrative examinations: Secondary | ICD-10-CM

## 2020-07-05 DIAGNOSIS — G8929 Other chronic pain: Secondary | ICD-10-CM

## 2020-07-05 MED FILL — CARVEDILOL 6.25 MG TABLET: 6.25 | 30 days supply | Qty: 60 | Fill #6

## 2020-07-05 NOTE — Procedures (Signed)
Full Name: Nickholas Goldston Gender: Male MRN #: [id number] Date of Birth: March 27, 1966    Visit Date: 07/05/2020 09:37 Age: 55 Years Examining Physician: Marcial Pacas, MD  Referring Physician: Hulan Saas, DO History: 55 year old male with history of cervical decompression surgery, chronic low back pain, complains of worsening pain radiating from right back to right lateral thigh.  Examination: Bilateral lower extremity motor and sensory examination were normal.  Hyperactive at patellar, present ankle reflex, plantar responses were flexor  Summary of the test: Nerve conduction study: Right sural, superficial peroneal sensory responses were normal.  Right peroneal to EDB and tibial motor responses were normal.  Bilateral tibial H reflex were normal and symmetric  Electromyography: Selected needle examination of bilateral lower extremity muscles showed mild chronic neuropathic changes involving bilateral tibialis anterior, tibialis posterior, peroneal longus.  There was no spontaneous activity at bilateral lumbosacral paraspinal muscles.  Conclusion: This is a mild abnormal study.  There is electrodiagnostic evidence of mild chronic neuropathic changes involving bilateral L4-5 myotomes, indicating chronic bilateral lumbosacral radiculopathy.  There is no evidence of active process.    ------------------------------- Marcial Pacas, M.D. PhD  [facility] [address], [place], [place] [address] Tel: [phone] Fax: [phone]  Verbal informed consent was obtained from the patient, patient was informed of potential risk of procedure, including bruising, bleeding, hematoma formation, infection, muscle weakness, muscle pain, numbness, among others.        [place]    Nerve / Sites Muscle Latency Ref. Amplitude Ref. Rel Amp Segments Distance Velocity Ref. Area    ms ms mV mV %  cm m/s m/s mVms  R Peroneal - EDB     Ankle EDB 5.6 ?6.5 6.8 ?2.0 100 Ankle -  EDB 9   25.1     Fib head EDB 12.2  4.9  71.6 Fib head - Ankle 29 44 ?44 17.8     Pop fossa EDB 14.5  6.4  132 Pop fossa - Fib head 10 44 ?44 24.1         Pop fossa - Ankle      R Tibial - AH     Ankle AH 4.5 ?5.8 12.4 ?4.0 100 Ankle - AH 9   25.4     Pop fossa AH 14.0  9.9  80 Pop fossa - Ankle 40 42 ?41 24.4         SNC    Nerve / Sites Rec. Site Peak Lat Ref.  Amp Ref. Segments Distance    ms ms V V  cm  R Sural - Ankle (Calf)     Calf Ankle 4.0 ?4.4 16 ?6 Calf - Ankle 14  R Superficial peroneal - Ankle     Lat leg Ankle 4.4 ?4.4 6 ?6 Lat leg - Ankle 14         F  Wave    Nerve F Lat Ref.   ms ms  R Tibial - AH 52.0 ?56.0       H Reflex    Nerve H Lat   ms   Left Right Ref.  Tibial - [place] 37.8 37.1 ?35.0         EMG Summary Table    Spontaneous MUAP Recruitment  Muscle IA Fib PSW Fasc Other Amp Dur. Poly Pattern  R. Tibialis anterior Normal None None None _______ Normal Normal Normal Reduced  R. Tibialis posterior Normal None None None _______ Normal Normal Normal Reduced  R. Peroneus  longus Normal None None None _______ Normal Normal Normal Reduced  R. Gastrocnemius (Medial head) Normal None None None _______ Normal Normal Normal Normal  R. Vastus lateralis Normal None None None _______ Normal Normal Normal Normal  L. Tibialis anterior Normal None None None _______ Normal Normal Normal Reduced  L. Tibialis posterior Normal None None None _______ Normal Normal Normal Reduced  L. Peroneus longus Normal None None None _______ Normal Normal Normal Reduced  L. Gastrocnemius (Medial head) Normal None None None _______ Normal Normal Normal Normal  L. Vastus lateralis Normal None None None _______ Normal Normal Normal Normal  R. Lumbar paraspinals (low) Normal None None None _______ Normal Normal Normal Normal  R. Lumbar paraspinals (mid) Normal None None None _______ Normal Normal Normal Normal  L. Lumbar paraspinals (low) Normal None None None _______ Normal Normal  Normal Normal  L. Lumbar paraspinals (mid) Normal None None None _______ Normal Normal Normal Normal

## 2020-07-08 NOTE — Progress Notes (Unsigned)
[facility] [address] [place] Phone: [phone] Subjective:   I Kandace Blitz am serving as a Education administrator for Dr. Hulan Saas.  This visit occurred during the SARS-CoV-2 public health emergency.  Safety protocols were in place, including screening questions prior to the visit, additional usage of staff PPE, and extensive cleaning of exam room while observing appropriate contact time as indicated for disinfecting solutions.   I'm seeing this patient by the request  of:  Leamon Arnt, MD  CC: Back pain, leg pain follow-up  [id number]   02/27/2020 Patient's most recent MRI does show that there has been some mild progression at the L4-L5 as well as at the L5-S1.  I do believe at this point we will try another epidural and because patient's pain is more centered around the L5 nerve root today we will go down to L5-S1 epidural and see if we make any significant improvement.  Patient given a trial of a long-acting gabapentin and we can make the change otherwise.  Patient will discontinue the other anti-inflammatories when possible.  We discussed we could do more prednisone if necessary but once again with patient being on the blood thinner and we want to avoid this regularly.  Increase activity slowly.  Follow-up again in 3 to 4 weeks after the epidural  Update 07/09/2020 Cashawn Yanko is a 55 y.o. male coming in with complaint of low back pain. Patient states his back is doing well. Still having issues with the right leg. States it feels like pressure. Feels like it goes numb.  Patient still has about being his main concern.  Feels like the back pain is significant improved after the epidural.     Nerve conduction study showed that patient does have mild neuropathic changes involving the bilateral L4-L5 myotome including the lumbosacral radiculopathy MRI from 2020 showed that there was slight progression of arthritic changes as well as disc  protrusion at the L4-L5. Patient's most recent ABIs in September of last year showed good blood flow but was falsely elevated..  Past Medical History:  Diagnosis Date  . Anticoagulated on Coumadin   . Antiphospholipid antibody positive, followed by Dr. Irene Limbo, Hematology 06/27/2016  . Anxiety with depression 06/24/2014  . Chronic back pain   . ED (erectile dysfunction)   . GERD (gastroesophageal reflux disease)   . Hemorrhoids, internal, with bleeding   . Hiatal hernia   . History of adenomatous polyp of colon, 07/08/16, tubular adenomas   . History of atrial fibrillation, episode 08/2016   . History of cardiomyopathy ([place]) 08/04/2018   ECHO 12/02/18  1. The left ventricle has normal systolic function with an ejection fraction of 60-65%. The cavity size was normal. Left ventricular diastolic parameters were normal.  2. The right ventricle has normal systolic function. The cavity was normal. There is no increase in right ventricular wall thickness.  3. The mitral valve is grossly normal.  4. The aortic valve is tricuspid. Mild thic  . History of DVT of lower extremity   . Hyperlipidemia   . Iron deficiency anemia due to chronic blood loss, thought to be from hemorrhoids while on coumadin, s/p iron infusion and blood transfusion x 2, 2018 2018  . Low testosterone in male 05/04/2017   2018, 2019: Decision would be risk vs benefit. Could increase thrombotic risk especially if he has a polycythemic response. If he decided to pursue this with an understanding of the risk - would use lowest dose  of testosterone to maintain level low normal 300-400.  2020: Secondary polycythemia. Not a candidate.  . 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 Nicotine dependence   . OA (osteoarthritis)   . PAD (peripheral artery disease) ([place]), followed by Dr Bridgett Larsson    hx right CIA angioplasty and stent 02-24-2016/right CIA thromectomy and patch angioplasty for restenosis 07-18-2015  . PVCs (premature ventricular  contractions)   . 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 S/P insertion of iliac artery stent 2018   02-23-2017  right CIA PTA and stent/05-11-2016 in-stent restenosis s/p thrombectomy/2018 thrombectomy and  patch angioplasty right femoral artery    Past Surgical History:  Procedure Laterality Date  . ANGIOPLASTY ILLIAC ARTERY Right 07/17/2016   Procedure: ANGIOPLASTY RIGHT COMMON ILIAC ARTERY;  Surgeon: Conrad Bow Mar, MD;  Location: [facility];  Service: Vascular;  Laterality: Right;  . ANTERIOR CERVICAL DECOMP/DISCECTOMY FUSION  07/21/2010   C5 -- C7  . CARDIOVASCULAR STRESS TEST  06-14-2016   dr hilty   normal perfusion study w/ no reversible ischemia/  stress ef 44% but visually looks better , echo ordered (LVEF 30-44%)  , normal LV wall motion   . COLONOSCOPY N/A 07/08/2016   Procedure: COLONOSCOPY;  Surgeon: Clarene Essex, MD;  Location: [facility];  Service: Endoscopy;  Laterality: N/A;  . ESOPHAGOGASTRODUODENOSCOPY N/A 07/08/2016   Procedure: ESOPHAGOGASTRODUODENOSCOPY (EGD);  Surgeon: Clarene Essex, MD;  Location: [facility];  Service: Endoscopy;  Laterality: N/A;  . GROIN DEBRIDEMENT Right 07/25/2016   Procedure: EVACUATION HEMATOMA RIGHT GROIN;  Surgeon: Waynetta Sandy, MD;  Location: [facility];  Service: Vascular;  Laterality: Right;  . HEMORRHOID SURGERY N/A 12/07/2016   Procedure: 3 COLUMN HEMORRHOIDECTOMY;  Surgeon: Leighton Ruff, MD;  Location: [facility];  Service: General;  Laterality: N/A;  . INTRAOPERATIVE ARTERIOGRAM Right 07/17/2016   Procedure: INTRA OPERATIVE ANGIOGRAM OF RIGHT COMMON ILIAC ARTERY;  Surgeon: Conrad Hopkins, MD;  Location: [facility];  Service: Vascular;  Laterality: Right;  . KNEE SURGERY Right 1983   cartilage  . PATCH ANGIOPLASTY Right 07/17/2016   Procedure: PATCH ANGIOPLASTY RIGHT FEMORAL ARTERY USING Rueben Bash BIOLOGIC PATCH;  Surgeon: Conrad Greenwater, MD;  Location: [facility];  Service: Vascular;  Laterality: Right;  .  PERIPHERAL VASCULAR CATHETERIZATION N/A 02/24/2016   Procedure: Abdominal Aortogram w/Lower Extremity;  Surgeon: Conrad [place]ke[place], MD;  Location: [facility];  Service: Cardiovascular;  Laterality: N/A;  . PERIPHERAL VASCULAR CATHETERIZATION Right 02/24/2016   Procedure: Peripheral Vascular Intervention;  Surgeon: Conrad [place], MD;  Location: [place] [facility];  Service: Cardiovascular;  Laterality: Right;  Common iliac  . PERIPHERAL VASCULAR CATHETERIZATION N/A 05/11/2016   Procedure: Abdominal Aortogram;  Surgeon: Conrad Almena, MD;  Location: [facility];  Service: Cardiovascular;  Laterality: N/A;  . PERIPHERAL VASCULAR CATHETERIZATION N/A 05/11/2016   Procedure: Lower Extremity Angiography;  Surgeon: Conrad Noonday, MD;  Location: [facility];  Service: Cardiovascular;  Laterality: N/A;  . POSTERIOR CERVICAL FUSION/FORAMINOTOMY N/A 07/05/2018   Procedure: Posterior cervical fusion with lateral mass fixation - Cervical five-Cervical six, Cervical six-Cervical seven;  Surgeon: Earnie Larsson, MD;  Location: [facility];  Service: Neurosurgery;  Laterality: N/A;  . THROMBECTOMY FEMORAL ARTERY Right 07/17/2016   Procedure: THROMBECTOMY RIGHT FEMORAL ARTERY;  Surgeon: Conrad , MD;  Location: [facility];  Service: Vascular;  Laterality: Right;  . TRANSTHORACIC ECHOCARDIOGRAM  06-20-2016   dr hilty   EF 55-60%,  grade 1 diastolic dysfunction, mild TR   Social History   Socioeconomic History  . Marital status: Married    Spouse name: Almyra Free  . Number of children: Not on file  . Years of education: Not on file  . Highest education level: Not on file  Occupational History  . Occupation: Disabled  Tobacco Use  . Smoking status: Current Every Day Smoker    Packs/day: 0.50    Years: 33.00    Pack years: 16.50    Types: Cigarettes, E-cigarettes  . Smokeless tobacco: Never Used  Vaping Use  . Vaping Use: Never used  Substance and Sexual Activity  . Alcohol use: No    Comment: Sober since  2017  . Drug use: No  . Sexual activity: Yes    Partners: Female  Other Topics Concern  . Not on file  Social History Narrative  . Not on file   Social Determinants of Health   Financial Resource Strain: Not on file  Food Insecurity: Not on file  Transportation Needs: Not on file  Physical Activity: Not on file  Stress: Not on file  Social Connections: Not on file   Allergies  Allergen Reactions  . Cymbalta [Duloxetine Hcl] Other (See Comments)    Headaches   Family History  Problem Relation Age of Onset  . Lung cancer Father      Current Outpatient Medications (Cardiovascular):  .  carvedilol (COREG) 6.25 MG tablet, TAKE 1 TABLET BY MOUTH TWICE DAILY .  rosuvastatin (CRESTOR) 5 MG tablet, Take 1 tablet (5 mg total) by mouth daily.  Current Outpatient Medications (Respiratory):  Marland Kitchen  Budeson-Glycopyrrol-Formoterol (BREZTRI AEROSPHERE) 160-9-4.8 MCG/ACT AERO, Inhale 2 puffs into the lungs in the morning and at bedtime.   Current Outpatient Medications (Hematological):  .  cilostazol (PLETAL) 50 MG tablet, Take 1 tablet (50 mg total) by mouth 2 (two) times daily. Marland Kitchen  warfarin (COUMADIN) 6 MG tablet, TAKE 1 TABLET BY MOUTH ONCE DAILY OR AS DIRECTED BY ANTICOAGULATION CLINIC  Current Outpatient Medications (Other):  .  cyclobenzaprine (FLEXERIL) 10 MG tablet, TAKE 1 TABLET BY MOUTH AT BEDTIME AS NEEDED FOR MUSCLE SPASMS .  esomeprazole (NEXIUM) 20 MG capsule, Take 20 mg by mouth daily at 12 noon. Marland Kitchen  FLUoxetine (PROZAC) 20 MG tablet, Take 1 tablet (20 mg total) by mouth daily. Marland Kitchen  gabapentin (NEURONTIN) 600 MG tablet, Take 1 tablet (600 mg total) by mouth 3 (three) times daily. .  Gabapentin Enacarbil (HORIZANT) 600 MG TBCR, Take 1 tablet (600 mg total) by mouth at bedtime.   Reviewed prior external information including notes and imaging from  primary care provider As well as notes that were available from care everywhere and other healthcare systems.  Past medical  history, social, surgical and family history all reviewed in electronic medical record.  No pertanent information unless stated regarding to the chief complaint.   Review of Systems:  No headache, visual changes, nausea, vomiting, diarrhea, constipation, dizziness, abdominal pain, skin rash, fevers, chills, night sweats, weight loss, swollen lymph nodes,, joint swelling, chest pain, shortness of breath, mood changes. POSITIVE muscle aches mild body aches   Objective  Blood pressure 130/80, pulse 87, height 5\' 9"  (1.753 m), weight 188 lb (85.3 kg), SpO2 97 %.   General: No apparent distress alert and oriented x3 mood and affect normal, dressed appropriately.  HEENT: Pupils equal, extraocular movements intact  Respiratory: Patient's speak in full sentences and does not appear short of breath  Cardiovascular: No lower extremity  edema, non tender, no erythema  Gait normal with good balance and coordination.  MSK: On exam patient is very comfortable in the chair sitting.  Does have neurologic feeling in the leg.  No significant weakness getting out of the chair.  Ambulates without any difficulty.  Seems to have symmetric strength of the lower extremities.    Impression and Recommendations:     The above documentation has been reviewed and is accurate and complete Lyndal Pulley, DO

## 2020-07-09 ENCOUNTER — Ambulatory Visit (INDEPENDENT_AMBULATORY_CARE_PROVIDER_SITE_OTHER): Payer: 59 | Admitting: Family Medicine

## 2020-07-09 ENCOUNTER — Other Ambulatory Visit: Payer: Self-pay

## 2020-07-09 ENCOUNTER — Ambulatory Visit: Payer: 59 | Admitting: Family Medicine

## 2020-07-09 ENCOUNTER — Encounter: Payer: Self-pay | Admitting: Family Medicine

## 2020-07-09 VITALS — BP 130/80 | HR 87 | Ht 69.0 in | Wt 188.0 lb

## 2020-07-09 DIAGNOSIS — G8929 Other chronic pain: Secondary | ICD-10-CM

## 2020-07-09 DIAGNOSIS — M5441 Lumbago with sciatica, right side: Secondary | ICD-10-CM | POA: Diagnosis not present

## 2020-07-09 DIAGNOSIS — M542 Cervicalgia: Secondary | ICD-10-CM

## 2020-07-09 DIAGNOSIS — M549 Dorsalgia, unspecified: Secondary | ICD-10-CM | POA: Diagnosis not present

## 2020-07-09 NOTE — Assessment & Plan Note (Signed)
Patient has had difficulty with the radicular symptoms still at this time.  Differential includes the vascular aspect but patient is scheduled for another ABI in 1 month and last one in September was doing well.  Patient does continue with the Pletal, gabapentin and is on Coumadin which does limit our options.  Patient's back pain is significantly better with the epidural but I do feel like repeating could be beneficial.  Patient's most recent MRI in 2020 did not show any true nerve impingement but we could consider either repeating imaging or potentially try a nerve root injection if this does not help.  Patient is in agreement with the plan follow-up with me again in 5 to 6 weeks after epidural

## 2020-07-09 NOTE — Patient Instructions (Addendum)
Good to see you Epidural L4-L5 Will have them go towards the right side No medicine changes right now Check in with me through your better half 3 weeks after injection Need to consider iron labs in 3-6 weeks

## 2020-07-12 ENCOUNTER — Other Ambulatory Visit: Payer: Self-pay | Admitting: Family Medicine

## 2020-07-12 MED FILL — WARFARIN SODIUM 6 MG TABLET: 6 | 90 days supply | Qty: 90 | Fill #1

## 2020-07-15 ENCOUNTER — Other Ambulatory Visit: Payer: Self-pay | Admitting: Family Medicine

## 2020-07-15 MED FILL — FLUOXETINE HCL 20 MG TABS: 20 | 90 days supply | Qty: 90 | Fill #0

## 2020-07-18 MED FILL — ROSUVASTATIN CALCIUM 5 MG T: 5 | 90 days supply | Qty: 90 | Fill #2

## 2020-07-19 ENCOUNTER — Ambulatory Visit: Payer: 59

## 2020-07-20 ENCOUNTER — Telehealth: Payer: Self-pay

## 2020-07-20 ENCOUNTER — Other Ambulatory Visit: Payer: Self-pay | Admitting: Family Medicine

## 2020-07-20 NOTE — Telephone Encounter (Signed)
[name] is calling in from [facility], they faxed over a form for the patient to stop warfarin (COUMADIN) 6 MG tablet for 4 days before they schedule injection, but has had no response. Patient is calling them to schedule but they are unable to schedule anything until they have that form.

## 2020-07-20 NOTE — Telephone Encounter (Signed)
Called and LM for [place]. Last form I received was on 05/18/20 which covered injections for 6 months. I have refaxed along with the fax confirmation from original fax.

## 2020-07-21 ENCOUNTER — Encounter: Payer: Self-pay | Admitting: Family Medicine

## 2020-07-22 NOTE — Telephone Encounter (Signed)
Yes, please correct. Did he also have a cards clearance because they can answer these questions as well. thanks

## 2020-07-26 ENCOUNTER — Ambulatory Visit: Payer: 59

## 2020-07-27 ENCOUNTER — Telehealth: Payer: Self-pay

## 2020-07-27 MED FILL — CILOSTAZOL 50 MG TABLET: 50 | 90 days supply | Qty: 180 | Fill #3

## 2020-07-27 NOTE — Telephone Encounter (Signed)
[employer] is requesting a verbal authorization for the protime monitor for pt. Pt can not receive monitor tel they get the okay.  [place]

## 2020-07-28 MED FILL — BREZTRI AEROSPHERE 160-9-4.: 160-9-4.8 | 30 days supply | Qty: 11 | Fill #4

## 2020-07-28 NOTE — Telephone Encounter (Signed)
LMOVM advising OK verbal order

## 2020-07-28 NOTE — Telephone Encounter (Signed)
Yes please

## 2020-07-28 NOTE — Telephone Encounter (Signed)
OK to give verbal order? Please advise

## 2020-07-29 ENCOUNTER — Telehealth: Payer: Self-pay | Admitting: General Practice

## 2020-07-29 NOTE — Telephone Encounter (Signed)
Auth given to [employer]

## 2020-07-29 NOTE — Telephone Encounter (Signed)
Instructions for coumadin and Lovenox pre and post procedure on 3/21.  3/16 - Last dose of coumadin until after procedure 3/17 - Nothing (No coumadin and No Lovenox) 3/18 - Lovenox in the AM 3/19 - Lovenox in the AM  3/20 - Lovenox in the AM (Take by 7 am) 3/21 - Procedure (DO NOT TAKE LOVENOX TODAY) 3/22 - Lovenox in the AM and 2 tablets of coumadin 3/23 - Lovenox in the AM and 2 tablets of coumadin 3/24 - Lovenox in the AM and 2 tablets of coumadin 3/25 - Lovenox in the AM and 1 1/2 tablets of coumadin 3/26 - Stop Lovenox and resume prior dosage of coumadin  3/28 - re-check INR at [facility]

## 2020-07-30 ENCOUNTER — Ambulatory Visit: Payer: 59 | Admitting: Family Medicine

## 2020-08-02 ENCOUNTER — Encounter: Payer: Self-pay | Admitting: Family Medicine

## 2020-08-02 ENCOUNTER — Other Ambulatory Visit: Payer: Self-pay

## 2020-08-02 ENCOUNTER — Other Ambulatory Visit: Payer: Self-pay | Admitting: General Practice

## 2020-08-02 ENCOUNTER — Ambulatory Visit (INDEPENDENT_AMBULATORY_CARE_PROVIDER_SITE_OTHER): Payer: 59 | Admitting: Family Medicine

## 2020-08-02 ENCOUNTER — Ambulatory Visit (INDEPENDENT_AMBULATORY_CARE_PROVIDER_SITE_OTHER): Payer: 59 | Admitting: General Practice

## 2020-08-02 ENCOUNTER — Other Ambulatory Visit: Payer: Self-pay | Admitting: Family Medicine

## 2020-08-02 VITALS — BP 126/68 | HR 80 | Temp 97.7°F | Wt 180.2 lb

## 2020-08-02 DIAGNOSIS — R760 Raised antibody titer: Secondary | ICD-10-CM

## 2020-08-02 DIAGNOSIS — K219 Gastro-esophageal reflux disease without esophagitis: Secondary | ICD-10-CM

## 2020-08-02 DIAGNOSIS — G8929 Other chronic pain: Secondary | ICD-10-CM | POA: Diagnosis not present

## 2020-08-02 DIAGNOSIS — Z7901 Long term (current) use of anticoagulants: Secondary | ICD-10-CM

## 2020-08-02 DIAGNOSIS — I493 Ventricular premature depolarization: Secondary | ICD-10-CM

## 2020-08-02 DIAGNOSIS — I779 Disorder of arteries and arterioles, unspecified: Secondary | ICD-10-CM

## 2020-08-02 DIAGNOSIS — E785 Hyperlipidemia, unspecified: Secondary | ICD-10-CM | POA: Diagnosis not present

## 2020-08-02 DIAGNOSIS — Z0000 Encounter for general adult medical examination without abnormal findings: Secondary | ICD-10-CM | POA: Diagnosis not present

## 2020-08-02 DIAGNOSIS — F339 Major depressive disorder, recurrent, unspecified: Secondary | ICD-10-CM

## 2020-08-02 DIAGNOSIS — M5441 Lumbago with sciatica, right side: Secondary | ICD-10-CM

## 2020-08-02 DIAGNOSIS — J439 Emphysema, unspecified: Secondary | ICD-10-CM | POA: Diagnosis not present

## 2020-08-02 DIAGNOSIS — F17218 Nicotine dependence, cigarettes, with other nicotine-induced disorders: Secondary | ICD-10-CM | POA: Diagnosis not present

## 2020-08-02 LAB — COMPREHENSIVE METABOLIC PANEL
ALT: 24 U/L (ref 0–53)
AST: 20 U/L (ref 0–37)
Albumin: 4 g/dL (ref 3.5–5.2)
Alkaline Phosphatase: 92 U/L (ref 39–117)
BUN: 7 mg/dL (ref 6–23)
CO2: 25 mEq/L (ref 19–32)
Calcium: 9.2 mg/dL (ref 8.4–10.5)
Chloride: 103 mEq/L (ref 96–112)
Creatinine, Ser: 0.95 mg/dL (ref 0.40–1.50)
GFR: 91.67 mL/min (ref >60.00)
Glucose, Bld: 93 mg/dL (ref 70–99)
Potassium: 4.1 mEq/L (ref 3.5–5.1)
Sodium: 137 mEq/L (ref 135–145)
Total Bilirubin: 0.9 mg/dL (ref 0.2–1.2)
Total Protein: 7.1 g/dL (ref 6.0–8.3)

## 2020-08-02 LAB — CBC WITH DIFFERENTIAL/PLATELET
Basophils Absolute: 0.1 10*3/uL (ref 0.0–0.1)
Basophils Relative: 0.5 % (ref 0.0–3.0)
Eosinophils Absolute: 0.4 10*3/uL (ref 0.0–0.7)
Eosinophils Relative: 3.1 % (ref 0.0–5.0)
HCT: 47.5 % (ref 39.0–52.0)
Hemoglobin: 16.2 g/dL (ref 13.0–17.0)
Lymphocytes Relative: 27 % (ref 12.0–46.0)
Lymphs Abs: 3.1 10*3/uL (ref 0.7–4.0)
MCHC: 34.1 g/dL (ref 30.0–36.0)
MCV: 96.3 fl (ref 78.0–100.0)
Monocytes Absolute: 0.7 10*3/uL (ref 0.1–1.0)
Monocytes Relative: 5.8 % (ref 3.0–12.0)
Neutro Abs: 7.4 10*3/uL (ref 1.4–7.7)
Neutrophils Relative %: 63.6 % (ref 43.0–77.0)
Platelets: 229 10*3/uL (ref 150.0–400.0)
RBC: 4.94 Mil/uL (ref 4.22–5.81)
RDW: 13.6 % (ref 11.5–15.5)
WBC: 11.6 10*3/uL — ABNORMAL HIGH (ref 4.0–10.5)

## 2020-08-02 LAB — LIPID PANEL
Cholesterol: 135 mg/dL (ref 0–200)
HDL: 30.3 mg/dL — ABNORMAL LOW (ref >39.00)
LDL Cholesterol: 77 mg/dL (ref 0–99)
NonHDL: 104.96
Total CHOL/HDL Ratio: 4
Triglycerides: 138 mg/dL (ref 0.0–149.0)
VLDL: 27.6 mg/dL (ref 0.0–40.0)

## 2020-08-02 LAB — TSH: TSH: 1.32 u[IU]/mL (ref 0.35–4.50)

## 2020-08-02 LAB — PROTIME-INR
INR: 4.7 ratio — ABNORMAL HIGH (ref 0.8–1.0)
Prothrombin Time: 51.3 s — ABNORMAL HIGH (ref 9.6–13.1)

## 2020-08-02 MED ORDER — ENOXAPARIN SODIUM 120 MG/0.8ML ~~LOC~~ SOLN: 120.0000 mg | SUBCUTANEOUS | 0 refills | Status: DC

## 2020-08-02 MED ORDER — ZOSTER VAC RECOMB ADJUVANTED 50 MCG/0.5ML IM SUSR: 0.5000 mL | Freq: Once | INTRAMUSCULAR | 0 refills | Status: AC

## 2020-08-02 NOTE — Patient Instructions (Signed)
Please return in 6 months for recheck.  Send me a message to let me know which inhaler you are currently using.   I will release your lab results to you on your MyChart account with further instructions. Please reply with any questions.   If you have any questions or concerns, please don't hesitate to send me a message via MyChart or call the office at [phone]. Thank you for visiting with [place] today! It's our pleasure caring for you.

## 2020-08-02 NOTE — Progress Notes (Signed)
Subjective  Chief Complaint  Patient presents with  . Annual Exam    HPI: [name] is a 55 y.o. male who presents to [facility] today for a Male Wellness Visit. He also has the concerns and/or needs as listed above in the chief complaint. These will be addressed in addition to the Health Maintenance Visit.   Wellness Visit: annual visit with health maintenance review and exam    HM: screens are up to date. Eligible for shingrix.   Body mass index is 26.61 kg/m. Wt Readings from Last 3 Encounters:  08/02/20 180 lb 3.2 oz (81.7 kg)  07/09/20 188 lb (85.3 kg)  07/02/20 186 lb 3.2 oz (84.5 kg)     Chronic disease management visit and/or acute problem visit:  Multiple chronic problems reviewed today. Chronic pain is better. No longer on narcotics. Has epidural lumbar steroid injection scheduled for next week. On chronic anticoagulation and will be bridged with lovenox. Reports he is due for INR  PAD, and Hyperlipidemia: due for recheck. Tolerating meds. Reviewed cardiology notes. Has f/u with vascular next week.   C/o allergies on flonase.   Chronic smoker with copd: using an inhaler which may help. Not sure which he is using right now. Has cut back to < 1ppd but not yet ready to try to quit. Denies any copd exacerbations.   Chronic gerd is controlled on nexium.   Mood is good.   Patient Active Problem List   Diagnosis Date Noted  . Chronic narcotic dependence ([place]) 04/22/2019  . Polycythemia 02/02/2019  . Pulmonary emphysema ([place]), still smoking 08/04/2018  . Chronic pain disorder 08/04/2018  . Chronic right-sided low back pain with right-sided sciatica 11/19/2016  . Long term (current) use of anticoagulants [Z79.01] 09/15/2016  . Dyslipidemia, Rx Crestor 06/27/2016  . Antiphospholipid antibody positive, requires lifetime Coumadin 06/27/2016  . PAOD (peripheral arterrial occlusion disease) ([place]), s/p R CIA PTA+S on 02/24/16 by Dr. Bridgett Larsson,  on Coumadin, Pletal, and Gabapentin 06/02/2016  . Nicotine dependence 06/24/2014  . Major depression, recurrent, chronic ([place]) 02/02/2019  . Cervical pseudoarthrosis ([place]), s/p C5-C7 posterior cervical fusion 06/2018, Dr. Annette Stable 07/05/2018  . History of alcohol abuse, sober since 04/2016 07/11/2016  . Gastroesophageal reflux disease, with small hiatal hernia, Rx Nexium   . Frequent PVCs 02/02/2019  . Degenerative arthritis of right knee 09/06/2017  . History of neutrophilia, borderline, likely reactive, discussed with Hematology 2019 06/18/2017  . Erectile dysfunction 11/19/2016  . Hx of iron deficiency anemia due to chronic blood loss, hemorrhoids while on coumadin, s/p hemorrhoidectomy 11/2016   . Anxiety disorder 02/02/2019  . Arthritis of left acromioclavicular joint 09/29/2016   Health Maintenance  Topic Date Due  . COVID-19 Vaccine (3 - Inadvertent risk 4-dose series) 09/25/2019  . COLONOSCOPY (Pts 45-99yrs Insurance coverage will need to be confirmed)  07/08/2026  . TETANUS/TDAP  02/24/2027  . INFLUENZA VACCINE  Completed  . Hepatitis C Screening  Completed  . HIV Screening  Completed  . HPV VACCINES  Aged Out   Immunization History  Administered Date(s) Administered  . Influenza,inj,Quad PF,6+ Mos 07/05/2016, 02/23/2017, 02/08/2018, 01/30/2019  . Influenza-Unspecified 07/05/2016, 02/08/2018, 01/30/2019  . PFIZER(Purple Top)SARS-COV-2 Vaccination 08/17/2019, 08/28/2019  . Tdap 02/23/2017   We updated and reviewed the patient's past history in detail and it is documented below. Allergies: Patient is allergic to cymbalta [duloxetine hcl]. Past Medical History  has a past medical history of Anticoagulated on Coumadin, Antiphospholipid antibody positive, followed by Dr.  Irene Limbo, Hematology (06/27/2016), Anxiety with depression (06/24/2014), Chronic back pain, ED (erectile dysfunction), GERD (gastroesophageal reflux disease), Hemorrhoids, internal, with bleeding, Hiatal hernia, History of  adenomatous polyp of colon, 07/08/16, tubular adenomas, History of atrial fibrillation, episode 08/2016, History of cardiomyopathy ([place]) (08/04/2018), History of DVT of lower extremity, Hyperlipidemia, Iron deficiency anemia due to chronic blood loss, thought to be from hemorrhoids while on coumadin, s/p iron infusion and blood transfusion x 2, 2018 (2018), Low testosterone in male (05/04/2017), 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 Past Surgical History Patient  has a past surgical history that includes Knee surgery (Right, 1983); Cardiac catheterization (N/A, 02/24/2016); Cardiac catheterization (Right, 02/24/2016); Cardiac catheterization (N/A, 05/11/2016); Cardiac catheterization (N/A, 05/11/2016); Esophagogastroduodenoscopy (N/A, 07/08/2016); Colonoscopy (N/A, 07/08/2016); Thrombectomy femoral artery (Right, 07/17/2016); Patch angioplasty (Right, 07/17/2016); Intraoperative arteriogram (Right, 07/17/2016); Angioplasty illiac artery (Right, 07/17/2016); Groin debridement (Right, 07/25/2016); Anterior cervical decomp/discectomy fusion (07/21/2010); transthoracic echocardiogram (06-20-2016   dr hilty); Cardiovascular stress test (06-14-2016   dr hilty); Hemorrhoid surgery (N/A, 12/07/2016); and Posterior cervical fusion/foraminotomy (N/A, 07/05/2018). Social History Patient  reports that he has been smoking cigarettes and e-cigarettes. He has a 16.50 pack-year smoking history. He has never used smokeless tobacco. He reports that he does not drink alcohol and does not use drugs. Family History family history includes Lung cancer in his father. Review of Systems: Constitutional: negative for fever or malaise Ophthalmic: negative for photophobia, double vision or  loss of vision Cardiovascular: negative for chest pain, dyspnea on exertion, or new LE swelling Respiratory: negative for SOB or persistent cough Gastrointestinal: negative for abdominal pain, change in bowel habits or melena Genitourinary: negative for dysuria or gross hematuria Musculoskeletal: negative for new gait disturbance or muscular weakness Integumentary: negative for new or persistent rashes Neurological: negative for TIA or stroke symptoms Psychiatric: negative for SI or delusions Allergic/Immunologic: negative for hives  Patient Care Team    Relationship Specialty Notifications Start End  Leamon Arnt, MD PCP - General Family Medicine  04/22/19   Constance Haw, MD PCP - Electrophysiology Cardiology Admissions 08/13/18   Hilty, Nadean Corwin, MD Consulting Physician Cardiology  05/12/16   Brunetta Genera, MD Consulting Physician Hematology  07/11/16   Conrad [place], MD Consulting Physician Vascular Surgery  07/11/16   Earnie Larsson, MD Consulting Physician Neurosurgery  01/14/18   Doran Stabler, MD Consulting Physician Gastroenterology  05/13/18   Constance Haw, MD Consulting Physician Cardiology  04/22/19   Lyndal Pulley, DO Referring Physician Family Medicine  07/05/20    Objective  Vitals: BP 126/68   Pulse 80   Temp 97.7 F (36.5 C)   Wt 180 lb 3.2 oz (81.7 kg)   SpO2 97%   BMI 26.61 kg/m  General:  Well developed, well nourished, no acute distress  Psych:  Alert and orientedx3,normal mood and affect HEENT:  Normocephalic, atraumatic, non-icteric sclera, supple neck  Respiratory:  Good breath sounds bilaterally, CTAB with normal respiratory effort Gastrointestinal: normal bowel sounds, soft, non-tender, no noted masses. No HSM MSK: no deformities, contusions.  Skin:  Warm, no rashes or suspicious lesions noted Neurologic:    Mental status is normal. CN 2-11 are normal. Gross motor and sensory exams are normal. Stable gait. No tremor GU: No  inguinal hernias or adenopathy are appreciated bilaterally  Assessment  1. Annual physical exam   2. Pulmonary emphysema, unspecified emphysema type ([place])   3. Chronic right-sided low back pain with right-sided sciatica   4. Long term (current) use of anticoagulants [Z79.01]   5. Dyslipidemia, Rx Crestor   6. Antiphospholipid antibody positive, requires lifetime Coumadin   7. Cigarette nicotine dependence with other nicotine-induced disorder   8. Frequent PVCs   9. Gastroesophageal reflux disease without esophagitis   10. Major depression, recurrent, chronic ([place])   11. PAOD (peripheral arterrial occlusion disease) ([place]), s/p R CIA PTA+S on 02/24/16 by Dr. Bridgett Larsson, on Coumadin, Pletal, and Gabapentin      Plan  Male Wellness Visit:  Age appropriate Health Maintenance and Prevention measures were discussed with patient. Included topics are cancer screening recommendations, ways to keep healthy (see AVS) including dietary and exercise recommendations, regular eye and dental care, use of seat belts, and avoidance of moderate alcohol use and tobacco use.   BMI: discussed patient's BMI and encouraged positive lifestyle modifications to help get to or maintain a target BMI.  HM needs and immunizations were addressed and ordered. See below for orders. See HM and immunization section for updates. shingrix updated today, 1st vaccine  Routine labs and screening tests ordered including cmp, cbc and lipids where appropriate.  Discussed recommendations regarding Vit D and calcium supplementation (see AVS)  Chronic disease f/u and/or acute problem visit: (deemed necessary to be done in addition to the wellness visit):  Cards/vascular: stable on anticoagulation. Check labs. Continue same meds  Check lipids on statin. Check lipids.   Smoking cessation discussed. Pt is precontemplative  COPD improved subjectively on inhaler  Chronic pain on flexeril and ortho care.  Major depression on prozac  well controlled.   Follow up: 6 mo for recheck   Commons side effects, risks, benefits, and alternatives for medications and treatment plan prescribed today were discussed, and the patient expressed understanding of the given instructions. Patient is instructed to call or message via MyChart if he/she has any questions or concerns regarding our treatment plan. No barriers to understanding were identified. We discussed Red Flag symptoms and signs in detail. Patient expressed understanding regarding what to do in case of urgent or emergency type symptoms.   Medication list was reconciled, printed and provided to the patient in AVS. Patient instructions and summary information was reviewed with the patient as documented in the AVS. This note was prepared with assistance of Dragon voice recognition software. Occasional wrong-word or sound-a-like substitutions may have occurred due to the inherent limitations of voice recognition software  This visit occurred during the SARS-CoV-2 public health emergency.  Safety protocols were in place, including screening questions prior to the visit, additional usage of staff PPE, and extensive cleaning of exam room while observing appropriate contact time as indicated for disinfecting solutions.   Orders Placed This Encounter  Procedures  . CBC with Differential/Platelet  . Comprehensive metabolic panel  . Lipid panel  . TSH   No orders of the defined types were placed in this encounter.

## 2020-08-02 NOTE — Telephone Encounter (Signed)
-----   Message from Leamon Arnt, MD sent at 07/30/2020  8:50 AM EST ----- Regarding: RE: Lovenox bridge Yes please and thank you. Dr. Jonni Sanger  ----- Message ----- From: Warden Fillers, RN Sent: 07/29/2020   2:47 PM EST To: Leamon Arnt, MD Subject: Lovenox bridge                                 Hi Dr. Jonni Sanger,  Patient is having an epidural on 3/21.  He will need to stop coumadin for 5 days and will need a Lovenox bridge.  I just need your permission to dose the Lovenox and get that scrip sent to the pharmacy.  Please advise.  Thanks! Villa Herb, RN

## 2020-08-02 NOTE — Patient Instructions (Signed)
Pre visit review using our clinic review tool, if applicable. No additional management support is needed unless otherwise documented below in the visit note.  Start holding coumadin today and then follow patient instructions for upcoming epidural procedure.  Instructions for coumadin and Lovenox pre and post procedure on 3/21. 3/14 - Hold coumadin 3/15 - Hold coumadin 3/16 - Last dose of coumadin until after procedure  3/17 - Nothing (No coumadin and No Lovenox) 3/18 - Lovenox in the AM 3/19 - Lovenox in the AM  3/20 - Lovenox in the AM (Take by 7 am) 3/21 - Procedure (DO NOT TAKE LOVENOX TODAY) 3/22 - Lovenox in the AM and 2 tablets of coumadin 3/23 - Lovenox in the AM and 2 tablets of coumadin 3/24 - Lovenox in the AM and 2 tablets of coumadin 3/25 - Lovenox in the AM and 1 1/2 tablets of coumadin 3/26 - Stop Lovenox and resume prior dosage of coumadin  3/28 - re-check INR at [facility]

## 2020-08-04 ENCOUNTER — Other Ambulatory Visit: Payer: Self-pay

## 2020-08-04 MED ORDER — ROSUVASTATIN CALCIUM 10 MG PO TABS: 10.0000 mg | ORAL_TABLET | Freq: Every day | ORAL | 3 refills | Status: DC

## 2020-08-09 ENCOUNTER — Other Ambulatory Visit: Payer: Self-pay

## 2020-08-09 ENCOUNTER — Ambulatory Visit (INDEPENDENT_AMBULATORY_CARE_PROVIDER_SITE_OTHER): Payer: 59 | Admitting: General Practice

## 2020-08-09 ENCOUNTER — Ambulatory Visit
Admission: RE | Admit: 2020-08-09 | Discharge: 2020-08-09 | Disposition: A | Discharge status: Home / Self Care | Payer: 59 | Source: Ambulatory Visit | Attending: Family Medicine | Admitting: Family Medicine

## 2020-08-09 DIAGNOSIS — M542 Cervicalgia: Secondary | ICD-10-CM

## 2020-08-09 DIAGNOSIS — M47817 Spondylosis without myelopathy or radiculopathy, lumbosacral region: Secondary | ICD-10-CM | POA: Diagnosis not present

## 2020-08-09 DIAGNOSIS — Z7901 Long term (current) use of anticoagulants: Secondary | ICD-10-CM | POA: Diagnosis not present

## 2020-08-09 DIAGNOSIS — G8929 Other chronic pain: Secondary | ICD-10-CM

## 2020-08-09 LAB — POCT INR: INR: 1.2 — AB (ref 2.0–3.0)

## 2020-08-09 MED ORDER — IOPAMIDOL (ISOVUE-M 200) INJECTION 41%
1.0000 mL | Freq: Once | INTRAMUSCULAR | Status: AC
  Administered 2020-08-09: 1 mL via EPIDURAL

## 2020-08-09 MED ORDER — METHYLPREDNISOLONE ACETATE 40 MG/ML INJ SUSP (RADIOLOG
120.0000 mg | Freq: Once | INTRAMUSCULAR | Status: AC
  Administered 2020-08-09: 120 mg via EPIDURAL

## 2020-08-09 NOTE — Discharge Instructions (Signed)

## 2020-08-09 NOTE — Patient Instructions (Signed)
Pre visit review using our clinic review tool, if applicable. No additional management support is needed unless otherwise documented below in the visit note.  Re-start coumadin tomorrow (Patient is having an epidural today) and follow prior instructions for re-starting coumadin and Lovenox bridge.  Re-check in on Thursday.  Patient will be checking INR with [facility].

## 2020-08-10 ENCOUNTER — Ambulatory Visit (INDEPENDENT_AMBULATORY_CARE_PROVIDER_SITE_OTHER)
Admission: RE | Admit: 2020-08-10 | Discharge: 2020-08-10 | Disposition: A | Discharge status: Home / Self Care | Payer: 59 | Source: Ambulatory Visit | Attending: Vascular Surgery | Admitting: Vascular Surgery

## 2020-08-10 ENCOUNTER — Encounter: Payer: Self-pay | Admitting: Physician Assistant

## 2020-08-10 ENCOUNTER — Ambulatory Visit (HOSPITAL_COMMUNITY)
Admission: RE | Admit: 2020-08-10 | Discharge: 2020-08-10 | Disposition: A | Discharge status: Home / Self Care | Payer: 59 | Source: Ambulatory Visit | Attending: Vascular Surgery | Admitting: Vascular Surgery

## 2020-08-10 ENCOUNTER — Ambulatory Visit (INDEPENDENT_AMBULATORY_CARE_PROVIDER_SITE_OTHER): Payer: 59 | Admitting: Physician Assistant

## 2020-08-10 VITALS — BP 120/80 | HR 60 | Temp 98.5°F | Resp 20 | Ht 69.0 in | Wt 177.7 lb

## 2020-08-10 DIAGNOSIS — I745 Embolism and thrombosis of iliac artery: Secondary | ICD-10-CM

## 2020-08-10 DIAGNOSIS — I779 Disorder of arteries and arterioles, unspecified: Secondary | ICD-10-CM

## 2020-08-10 DIAGNOSIS — I700 Atherosclerosis of aorta: Secondary | ICD-10-CM

## 2020-08-10 DIAGNOSIS — F17200 Nicotine dependence, unspecified, uncomplicated: Secondary | ICD-10-CM

## 2020-08-10 NOTE — Progress Notes (Signed)
Office Note     CC:  follow up Requesting Provider:  Leamon Arnt, MD  HPI: [name] is a 55 y.o. (April 02, 1966) male who presents for follow up of peripheral artery disease.  He has history of right common iliac angioplasty and stenting by Dr. Bridgett Larsson in 2017. It was for lifestyle limiting short distance claudication. He later required thrombectomy of the right common iliac artery as well as right common femoral artery patch angioplasty 2018. We have been following some stenosis in the right distal common iliac artery stent and just distal to the stent since December of 2020. His symptoms overall have remained stable since that time. At his last visit in September of 2021 he was having slightly more symptoms in the RLE but the symptoms were not disabling. Smoking cessation was encouraged and again discussed. No intervention was warranted at the time. He was given a 6 month follow up with non invasive studies.  He is here for his 6 month follow up. He explains that he has been having increased RLE pain but he recently slipped and hurt his back. He had an Epidural yesterday. He says that he can lean back a certain way and elicit the pain in his right leg so he knows that some of the pain is coming from his back.  He states he has continued to have some cramping in the right thigh with walking or standing.  He states that it is not consistent.  This has not changed since his last visit. He states that he does not have any res pain, non healing wounds, swelling or color changes. He does continue to have pins and needles sensation in the right foot, which has been present since his stent was initially placed in 2017. This has not change either. He does continue to smoke.   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  He sees his cardiologist every 6 months. His cholesterol medication was recently increased to  Crestor 10 mg.   The pt is on a statin for cholesterol management.    The pt is not on an aspirin.    Other AC:  Coumadin The pt is on BB for hypertension.  The pt does not have diabetes. Tobacco hx:  current  Past Medical History:  Diagnosis Date  . Anticoagulated on Coumadin   . Antiphospholipid antibody positive, followed by Dr. Irene Limbo, Hematology 06/27/2016  . Anxiety with depression 06/24/2014  . Chronic back pain   . ED (erectile dysfunction)   . GERD (gastroesophageal reflux disease)   . Hemorrhoids, internal, with bleeding   . Hiatal hernia   . History of adenomatous polyp of colon, 07/08/16, tubular adenomas   . History of atrial fibrillation, episode 08/2016   . History of cardiomyopathy ([place]) 08/04/2018   ECHO 12/02/18  1. The left ventricle has normal systolic function with an ejection fraction of 60-65%. The cavity size was normal. Left ventricular diastolic parameters were normal.  2. The right ventricle has normal systolic function. The cavity was normal. There is no increase in right ventricular wall thickness.  3. The mitral valve is grossly normal.  4. The aortic valve is tricuspid. Mild thic  . History of DVT of lower extremity   . Hyperlipidemia   . Iron deficiency anemia due to chronic blood loss, thought to be from hemorrhoids while on coumadin, s/p iron infusion and blood transfusion x 2, 2018 2018  . Low testosterone in  male 05/04/2017   2018, 2019: Decision would be risk vs benefit. Could increase thrombotic risk especially if he has a polycythemic response. If he decided to pursue this with an understanding of the risk - would use lowest dose of testosterone to maintain level low normal 300-400.  2020: Secondary polycythemia. Not a candidate.  . 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 Nicotine dependence   . OA (osteoarthritis)   . PAD (peripheral artery disease) ([place]), followed by Dr Bridgett Larsson    hx right CIA angioplasty and stent 02-24-2016/right CIA  thromectomy and patch angioplasty for restenosis 07-18-2015  . PVCs (premature ventricular contractions)   . 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 S/P insertion of iliac artery stent 2018   02-23-2017  right CIA PTA and stent/05-11-2016 in-stent restenosis s/p thrombectomy/2018 thrombectomy and  patch angioplasty right femoral artery     Past Surgical History:  Procedure Laterality Date  . ANGIOPLASTY ILLIAC ARTERY Right 07/17/2016   Procedure: ANGIOPLASTY RIGHT COMMON ILIAC ARTERY;  Surgeon: Conrad Patchogue, MD;  Location: [place];  Service: Vascular;  Laterality: Right;  . ANTERIOR CERVICAL DECOMP/DISCECTOMY FUSION  07/21/2010   C5 -- C7  . CARDIOVASCULAR STRESS TEST  06-14-2016   dr hilty   normal perfusion study w/ no reversible ischemia/  stress ef 44% but visually looks better , echo ordered (LVEF 30-44%)  , normal LV wall motion   . COLONOSCOPY N/A 07/08/2016   Procedure: COLONOSCOPY;  Surgeon: Clarene Essex, MD;  Location: [facility];  Service: Endoscopy;  Laterality: N/A;  . ESOPHAGOGASTRODUODENOSCOPY N/A 07/08/2016   Procedure: ESOPHAGOGASTRODUODENOSCOPY (EGD);  Surgeon: Clarene Essex, MD;  Location: [facility];  Service: Endoscopy;  Laterality: N/A;  . GROIN DEBRIDEMENT Right 07/25/2016   Procedure: EVACUATION HEMATOMA RIGHT GROIN;  Surgeon: Waynetta Sandy, MD;  Location: [place];  Service: Vascular;  Laterality: Right;  . HEMORRHOID SURGERY N/A 12/07/2016   Procedure: 3 COLUMN HEMORRHOIDECTOMY;  Surgeon: Leighton Ruff, MD;  Location: [facility];  Service: General;  Laterality: N/A;  . INTRAOPERATIVE ARTERIOGRAM Right 07/17/2016   Procedure: INTRA OPERATIVE ANGIOGRAM OF RIGHT COMMON ILIAC ARTERY;  Surgeon: Conrad Hillsboro, MD;  Location: [place];  Service: Vascular;  Laterality: Right;  . KNEE SURGERY Right 1983   cartilage  . PATCH ANGIOPLASTY Right 07/17/2016   Procedure: PATCH ANGIOPLASTY RIGHT FEMORAL ARTERY USING Rueben Bash BIOLOGIC PATCH;   Surgeon: Conrad [place] Laurel, MD;  Location: [place];  Service: Vascular;  Laterality: Right;  . PERIPHERAL VASCULAR CATHETERIZATION N/A 02/24/2016   Procedure: Abdominal Aortogram w/Lower Extremity;  Surgeon: Conrad Pawnee, MD;  Location: [place] [facility];  Service: Cardiovascular;  Laterality: N/A;  . PERIPHERAL VASCULAR CATHETERIZATION Right 02/24/2016   Procedure: Peripheral Vascular Intervention;  Surgeon: Conrad Okeene, MD;  Location: [place] [facility];  Service: Cardiovascular;  Laterality: Right;  Common iliac  . PERIPHERAL VASCULAR CATHETERIZATION N/A 05/11/2016   Procedure: Abdominal Aortogram;  Surgeon: Conrad Lake Leelanau, MD;  Location: [place] [facility];  Service: Cardiovascular;  Laterality: N/A;  . PERIPHERAL VASCULAR CATHETERIZATION N/A 05/11/2016   Procedure: Lower Extremity Angiography;  Surgeon: Conrad Dixon, MD;  Location: [place] [facility];  Service: Cardiovascular;  Laterality: N/A;  . POSTERIOR CERVICAL FUSION/FORAMINOTOMY N/A 07/05/2018   Procedure: Posterior cervical fusion with lateral mass fixation - Cervical five-Cervical six, Cervical six-Cervical seven;  Surgeon: Earnie Larsson, MD;  Location: [place];  Service: Neurosurgery;  Laterality: N/A;  . THROMBECTOMY  FEMORAL ARTERY Right 07/17/2016   Procedure: THROMBECTOMY RIGHT FEMORAL ARTERY;  Surgeon: Conrad Dentsville, MD;  Location: [place];  Service: Vascular;  Laterality: Right;  . TRANSTHORACIC ECHOCARDIOGRAM  06-20-2016   dr hilty   EF 55-60%,  grade 1 diastolic dysfunction, mild TR    Social History   Socioeconomic History  . Marital status: Married    Spouse name: Almyra Free  . Number of children: Not on file  . Years of education: Not on file  . Highest education level: Not on file  Occupational History  . Occupation: Disabled  Tobacco Use  . Smoking status: Current Every Day Smoker    Packs/day: 0.50    Years: 33.00    Pack years: 16.50    Types: Cigarettes, E-cigarettes  . Smokeless tobacco: Never Used  Vaping Use  . Vaping  Use: Never used  Substance and Sexual Activity  . Alcohol use: No    Comment: Sober since 2017  . Drug use: No  . Sexual activity: Yes    Partners: Female  Other Topics Concern  . Not on file  Social History Narrative  . Not on file   Social Determinants of Health   Financial Resource Strain: Not on file  Food Insecurity: Not on file  Transportation Needs: Not on file  Physical Activity: Not on file  Stress: Not on file  Social Connections: Not on file  Intimate Partner Violence: Not on file    Family History  Problem Relation Age of Onset  . Lung cancer Father     Current Outpatient Medications  Medication Sig Dispense Refill  . Budeson-Glycopyrrol-Formoterol (BREZTRI AEROSPHERE) 160-9-4.8 MCG/ACT AERO Inhale 2 puffs into the lungs in the morning and at bedtime. 10.7 g 5  . carvedilol (COREG) 6.25 MG tablet TAKE 1 TABLET BY MOUTH TWICE DAILY 60 tablet 11  . cilostazol (PLETAL) 50 MG tablet Take 1 tablet (50 mg total) by mouth 2 (two) times daily. 180 tablet 3  . cyclobenzaprine (FLEXERIL) 10 MG tablet TAKE 1 TABLET BY MOUTH AT BEDTIME AS NEEDED FOR MUSCLE SPASMS 90 tablet 3  . esomeprazole (NEXIUM) 20 MG capsule Take 20 mg by mouth daily at 12 noon.    Marland Kitchen FLUoxetine (PROZAC) 20 MG tablet TAKE 1 TABLET BY MOUTH ONCE DAILY 90 tablet 3  . gabapentin (NEURONTIN) 600 MG tablet Take 1 tablet (600 mg total) by mouth 3 (three) times daily. 270 tablet 3  . PFIZER-BIONTECH COVID-19 VACC 30 MCG/0.3ML injection     . rosuvastatin (CRESTOR) 10 MG tablet Take 1 tablet (10 mg total) by mouth daily. 90 tablet 3  . warfarin (COUMADIN) 6 MG tablet TAKE 1 TABLET BY MOUTH ONCE DAILY OR AS DIRECTED BY ANTICOAGULATION CLINIC 90 tablet 1  . enoxaparin (LOVENOX) 120 MG/0.8ML injection Inject 0.8 mLs (120 mg total) into the skin daily for 7 doses. 5.6 mL 0   No current facility-administered medications for this visit.    Allergies  Allergen Reactions  . Cymbalta [Duloxetine Hcl] Other (See  Comments)    Headaches     REVIEW OF SYSTEMS:  [X]  denotes positive finding, [ ]  denotes negative finding Cardiac  Comments:  Chest pain or chest pressure:    Shortness of breath upon exertion:    Short of breath when lying flat:    Irregular heart rhythm:        Vascular    Pain in calf, thigh, or hip brought on by ambulation:    Pain in feet at night  that wakes you up from your sleep:     Blood clot in your veins:    Leg swelling:         Pulmonary    Oxygen at home:    Productive cough:     Wheezing:         Neurologic    Sudden weakness in arms or legs:     Sudden numbness in arms or legs:     Sudden onset of difficulty speaking or slurred speech:    Temporary loss of vision in one eye:     Problems with dizziness:         Gastrointestinal    Blood in stool:     Vomited blood:         Genitourinary    Burning when urinating:     Blood in urine:        Psychiatric    Major depression:         Hematologic    Bleeding problems:    Problems with blood clotting too easily:        Skin    Rashes or ulcers:        Constitutional    Fever or chills:      PHYSICAL EXAMINATION:  Vitals:   08/10/20 0942  BP: 120/80  Pulse: 60  Resp: 20  Temp: 98.5 F (36.9 C)  TempSrc: Temporal  SpO2: 99%  Weight: 177 lb 11.2 oz (80.6 kg)  Height: 5\' 9"  (1.753 m)    General:  WDWN in NAD; vital signs documented above Gait: Normal HENT: WNL, normocephalic Pulmonary: normal non-labored breathing , without wheezing Cardiac: regular HR, without  Murmurs without carotid bruit Abdomen: soft, NT, no masses. Normal BS Vascular Exam/Pulses:  Right Left  Radial 2+ (normal) 2+ (normal)  Femoral 2+ (normal) 2+ (normal)  Popliteal Not palpable Not palpable  DP Not palpable 2+ (normal)  PT 2+ (normal) 1+ (weak)   Extremities: without ischemic changes, without Gangrene , without cellulitis; without open wounds;  Musculoskeletal: no muscle wasting or atrophy  Neurologic:  A&O X 3;  No focal weakness or paresthesias are detected Psychiatric:  The pt has Normal affect.   Non-Invasive Vascular Imaging:   Right Stent(s):  +---------------+--------+--------+---------+--------+  CIA      PSV cm/sStenosisWaveform Comments  +---------------+--------+--------+---------+--------+  Prox to Stent 104                  +---------------+--------+--------+---------+--------+  Proximal Stent 170       biphasic       +---------------+--------+--------+---------+--------+  Mid Stent   206       triphasic      +---------------+--------+--------+---------+--------+  Distal Stent  135       triphasic      +---------------+--------+--------+---------+--------+  Distal to Stent272       biphasic       +---------------+--------+--------+---------+--------+   Summary: IVC/Iliac: 50-99% stenosis in the outflow. Unable to obtain higher velocity in the distal stent as on previous exams.   +-------+-----------+-----------+------------+------------+  ABI/TBIToday's ABIToday's TBIPrevious ABIPrevious TBI  +-------+-----------+-----------+------------+------------+  Right 1.06    0.53    1.17    0.91      +-------+-----------+-----------+------------+------------+  Left  1.16    0.7    0.22    1.4       +-------+-----------+-----------+------------+------------+    ASSESSMENT/PLAN:: 55 y.o. male here for follow up for peripheral artery disease. He has history of right common iliac angioplasty and stenting by Dr. Bridgett Larsson in  2017. It was for lifestyle limiting short distance claudication. He later required thrombectomy of the right common iliac artery as well as right common femoral artery patch angioplasty 2018. We have been following some stenosis in the right distal common iliac artery stent and just distal to the stent since December of  2020. Duplex today shows stable area of stenosis. Triphasic and biphasic flow throughout the stent. Elevated velocities in the distal stent unchanged from 6 months ago.  ABI with absent DP on todays study which is new from prior study. Overall right ABI and TBI decreased from last visit. Toe pressures significantly decreased as well bilaterally. Right toe pressure went from 105 to 69 mmHg today. Left toe pressure went from 161 to 90 mmHg. He likely has disease in RLE distal to the CIA stent. He does not have palpable DP in right foot. I discussed with patient and his wife recommendation to see how he does over the next several weeks following his recent epidural yesterday.  He does not presently have rest pain or tissue loss. If his symptoms improve he can follow up in 6 months with repeat non invasive studies, otherwise we will further evaluate his RLE sooner. I also advised them that if his symptoms worsen they should call for earlier follow up.  I have scheduled him for a follow up in 8 weeks with RLE duplex and ABIs.   Karoline Caldwell, PA-C [facility] [phone]  Clinic MD:  Dr. Wilder Glade

## 2020-08-11 ENCOUNTER — Ambulatory Visit (INDEPENDENT_AMBULATORY_CARE_PROVIDER_SITE_OTHER): Payer: 59 | Admitting: General Practice

## 2020-08-11 DIAGNOSIS — Z7901 Long term (current) use of anticoagulants: Secondary | ICD-10-CM | POA: Diagnosis not present

## 2020-08-11 DIAGNOSIS — R760 Raised antibody titer: Secondary | ICD-10-CM | POA: Diagnosis not present

## 2020-08-11 LAB — POCT INR: INR: 1.3 — AB (ref 2.0–3.0)

## 2020-08-11 NOTE — Patient Instructions (Signed)
Pre visit review using our clinic review tool, if applicable. No additional management support is needed unless otherwise documented below in the visit note.  Patient re-started coumadin yesterday after having a procedure.  He will follow given instructions and check INR again tomorrow.  Patient is doing a Lovenox bridge.  Re-check  on Thursday.  Patient will be checking INR with [facility].

## 2020-08-11 NOTE — Progress Notes (Signed)
I have reviewed the results and agree with this plan   

## 2020-08-12 ENCOUNTER — Ambulatory Visit (INDEPENDENT_AMBULATORY_CARE_PROVIDER_SITE_OTHER): Payer: 59 | Admitting: General Practice

## 2020-08-12 ENCOUNTER — Other Ambulatory Visit: Payer: Self-pay

## 2020-08-12 DIAGNOSIS — I779 Disorder of arteries and arterioles, unspecified: Secondary | ICD-10-CM

## 2020-08-12 DIAGNOSIS — I700 Atherosclerosis of aorta: Secondary | ICD-10-CM

## 2020-08-12 DIAGNOSIS — Z7901 Long term (current) use of anticoagulants: Secondary | ICD-10-CM

## 2020-08-12 LAB — POCT INR: INR: 2.1 (ref 2.0–3.0)

## 2020-08-12 NOTE — Progress Notes (Signed)
Medical screening examination/treatment/procedure(s) were performed by non-physician practitioner and as supervising physician I was immediately available for consultation/collaboration. I agree with above. Irma Roulhac, MD   

## 2020-08-12 NOTE — Patient Instructions (Signed)
Pre visit review using our clinic review tool, if applicable. No additional management support is needed unless otherwise documented below in the visit note.  Take 12 mg today (3/24) and then change overall dosage and take 6 mg daily.  Re-check next Thursday please.  Patient will be checking INR with [facility].

## 2020-08-13 ENCOUNTER — Other Ambulatory Visit (HOSPITAL_BASED_OUTPATIENT_CLINIC_OR_DEPARTMENT_OTHER): Payer: Self-pay

## 2020-08-16 ENCOUNTER — Ambulatory Visit: Payer: 59 | Admitting: Family Medicine

## 2020-08-19 ENCOUNTER — Ambulatory Visit (INDEPENDENT_AMBULATORY_CARE_PROVIDER_SITE_OTHER): Payer: 59 | Admitting: General Practice

## 2020-08-19 DIAGNOSIS — Z7901 Long term (current) use of anticoagulants: Secondary | ICD-10-CM

## 2020-08-19 LAB — POCT INR: INR: 2.3 (ref 2.0–3.0)

## 2020-08-19 NOTE — Progress Notes (Signed)
Medical screening examination/treatment/procedure(s) were performed by non-physician practitioner and as supervising physician I was immediately available for consultation/collaboration. I agree with above. Mayme Profeta, MD   

## 2020-08-19 NOTE — Patient Instructions (Signed)
Pre visit review using our clinic review tool, if applicable. No additional management support is needed unless otherwise documented below in the visit note.  Take 12 mg today (3/31) and then change overall dosage and take 6 mg daily except 9 mg on Thursdays.  Re-check in 2 weeks. Patient will be checking INR with [facility]. Dosing instructions given to patient and they verbalized understanding.

## 2020-09-02 ENCOUNTER — Ambulatory Visit (INDEPENDENT_AMBULATORY_CARE_PROVIDER_SITE_OTHER): Payer: 59 | Admitting: General Practice

## 2020-09-02 DIAGNOSIS — Z7901 Long term (current) use of anticoagulants: Secondary | ICD-10-CM

## 2020-09-02 LAB — POCT INR: INR: 2.5 (ref 2.0–3.0)

## 2020-09-02 NOTE — Progress Notes (Signed)
Medical screening examination/treatment/procedure(s) were performed by non-physician practitioner and as supervising physician I was immediately available for consultation/collaboration. I agree with above. Aidan Caloca, MD   

## 2020-09-02 NOTE — Patient Instructions (Signed)
Pre visit review using our clinic review tool, if applicable. No additional management support is needed unless otherwise documented below in the visit note.  Continue to take 6 mg daily except 9 mg on Thursdays.  Re-check in 2 weeks. Patient will be checking INR with [facility]. Dosing instructions given to patient and they verbalized understanding.

## 2020-09-03 ENCOUNTER — Other Ambulatory Visit (HOSPITAL_COMMUNITY): Payer: Self-pay

## 2020-09-03 IMAGING — MR MR LUMBAR SPINE W/O CM
5 series · 30 of 48 positions shown · non-contrast
Comparison: MRI dated 09/30/2016

CLINICAL DATA: Progressive low back pain and bilateral leg pain.

EXAM:
MRI LUMBAR SPINE WITHOUT CONTRAST
TECHNIQUE: Multiplanar, multisequence MR imaging of the lumbar spine was
performed. No intravenous contrast was administered.

[Series 5: T2 · sagittal · 4.0mm · 0.73mm/px · 6 of 16 slices shown (1 of 2)]
[im 1/16]
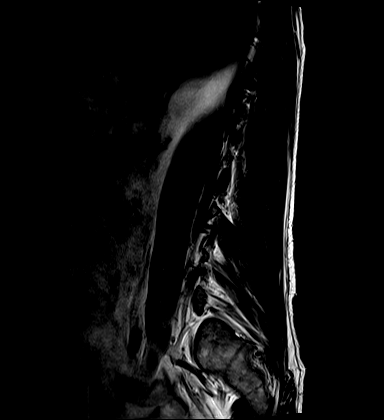
[im 4/16]
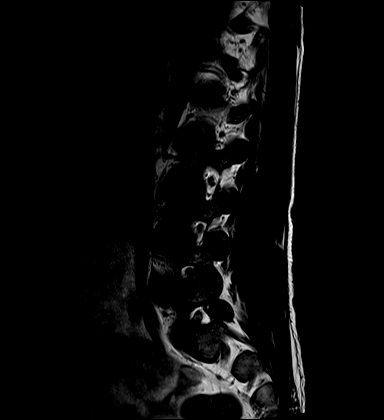
[im 7/16]
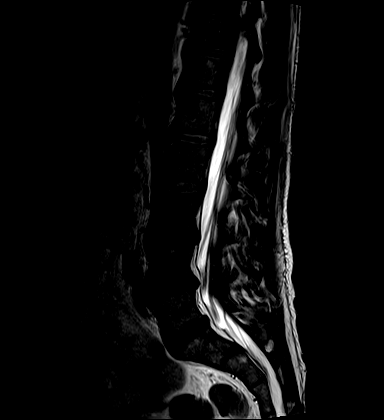
[im 10/16]
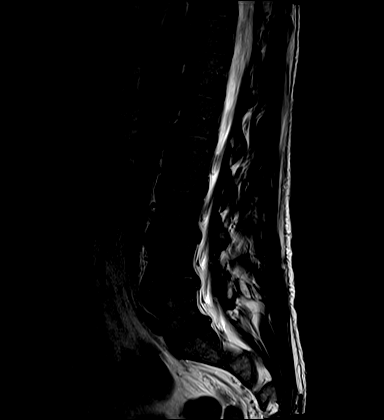
[im 13/16]
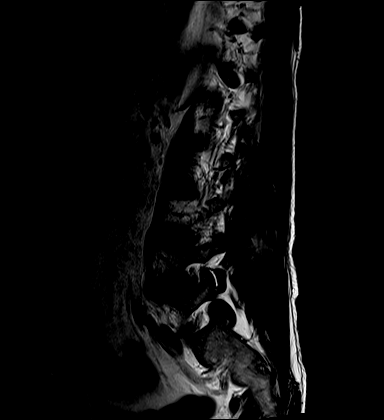
[im 16/16]
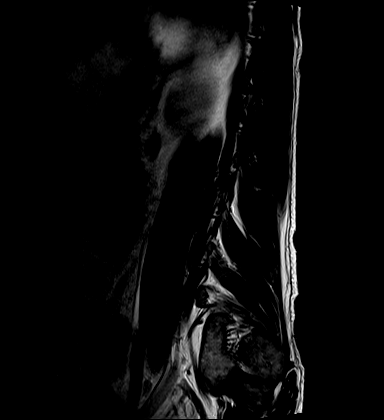

[Series 6: STIR · sagittal · 4.0mm · 0.55mm/px · 1 of 16 slices shown]
[im 1/16]
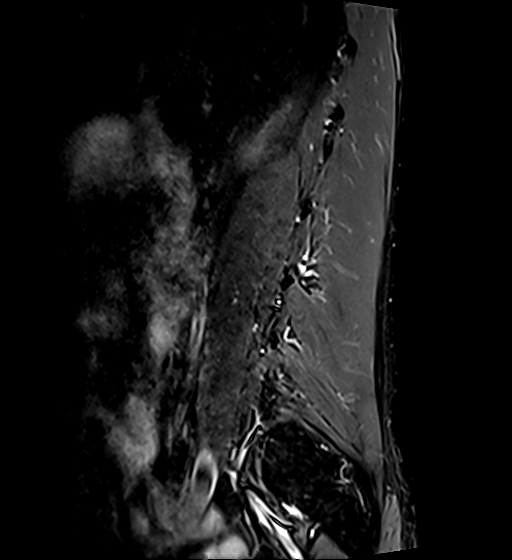

[Series 7: T1 · sagittal · 4.0mm · 0.88mm/px · 7 of 16 slices shown (1 of 2)]
[im 1/16]
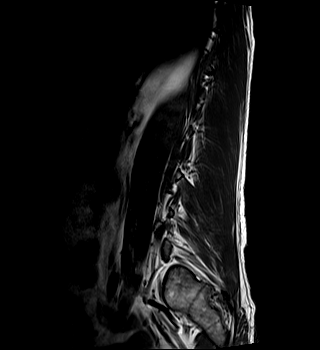
[im 3/16]
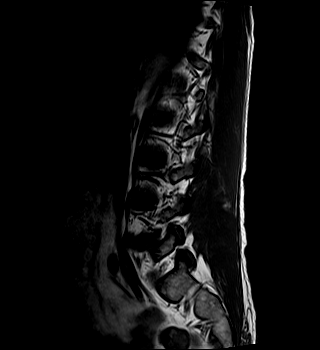
[im 6/16]
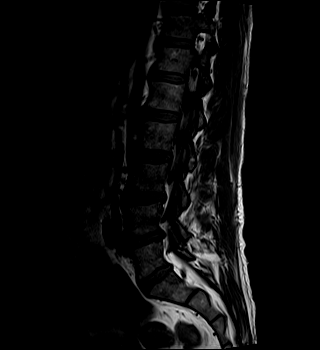
[im 8/16]
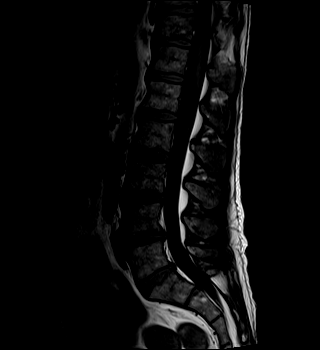
[im 11/16]
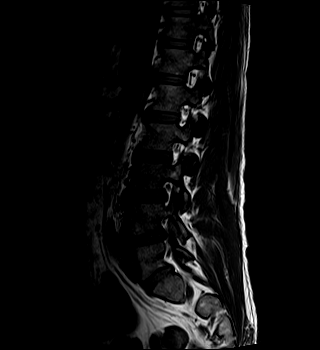
[im 13/16]
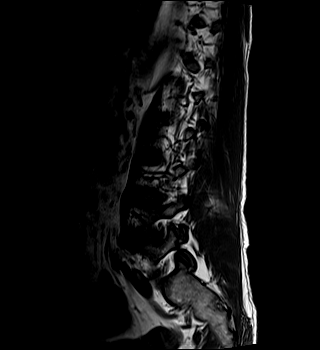
[im 16/16]
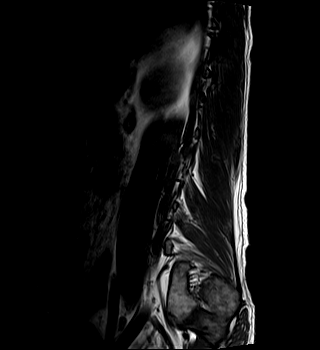

[Series 8: T2 · axial · 4.0mm · 0.69mm/px · z∈[-119,+80]mm · 8 of 34 slices shown (2 of 2)]
[im 1/34]
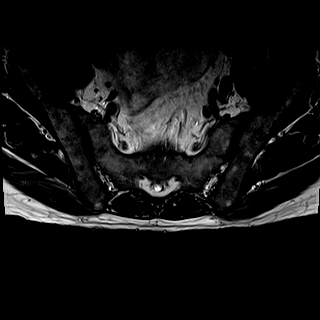
[im 6/34]
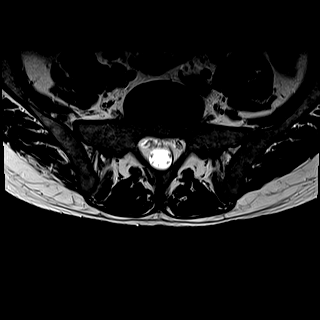
[im 11/34]
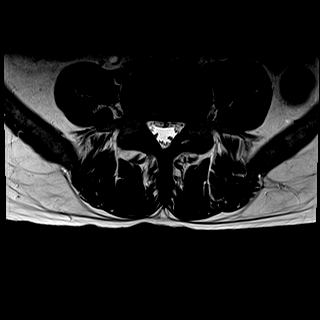
[im 16/34]
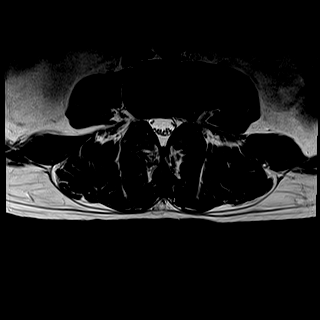
[im 18/34]
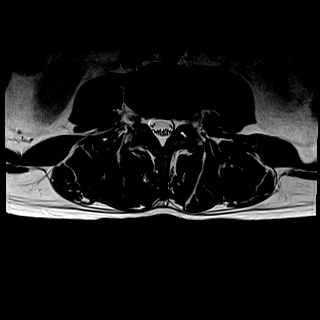
[im 23/34]
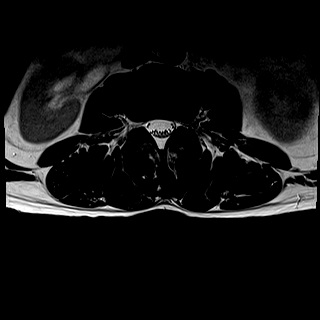
[im 28/34]
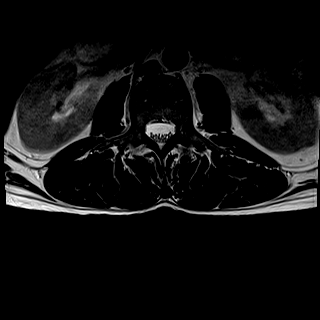
[im 34/34]
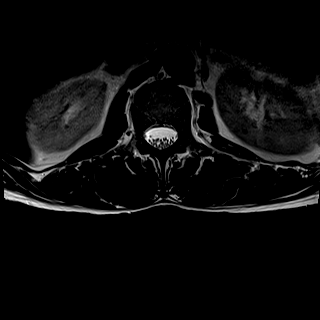

[Series 9: T1 · axial · 4.0mm · 0.43mm/px · z∈[-119,+80]mm · 8 of 34 slices shown (2 of 2)]
[im 1/34]
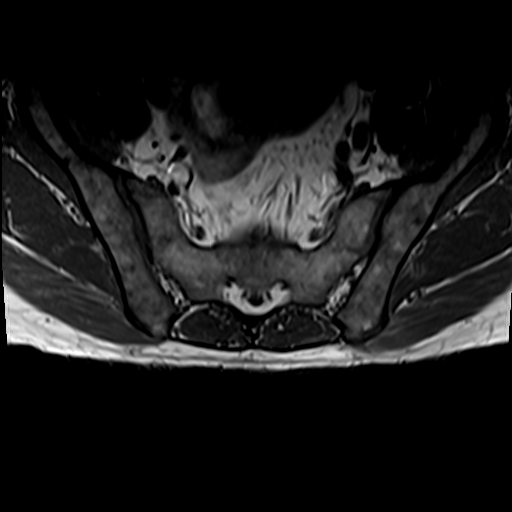
[im 6/34]
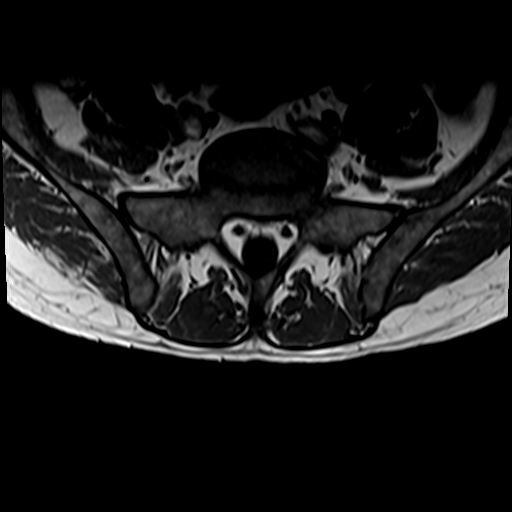
[im 11/34]
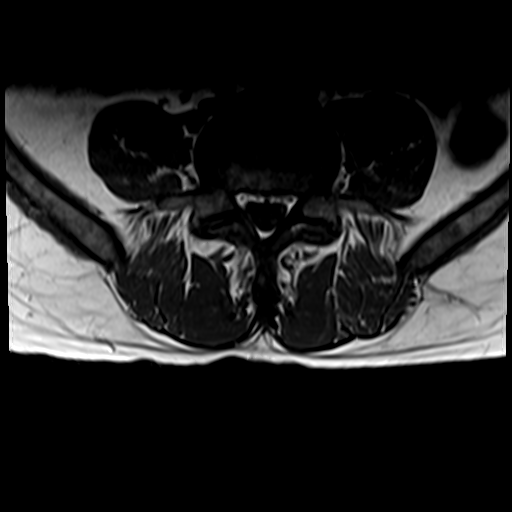
[im 16/34]
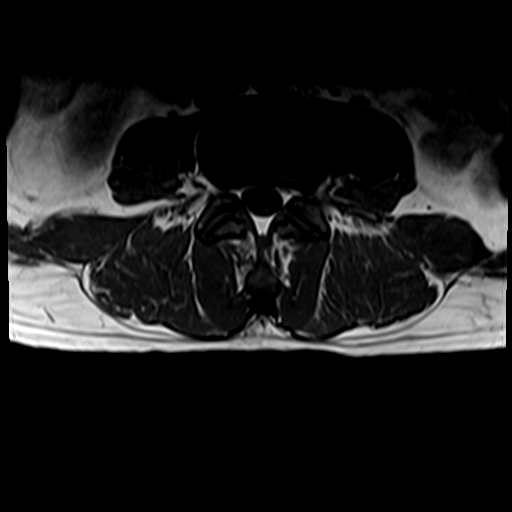
[im 18/34]
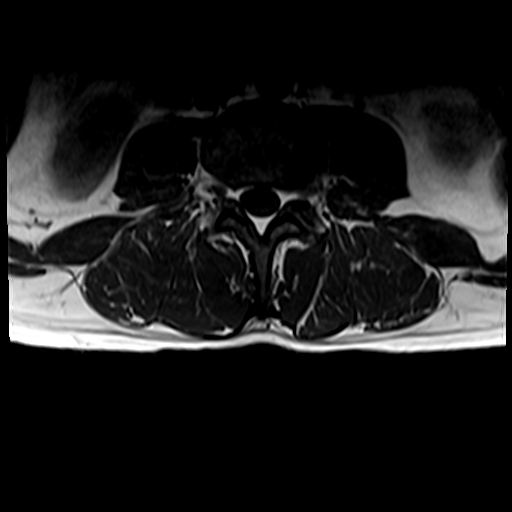
[im 23/34]
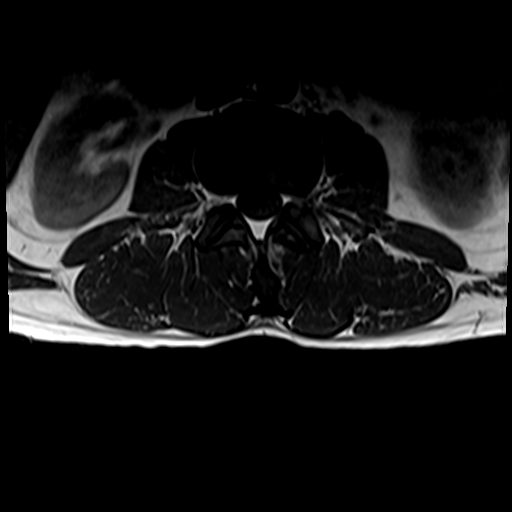
[im 28/34]
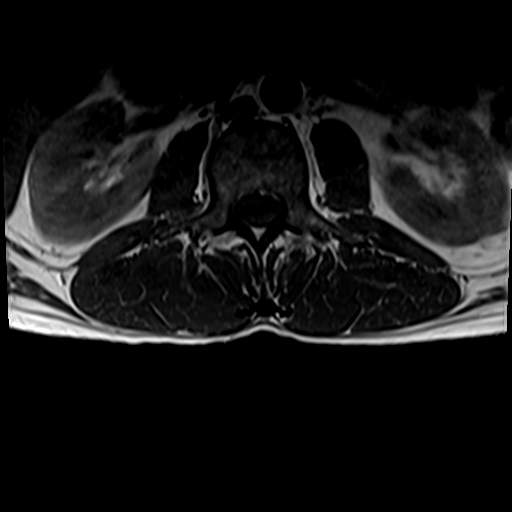
[im 34/34]
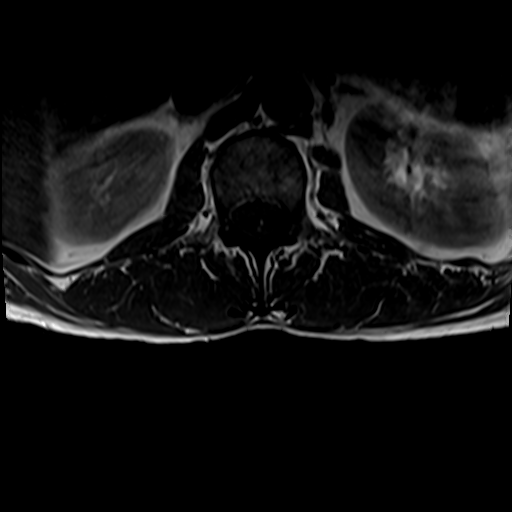

[30 of 48 positions shown; findings below may reference images not displayed]

FINDINGS: Segmentation:  Standard.

Alignment:  Physiologic.

Vertebrae:  No fracture, evidence of discitis, or bone lesion.

Conus medullaris and cauda equina: Conus extends to the L1 level.
Conus and cauda equina appear normal.

Paraspinal and other soft tissues: Negative.

Disc levels:

L1-2: Normal.

L2-3: Normal.

L3-4: Left far lateral annular tear without disc bulging or
protrusion. This is new since the prior study. Slight disc
desiccation. Otherwise normal.

L4-5: Slight disc desiccation. Slightly increased broad-based disc
bulge. Left far lateral annular tear. Slight narrowing of the neural
foramina bilaterally. However, the L4 nerves appear to exit without
impingement.

L5-S1: Small disc bulge and annular tear central and slightly to the
left without neural impingement. This appears less prominent than on
the prior study.
IMPRESSION: 1. Slight progression of degenerative disc disease at L4-5 with an
increased broad-based bulge without focal neural impingement.
2. New left far lateral annular tear at L3-4 without neural
impingement.

## 2020-09-03 MED FILL — Carvedilol Tab 6.25 MG: ORAL | 90 days supply | Qty: 180 | Fill #0 | Status: AC

## 2020-09-07 ENCOUNTER — Other Ambulatory Visit (HOSPITAL_COMMUNITY): Payer: Self-pay

## 2020-09-12 ENCOUNTER — Other Ambulatory Visit: Payer: Self-pay | Admitting: Family Medicine

## 2020-09-13 ENCOUNTER — Other Ambulatory Visit (HOSPITAL_COMMUNITY): Payer: Self-pay

## 2020-09-13 MED ORDER — CYCLOBENZAPRINE HCL 10 MG PO TABS
ORAL_TABLET | ORAL | 3 refills | Status: DC
  Filled 2020-09-13: qty 90, 90d supply, fill #0
  Filled 2020-12-10: qty 90, 90d supply, fill #1
  Filled 2021-04-10: qty 90, 90d supply, fill #2
  Filled 2021-08-03: qty 90, 90d supply, fill #3

## 2020-09-16 DIAGNOSIS — Z7901 Long term (current) use of anticoagulants: Secondary | ICD-10-CM | POA: Diagnosis not present

## 2020-09-16 DIAGNOSIS — R760 Raised antibody titer: Secondary | ICD-10-CM | POA: Diagnosis not present

## 2020-09-21 ENCOUNTER — Ambulatory Visit (INDEPENDENT_AMBULATORY_CARE_PROVIDER_SITE_OTHER): Payer: 59 | Admitting: General Practice

## 2020-09-21 DIAGNOSIS — Z7901 Long term (current) use of anticoagulants: Secondary | ICD-10-CM | POA: Diagnosis not present

## 2020-09-21 LAB — POCT INR: INR: 2.3 (ref 2.0–3.0)

## 2020-09-21 NOTE — Patient Instructions (Signed)
Pre visit review using our clinic review tool, if applicable. No additional management support is needed unless otherwise documented below in the visit note.  Take 9 mg today and then continue to take 6 mg daily except 9 mg on Thursdays.  Re-check in 2 weeks. Patient will be checking INR with [facility]. Dosing instructions given to patient and they verbalized understanding.

## 2020-09-21 NOTE — Progress Notes (Signed)
Medical screening examination/treatment/procedure(s) were performed by non-physician practitioner and as supervising physician I was immediately available for consultation/collaboration. I agree with above. Abdalla Naramore, MD   

## 2020-09-26 MED FILL — Budesonide-Glycopyrrolate-Formoterol Aers 160-9-4.8 MCG/ACT: RESPIRATORY_TRACT | 30 days supply | Qty: 10.7 | Fill #0 | Status: AC

## 2020-09-27 ENCOUNTER — Other Ambulatory Visit (HOSPITAL_COMMUNITY): Payer: Self-pay

## 2020-09-28 ENCOUNTER — Other Ambulatory Visit (HOSPITAL_COMMUNITY): Payer: Self-pay

## 2020-10-03 ENCOUNTER — Other Ambulatory Visit: Payer: Self-pay | Admitting: Family Medicine

## 2020-10-03 DIAGNOSIS — D6859 Other primary thrombophilia: Secondary | ICD-10-CM

## 2020-10-04 ENCOUNTER — Other Ambulatory Visit (HOSPITAL_COMMUNITY): Payer: Self-pay

## 2020-10-04 MED ORDER — WARFARIN SODIUM 6 MG PO TABS
ORAL_TABLET | ORAL | 1 refills | Status: DC
  Filled 2020-10-04: qty 90, 90d supply, fill #0
  Filled 2021-01-03: qty 90, 90d supply, fill #1

## 2020-10-05 ENCOUNTER — Other Ambulatory Visit (HOSPITAL_COMMUNITY): Payer: Self-pay

## 2020-10-07 ENCOUNTER — Ambulatory Visit (INDEPENDENT_AMBULATORY_CARE_PROVIDER_SITE_OTHER): Payer: 59 | Admitting: General Practice

## 2020-10-07 DIAGNOSIS — Z7901 Long term (current) use of anticoagulants: Secondary | ICD-10-CM | POA: Diagnosis not present

## 2020-10-07 LAB — POCT INR: INR: 2.9 (ref 2.0–3.0)

## 2020-10-07 NOTE — Progress Notes (Signed)
Medical screening examination/treatment/procedure(s) were performed by non-physician practitioner and as supervising physician I was immediately available for consultation/collaboration. I agree with above. Coren Crownover, MD   

## 2020-10-07 NOTE — Patient Instructions (Addendum)
Pre visit review using our clinic review tool, if applicable. No additional management support is needed unless otherwise documented below in the visit note.  Continue to take 6 mg daily except 9 mg on Thursdays.  Re-check in 2 weeks. Patient will be checking INR with [facility]. Dosing instructions given to patient and they verbalized understanding.  

## 2020-10-10 MED FILL — Fluoxetine HCl Tab 20 MG: ORAL | 90 days supply | Qty: 90 | Fill #0 | Status: AC

## 2020-10-11 ENCOUNTER — Other Ambulatory Visit (HOSPITAL_COMMUNITY): Payer: Self-pay

## 2020-10-12 ENCOUNTER — Ambulatory Visit (HOSPITAL_COMMUNITY): Admission: RE | Admit: 2020-10-12 | Payer: 59 | Source: Ambulatory Visit

## 2020-10-12 ENCOUNTER — Ambulatory Visit (HOSPITAL_COMMUNITY)
Admission: RE | Admit: 2020-10-12 | Discharge: 2020-10-12 | Disposition: A | Discharge status: Home / Self Care | Payer: 59 | Source: Ambulatory Visit | Attending: Physician Assistant | Admitting: Physician Assistant

## 2020-10-12 ENCOUNTER — Other Ambulatory Visit (HOSPITAL_COMMUNITY): Payer: Self-pay | Admitting: Vascular Surgery

## 2020-10-12 ENCOUNTER — Ambulatory Visit (INDEPENDENT_AMBULATORY_CARE_PROVIDER_SITE_OTHER): Payer: 59 | Admitting: Physician Assistant

## 2020-10-12 ENCOUNTER — Other Ambulatory Visit: Payer: Self-pay

## 2020-10-12 ENCOUNTER — Ambulatory Visit (INDEPENDENT_AMBULATORY_CARE_PROVIDER_SITE_OTHER)
Admission: RE | Admit: 2020-10-12 | Discharge: 2020-10-12 | Disposition: A | Discharge status: Home / Self Care | Payer: 59 | Source: Ambulatory Visit | Attending: Vascular Surgery | Admitting: Vascular Surgery

## 2020-10-12 VITALS — BP 122/80 | HR 87 | Temp 98.2°F | Resp 20 | Ht 69.0 in | Wt 176.5 lb

## 2020-10-12 DIAGNOSIS — I700 Atherosclerosis of aorta: Secondary | ICD-10-CM | POA: Insufficient documentation

## 2020-10-12 DIAGNOSIS — I779 Disorder of arteries and arterioles, unspecified: Secondary | ICD-10-CM | POA: Insufficient documentation

## 2020-10-12 DIAGNOSIS — Z09 Encounter for follow-up examination after completed treatment for conditions other than malignant neoplasm: Secondary | ICD-10-CM

## 2020-10-12 NOTE — Progress Notes (Addendum)
Peripheral Arterial Disease Follow-Up   VASCULAR SURGERY ASSESSMENT & PLAN:   [name] is a 55 y.o. male with history of right common iliac angioplasty and stenting by Dr. Bridgett Larsson in 2017. It was for lifestyle limiting short distance claudication. He later required thrombectomy of the right common iliac artery as well as right common femoral artery patch angioplasty 2018. We have been following some stenosis in the right distal common iliac artery stent and just distal to the stent since December of 2020.  He was seen 2 months ago with decrease in his right and left toe pressures.  He presents today to follow-up right lower extremity duplex and repeat ABIs.  His wife accompanies him today.  Peripheral arterial disease.  Unable to obtain right great toe pressure.  Patient is without claudication or rest pain. Encouraged complete smoking cessation. Continue the following medications: Pletal, statin Follow-up in 4 months with right iliac arterial duplex and ABIs.  He is encouraged to follow-up sooner should he develop right lower extremity pain or nonhealing wounds.  SUBJECTIVE:   The patient denies lower extremity pain with exercise or rest pain.  Denies skin loss or ulceration.  He was experiencing right buttock pain which was attributed to his back and he underwent epidural injection since his last visit here.  He says this has improved his symptoms.  PHYSICAL EXAM:    General appearance: Well-developed, well-nourished in no apparent distress Neurologic: Alert and oriented x 4. Cardiovascular: Irregularly irregular heart rhythm, normal rate. Abdomen: No palpable pulsatile mass. Extremities: Skin intact.  Both feet are warm and well perfused. Right great toe is slightly cool to palpation without skin color change. Motor function and sensation intact Pulse exam: 2+ femoral, 2+ left dorsalis pedis pulses, 2+ radial pulses bilaterally   NON-INVASIVE VASCULAR STUDIES   10/12/2020 ABIs: ABI/TBIToday's ABIToday's TBIPrevious ABIPrevious TBI  +-------+-----------+-----------+------------+------------+  Right 1.06    0.00    1.06    0.53      +-------+-----------+-----------+------------+------------+  Left  1.04    0.87    1.16    0.70      +-------+-----------+-----------+------------+------------+    Summary:  Right: Resting right ankle-brachial index is within normal range. No  evidence of significant right lower extremity arterial disease. The right  toe waveform is absent.   Left: Resting left ankle-brachial index is within normal range. No  evidence of significant left lower extremity arterial disease. The left  toe-brachial index is normal.   Right lower extremity arterial duplex: Right: Normal examination. No evidence of arterial occlusive disease.     PROBLEM LIST:    The patient's past medical history, past surgical history, family history, social history, allergy list and medication list are reviewed. He is not diabetic.  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   CURRENT MEDS:    Current Outpatient Medications:  .  Budeson-Glycopyrrol-Formoterol 160-9-4.8 MCG/ACT AERO, INHALE 2 PUFFS BY MOUTH INTO LUNGS IN THE MORNING AND AT BEDTIME, Disp: 10.7 g, Rfl: 5 .  carvedilol (COREG) 6.25 MG tablet, TAKE 1 TABLET BY MOUTH TWICE DAILY, Disp: 60 tablet, Rfl: 11 .  cilostazol (PLETAL) 50 MG tablet, TAKE 1 TABLET BY MOUTH TWICE DAILY, Disp: 180 tablet, Rfl: 3 .  cyclobenzaprine (FLEXERIL) 10 MG tablet, TAKE 1 TABLET BY MOUTH AT BEDTIME AS NEEDED FOR MUSCLE SPASMS, Disp: 90 tablet, Rfl: 3 .  enoxaparin (LOVENOX) 120 MG/0.8ML injection, INJECT 120 MG (1 SYRINGE) INTO THE SKIN DAILY FOR 7 DOSES., Disp:  5.6 mL, Rfl: 0 .  esomeprazole (NEXIUM) 20 MG capsule, Take 20 mg by mouth daily at 12 noon., Disp: , Rfl:  .  FLUoxetine (PROZAC) 20 MG tablet, TAKE 1 TABLET  BY MOUTH ONCE DAILY, Disp: 90 tablet, Rfl: 3 .  gabapentin (NEURONTIN) 600 MG tablet, Take 1 tablet (600 mg total) by mouth 3 (three) times daily., Disp: 270 tablet, Rfl: 3 .  PFIZER-BIONTECH COVID-19 VACC 30 MCG/0.3ML injection, , Disp: , Rfl:  .  rosuvastatin (CRESTOR) 10 MG tablet, TAKE 1 TABLET (10 MG TOTAL) BY MOUTH DAILY., Disp: 90 tablet, Rfl: 3 .  warfarin (COUMADIN) 6 MG tablet, TAKE 1 TABLET BY MOUTH ONCE DAILY OR AS DIRECTED BY ANTICOAGULATION CLINIC, Disp: 90 tablet, Rfl: 1   REVIEW OF SYSTEMS:   [X]  denotes positive finding, [ ]  denotes negative finding Cardiac  Comments:  Chest pain or chest pressure:    Shortness of breath upon exertion:    Short of breath when lying flat:    Irregular heart rhythm:        Vascular    Pain in calf, thigh, or hip brought on by ambulation:    Pain in feet at night that wakes you up from your sleep:     Blood clot in your veins:    Leg swelling:         Pulmonary    Oxygen at home:    Productive cough:     Wheezing:         Neurologic    Sudden weakness in arms or legs:     Sudden numbness in arms or legs:     Sudden onset of difficulty speaking or slurred speech:    Temporary loss of vision in one eye:     Problems with dizziness:         Gastrointestinal    Blood in stool:     Vomited blood:         Genitourinary    Burning when urinating:     Blood in urine:        Psychiatric    Major depression:         Hematologic    Bleeding problems:    Problems with blood clotting too easily:        Skin    Rashes or ulcers:        Constitutional    Fever or chills:     Barbie Banner, PA-C  Office: [phone] 10/12/2020

## 2020-10-15 ENCOUNTER — Other Ambulatory Visit: Payer: Self-pay

## 2020-10-15 DIAGNOSIS — I779 Disorder of arteries and arterioles, unspecified: Secondary | ICD-10-CM

## 2020-10-21 ENCOUNTER — Ambulatory Visit (INDEPENDENT_AMBULATORY_CARE_PROVIDER_SITE_OTHER): Payer: 59 | Admitting: General Practice

## 2020-10-21 DIAGNOSIS — Z7901 Long term (current) use of anticoagulants: Secondary | ICD-10-CM | POA: Diagnosis not present

## 2020-10-21 LAB — POCT INR: INR: 4 — AB (ref 2.0–3.0)

## 2020-10-21 NOTE — Patient Instructions (Signed)
Pre visit review using our clinic review tool, if applicable. No additional management support is needed unless otherwise documented below in the visit note.  Skip dosage today and take 1/2 tablet (3 mg) tomorrow.  On Saturday continue to take 6 mg daily except 9 mg on Thursdays.  Re-check in 2 weeks. Patient will be checking INR with [facility]. Dosing instructions given to patient and they verbalized understanding.

## 2020-10-21 NOTE — Progress Notes (Signed)
Medical screening examination/treatment/procedure(s) were performed by non-physician practitioner and as supervising physician I was immediately available for consultation/collaboration. I agree with above. Makisha Marrin, MD   

## 2020-11-06 ENCOUNTER — Other Ambulatory Visit: Payer: Self-pay | Admitting: Family Medicine

## 2020-11-06 DIAGNOSIS — I739 Peripheral vascular disease, unspecified: Secondary | ICD-10-CM

## 2020-11-08 ENCOUNTER — Other Ambulatory Visit (HOSPITAL_COMMUNITY): Payer: Self-pay

## 2020-11-08 MED ORDER — CILOSTAZOL 50 MG PO TABS
ORAL_TABLET | Freq: Two times a day (BID) | ORAL | 3 refills | Status: DC
  Filled 2020-11-08: qty 180, 90d supply, fill #0
  Filled 2021-02-07: qty 180, 90d supply, fill #1
  Filled 2021-05-08: qty 180, 90d supply, fill #2
  Filled 2021-08-09: qty 180, 90d supply, fill #3

## 2020-11-09 ENCOUNTER — Other Ambulatory Visit (HOSPITAL_COMMUNITY): Payer: Self-pay

## 2020-11-09 ENCOUNTER — Ambulatory Visit (INDEPENDENT_AMBULATORY_CARE_PROVIDER_SITE_OTHER): Payer: 59 | Admitting: General Practice

## 2020-11-09 DIAGNOSIS — Z7901 Long term (current) use of anticoagulants: Secondary | ICD-10-CM

## 2020-11-09 LAB — POCT INR: INR: 3.6 — AB (ref 2.0–3.0)

## 2020-11-09 NOTE — Progress Notes (Signed)
Medical screening examination/treatment/procedure(s) were performed by non-physician practitioner and as supervising physician I was immediately available for consultation/collaboration. I agree with above. Jagjit Riner, MD   

## 2020-11-09 NOTE — Patient Instructions (Signed)
Pre visit review using our clinic review tool, if applicable. No additional management support is needed unless otherwise documented below in the visit note.  Skip dosage today and start taking  6 mg daily .  Re-check in 2 weeks. Patient will be checking INR with [facility]. Dosing instructions given to patient and they verbalized understanding.

## 2020-11-16 ENCOUNTER — Other Ambulatory Visit: Payer: Self-pay | Admitting: Family Medicine

## 2020-11-17 ENCOUNTER — Other Ambulatory Visit (HOSPITAL_COMMUNITY): Payer: Self-pay

## 2020-11-17 MED ORDER — BREZTRI AEROSPHERE 160-9-4.8 MCG/ACT IN AERO
INHALATION_SPRAY | RESPIRATORY_TRACT | 5 refills | Status: DC
  Filled 2020-11-17: qty 10.7, 30d supply, fill #0
  Filled 2021-01-14: qty 10.7, 30d supply, fill #1

## 2020-11-30 DIAGNOSIS — R760 Raised antibody titer: Secondary | ICD-10-CM | POA: Diagnosis not present

## 2020-11-30 DIAGNOSIS — Z7901 Long term (current) use of anticoagulants: Secondary | ICD-10-CM | POA: Diagnosis not present

## 2020-12-01 ENCOUNTER — Ambulatory Visit (INDEPENDENT_AMBULATORY_CARE_PROVIDER_SITE_OTHER): Payer: 59 | Admitting: General Practice

## 2020-12-01 DIAGNOSIS — Z7901 Long term (current) use of anticoagulants: Secondary | ICD-10-CM | POA: Diagnosis not present

## 2020-12-01 LAB — POCT INR: INR: 2.7 (ref 2.0–3.0)

## 2020-12-01 NOTE — Progress Notes (Signed)
I have reviewed and agree with this plan   Dejai Schubach, NP  

## 2020-12-01 NOTE — Patient Instructions (Signed)
Pre visit review using our clinic review tool, if applicable. No additional management support is needed unless otherwise documented below in the visit note.  Continue to take  6 mg daily .  Re-check in 2 weeks. Patient will be checking INR with [facility]. Dosing instructions given to patient and they verbalized understanding.  Left VM and also gave instructions to patient's wife, Almyra Free.

## 2020-12-05 MED FILL — Carvedilol Tab 6.25 MG: ORAL | 30 days supply | Qty: 60 | Fill #1 | Status: AC

## 2020-12-05 MED FILL — Rosuvastatin Calcium Tab 10 MG: ORAL | 90 days supply | Qty: 90 | Fill #0 | Status: AC

## 2020-12-06 ENCOUNTER — Other Ambulatory Visit (HOSPITAL_COMMUNITY): Payer: Self-pay

## 2020-12-11 ENCOUNTER — Other Ambulatory Visit (HOSPITAL_COMMUNITY): Payer: Self-pay

## 2020-12-13 ENCOUNTER — Other Ambulatory Visit (HOSPITAL_COMMUNITY): Payer: Self-pay

## 2020-12-27 ENCOUNTER — Ambulatory Visit (INDEPENDENT_AMBULATORY_CARE_PROVIDER_SITE_OTHER): Payer: 59 | Admitting: General Practice

## 2020-12-27 DIAGNOSIS — Z7901 Long term (current) use of anticoagulants: Secondary | ICD-10-CM | POA: Diagnosis not present

## 2020-12-27 LAB — POCT INR: INR: 3.1 — AB (ref 2.0–3.0)

## 2020-12-27 NOTE — Patient Instructions (Signed)
Pre visit review using our clinic review tool, if applicable. No additional management support is needed unless otherwise documented below in the visit note.  Please take 1/2 tablet today and then continue to take  6 mg daily .  Re-check in 2 weeks. Patient will be checking INR with [facility]. Dosing instructions given to patient and they verbalized understanding.  Left VM and also gave instructions to patient's wife, Almyra Free.

## 2021-01-03 ENCOUNTER — Other Ambulatory Visit: Payer: Self-pay | Admitting: Cardiology

## 2021-01-03 MED FILL — Fluoxetine HCl Tab 20 MG: ORAL | 90 days supply | Qty: 90 | Fill #1 | Status: AC

## 2021-01-04 ENCOUNTER — Other Ambulatory Visit (HOSPITAL_COMMUNITY): Payer: Self-pay

## 2021-01-04 MED ORDER — CARVEDILOL 6.25 MG PO TABS
ORAL_TABLET | Freq: Two times a day (BID) | ORAL | 11 refills | Status: DC
  Filled 2021-01-04: qty 60, 30d supply, fill #0
  Filled 2021-02-07: qty 60, 30d supply, fill #1
  Filled 2021-03-06: qty 60, 30d supply, fill #2
  Filled 2021-04-10: qty 60, 30d supply, fill #3
  Filled 2021-05-08: qty 60, 30d supply, fill #4
  Filled 2021-06-05: qty 180, 90d supply, fill #5
  Filled 2021-09-09: qty 180, 90d supply, fill #6
  Filled 2021-12-04: qty 60, 30d supply, fill #7

## 2021-01-05 ENCOUNTER — Other Ambulatory Visit (HOSPITAL_COMMUNITY): Payer: Self-pay

## 2021-01-12 ENCOUNTER — Other Ambulatory Visit: Payer: Self-pay

## 2021-01-12 DIAGNOSIS — M2550 Pain in unspecified joint: Secondary | ICD-10-CM

## 2021-01-14 ENCOUNTER — Ambulatory Visit (INDEPENDENT_AMBULATORY_CARE_PROVIDER_SITE_OTHER): Payer: 59

## 2021-01-14 ENCOUNTER — Other Ambulatory Visit (INDEPENDENT_AMBULATORY_CARE_PROVIDER_SITE_OTHER): Payer: 59

## 2021-01-14 ENCOUNTER — Other Ambulatory Visit: Payer: Self-pay

## 2021-01-14 DIAGNOSIS — M2550 Pain in unspecified joint: Secondary | ICD-10-CM | POA: Diagnosis not present

## 2021-01-14 DIAGNOSIS — Z7901 Long term (current) use of anticoagulants: Secondary | ICD-10-CM

## 2021-01-14 LAB — CBC WITH DIFFERENTIAL/PLATELET
Basophils Absolute: 0.1 10*3/uL (ref 0.0–0.1)
Basophils Relative: 1 % (ref 0.0–3.0)
Eosinophils Absolute: 0.5 10*3/uL (ref 0.0–0.7)
Eosinophils Relative: 3.6 % (ref 0.0–5.0)
HCT: 46.8 % (ref 39.0–52.0)
Hemoglobin: 15.5 g/dL (ref 13.0–17.0)
Lymphocytes Relative: 21.7 % (ref 12.0–46.0)
Lymphs Abs: 2.8 10*3/uL (ref 0.7–4.0)
MCHC: 33.2 g/dL (ref 30.0–36.0)
MCV: 97.6 fl (ref 78.0–100.0)
Monocytes Absolute: 0.6 10*3/uL (ref 0.1–1.0)
Monocytes Relative: 4.9 % (ref 3.0–12.0)
Neutro Abs: 9 10*3/uL — ABNORMAL HIGH (ref 1.4–7.7)
Neutrophils Relative %: 68.8 % (ref 43.0–77.0)
Platelets: 213 10*3/uL (ref 150.0–400.0)
RBC: 4.79 Mil/uL (ref 4.22–5.81)
RDW: 13.6 % (ref 11.5–15.5)
WBC: 13 10*3/uL — ABNORMAL HIGH (ref 4.0–10.5)

## 2021-01-14 LAB — COMPREHENSIVE METABOLIC PANEL
ALT: 22 U/L (ref 0–53)
AST: 19 U/L (ref 0–37)
Albumin: 4 g/dL (ref 3.5–5.2)
Alkaline Phosphatase: 93 U/L (ref 39–117)
BUN: 7 mg/dL (ref 6–23)
CO2: 23 mEq/L (ref 19–32)
Calcium: 9.3 mg/dL (ref 8.4–10.5)
Chloride: 103 mEq/L (ref 96–112)
Creatinine, Ser: 1.04 mg/dL (ref 0.40–1.50)
GFR: 81.97 mL/min (ref >60.00)
Glucose, Bld: 104 mg/dL — ABNORMAL HIGH (ref 70–99)
Potassium: 3.9 mEq/L (ref 3.5–5.1)
Sodium: 136 mEq/L (ref 135–145)
Total Bilirubin: 0.8 mg/dL (ref 0.2–1.2)
Total Protein: 7.3 g/dL (ref 6.0–8.3)

## 2021-01-14 LAB — POCT INR: INR: 5.1 — AB (ref 2.0–3.0)

## 2021-01-14 LAB — FERRITIN: Ferritin: 28.6 ng/mL (ref 22.0–322.0)

## 2021-01-14 LAB — IBC PANEL
Iron: 83 ug/dL (ref 42–165)
Saturation Ratios: 21.9 % (ref 20.0–50.0)
TIBC: 379.4 ug/dL (ref 250.0–450.0)
Transferrin: 271 mg/dL (ref 212.0–360.0)

## 2021-01-14 NOTE — Progress Notes (Signed)
I have reviewed and agree with note, evaluation, plan.   Varnell Orvis, MD  

## 2021-01-14 NOTE — Patient Instructions (Signed)
Pre visit review using our clinic review tool, if applicable. No additional management support is needed unless otherwise documented below in the visit note.  Pt's wife, Almyra Free, reports pt has not been eating and reports when pt does not eat his INR goes up every time. Denies any other changes. Hold all coumadin and retest on Mon, 8/29 and leave result on VM.

## 2021-01-15 ENCOUNTER — Other Ambulatory Visit (HOSPITAL_COMMUNITY): Payer: Self-pay

## 2021-01-17 ENCOUNTER — Ambulatory Visit (INDEPENDENT_AMBULATORY_CARE_PROVIDER_SITE_OTHER): Payer: 59

## 2021-01-17 DIAGNOSIS — Z7901 Long term (current) use of anticoagulants: Secondary | ICD-10-CM | POA: Diagnosis not present

## 2021-01-17 LAB — POCT INR: INR: 1.4 — AB (ref 2.0–3.0)

## 2021-01-17 NOTE — Patient Instructions (Addendum)
Pre visit review using our clinic review tool, if applicable. No additional management support is needed unless otherwise documented below in the visit note.  Advised to increase dose today to '9mg'$  and increase dose tomorrow to '9mg'$  and then return to normal dosing. Recheck in 1 wk.

## 2021-01-25 ENCOUNTER — Ambulatory Visit (INDEPENDENT_AMBULATORY_CARE_PROVIDER_SITE_OTHER): Payer: 59

## 2021-01-25 DIAGNOSIS — Z7901 Long term (current) use of anticoagulants: Secondary | ICD-10-CM

## 2021-01-25 LAB — POCT INR: INR: 3 (ref 2.0–3.0)

## 2021-01-25 LAB — BASIC METABOLIC PANEL: Potassium: 3 — AB (ref 3.4–5.3)

## 2021-01-25 NOTE — Progress Notes (Signed)
Medical screening examination/treatment/procedure(s) were performed by non-physician practitioner and as supervising physician I was immediately available for consultation/collaboration. I agree with above. Norita Meigs, MD   

## 2021-01-25 NOTE — Patient Instructions (Addendum)
Pre visit review using our clinic review tool, if applicable. No additional management support is needed unless otherwise documented below in the visit note.  Continue '6mg'$  daily and recheck in 2 wk.

## 2021-01-26 ENCOUNTER — Encounter: Payer: Self-pay | Admitting: Family Medicine

## 2021-01-30 ENCOUNTER — Encounter: Payer: Self-pay | Admitting: Family Medicine

## 2021-02-04 ENCOUNTER — Encounter: Payer: Self-pay | Admitting: Family Medicine

## 2021-02-07 ENCOUNTER — Other Ambulatory Visit (HOSPITAL_COMMUNITY): Payer: Self-pay

## 2021-02-08 ENCOUNTER — Other Ambulatory Visit (HOSPITAL_COMMUNITY): Payer: Self-pay

## 2021-02-08 ENCOUNTER — Ambulatory Visit (INDEPENDENT_AMBULATORY_CARE_PROVIDER_SITE_OTHER): Payer: 59

## 2021-02-08 DIAGNOSIS — Z7901 Long term (current) use of anticoagulants: Secondary | ICD-10-CM

## 2021-02-08 DIAGNOSIS — R760 Raised antibody titer: Secondary | ICD-10-CM | POA: Diagnosis not present

## 2021-02-08 LAB — POCT INR: INR: 3.1 — AB (ref 2.0–3.0)

## 2021-02-08 NOTE — Patient Instructions (Addendum)
Pre visit review using our clinic review tool, if applicable. No additional management support is needed unless otherwise documented below in the visit note.  Take 1/2 tablet today and then continue 6mg  daily and recheck in 2 wk.

## 2021-02-14 NOTE — Progress Notes (Signed)
[facility] [place] [address] [place] Phone: [phone] Subjective:   IVilma Meckel, am serving as a scribe for Dr. Hulan Saas. This visit occurred during the SARS-CoV-2 public health emergency.  Safety protocols were in place, including screening questions prior to the visit, additional usage of staff PPE, and extensive cleaning of exam room while observing appropriate contact time as indicated for disinfecting solutions.   I'm seeing this patient by the request  of:  Leamon Arnt, MD  CC: Low back pain follow-up  [id number]  07/09/2020 Patient has had difficulty with the radicular symptoms still at this time.  Differential includes the vascular aspect but patient is scheduled for another ABI in 1 month and last one in September was doing well.  Patient does continue with the Pletal, gabapentin and is on Coumadin which does limit our options.  Patient's back pain is significantly better with the epidural but I do feel like repeating could be beneficial.  Patient's most recent MRI in 2020 did not show any true nerve impingement but we could consider either repeating imaging or potentially try a nerve root injection if this does not help.  Patient is in agreement with the plan follow-up with me again in 5 to 6 weeks after epidural  Update 02/15/2021 Jachob Mcclean is a 55 y.o. male coming in with complaint of back pain. Epidural on 08/09/2020. Patient states that the epidural lasted a while, but has recently worn off and he is back to his original pain. Wants to discuss possibly another epidural or steroids. Patient states that the only difference is he is noticing more tightness of the right flank area.  Denies any hematuria.  Patient states that it seems to be worse with any type of extension.     Past Medical History:  Diagnosis Date   Anticoagulated on Coumadin    Antiphospholipid antibody positive, followed by Dr. Irene Limbo, Hematology  06/27/2016   Anxiety with depression 06/24/2014   Chronic back pain    ED (erectile dysfunction)    GERD (gastroesophageal reflux disease)    Hemorrhoids, internal, with bleeding    Hiatal hernia    History of adenomatous polyp of colon, 07/08/16, tubular adenomas    History of atrial fibrillation, episode 08/2016    History of cardiomyopathy ([place]) 08/04/2018   ECHO 12/02/18  1. The left ventricle has normal systolic function with an ejection fraction of 60-65%. The cavity size was normal. Left ventricular diastolic parameters were normal.  2. The right ventricle has normal systolic function. The cavity was normal. There is no increase in right ventricular wall thickness.  3. The mitral valve is grossly normal.  4. The aortic valve is tricuspid. Mild thic   History of DVT of lower extremity    Hyperlipidemia    Iron deficiency anemia due to chronic blood loss, thought to be from hemorrhoids while on coumadin, s/p iron infusion and blood transfusion x 2, 2018 2018   Low testosterone in male 05/04/2017   2018, 2019: Decision would be risk vs benefit. Could increase thrombotic risk especially if he has a polycythemic response.  If he decided to pursue this with an understanding of the risk - would use lowest dose of testosterone to maintain level low normal 300-400.  2020: Secondary polycythemia. Not a candidate.   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[facility]), sober since 04/2016    S/P insertion of iliac artery stent 2018   02-23-2017  right CIA PTA and stent/05-11-2016 in-stent restenosis s/p thrombectomy/2018 thrombectomy and  patch angioplasty right femoral artery    Past Surgical  History:  Procedure Laterality Date   ANGIOPLASTY ILLIAC ARTERY Right 07/17/2016   Procedure: ANGIOPLASTY RIGHT COMMON ILIAC ARTERY;  Surgeon: Conrad [place], MD;  Location: [place];  Service: Vascular;  Laterality: Right;   ANTERIOR CERVICAL DECOMP/DISCECTOMY FUSION  07/21/2010   C5 -- C7   CARDIOVASCULAR STRESS TEST  06-14-2016   dr hilty   normal perfusion study w/ no reversible ischemia/  stress ef 44% but visually looks better , echo ordered (LVEF 30-44%)  , normal LV wall motion    COLONOSCOPY N/A 07/08/2016   Procedure: COLONOSCOPY;  Surgeon: Clarene Essex, MD;  Location: [facility];  Service: Endoscopy;  Laterality: N/A;   ESOPHAGOGASTRODUODENOSCOPY N/A 07/08/2016   Procedure: ESOPHAGOGASTRODUODENOSCOPY (EGD);  Surgeon: Clarene Essex, MD;  Location: [facility];  Service: Endoscopy;  Laterality: N/A;   GROIN DEBRIDEMENT Right 07/25/2016   Procedure: EVACUATION HEMATOMA RIGHT GROIN;  Surgeon: Waynetta Sandy, MD;  Location: [place];  Service: Vascular;  Laterality: Right;   HEMORRHOID SURGERY N/A 12/07/2016   Procedure: 3 COLUMN HEMORRHOIDECTOMY;  Surgeon: Leighton Ruff, MD;  Location: [facility];  Service: General;  Laterality: N/A;   INTRAOPERATIVE ARTERIOGRAM Right 07/17/2016   Procedure: INTRA OPERATIVE ANGIOGRAM OF RIGHT COMMON ILIAC ARTERY;  Surgeon: Conrad Carson, MD;  Location: [place];  Service: Vascular;  Laterality: Right;   KNEE SURGERY Right 1983   cartilage   PATCH ANGIOPLASTY Right 07/17/2016   Procedure: PATCH ANGIOPLASTY RIGHT FEMORAL ARTERY USING Rueben Bash BIOLOGIC PATCH;  Surgeon: Conrad Pennside, MD;  Location: [place];  Service: Vascular;  Laterality: Right;   PERIPHERAL VASCULAR CATHETERIZATION N/A 02/24/2016   Procedure: Abdominal Aortogram w/Lower Extremity;  Surgeon: Conrad Dahlgren, MD;  Location: [place] [facility];  Service: Cardiovascular;  Laterality: N/A;   PERIPHERAL VASCULAR CATHETERIZATION Right 02/24/2016   Procedure: Peripheral Vascular Intervention;   Surgeon: Conrad San[place], MD;  Location: [place] [facility];  Service: Cardiovascular;  Laterality: Right;  Common iliac   PERIPHERAL VASCULAR CATHETERIZATION N/A 05/11/2016   Procedure: Abdominal Aortogram;  Surgeon: Conrad [place], MD;  Location: [place] [facility];  Service: Cardiovascular;  Laterality: N/A;   PERIPHERAL VASCULAR CATHETERIZATION N/A 05/11/2016   Procedure: Lower Extremity Angiography;  Surgeon: Conrad Letona, MD;  Location: [place] [facility];  Service: Cardiovascular;  Laterality: N/A;   POSTERIOR CERVICAL FUSION/FORAMINOTOMY N/A 07/05/2018   Procedure: Posterior cervical fusion with lateral mass fixation - Cervical five-Cervical six, Cervical six-Cervical seven;  Surgeon: Earnie Larsson, MD;  Location: [place];  Service: Neurosurgery;  Laterality: N/A;   THROMBECTOMY FEMORAL ARTERY Right 07/17/2016   Procedure: THROMBECTOMY RIGHT FEMORAL ARTERY;  Surgeon: Conrad , MD;  Location: [employer] [facility];  Service: Vascular;  Laterality: Right;   TRANSTHORACIC ECHOCARDIOGRAM  06-20-2016   dr hilty   EF 55-60%,  grade 1 diastolic dysfunction, mild TR   Social History   Socioeconomic History   Marital status: Married    Spouse name: Almyra Free   Number of children: Not on file   Years of education: Not on file  Highest education level: Not on file  Occupational History   Occupation: Disabled  Tobacco Use   Smoking status: Every Day    Packs/day: 0.50    Years: 33.00    Pack years: 16.50    Types: Cigarettes, E-cigarettes   Smokeless tobacco: Never  Vaping Use   Vaping Use: Never used  Substance and Sexual Activity   Alcohol use: No    Comment: Sober since 2017   Drug use: No   Sexual activity: Yes    Partners: Female  Other Topics Concern   Not on file  Social History Narrative   Not on file   Social Determinants of Health   Financial Resource Strain: Not on file  Food Insecurity: Not on file  Transportation Needs: Not on file  Physical Activity: Not on file  Stress: Not on file   Social Connections: Not on file   Allergies  Allergen Reactions   Cymbalta [Duloxetine Hcl] Other (See Comments)    Headaches   Family History  Problem Relation Age of Onset   Lung cancer Father      Current Outpatient Medications (Cardiovascular):    carvedilol (COREG) 6.25 MG tablet, TAKE 1 TABLET BY MOUTH TWICE DAILY   rosuvastatin (CRESTOR) 10 MG tablet, TAKE 1 TABLET (10 MG TOTAL) BY MOUTH DAILY.  Current Outpatient Medications (Respiratory):    Budeson-Glycopyrrol-Formoterol (BREZTRI AEROSPHERE) 160-9-4.8 MCG/ACT AERO, inhale 2 puffs into the lungs in the morning and at bedtime   Current Outpatient Medications (Hematological):    cilostazol (PLETAL) 50 MG tablet, TAKE 1 TABLET BY MOUTH TWICE DAILY   enoxaparin (LOVENOX) 120 MG/0.8ML injection, INJECT 120 MG (1 SYRINGE) INTO THE SKIN DAILY FOR 7 DOSES. (Patient not taking: Reported on 10/12/2020)   warfarin (COUMADIN) 6 MG tablet, TAKE 1 TABLET BY MOUTH ONCE DAILY OR AS DIRECTED BY ANTICOAGULATION CLINIC  Current Outpatient Medications (Other):    cyclobenzaprine (FLEXERIL) 10 MG tablet, TAKE 1 TABLET BY MOUTH AT BEDTIME AS NEEDED FOR MUSCLE SPASMS   esomeprazole (NEXIUM) 20 MG capsule, Take 20 mg by mouth daily at 12 noon.   FLUoxetine (PROZAC) 20 MG tablet, TAKE 1 TABLET BY MOUTH ONCE DAILY   gabapentin (NEURONTIN) 600 MG tablet, Take 1 tablet (600 mg total) by mouth 3 (three) times daily.   PFIZER-BIONTECH COVID-19 VACC 30 MCG/0.3ML injection,     Review of Systems:  No headache, visual changes, nausea, vomiting, diarrhea, constipation, dizziness, abdominal pain, skin rash, fevers, chills, night sweats, weight loss, swollen lymph nodes,, joint swelling, chest pain, shortness of breath, mood changes. POSITIVE muscle aches, body aches  Objective  Blood pressure 118/70, pulse 85, height 5\' 9"  (1.753 m), weight 158 lb (71.7 kg), SpO2 96 %.   General: No apparent distress alert and oriented x3 mood and affect normal,  dressed appropriately.  HEENT: Pupils equal, extraocular movements intact  Respiratory: Patient's speak in full sentences and does not appear short of breath  Cardiovascular: No lower extremity edema, non tender, no erythema  Gait normal with good balance and coordination.  MSK: Low back exam does have loss of lordosis.  Patient does have very mild increase in tightness of the hamstring on the right compared to the left.  Patient is sore in the paraspinal musculature of the lumbar spine.  Tightness with FABER test minorly.  Seems to be neurovascular intact distally.  Worsening pain with extension.    Impression and Recommendations:     The above documentation has been reviewed and is accurate and complete  Lyndal Pulley, DO

## 2021-02-15 ENCOUNTER — Ambulatory Visit (INDEPENDENT_AMBULATORY_CARE_PROVIDER_SITE_OTHER)
Admission: RE | Admit: 2021-02-15 | Discharge: 2021-02-15 | Disposition: A | Discharge status: Home / Self Care | Payer: 59 | Source: Ambulatory Visit | Attending: Vascular Surgery | Admitting: Vascular Surgery

## 2021-02-15 ENCOUNTER — Other Ambulatory Visit: Payer: Self-pay

## 2021-02-15 ENCOUNTER — Ambulatory Visit (INDEPENDENT_AMBULATORY_CARE_PROVIDER_SITE_OTHER): Payer: 59 | Admitting: Physician Assistant

## 2021-02-15 ENCOUNTER — Ambulatory Visit (HOSPITAL_COMMUNITY)
Admission: RE | Admit: 2021-02-15 | Discharge: 2021-02-15 | Disposition: A | Discharge status: Home / Self Care | Payer: 59 | Source: Ambulatory Visit | Attending: Vascular Surgery | Admitting: Vascular Surgery

## 2021-02-15 ENCOUNTER — Other Ambulatory Visit: Payer: Self-pay | Admitting: Family Medicine

## 2021-02-15 ENCOUNTER — Ambulatory Visit (INDEPENDENT_AMBULATORY_CARE_PROVIDER_SITE_OTHER): Payer: 59 | Admitting: Family Medicine

## 2021-02-15 VITALS — BP 121/78 | HR 76 | Temp 98.1°F | Resp 20 | Ht 69.0 in | Wt 174.9 lb

## 2021-02-15 VITALS — BP 118/70 | HR 85 | Ht 69.0 in | Wt 158.0 lb

## 2021-02-15 DIAGNOSIS — I779 Disorder of arteries and arterioles, unspecified: Secondary | ICD-10-CM | POA: Diagnosis not present

## 2021-02-15 DIAGNOSIS — M5441 Lumbago with sciatica, right side: Secondary | ICD-10-CM

## 2021-02-15 DIAGNOSIS — G8929 Other chronic pain: Secondary | ICD-10-CM

## 2021-02-15 NOTE — Patient Instructions (Signed)
Epidural ordered.  

## 2021-02-15 NOTE — Assessment & Plan Note (Signed)
Patient does have the progression of the L4-L5 broad-based disc that I do think is continuing to give him some difficulty.  Patient is having increasing and tightness of the musculature including the psoas muscle and iliacus patient will likely will get a another epidural.  Secondary to the blood thinner we will have to do the bridging which she has done previously.  Patient is traveling in the next month and would like to potentially not have the pain.  Discussed the possibility of needing prednisone for this.  If necessary as well but that is in 6 to 8 weeks.  But see how patient responds to the next injection that did last greater than 6 months.

## 2021-02-15 NOTE — Progress Notes (Signed)
Office Note     CC:  follow up Requesting Provider:  Leamon Arnt, MD  HPI: [name] is a 55 y.o. ([birth year]) male who presents for routine follow up of peripheral artery disease.  He has history of right common iliac angioplasty and stenting by Dr. Bridgett Larsson in 2017.  It was for lifestyle limiting short distance claudication. He later required thrombectomy of the right common iliac artery as well as right common femoral artery patch angioplasty 2018. We have been following some stenosis in the right distal common iliac artery stent and just distal to the stent since December of 2020.  He was seen 4 months ago with decrease in his right and left toe pressures.  He presents today to follow-up right lower extremity duplex and repeat ABIs.   He reports that he does have continued back pain in mid back that does radiate to right hip and posterior right thigh. It is a pressure and nerve like shooting pain. This is worse when lying flat but does also occur on ambulation. Improved with sitting and bending over. He attributes this to his back. He recently was seen by ortho and is scheduled to have another epidural in the next couple weeks. He says that previously this relieved all of his symptoms. He denies any pain in calf or foot. He does not have any left lower extremity symptoms. He has no non healing wounds. He remains on his statin, Pletal and coumadin. He does continue to smoke 1/2 ppd  The pt is on a statin for cholesterol management.  The pt is not on a daily aspirin.   Other AC:  Coumadin/ Pletal The pt is on BB for hypertension.   The pt is not diabetic.   Tobacco hx:  current smoker  Past Medical History:  Diagnosis Date   Anticoagulated on Coumadin    Antiphospholipid antibody positive, followed by Dr. Irene Limbo, Hematology 06/27/2016   Anxiety with depression 06/24/2014   Chronic back pain    ED (erectile dysfunction)    GERD (gastroesophageal reflux disease)    Hemorrhoids, internal,  with bleeding    Hiatal hernia    History of adenomatous polyp of colon, 07/08/16, tubular adenomas    History of atrial fibrillation, episode 08/2016    History of cardiomyopathy ([place]) 08/04/2018   ECHO 12/02/18  1. The left ventricle has normal systolic function with an ejection fraction of 60-65%. The cavity size was normal. Left ventricular diastolic parameters were normal.  2. The right ventricle has normal systolic function. The cavity was normal. There is no increase in right ventricular wall thickness.  3. The mitral valve is grossly normal.  4. The aortic valve is tricuspid. Mild thic   History of DVT of lower extremity    Hyperlipidemia    Iron deficiency anemia due to chronic blood loss, thought to be from hemorrhoids while on coumadin, s/p iron infusion and blood transfusion x 2, 2018 2018   Low testosterone in male 05/04/2017   2018, 2019: Decision would be risk vs benefit. Could increase thrombotic risk especially if he has a polycythemic response.  If he decided to pursue this with an understanding of the risk - would use lowest dose of testosterone to maintain level low normal 300-400.  2020: Secondary polycythemia. Not a candidate.   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[facility]), sober since 04/2016    S/P insertion of iliac artery stent 2018   02-23-2017  right CIA PTA and stent/05-11-2016 in-stent restenosis s/p thrombectomy/2018 thrombectomy and  patch angioplasty right femoral artery     Past Surgical History:  Procedure Laterality Date   ANGIOPLASTY ILLIAC ARTERY Right 07/17/2016   Procedure: ANGIOPLASTY RIGHT COMMON ILIAC ARTERY;  Surgeon: Conrad [place], MD;   Location: [place];  Service: Vascular;  Laterality: Right;   ANTERIOR CERVICAL DECOMP/DISCECTOMY FUSION  07/21/2010   C5 -- C7   CARDIOVASCULAR STRESS TEST  06-14-2016   dr hilty   normal perfusion study w/ no reversible ischemia/  stress ef 44% but visually looks better , echo ordered (LVEF 30-44%)  , normal LV wall motion    COLONOSCOPY N/A 07/08/2016   Procedure: COLONOSCOPY;  Surgeon: Clarene Essex, MD;  Location: [facility];  Service: Endoscopy;  Laterality: N/A;   ESOPHAGOGASTRODUODENOSCOPY N/A 07/08/2016   Procedure: ESOPHAGOGASTRODUODENOSCOPY (EGD);  Surgeon: Clarene Essex, MD;  Location: [facility];  Service: Endoscopy;  Laterality: N/A;   GROIN DEBRIDEMENT Right 07/25/2016   Procedure: EVACUATION HEMATOMA RIGHT GROIN;  Surgeon: Waynetta Sandy, MD;  Location: [place];  Service: Vascular;  Laterality: Right;   HEMORRHOID SURGERY N/A 12/07/2016   Procedure: 3 COLUMN HEMORRHOIDECTOMY;  Surgeon: Leighton Ruff, MD;  Location: [facility];  Service: General;  Laterality: N/A;   INTRAOPERATIVE ARTERIOGRAM Right 07/17/2016   Procedure: INTRA OPERATIVE ANGIOGRAM OF RIGHT COMMON ILIAC ARTERY;  Surgeon: Conrad Whitehall, MD;  Location: [place];  Service: Vascular;  Laterality: Right;   KNEE SURGERY Right 1983   cartilage   PATCH ANGIOPLASTY Right 07/17/2016   Procedure: PATCH ANGIOPLASTY RIGHT FEMORAL ARTERY USING Rueben Bash BIOLOGIC PATCH;  Surgeon: Conrad Catasauqua, MD;  Location: [place];  Service: Vascular;  Laterality: Right;   PERIPHERAL VASCULAR CATHETERIZATION N/A 02/24/2016   Procedure: Abdominal Aortogram w/Lower Extremity;  Surgeon: Conrad Augusta, MD;  Location: [place] [facility];  Service: Cardiovascular;  Laterality: N/A;   PERIPHERAL VASCULAR CATHETERIZATION Right 02/24/2016   Procedure: Peripheral Vascular Intervention;  Surgeon: Conrad Prentiss, MD;  Location: [place] [facility];  Service: Cardiovascular;  Laterality: Right;  Common iliac   PERIPHERAL VASCULAR CATHETERIZATION N/A  05/11/2016   Procedure: Abdominal Aortogram;  Surgeon: Conrad Grange[place], MD;  Location: [place] [facility];  Service: Cardiovascular;  Laterality: N/A;   PERIPHERAL VASCULAR CATHETERIZATION N/A 05/11/2016   Procedure: Lower Extremity Angiography;  Surgeon: Conrad Ewing, MD;  Location: [place] [facility];  Service: Cardiovascular;  Laterality: N/A;   POSTERIOR CERVICAL FUSION/FORAMINOTOMY N/A 07/05/2018   Procedure: Posterior cervical fusion with lateral mass fixation - Cervical five-Cervical six, Cervical six-Cervical seven;  Surgeon: Earnie Larsson, MD;  Location: [place];  Service: Neurosurgery;  Laterality: N/A;   THROMBECTOMY FEMORAL ARTERY Right 07/17/2016   Procedure: THROMBECTOMY RIGHT FEMORAL ARTERY;  Surgeon: Conrad Val Verde, MD;  Location: [place];  Service: Vascular;  Laterality: Right;   TRANSTHORACIC ECHOCARDIOGRAM  06-20-2016   dr hilty   EF 55-60%,  grade 1 diastolic dysfunction, mild TR    Social History   Socioeconomic History   Marital status: Married    Spouse name: Almyra Free   Number of children: Not on file   Years of education: Not on file   Highest education level:  Not on file  Occupational History   Occupation: Disabled  Tobacco Use   Smoking status: Every Day    Packs/day: 0.50    Years: 33.00    Pack years: 16.50    Types: Cigarettes, E-cigarettes   Smokeless tobacco: Never  Vaping Use   Vaping Use: Never used  Substance and Sexual Activity   Alcohol use: No    Comment: Sober since 2017   Drug use: No   Sexual activity: Yes    Partners: Female  Other Topics Concern   Not on file  Social History Narrative   Not on file   Social Determinants of Health   Financial Resource Strain: Not on file  Food Insecurity: Not on file  Transportation Needs: Not on file  Physical Activity: Not on file  Stress: Not on file  Social Connections: Not on file  Intimate Partner Violence: Not on file    Family History  Problem Relation Age of Onset   Lung cancer Father      Current Outpatient Medications  Medication Sig Dispense Refill   Budeson-Glycopyrrol-Formoterol (BREZTRI AEROSPHERE) 160-9-4.8 MCG/ACT AERO inhale 2 puffs into the lungs in the morning and at bedtime 10.7 g 5   carvedilol (COREG) 6.25 MG tablet TAKE 1 TABLET BY MOUTH TWICE DAILY 60 tablet 11   cilostazol (PLETAL) 50 MG tablet TAKE 1 TABLET BY MOUTH TWICE DAILY 180 tablet 3   cyclobenzaprine (FLEXERIL) 10 MG tablet TAKE 1 TABLET BY MOUTH AT BEDTIME AS NEEDED FOR MUSCLE SPASMS 90 tablet 3   enoxaparin (LOVENOX) 120 MG/0.8ML injection INJECT 120 MG (1 SYRINGE) INTO THE SKIN DAILY FOR 7 DOSES. 5.6 mL 0   esomeprazole (NEXIUM) 20 MG capsule Take 20 mg by mouth daily at 12 noon.     FLUoxetine (PROZAC) 20 MG tablet TAKE 1 TABLET BY MOUTH ONCE DAILY 90 tablet 3   gabapentin (NEURONTIN) 600 MG tablet Take 1 tablet (600 mg total) by mouth 3 (three) times daily. 270 tablet 3   PFIZER-BIONTECH COVID-19 VACC 30 MCG/0.3ML injection      warfarin (COUMADIN) 6 MG tablet TAKE 1 TABLET BY MOUTH ONCE DAILY OR AS DIRECTED BY ANTICOAGULATION CLINIC 90 tablet 1   rosuvastatin (CRESTOR) 10 MG tablet TAKE 1 TABLET (10 MG TOTAL) BY MOUTH DAILY. (Patient not taking: Reported on 02/15/2021) 90 tablet 3   No current facility-administered medications for this visit.    Allergies  Allergen Reactions   Cymbalta [Duloxetine Hcl] Other (See Comments)    Headaches     REVIEW OF SYSTEMS:  [X]  denotes positive finding, [ ]  denotes negative finding Cardiac  Comments:  Chest pain or chest pressure:    Shortness of breath upon exertion:    Short of breath when lying flat:    Irregular heart rhythm:        Vascular    Pain in calf, thigh, or hip brought on by ambulation:    Pain in feet at night that wakes you up from your sleep:     Blood clot in your veins:    Leg swelling:         Pulmonary    Oxygen at home:    Productive cough:     Wheezing:         Neurologic    Sudden weakness in arms or legs:      Sudden numbness in arms or legs:     Sudden onset of difficulty speaking or slurred speech:    Temporary loss of  vision in one eye:     Problems with dizziness:         Gastrointestinal    Blood in stool:     Vomited blood:         Genitourinary    Burning when urinating:     Blood in urine:        Psychiatric    Major depression:         Hematologic    Bleeding problems:    Problems with blood clotting too easily:        Skin    Rashes or ulcers:        Constitutional    Fever or chills:      PHYSICAL EXAMINATION:  Vitals:   02/15/21 1000  BP: 121/78  Pulse: 76  Resp: 20  Temp: 98.1 F (36.7 C)  TempSrc: Temporal  SpO2: 100%  Weight: 174 lb 14.4 oz (79.3 kg)  Height: 5\' 9"  (1.753 m)    General:  WDWN in NAD; vital signs documented above Gait: Normal HENT: WNL, normocephalic Pulmonary: normal non-labored breathing , without wheezing Cardiac: regular HR, without  Murmurs without carotid bruit Abdomen: soft, NT, no masses Vascular Exam/Pulses:  Right Left  Radial 2+ (normal) 2+ (normal)  Femoral 2+ (normal) 2+ (normal)  Popliteal Not palpable Not palpable  DP Not palpable 2+ (normal)  PT Not palpable 1+ (weak)   Extremities: without ischemic changes, without Gangrene , without cellulitis; without open wounds; feet warm and well perfused Musculoskeletal: no muscle wasting or atrophy  Neurologic: A&O X 3;  No focal weakness or paresthesias are detected Psychiatric:  The pt has Normal affect.   Non-Invasive Vascular Imaging:  02/15/21 +-------+-----------+-----------+------------+------------+  ABI/TBIToday's ABIToday's TBIPrevious ABIPrevious TBI  +-------+-----------+-----------+------------+------------+  Right  1.01       absent     1.06        absent        +-------+-----------+-----------+------------+------------+  Left   1.07       0.74       1.04        0.87          +-------+-----------+-----------+------------+------------+    Abdominal Aorta Findings:  +--------+-------+----------+----------+---------+--------+--------+  LocationAP (cm)Trans (cm)PSV (cm/s)Waveform ThrombusComments  +--------+-------+----------+----------+---------+--------+--------+  Mid                      46        triphasic                  +--------+-------+----------+----------+---------+--------+--------+  Distal                   95        triphasic                  +--------+-------+----------+----------+---------+--------+--------+   Right Stent(s):  +---------------+--------+--------+---------+--------+  CIA            PSV cm/sStenosisWaveform Comments  +---------------+--------+--------+---------+--------+  Prox to Stent  105             triphasic          +---------------+--------+--------+---------+--------+  Proximal Stent 115             triphasic          +---------------+--------+--------+---------+--------+  Mid Stent      203             triphasic          +---------------+--------+--------+---------+--------+  Distal Stent   216  triphasic          +---------------+--------+--------+---------+--------+  Distal to Stent193             triphasic          +---------------+--------+--------+---------+--------+   258 cm/s terminal stent velocity   Summary:  Stenosis: +------------------+---------------+--------------+  Location          Stent          Comments        +------------------+---------------+--------------+  Right Common Iliac50-99% stenosisterminal stent  +------------------+---------------+--------------+   Stable distal stent velocities when compared to exam on 08/10/2020.   ASSESSMENT/PLAN:: 55 y.o. male here for follow up for peripheral artery disease. He has history of right common iliac angioplasty and stenting by Dr. Bridgett Larsson in 2017.  It was for lifestyle limiting short distance claudication. He later required thrombectomy  of the right common iliac artery as well as right common femoral artery patch angioplasty 2018. He has been evaluated more closely due to some recurrent stenosis on duplex in his right common iliac artery stent. He has not had a change in his lower extremity symptoms however he does have some back and buttock pain that could be claudication but hard to differentiate. He is scheduled for epidural and discussed with him that if he has no improvement following he should follow up sooner with [place]. He presently is without rest pain or tissue loss. His duplex today is essentially stable as well as his ABIs. He will continue his statin, pletal and coumadin. I will have him return in 6 months with repeat right iliac artery duplex and ABIs. He understands that if he has worsening or new symptoms to follow up earlier.   Karoline Caldwell, PA-C [facility] [phone]  Clinic MD:   Roxanne Mins

## 2021-02-17 ENCOUNTER — Other Ambulatory Visit: Payer: Self-pay

## 2021-02-17 DIAGNOSIS — I779 Disorder of arteries and arterioles, unspecified: Secondary | ICD-10-CM

## 2021-02-21 ENCOUNTER — Other Ambulatory Visit (HOSPITAL_COMMUNITY): Payer: Self-pay

## 2021-02-21 ENCOUNTER — Ambulatory Visit: Payer: 59 | Admitting: Family Medicine

## 2021-02-21 ENCOUNTER — Encounter: Payer: Self-pay | Admitting: Family Medicine

## 2021-02-21 ENCOUNTER — Other Ambulatory Visit: Payer: Self-pay

## 2021-02-21 ENCOUNTER — Encounter: Payer: Self-pay | Admitting: Hematology

## 2021-02-21 ENCOUNTER — Ambulatory Visit (INDEPENDENT_AMBULATORY_CARE_PROVIDER_SITE_OTHER): Payer: 59 | Admitting: Family Medicine

## 2021-02-21 VITALS — BP 118/78 | HR 85 | Temp 97.6°F | Ht 69.0 in | Wt 175.0 lb

## 2021-02-21 DIAGNOSIS — M5441 Lumbago with sciatica, right side: Secondary | ICD-10-CM | POA: Diagnosis not present

## 2021-02-21 DIAGNOSIS — G8929 Other chronic pain: Secondary | ICD-10-CM | POA: Diagnosis not present

## 2021-02-21 DIAGNOSIS — D751 Secondary polycythemia: Secondary | ICD-10-CM

## 2021-02-21 DIAGNOSIS — Z23 Encounter for immunization: Secondary | ICD-10-CM

## 2021-02-21 DIAGNOSIS — S129XXS Fracture of neck, unspecified, sequela: Secondary | ICD-10-CM

## 2021-02-21 DIAGNOSIS — E782 Mixed hyperlipidemia: Secondary | ICD-10-CM

## 2021-02-21 DIAGNOSIS — D729 Disorder of white blood cells, unspecified: Secondary | ICD-10-CM

## 2021-02-21 DIAGNOSIS — I779 Disorder of arteries and arterioles, unspecified: Secondary | ICD-10-CM

## 2021-02-21 DIAGNOSIS — F339 Major depressive disorder, recurrent, unspecified: Secondary | ICD-10-CM

## 2021-02-21 DIAGNOSIS — Z7901 Long term (current) use of anticoagulants: Secondary | ICD-10-CM

## 2021-02-21 DIAGNOSIS — J439 Emphysema, unspecified: Secondary | ICD-10-CM

## 2021-02-21 LAB — COMPREHENSIVE METABOLIC PANEL
ALT: 16 U/L (ref 0–53)
AST: 16 U/L (ref 0–37)
Albumin: 4.2 g/dL (ref 3.5–5.2)
Alkaline Phosphatase: 98 U/L (ref 39–117)
BUN: 7 mg/dL (ref 6–23)
CO2: 26 mEq/L (ref 19–32)
Calcium: 9.4 mg/dL (ref 8.4–10.5)
Chloride: 102 mEq/L (ref 96–112)
Creatinine, Ser: 0.89 mg/dL (ref 0.40–1.50)
GFR: 97.76 mL/min (ref >60.00)
Glucose, Bld: 90 mg/dL (ref 70–99)
Potassium: 4.1 mEq/L (ref 3.5–5.1)
Sodium: 137 mEq/L (ref 135–145)
Total Bilirubin: 1.1 mg/dL (ref 0.2–1.2)
Total Protein: 7.4 g/dL (ref 6.0–8.3)

## 2021-02-21 LAB — LIPID PANEL
Cholesterol: 125 mg/dL (ref 0–200)
HDL: 31.2 mg/dL — ABNORMAL LOW (ref >39.00)
LDL Cholesterol: 64 mg/dL (ref 0–99)
NonHDL: 94.02
Total CHOL/HDL Ratio: 4
Triglycerides: 149 mg/dL (ref 0.0–149.0)
VLDL: 29.8 mg/dL (ref 0.0–40.0)

## 2021-02-21 MED ORDER — TRELEGY ELLIPTA 100-62.5-25 MCG/INH IN AEPB
1.0000 | INHALATION_SPRAY | Freq: Every day | RESPIRATORY_TRACT | 11 refills | Status: DC
  Filled 2021-02-21 (×2): qty 60, 30d supply, fill #0

## 2021-02-21 NOTE — Patient Instructions (Addendum)
Please return in 6 months for your annual complete physical; please come fasting.   I will release your lab results to you on your MyChart account with further instructions. Please reply with any questions.    I have ordered Trelegy Ellipta to see if this helps more with your breathing. Use this in place of the Brextri.  Decrease smoking to help more.   If you have any questions or concerns, please don't hesitate to send me a message via MyChart or call the office at [phone]. Thank you for visiting with [place] today! It's our pleasure caring for you.

## 2021-02-21 NOTE — Progress Notes (Signed)
Subjective  CC:  Chief Complaint  Patient presents with   Gastroesophageal Reflux   Depression   Back Pain    Has improved at the moment.    HPI: [name] is a 55 y.o. male who presents to the office today to address the problems listed above in the chief complaint. F/u chronic problems: Back pain: for epidural steroid injection soon.  PAD: stable on meds. Reviewed recent vascular evaluation Smoker: 1ppd. Admits to increase DOE and wheezing. On Brextri and it helps but still with sxs. No sxs of infection HLD: tolerating crestro 10 nightly, increased dose from march to get ldl < 70. Due for recheck. Non fasting GERD and mood remain well controlled on nexium and prozac.  Polycythemia: following with hematology. Stable.  HM: due flu and shingrix.   Assessment  1. Mixed hyperlipidemia   2. Pulmonary emphysema, unspecified emphysema type ([place])   3. PAOD (peripheral arterrial occlusion disease) ([place])   4. Pseudoarthrosis of cervical spine, sequela   5. History of neutrophilia   6. Chronic right-sided low back pain with right-sided sciatica   7. Long term (current) use of anticoagulants [Z79.01]   8. Polycythemia   9. Major depression, recurrent, chronic ([place])   10. Need for immunization against influenza      Plan  HLD:  recheck lipids and lfts on crestor 10 Copd: worsening clinically. Educated on need for smoking cessation. Remains precontemplative. Change to trelegy once daily. Albuterol prn.  Chronic pain per ortho/ns and vascular.  GERD and mood remain controlled.  Flu shot today; pt has RX for shingrix.   Follow up: 4mo for cpe  Visit date not found  Orders Placed This Encounter  Procedures   Flu Vaccine QUAD 74mo+IM (Fluarix, Fluzone & Alfiuria Quad PF)   Comprehensive metabolic panel   Lipid panel   Meds ordered this encounter  Medications   Fluticasone-Umeclidin-Vilant (TRELEGY ELLIPTA) 100-62.5-25 MCG/INH AEPB    Sig: Inhale 1 puff into the lungs daily.     Dispense:  60 each    Refill:  11      I reviewed the patients updated PMH, FH, and SocHx.    Patient Active Problem List   Diagnosis Date Noted   Polycythemia 02/02/2019    Priority: 1.   Pulmonary emphysema ([place]) 08/04/2018    Priority: 1.   Chronic pain disorder 08/04/2018    Priority: 1.   Chronic right-sided low back pain with right-sided sciatica 11/19/2016    Priority: 1.   Long term (current) use of anticoagulants [Z79.01] 09/15/2016    Priority: 1.   Mixed hyperlipidemia 06/27/2016    Priority: 1.   Antiphospholipid antibody positive, requires lifetime Coumadin 06/27/2016    Priority: 1.   PAOD (peripheral arterrial occlusion disease) ([place]) 06/02/2016    Priority: 1.   Nicotine dependence 06/24/2014    Priority: 1.   Major depression, recurrent, chronic ([place]) 02/02/2019    Priority: 2.   Cervical pseudoarthrosis [facility] 07/05/2018    Priority: 2.   History of alcohol abuse, sober since 04/2016 07/11/2016    Priority: 2.   Gastroesophageal reflux disease, with small hiatal hernia, Rx Nexium     Priority: 2.   Frequent PVCs 02/02/2019    Priority: 3.   Degenerative arthritis of right knee 09/06/2017    Priority: 3.   History of neutrophilia 06/18/2017    Priority: 3.   Erectile dysfunction 11/19/2016    Priority: 3.   Hx of iron deficiency anemia due to  chronic blood loss, hemorrhoids while on coumadin, s/p hemorrhoidectomy 11/2016     Priority: 3.   Anxiety disorder 02/02/2019   Arthritis of left acromioclavicular joint 09/29/2016   Current Meds  Medication Sig   carvedilol (COREG) 6.25 MG tablet TAKE 1 TABLET BY MOUTH TWICE DAILY   cilostazol (PLETAL) 50 MG tablet TAKE 1 TABLET BY MOUTH TWICE DAILY   cyclobenzaprine (FLEXERIL) 10 MG tablet TAKE 1 TABLET BY MOUTH AT BEDTIME AS NEEDED FOR MUSCLE SPASMS   enoxaparin (LOVENOX) 120 MG/0.8ML injection INJECT 120 MG (1 SYRINGE) INTO THE SKIN DAILY FOR 7 DOSES.   esomeprazole (NEXIUM) 20 MG capsule Take 20 mg by  mouth daily at 12 noon.   FLUoxetine (PROZAC) 20 MG tablet TAKE 1 TABLET BY MOUTH ONCE DAILY   Fluticasone-Umeclidin-Vilant (TRELEGY ELLIPTA) 100-62.5-25 MCG/INH AEPB Inhale 1 puff into the lungs daily.   gabapentin (NEURONTIN) 600 MG tablet Take 1 tablet (600 mg total) by mouth 3 (three) times daily.   PFIZER-BIONTECH COVID-19 VACC 30 MCG/0.3ML injection    rosuvastatin (CRESTOR) 10 MG tablet TAKE 1 TABLET (10 MG TOTAL) BY MOUTH DAILY.   warfarin (COUMADIN) 6 MG tablet TAKE 1 TABLET BY MOUTH ONCE DAILY OR AS DIRECTED BY ANTICOAGULATION CLINIC   [DISCONTINUED] Budeson-Glycopyrrol-Formoterol (BREZTRI AEROSPHERE) 160-9-4.8 MCG/ACT AERO inhale 2 puffs into the lungs in the morning and at bedtime    Allergies: Patient is allergic to cymbalta [duloxetine hcl]. Family History: Patient family history includes Lung cancer in his father. Social History:  Patient  reports that he has been smoking cigarettes and e-cigarettes. He has a 16.50 pack-year smoking history. He has never used smokeless tobacco. He reports that he does not drink alcohol and does not use drugs.  Review of Systems: Constitutional: Negative for fever malaise or anorexia Cardiovascular: negative for chest pain Respiratory: negative for SOB or persistent cough Gastrointestinal: negative for abdominal pain  Objective  Vitals: BP 118/78   Pulse 85   Temp 97.6 F (36.4 C)   Ht 5\' 9"  (1.753 m)   Wt 175 lb (79.4 kg)   SpO2 97%   BMI 25.84 kg/m  General: no acute distress , A&Ox3 HEENT: PEERL, conjunctiva normal, neck is supple Cardiovascular:  RRR without murmur or gallop.  Respiratory:  Good breath sounds bilaterally, CTAB with normal respiratory effort Skin:  Warm, no rashes    Commons side effects, risks, benefits, and alternatives for medications and treatment plan prescribed today were discussed, and the patient expressed understanding of the given instructions. Patient is instructed to call or message via MyChart if  he/she has any questions or concerns regarding our treatment plan. No barriers to understanding were identified. We discussed Red Flag symptoms and signs in detail. Patient expressed understanding regarding what to do in case of urgent or emergency type symptoms.  Medication list was reconciled, printed and provided to the patient in AVS. Patient instructions and summary information was reviewed with the patient as documented in the AVS. This note was prepared with assistance of Dragon voice recognition software. Occasional wrong-word or sound-a-like substitutions may have occurred due to the inherent limitations of voice recognition software  This visit occurred during the SARS-CoV-2 public health emergency.  Safety protocols were in place, including screening questions prior to the visit, additional usage of staff PPE, and extensive cleaning of exam room while observing appropriate contact time as indicated for disinfecting solutions.

## 2021-02-22 ENCOUNTER — Ambulatory Visit (INDEPENDENT_AMBULATORY_CARE_PROVIDER_SITE_OTHER): Payer: 59

## 2021-02-22 ENCOUNTER — Telehealth: Payer: Self-pay

## 2021-02-22 DIAGNOSIS — Z7901 Long term (current) use of anticoagulants: Secondary | ICD-10-CM

## 2021-02-22 LAB — POCT INR: INR: 2.9 (ref 2.0–3.0)

## 2021-02-22 NOTE — Telephone Encounter (Addendum)
Pt called to report INR of 2.9 today and also that he will have an epidural on 10/14 and they are requesting he hold his coumadin and bridge with lovenox.   Pt wt 79.4kg CrCl 107.21mL/min Lovenox 120mg  QD, need 7 syringes, pt pharmacy is [facility]   Coumadin dosing and lovenox bridge schedule  10/9: Take last dose of coumadin 10/10: No coumadin, no lovenox 10/11: No coumadin, lovenox AM 10/12: No coumadin, lovenox AM 10/13: No coumadin, lovenox AM( before 7AM)  10/14: Procedure, Epidural; NO COUMADIN, NO LOVENOX  10/15: Take 1 1/2 tablets (9mg ) coumadin, lovenox AM 10/16: Take 1 1/2 tablets (9mg ) coumadin, lovenox AM 9/17: Take 1 1/2 tablets (9mg ) coumadin, lovenox AM 9/18: Take 1 1/2 tablets (9mg ) coumadin, lovenox AM 9/19: Take 1 tablet (6mg ) coumadin, no lovenox 9/20: Recheck INR at home and call in result in the AM

## 2021-02-22 NOTE — Patient Instructions (Addendum)
Pre visit review using our clinic review tool, if applicable. No additional management support is needed unless otherwise documented below in the visit note.  Continue 6mg  daily and will update with coumadin dosing and lovenox bridge this week for procedure on 10/14.

## 2021-02-23 ENCOUNTER — Other Ambulatory Visit (HOSPITAL_COMMUNITY): Payer: Self-pay

## 2021-02-23 MED ORDER — ENOXAPARIN SODIUM 120 MG/0.8ML IJ SOSY
120.0000 mg | PREFILLED_SYRINGE | INTRAMUSCULAR | 0 refills | Status: DC
Start: 2021-02-23 — End: 2021-05-25
  Filled 2021-02-23: qty 5.6, 7d supply, fill #0

## 2021-02-23 NOTE — Telephone Encounter (Signed)
Per Dr. Jonni Sanger, coumadin/lovenox bridge ok to proceed via Teams msg.  Ok to send in lovenox.  Contacted pt and advised of instructions. Pt requested instructions sent via mychart. Sent instructions and verbal instructions. Advised if any questions to contact office. Pt verbalized understanding.  Pt will test INR the day before procedure per procedure provider. Pt will call INR result to this nurse on 10/13 to report.   Sent in script for lovenox.

## 2021-02-24 ENCOUNTER — Other Ambulatory Visit (HOSPITAL_COMMUNITY): Payer: Self-pay

## 2021-03-03 ENCOUNTER — Telehealth: Payer: Self-pay

## 2021-03-03 LAB — POCT INR: INR: 1.3 — AB (ref <=1.1)

## 2021-03-03 NOTE — Telephone Encounter (Signed)
Received call from Christy Sartorius at [facility] INR monitoring reporting a critical INR result of 1.3, had Christy Sartorius repeat value and again he said 1.3. Checked on [employer] and the value is 1.3.   Pt is scheduled for an epidural tomorrow and has been holding his coumadin and on a lovenox bridge. This would be a correct reading for him holding coumadin for procedure. Dr. Tamala Julian requested pt check INR one day before procedure.  No changes in dosing to couamdin or lovenox have been made. Pt will proceed with procedure tomorrow.

## 2021-03-04 ENCOUNTER — Other Ambulatory Visit: Payer: Self-pay

## 2021-03-04 ENCOUNTER — Ambulatory Visit
Admission: RE | Admit: 2021-03-04 | Discharge: 2021-03-04 | Disposition: A | Discharge status: Home / Self Care | Payer: 59 | Source: Ambulatory Visit | Attending: Family Medicine | Admitting: Family Medicine

## 2021-03-04 DIAGNOSIS — G8929 Other chronic pain: Secondary | ICD-10-CM

## 2021-03-04 DIAGNOSIS — M47817 Spondylosis without myelopathy or radiculopathy, lumbosacral region: Secondary | ICD-10-CM | POA: Diagnosis not present

## 2021-03-04 MED ORDER — IOPAMIDOL (ISOVUE-M 200) INJECTION 41%
1.0000 mL | Freq: Once | INTRAMUSCULAR | Status: AC
  Administered 2021-03-04: 1 mL via EPIDURAL

## 2021-03-04 MED ORDER — METHYLPREDNISOLONE ACETATE 40 MG/ML INJ SUSP (RADIOLOG
80.0000 mg | Freq: Once | INTRAMUSCULAR | Status: AC
  Administered 2021-03-04: 80 mg via EPIDURAL

## 2021-03-04 NOTE — Discharge Instructions (Signed)
Post Procedure Spinal Discharge Instruction Sheet  You may resume a regular diet and any medications that you routinely take (including pain medications) unless otherwise noted by MD.  No driving day of procedure.  Light activity throughout the rest of the day.  Do not do any strenuous work, exercise, bending or lifting.  The day following the procedure, you can resume normal physical activity but you should refrain from exercising or physical therapy for at least three days thereafter.  You may apply ice to the injection site, 20 minutes on, 20 minutes off, as needed. Do not apply ice directly to skin.    Common Side Effects:  Headaches- take your usual medications as directed by your physician.  Increase your fluid intake.  Caffeinated beverages may be helpful.  Lie flat in bed until your headache resolves.  Restlessness or inability to sleep- you may have trouble sleeping for the next few days.  Ask your referring physician if you need any medication for sleep.  Facial flushing or redness- should subside within a few days.  Increased pain- a temporary increase in pain a day or two following your procedure is not unusual.  Take your pain medication as prescribed by your referring physician.  Leg cramps  Please contact our office at [phone] for the following symptoms: Fever greater than 100 degrees. Headaches unresolved with medication after 2-3 days. Increased swelling, pain, or redness at injection site.   Thank you for visiting [facility] today.    YOU MAY RESUME YOUR COUMADIN 12 HRS AFTER INJECTION AND YOU MAY RESUME YOUR PLETAL 4 HOURS AFTER INJECTION

## 2021-03-07 ENCOUNTER — Other Ambulatory Visit (HOSPITAL_COMMUNITY): Payer: Self-pay

## 2021-03-10 ENCOUNTER — Ambulatory Visit (INDEPENDENT_AMBULATORY_CARE_PROVIDER_SITE_OTHER): Payer: 59

## 2021-03-10 DIAGNOSIS — Z7901 Long term (current) use of anticoagulants: Secondary | ICD-10-CM

## 2021-03-10 LAB — POCT INR
INR: 2.9 (ref 2.0–3.0)
INR: 2.9 (ref 2.0–3.0)

## 2021-03-10 NOTE — Patient Instructions (Addendum)
Pre visit review using our clinic review tool, if applicable. No additional management support is needed unless otherwise documented below in the visit note.  Continue 6mg  daily and recheck in 2 wks.

## 2021-03-10 NOTE — Progress Notes (Signed)
Medical screening examination/treatment/procedure(s) were performed by non-physician practitioner and as supervising physician I was immediately available for consultation/collaboration. I agree with above. Sundi Slevin, MD   

## 2021-03-14 ENCOUNTER — Other Ambulatory Visit (HOSPITAL_COMMUNITY): Payer: Self-pay

## 2021-03-14 MED FILL — Rosuvastatin Calcium Tab 10 MG: ORAL | 90 days supply | Qty: 90 | Fill #1 | Status: AC

## 2021-03-23 ENCOUNTER — Ambulatory Visit (INDEPENDENT_AMBULATORY_CARE_PROVIDER_SITE_OTHER): Payer: 59

## 2021-03-23 ENCOUNTER — Telehealth: Payer: Self-pay

## 2021-03-23 DIAGNOSIS — Z7901 Long term (current) use of anticoagulants: Secondary | ICD-10-CM

## 2021-03-23 LAB — POCT INR: INR: 2.9 (ref 2.0–3.0)

## 2021-03-23 NOTE — Patient Instructions (Addendum)
Pre visit review using our clinic review tool, if applicable. No additional management support is needed unless otherwise documented below in the visit note.  Continue 6mg  daily and recheck in 2 wks. Per pt request, LVM with instructions to continue current dosing and recheck in 2 wks.

## 2021-03-23 NOTE — Progress Notes (Signed)
Continue 6mg  daily and recheck in 2 wks. Per pt request, LVM with instructions to continue current dosing and recheck in 2 wks.

## 2021-03-23 NOTE — Telephone Encounter (Signed)
Pt due for INR result which he tests at home. LVM for pt to test and call with result.

## 2021-04-10 MED FILL — Fluoxetine HCl Tab 20 MG: ORAL | 90 days supply | Qty: 90 | Fill #2 | Status: AC

## 2021-04-11 ENCOUNTER — Encounter: Payer: Self-pay | Admitting: Hematology

## 2021-04-11 ENCOUNTER — Other Ambulatory Visit (HOSPITAL_COMMUNITY): Payer: Self-pay

## 2021-04-11 MED ORDER — TRELEGY ELLIPTA 100-62.5-25 MCG/ACT IN AEPB
INHALATION_SPRAY | RESPIRATORY_TRACT | 10 refills | Status: DC
  Filled 2021-04-11: qty 60, 30d supply, fill #0
  Filled 2021-06-12: qty 60, 30d supply, fill #1

## 2021-04-12 ENCOUNTER — Other Ambulatory Visit (HOSPITAL_COMMUNITY): Payer: Self-pay

## 2021-04-17 ENCOUNTER — Other Ambulatory Visit (HOSPITAL_COMMUNITY): Payer: Self-pay

## 2021-04-17 ENCOUNTER — Other Ambulatory Visit: Payer: Self-pay | Admitting: Family Medicine

## 2021-04-17 DIAGNOSIS — D6859 Other primary thrombophilia: Secondary | ICD-10-CM

## 2021-04-18 ENCOUNTER — Ambulatory Visit (INDEPENDENT_AMBULATORY_CARE_PROVIDER_SITE_OTHER): Payer: 59

## 2021-04-18 ENCOUNTER — Other Ambulatory Visit (HOSPITAL_COMMUNITY): Payer: Self-pay

## 2021-04-18 DIAGNOSIS — Z7901 Long term (current) use of anticoagulants: Secondary | ICD-10-CM | POA: Diagnosis not present

## 2021-04-18 LAB — POCT INR: INR: 3.1 — AB (ref 2.0–3.0)

## 2021-04-18 MED ORDER — WARFARIN SODIUM 6 MG PO TABS
ORAL_TABLET | ORAL | 1 refills | Status: DC
  Filled 2021-04-18: qty 90, 90d supply, fill #0
  Filled 2021-08-02: qty 90, 90d supply, fill #1

## 2021-04-18 NOTE — Patient Instructions (Addendum)
Pre visit review using our clinic review tool, if applicable. No additional management support is needed unless otherwise documented below in the visit note.  Only take 3mg  today and then continue 6mg  daily and recheck in 2 wks.

## 2021-04-18 NOTE — Progress Notes (Signed)
Only take 3mg  today and then continue 6mg  daily and recheck in 2 wks.

## 2021-04-19 ENCOUNTER — Other Ambulatory Visit (HOSPITAL_COMMUNITY): Payer: Self-pay

## 2021-05-01 LAB — POCT INR: INR: 3.9 — AB (ref 2.0–3.0)

## 2021-05-02 ENCOUNTER — Ambulatory Visit (INDEPENDENT_AMBULATORY_CARE_PROVIDER_SITE_OTHER): Payer: 59

## 2021-05-02 DIAGNOSIS — Z7901 Long term (current) use of anticoagulants: Secondary | ICD-10-CM | POA: Diagnosis not present

## 2021-05-02 DIAGNOSIS — R760 Raised antibody titer: Secondary | ICD-10-CM | POA: Diagnosis not present

## 2021-05-02 LAB — POCT INR: INR: 3.9 — AB (ref 2.0–3.0)

## 2021-05-02 NOTE — Patient Instructions (Addendum)
Pre visit review using our clinic review tool, if applicable. No additional management support is needed unless otherwise documented below in the visit note.  Hold dose today and then change weekly dose to take 6 mg daily except take 3 mg on Mondays. Recheck on 12/16

## 2021-05-02 NOTE — Progress Notes (Signed)
Hold dose today and then change weekly dose to take 6 mg daily except take 3 mg on Mondays. Recheck on 12/16

## 2021-05-06 ENCOUNTER — Ambulatory Visit (INDEPENDENT_AMBULATORY_CARE_PROVIDER_SITE_OTHER): Payer: 59

## 2021-05-06 DIAGNOSIS — Z7901 Long term (current) use of anticoagulants: Secondary | ICD-10-CM | POA: Diagnosis not present

## 2021-05-06 LAB — POCT INR: INR: 2.6 (ref 2.0–3.0)

## 2021-05-06 NOTE — Progress Notes (Addendum)
Continue to take 6 mg daily except take 3 mg on Mondays. Recheck on 1/10 due to this nurse being out of the office next week.

## 2021-05-06 NOTE — Patient Instructions (Addendum)
Pre visit review using our clinic review tool, if applicable. No additional management support is needed unless otherwise documented below in the visit note.  Continue to take 6 mg daily except take 3 mg on Mondays. Recheck on 1/10 due to this nurse being out of the office next week.

## 2021-05-09 ENCOUNTER — Other Ambulatory Visit (HOSPITAL_COMMUNITY): Payer: Self-pay

## 2021-05-10 ENCOUNTER — Other Ambulatory Visit (HOSPITAL_COMMUNITY): Payer: Self-pay

## 2021-05-17 ENCOUNTER — Telehealth: Payer: Self-pay | Admitting: Family Medicine

## 2021-05-17 ENCOUNTER — Other Ambulatory Visit: Payer: Self-pay

## 2021-05-17 DIAGNOSIS — M5416 Radiculopathy, lumbar region: Secondary | ICD-10-CM

## 2021-05-17 NOTE — Telephone Encounter (Signed)
Patient called requesting an order for another epidural. Does he need a visit with Tamala Julian as well? He would also like to know how often he should be doing epidurals so that he can stay on track?  Please advise.

## 2021-05-17 NOTE — Telephone Encounter (Signed)
Ordered. Patient notified. 

## 2021-05-19 LAB — POCT INR: INR: 2.5 (ref 2.0–3.0)

## 2021-05-24 ENCOUNTER — Telehealth: Payer: Self-pay

## 2021-05-24 NOTE — Telephone Encounter (Signed)
Pt LVM to report he will be having another epidural on 1/20 and will need lovenox bridge.  Procedure: Epidural CrCl: 107.8 mL/min Weight: 79.4kg  Recommended lovenox dose; 120 mg, QD, need 8 syringes.   1/15: Take last dose of coumadin 1/16: NO coumadin, NO Lovenox 1/17: NO coumadin, Lovenox AM 1/18: No coumadin, Lovenox AM 1/19: NO coumadin, Lovenox AM (before 7am)  1/20: Procedure Day; NO COUMADIN, NO LOVENOX  1/21: Take 9 mg (1 1/2 tablets) coumadin, Lovenox AM 1/22: Take 9 mg (1 1/2 tablets) coumadin, Lovenox AM 1/23: Take 6 mg (1 tablet) coumadin, Lovenox AM 1/24: Take 9 mg (1 1/2 tablets)coumadin, Lovenox AM 1/25: Take 6 mg (1 tablet) coumadin, Lovenox AM 1/26: Recheck INR in the morning, DO NOT TAKE COUMADIN UNTIL  INR CHECK

## 2021-05-25 ENCOUNTER — Other Ambulatory Visit (HOSPITAL_COMMUNITY): Payer: Self-pay

## 2021-05-25 MED ORDER — ENOXAPARIN SODIUM 120 MG/0.8ML IJ SOSY
120.0000 mg | PREFILLED_SYRINGE | INTRAMUSCULAR | 0 refills | Status: DC
  Filled 2021-05-25: qty 6.4, 8d supply, fill #0

## 2021-05-25 NOTE — Telephone Encounter (Signed)
Sent script to [facility] per pt request.

## 2021-05-25 NOTE — Telephone Encounter (Signed)
Pt will test they day before procedure per provider performing epidural. INR check will be pre-procedure,1/19, and post-procedure, 1/26. Pt will call both results to coumadin clinic.  Sent instructions through mychart, which was pt's preference with last instructions. Verifying where pt wants lovenox sent and then I will send it in electronically.

## 2021-05-26 ENCOUNTER — Other Ambulatory Visit (HOSPITAL_COMMUNITY): Payer: Self-pay

## 2021-05-27 ENCOUNTER — Other Ambulatory Visit: Payer: Self-pay

## 2021-05-27 DIAGNOSIS — M2550 Pain in unspecified joint: Secondary | ICD-10-CM

## 2021-05-30 ENCOUNTER — Other Ambulatory Visit (INDEPENDENT_AMBULATORY_CARE_PROVIDER_SITE_OTHER): Payer: 59

## 2021-05-30 DIAGNOSIS — M2550 Pain in unspecified joint: Secondary | ICD-10-CM | POA: Diagnosis not present

## 2021-05-30 LAB — CBC WITH DIFFERENTIAL/PLATELET
Basophils Absolute: 0.1 10*3/uL (ref 0.0–0.1)
Basophils Relative: 0.7 % (ref 0.0–3.0)
Eosinophils Absolute: 0.5 10*3/uL (ref 0.0–0.7)
Eosinophils Relative: 3.5 % (ref 0.0–5.0)
HCT: 46.7 % (ref 39.0–52.0)
Hemoglobin: 15.3 g/dL (ref 13.0–17.0)
Lymphocytes Relative: 20.1 % (ref 12.0–46.0)
Lymphs Abs: 2.7 10*3/uL (ref 0.7–4.0)
MCHC: 32.8 g/dL (ref 30.0–36.0)
MCV: 97.4 fl (ref 78.0–100.0)
Monocytes Absolute: 0.6 10*3/uL (ref 0.1–1.0)
Monocytes Relative: 4.5 % (ref 3.0–12.0)
Neutro Abs: 9.7 10*3/uL — ABNORMAL HIGH (ref 1.4–7.7)
Neutrophils Relative %: 71.2 % (ref 43.0–77.0)
Platelets: 254 10*3/uL (ref 150.0–400.0)
RBC: 4.8 Mil/uL (ref 4.22–5.81)
RDW: 13.6 % (ref 11.5–15.5)
WBC: 13.6 10*3/uL — ABNORMAL HIGH (ref 4.0–10.5)

## 2021-05-30 LAB — FERRITIN: Ferritin: 18.7 ng/mL — ABNORMAL LOW (ref 22.0–322.0)

## 2021-05-30 LAB — IBC PANEL
Iron: 59 ug/dL (ref 42–165)
Saturation Ratios: 13.6 % — ABNORMAL LOW (ref 20.0–50.0)
TIBC: 432.6 ug/dL (ref 250.0–450.0)
Transferrin: 309 mg/dL (ref 212.0–360.0)

## 2021-05-31 ENCOUNTER — Other Ambulatory Visit: Payer: Self-pay

## 2021-05-31 ENCOUNTER — Ambulatory Visit (INDEPENDENT_AMBULATORY_CARE_PROVIDER_SITE_OTHER): Payer: 59

## 2021-05-31 DIAGNOSIS — M2550 Pain in unspecified joint: Secondary | ICD-10-CM

## 2021-05-31 DIAGNOSIS — Z7901 Long term (current) use of anticoagulants: Secondary | ICD-10-CM | POA: Diagnosis not present

## 2021-05-31 LAB — TESTOSTERONE: Testosterone: 308.32 ng/dL (ref 300.00–890.00)

## 2021-05-31 LAB — POCT INR: INR: 3.4 — AB (ref 2.0–3.0)

## 2021-05-31 NOTE — Progress Notes (Signed)
Pt is having an epidural on 1/20 and will be on a lovenox bridge. This dosing has already been given to pt. Pt called with result of 3.4 for INR today. Hold dose today and then change weekly dose to take 6mg  daily except take 3 mg on Mondays and Thursdays. Recheck on 1/26.

## 2021-05-31 NOTE — Progress Notes (Signed)
Patient ID: [name], male   DOB: 1965-10-08, 56 y.o.   MRN: [id number]  Medical screening examination/treatment/procedure(s) were performed by non-physician practitioner and as supervising physician I was immediately available for consultation/collaboration.  I agree with above. Cathlean Cower, MD

## 2021-05-31 NOTE — Patient Instructions (Addendum)
Pre visit review using our clinic review tool, if applicable. No additional management support is needed unless otherwise documented below in the visit note.  Hold dose today and then change weekly dose to take 6mg  daily except take 3 mg on Mondays and Thursdays. Recheck on 1/26.

## 2021-06-01 ENCOUNTER — Other Ambulatory Visit: Payer: Self-pay | Admitting: *Deleted

## 2021-06-01 ENCOUNTER — Other Ambulatory Visit: Payer: Self-pay

## 2021-06-01 ENCOUNTER — Other Ambulatory Visit: Payer: Self-pay | Admitting: Pharmacy Technician

## 2021-06-01 DIAGNOSIS — D500 Iron deficiency anemia secondary to blood loss (chronic): Secondary | ICD-10-CM

## 2021-06-02 ENCOUNTER — Telehealth: Payer: Self-pay | Admitting: Pharmacy Technician

## 2021-06-02 NOTE — Telephone Encounter (Signed)
Dr. Tamala Julian,  Juluis Rainier note:  Auth Submission: no auth needed Payer: [employer] Medication & CPT/J Code(s) submitted: Venofer (Iron Sucrose) J1756 Route of submission (phone, fax, portal): PHONE Auth type: Buy/Bill Units/visits requested: 5 Reference number: [id number] Approval from: 06/02/21 to 08/31/21   Patient will be scheduled as soon as possible.

## 2021-06-05 MED FILL — Rosuvastatin Calcium Tab 10 MG: ORAL | 90 days supply | Qty: 90 | Fill #2 | Status: AC

## 2021-06-06 ENCOUNTER — Other Ambulatory Visit (HOSPITAL_COMMUNITY): Payer: Self-pay

## 2021-06-09 ENCOUNTER — Ambulatory Visit: Payer: Self-pay

## 2021-06-09 LAB — POCT INR: INR: 1.3 — AB (ref 2.0–3.0)

## 2021-06-09 NOTE — Progress Notes (Addendum)
It appears the pts anticoagulation episodes were resolved by someone on 1/11. Unsure why this was done because pt is still taking warfarin and his anticoagulation series should not have been resolved except by the coumadin clinic.   Restarted anticoagulation episodes.  Pt is currently on a lovenox bridge for epidural injection tomorrow. Dosing instructions given to pt on 1/4 are below. Pt reported today he is following bridge instructions.   1/15: Take last dose of coumadin 1/16: NO coumadin, NO Lovenox 1/17: NO coumadin, Lovenox AM 1/18: NO coumadin, Lovenox AM 1/19: NO coumadin, Lovenox AM (before 7am)   1/20: Procedure Day; NO COUMADIN, NO LOVENOX   1/21: Take 9 mg (1 1/2 tablets) coumadin, Lovenox AM 1/22: Take 9 mg (1 1/2 tablets) coumadin, Lovenox AM 1/23: Take 6 mg (1 tablet) coumadin, Lovenox AM 1/24: Take 9 mg (1 1/2 tablets)coumadin, Lovenox AM 1/25: Take 6 mg (1 tablet) coumadin, Lovenox AM 1/26: Recheck INR in the morning, DO NOT TAKE COUMADIN UNTIL  INR CHECK

## 2021-06-09 NOTE — Patient Instructions (Addendum)
Pre visit review using our clinic review tool, if applicable. No additional management support is needed unless otherwise documented below in the visit note.  Continue with instructions given for lovenox bridge. Recheck in 1 week.

## 2021-06-10 ENCOUNTER — Ambulatory Visit
Admission: RE | Admit: 2021-06-10 | Discharge: 2021-06-10 | Disposition: A | Discharge status: Home / Self Care | Payer: 59 | Source: Ambulatory Visit | Attending: Family Medicine | Admitting: Family Medicine

## 2021-06-10 ENCOUNTER — Other Ambulatory Visit: Payer: Self-pay

## 2021-06-10 DIAGNOSIS — M5416 Radiculopathy, lumbar region: Secondary | ICD-10-CM

## 2021-06-10 DIAGNOSIS — M47817 Spondylosis without myelopathy or radiculopathy, lumbosacral region: Secondary | ICD-10-CM | POA: Diagnosis not present

## 2021-06-10 MED ORDER — IOPAMIDOL (ISOVUE-M 200) INJECTION 41%
1.0000 mL | Freq: Once | INTRAMUSCULAR | Status: AC
  Administered 2021-06-10: 1 mL via EPIDURAL

## 2021-06-10 MED ORDER — METHYLPREDNISOLONE ACETATE 40 MG/ML INJ SUSP (RADIOLOG
80.0000 mg | Freq: Once | INTRAMUSCULAR | Status: AC
  Administered 2021-06-10: 80 mg via EPIDURAL

## 2021-06-10 NOTE — Discharge Instructions (Signed)
Post Procedure Spinal Discharge Instruction Sheet  You may resume a regular diet and any medications that you routinely take (including pain medications) unless otherwise noted by MD.  No driving day of procedure.  Light activity throughout the rest of the day.  Do not do any strenuous work, exercise, bending or lifting.  The day following the procedure, you can resume normal physical activity but you should refrain from exercising or physical therapy for at least three days thereafter.  You may apply ice to the injection site, 20 minutes on, 20 minutes off, as needed. Do not apply ice directly to skin.    Common Side Effects:  Headaches- take your usual medications as directed by your physician.  Increase your fluid intake.  Caffeinated beverages may be helpful.  Lie flat in bed until your headache resolves.  Restlessness or inability to sleep- you may have trouble sleeping for the next few days.  Ask your referring physician if you need any medication for sleep.  Facial flushing or redness- should subside within a few days.  Increased pain- a temporary increase in pain a day or two following your procedure is not unusual.  Take your pain medication as prescribed by your referring physician.  Leg cramps  Please contact our office at [phone] for the following symptoms: Fever greater than 100 degrees. Headaches unresolved with medication after 2-3 days. Increased swelling, pain, or redness at injection site.   Thank you for visiting [facility] today.    YOU MAY RESUME YOUR COUMADIN 12 HOURS AFTER INJECTION. YOU MAY RESUME YOUR PLETAL 4 HOURS AFTER INJECTION.

## 2021-06-13 ENCOUNTER — Other Ambulatory Visit (HOSPITAL_COMMUNITY): Payer: Self-pay

## 2021-06-13 NOTE — Addendum Note (Signed)
Addended by: Stanton Kidney on: 06/13/2021 02:53 PM   Modules accepted: Orders

## 2021-06-13 NOTE — Addendum Note (Signed)
Addended by: Stanton Kidney on: 06/13/2021 02:46 PM   Modules accepted: Orders

## 2021-06-14 ENCOUNTER — Other Ambulatory Visit (HOSPITAL_COMMUNITY): Payer: Self-pay

## 2021-06-14 ENCOUNTER — Encounter: Payer: Self-pay | Admitting: Family Medicine

## 2021-06-16 ENCOUNTER — Ambulatory Visit (INDEPENDENT_AMBULATORY_CARE_PROVIDER_SITE_OTHER): Payer: 59

## 2021-06-16 DIAGNOSIS — Z7901 Long term (current) use of anticoagulants: Secondary | ICD-10-CM | POA: Diagnosis not present

## 2021-06-16 LAB — POCT INR: INR: 2.6 (ref 2.0–3.0)

## 2021-06-16 NOTE — Patient Instructions (Addendum)
Pre visit review using our clinic review tool, if applicable. No additional management support is needed unless otherwise documented below in the visit note.  Continue 1 tablet daily except take 1/2 tablet on Mondays. Recheck in 2 weeks.

## 2021-06-16 NOTE — Progress Notes (Signed)
Continue 1 tablet daily except take 1/2 tablet on Mondays. Recheck in 2 weeks.

## 2021-06-24 ENCOUNTER — Other Ambulatory Visit: Payer: Self-pay

## 2021-06-24 ENCOUNTER — Ambulatory Visit (INDEPENDENT_AMBULATORY_CARE_PROVIDER_SITE_OTHER): Payer: 59

## 2021-06-24 ENCOUNTER — Encounter: Payer: Self-pay | Admitting: Cardiology

## 2021-06-24 VITALS — BP 123/81 | HR 74 | Temp 97.9°F | Resp 18 | Ht 69.0 in | Wt 175.6 lb

## 2021-06-24 DIAGNOSIS — D500 Iron deficiency anemia secondary to blood loss (chronic): Secondary | ICD-10-CM | POA: Diagnosis not present

## 2021-06-24 MED ORDER — FAMOTIDINE IN NACL 20-0.9 MG/50ML-% IV SOLN: 20.0000 mg | Freq: Once | INTRAVENOUS | Status: DC | PRN

## 2021-06-24 MED ORDER — ALBUTEROL SULFATE HFA 108 (90 BASE) MCG/ACT IN AERS: 2.0000 | INHALATION_SPRAY | Freq: Once | RESPIRATORY_TRACT | Status: DC | PRN

## 2021-06-24 MED ORDER — SODIUM CHLORIDE 0.9 % IV SOLN
200.0000 mg | Freq: Once | INTRAVENOUS | Status: AC
  Administered 2021-06-24: 200 mg via INTRAVENOUS
  Filled 2021-06-24: qty 10

## 2021-06-24 MED ORDER — DIPHENHYDRAMINE HCL 50 MG/ML IJ SOLN: 50.0000 mg | Freq: Once | INTRAMUSCULAR | Status: DC | PRN

## 2021-06-24 MED ORDER — ACETAMINOPHEN 325 MG PO TABS
650.0000 mg | ORAL_TABLET | Freq: Once | ORAL | Status: AC
  Administered 2021-06-24: 650 mg via ORAL
  Filled 2021-06-24: qty 2

## 2021-06-24 MED ORDER — EPINEPHRINE 0.3 MG/0.3ML IJ SOAJ: 0.3000 mg | Freq: Once | INTRAMUSCULAR | Status: DC | PRN

## 2021-06-24 MED ORDER — METHYLPREDNISOLONE SODIUM SUCC 125 MG IJ SOLR: 125.0000 mg | Freq: Once | INTRAMUSCULAR | Status: DC | PRN

## 2021-06-24 MED ORDER — DIPHENHYDRAMINE HCL 25 MG PO CAPS
50.0000 mg | ORAL_CAPSULE | Freq: Once | ORAL | Status: AC
  Administered 2021-06-24: 50 mg via ORAL
  Filled 2021-06-24: qty 2

## 2021-06-24 MED ORDER — SODIUM CHLORIDE 0.9 % IV SOLN: Freq: Once | INTRAVENOUS | Status: DC | PRN

## 2021-06-24 NOTE — Progress Notes (Signed)
Diagnosis: Iron Deficiency Anemia  Provider:  Marshell Garfinkel, MD  Procedure: Infusion  IV Type: Peripheral, IV Location: R Antecubital  Venofer (Iron Sucrose), Dose: 200 mg  Infusion Start Time: 8309  Infusion Stop Time: 4076  Post Infusion IV Care: Observation period completed and Peripheral IV Discontinued  Discharge: Condition: Good, Destination: Home . AVS provided to patient.   Performed by:  Cleophus Molt, RN

## 2021-06-27 ENCOUNTER — Other Ambulatory Visit: Payer: Self-pay

## 2021-06-27 ENCOUNTER — Ambulatory Visit (INDEPENDENT_AMBULATORY_CARE_PROVIDER_SITE_OTHER): Payer: 59

## 2021-06-27 VITALS — BP 121/79 | HR 65 | Temp 98.1°F | Resp 16 | Ht 69.0 in | Wt 171.2 lb

## 2021-06-27 DIAGNOSIS — D500 Iron deficiency anemia secondary to blood loss (chronic): Secondary | ICD-10-CM | POA: Diagnosis not present

## 2021-06-27 MED ORDER — ALBUTEROL SULFATE HFA 108 (90 BASE) MCG/ACT IN AERS: 2.0000 | INHALATION_SPRAY | Freq: Once | RESPIRATORY_TRACT | Status: DC | PRN

## 2021-06-27 MED ORDER — SODIUM CHLORIDE 0.9 % IV SOLN
200.0000 mg | Freq: Once | INTRAVENOUS | Status: AC
  Administered 2021-06-27: 200 mg via INTRAVENOUS
  Filled 2021-06-27: qty 10

## 2021-06-27 MED ORDER — DIPHENHYDRAMINE HCL 25 MG PO CAPS: 50.0000 mg | ORAL_CAPSULE | Freq: Once | ORAL | Status: DC

## 2021-06-27 MED ORDER — SODIUM CHLORIDE 0.9 % IV SOLN: Freq: Once | INTRAVENOUS | Status: DC | PRN

## 2021-06-27 MED ORDER — FAMOTIDINE IN NACL 20-0.9 MG/50ML-% IV SOLN: 20.0000 mg | Freq: Once | INTRAVENOUS | Status: DC | PRN

## 2021-06-27 MED ORDER — METHYLPREDNISOLONE SODIUM SUCC 125 MG IJ SOLR: 125.0000 mg | Freq: Once | INTRAMUSCULAR | Status: DC | PRN

## 2021-06-27 MED ORDER — ACETAMINOPHEN 325 MG PO TABS: 650.0000 mg | ORAL_TABLET | Freq: Once | ORAL | Status: DC

## 2021-06-27 MED ORDER — EPINEPHRINE 0.3 MG/0.3ML IJ SOAJ: 0.3000 mg | Freq: Once | INTRAMUSCULAR | Status: DC | PRN

## 2021-06-27 MED ORDER — DIPHENHYDRAMINE HCL 50 MG/ML IJ SOLN: 50.0000 mg | Freq: Once | INTRAMUSCULAR | Status: DC | PRN

## 2021-06-27 NOTE — Progress Notes (Signed)
Diagnosis: Iron Deficiency Anemia  Provider:  Marshell Garfinkel, MD  Procedure: Infusion  IV Type: Peripheral, IV Location: R Antecubital  Venofer (Iron Sucrose), Dose: 200 mg  Infusion Start Time: 0998  Infusion Stop Time: 3382  Post Infusion IV Care: Peripheral IV Discontinued  Discharge: Condition: Good, Destination: Home . AVS provided to patient.   Performed by:  Koren Shiver, RN

## 2021-06-29 ENCOUNTER — Ambulatory Visit (INDEPENDENT_AMBULATORY_CARE_PROVIDER_SITE_OTHER): Payer: 59

## 2021-06-29 ENCOUNTER — Other Ambulatory Visit: Payer: Self-pay

## 2021-06-29 VITALS — BP 117/76 | HR 64 | Temp 98.3°F | Resp 18 | Ht 69.0 in | Wt 173.2 lb

## 2021-06-29 DIAGNOSIS — D500 Iron deficiency anemia secondary to blood loss (chronic): Secondary | ICD-10-CM | POA: Diagnosis not present

## 2021-06-29 MED ORDER — ALBUTEROL SULFATE HFA 108 (90 BASE) MCG/ACT IN AERS: 2.0000 | INHALATION_SPRAY | Freq: Once | RESPIRATORY_TRACT | Status: DC | PRN

## 2021-06-29 MED ORDER — DIPHENHYDRAMINE HCL 50 MG/ML IJ SOLN: 50.0000 mg | Freq: Once | INTRAMUSCULAR | Status: DC | PRN

## 2021-06-29 MED ORDER — DIPHENHYDRAMINE HCL 25 MG PO CAPS: 50.0000 mg | ORAL_CAPSULE | Freq: Once | ORAL | Status: DC

## 2021-06-29 MED ORDER — EPINEPHRINE 0.3 MG/0.3ML IJ SOAJ: 0.3000 mg | Freq: Once | INTRAMUSCULAR | Status: DC | PRN

## 2021-06-29 MED ORDER — ACETAMINOPHEN 325 MG PO TABS: 650.0000 mg | ORAL_TABLET | Freq: Once | ORAL | Status: DC

## 2021-06-29 MED ORDER — METHYLPREDNISOLONE SODIUM SUCC 125 MG IJ SOLR: 125.0000 mg | Freq: Once | INTRAMUSCULAR | Status: DC | PRN

## 2021-06-29 MED ORDER — FAMOTIDINE IN NACL 20-0.9 MG/50ML-% IV SOLN: 20.0000 mg | Freq: Once | INTRAVENOUS | Status: DC | PRN

## 2021-06-29 MED ORDER — SODIUM CHLORIDE 0.9 % IV SOLN
200.0000 mg | Freq: Once | INTRAVENOUS | Status: AC
  Administered 2021-06-29: 200 mg via INTRAVENOUS
  Filled 2021-06-29: qty 10

## 2021-06-29 MED ORDER — SODIUM CHLORIDE 0.9 % IV SOLN: Freq: Once | INTRAVENOUS | Status: DC | PRN

## 2021-06-29 NOTE — Progress Notes (Signed)
Diagnosis: Iron Deficiency Anemia  Provider:  Marshell Garfinkel, MD  Procedure: Infusion  IV Type: Peripheral, IV Location: L Antecubital  Venofer (Iron Sucrose), Dose: 200 mg  Infusion Start Time: 8250  Infusion Stop Time: 5397  Post Infusion IV Care: Peripheral IV Discontinued  Discharge: Condition: Good, Destination: Home . AVS provided to patient.   Performed by:  Cleophus Molt, RN

## 2021-06-30 ENCOUNTER — Ambulatory Visit (INDEPENDENT_AMBULATORY_CARE_PROVIDER_SITE_OTHER): Payer: 59

## 2021-06-30 DIAGNOSIS — Z7901 Long term (current) use of anticoagulants: Secondary | ICD-10-CM

## 2021-06-30 DIAGNOSIS — R760 Raised antibody titer: Secondary | ICD-10-CM | POA: Diagnosis not present

## 2021-06-30 LAB — POCT INR: INR: 2.6 (ref 2.0–3.0)

## 2021-06-30 NOTE — Addendum Note (Signed)
Addended by: Stanton Kidney on: 06/30/2021 02:03 PM   Modules accepted: Orders

## 2021-06-30 NOTE — Progress Notes (Signed)
Pt called in report for INR of 2.6 and also received INR result from [employer]. Pt also reported that he went back to his prior dosing which was take 1 tablet daily except take 1/2 tablet on Mondays and Thursdays. Updated calendar to reflect what pt is actually taking. Advised pt to continue normal dosing. Pt verbalized understanding.  Continue 1 tablet daily except take 1/2 tablet on Mondays and Thursdays. Recheck in 2 weeks.

## 2021-06-30 NOTE — Patient Instructions (Addendum)
Pre visit review using our clinic review tool, if applicable. No additional management support is needed unless otherwise documented below in the visit note.  Continue 1 tablet daily except take 1/2 tablet on Mondays and Thursdays. Recheck in 2 weeks.

## 2021-06-30 NOTE — Telephone Encounter (Signed)
Spoke with wife, dpr on file. Advised that I unfortunately I was out of the office unexpectedly since last week, aware I returned today. Informed that we finally got diagnosis approved. Aware echo scheduler will call to get this scheduled. Wife thanks me for the help and will reach out once scheduled to get pt scheduled for f/u with Dr. Curt Bears.

## 2021-07-01 ENCOUNTER — Other Ambulatory Visit: Payer: Self-pay

## 2021-07-01 ENCOUNTER — Ambulatory Visit (INDEPENDENT_AMBULATORY_CARE_PROVIDER_SITE_OTHER): Payer: 59

## 2021-07-01 VITALS — BP 117/75 | HR 80 | Temp 98.4°F | Resp 18 | Ht 69.0 in | Wt 172.2 lb

## 2021-07-01 DIAGNOSIS — D500 Iron deficiency anemia secondary to blood loss (chronic): Secondary | ICD-10-CM | POA: Diagnosis not present

## 2021-07-01 MED ORDER — ALBUTEROL SULFATE HFA 108 (90 BASE) MCG/ACT IN AERS: 2.0000 | INHALATION_SPRAY | Freq: Once | RESPIRATORY_TRACT | Status: DC | PRN

## 2021-07-01 MED ORDER — SODIUM CHLORIDE 0.9 % IV SOLN: Freq: Once | INTRAVENOUS | Status: DC | PRN

## 2021-07-01 MED ORDER — DIPHENHYDRAMINE HCL 50 MG/ML IJ SOLN: 50.0000 mg | Freq: Once | INTRAMUSCULAR | Status: DC | PRN

## 2021-07-01 MED ORDER — EPINEPHRINE 0.3 MG/0.3ML IJ SOAJ: 0.3000 mg | Freq: Once | INTRAMUSCULAR | Status: DC | PRN

## 2021-07-01 MED ORDER — FAMOTIDINE IN NACL 20-0.9 MG/50ML-% IV SOLN: 20.0000 mg | Freq: Once | INTRAVENOUS | Status: DC | PRN

## 2021-07-01 MED ORDER — SODIUM CHLORIDE 0.9 % IV SOLN
200.0000 mg | Freq: Once | INTRAVENOUS | Status: AC
  Administered 2021-07-01: 200 mg via INTRAVENOUS
  Filled 2021-07-01: qty 10

## 2021-07-01 MED ORDER — ACETAMINOPHEN 325 MG PO TABS: 650.0000 mg | ORAL_TABLET | Freq: Once | ORAL | Status: DC

## 2021-07-01 MED ORDER — DIPHENHYDRAMINE HCL 25 MG PO CAPS: 50.0000 mg | ORAL_CAPSULE | Freq: Once | ORAL | Status: DC

## 2021-07-01 MED ORDER — METHYLPREDNISOLONE SODIUM SUCC 125 MG IJ SOLR: 125.0000 mg | Freq: Once | INTRAMUSCULAR | Status: DC | PRN

## 2021-07-01 NOTE — Progress Notes (Signed)
Diagnosis: Iron Deficiency Anemia  Provider:  Marshell Garfinkel, MD  Procedure: Infusion  IV Type: Peripheral, IV Location: L Hand  Venofer (Iron Sucrose), Dose: 200 mg  Infusion Start Time: 15.28 07/01/2021  Infusion Stop Time: 15.47 07/01/2021  Post Infusion IV Care: Peripheral IV Discontinued  Discharge: Condition: Good, Destination: Home . AVS provided to patient.   Performed by:  Arnoldo Morale, RN

## 2021-07-04 ENCOUNTER — Other Ambulatory Visit: Payer: Self-pay

## 2021-07-04 ENCOUNTER — Ambulatory Visit (INDEPENDENT_AMBULATORY_CARE_PROVIDER_SITE_OTHER): Payer: 59

## 2021-07-04 VITALS — BP 122/75 | HR 69 | Temp 98.2°F | Resp 18 | Ht 69.0 in | Wt 172.0 lb

## 2021-07-04 DIAGNOSIS — D500 Iron deficiency anemia secondary to blood loss (chronic): Secondary | ICD-10-CM | POA: Diagnosis not present

## 2021-07-04 MED ORDER — ALBUTEROL SULFATE HFA 108 (90 BASE) MCG/ACT IN AERS: 2.0000 | INHALATION_SPRAY | Freq: Once | RESPIRATORY_TRACT | Status: DC | PRN

## 2021-07-04 MED ORDER — SODIUM CHLORIDE 0.9 % IV SOLN
200.0000 mg | Freq: Once | INTRAVENOUS | Status: AC
  Administered 2021-07-04: 200 mg via INTRAVENOUS
  Filled 2021-07-04: qty 10

## 2021-07-04 MED ORDER — EPINEPHRINE 0.3 MG/0.3ML IJ SOAJ: 0.3000 mg | Freq: Once | INTRAMUSCULAR | Status: DC | PRN

## 2021-07-04 MED ORDER — ACETAMINOPHEN 325 MG PO TABS: 650.0000 mg | ORAL_TABLET | Freq: Once | ORAL | Status: DC

## 2021-07-04 MED ORDER — DIPHENHYDRAMINE HCL 25 MG PO CAPS: 50.0000 mg | ORAL_CAPSULE | Freq: Once | ORAL | Status: DC

## 2021-07-04 MED ORDER — FAMOTIDINE IN NACL 20-0.9 MG/50ML-% IV SOLN: 20.0000 mg | Freq: Once | INTRAVENOUS | Status: DC | PRN

## 2021-07-04 MED ORDER — METHYLPREDNISOLONE SODIUM SUCC 125 MG IJ SOLR: 125.0000 mg | Freq: Once | INTRAMUSCULAR | Status: DC | PRN

## 2021-07-04 MED ORDER — SODIUM CHLORIDE 0.9 % IV SOLN: Freq: Once | INTRAVENOUS | Status: DC | PRN

## 2021-07-04 MED ORDER — DIPHENHYDRAMINE HCL 50 MG/ML IJ SOLN: 50.0000 mg | Freq: Once | INTRAMUSCULAR | Status: DC | PRN

## 2021-07-04 NOTE — Progress Notes (Signed)
Diagnosis: Iron Deficiency Anemia  Provider:  Marshell Garfinkel, MD  Procedure: Infusion  IV Type: Peripheral, IV Location: R Hand  Venofer (Iron Sucrose), Dose: 200 mg  Infusion Start Time: 15.30  Infusion Stop Time: 15.50  Post Infusion IV Care: Peripheral IV Discontinued  Discharge: Condition: Good, Destination: Home . AVS provided to patient.   Performed by:  Arnoldo Morale, RN

## 2021-07-07 ENCOUNTER — Other Ambulatory Visit (HOSPITAL_COMMUNITY): Payer: 59

## 2021-07-08 ENCOUNTER — Ambulatory Visit (HOSPITAL_COMMUNITY): Payer: 59 | Attending: Internal Medicine

## 2021-07-08 ENCOUNTER — Other Ambulatory Visit: Payer: Self-pay

## 2021-07-08 DIAGNOSIS — I428 Other cardiomyopathies: Secondary | ICD-10-CM

## 2021-07-08 LAB — ECHOCARDIOGRAM COMPLETE
Area-P 1/2: 4.6 cm2
S' Lateral: 3.7 cm

## 2021-07-10 ENCOUNTER — Other Ambulatory Visit: Payer: Self-pay | Admitting: Family Medicine

## 2021-07-11 ENCOUNTER — Other Ambulatory Visit (HOSPITAL_COMMUNITY): Payer: Self-pay

## 2021-07-11 MED ORDER — FLUOXETINE HCL 20 MG PO TABS
ORAL_TABLET | Freq: Every day | ORAL | 3 refills | Status: DC
  Filled 2021-07-11: qty 90, 90d supply, fill #0
  Filled 2021-10-12: qty 90, 90d supply, fill #1
  Filled 2022-01-08: qty 90, 90d supply, fill #2

## 2021-07-20 ENCOUNTER — Ambulatory Visit (INDEPENDENT_AMBULATORY_CARE_PROVIDER_SITE_OTHER): Payer: 59

## 2021-07-20 DIAGNOSIS — Z7901 Long term (current) use of anticoagulants: Secondary | ICD-10-CM

## 2021-07-20 LAB — POCT INR: INR: 2.2 (ref 2.0–3.0)

## 2021-07-20 NOTE — Patient Instructions (Addendum)
Pre visit review using our clinic review tool, if applicable. No additional management support is needed unless otherwise documented below in the visit note. ? ?Increase dose today to take 1 1/2 tablets and the continue 1 tablet daily except take 1/2 tablet on Mondays and Thursdays. Recheck in 2 weeks. ?

## 2021-07-20 NOTE — Progress Notes (Signed)
Increase dose today to take 1 1/2 tablets and the continue 1 tablet daily except take 1/2 tablet on Mondays and Thursdays. Recheck in 2 weeks. Contacted pt by phone with dosing instructions. Pt verbalized understanding.  ?

## 2021-07-27 ENCOUNTER — Other Ambulatory Visit: Payer: Self-pay

## 2021-07-27 DIAGNOSIS — M2550 Pain in unspecified joint: Secondary | ICD-10-CM

## 2021-07-28 NOTE — Progress Notes (Unsigned)
PCP:  Leamon Arnt, MD Primary Cardiologist: None Electrophysiologist: Will Meredith Leeds, MD   [name] is a 56 y.o. male seen today for Will Meredith Leeds, MD for routine electrophysiology followup.  Since last being seen in our clinic the patient reports doing ***.  he denies chest pain, palpitations, dyspnea, PND, orthopnea, nausea, vomiting, dizziness, syncope, edema, weight gain, or early satiety.  Past Medical History:  Diagnosis Date   Anticoagulated on Coumadin    Antiphospholipid antibody positive, followed by Dr. Irene Limbo, Hematology 06/27/2016   Anxiety with depression 06/24/2014   Chronic back pain    ED (erectile dysfunction)    GERD (gastroesophageal reflux disease)    Hemorrhoids, internal, with bleeding    Hiatal hernia    History of adenomatous polyp of colon, 07/08/16, tubular adenomas    History of atrial fibrillation, episode 08/2016    History of cardiomyopathy ([place]) 08/04/2018   ECHO 12/02/18  1. The left ventricle has normal systolic function with an ejection fraction of 60-65%. The cavity size was normal. Left ventricular diastolic parameters were normal.  2. The right ventricle has normal systolic function. The cavity was normal. There is no increase in right ventricular wall thickness.  3. The mitral valve is grossly normal.  4. The aortic valve is tricuspid. Mild thic   History of DVT of lower extremity    Hyperlipidemia    Iron deficiency anemia due to chronic blood loss, thought to be from hemorrhoids while on coumadin, s/p iron infusion and blood transfusion x 2, 2018 2018   Low testosterone in male 05/04/2017   2018, 2019: Decision would be risk vs benefit. Could increase thrombotic risk especially if he has a polycythemic response.  If he decided to pursue this with an understanding of the risk - would use lowest dose of testosterone to maintain level low normal 300-400.  2020: Secondary polycythemia. Not a candidate.   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[facility]), sober since 04/2016    S/P insertion of iliac artery stent 2018   02-23-2017  right CIA PTA and stent/05-11-2016 in-stent restenosis s/p thrombectomy/2018 thrombectomy and  patch angioplasty right femoral artery    Past Surgical History:  Procedure Laterality Date   ANGIOPLASTY ILLIAC ARTERY Right 07/17/2016   Procedure: ANGIOPLASTY RIGHT COMMON ILIAC ARTERY;  Surgeon: Conrad Peoa, MD;  Location: [place];  Service: Vascular;  Laterality: Right;   ANTERIOR CERVICAL DECOMP/DISCECTOMY FUSION  07/21/2010   C5 -- C7   CARDIOVASCULAR STRESS TEST  06-14-2016   dr hilty   normal perfusion study w/ no reversible ischemia/  stress ef 44% but visually looks better , echo ordered (LVEF 30-44%)  , normal LV wall motion    COLONOSCOPY N/A 07/08/2016   Procedure: COLONOSCOPY;  Surgeon: Clarene Essex, MD;  [facility];  Service: Endoscopy;  Laterality: N/A;   ESOPHAGOGASTRODUODENOSCOPY N/A 07/08/2016   Procedure: ESOPHAGOGASTRODUODENOSCOPY (EGD);  Surgeon: Clarene Essex, MD;  Location: [facility];  Service: Endoscopy;  Laterality: N/A;   GROIN DEBRIDEMENT Right 07/25/2016   Procedure: EVACUATION HEMATOMA RIGHT GROIN;  Surgeon: Waynetta Sandy, MD;  Location: [place];  Service: Vascular;  Laterality: Right;   HEMORRHOID SURGERY  N/A 12/07/2016   Procedure: 3 COLUMN HEMORRHOIDECTOMY;  Surgeon: Leighton Ruff, MD;  Location: [facility];  Service: General;  Laterality: N/A;   INTRAOPERATIVE ARTERIOGRAM Right 07/17/2016   Procedure: INTRA OPERATIVE ANGIOGRAM OF RIGHT COMMON ILIAC ARTERY;  Surgeon: Conrad Manchester, MD;  Location: [place];  Service: Vascular;  Laterality:  Right;   KNEE SURGERY Right 1983   cartilage   PATCH ANGIOPLASTY Right 07/17/2016   Procedure: PATCH ANGIOPLASTY RIGHT FEMORAL ARTERY USING Rueben Bash BIOLOGIC PATCH;  Surgeon: Conrad Lismore, MD;  Location: [place];  Service: Vascular;  Laterality: Right;   PERIPHERAL VASCULAR CATHETERIZATION N/A 02/24/2016   Procedure: Abdominal Aortogram w/Lower Extremity;  Surgeon: Conrad Cherokee, MD;  Location: [place] [facility];  Service: Cardiovascular;  Laterality: N/A;   PERIPHERAL VASCULAR CATHETERIZATION Right 02/24/2016   Procedure: Peripheral Vascular Intervention;  Surgeon: Conrad Norwich, MD;  Location: [place] [facility];  Service: Cardiovascular;  Laterality: Right;  Common iliac   PERIPHERAL VASCULAR CATHETERIZATION N/A 05/11/2016   Procedure: Abdominal Aortogram;  Surgeon: Conrad Brockport, MD;  Location: [place] [facility];  Service: Cardiovascular;  Laterality: N/A;   PERIPHERAL VASCULAR CATHETERIZATION N/A 05/11/2016   Procedure: Lower Extremity Angiography;  Surgeon: Conrad [place], MD;  Location: [place] [facility];  Service: Cardiovascular;  Laterality: N/A;   POSTERIOR CERVICAL FUSION/FORAMINOTOMY N/A 07/05/2018   Procedure: Posterior cervical fusion with lateral mass fixation - Cervical five-Cervical six, Cervical six-Cervical seven;  Surgeon: Earnie Larsson, MD;  Location: [place];  Service: Neurosurgery;  Laterality: N/A;   THROMBECTOMY FEMORAL ARTERY Right 07/17/2016   Procedure: THROMBECTOMY RIGHT FEMORAL ARTERY;  Surgeon: Conrad , MD;  Location: [place];  Service: Vascular;  Laterality: Right;   TRANSTHORACIC ECHOCARDIOGRAM  06-20-2016   dr hilty   EF 55-60%,  grade 1 diastolic dysfunction, mild TR    Current Outpatient Medications  Medication Sig Dispense Refill   carvedilol (COREG) 6.25 MG tablet TAKE 1 TABLET BY MOUTH TWICE DAILY 60 tablet 11   cilostazol (PLETAL) 50 MG tablet TAKE 1 TABLET BY MOUTH TWICE DAILY 180 tablet 3   cyclobenzaprine (FLEXERIL) 10 MG tablet TAKE 1 TABLET BY MOUTH AT  BEDTIME AS NEEDED FOR MUSCLE SPASMS 90 tablet 3   enoxaparin (LOVENOX) 120 MG/0.8ML injection INJECT 120 MG (1 SYRINGE) INTO THE SKIN DAILY FOR 7 DOSES. 5.6 mL 0   enoxaparin (LOVENOX) 120 MG/0.8ML injection Inject 0.8 mLs (120 mg total) into the skin daily. TAKE LOVENOX AS INSTRUCTED BY ANTICOAGULATION CLINIC 6.4 mL 0   esomeprazole (NEXIUM) 20 MG capsule Take 20 mg by mouth daily at 12 noon.     FLUoxetine (PROZAC) 20 MG tablet TAKE 1 TABLET BY MOUTH ONCE DAILY 90 tablet 3   Fluticasone-Umeclidin-Vilant (TRELEGY ELLIPTA) 100-62.5-25 MCG/ACT AEPB Inhale 1 puff into the lungs daily 60 each 10   Fluticasone-Umeclidin-Vilant (TRELEGY ELLIPTA) 100-62.5-25 MCG/INH AEPB Inhale 1 puff into the lungs daily. 60 each 11   gabapentin (NEURONTIN) 600 MG tablet Take 1 tablet (600 mg total) by mouth 3 (three) times daily. 270 tablet 3   PFIZER-BIONTECH COVID-19 VACC 30 MCG/0.3ML injection      rosuvastatin (CRESTOR) 10 MG tablet TAKE 1 TABLET (10 MG TOTAL) BY MOUTH DAILY. 90 tablet 3   warfarin (COUMADIN) 6 MG tablet TAKE 1 TABLET BY MOUTH ONCE DAILY OR AS DIRECTED BY ANTICOAGULATION CLINIC 90 tablet 1   No current facility-administered medications for this visit.    Allergies  Allergen Reactions  Cymbalta [Duloxetine Hcl] Other (See Comments)    Headaches    Social History   Socioeconomic History   Marital status: Married    Spouse name: Almyra Free   Number of children: Not on file   Years of education: Not on file   Highest education level: Not on file  Occupational History   Occupation: Disabled  Tobacco Use   Smoking status: Every Day    Packs/day: 0.50    Years: 33.00    Pack years: 16.50    Types: Cigarettes, E-cigarettes   Smokeless tobacco: Never  Vaping Use   Vaping Use: Never used  Substance and Sexual Activity   Alcohol use: No    Comment: Sober since 2017   Drug use: No   Sexual activity: Yes    Partners: Female  Other Topics Concern   Not on file  Social History Narrative    Not on file   Social Determinants of Health   Financial Resource Strain: Not on file  Food Insecurity: Not on file  Transportation Needs: Not on file  Physical Activity: Not on file  Stress: Not on file  Social Connections: Not on file  Intimate Partner Violence: Not on file     Review of Systems: All other systems reviewed and are otherwise negative except as noted above.  Physical Exam: There were no vitals filed for this visit.  GEN- The patient is well appearing, alert and oriented x 3 today.   HEENT: normocephalic, atraumatic; sclera clear, conjunctiva pink; hearing intact; oropharynx clear; neck supple, no JVP Lymph- no cervical lymphadenopathy Lungs- Clear to ausculation bilaterally, normal work of breathing.  No wheezes, rales, rhonchi Heart- Regular rate and rhythm, no murmurs, rubs or gallops, PMI not laterally displaced GI- soft, non-tender, non-distended, bowel sounds present, no hepatosplenomegaly Extremities- no clubbing, cyanosis, or edema; DP/PT/radial pulses 2+ bilaterally MS- no significant deformity or atrophy Skin- warm and dry, no rash or lesion Psych- euthymic mood, full affect Neuro- strength and sensation are intact  EKG is ordered. Personal review of EKG from today shows ***  Additional studies reviewed include: Previous EP office notes.   Assessment and Plan:  1.  PVCs:  Has been off mexiletine due to side effects. Echo 06/2021 showed normal EF He is happy off medications   If has recurrent symptoms, ablation or alternative medications would be reasonable.   2.  Nonischemic cardiomyopathy:  Echo 07/08/2021 LVEF 55-60%, Grade 1 DD   Follow up with {Blank single:19197::"Dr. Allred","Dr. Arlan Organ. Klein","Dr. Camnitz","Dr. Lambert","EP APP"} in {Blank single:19197::"2 weeks","4 weeks","3 months","6 months","12 months","as usual post gen change"}   Shirley Friar, PA-C  07/28/21 9:07 AM

## 2021-08-01 ENCOUNTER — Other Ambulatory Visit: Payer: Self-pay | Admitting: Pharmacy Technician

## 2021-08-02 ENCOUNTER — Other Ambulatory Visit (HOSPITAL_COMMUNITY): Payer: Self-pay

## 2021-08-04 ENCOUNTER — Ambulatory Visit (INDEPENDENT_AMBULATORY_CARE_PROVIDER_SITE_OTHER): Payer: 59

## 2021-08-04 ENCOUNTER — Other Ambulatory Visit (HOSPITAL_COMMUNITY): Payer: Self-pay

## 2021-08-04 DIAGNOSIS — Z7901 Long term (current) use of anticoagulants: Secondary | ICD-10-CM

## 2021-08-04 LAB — POCT INR: INR: 3 (ref 2.0–3.0)

## 2021-08-04 NOTE — Progress Notes (Addendum)
Received INR result of 3.0 from [employer].  ?Continue 1 tablet daily except take 1/2 tablet on Mondays and Thursdays. Recheck in 2 weeks. LVM for pt with instructions to continue current dosing.  ?

## 2021-08-04 NOTE — Patient Instructions (Addendum)
Pre visit review using our clinic review tool, if applicable. No additional management support is needed unless otherwise documented below in the visit note. ? ?Continue 1 tablet daily except take 1/2 tablet on Mondays and Thursdays. Recheck in 2 weeks.  ?

## 2021-08-05 ENCOUNTER — Ambulatory Visit (INDEPENDENT_AMBULATORY_CARE_PROVIDER_SITE_OTHER): Payer: 59 | Admitting: Student

## 2021-08-05 ENCOUNTER — Other Ambulatory Visit (INDEPENDENT_AMBULATORY_CARE_PROVIDER_SITE_OTHER): Payer: 59

## 2021-08-05 ENCOUNTER — Encounter: Payer: Self-pay | Admitting: Student

## 2021-08-05 ENCOUNTER — Other Ambulatory Visit: Payer: Self-pay

## 2021-08-05 VITALS — BP 114/80 | HR 79 | Ht 69.0 in | Wt 170.0 lb

## 2021-08-05 DIAGNOSIS — Z720 Tobacco use: Secondary | ICD-10-CM | POA: Diagnosis not present

## 2021-08-05 DIAGNOSIS — M2550 Pain in unspecified joint: Secondary | ICD-10-CM

## 2021-08-05 DIAGNOSIS — I428 Other cardiomyopathies: Secondary | ICD-10-CM

## 2021-08-05 DIAGNOSIS — I779 Disorder of arteries and arterioles, unspecified: Secondary | ICD-10-CM

## 2021-08-05 DIAGNOSIS — I493 Ventricular premature depolarization: Secondary | ICD-10-CM

## 2021-08-05 LAB — CBC WITH DIFFERENTIAL/PLATELET
Basophils Absolute: 0.1 10*3/uL (ref 0.0–0.1)
Basophils Relative: 0.7 % (ref 0.0–3.0)
Eosinophils Absolute: 0.3 10*3/uL (ref 0.0–0.7)
Eosinophils Relative: 2.7 % (ref 0.0–5.0)
HCT: 47.7 % (ref 39.0–52.0)
Hemoglobin: 16.3 g/dL (ref 13.0–17.0)
Lymphocytes Relative: 23.5 % (ref 12.0–46.0)
Lymphs Abs: 2.7 10*3/uL (ref 0.7–4.0)
MCHC: 34.1 g/dL (ref 30.0–36.0)
MCV: 98.5 fl (ref 78.0–100.0)
Monocytes Absolute: 0.7 10*3/uL (ref 0.1–1.0)
Monocytes Relative: 6.2 % (ref 3.0–12.0)
Neutro Abs: 7.6 10*3/uL (ref 1.4–7.7)
Neutrophils Relative %: 66.9 % (ref 43.0–77.0)
Platelets: 227 10*3/uL (ref 150.0–400.0)
RBC: 4.84 Mil/uL (ref 4.22–5.81)
RDW: 14 % (ref 11.5–15.5)
WBC: 11.3 10*3/uL — ABNORMAL HIGH (ref 4.0–10.5)

## 2021-08-05 LAB — IBC PANEL
Iron: 80 ug/dL (ref 42–165)
Saturation Ratios: 23.2 % (ref 20.0–50.0)
TIBC: 344.4 ug/dL (ref 250.0–450.0)
Transferrin: 246 mg/dL (ref 212.0–360.0)

## 2021-08-05 LAB — FERRITIN: Ferritin: 126.9 ng/mL (ref 22.0–322.0)

## 2021-08-05 NOTE — Patient Instructions (Signed)
Medication Instructions:  Your physician recommends that you continue on your current medications as directed. Please refer to the Current Medication list given to you today.  *If you need a refill on your cardiac medications before your next appointment, please call your pharmacy*   Lab Work: None If you have labs (blood work) drawn today and your tests are completely normal, you will receive your results only by: MyChart Message (if you have MyChart) OR A paper copy in the mail If you have any lab test that is abnormal or we need to change your treatment, we will call you to review the results.   Follow-Up: At [facility], you and your health needs are our priority.  As part of our continuing mission to provide you with exceptional heart care, we have created designated Provider Care Teams.  These Care Teams include your primary Cardiologist (physician) and Advanced Practice Providers (APPs -  Physician Assistants and Nurse Practitioners) who all work together to provide you with the care you need, when you need it.   Your next appointment:   1 year(s)  The format for your next appointment:   In Person  Provider:   Will Camnitz, MD{   

## 2021-08-10 ENCOUNTER — Other Ambulatory Visit (HOSPITAL_COMMUNITY): Payer: Self-pay

## 2021-08-10 ENCOUNTER — Other Ambulatory Visit: Payer: Self-pay | Admitting: Family Medicine

## 2021-08-10 MED ORDER — FLUTICASONE-UMECLIDIN-VILANT 100-62.5-25 MCG/ACT IN AEPB
1.0000 | INHALATION_SPRAY | Freq: Every day | RESPIRATORY_TRACT | 11 refills | Status: DC
  Filled 2021-08-10: qty 60, 30d supply, fill #0
  Filled 2021-11-03: qty 60, 30d supply, fill #1
  Filled 2022-01-08: qty 60, 30d supply, fill #2
  Filled 2022-04-16: qty 60, 30d supply, fill #3
  Filled 2022-07-09: qty 60, 30d supply, fill #4

## 2021-08-11 ENCOUNTER — Other Ambulatory Visit (HOSPITAL_COMMUNITY): Payer: Self-pay

## 2021-08-19 ENCOUNTER — Ambulatory Visit (INDEPENDENT_AMBULATORY_CARE_PROVIDER_SITE_OTHER): Payer: 59

## 2021-08-19 DIAGNOSIS — Z7901 Long term (current) use of anticoagulants: Secondary | ICD-10-CM | POA: Diagnosis not present

## 2021-08-19 LAB — POCT INR: INR: 2.5 (ref 2.0–3.0)

## 2021-08-19 NOTE — Patient Instructions (Addendum)
Pre visit review using our clinic review tool, if applicable. No additional management support is needed unless otherwise documented below in the visit note. ? ?Received INR result of 2.5 from [employer].  ?Continue 1 tablet daily except take 1/2 tablet on Mondays and Thursdays. Recheck in 2 weeks on 4/13. ?

## 2021-08-19 NOTE — Progress Notes (Signed)
Received INR result of 2.5 from [employer].  ?Continue 1 tablet daily except take 1/2 tablet on Mondays and Thursdays. Recheck in 2 weeks. LVM for pt with instructions to continue current dosing.  ?

## 2021-09-01 ENCOUNTER — Ambulatory Visit (INDEPENDENT_AMBULATORY_CARE_PROVIDER_SITE_OTHER): Payer: 59

## 2021-09-01 DIAGNOSIS — Z7901 Long term (current) use of anticoagulants: Secondary | ICD-10-CM

## 2021-09-01 DIAGNOSIS — R760 Raised antibody titer: Secondary | ICD-10-CM | POA: Diagnosis not present

## 2021-09-01 LAB — POCT INR
INR: 2.5 (ref 2.0–3.0)
INR: 2.5 (ref 2.0–3.0)

## 2021-09-01 NOTE — Patient Instructions (Addendum)
Pre visit review using our clinic review tool, if applicable. No additional management support is needed unless otherwise documented below in the visit note. ? ?Continue 1 tablet daily except take 1/2 tablet on Mondays and Thursdays. Recheck in 2 weeks, on 4/27.  ?

## 2021-09-01 NOTE — Progress Notes (Signed)
Received INR result of 2.5 from [employer].  ?Continue 1 tablet daily except take 1/2 tablet on Mondays and Thursdays. Recheck in 2 weeks, on 4/27. LVM for pt with instructions to continue current dosing.  ?

## 2021-09-02 ENCOUNTER — Encounter: Payer: 59 | Admitting: Family Medicine

## 2021-09-05 ENCOUNTER — Encounter: Payer: 59 | Admitting: Family Medicine

## 2021-09-07 NOTE — Progress Notes (Signed)
?Charlann Boxer D.O. ?[facility] ?[place] ?Phone: [phone] ?Subjective:   ?I, [name], am serving as a scribe for Dr. Hulan Saas. ? ?This visit occurred during the SARS-CoV-2 public health emergency.  Safety protocols were in place, including screening questions prior to the visit, additional usage of staff PPE, and extensive cleaning of exam room while observing appropriate contact time as indicated for disinfecting solutions.  ? ? ?I'm seeing this patient by the request  of:  William Arnt, MD ? ?CC: Back pain follow-up ? ?[id number]  ?02/15/2021 ?Patient does have the progression of the L4-L5 broad-based disc that I do think is continuing to give him some difficulty.  Patient is having increasing and tightness of the musculature including the psoas muscle and iliacus patient will likely will get a another epidural.  Secondary to the blood thinner we will have to do the bridging which she has done previously.  Patient is traveling in the next month and would like to potentially not have the pain.  Discussed the possibility of needing prednisone for this.  If necessary as well but that is in 6 to 8 weeks.  But see how patient responds to the next injection that did last greater than 6 months. ? ?Update 09/08/2021 ?Ladamien Christensen is a 56 y.o. male coming in with complaint of LBP. Epidural 06/10/2021. Patient states that he has less pain with more frequent injections. When pain is present with squatting and standing.  ? ?  ? ?Past Medical History:  ?Diagnosis Date  ? Anticoagulated on Coumadin   ? Antiphospholipid antibody positive, followed by Dr. Irene Limbo, Hematology 06/27/2016  ? Anxiety with depression 06/24/2014  ? Chronic back pain   ? ED (erectile dysfunction)   ? GERD (gastroesophageal reflux disease)   ? Hemorrhoids, internal, with bleeding   ? Hiatal hernia   ? History of adenomatous polyp of colon, 07/08/16, tubular adenomas   ? History of atrial  fibrillation, episode 08/2016   ? History of cardiomyopathy ([place]) 08/04/2018  ? ECHO 12/02/18  1. The left ventricle has normal systolic function with an ejection fraction of 60-65%. The cavity size was normal. Left ventricular diastolic parameters were normal.  2. The right ventricle has normal systolic function. The cavity was normal. There is no increase in right ventricular wall thickness.  3. The mitral valve is grossly normal.  4. The aortic valve is tricuspid. Mild thic  ? History of DVT of lower extremity   ? Hyperlipidemia   ? Iron deficiency anemia due to chronic blood loss, thought to be from hemorrhoids while on coumadin, s/p iron infusion and blood transfusion x 2, 2018 2018  ? Low testosterone in male 05/04/2017  ? 2018, 2019: Decision would be risk vs benefit. Could increase thrombotic risk especially if he has a polycythemic response.  If he decided to pursue this with an understanding of the risk - would use lowest dose of testosterone to maintain level low normal 300-400.  2020: Secondary polycythemia. Not a candidate.  ? 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   ? Nicotine dependence   ? OA (osteoarthritis)   ? PAD (peripheral artery disease) ([place]), followed by Dr Bridgett Larsson   ? hx right CIA angioplasty and stent 02-24-2016/right CIA thromectomy and patch angioplasty for restenosis 07-18-2015  ? PVCs (premature ventricular contractions)   ? 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 S/P insertion of iliac artery stent 2018  ? 02-23-2017  right CIA PTA and stent/05-11-2016 in-stent restenosis s/p thrombectomy/2018 thrombectomy and  patch angioplasty right femoral artery   ? ?Past Surgical History:  ?Procedure Laterality Date  ? ANGIOPLASTY ILLIAC ARTERY Right 07/17/2016  ? Procedure: ANGIOPLASTY RIGHT COMMON ILIAC ARTERY;  Surgeon: Conrad W[place], MD;  Location: [place];  Service: Vascular;  Laterality: Right;  ? ANTERIOR CERVICAL DECOMP/DISCECTOMY FUSION  07/21/2010  ? C5 -- C7  ?  CARDIOVASCULAR STRESS TEST  06-14-2016   dr hilty  ? normal perfusion study w/ no reversible ischemia/  stress ef 44% but visually looks better , echo ordered (LVEF 30-44%)  , normal LV wall motion   ? COLONOSCOPY N/A 07/08/2016  ? Procedure: COLONOSCOPY;  Surgeon: Clarene Essex, MD;  [facility];  Service: Endoscopy;  Laterality: N/A;  ? ESOPHAGOGASTRODUODENOSCOPY N/A 07/08/2016  ? Procedure: ESOPHAGOGASTRODUODENOSCOPY (EGD);  Surgeon: Clarene Essex, MD;  [facility];  Service: Endoscopy;  Laterality: N/A;  ? GROIN DEBRIDEMENT Right 07/25/2016  ? Procedure: EVACUATION HEMATOMA RIGHT GROIN;  Surgeon: Waynetta Sandy, MD;  Location: [place];  Service: Vascular;  Laterality: Right;  ? HEMORRHOID SURGERY N/A 12/07/2016  ? Procedure: 3 COLUMN HEMORRHOIDECTOMY;  Surgeon: Leighton Ruff, MD;  Location: [facility];  Service: General;  Laterality: N/A;  ? INTRAOPERATIVE ARTERIOGRAM Right 07/17/2016  ? Procedure: INTRA OPERATIVE ANGIOGRAM OF RIGHT COMMON ILIAC ARTERY;  Surgeon: Conrad [place], MD;  Location: [place];  Service: Vascular;  Laterality: Right;  ? [place]  ? cartilage  ? PATCH ANGIOPLASTY Right 07/17/2016  ? Procedure: PATCH ANGIOPLASTY RIGHT FEMORAL ARTERY USING Rueben Bash BIOLOGIC PATCH;  Surgeon: Conrad Marshallberg, MD;  Location: [place];  Service: Vascular;  Laterality: Right;  ? PERIPHERAL VASCULAR CATHETERIZATION N/A 02/24/2016  ? Procedure: Abdominal Aortogram w/Lower Extremity;  Surgeon: Conrad Scissors, MD;  Location: [place] [facility];  Service: Cardiovascular;  Laterality: N/A;  ? PERIPHERAL VASCULAR CATHETERIZATION Right 02/24/2016  ? Procedure: Peripheral Vascular Intervention;  Surgeon: Conrad Yoder, MD;  Location: [place] [facility];  Service: Cardiovascular;  Laterality: Right;  Common iliac  ? PERIPHERAL VASCULAR CATHETERIZATION N/A 05/11/2016  ? Procedure: Abdominal Aortogram;  Surgeon: Conrad [place], MD;  Location: [place] [facility];  Service: Cardiovascular;   Laterality: N/A;  ? PERIPHERAL VASCULAR CATHETERIZATION N/A 05/11/2016  ? Procedure: Lower Extremity Angiography;  Surgeon: Conrad [place], MD;  Location: [place] [facility];  Service: Cardiovascular;  Laterality: N/A;  ? POSTERIOR CERVICAL FUSION/FORAMINOTOMY N/A 07/05/2018  ? Procedure: Posterior cervical fusion with lateral mass fixation - Cervical five-Cervical six, Cervical six-Cervical seven;  Surgeon: Earnie Larsson, MD;  Location: [place];  Service: Neurosurgery;  Laterality: N/A;  ? THROMBECTOMY FEMORAL ARTERY Right 07/17/2016  ? Procedure: THROMBECTOMY RIGHT FEMORAL ARTERY;  Surgeon: Conrad [place], MD;  Location: [place];  Service: Vascular;  Laterality: Right;  ? TRANSTHORACIC ECHOCARDIOGRAM  06-20-2016   dr hilty  ? EF 55-60%,  grade 1 diastolic dysfunction, mild TR  ? ?Social History  ? ?Socioeconomic History  ? Marital status: Married  ?  Spouse name: Almyra Free  ? Number of children: Not on file  ? Years of education: Not on file  ? Highest education level: Not on file  ?Occupational History  ? Occupation: Disabled  ?Tobacco Use  ? Smoking status: Every Day  ?  Packs/day: 0.50  ?  Years: 33.00  ?  Pack years: 16.50  ?  Types: Cigarettes, E-cigarettes  ? Smokeless tobacco: Never  ?  Vaping Use  ? Vaping Use: Never used  ?Substance and Sexual Activity  ? Alcohol use: No  ?  Comment: Sober since 2017  ? Drug use: No  ? Sexual activity: Yes  ?  Partners: Female  ?Other Topics Concern  ? Not on file  ?Social History Narrative  ? Not on file  ? ?Social Determinants of Health  ? ?Financial Resource Strain: Not on file  ?Food Insecurity: Not on file  ?Transportation Needs: Not on file  ?Physical Activity: Not on file  ?Stress: Not on file  ?Social Connections: Not on file  ? ?Allergies  ?Allergen Reactions  ? Cymbalta [Duloxetine Hcl] Other (See Comments)  ?  Headaches  ? ?Family History  ?Problem Relation Age of Onset  ? Lung cancer Father   ? ? ? ?Current Outpatient Medications (Cardiovascular):  ?  carvedilol (COREG) 6.25 MG  tablet, TAKE 1 TABLET BY MOUTH TWICE DAILY ?  rosuvastatin (CRESTOR) 10 MG tablet, TAKE 1 TABLET (10 MG TOTAL) BY MOUTH DAILY. ? ?Current Outpatient Medications (Respiratory):  ?  Fluticasone-Umeclidin-Vilant 100-6

## 2021-09-09 ENCOUNTER — Other Ambulatory Visit: Payer: Self-pay | Admitting: Family Medicine

## 2021-09-09 ENCOUNTER — Ambulatory Visit (INDEPENDENT_AMBULATORY_CARE_PROVIDER_SITE_OTHER): Payer: 59 | Admitting: Family Medicine

## 2021-09-09 ENCOUNTER — Other Ambulatory Visit (HOSPITAL_COMMUNITY): Payer: Self-pay

## 2021-09-09 VITALS — BP 130/88 | HR 83 | Ht 69.0 in | Wt 170.0 lb

## 2021-09-09 DIAGNOSIS — D500 Iron deficiency anemia secondary to blood loss (chronic): Secondary | ICD-10-CM

## 2021-09-09 DIAGNOSIS — G8929 Other chronic pain: Secondary | ICD-10-CM | POA: Diagnosis not present

## 2021-09-09 DIAGNOSIS — M5416 Radiculopathy, lumbar region: Secondary | ICD-10-CM

## 2021-09-09 DIAGNOSIS — M5441 Lumbago with sciatica, right side: Secondary | ICD-10-CM

## 2021-09-09 DIAGNOSIS — M2550 Pain in unspecified joint: Secondary | ICD-10-CM

## 2021-09-09 DIAGNOSIS — I779 Disorder of arteries and arterioles, unspecified: Secondary | ICD-10-CM

## 2021-09-09 MED ORDER — ROSUVASTATIN CALCIUM 10 MG PO TABS
ORAL_TABLET | Freq: Every day | ORAL | 0 refills | Status: DC
  Filled 2021-09-09: qty 30, 30d supply, fill #0

## 2021-09-09 MED ORDER — PREDNISONE 20 MG PO TABS
20.0000 mg | ORAL_TABLET | Freq: Every day | ORAL | 0 refills | Status: DC
  Filled 2021-09-09: qty 5, 5d supply, fill #0

## 2021-09-09 NOTE — Assessment & Plan Note (Signed)
Chronic problem but stable.  Patient has responded extremely well to the epidural.  We discussed potentially preventative injections today and every 3 to 4 months.  We will put in a order at this time to have another one in case patient would like to have it earlier.  In addition to this we will continue to monitor patient's iron deficiency that could also contribute.  We will ensure that it is staying stable.  Follow-up with me again as needed ?

## 2021-09-09 NOTE — Assessment & Plan Note (Signed)
Patient did have another infusion and feeling significantly better.  Recheck ferritin levels again in the near future.  Should probably check every 3 to 6 months ?

## 2021-09-09 NOTE — Patient Instructions (Addendum)
Labs and Epidural order placed ? ? ?

## 2021-09-12 ENCOUNTER — Other Ambulatory Visit: Payer: Self-pay | Admitting: Family Medicine

## 2021-09-15 ENCOUNTER — Ambulatory Visit (INDEPENDENT_AMBULATORY_CARE_PROVIDER_SITE_OTHER): Payer: 59

## 2021-09-15 DIAGNOSIS — Z7901 Long term (current) use of anticoagulants: Secondary | ICD-10-CM | POA: Diagnosis not present

## 2021-09-15 LAB — POCT INR: INR: 3.2 — AB (ref 2.0–3.0)

## 2021-09-15 NOTE — Patient Instructions (Addendum)
Pre visit review using our clinic review tool, if applicable. No additional management support is needed unless otherwise documented below in the visit note. ? ?Hold dose today and then continue 1 tablet daily except take 1/2 tablet on Mondays and Thursdays. Recheck in 2 weeks, on 5/11.  ?

## 2021-09-15 NOTE — Progress Notes (Signed)
Received INR result of 3.2 from [employer].  ?Hold dose today and then continue 1 tablet daily except take 1/2 tablet on Mondays and Thursdays. Recheck in 2 weeks, on 5/11. LVM for pt with instructions to continue current dosing.  ?

## 2021-09-20 ENCOUNTER — Ambulatory Visit (HOSPITAL_COMMUNITY)
Admission: RE | Admit: 2021-09-20 | Discharge: 2021-09-20 | Disposition: A | Discharge status: Home / Self Care | Payer: 59 | Source: Ambulatory Visit | Attending: Vascular Surgery | Admitting: Vascular Surgery

## 2021-09-20 ENCOUNTER — Ambulatory Visit (INDEPENDENT_AMBULATORY_CARE_PROVIDER_SITE_OTHER)
Admission: RE | Admit: 2021-09-20 | Discharge: 2021-09-20 | Disposition: A | Discharge status: Home / Self Care | Payer: 59 | Source: Ambulatory Visit | Attending: Vascular Surgery | Admitting: Vascular Surgery

## 2021-09-20 ENCOUNTER — Ambulatory Visit (INDEPENDENT_AMBULATORY_CARE_PROVIDER_SITE_OTHER): Payer: 59 | Admitting: Physician Assistant

## 2021-09-20 VITALS — BP 118/65 | HR 69 | Temp 98.0°F | Resp 18 | Ht 70.0 in | Wt 171.3 lb

## 2021-09-20 DIAGNOSIS — I779 Disorder of arteries and arterioles, unspecified: Secondary | ICD-10-CM

## 2021-09-20 DIAGNOSIS — F17200 Nicotine dependence, unspecified, uncomplicated: Secondary | ICD-10-CM | POA: Diagnosis not present

## 2021-09-20 NOTE — Progress Notes (Signed)
[facility] [place] ?HISTORY AND PHYSICAL  ? ?History of Present Illness:  Patient is a 56 y.o. year old male who presents for evaluation of PAD.  He has a history of right common iliac angioplasty and stenting by Dr. Bridgett Larsson in 2017.  It was for lifestyle limiting short distance claudication.  In 2018 he developed ischemia and underwent  thrombectomy of the right common iliac artery as well as right common femoral artery patch angioplasty.  He is here today for surveillance follow up.   ? He denies short distance claudication, rest pain or non healing wounds.  He stays active outside doing chores.  He continues to be an everyday smoker.   ? He is medically managed on Statin and Coumadin history of Afib and DVT.   ?  ? ?Past Medical History:  ?Diagnosis Date  ? Anticoagulated on Coumadin   ? Antiphospholipid antibody positive, followed by Dr. Irene Limbo, Hematology 06/27/2016  ? Anxiety with depression 06/24/2014  ? Chronic back pain   ? ED (erectile dysfunction)   ? GERD (gastroesophageal reflux disease)   ? Hemorrhoids, internal, with bleeding   ? Hiatal hernia   ? History of adenomatous polyp of colon, 07/08/16, tubular adenomas   ? History of atrial fibrillation, episode 08/2016   ? History of cardiomyopathy ([place]) 08/04/2018  ? ECHO 12/02/18  1. The left ventricle has normal systolic function with an ejection fraction of 60-65%. The cavity size was normal. Left ventricular diastolic parameters were normal.  2. The right ventricle has normal systolic function. The cavity was normal. There is no increase in right ventricular wall thickness.  3. The mitral valve is grossly normal.  4. The aortic valve is tricuspid. Mild thic  ? History of DVT of lower extremity   ? Hyperlipidemia   ? Iron deficiency anemia due to chronic blood loss, thought to be from hemorrhoids while on coumadin, s/p iron infusion and blood transfusion x 2, 2018 2018  ? Low testosterone in male 05/04/2017  ? 2018, 2019: Decision would be  risk vs benefit. Could increase thrombotic risk especially if he has a polycythemic response.  If he decided to pursue this with an understanding of the risk - would use lowest dose of testosterone to maintain level low normal 300-400.  2020: Secondary polycythemia. Not a candidate.  ? 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   ? Nicotine dependence   ? OA (osteoarthritis)   ? PAD (peripheral artery disease) ([place]), followed by Dr Bridgett Larsson   ? hx right CIA angioplasty and stent 02-24-2016/right CIA thromectomy and patch angioplasty for restenosis 07-18-2015  ? PVCs (premature ventricular contractions)   ? 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 S/P insertion of iliac artery stent 2018  ? 02-23-2017  right CIA PTA and stent/05-11-2016 in-stent restenosis s/p thrombectomy/2018 thrombectomy and  patch angioplasty right femoral artery   ? ? ?Past Surgical History:  ?Procedure Laterality Date  ? ANGIOPLASTY ILLIAC ARTERY Right 07/17/2016  ? Procedure: ANGIOPLASTY RIGHT COMMON ILIAC ARTERY;  Surgeon: Conrad West Conshohocken, MD;  Location: [place];  Service: Vascular;  Laterality: Right;  ? ANTERIOR CERVICAL DECOMP/DISCECTOMY FUSION  07/21/2010  ? C5 -- C7  ? CARDIOVASCULAR STRESS TEST  06-14-2016   dr hilty  ? normal perfusion study w/ no reversible ischemia/  stress ef 44% but visually looks better , echo ordered (LVEF 30-44%)  , normal LV wall motion   ? COLONOSCOPY N/A 07/08/2016  ? Procedure: COLONOSCOPY;  Surgeon: Clarene Essex, MD;  Location: [facility];  Service: Endoscopy;  Laterality: N/A;  ? ESOPHAGOGASTRODUODENOSCOPY N/A 07/08/2016  ? Procedure: ESOPHAGOGASTRODUODENOSCOPY (EGD);  Surgeon: Clarene Essex, MD;  Location: [facility];  Service: Endoscopy;  Laterality: N/A;  ? GROIN DEBRIDEMENT Right 07/25/2016  ? Procedure: EVACUATION HEMATOMA RIGHT GROIN;  Surgeon: Waynetta Sandy, MD;  Location: [place];  Service: Vascular;  Laterality: Right;  ? HEMORRHOID SURGERY N/A 12/07/2016  ? Procedure: 3 COLUMN  HEMORRHOIDECTOMY;  Surgeon: Leighton Ruff, MD;  Location: [facility];  Service: General;  Laterality: N/A;  ? INTRAOPERATIVE ARTERIOGRAM Right 07/17/2016  ? Procedure: INTRA OPERATIVE ANGIOGRAM OF RIGHT COMMON ILIAC ARTERY;  Surgeon: Conrad Bellewood, MD;  Location: [place];  Service: Vascular;  Laterality: Right;  ? [place]  ? cartilage  ? PATCH ANGIOPLASTY Right 07/17/2016  ? Procedure: PATCH ANGIOPLASTY RIGHT FEMORAL ARTERY USING Rueben Bash BIOLOGIC PATCH;  Surgeon: Conrad Dalzell, MD;  Location: [place];  Service: Vascular;  Laterality: Right;  ? PERIPHERAL VASCULAR CATHETERIZATION N/A 02/24/2016  ? Procedure: Abdominal Aortogram w/Lower Extremity;  Surgeon: Conrad Riverton, MD;  Location: [facility];  Service: Cardiovascular;  Laterality: N/A;  ? PERIPHERAL VASCULAR CATHETERIZATION Right 02/24/2016  ? Procedure: Peripheral Vascular Intervention;  Surgeon: Conrad Kenvir, MD;  Location: [facility];  Service: Cardiovascular;  Laterality: Right;  Common iliac  ? PERIPHERAL VASCULAR CATHETERIZATION N/A 05/11/2016  ? Procedure: Abdominal Aortogram;  Surgeon: Conrad Kingston, MD;  Location: [facility];  Service: Cardiovascular;  Laterality: N/A;  ? PERIPHERAL VASCULAR CATHETERIZATION N/A 05/11/2016  ? Procedure: Lower Extremity Angiography;  Surgeon: Conrad Hotchkiss, MD;  Location: [facility];  Service: Cardiovascular;  Laterality: N/A;  ? POSTERIOR CERVICAL FUSION/FORAMINOTOMY N/A 07/05/2018  ? Procedure: Posterior cervical fusion with lateral mass fixation - Cervical five-Cervical six, Cervical six-Cervical seven;  Surgeon: Earnie Larsson, MD;  Location: [facility];  Service: Neurosurgery;  Laterality: N/A;  ? THROMBECTOMY FEMORAL ARTERY Right 07/17/2016  ? Procedure: THROMBECTOMY RIGHT FEMORAL ARTERY;  Surgeon: Conrad Marion, MD;  Location: [place];  Service: Vascular;  Laterality: Right;  ? TRANSTHORACIC ECHOCARDIOGRAM  06-20-2016   dr hilty  ? EF 55-60%,  grade 1 diastolic dysfunction,  mild TR  ? ? ?ROS:  ? ?General:  No weight loss, Fever, chills ? ?HEENT: No recent headaches, no nasal bleeding, no visual changes, no sore throat ? ?Neurologic: No dizziness, blackouts, seizures. No recent symptoms of stroke or mini- stroke. No recent episodes of slurred speech, or temporary blindness. ? ?Cardiac: No recent episodes of chest pain/pressure, no shortness of breath at rest.  No shortness of breath with exertion.  Denies history of atrial fibrillation or irregular heartbeat ? ?Vascular: No history of rest pain in feet.  No history of claudication.  No history of non-healing ulcer, No history of DVT  ? ?Pulmonary: No home oxygen, no productive cough, no hemoptysis,  No asthma or wheezing ? ?Musculoskeletal:  '[ ]'$  Arthritis, [x ] Low back pain,  '[ ]'$  Joint pain ? ?Hematologic:No history of hypercoagulable state.  No history of easy bleeding.  No history of anemia ? ?Gastrointestinal: No hematochezia or melena,  No gastroesophageal reflux, no trouble swallowing ? ?Urinary: '[ ]'$  chronic Kidney disease, '[ ]'$  on HD - '[ ]'$  MWF or '[ ]'$  TTHS, '[ ]'$  Burning with urination, '[ ]'$  Frequent urination, '[ ]'$  Difficulty urinating;  ? ?Skin: No rashes ? ?Psychological: No history of  anxiety,  No history of depression ? ?Social History ?Social History  ? ?Tobacco Use  ? Smoking status: Every Day  ?  Packs/day: 0.50  ?  Years: 33.00  ?  Pack years: 16.50  ?  Types: Cigarettes, E-cigarettes  ? Smokeless tobacco: Never  ?Vaping Use  ? Vaping Use: Never used  ?Substance Use Topics  ? Alcohol use: No  ?  Comment: Sober since 2017  ? Drug use: No  ? ? ?Family History ?Family History  ?Problem Relation Age of Onset  ? Lung cancer Father   ? ? ?Allergies ? ?Allergies  ?Allergen Reactions  ? Cymbalta [Duloxetine Hcl] Other (See Comments)  ?  Headaches  ? ? ? ?Current Outpatient Medications  ?Medication Sig Dispense Refill  ? carvedilol (COREG) 6.25 MG tablet TAKE 1 TABLET BY MOUTH TWICE DAILY 60 tablet 11  ? cilostazol (PLETAL) 50 MG  tablet TAKE 1 TABLET BY MOUTH TWICE DAILY 180 tablet 3  ? cyclobenzaprine (FLEXERIL) 10 MG tablet TAKE 1 TABLET BY MOUTH AT BEDTIME AS NEEDED FOR MUSCLE SPASMS 90 tablet 3  ? esomeprazole (NEXIUM) 20 MG cap

## 2021-09-26 ENCOUNTER — Other Ambulatory Visit: Payer: Self-pay | Admitting: *Deleted

## 2021-09-26 DIAGNOSIS — I779 Disorder of arteries and arterioles, unspecified: Secondary | ICD-10-CM

## 2021-09-26 DIAGNOSIS — I745 Embolism and thrombosis of iliac artery: Secondary | ICD-10-CM

## 2021-09-29 ENCOUNTER — Ambulatory Visit (INDEPENDENT_AMBULATORY_CARE_PROVIDER_SITE_OTHER): Payer: 59

## 2021-09-29 DIAGNOSIS — Z7901 Long term (current) use of anticoagulants: Secondary | ICD-10-CM | POA: Diagnosis not present

## 2021-09-29 LAB — POCT INR: INR: 2.5 (ref 2.0–3.0)

## 2021-09-29 NOTE — Patient Instructions (Addendum)
Pre visit review using our clinic review tool, if applicable. No additional management support is needed unless otherwise documented below in the visit note. ? ?Continue 1 tablet daily except take 1/2 tablet on Mondays and Thursdays. Recheck in 3 weeks, on 6/1.  ?

## 2021-09-29 NOTE — Progress Notes (Signed)
Received INR result of 2.5 from [employer].  ?Continue 1 tablet daily except take 1/2 tablet on Mondays and Thursdays. Recheck in 3 weeks, on 6/1.  LVM for pt with instructions to continue current dosing.  ?

## 2021-10-10 ENCOUNTER — Telehealth: Payer: Self-pay

## 2021-10-10 DIAGNOSIS — Z7901 Long term (current) use of anticoagulants: Secondary | ICD-10-CM

## 2021-10-10 NOTE — Telephone Encounter (Signed)
Pt LVM he will be having a procedure (epidural) on 6/9 and will need to hold warfarin and use lovenox bridge as he did 6 months ago for the same procedure.   Procedure: Epidural Date of Procedure: 10/28/21 Lovenox Bridge Needed: Yes CrCl: 107.8 mL/min Wt: 77.1 kg  Recommended Lovenox Dose: QD, 120 mg, 8 syringes needed for bridge  Bridge Schedule:  6/4: Take last dose of coumadin 6/5: NO coumadin, NO lovenox 6/6: NO coumadin, Lovenox AM only 6/7: NO coumadin, Lovenox AM only 6/8: NO coumadin, Lovenox AM only (BEFORE 7 AM)  6/9: Procedure Day; NO COUMADIN, NO LOVENOX  6/10: Take 9 mg (1 1/2 tablets) coumadin,  Lovenox AM only 6/11: Take 9 mg (1 1/2 tablets) coumadin, Lovenox AM only 6/12: Take 6 mg (1 tablet) coumadin, Lovenox AM only 6/13: Take 9 mg (1 1/2 tablets) coumadin, Lovenox AM only 6/14: Take 9 mg (1 1/2 tablets) coumadin, Lovenox AM only  6/15: Recheck INR; hold coumadin until after INR check

## 2021-10-11 ENCOUNTER — Other Ambulatory Visit (HOSPITAL_COMMUNITY): Payer: Self-pay

## 2021-10-11 MED ORDER — ENOXAPARIN SODIUM 120 MG/0.8ML IJ SOSY
PREFILLED_SYRINGE | INTRAMUSCULAR | 0 refills | Status: DC
  Filled 2021-10-11: qty 6.4, 8d supply, fill #0

## 2021-10-11 NOTE — Telephone Encounter (Signed)
Noted. Sending mychart msg with instructions and requesting where pt wants prescription for lovenox sent.

## 2021-10-12 ENCOUNTER — Ambulatory Visit (INDEPENDENT_AMBULATORY_CARE_PROVIDER_SITE_OTHER): Payer: 59 | Admitting: Family Medicine

## 2021-10-12 ENCOUNTER — Encounter: Payer: Self-pay | Admitting: Family Medicine

## 2021-10-12 ENCOUNTER — Other Ambulatory Visit (HOSPITAL_COMMUNITY): Payer: Self-pay

## 2021-10-12 ENCOUNTER — Other Ambulatory Visit: Payer: Self-pay | Admitting: Family Medicine

## 2021-10-12 VITALS — BP 130/80 | HR 79 | Temp 98.5°F | Ht 70.0 in | Wt 170.4 lb

## 2021-10-12 DIAGNOSIS — F17218 Nicotine dependence, cigarettes, with other nicotine-induced disorders: Secondary | ICD-10-CM

## 2021-10-12 DIAGNOSIS — R760 Raised antibody titer: Secondary | ICD-10-CM

## 2021-10-12 DIAGNOSIS — D751 Secondary polycythemia: Secondary | ICD-10-CM

## 2021-10-12 DIAGNOSIS — F339 Major depressive disorder, recurrent, unspecified: Secondary | ICD-10-CM | POA: Diagnosis not present

## 2021-10-12 DIAGNOSIS — E782 Mixed hyperlipidemia: Secondary | ICD-10-CM | POA: Diagnosis not present

## 2021-10-12 DIAGNOSIS — Z0000 Encounter for general adult medical examination without abnormal findings: Secondary | ICD-10-CM

## 2021-10-12 DIAGNOSIS — J439 Emphysema, unspecified: Secondary | ICD-10-CM | POA: Diagnosis not present

## 2021-10-12 DIAGNOSIS — Z23 Encounter for immunization: Secondary | ICD-10-CM

## 2021-10-12 DIAGNOSIS — I779 Disorder of arteries and arterioles, unspecified: Secondary | ICD-10-CM

## 2021-10-12 LAB — LIPID PANEL
Cholesterol: 125 mg/dL (ref 0–200)
HDL: 35.7 mg/dL — ABNORMAL LOW (ref >39.00)
LDL Cholesterol: 65 mg/dL (ref 0–99)
NonHDL: 89.54
Total CHOL/HDL Ratio: 4
Triglycerides: 124 mg/dL (ref 0.0–149.0)
VLDL: 24.8 mg/dL (ref 0.0–40.0)

## 2021-10-12 LAB — CBC WITH DIFFERENTIAL/PLATELET
Basophils Absolute: 0.1 10*3/uL (ref 0.0–0.1)
Basophils Relative: 1.2 % (ref 0.0–3.0)
Eosinophils Absolute: 0.5 10*3/uL (ref 0.0–0.7)
Eosinophils Relative: 4.4 % (ref 0.0–5.0)
HCT: 46.7 % (ref 39.0–52.0)
Hemoglobin: 15.7 g/dL (ref 13.0–17.0)
Lymphocytes Relative: 24.1 % (ref 12.0–46.0)
Lymphs Abs: 2.8 10*3/uL (ref 0.7–4.0)
MCHC: 33.6 g/dL (ref 30.0–36.0)
MCV: 99.5 fl (ref 78.0–100.0)
Monocytes Absolute: 0.8 10*3/uL (ref 0.1–1.0)
Monocytes Relative: 6.5 % (ref 3.0–12.0)
Neutro Abs: 7.4 10*3/uL (ref 1.4–7.7)
Neutrophils Relative %: 63.8 % (ref 43.0–77.0)
Platelets: 237 10*3/uL (ref 150.0–400.0)
RBC: 4.69 Mil/uL (ref 4.22–5.81)
RDW: 13.4 % (ref 11.5–15.5)
WBC: 11.7 10*3/uL — ABNORMAL HIGH (ref 4.0–10.5)

## 2021-10-12 LAB — COMPREHENSIVE METABOLIC PANEL
ALT: 15 U/L (ref 0–53)
AST: 17 U/L (ref 0–37)
Albumin: 4.3 g/dL (ref 3.5–5.2)
Alkaline Phosphatase: 93 U/L (ref 39–117)
BUN: 9 mg/dL (ref 6–23)
CO2: 27 mEq/L (ref 19–32)
Calcium: 9.3 mg/dL (ref 8.4–10.5)
Chloride: 100 mEq/L (ref 96–112)
Creatinine, Ser: 0.92 mg/dL (ref 0.40–1.50)
GFR: 94.47 mL/min (ref >60.00)
Glucose, Bld: 88 mg/dL (ref 70–99)
Potassium: 4.4 mEq/L (ref 3.5–5.1)
Sodium: 135 mEq/L (ref 135–145)
Total Bilirubin: 0.7 mg/dL (ref 0.2–1.2)
Total Protein: 6.9 g/dL (ref 6.0–8.3)

## 2021-10-12 LAB — IBC + FERRITIN
Ferritin: 63.4 ng/mL (ref 22.0–322.0)
Iron: 59 ug/dL (ref 42–165)
Saturation Ratios: 17.3 % — ABNORMAL LOW (ref 20.0–50.0)
TIBC: 340.2 ug/dL (ref 250.0–450.0)
Transferrin: 243 mg/dL (ref 212.0–360.0)

## 2021-10-12 NOTE — Addendum Note (Signed)
Addended by: Loura Back on: 10/12/2021 02:00 PM   Modules accepted: Orders

## 2021-10-12 NOTE — Patient Instructions (Addendum)
Please return in 6 months for hypertension follow up. And to receive your 2nd of 2 Shingrix vaccinations.  You were given your first today.  I will release your lab results to you on your MyChart account with further instructions. You may see the results before I do, but when I review them I will send you a message with my report or have my assistant call you if things need to be discussed. Please reply to my message with any questions. Thank you!   If you have any questions or concerns, please don't hesitate to send me a message via MyChart or call the office at [phone]. Thank you for visiting with [place] today! It's our pleasure caring for you.   Please do these things to maintain good health!  Exercise at least 30-45 minutes a day,  4-5 days a week.  Eat a low-fat diet with lots of fruits and vegetables, up to 7-9 servings per day. Drink plenty of water daily. Try to drink 8 8oz glasses per day. Seatbelts can save your life. Always wear your seatbelt. Place Smoke Detectors on every level of your home and check batteries every year. Eye Doctor - have an eye exam every 1-2 years Safe sex - use condoms to protect yourself from STDs if you could be exposed to these types of infections. Avoid heavy alcohol use. If you drink, keep it to less than 2 drinks/day and not every day. [place].  Choose someone you trust that could speak for you if you became unable to speak for yourself. Depression is common in our stressful world.If you're feeling down or losing interest in things you normally enjoy, please come in for a visit.

## 2021-10-12 NOTE — Progress Notes (Signed)
Subjective  Chief Complaint  Patient presents with   Annual Exam    Pt here for annual exam and is currently fasting    HPI: [name] is a 56 y.o. male who presents to [facility] today for a Male Wellness Visit. He also has the concerns and/or needs as listed above in the chief complaint. These will be addressed in addition to the Health Maintenance Visit.   Wellness Visit: annual visit with health maintenance review and exam   56 year old male physical: Health maintenance screenings are up-to-date.  Eligible for Shingrix.  Continues to smoke.  Precontemplative about quitting.  No new concerns  Body mass index is 24.45 kg/m. Wt Readings from Last 3 Encounters:  10/12/21 170 lb 6.4 oz (77.3 kg)  09/20/21 171 lb 4.8 oz (77.7 kg)  09/09/21 170 lb (77.1 kg)     Chronic disease management visit and/or acute problem visit: Chronic pain disorder, neck and.  Scheduled for epidural injection soon.  We will bridge with Lovenox.  Chronic Coumadin user for antiphospholipid syndrome and peripheral arterial occlusion disease.  Currently stable.  Reviewed vascular notes. PVCs: Had recent evaluation by cardiology, follow-up.  Stable off medications. Emphysema with chronic smoking.  Uses Brextri and feels it is beneficial.  No need for rescue inhaler.  No recent COPD flares. Chronic depression maintained on Prozac.  Good mood.  Since chronic pain has improved, mood has improved as well.  Remains active GERD is well controlled on Nexium.  No breakthrough symptoms.  Patient Active Problem List   Diagnosis Date Noted   Polycythemia 02/02/2019   Pulmonary emphysema ([place]) 08/04/2018   Chronic pain disorder 08/04/2018   Chronic right-sided low back pain with right-sided sciatica 11/19/2016   Long term (current) use of anticoagulants [Z79.01] 09/15/2016   Mixed hyperlipidemia 06/27/2016   Antiphospholipid antibody positive, requires lifetime Coumadin 06/27/2016    PAOD (peripheral arterrial occlusion disease) ([place]) 06/02/2016   Nicotine dependence 06/24/2014   Major depression, recurrent, chronic ([place]) 02/02/2019   Cervical pseudoarthrosis ([place] 07/05/2018   History of alcohol abuse, sober since 04/2016 07/11/2016   Gastroesophageal reflux disease, with small hiatal hernia, Rx Nexium    Frequent PVCs 02/02/2019   Degenerative arthritis of right knee 09/06/2017   History of neutrophilia 06/18/2017   Erectile dysfunction 11/19/2016   Hx of iron deficiency anemia due to chronic blood loss, hemorrhoids while on coumadin, s/p hemorrhoidectomy 11/2016    Anxiety disorder 02/02/2019   Arthritis of left acromioclavicular joint 09/29/2016   Health Maintenance  Topic Date Due   COVID-19 Vaccine (3 - Mixed Product risk series) 10/28/2021 (Originally 09/25/2019)   Zoster Vaccines- Shingrix (2 of 2) 12/07/2021   INFLUENZA VACCINE  12/20/2021   COLONOSCOPY (Pts 45-9yr Insurance coverage will need to be confirmed)  07/08/2026   TETANUS/TDAP  02/24/2027   Hepatitis C Screening  Completed   HIV Screening  Completed   HPV VACCINES  Aged Out   Immunization History  Administered Date(s) Administered   Influenza,inj,Quad PF,6+ Mos 07/05/2016, 02/23/2017, 02/08/2018, 01/30/2019, 02/21/2021   Influenza-Unspecified 07/05/2016, 02/08/2018, 01/30/2019, 02/27/2020   PFIZER(Purple Top)SARS-COV-2 Vaccination 08/17/2019, 08/28/2019   Tdap 02/23/2017   Zoster Recombinat (Shingrix) 10/12/2021   We updated and reviewed the patient's past history in detail and it is documented below. Allergies: Patient is allergic to cymbalta [duloxetine hcl]. Past Medical History  has a past medical history of Anticoagulated on Coumadin, Antiphospholipid antibody positive, followed by Dr. KIrene Limbo Hematology (06/27/2016), Anxiety with depression (06/24/2014),  Chronic back pain, ED (erectile dysfunction), GERD (gastroesophageal reflux disease), Hemorrhoids, internal, with bleeding, Hiatal hernia,  History of adenomatous polyp of colon, 07/08/16, tubular adenomas, History of atrial fibrillation, episode 08/2016, History of cardiomyopathy ([place]) (08/04/2018), History of DVT of lower extremity, Hyperlipidemia, Iron deficiency anemia due to chronic blood loss, thought to be from hemorrhoids while on coumadin, s/p iron infusion and blood transfusion x 2, 2018 (2018), Low testosterone in male (05/04/2017), 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 Past Surgical History Patient  has a past surgical history that includes Knee surgery (Right, 1983); Cardiac catheterization (N/A, 02/24/2016); Cardiac catheterization (Right, 02/24/2016); Cardiac catheterization (N/A, 05/11/2016); Cardiac catheterization (N/A, 05/11/2016); Esophagogastroduodenoscopy (N/A, 07/08/2016); Colonoscopy (N/A, 07/08/2016); Thrombectomy femoral artery (Right, 07/17/2016); Patch angioplasty (Right, 07/17/2016); Intraoperative arteriogram (Right, 07/17/2016); Angioplasty illiac artery (Right, 07/17/2016); Groin debridement (Right, 07/25/2016); Anterior cervical decomp/discectomy fusion (07/21/2010); transthoracic echocardiogram (06-20-2016   dr hilty); Cardiovascular stress test (06-14-2016   dr hilty); Hemorrhoid surgery (N/A, 12/07/2016); and Posterior cervical fusion/foraminotomy (N/A, 07/05/2018). Social History Patient  reports that he has been smoking cigarettes and e-cigarettes. He has a 16.50 pack-year smoking history. He has never used smokeless tobacco. He reports that he does not drink alcohol and does not use drugs. Family History family history includes Lung cancer in his father. Review of Systems: Constitutional: negative for fever or malaise Ophthalmic: negative for photophobia, double  vision or loss of vision Cardiovascular: negative for chest pain, dyspnea on exertion, or new LE swelling Respiratory: negative for SOB or persistent cough Gastrointestinal: negative for abdominal pain, change in bowel habits or melena Genitourinary: negative for dysuria or gross hematuria Musculoskeletal: negative for new gait disturbance or muscular weakness Integumentary: negative for new or persistent rashes Neurological: negative for TIA or stroke symptoms Psychiatric: negative for SI or delusions Allergic/Immunologic: negative for hives  Patient Care Team    Relationship Specialty Notifications Start End  Leamon Arnt, MD PCP - General Family Medicine  04/22/19   Constance Haw, MD PCP - Electrophysiology Cardiology Admissions 08/13/18   Pixie Casino, MD Consulting Physician Cardiology  05/12/16   Brunetta Genera, MD Consulting Physician Hematology  07/11/16   Conrad Glasgow, MD Consulting Physician Vascular Surgery  07/11/16   Earnie Larsson, MD Consulting Physician Neurosurgery  01/14/18   Doran Stabler, MD Consulting Physician Gastroenterology  05/13/18   Constance Haw, MD Consulting Physician Cardiology  04/22/19   Lyndal Pulley, DO Referring Physician Family Medicine  07/05/20    Objective  Vitals: BP 130/80   Pulse 79   Temp 98.5 F (36.9 C)   Ht '5\' 10"'$  (1.778 m)   Wt 170 lb 6.4 oz (77.3 kg)   SpO2 98%   BMI 24.45 kg/m  General:  Well developed, well nourished, no acute distress  Psych:  Alert and orientedx3,normal mood and affect HEENT:  Normocephalic, atraumatic, non-icteric sclera, PERRL, oropharynx is clear without mass or exudate, supple neck without adenopathy, mass or thyromegaly Cardiovascular:  Normal S1, S2, RRR without gallop, rub or murmur, nondisplaced PMI, +2 distal pulses in bilateral upper and lower extremities. Respiratory:  Good breath sounds bilaterally, CTAB with normal respiratory effort Gastrointestinal: normal bowel  sounds, soft, non-tender, no noted masses. No HSM MSK: no deformities, contusions. Joints are without erythema or swelling. Spine and CVA  region are nontender Skin:  Warm, no rashes or suspicious lesions noted Neurologic:    Mental status is normal. CN 2-11 are normal. Gross motor and sensory exams are normal. Stable gait. No tremor GU: No inguinal hernias or adenopathy are appreciated bilaterally   Assessment  1. Annual physical exam   2. Need for shingles vaccine   3. PAOD (peripheral arterrial occlusion disease) ([place])   4. Cigarette nicotine dependence with other nicotine-induced disorder   5. Mixed hyperlipidemia   6. Antiphospholipid antibody positive, requires lifetime Coumadin   7. Pulmonary emphysema, unspecified emphysema type ([place])   8. Polycythemia   9. Major depression, recurrent, chronic ([place])      Plan  Male Wellness Visit: Age appropriate Health Maintenance and Prevention measures were discussed with patient. Included topics are cancer screening recommendations, ways to keep healthy (see AVS) including dietary and exercise recommendations, regular eye and dental care, use of seat belts, and avoidance of moderate alcohol use and tobacco use.  BMI: discussed patient's BMI and encouraged positive lifestyle modifications to help get to or maintain a target BMI. HM needs and immunizations were addressed and ordered. See below for orders. See HM and immunization section for updates.  First Shingrix given today Routine labs and screening tests ordered including cmp, cbc and lipids where appropriate. Discussed recommendations regarding Vit D and calcium supplementation (see AVS)  Chronic disease f/u and/or acute problem visit: (deemed necessary to be done in addition to the wellness visit): Vascular disease on chronic Coumadin: Stable.  Continue with Coumadin clinic.  Vascular surgery annually Hyperlipidemia on Crestor 20: Recheck lipids today.  Goal LDL less than 70 Depression is  well controlled on Prozac 20 daily GERD is well controlled on Nexium 20 daily Monitoring CBC for history of polycythemia Encouraged smoking cessation.  Follow up: 6 months to recheck blood pressure and get second Shingrix vaccine [place] side effects, risks, benefits, and alternatives for medications and treatment plan prescribed today were discussed, and the patient expressed understanding of the given instructions. Patient is instructed to call or message via MyChart if he/she has any questions or concerns regarding our treatment plan. No barriers to understanding were identified. We discussed Red Flag symptoms and signs in detail. Patient expressed understanding regarding what to do in case of urgent or emergency type symptoms.  Medication list was reconciled, printed and provided to the patient in AVS. Patient instructions and summary information was reviewed with the patient as documented in the AVS. This note was prepared with assistance of Dragon voice recognition software. Occasional wrong-word or sound-a-like substitutions may have occurred due to the inherent limitations of voice recognition software  This visit occurred during the SARS-CoV-2 public health emergency.  Safety protocols were in place, including screening questions prior to the visit, additional usage of staff PPE, and extensive cleaning of exam room while observing appropriate contact time as indicated for disinfecting solutions.   Orders Placed This Encounter  Procedures   Varicella-zoster vaccine IM (Shingrix)   No orders of the defined types were placed in this encounter.

## 2021-10-13 ENCOUNTER — Other Ambulatory Visit (HOSPITAL_COMMUNITY): Payer: Self-pay

## 2021-10-13 MED ORDER — ROSUVASTATIN CALCIUM 10 MG PO TABS
ORAL_TABLET | Freq: Every day | ORAL | 3 refills | Status: DC
  Filled 2021-10-13: qty 90, 90d supply, fill #0
  Filled 2022-01-08: qty 90, 90d supply, fill #1
  Filled 2022-04-09: qty 90, 90d supply, fill #2
  Filled 2022-07-09: qty 90, 90d supply, fill #3

## 2021-10-19 ENCOUNTER — Encounter: Payer: Self-pay | Admitting: Family Medicine

## 2021-10-19 LAB — POCT INR

## 2021-10-20 ENCOUNTER — Ambulatory Visit (INDEPENDENT_AMBULATORY_CARE_PROVIDER_SITE_OTHER): Payer: 59

## 2021-10-20 DIAGNOSIS — Z7901 Long term (current) use of anticoagulants: Secondary | ICD-10-CM

## 2021-10-20 LAB — POCT INR: INR: 2.4 (ref 2.0–3.0)

## 2021-10-20 NOTE — Progress Notes (Signed)
Pt is scheduled for epidural on 6/9 and will be on a lovenox bridge. Pt has received instructions for bridge already.  Received INR result of 2.4 from [employer].  Increase dose to take 1 tablet today and then continue 1 tablet daily except take 1/2 tablet on Mondays and Thursdays. Recheck on 6/15.  LVM for pt with instructions to continue current dosing.

## 2021-10-20 NOTE — Patient Instructions (Signed)
Pre visit review using our clinic review tool, if applicable. No additional management support is needed unless otherwise documented below in the visit note. 

## 2021-10-27 ENCOUNTER — Telehealth: Payer: Self-pay

## 2021-10-27 DIAGNOSIS — Z7901 Long term (current) use of anticoagulants: Secondary | ICD-10-CM | POA: Diagnosis not present

## 2021-10-27 DIAGNOSIS — R760 Raised antibody titer: Secondary | ICD-10-CM | POA: Diagnosis not present

## 2021-10-27 LAB — POCT INR: INR: 1.4 — AB (ref <=1.20)

## 2021-10-27 NOTE — Telephone Encounter (Signed)
Pt is on a lovenox bridge and for epidural tomorrow. They request he test his INR the day before the procedure. He called in a 1.4 for his INR. No changes will be made to dosing. He will continue following lovenox bridge instructions.

## 2021-10-28 ENCOUNTER — Ambulatory Visit
Admission: RE | Admit: 2021-10-28 | Discharge: 2021-10-28 | Disposition: A | Discharge status: Home / Self Care | Payer: 59 | Source: Ambulatory Visit | Attending: Family Medicine | Admitting: Family Medicine

## 2021-10-28 DIAGNOSIS — M5416 Radiculopathy, lumbar region: Secondary | ICD-10-CM

## 2021-10-28 DIAGNOSIS — M47817 Spondylosis without myelopathy or radiculopathy, lumbosacral region: Secondary | ICD-10-CM | POA: Diagnosis not present

## 2021-10-28 MED ORDER — IOPAMIDOL (ISOVUE-M 200) INJECTION 41%
1.0000 mL | Freq: Once | INTRAMUSCULAR | Status: AC
  Administered 2021-10-28: 1 mL via EPIDURAL

## 2021-10-28 MED ORDER — METHYLPREDNISOLONE ACETATE 40 MG/ML INJ SUSP (RADIOLOG
80.0000 mg | Freq: Once | INTRAMUSCULAR | Status: AC
  Administered 2021-10-28: 80 mg via EPIDURAL

## 2021-10-28 NOTE — Discharge Instructions (Signed)
Post Procedure Spinal Discharge Instruction Sheet  You may resume a regular diet and any medications that you routinely take (including pain medications) unless otherwise noted by MD.  No driving day of procedure.  Light activity throughout the rest of the day.  Do not do any strenuous work, exercise, bending or lifting.  The day following the procedure, you can resume normal physical activity but you should refrain from exercising or physical therapy for at least three days thereafter.  You may apply ice to the injection site, 20 minutes on, 20 minutes off, as needed. Do not apply ice directly to skin.    Common Side Effects:  Headaches- take your usual medications as directed by your physician.  Increase your fluid intake.  Caffeinated beverages may be helpful.  Lie flat in bed until your headache resolves.  Restlessness or inability to sleep- you may have trouble sleeping for the next few days.  Ask your referring physician if you need any medication for sleep.  Facial flushing or redness- should subside within a few days.  Increased pain- a temporary increase in pain a day or two following your procedure is not unusual.  Take your pain medication as prescribed by your referring physician.  Leg cramps  Please contact our office at [phone] for the following symptoms: Fever greater than 100 degrees. Headaches unresolved with medication after 2-3 days. Increased swelling, pain, or redness at injection site.   Thank you for visiting [facility] today.    YOU MAY RESUME YOUR PLETAL 4 HOURS AFTER PROCEDURE   YOU MAY RESUME YOUR COUMADIN 12 HOURS AFTER PROCEDURE

## 2021-11-03 ENCOUNTER — Ambulatory Visit (INDEPENDENT_AMBULATORY_CARE_PROVIDER_SITE_OTHER): Payer: 59

## 2021-11-03 DIAGNOSIS — Z7901 Long term (current) use of anticoagulants: Secondary | ICD-10-CM | POA: Diagnosis not present

## 2021-11-03 LAB — POCT INR: INR: 3.8 — AB (ref 2.0–3.0)

## 2021-11-03 NOTE — Progress Notes (Signed)
Pt had recent epidural procedure and had a 5 day hold of warfarin and lovenox bridge and booster warfarin dosing after the procedure.  Pt also reported he has lost about 20# in the last six months.  Received INR result of 3.8 from [employer].  Hold dose today and then continue 1 tablet daily except take 1/2 tablet on Mondays and Thursdays. Stop taking lovenox. Recheck on 6/28.  LVM for pt with instructions to continue current dosing.

## 2021-11-03 NOTE — Patient Instructions (Addendum)
Pre visit review using our clinic review tool, if applicable. No additional management support is needed unless otherwise documented below in the visit note.  Hold dose today and then continue 1 tablet daily except take 1/2 tablet on Mondays and Thursdays. Stop taking lovenox. Recheck on 6/28.

## 2021-11-04 ENCOUNTER — Other Ambulatory Visit (HOSPITAL_COMMUNITY): Payer: Self-pay

## 2021-11-14 ENCOUNTER — Other Ambulatory Visit (HOSPITAL_COMMUNITY): Payer: Self-pay

## 2021-11-14 ENCOUNTER — Other Ambulatory Visit: Payer: Self-pay | Admitting: Family Medicine

## 2021-11-14 DIAGNOSIS — D6859 Other primary thrombophilia: Secondary | ICD-10-CM

## 2021-11-15 ENCOUNTER — Other Ambulatory Visit (HOSPITAL_COMMUNITY): Payer: Self-pay

## 2021-11-15 MED ORDER — WARFARIN SODIUM 6 MG PO TABS
ORAL_TABLET | ORAL | 1 refills | Status: DC
  Filled 2021-11-15: qty 68, 70d supply, fill #0
  Filled 2022-01-22: qty 68, 70d supply, fill #1
  Filled 2022-03-29: qty 54, 63d supply, fill #2

## 2021-11-16 ENCOUNTER — Ambulatory Visit (INDEPENDENT_AMBULATORY_CARE_PROVIDER_SITE_OTHER): Payer: 59

## 2021-11-16 ENCOUNTER — Other Ambulatory Visit: Payer: Self-pay | Admitting: Family Medicine

## 2021-11-16 DIAGNOSIS — Z7901 Long term (current) use of anticoagulants: Secondary | ICD-10-CM | POA: Diagnosis not present

## 2021-11-16 LAB — POCT INR: INR: 2.4 (ref 2.0–3.0)

## 2021-11-16 NOTE — Patient Instructions (Addendum)
Pre visit review using our clinic review tool, if applicable. No additional management support is needed unless otherwise documented below in the visit note.  Increase dose to take 1 1/2 tablets today and then continue 1 tablet daily except take 1/2 tablet on Mondays and Thursdays.

## 2021-11-16 NOTE — Progress Notes (Signed)
Received INR result of 2.4 from [employer].  Increase dose to take 1 1/2 tablets today and then continue 1 tablet daily except take 1/2 tablet on Mondays and Thursdays. LVM with dosing instructions and sent mychart msg.

## 2021-11-16 NOTE — Telephone Encounter (Signed)
Received INR result of 2.4 from [employer].  Increase dose to take 1 1/2 tablets today and then continue 1 tablet daily except take 1/2 tablet on Mondays and Thursdays. LVM with dosing instructions and sent mychart msg.

## 2021-11-17 ENCOUNTER — Other Ambulatory Visit (HOSPITAL_COMMUNITY): Payer: Self-pay

## 2021-11-17 MED ORDER — CYCLOBENZAPRINE HCL 10 MG PO TABS
ORAL_TABLET | ORAL | 3 refills | Status: DC
  Filled 2021-11-17: qty 90, 90d supply, fill #0
  Filled 2022-04-21: qty 90, 90d supply, fill #1
  Filled 2022-07-06: qty 90, 90d supply, fill #2
  Filled 2022-11-07: qty 90, 90d supply, fill #3

## 2021-11-19 ENCOUNTER — Other Ambulatory Visit (HOSPITAL_COMMUNITY): Payer: Self-pay

## 2021-11-24 ENCOUNTER — Other Ambulatory Visit (HOSPITAL_COMMUNITY): Payer: Self-pay

## 2021-11-24 ENCOUNTER — Other Ambulatory Visit: Payer: Self-pay | Admitting: Family Medicine

## 2021-11-24 DIAGNOSIS — I739 Peripheral vascular disease, unspecified: Secondary | ICD-10-CM

## 2021-11-25 ENCOUNTER — Other Ambulatory Visit (HOSPITAL_COMMUNITY): Payer: Self-pay

## 2021-11-25 MED ORDER — CILOSTAZOL 50 MG PO TABS
ORAL_TABLET | Freq: Two times a day (BID) | ORAL | 3 refills | Status: DC
  Filled 2021-11-25: qty 180, 90d supply, fill #0
  Filled 2022-02-27: qty 180, 90d supply, fill #1
  Filled 2022-06-04: qty 180, 90d supply, fill #2
  Filled 2022-09-04: qty 180, 90d supply, fill #3

## 2021-11-28 ENCOUNTER — Other Ambulatory Visit (HOSPITAL_COMMUNITY): Payer: Self-pay

## 2021-11-30 ENCOUNTER — Ambulatory Visit (INDEPENDENT_AMBULATORY_CARE_PROVIDER_SITE_OTHER): Payer: 59

## 2021-11-30 DIAGNOSIS — Z7901 Long term (current) use of anticoagulants: Secondary | ICD-10-CM

## 2021-11-30 LAB — POCT INR: INR: 1.9 — AB (ref 2.0–3.0)

## 2021-11-30 NOTE — Patient Instructions (Addendum)
Pre visit review using our clinic review tool, if applicable. No additional management support is needed unless otherwise documented below in the visit note.  Increase dose to take 1 1/2 tablets and increase dose tomorrow to take 1 tablet and then change weekly dose to take 1 tablet daily except take 1/2 tablet on Mondays.

## 2021-11-30 NOTE — Progress Notes (Signed)
Pt has been exercising more and has been eating more due to the exercise. He reports he is eating more greens.  Increase dose to take 1 1/2 tablets and increase dose tomorrow to take 1 tablet and then change weekly dose to take 1 tablet daily except take 1/2 tablet on Mondays. Advised pt of change in dosing. Pt verbalized understanding.

## 2021-12-05 ENCOUNTER — Other Ambulatory Visit (HOSPITAL_COMMUNITY): Payer: Self-pay

## 2021-12-14 ENCOUNTER — Ambulatory Visit (INDEPENDENT_AMBULATORY_CARE_PROVIDER_SITE_OTHER): Payer: 59

## 2021-12-14 DIAGNOSIS — Z7901 Long term (current) use of anticoagulants: Secondary | ICD-10-CM | POA: Diagnosis not present

## 2021-12-14 DIAGNOSIS — R760 Raised antibody titer: Secondary | ICD-10-CM | POA: Diagnosis not present

## 2021-12-14 LAB — POCT INR: INR: 1.6 — AB (ref 2.0–3.0)

## 2021-12-14 NOTE — Patient Instructions (Addendum)
Pre visit review using our clinic review tool, if applicable. No additional management support is needed unless otherwise documented below in the visit note.  Increase dose to take 1 1/2 tablets and increase dose tomorrow to take 1 1/2 tablets and then change weekly dose to take 1 tablet daily except take 1 1/2 tablets on Mondays. Recheck in 1 week.

## 2021-12-14 NOTE — Progress Notes (Signed)
Received INR result of 1.6 from [employer].  Pt has been exercising more and has been eating more due to the exercise. He reports he is eating more greens.  Increase dose to take 1 1/2 tablets and increase dose tomorrow to take 1 1/2 tablets and then change weekly dose to take 1 tablet daily except take 1 1/2 tablets on Mondays. Recheck in 1 week. Advised pt of change in dosing. Pt verbalized understanding.

## 2021-12-14 NOTE — Progress Notes (Signed)
I have reviewed and agree with note, evaluation, plan.   Atiyana Welte, MD  

## 2021-12-21 ENCOUNTER — Ambulatory Visit (INDEPENDENT_AMBULATORY_CARE_PROVIDER_SITE_OTHER): Payer: 59

## 2021-12-21 DIAGNOSIS — Z7901 Long term (current) use of anticoagulants: Secondary | ICD-10-CM | POA: Diagnosis not present

## 2021-12-21 LAB — POCT INR: INR: 2.7 (ref 2.0–3.0)

## 2021-12-21 NOTE — Patient Instructions (Addendum)
Pre visit review using our clinic review tool, if applicable. No additional management support is needed unless otherwise documented below in the visit note.  Continue 1 tablet daily except take 1 1/2 tablets on Mondays. Recheck in 2 week.

## 2021-12-21 NOTE — Progress Notes (Signed)
Received INR result of 2.7 from [employer].  Pt has been exercising more and has been eating more due to the exercise. He reports he is eating more greens.  Continue 1 tablet daily except take 1 1/2 tablets on Mondays. Recheck in 2 week. Advised pt of change in dosing. Pt verbalized understanding.

## 2022-01-04 ENCOUNTER — Ambulatory Visit (INDEPENDENT_AMBULATORY_CARE_PROVIDER_SITE_OTHER): Payer: 59

## 2022-01-04 DIAGNOSIS — Z7901 Long term (current) use of anticoagulants: Secondary | ICD-10-CM | POA: Diagnosis not present

## 2022-01-04 LAB — POCT INR: INR: 3.2 — AB (ref 2.0–3.0)

## 2022-01-04 NOTE — Progress Notes (Signed)
Received INR result of 3.2 from [employer].  Pt has been exercising more and has been eating more due to the exercise. He reports he is eating more greens.  Decrease dose today to take 1/2 tablet and then continue 1 tablet daily except take 1 1/2 tablets on Mondays. Recheck in 2 week. Advised pt of change in dosing. Pt verbalized understanding.

## 2022-01-04 NOTE — Patient Instructions (Addendum)
Pre visit review using our clinic review tool, if applicable. No additional management support is needed unless otherwise documented below in the visit note.  Decrease dose today to take 1/2 tablet and then continue 1 tablet daily except take 1 1/2 tablets on Mondays. Recheck in 2 week, on 8/30.

## 2022-01-08 ENCOUNTER — Other Ambulatory Visit: Payer: Self-pay | Admitting: Cardiology

## 2022-01-09 ENCOUNTER — Other Ambulatory Visit (HOSPITAL_COMMUNITY): Payer: Self-pay

## 2022-01-09 MED ORDER — CARVEDILOL 6.25 MG PO TABS
ORAL_TABLET | Freq: Two times a day (BID) | ORAL | 7 refills | Status: DC
  Filled 2022-01-09: qty 60, 30d supply, fill #0
  Filled 2022-02-06: qty 60, 30d supply, fill #1
  Filled 2022-03-12: qty 60, 30d supply, fill #2
  Filled 2022-04-09: qty 60, 30d supply, fill #3
  Filled 2022-05-15: qty 60, 30d supply, fill #4
  Filled 2022-06-19: qty 60, 30d supply, fill #5
  Filled 2022-07-17: qty 60, 30d supply, fill #6
  Filled 2022-08-20: qty 60, 30d supply, fill #7

## 2022-01-13 ENCOUNTER — Other Ambulatory Visit: Payer: Self-pay

## 2022-01-13 ENCOUNTER — Other Ambulatory Visit (HOSPITAL_COMMUNITY): Payer: Self-pay

## 2022-01-13 ENCOUNTER — Encounter: Payer: Self-pay | Admitting: Family Medicine

## 2022-01-13 DIAGNOSIS — F339 Major depressive disorder, recurrent, unspecified: Secondary | ICD-10-CM

## 2022-01-13 DIAGNOSIS — K219 Gastro-esophageal reflux disease without esophagitis: Secondary | ICD-10-CM

## 2022-01-13 MED ORDER — FLUOXETINE HCL 20 MG PO CAPS
20.0000 mg | ORAL_CAPSULE | Freq: Every day | ORAL | 1 refills | Status: DC
  Filled 2022-01-13: qty 90, 90d supply, fill #0
  Filled 2022-07-02: qty 90, 90d supply, fill #1

## 2022-01-18 ENCOUNTER — Ambulatory Visit (INDEPENDENT_AMBULATORY_CARE_PROVIDER_SITE_OTHER): Payer: 59

## 2022-01-18 DIAGNOSIS — Z7901 Long term (current) use of anticoagulants: Secondary | ICD-10-CM | POA: Diagnosis not present

## 2022-01-18 LAB — POCT INR: INR: 3.3 — AB (ref 2.0–3.0)

## 2022-01-18 NOTE — Progress Notes (Signed)
Received INR result of 3.3 from [employer].  Pt has been exercising more and has been eating more due to the exercise. He reports he is eating more greens.  Hold reduced dose today to take 1/2 tablet and the change weekly dose to take 1 tablet daily. Recheck in 2 week, on 9/13. Sent mychart msg with dosing instructions.

## 2022-01-18 NOTE — Patient Instructions (Addendum)
Pre visit review using our clinic review tool, if applicable. No additional management support is needed unless otherwise documented below in the visit note.  Hold reduced dose today to take 1/2 tablet and the change weekly dose to take 1 tablet daily. Recheck in 2 week, on 9/13.

## 2022-01-23 ENCOUNTER — Other Ambulatory Visit (HOSPITAL_COMMUNITY): Payer: Self-pay

## 2022-01-25 ENCOUNTER — Other Ambulatory Visit (HOSPITAL_COMMUNITY): Payer: Self-pay

## 2022-01-26 ENCOUNTER — Other Ambulatory Visit (HOSPITAL_COMMUNITY): Payer: Self-pay

## 2022-01-28 ENCOUNTER — Other Ambulatory Visit (HOSPITAL_COMMUNITY): Payer: Self-pay

## 2022-02-01 ENCOUNTER — Ambulatory Visit (INDEPENDENT_AMBULATORY_CARE_PROVIDER_SITE_OTHER): Payer: 59

## 2022-02-01 ENCOUNTER — Other Ambulatory Visit (HOSPITAL_COMMUNITY): Payer: Self-pay

## 2022-02-01 DIAGNOSIS — Z7901 Long term (current) use of anticoagulants: Secondary | ICD-10-CM

## 2022-02-01 DIAGNOSIS — R760 Raised antibody titer: Secondary | ICD-10-CM | POA: Diagnosis not present

## 2022-02-01 LAB — POCT INR: INR: 2.6 (ref 2.0–3.0)

## 2022-02-01 NOTE — Patient Instructions (Addendum)
Pre visit review using our clinic review tool, if applicable. No additional management support is needed unless otherwise documented below in the visit note.   Continue to take 1 tablet daily. Recheck in 2 weeks, on 9/27.

## 2022-02-01 NOTE — Progress Notes (Signed)
Received INR result of 2.6 from [employer].  Continue to take 1 tablet daily. Recheck in 2 week, on 9/27.  Sent mychart msg with dosing instructions.

## 2022-02-06 ENCOUNTER — Other Ambulatory Visit (HOSPITAL_COMMUNITY): Payer: Self-pay

## 2022-02-13 ENCOUNTER — Encounter: Payer: Self-pay | Admitting: *Deleted

## 2022-02-15 ENCOUNTER — Ambulatory Visit (INDEPENDENT_AMBULATORY_CARE_PROVIDER_SITE_OTHER): Payer: 59

## 2022-02-15 DIAGNOSIS — Z7901 Long term (current) use of anticoagulants: Secondary | ICD-10-CM | POA: Diagnosis not present

## 2022-02-15 LAB — POCT INR: INR: 2.5 (ref 2.0–3.0)

## 2022-02-15 NOTE — Progress Notes (Signed)
Received INR result of 2.5 from [employer].   Continue to take 1 tablet daily. Recheck in 2 weeks, on 10/11.   Sent mychart msg with dosing instructions.

## 2022-02-15 NOTE — Patient Instructions (Addendum)
Continue to take 1 tablet daily and recheck in two weeks, on Oct 11.  Call me if you have any questions  [phone].

## 2022-02-28 ENCOUNTER — Other Ambulatory Visit (HOSPITAL_COMMUNITY): Payer: Self-pay

## 2022-03-01 ENCOUNTER — Ambulatory Visit (INDEPENDENT_AMBULATORY_CARE_PROVIDER_SITE_OTHER): Payer: 59

## 2022-03-01 ENCOUNTER — Other Ambulatory Visit (HOSPITAL_COMMUNITY): Payer: Self-pay

## 2022-03-01 DIAGNOSIS — Z7901 Long term (current) use of anticoagulants: Secondary | ICD-10-CM

## 2022-03-01 LAB — POCT INR: INR: 2.4 (ref 2.0–3.0)

## 2022-03-01 NOTE — Progress Notes (Signed)
Received INR result of 2.4 from [employer].   Increase dose today to 1 1/2 tablets (9 mg total) then continue to take 1 tablet daily. Recheck in 2 weeks, on 10/25.   Sent mychart message with dosing instructions.

## 2022-03-01 NOTE — Patient Instructions (Signed)
Increase dose today to 1 1/2 tablets (9 mg total) then continue to take 1 tablet daily. Recheck in 2 weeks, on 10/25.

## 2022-03-13 ENCOUNTER — Other Ambulatory Visit (HOSPITAL_COMMUNITY): Payer: Self-pay

## 2022-03-15 ENCOUNTER — Ambulatory Visit (INDEPENDENT_AMBULATORY_CARE_PROVIDER_SITE_OTHER): Payer: 59

## 2022-03-15 DIAGNOSIS — Z7901 Long term (current) use of anticoagulants: Secondary | ICD-10-CM | POA: Diagnosis not present

## 2022-03-15 LAB — POCT INR: INR: 2.6 (ref 2.0–3.0)

## 2022-03-15 NOTE — Patient Instructions (Signed)
Continue to take 1 tablet (6 mg) daily. Recheck in 2 weeks, on 11/8.

## 2022-03-15 NOTE — Progress Notes (Signed)
Received INR result of 2.6 from [employer].   Continue to take 1 tablet (6 mg) daily. Recheck in 2 weeks, on 11/8.  Sent mychart msg with dosing instructions.

## 2022-03-17 ENCOUNTER — Other Ambulatory Visit (INDEPENDENT_AMBULATORY_CARE_PROVIDER_SITE_OTHER): Payer: 59

## 2022-03-17 DIAGNOSIS — M2550 Pain in unspecified joint: Secondary | ICD-10-CM | POA: Diagnosis not present

## 2022-03-17 LAB — IBC PANEL
Iron: 66 ug/dL (ref 42–165)
Saturation Ratios: 18.9 % — ABNORMAL LOW (ref 20.0–50.0)
TIBC: 350 ug/dL (ref 250.0–450.0)
Transferrin: 250 mg/dL (ref 212.0–360.0)

## 2022-03-17 LAB — CBC WITH DIFFERENTIAL/PLATELET
Basophils Absolute: 0.1 10*3/uL (ref 0.0–0.1)
Basophils Relative: 0.6 % (ref 0.0–3.0)
Eosinophils Absolute: 0.5 10*3/uL (ref 0.0–0.7)
Eosinophils Relative: 3.6 % (ref 0.0–5.0)
HCT: 44.6 % (ref 39.0–52.0)
Hemoglobin: 15.1 g/dL (ref 13.0–17.0)
Lymphocytes Relative: 21.7 % (ref 12.0–46.0)
Lymphs Abs: 2.9 10*3/uL (ref 0.7–4.0)
MCHC: 33.8 g/dL (ref 30.0–36.0)
MCV: 99.3 fl (ref 78.0–100.0)
Monocytes Absolute: 0.7 10*3/uL (ref 0.1–1.0)
Monocytes Relative: 5.3 % (ref 3.0–12.0)
Neutro Abs: 9.3 10*3/uL — ABNORMAL HIGH (ref 1.4–7.7)
Neutrophils Relative %: 68.8 % (ref 43.0–77.0)
Platelets: 237 10*3/uL (ref 150.0–400.0)
RBC: 4.5 Mil/uL (ref 4.22–5.81)
RDW: 13.4 % (ref 11.5–15.5)
WBC: 13.5 10*3/uL — ABNORMAL HIGH (ref 4.0–10.5)

## 2022-03-17 LAB — FERRITIN: Ferritin: 78.3 ng/mL (ref 22.0–322.0)

## 2022-03-29 DIAGNOSIS — R760 Raised antibody titer: Secondary | ICD-10-CM | POA: Diagnosis not present

## 2022-03-29 DIAGNOSIS — Z7901 Long term (current) use of anticoagulants: Secondary | ICD-10-CM | POA: Diagnosis not present

## 2022-03-29 LAB — POCT INR: INR: 2.3 (ref 2.0–3.0)

## 2022-03-30 ENCOUNTER — Other Ambulatory Visit (HOSPITAL_COMMUNITY): Payer: Self-pay

## 2022-03-30 ENCOUNTER — Ambulatory Visit (INDEPENDENT_AMBULATORY_CARE_PROVIDER_SITE_OTHER): Payer: 59

## 2022-03-30 DIAGNOSIS — Z7901 Long term (current) use of anticoagulants: Secondary | ICD-10-CM | POA: Diagnosis not present

## 2022-03-30 NOTE — Patient Instructions (Signed)
Increase dose today to 1 1/2 tablets (9 mg total) then continue to take 1 tablet (6 mg) daily. Recheck in 2 weeks, on 11/22.

## 2022-03-30 NOTE — Progress Notes (Signed)
Received INR result of 2.3 from [employer].   Increase dose today to 1 1/2 tablets then continue to take 1 tablet (6 mg) daily. Recheck in 2 weeks, on 11/22.  Sent mychart msg with dosing instructions.

## 2022-04-09 ENCOUNTER — Other Ambulatory Visit (HOSPITAL_COMMUNITY): Payer: Self-pay

## 2022-04-10 ENCOUNTER — Other Ambulatory Visit (HOSPITAL_COMMUNITY): Payer: Self-pay

## 2022-04-12 ENCOUNTER — Ambulatory Visit (INDEPENDENT_AMBULATORY_CARE_PROVIDER_SITE_OTHER): Payer: 59

## 2022-04-12 DIAGNOSIS — Z7901 Long term (current) use of anticoagulants: Secondary | ICD-10-CM | POA: Diagnosis not present

## 2022-04-12 LAB — POCT INR: INR: 2.4 (ref 2.0–3.0)

## 2022-04-12 NOTE — Patient Instructions (Signed)
Increase dose today to 1 1/2 tablets then continue to take 1 tablet (6 mg) daily. Recheck in 2 weeks, on 12/6.

## 2022-04-12 NOTE — Progress Notes (Signed)
Received INR result of 2.4 from [employer].   Increase dose today to 1 1/2 tablets then continue to take 1 tablet (6 mg) daily. Recheck in 2 weeks, on 12/6.  Sent mychart message with dosing instructions.

## 2022-04-16 ENCOUNTER — Other Ambulatory Visit (HOSPITAL_COMMUNITY): Payer: Self-pay

## 2022-04-17 ENCOUNTER — Other Ambulatory Visit (HOSPITAL_COMMUNITY): Payer: Self-pay

## 2022-04-18 ENCOUNTER — Other Ambulatory Visit (HOSPITAL_COMMUNITY): Payer: Self-pay

## 2022-04-19 ENCOUNTER — Other Ambulatory Visit (HOSPITAL_COMMUNITY): Payer: Self-pay

## 2022-04-20 ENCOUNTER — Other Ambulatory Visit (HOSPITAL_COMMUNITY): Payer: Self-pay

## 2022-04-20 ENCOUNTER — Other Ambulatory Visit: Payer: Self-pay

## 2022-04-20 ENCOUNTER — Telehealth: Payer: Self-pay | Admitting: Family Medicine

## 2022-04-20 DIAGNOSIS — M5416 Radiculopathy, lumbar region: Secondary | ICD-10-CM

## 2022-04-20 MED ORDER — PREDNISONE 50 MG PO TABS
ORAL_TABLET | ORAL | 0 refills | Status: DC
  Filled 2022-04-20: qty 5, 5d supply, fill #0

## 2022-04-20 NOTE — Telephone Encounter (Signed)
Epidural ordered and medication called in.

## 2022-04-20 NOTE — Telephone Encounter (Signed)
Patient is having a lot of reoccurring back pain.  Asking if it would be possible for Dr Tamala Julian to send in steroids for him and also order another epidural. - is there a time frame needed in between the two?   Has tried muscle relaxer's and ice with no relief.   Pharmacy: [facility]

## 2022-04-22 ENCOUNTER — Other Ambulatory Visit (HOSPITAL_COMMUNITY): Payer: Self-pay

## 2022-04-28 ENCOUNTER — Ambulatory Visit (INDEPENDENT_AMBULATORY_CARE_PROVIDER_SITE_OTHER): Payer: 59

## 2022-04-28 ENCOUNTER — Ambulatory Visit: Payer: 59 | Admitting: Family Medicine

## 2022-04-28 ENCOUNTER — Telehealth: Payer: Self-pay

## 2022-04-28 DIAGNOSIS — Z7901 Long term (current) use of anticoagulants: Secondary | ICD-10-CM | POA: Diagnosis not present

## 2022-04-28 LAB — POCT INR: INR: 3.5 — AB (ref 2.0–3.0)

## 2022-04-28 NOTE — Telephone Encounter (Signed)
Pt is due to test INR at home. LVM for pt to test today or Monday.

## 2022-04-28 NOTE — Patient Instructions (Addendum)
Pre visit review using our clinic review tool, if applicable. No additional management support is needed unless otherwise documented below in the visit note.  Hold dose today and then continue to take 1 tablet (6 mg) daily. Recheck in 2 weeks, on 12/20.

## 2022-04-28 NOTE — Progress Notes (Signed)
Received INR result of 3.0 from [employer].   Hold dose today and then continue to take 1 tablet (6 mg) daily. Recheck in 2 weeks, on 12/20. Pt has been stable on this dose in the past so no long term dose change will be made.  Contacted pt and he reports he has been on prednisone and finished 2 days ago. Pt reports this affects warfarin and INR for him.

## 2022-05-01 ENCOUNTER — Ambulatory Visit (INDEPENDENT_AMBULATORY_CARE_PROVIDER_SITE_OTHER): Payer: 59 | Admitting: Family Medicine

## 2022-05-01 ENCOUNTER — Encounter: Payer: Self-pay | Admitting: Family Medicine

## 2022-05-01 VITALS — BP 100/60 | HR 90 | Temp 98.0°F | Ht 70.0 in | Wt 172.8 lb

## 2022-05-01 DIAGNOSIS — G894 Chronic pain syndrome: Secondary | ICD-10-CM

## 2022-05-01 DIAGNOSIS — R760 Raised antibody titer: Secondary | ICD-10-CM

## 2022-05-01 DIAGNOSIS — Z23 Encounter for immunization: Secondary | ICD-10-CM

## 2022-05-01 DIAGNOSIS — I779 Disorder of arteries and arterioles, unspecified: Secondary | ICD-10-CM

## 2022-05-01 DIAGNOSIS — I493 Ventricular premature depolarization: Secondary | ICD-10-CM

## 2022-05-01 DIAGNOSIS — F339 Major depressive disorder, recurrent, unspecified: Secondary | ICD-10-CM

## 2022-05-01 DIAGNOSIS — F17218 Nicotine dependence, cigarettes, with other nicotine-induced disorders: Secondary | ICD-10-CM

## 2022-05-01 DIAGNOSIS — J439 Emphysema, unspecified: Secondary | ICD-10-CM | POA: Diagnosis not present

## 2022-05-01 DIAGNOSIS — Z7901 Long term (current) use of anticoagulants: Secondary | ICD-10-CM

## 2022-05-01 NOTE — Progress Notes (Signed)
Subjective  CC:  Chief Complaint  Patient presents with   Follow-up    HPI: [name] is a 56 y.o. male who presents to the office today to address the problems listed above in the chief complaint. 56 yo with multiple medical problems for 6 mo follow up: reviewed ov notes from specialist.  Overall doing well.  Copd: breathing is stable. Chronic smoker. Eligible for prevnar 20 and lung cancer screening. Not interested in smoking cessation On chronic anticoagulant: antiphospholipid syndrome with h/o PAOD. Stable. No bleeding complications Chronic back pain: had recent flare improved with prednisone. Scheduled for epidural steroid injection with sm. Feeling ok Depression is well controlled on prozac.    Assessment  1. Pulmonary emphysema, unspecified emphysema type ([place])   2. Need for shingles vaccine   3. PAOD (peripheral arterrial occlusion disease) ([place])   4. Chronic pain disorder   5. Major depression, recurrent, chronic ([place])   6. PVC's (premature ventricular contractions)   7. Cigarette nicotine dependence with other nicotine-induced disorder   8. Antiphospholipid antibody positive, requires lifetime Coumadin   9. Long term (current) use of anticoagulants [Z79.01]      Plan  imms:  updated shingrix and prevnar 20 today Refer for lung cancer screening Chronic pain is improved. Follow along Continue prozac 20 daily. Continue coumadin. continuen bb for pvcs Copd: inhalers are helpful. No recent flares.   Follow up: 6 mo for copd  Visit date not found  Orders Placed This Encounter  Procedures   Zoster Recombinant (Shingrix )   Ambulatory Referral for Lung Cancer Scre   No orders of the defined types were placed in this encounter.     I reviewed the patients updated PMH, FH, and SocHx.    Patient Active Problem List   Diagnosis Date Noted   Polycythemia 02/02/2019    Priority: High   PVC's (premature ventricular contractions) 02/02/2019    Priority: High    Pulmonary emphysema ([place]) 08/04/2018    Priority: High   Chronic pain disorder 08/04/2018    Priority: High   Chronic right-sided low back pain with right-sided sciatica 11/19/2016    Priority: High   Long term (current) use of anticoagulants [Z79.01] 09/15/2016    Priority: High   Mixed hyperlipidemia 06/27/2016    Priority: High   Antiphospholipid antibody positive, requires lifetime Coumadin 06/27/2016    Priority: High   PAOD (peripheral arterrial occlusion disease) ([place]) 06/02/2016    Priority: High   Nicotine dependence 06/24/2014    Priority: High   Major depression, recurrent, chronic ([place]) 02/02/2019    Priority: Medium    Cervical pseudoarthrosis ([place] 07/05/2018    Priority: Medium    History of alcohol abuse, sober since 04/2016 07/11/2016    Priority: Medium    Gastroesophageal reflux disease, with small hiatal hernia, Rx Nexium     Priority: Medium    Degenerative arthritis of right knee 09/06/2017    Priority: Low   History of neutrophilia 06/18/2017    Priority: Low   Erectile dysfunction 11/19/2016    Priority: Low   Hx of iron deficiency anemia due to chronic blood loss, hemorrhoids while on coumadin, s/p hemorrhoidectomy 11/2016     Priority: Low   Anxiety disorder 02/02/2019   Arthritis of left acromioclavicular joint 09/29/2016   Current Meds  Medication Sig   carvedilol (COREG) 6.25 MG tablet TAKE 1 TABLET BY MOUTH TWICE DAILY   cilostazol (PLETAL) 50 MG tablet TAKE 1 TABLET BY MOUTH TWICE DAILY  cyclobenzaprine (FLEXERIL) 10 MG tablet TAKE 1 TABLET BY MOUTH AT BEDTIME AS NEEDED FOR MUSCLE SPASMS   enoxaparin (LOVENOX) 120 MG/0.8ML injection INJECT 0.8 mL (120 MG) INTO THE SKIN DAILY AS DIRECTED BY ANTICOAGULATION CLINIC   esomeprazole (NEXIUM) 20 MG capsule Take 20 mg by mouth daily at 12 noon.   FLUoxetine (PROZAC) 20 MG capsule Take 1 capsule (20 mg total) by mouth daily.   Fluticasone-Umeclidin-Vilant 100-62.5-25 MCG/ACT AEPB Inhale 1 puff into  the lungs daily.   gabapentin (NEURONTIN) 600 MG tablet Take 1 tablet (600 mg total) by mouth 3 (three) times daily.   rosuvastatin (CRESTOR) 10 MG tablet TAKE 1 TABLET (10 MG TOTAL) BY MOUTH DAILY.   warfarin (COUMADIN) 6 MG tablet TAKE 1 TABLET BY MOUTH DAILY EXCEPT TAKE 1/2 TABLET ON MONDAYS AND THURSDAYS OR AS DIRECTED BY ANTICOAGULATION CLINIC    Allergies: Patient is allergic to cymbalta [duloxetine hcl]. Family History: Patient family history includes Lung cancer in his father. Social History:  Patient  reports that he has been smoking cigarettes and e-cigarettes. He has a 33.00 pack-year smoking history. He has never used smokeless tobacco. He reports that he does not drink alcohol and does not use drugs.  Review of Systems: Constitutional: Negative for fever malaise or anorexia Cardiovascular: negative for chest pain Respiratory: negative for SOB or persistent cough Gastrointestinal: negative for abdominal pain  Objective  Vitals: BP 100/60   Pulse 90   Temp 98 F (36.7 C)   Ht '5\' 10"'$  (1.778 m)   Wt 172 lb 12.8 oz (78.4 kg)   SpO2 96%   BMI 24.79 kg/m  General: no acute distress , A&Ox3 HEENT: PEERL, conjunctiva normal, neck is supple Cardiovascular:  RRR without murmur or gallop.  Respiratory:  Good breath sounds bilaterally, CTAB with normal respiratory effort Skin:  Warm, no rashes    Commons side effects, risks, benefits, and alternatives for medications and treatment plan prescribed today were discussed, and the patient expressed understanding of the given instructions. Patient is instructed to call or message via MyChart if he/she has any questions or concerns regarding our treatment plan. No barriers to understanding were identified. We discussed Red Flag symptoms and signs in detail. Patient expressed understanding regarding what to do in case of urgent or emergency type symptoms.  Medication list was reconciled, printed and provided to the patient in AVS.  Patient instructions and summary information was reviewed with the patient as documented in the AVS. This note was prepared with assistance of Dragon voice recognition software. Occasional wrong-word or sound-a-like substitutions may have occurred due to the inherent limitations of voice recognition software  This visit occurred during the SARS-CoV-2 public health emergency.  Safety protocols were in place, including screening questions prior to the visit, additional usage of staff PPE, and extensive cleaning of exam room while observing appropriate contact time as indicated for disinfecting solutions.

## 2022-05-01 NOTE — Patient Instructions (Signed)
Please return in 6 months for your annual complete physical; please come fasting.   Today you were given a pneumonia vaccination called prevnar 20 and your 2nd of 2 shingrix vaccinations.   I have placed a referral for a lung cancer screening CT scan. The lung specialist will call you to get you set up with an appointment.   If you have any questions or concerns, please don't hesitate to send me a message via MyChart or call the office at [phone]. Thank you for visiting with [place] today! It's our pleasure caring for you.

## 2022-05-08 ENCOUNTER — Encounter: Payer: Self-pay | Admitting: Family Medicine

## 2022-05-10 ENCOUNTER — Ambulatory Visit (INDEPENDENT_AMBULATORY_CARE_PROVIDER_SITE_OTHER): Payer: 59

## 2022-05-10 ENCOUNTER — Other Ambulatory Visit (HOSPITAL_COMMUNITY): Payer: Self-pay

## 2022-05-10 DIAGNOSIS — Z7901 Long term (current) use of anticoagulants: Secondary | ICD-10-CM

## 2022-05-10 LAB — POCT INR: INR: 2.9 (ref 2.0–3.0)

## 2022-05-10 MED ORDER — ENOXAPARIN SODIUM 120 MG/0.8ML IJ SOSY
120.0000 mg | PREFILLED_SYRINGE | INTRAMUSCULAR | 0 refills | Status: DC
  Filled 2022-05-10: qty 6.4, 8d supply, fill #0

## 2022-05-10 NOTE — Telephone Encounter (Signed)
Pt LVM for return call. He wanted coumadin clinic to know his epidural is scheduled for 12/29. He reports they request his INR to be checked that morning or the evening before the procedure. He reports this has to be put in the chart for [facility] to see the result and proceed with the procedure. He will test either the night before or the morning of the procedure and put the result on [employer], which will need to be put into the chart.   Advised pt when confirmation is obtained from PCP for lovenox bridge schedule, this nurse will f/u with him to let him know the lovenox will be sent in. Pt verbalized understanding.

## 2022-05-10 NOTE — Telephone Encounter (Signed)
Lovenox bridge for epidural procedure tentatively scheduled for 05/19/22.  Current coumadin dosing is 6 mg daily. Diagnoses: antiphospholipid, hx DVT, hx AF CrCl: 101.79 mL/min Wt: 79 kg Lovenox, 120 mg QD, 8 syringes  12/24: Take last dose of coumadin 12/25: NO coumadin, NO Lovenox 12/26: NO coumadin, Lovenox in the AM 12/27: NO coumadin, Lovenox in the AM 12/28: NO coumadin, Lovenox in the AM (before 7 AM)  12/29: Procedure; NO COUMADIN, NO LOVENOX  12/30: Take 9 mg coumadin, Lovenox AM 12/31: Take 9 mg coumadin, Lovenox AM 1/1: Take 9 mg coumadin, Lovenox AM 1/2: Take 9 mg coumadin, Lovenox AM 1/3: Take 6 mg coumadin, Lovenox AM 1/4: Recheck INR

## 2022-05-10 NOTE — Progress Notes (Signed)
Received INR result of 2.9 from [employer].   Pt is trying to schedule a back epidural injection by the end of the year. He may need another lovenox bridge for this. Procedure has not been scheduled yet.   Continue to take 1 tablet (6 mg) daily. Recheck in 2 weeks, on 05/24/22.

## 2022-05-10 NOTE — Patient Instructions (Addendum)
Pre visit review using our clinic review tool, if applicable. No additional management support is needed unless otherwise documented below in the visit note.  Continue to take 1 tablet (6 mg) daily. Recheck in 2 weeks, on 05/24/22.

## 2022-05-10 NOTE — Telephone Encounter (Signed)
Sent pt a Comptroller with dosing instructions and LVM advising of the msg. Sent in script for lovenox.

## 2022-05-11 ENCOUNTER — Other Ambulatory Visit (HOSPITAL_COMMUNITY): Payer: Self-pay

## 2022-05-16 ENCOUNTER — Other Ambulatory Visit (HOSPITAL_COMMUNITY): Payer: Self-pay

## 2022-05-18 LAB — POCT INR: INR: 1.4 — AB (ref 2.0–3.0)

## 2022-05-18 NOTE — Progress Notes (Signed)
[facility] [address] [place] Phone: [phone] Subjective:    I'm seeing this patient by the request  of:  Leamon Arnt, MD  CC: Back pain and other problem follow-up  NGE:[id number]  09/09/2021 Patient did have another infusion and feeling significantly better.  Recheck ferritin levels again in the near future.  Should probably check every 3 to 6 months     Chronic problem but stable.  Patient has responded extremely well to the epidural.  We discussed potentially preventative injections today and every 3 to 4 months.  We will put in a order at this time to have another one in case patient would like to have it earlier.  In addition to this we will continue to monitor patient's iron deficiency that could also contribute.  We will ensure that it is staying stable.  Follow-up with me again as needed      Update 05/19/2022 Brewer Hitchman is a 56 y.o. male coming in with complaint of lumbar spine pain. Patient states  Last iron check for this individual was in October and showed that patient's iron was doing well. Patient is actually scheduled for another epidural at 1130 today.  Last 1 was in June 2023. That he is okay     Past Medical History:  Diagnosis Date   Anticoagulated on Coumadin    Antiphospholipid antibody positive, followed by Dr. Irene Limbo, Hematology 06/27/2016   Anxiety with depression 06/24/2014   Chronic back pain    ED (erectile dysfunction)    GERD (gastroesophageal reflux disease)    Hemorrhoids, internal, with bleeding    Hiatal hernia    History of adenomatous polyp of colon, 07/08/16, tubular adenomas    History of atrial fibrillation, episode 08/2016    History of cardiomyopathy ([place]) 08/04/2018   ECHO 12/02/18  1. The left ventricle has normal systolic function with an ejection fraction of 60-65%. The cavity size was normal. Left ventricular diastolic parameters were normal.  2. The right ventricle has normal  systolic function. The cavity was normal. There is no increase in right ventricular wall thickness.  3. The mitral valve is grossly normal.  4. The aortic valve is tricuspid. Mild thic   History of DVT of lower extremity    Hyperlipidemia    Iron deficiency anemia due to chronic blood loss, thought to be from hemorrhoids while on coumadin, s/p iron infusion and blood transfusion x 2, 2018 2018   Low testosterone in male 05/04/2017   2018, 2019: Decision would be risk vs benefit. Could increase thrombotic risk especially if he has a polycythemic response.  If he decided to pursue this with an understanding of the risk - would use lowest dose of testosterone to maintain level low normal 300-400.  2020: Secondary polycythemia. Not a candidate.   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[facility]), sober since 04/2016    S/P insertion of iliac artery stent 2018   02-23-2017  right CIA PTA and stent/05-11-2016 in-stent restenosis s/p thrombectomy/2018 thrombectomy and  patch angioplasty right femoral artery    Past Surgical History:  Procedure Laterality Date   ANGIOPLASTY ILLIAC ARTERY Right 07/17/2016  Procedure: ANGIOPLASTY RIGHT COMMON ILIAC ARTERY;  Surgeon: Conrad Menlo, MD;  Location: [place];  Service: Vascular;  Laterality: Right;   ANTERIOR CERVICAL DECOMP/DISCECTOMY FUSION  07/21/2010   C5 -- C7   CARDIOVASCULAR STRESS TEST  06-14-2016   dr hilty   normal perfusion study w/ no reversible ischemia/  stress ef 44% but visually looks better , echo ordered (LVEF 30-44%)  , normal LV wall motion    COLONOSCOPY N/A 07/08/2016   Procedure: COLONOSCOPY;  Surgeon: Clarene Essex, MD;  Location: [facility];  Service: Endoscopy;  Laterality: N/A;   ESOPHAGOGASTRODUODENOSCOPY N/A 07/08/2016   Procedure: ESOPHAGOGASTRODUODENOSCOPY (EGD);  Surgeon: Clarene Essex, MD;  Location: [facility];  Service: Endoscopy;  Laterality: N/A;   GROIN DEBRIDEMENT Right 07/25/2016   Procedure: EVACUATION HEMATOMA RIGHT GROIN;  Surgeon: Waynetta Sandy, MD;  Location: [place];  Service: Vascular;  Laterality: Right;   HEMORRHOID SURGERY N/A 12/07/2016   Procedure: 3 COLUMN HEMORRHOIDECTOMY;  Surgeon: Leighton Ruff, MD;  Location[facility];  Service: General;  Laterality: N/A;   INTRAOPERATIVE ARTERIOGRAM Right 07/17/2016   Procedure: INTRA OPERATIVE ANGIOGRAM OF RIGHT COMMON ILIAC ARTERY;  Surgeon: Conrad Gamewell, MD;  Location: [place];  Service: Vascular;  Laterality: Right;   KNEE SURGERY Right 1983   cartilage   PATCH ANGIOPLASTY Right 07/17/2016   Procedure: PATCH ANGIOPLASTY RIGHT FEMORAL ARTERY USING Rueben Bash BIOLOGIC PATCH;  Surgeon: Conrad Lake Holiday, MD;  Location: [place];  Service: Vascular;  Laterality: Right;   PERIPHERAL VASCULAR CATHETERIZATION N/A 02/24/2016   Procedure: Abdominal Aortogram w/Lower Extremity;  Surgeon: Conrad [place], MD;  Location: [facility];  Service: Cardiovascular;  Laterality: N/A;   PERIPHERAL VASCULAR CATHETERIZATION Right 02/24/2016   Procedure: Peripheral Vascular Intervention;  Surgeon: Conrad Huttig, MD;  Location: [place] [facility];  Service: Cardiovascular;  Laterality: Right;  Common iliac   PERIPHERAL VASCULAR CATHETERIZATION N/A 05/11/2016   Procedure: Abdominal Aortogram;  Surgeon: Conrad Cottonport, MD;  Location: [place] [facility];  Service: Cardiovascular;  Laterality: N/A;   PERIPHERAL VASCULAR CATHETERIZATION N/A 05/11/2016   Procedure: Lower Extremity Angiography;  Surgeon: Conrad Verlot, MD;  Location: [place] [facility];  Service: Cardiovascular;  Laterality: N/A;   POSTERIOR CERVICAL FUSION/FORAMINOTOMY N/A 07/05/2018   Procedure: Posterior  cervical fusion with lateral mass fixation - Cervical five-Cervical six, Cervical six-Cervical seven;  Surgeon: Earnie Larsson, MD;  Location: [place];  Service: Neurosurgery;  Laterality: N/A;   THROMBECTOMY FEMORAL ARTERY Right 07/17/2016   Procedure: THROMBECTOMY RIGHT FEMORAL ARTERY;  Surgeon: Conrad Greencastle, MD;  Location: [place];  Service: Vascular;  Laterality: Right;   TRANSTHORACIC ECHOCARDIOGRAM  06-20-2016   dr hilty   EF 55-60%,  grade 1 diastolic dysfunction, mild TR   Social History   Socioeconomic History   Marital status: Married    Spouse name: Almyra Free   Number of children: Not on file   Years of education: Not on file   Highest education level: Not on file  Occupational History   Occupation: Disabled  Tobacco Use   Smoking status: Every Day    Packs/day: 1.00    Years: 33.00    Total pack years: 33.00    Types: Cigarettes, E-cigarettes   Smokeless tobacco: Never  Vaping Use   Vaping Use: Never used  Substance and Sexual Activity   Alcohol use: No    Comment: Sober since 2017   Drug use: No   Sexual activity: Yes  Partners: Female  Other Topics Concern   Not on file  Social History Narrative   Not on file   Social Determinants of Health   Financial Resource Strain: Not on file  Food Insecurity: Not on file  Transportation Needs: Not on file  Physical Activity: Not on file  Stress: Not on file  Social Connections: Not on file   Allergies  Allergen Reactions   Cymbalta [Duloxetine Hcl] Other (See Comments)    Headaches   Family History  Problem Relation Age of Onset   Lung cancer Father      Current Outpatient Medications (Cardiovascular):    carvedilol (COREG) 6.25 MG tablet, TAKE 1 TABLET BY MOUTH TWICE DAILY   rosuvastatin (CRESTOR) 10 MG tablet, TAKE 1 TABLET (10 MG TOTAL) BY MOUTH DAILY.  Current Outpatient Medications (Respiratory):    Fluticasone-Umeclidin-Vilant 100-62.5-25 MCG/ACT AEPB, Inhale 1 puff into the lungs daily.   Current  Outpatient Medications (Hematological):    cilostazol (PLETAL) 50 MG tablet, TAKE 1 TABLET BY MOUTH TWICE DAILY   enoxaparin (LOVENOX) 120 MG/0.8ML injection, INJECT 0.8 mL (120 MG) INTO THE SKIN DAILY AS DIRECTED BY ANTICOAGULATION CLINIC   enoxaparin (LOVENOX) 120 MG/0.8ML injection, Inject 0.8 mLs (120 mg total) into the skin daily. PLEASE FOLLOW INSTRUCTION GIVEN BY ANTICOAGULATION CLINIC   warfarin (COUMADIN) 6 MG tablet, TAKE 1 TABLET BY MOUTH DAILY EXCEPT TAKE 1/2 TABLET ON MONDAYS AND THURSDAYS OR AS DIRECTED BY ANTICOAGULATION CLINIC  Current Outpatient Medications (Other):    cyclobenzaprine (FLEXERIL) 10 MG tablet, TAKE 1 TABLET BY MOUTH AT BEDTIME AS NEEDED FOR MUSCLE SPASMS   esomeprazole (NEXIUM) 20 MG capsule, Take 20 mg by mouth daily at 12 noon.   FLUoxetine (PROZAC) 20 MG capsule, Take 1 capsule (20 mg total) by mouth daily.   gabapentin (NEURONTIN) 600 MG tablet, Take 1 tablet (600 mg total) by mouth 3 (three) times daily.   Reviewed prior external information including notes and imaging from  primary care provider As well as notes that were available from care everywhere and other healthcare systems.  Have seen in the providers recently and was found to have marked pulmonary emphysema  Past medical history, social, surgical and family history all reviewed in electronic medical record.  No pertanent information unless stated regarding to the chief complaint.   Review of Systems:  No headache, visual changes, nausea, vomiting, diarrhea, constipation, dizziness, abdominal pain, skin rash, fevers, chills, night sweats, weight loss, swollen lymph nodes, body aches, joint swelling, chest pain, shortness of breath, mood changes. POSITIVE muscle aches mild overall but no significant fatigue  Objective  Blood pressure 130/84, pulse 62, height '5\' 10"'$  (1.778 m), weight 172 lb (78 kg), SpO2 96 %.   General: No apparent distress alert and oriented x3 mood and affect normal, dressed  appropriately.  HEENT: Pupils equal, extraocular movements intact  Respiratory: Patient's speak in full sentences and does not appear short of breath  Cardiovascular: No lower extremity edema, non tender, no erythema  Low back exam shows does have some loss of lordosis.,  Nothing severe though at this moment.  Patient does have some tenderness of the back of course as well. Blood flow in the lower extremities seems to be doing well.   Impression and Recommendations:     The above documentation has been reviewed and is accurate and complete William Pulley, DO

## 2022-05-19 ENCOUNTER — Ambulatory Visit: Payer: Self-pay

## 2022-05-19 ENCOUNTER — Ambulatory Visit
Admission: RE | Admit: 2022-05-19 | Discharge: 2022-05-19 | Disposition: A | Discharge status: Home / Self Care | Payer: 59 | Source: Ambulatory Visit | Attending: Family Medicine | Admitting: Family Medicine

## 2022-05-19 ENCOUNTER — Ambulatory Visit (INDEPENDENT_AMBULATORY_CARE_PROVIDER_SITE_OTHER): Payer: 59 | Admitting: Family Medicine

## 2022-05-19 ENCOUNTER — Encounter: Payer: Self-pay | Admitting: Family Medicine

## 2022-05-19 VITALS — BP 130/84 | HR 62 | Ht 70.0 in | Wt 172.0 lb

## 2022-05-19 DIAGNOSIS — I779 Disorder of arteries and arterioles, unspecified: Secondary | ICD-10-CM | POA: Diagnosis not present

## 2022-05-19 DIAGNOSIS — M5441 Lumbago with sciatica, right side: Secondary | ICD-10-CM | POA: Diagnosis not present

## 2022-05-19 DIAGNOSIS — G8929 Other chronic pain: Secondary | ICD-10-CM

## 2022-05-19 DIAGNOSIS — M5416 Radiculopathy, lumbar region: Secondary | ICD-10-CM

## 2022-05-19 MED ORDER — IOPAMIDOL (ISOVUE-M 200) INJECTION 41%
1.0000 mL | Freq: Once | INTRAMUSCULAR | Status: AC
  Administered 2022-05-19: 1 mL via EPIDURAL

## 2022-05-19 MED ORDER — METHYLPREDNISOLONE ACETATE 40 MG/ML INJ SUSP (RADIOLOG
80.0000 mg | Freq: Once | INTRAMUSCULAR | Status: AC
  Administered 2022-05-19: 80 mg via EPIDURAL

## 2022-05-19 NOTE — Progress Notes (Signed)
Received INR result of 1.4 from [employer].   INR is intentionally subtherapeutic. Pt is on a Lovenox bridge for epidural injection today. Encounter created for documentation per request of pt and [facility].  Pt is to follow previous bridge instructions post-procedure.

## 2022-05-19 NOTE — Assessment & Plan Note (Signed)
Getting another epidural today.  Hopefully will be beneficial.  Likely not having any weakness or pain in the legs.  Has had peripheral vascular disease as well that is being monitored by vascular.  We discussed again about patient's iron deficiency and should check iron labs every 6 months at least with him being stable at this moment.  Follow-up with me again as needed

## 2022-05-19 NOTE — Discharge Instructions (Signed)
Post Procedure Spinal Discharge Instruction Sheet  You may resume a regular diet and any medications that you routinely take (including pain medications) unless otherwise noted by MD.  No driving day of procedure.  Light activity throughout the rest of the day.  Do not do any strenuous work, exercise, bending or lifting.  The day following the procedure, you can resume normal physical activity but you should refrain from exercising or physical therapy for at least three days thereafter.  You may apply ice to the injection site, 20 minutes on, 20 minutes off, as needed. Do not apply ice directly to skin.    Common Side Effects:  Headaches- take your usual medications as directed by your physician.  Increase your fluid intake.  Caffeinated beverages may be helpful.  Lie flat in bed until your headache resolves.  Restlessness or inability to sleep- you may have trouble sleeping for the next few days.  Ask your referring physician if you need any medication for sleep.  Facial flushing or redness- should subside within a few days.  Increased pain- a temporary increase in pain a day or two following your procedure is not unusual.  Take your pain medication as prescribed by your referring physician.  Leg cramps  Please contact our office at [phone] for the following symptoms: Fever greater than 100 degrees. Headaches unresolved with medication after 2-3 days. Increased swelling, pain, or redness at injection site.   Thank you for visiting [facility] today.   YOU MAY RESUME YOUR PLETAL 4 HOURS AFTER PROCEDURE AND YOUR COUMADIN 12 HOURS AFTER PROCEDURE UNLESS DIRECTED OTHERWISE BY YOUR PROVIDER

## 2022-05-25 ENCOUNTER — Ambulatory Visit (INDEPENDENT_AMBULATORY_CARE_PROVIDER_SITE_OTHER): Payer: Commercial Managed Care - PPO

## 2022-05-25 DIAGNOSIS — Z7901 Long term (current) use of anticoagulants: Secondary | ICD-10-CM

## 2022-05-25 LAB — POCT INR: INR: 2.6 (ref 2.0–3.0)

## 2022-05-25 NOTE — Patient Instructions (Signed)
Pre visit review using our clinic review tool, if applicable. No additional management support is needed unless otherwise documented below in the visit note.  Continue 6 mg daily. Recheck in 2 weeks.  

## 2022-05-25 NOTE — Progress Notes (Signed)
Received INR result of 2.6 from [employer].   Pt had back injection on 12/29 and was on a lovenox bridge.  Continue 6 mg daily. Recheck in 2 weeks.

## 2022-06-04 ENCOUNTER — Other Ambulatory Visit: Payer: Self-pay | Admitting: Family Medicine

## 2022-06-04 DIAGNOSIS — D6859 Other primary thrombophilia: Secondary | ICD-10-CM

## 2022-06-05 ENCOUNTER — Other Ambulatory Visit: Payer: Self-pay

## 2022-06-05 ENCOUNTER — Other Ambulatory Visit (HOSPITAL_COMMUNITY): Payer: Self-pay

## 2022-06-05 MED ORDER — WARFARIN SODIUM 6 MG PO TABS
ORAL_TABLET | ORAL | 1 refills | Status: DC
  Filled 2022-06-05: qty 72, 84d supply, fill #0
  Filled 2022-08-20: qty 72, 84d supply, fill #1
  Filled 2022-11-05: qty 46, 46d supply, fill #2

## 2022-06-06 ENCOUNTER — Other Ambulatory Visit (HOSPITAL_COMMUNITY): Payer: Self-pay

## 2022-06-07 ENCOUNTER — Ambulatory Visit (INDEPENDENT_AMBULATORY_CARE_PROVIDER_SITE_OTHER): Payer: Commercial Managed Care - PPO

## 2022-06-07 DIAGNOSIS — Z7901 Long term (current) use of anticoagulants: Secondary | ICD-10-CM

## 2022-06-07 LAB — POCT INR: INR: 2.8 (ref 2.0–3.0)

## 2022-06-07 NOTE — Progress Notes (Signed)
Received INR result of 2.8 from [employer].   Continue 6 mg daily. Recheck in 2 weeks.

## 2022-06-07 NOTE — Patient Instructions (Signed)
Pre visit review using our clinic review tool, if applicable. No additional management support is needed unless otherwise documented below in the visit note.  Continue 6 mg daily. Recheck in 2 weeks.  

## 2022-06-20 ENCOUNTER — Encounter: Payer: Self-pay | Admitting: Family Medicine

## 2022-06-20 ENCOUNTER — Other Ambulatory Visit (HOSPITAL_COMMUNITY): Payer: Self-pay

## 2022-06-21 ENCOUNTER — Other Ambulatory Visit (HOSPITAL_COMMUNITY): Payer: Self-pay

## 2022-06-21 ENCOUNTER — Ambulatory Visit (INDEPENDENT_AMBULATORY_CARE_PROVIDER_SITE_OTHER): Payer: Commercial Managed Care - PPO

## 2022-06-21 DIAGNOSIS — Z7901 Long term (current) use of anticoagulants: Secondary | ICD-10-CM

## 2022-06-21 DIAGNOSIS — R760 Raised antibody titer: Secondary | ICD-10-CM | POA: Diagnosis not present

## 2022-06-21 LAB — POCT INR: INR: 2.6 (ref 2.0–3.0)

## 2022-06-21 NOTE — Progress Notes (Signed)
Pt tests at home. Received INR result of 2.6 from [employer] today.  Continue 6 mg daily. Recheck in 2 weeks.

## 2022-06-21 NOTE — Patient Instructions (Addendum)
Pre visit review using our clinic review tool, if applicable. No additional management support is needed unless otherwise documented below in the visit note.  Continue 6 mg daily. Recheck in 2 weeks.  

## 2022-06-30 ENCOUNTER — Telehealth: Payer: Self-pay | Admitting: Family Medicine

## 2022-06-30 NOTE — Telephone Encounter (Signed)
[name] with [employer] states an INR RX for  Patient that has 2 different diagnosis  R76.0  and Z 79.01. States it is not covered under [employer].  States she faxed over a new RX with the correct diagnosis code that will be covered by insurance plan.

## 2022-07-02 ENCOUNTER — Other Ambulatory Visit (HOSPITAL_COMMUNITY): Payer: Self-pay

## 2022-07-03 ENCOUNTER — Other Ambulatory Visit (HOSPITAL_COMMUNITY): Payer: Self-pay

## 2022-07-04 NOTE — Telephone Encounter (Signed)
Forms placed in [name]'s box

## 2022-07-06 ENCOUNTER — Other Ambulatory Visit (HOSPITAL_COMMUNITY): Payer: Self-pay

## 2022-07-10 ENCOUNTER — Other Ambulatory Visit (HOSPITAL_COMMUNITY): Payer: Self-pay

## 2022-07-10 ENCOUNTER — Other Ambulatory Visit: Payer: Self-pay

## 2022-07-10 ENCOUNTER — Encounter (HOSPITAL_COMMUNITY): Payer: Self-pay

## 2022-07-11 NOTE — Telephone Encounter (Signed)
They are faxing [place] the form with the diagnosis codes that needs to be on RX that is sent. Please review and fix and resend.

## 2022-07-14 ENCOUNTER — Telehealth: Payer: Self-pay | Admitting: Family Medicine

## 2022-07-14 ENCOUNTER — Telehealth: Payer: Self-pay

## 2022-07-14 NOTE — Telephone Encounter (Signed)
Form has been placed on [name]'s desk for correction

## 2022-07-14 NOTE — Telephone Encounter (Signed)
I reached to [facility] regarding pt and what exactly that they are looking for. I spoke with a lady name April and she faxed me over a paper with the allowable CMS Codes on it for you to look at. I have placed the form on your desk for you to look at on 07/17/2022.

## 2022-07-14 NOTE — Telephone Encounter (Signed)
Caller states: -Patient is trying to get home testing meter for [facility] - They need a new rx order since the diagnosis codes on the last order are not covered by insurance  - Codes not covered are: Z79.01, R76.0, and Z86.718; States that Z79.01 can be used but would need to have different dx codes attached to it.  New rx order can be faxed @ [phone] or emailed to [facility] @ [email or web address]  Codes that are covered can be found on [place] website

## 2022-07-14 NOTE — Telephone Encounter (Signed)
Talked to [employer] representative and she has left a msg with the clinic to f/u on order. Mardene Celeste, CMA, has tried to email the order to this nurse but it has not come through. Unsure if there is a Hotel manager issue with scanner or email.

## 2022-07-14 NOTE — Telephone Encounter (Signed)
Received call from pt reporting he is still have difficulty getting his strips for his INR machine. He said the last time he talked to the company that the office has faxed the order with the correct diagnosis but one page was cut off in the fax. Advised this nurse will f/u with him later today. Pt appreciative and verbalized understanding.  Pt was due for INR check last week but was unable to test due to not having any strips.

## 2022-07-17 ENCOUNTER — Other Ambulatory Visit (HOSPITAL_COMMUNITY): Payer: Self-pay

## 2022-07-17 ENCOUNTER — Other Ambulatory Visit: Payer: Self-pay

## 2022-07-25 ENCOUNTER — Encounter: Payer: Self-pay | Admitting: Cardiology

## 2022-07-26 LAB — POCT INR: INR: 2.8 (ref 2.0–3.0)

## 2022-07-27 ENCOUNTER — Other Ambulatory Visit: Payer: Self-pay | Admitting: *Deleted

## 2022-07-27 ENCOUNTER — Ambulatory Visit (INDEPENDENT_AMBULATORY_CARE_PROVIDER_SITE_OTHER): Payer: Commercial Managed Care - PPO

## 2022-07-27 DIAGNOSIS — Z7901 Long term (current) use of anticoagulants: Secondary | ICD-10-CM

## 2022-07-27 DIAGNOSIS — I493 Ventricular premature depolarization: Secondary | ICD-10-CM

## 2022-07-27 NOTE — Patient Instructions (Signed)
Pre visit review using our clinic review tool, if applicable. No additional management support is needed unless otherwise documented below in the visit note.  Continue 6 mg daily. Recheck in 2 weeks.

## 2022-07-27 NOTE — Progress Notes (Signed)
Pt tests at home. Received INR result of 2.8 from [employer] today. Continue 6 mg daily. Recheck in 2 weeks. LVM with instructions.

## 2022-07-31 ENCOUNTER — Other Ambulatory Visit: Payer: Self-pay

## 2022-07-31 DIAGNOSIS — Z87891 Personal history of nicotine dependence: Secondary | ICD-10-CM

## 2022-07-31 DIAGNOSIS — F17210 Nicotine dependence, cigarettes, uncomplicated: Secondary | ICD-10-CM

## 2022-08-10 ENCOUNTER — Ambulatory Visit (INDEPENDENT_AMBULATORY_CARE_PROVIDER_SITE_OTHER): Payer: Commercial Managed Care - PPO

## 2022-08-10 DIAGNOSIS — Z7901 Long term (current) use of anticoagulants: Secondary | ICD-10-CM | POA: Diagnosis not present

## 2022-08-10 LAB — POCT INR: INR: 2.8 (ref 2.0–3.0)

## 2022-08-10 NOTE — Progress Notes (Signed)
Pt tests at home. Received INR result of 2.8 from [employer] today. Continue 6 mg daily. Recheck in 2 weeks, on 4/4. Sent mychart msg with instructions

## 2022-08-10 NOTE — Patient Instructions (Addendum)
Pre visit review using our clinic review tool, if applicable. No additional management support is needed unless otherwise documented below in the visit note.  Continue 6 mg daily. Recheck in 2 weeks, on 4/4.

## 2022-08-11 ENCOUNTER — Ambulatory Visit (INDEPENDENT_AMBULATORY_CARE_PROVIDER_SITE_OTHER): Payer: Commercial Managed Care - PPO | Admitting: Physician Assistant

## 2022-08-11 ENCOUNTER — Encounter: Payer: Self-pay | Admitting: Physician Assistant

## 2022-08-11 DIAGNOSIS — F17210 Nicotine dependence, cigarettes, uncomplicated: Secondary | ICD-10-CM | POA: Diagnosis not present

## 2022-08-11 NOTE — Patient Instructions (Signed)
Thank you for participating in the [facility]. It was our pleasure to meet you today. We will call you with the results of your scan within the next few days. Your scan will be assigned a Lung RADS category score by the physicians reading the scans.  This Lung RADS score determines follow up scanning.  See below for description of categories, and follow up screening recommendations. We will be in touch to schedule your follow up screening annually or based on recommendations of our providers. We will fax a copy of your scan results to your Primary Care Physician, or the physician who referred you to the program, to ensure they have the results. Please call the office if you have any questions or concerns regarding your scanning experience or results.  Our office number is [phone]. Please speak with Denise Phelps, RN. , or  Denise Buckner RN, They are  our Lung Cancer Screening RN.'s If They are unavailable when you call, Please leave a message on the voice mail. We will return your call at our earliest convenience.This voice mail is monitored several times a day.  Remember, if your scan is normal, we will scan you annually as long as you continue to meet the criteria for the program. (Age 50-80, Current smoker or smoker who has quit within the last 15 years). If you are a smoker, remember, quitting is the single most powerful action that you can take to decrease your risk of lung cancer and other pulmonary, breathing related problems. We know quitting is hard, and we are here to help.  Please let us know if there is anything we can do to help you meet your goal of quitting. If you are a former smoker, congratulations. We are proud of you! Remain smoke free! Remember you can refer friends or family members through the number above.  We will screen them to make sure they meet criteria for the program. Thank you for helping us take better care of you by  participating in Lung Screening.  You can receive free nicotine replacement therapy ( patches, gum or mints) by calling 1-800-QUIT NOW. Please call so we can get you on the path to becoming  a non-smoker. I know it is hard, but you can do this!  Lung RADS Categories:  Lung RADS 1: no nodules or definitely non-concerning nodules.  Recommendation is for a repeat annual scan in 12 months.  Lung RADS 2:  nodules that are non-concerning in appearance and behavior with a very low likelihood of becoming an active cancer. Recommendation is for a repeat annual scan in 12 months.  Lung RADS 3: nodules that are probably non-concerning , includes nodules with a low likelihood of becoming an active cancer.  Recommendation is for a 6-month repeat screening scan. Often noted after an upper respiratory illness. We will be in touch to make sure you have no questions, and to schedule your 6-month scan.  Lung RADS 4 A: nodules with concerning findings, recommendation is most often for a follow up scan in 3 months or additional testing based on our provider's assessment of the scan. We will be in touch to make sure you have no questions and to schedule the recommended 3 month follow up scan.  Lung RADS 4 B:  indicates findings that are concerning. We will be in touch with you to schedule additional diagnostic testing based on our provider's  assessment of the scan.  Other options for assistance in smoking cessation (   As covered by your insurance benefits)  Hypnosis for smoking cessation  [employer] [phone]  Acupuncture for smoking cessation  [facility] [phone]   

## 2022-08-11 NOTE — Progress Notes (Signed)
Virtual Visit via Telephone Note  I connected with [name] on 08/11/22 at 10:30 AM EDT by telephone and verified that I am speaking with the correct person using two identifiers.  Location: Patient: home Provider: working virtually from home   I discussed the limitations, risks, security and privacy concerns of performing an evaluation and management service by telephone and the availability of in person appointments. I also discussed with the patient that there may be a patient responsible charge related to this service. The patient expressed understanding and agreed to proceed.       Shared Decision Making Visit Lung Cancer Screening Program [phone])   Eligibility: Age 57 y.o. Pack Years Smoking History Calculation 50 (# packs/per year x # years smoked) Recent History of coughing up blood  no Unexplained weight loss? no ( >Than 15 pounds within the last 6 months ) Prior History Lung / other cancer no (Diagnosis within the last 5 years already requiring surveillance chest CT Scans). Smoking Status Current Smoker  Visit Components: Discussion included one or more decision making aids. yes Discussion included risk/benefits of screening. yes Discussion included potential follow up diagnostic testing for abnormal scans. yes Discussion included meaning and risk of over diagnosis. yes Discussion included meaning and risk of False Positives. yes Discussion included meaning of total radiation exposure. yes  Counseling Included: Importance of adherence to annual lung cancer LDCT screening. yes Impact of comorbidities on ability to participate in the program. yes Ability and willingness to under diagnostic treatment. yes  Smoking Cessation Counseling: Current Smokers:  Discussed importance of smoking cessation. yes Information about tobacco cessation classes and interventions provided to patient. yes Patient provided with "ticket" for LDCT Scan. N/a Symptomatic  Patient. No Diagnosis Code: Tobacco Use Z72.0 Asymptomatic Patient yes  Counseling (Intermediate counseling: > three minutes counseling) [id number] Information about tobacco cessation classes and interventions provided to patient. Yes Patient provided with "ticket" for LDCT Scan. yes Written Order for Lung Cancer Screening with LDCT placed in Epic. Yes (CT Chest Lung Cancer Screening Low Dose W/O CM) [id number] Z12.2-Screening of respiratory organs Z87.891-Personal history of nicotine dependence  I have spent 25 minutes of face to face/ virtual visit   time with the patient discussing the risks and benefits of lung cancer screening. We viewed / discussed a power point together that explained in detail the above noted topics. We paused at intervals to allow for questions to be asked and answered to ensure understanding.We discussed that the single most powerful action that he can take to decrease his risk of developing lung cancer is to quit smoking. We discussed whether or not he is ready to commit to setting a quit date. We discussed options for tools to aid in quitting smoking including nicotine replacement therapy, non-nicotine medications, support groups, Quit Smart classes, and behavior modification. We discussed that often times setting smaller, more achievable goals, such as eliminating 1 cigarette a day for a week and then 2 cigarettes a day for a week can be helpful in slowly decreasing the number of cigarettes smoked. This allows for a sense of accomplishment as well as providing a clinical benefit. I provided  him  with smoking cessation  information  with contact information for community resources, classes, free nicotine replacement therapy, and access to mobile apps, text messaging, and on-line smoking cessation help. I have also provided  him  the office contact information in the event he needs to contact me, or the screening staff. We discussed the time and  location of the scan, and that either  Doroteo Glassman RN, Joella Prince, RN  or I will call / send a letter with the results within 24-72 hours of receiving them. The patient verbalized understanding of all of  the above and had no further questions upon leaving the office. They have my contact information in the event they have any further questions.  I spent three minutes counseling on smoking cessation and the health risks of continued tobacco abuse.  I explained to the patient that there has been a high incidence of coronary artery disease noted on these exams. I explained that this is a non-gated exam therefore degree or severity cannot be determined. This patient is on statin therapy. I have asked the patient to follow-up with their PCP regarding any incidental finding of coronary artery disease and management with diet or medication as their PCP  feels is clinically indicated. The patient verbalized understanding of the above and had no further questions upon completion of the visit.      Otilio Carpen Srihith Aquilino, PA-C

## 2022-08-15 ENCOUNTER — Ambulatory Visit (HOSPITAL_BASED_OUTPATIENT_CLINIC_OR_DEPARTMENT_OTHER)
Admission: RE | Admit: 2022-08-15 | Discharge: 2022-08-15 | Disposition: A | Discharge status: Home / Self Care | Payer: Commercial Managed Care - PPO | Source: Ambulatory Visit | Attending: Family Medicine | Admitting: Family Medicine

## 2022-08-15 DIAGNOSIS — Z87891 Personal history of nicotine dependence: Secondary | ICD-10-CM | POA: Diagnosis not present

## 2022-08-15 DIAGNOSIS — F17210 Nicotine dependence, cigarettes, uncomplicated: Secondary | ICD-10-CM | POA: Insufficient documentation

## 2022-08-16 ENCOUNTER — Other Ambulatory Visit: Payer: Self-pay | Admitting: Acute Care

## 2022-08-16 DIAGNOSIS — Z122 Encounter for screening for malignant neoplasm of respiratory organs: Secondary | ICD-10-CM

## 2022-08-16 DIAGNOSIS — F17210 Nicotine dependence, cigarettes, uncomplicated: Secondary | ICD-10-CM

## 2022-08-16 DIAGNOSIS — Z87891 Personal history of nicotine dependence: Secondary | ICD-10-CM

## 2022-08-20 ENCOUNTER — Other Ambulatory Visit (HOSPITAL_COMMUNITY): Payer: Self-pay

## 2022-08-24 ENCOUNTER — Ambulatory Visit (INDEPENDENT_AMBULATORY_CARE_PROVIDER_SITE_OTHER): Payer: Commercial Managed Care - PPO

## 2022-08-24 DIAGNOSIS — Z7901 Long term (current) use of anticoagulants: Secondary | ICD-10-CM

## 2022-08-24 LAB — POCT INR: INR: 2.6 (ref 2.0–3.0)

## 2022-08-24 NOTE — Progress Notes (Signed)
Will forward to PCP 

## 2022-08-24 NOTE — Progress Notes (Signed)
Pt tests at home. Received INR result of 2.6 from [employer] today. Continue 6 mg daily. Recheck in 2 weeks, on 4/18. Sent mychart msg with instructions

## 2022-08-24 NOTE — Patient Instructions (Addendum)
Pre visit review using our clinic review tool, if applicable. No additional management support is needed unless otherwise documented below in the visit note.  Continue 6 mg daily. Recheck in 2 weeks, on 4/18.

## 2022-08-29 ENCOUNTER — Ambulatory Visit (HOSPITAL_BASED_OUTPATIENT_CLINIC_OR_DEPARTMENT_OTHER): Payer: Commercial Managed Care - PPO

## 2022-08-29 ENCOUNTER — Ambulatory Visit (INDEPENDENT_AMBULATORY_CARE_PROVIDER_SITE_OTHER)
Admission: RE | Admit: 2022-08-29 | Discharge: 2022-08-29 | Disposition: A | Discharge status: Home / Self Care | Payer: Commercial Managed Care - PPO | Source: Ambulatory Visit | Attending: Vascular Surgery | Admitting: Vascular Surgery

## 2022-08-29 ENCOUNTER — Ambulatory Visit (HOSPITAL_COMMUNITY)
Admission: RE | Admit: 2022-08-29 | Discharge: 2022-08-29 | Disposition: A | Discharge status: Home / Self Care | Payer: Commercial Managed Care - PPO | Source: Ambulatory Visit | Attending: Vascular Surgery | Admitting: Vascular Surgery

## 2022-08-29 ENCOUNTER — Ambulatory Visit (INDEPENDENT_AMBULATORY_CARE_PROVIDER_SITE_OTHER): Payer: Commercial Managed Care - PPO | Admitting: Physician Assistant

## 2022-08-29 VITALS — BP 122/76 | HR 89 | Temp 98.0°F | Resp 18 | Ht 70.0 in | Wt 171.8 lb

## 2022-08-29 DIAGNOSIS — I779 Disorder of arteries and arterioles, unspecified: Secondary | ICD-10-CM | POA: Diagnosis not present

## 2022-08-29 DIAGNOSIS — I428 Other cardiomyopathies: Secondary | ICD-10-CM | POA: Insufficient documentation

## 2022-08-29 DIAGNOSIS — Z95828 Presence of other vascular implants and grafts: Secondary | ICD-10-CM

## 2022-08-29 DIAGNOSIS — I745 Embolism and thrombosis of iliac artery: Secondary | ICD-10-CM | POA: Insufficient documentation

## 2022-08-29 DIAGNOSIS — I4891 Unspecified atrial fibrillation: Secondary | ICD-10-CM | POA: Diagnosis not present

## 2022-08-29 DIAGNOSIS — I493 Ventricular premature depolarization: Secondary | ICD-10-CM

## 2022-08-29 DIAGNOSIS — F17200 Nicotine dependence, unspecified, uncomplicated: Secondary | ICD-10-CM

## 2022-08-29 DIAGNOSIS — E785 Hyperlipidemia, unspecified: Secondary | ICD-10-CM | POA: Insufficient documentation

## 2022-08-29 LAB — ECHOCARDIOGRAM COMPLETE

## 2022-08-29 LAB — VAS US ABI WITH/WO TBI
Left ABI: 1.25
Right ABI: 1.04

## 2022-08-29 NOTE — Progress Notes (Signed)
Office Note   History of Present Illness   [name] is a 57 y.o. (September 03, 1965) male who presents for surveillance of PAD.  He has a history of right common iliac angioplasty and stenting by Dr. Imogene Burn in 2017.  This was done for lifestyle limiting claudication.  On 07/17/2016 he underwent right common iliac artery thrombectomy with right common femoral bovine patch angioplasty on 07/25/2016 by Dr. Imogene Burn. This was done for a chronically occluded right common iliac artery and stent due to antiphospholipid syndrome with thrombophilia.  He then required right groin hematoma evacuation on 07/25/2016 by Dr. Randie Heinz.  Since these procedures, he has been free of claudication, rest pain, nonhealing wounds of lower extremities.  He returns today for follow-up.  He has been staying very active and has biked about 2000 miles over the past year.  He denies any claudication, rest pain, nonhealing wounds of lower extremities.  He still smokes about 1 PPD.  Current Outpatient Medications  Medication Sig Dispense Refill   carvedilol (COREG) 6.25 MG tablet TAKE 1 TABLET BY MOUTH TWICE DAILY 60 tablet 7   cilostazol (PLETAL) 50 MG tablet TAKE 1 TABLET BY MOUTH TWICE DAILY 180 tablet 3   cyclobenzaprine (FLEXERIL) 10 MG tablet TAKE 1 TABLET BY MOUTH AT BEDTIME AS NEEDED FOR MUSCLE SPASMS 90 tablet 3   enoxaparin (LOVENOX) 120 MG/0.8ML injection INJECT 0.8 mL (120 MG) INTO THE SKIN DAILY AS DIRECTED BY ANTICOAGULATION CLINIC 6.4 mL 0   enoxaparin (LOVENOX) 120 MG/0.8ML injection Inject 0.8 mLs (120 mg total) into the skin daily. PLEASE FOLLOW INSTRUCTION GIVEN BY ANTICOAGULATION CLINIC 6.4 mL 0   esomeprazole (NEXIUM) 20 MG capsule Take 20 mg by mouth daily at 12 noon.     FLUoxetine (PROZAC) 20 MG capsule Take 1 capsule (20 mg total) by mouth daily. 90 capsule 1   Fluticasone-Umeclidin-Vilant 100-62.5-25 MCG/ACT AEPB Inhale 1 puff into the lungs daily. 60 each 11   gabapentin (NEURONTIN) 600 MG tablet Take 1 tablet  (600 mg total) by mouth 3 (three) times daily. 270 tablet 3   rosuvastatin (CRESTOR) 10 MG tablet TAKE 1 TABLET (10 MG TOTAL) BY MOUTH DAILY. 90 tablet 3   warfarin (COUMADIN) 6 MG tablet TAKE 1 TABLET BY MOUTH DAILY EXCEPT TAKE 1/2 TABLET ON MONDAYS AND THURSDAYS OR AS DIRECTED BY ANTICOAGULATION CLINIC 95 tablet 1   No current facility-administered medications for this visit.    REVIEW OF SYSTEMS (negative unless checked):   Cardiac:  []  Chest pain or chest pressure? []  Shortness of breath upon activity? []  Shortness of breath when lying flat? []  Irregular heart rhythm?  Vascular:  []  Pain in calf, thigh, or hip brought on by walking? []  Pain in feet at night that wakes you up from your sleep? []  Blood clot in your veins? []  Leg swelling?  Pulmonary:  []  Oxygen at home? []  Productive cough? []  Wheezing?  Neurologic:  []  Sudden weakness in arms or legs? []  Sudden numbness in arms or legs? []  Sudden onset of difficult speaking or slurred speech? []  Temporary loss of vision in one eye? []  Problems with dizziness?  Gastrointestinal:  []  Blood in stool? []  Vomited blood?  Genitourinary:  []  Burning when urinating? []  Blood in urine?  Psychiatric:  []  Major depression  Hematologic:  []  Bleeding problems? []  Problems with blood clotting?  Dermatologic:  []  Rashes or ulcers?  Constitutional:  []  Fever or chills?  Ear/Nose/Throat:  []  Change in hearing? []  Nose bleeds? []  Sore  throat?  Musculoskeletal:  []  Back pain? []  Joint pain? []  Muscle pain?   Physical Examination   Vitals:   08/29/22 0908  BP: 122/76  Pulse: 89  Resp: 18  Temp: 98 F (36.7 C)  TempSrc: Temporal  SpO2: 99%  Weight: 171 lb 12.8 oz (77.9 kg)  Height: 5\' 10"  (1.778 m)   Body mass index is 24.65 kg/m.  General:  WDWN in NAD; vital signs documented above Gait: Not observed HENT: WNL, normocephalic Pulmonary: normal non-labored breathing Cardiac: regular Abdomen: soft,  NT, no masses Skin: without rashes Vascular Exam/Pulses: brisk DP/PT doppler signals bilaterally Extremities: without ischemic changes, without gangrene , without cellulitis; without open wounds;  Musculoskeletal: no muscle wasting or atrophy  Neurologic: A&O X 3;  No focal weakness or paresthesias are detected Psychiatric:  The pt has Normal affect.  Non-Invasive Vascular imaging   ABI (08/29/2022) R:  ABI: 1.04 (1.15),  PT: tri DP: tri TBI:  0.79 L:  ABI: 1.25 (1.16),  PT: tri DP: tri TBI: 1.16  Right Aortoiliac Duplex (08/29/2022) Patent right common iliac artery stent with 50-99% stenosis. PSV at distal stent 323.  Medical Decision Making   [name] is a 57 y.o. male who presents for surveillance of PAD  Based on the patient's vascular studies, his ABIs on the right have decreased from 1.15 to 1.04.  He still has triphasic flow in his right PT/DP.  Duplex of the right common iliac demonstrates a patent iliac stent with greater than 50% stenosis.  The stenosis was seen at his last visit in 2023, however it has increased.  Previous PSV was 250, today PSV is 323. He is staying very active by biking.  He denies any claudication, rest pain, nonhealing wounds of the lower extremities.  He has brisk Doppler signals bilaterally. He also has palpable femoral pulses I have discussed with the patient that we may need to intervene on his right common iliac stent due to increasing stenosis with a drop in ABIs.  He is agreeable to angiogram, but he would like to wait until after his trip to [place] in mid May.  We can schedule him for aortogram with right lower extremity runoff with possible intervention with Dr.Clark in mid-late May He is on Coumadin for antiphospholipid syndrome.  He will need to bridge with Lovenox before briefly stopping anticoagulation prior to his angiogram.   William Dubonnet PA-C [facility] Office: [phone]  Clinic MD:  Steve Rattler

## 2022-08-30 LAB — ECHOCARDIOGRAM COMPLETE
Area-P 1/2: 3.38 cm2
Height: 70 in
S' Lateral: 3.4 cm
Weight: 2748.8 oz

## 2022-08-31 ENCOUNTER — Telehealth: Payer: Self-pay

## 2022-08-31 NOTE — Telephone Encounter (Signed)
Attempted to reach patient to schedule angiogram, no answer. LVM for patient to return call.

## 2022-09-01 ENCOUNTER — Other Ambulatory Visit: Payer: Self-pay

## 2022-09-01 DIAGNOSIS — T82856A Stenosis of peripheral vascular stent, initial encounter: Secondary | ICD-10-CM

## 2022-09-01 NOTE — Telephone Encounter (Signed)
Patient's wife Raynelle Fanning returned call. Scheduled procedure for 5/30 per request. Instructions provided and she verbalized understanding. Raynelle Fanning stated she will contact PCP office who manages Coumadin for Lovenox bridging. Advised her, if no instructions have been received, contact our office for further instructions. She voiced understanding.

## 2022-09-05 ENCOUNTER — Other Ambulatory Visit: Payer: Self-pay | Admitting: Family Medicine

## 2022-09-05 ENCOUNTER — Other Ambulatory Visit: Payer: Self-pay

## 2022-09-06 ENCOUNTER — Other Ambulatory Visit: Payer: Self-pay

## 2022-09-07 ENCOUNTER — Ambulatory Visit (INDEPENDENT_AMBULATORY_CARE_PROVIDER_SITE_OTHER): Payer: Commercial Managed Care - PPO

## 2022-09-07 DIAGNOSIS — D6861 Antiphospholipid syndrome: Secondary | ICD-10-CM | POA: Diagnosis not present

## 2022-09-07 DIAGNOSIS — Z7901 Long term (current) use of anticoagulants: Secondary | ICD-10-CM | POA: Diagnosis not present

## 2022-09-07 LAB — POCT INR: INR: 2.5 (ref 2.0–3.0)

## 2022-09-07 NOTE — Progress Notes (Signed)
Abdominal aortogram with right lower extremity runoff  scheduled for 5/30. Pt will need a lovenox bridge.  Pt tests at home. Received INR result of 2.5 from [employer] today. Continue 6 mg daily. Recheck in 2 weeks, on 5/2. Sent mychart msg with instructions.

## 2022-09-07 NOTE — Progress Notes (Signed)
I think this was sent to me in error

## 2022-09-07 NOTE — Patient Instructions (Addendum)
Pre visit review using our clinic review tool, if applicable. No additional management support is needed unless otherwise documented below in the visit note.  Continue 6 mg daily. Recheck in 2 weeks, on 5/2.

## 2022-09-08 NOTE — Telephone Encounter (Signed)
Surgery: Abdominal aortogram w/lower extremity Will need lovenox bridge.  Date of Procedure: 10/19/22 Pt current wt: 78 kg CrCl: 100.09 mL/min Current warfarin dose: 6 mg daily  Recommended Lovenox Dose: QD, 120 mg, 9 syringes needed for bridge  Bridge Schedule:  5/25: Take last dose of warfarin 5/26: No warfarin, no Lovenox 5/27: No warfarin, Lovenox AM only 5/28: No warfarin, Lovenox AM only 5/29: No warfarin, Lovenox AM only (BEFORE 7 AM)  5/30: Surgery; NO WARFARIN, NO LOVENOX  5/31: Take 9 mg (1 1/2 tablets) warfarin, Lovenox AM only 6/1: Take 9 mg (1 1/2 tablets) warfarin, Lovenox AM only 6/2: Take 9 mg (1 1/2 tablets) warfarin, Lovenox AM only 6/3: Take 9 mg (1 1/2 tablets) warfarin, Lovenox AM only 6/4: Take 6 mg (1 tablet) warfarin, Lovenox AM only 6/5: Take 6 mg (1 tablet) warfarin, Lovenox AM only 6/6: Recheck INR in the morning, DO NOT TAKE WARFARIN UNTIL INR CHECK

## 2022-09-12 ENCOUNTER — Encounter: Payer: Self-pay | Admitting: Family Medicine

## 2022-09-12 ENCOUNTER — Other Ambulatory Visit (HOSPITAL_COMMUNITY): Payer: Self-pay

## 2022-09-12 ENCOUNTER — Other Ambulatory Visit: Payer: Self-pay

## 2022-09-12 MED ORDER — FLUTICASONE-UMECLIDIN-VILANT 100-62.5-25 MCG/ACT IN AEPB
1.0000 | INHALATION_SPRAY | Freq: Every day | RESPIRATORY_TRACT | 11 refills | Status: DC
  Filled 2022-09-12: qty 60, 30d supply, fill #0
  Filled 2023-01-06: qty 60, 30d supply, fill #1
  Filled 2023-03-14: qty 60, 30d supply, fill #2
  Filled 2023-06-22: qty 60, 30d supply, fill #3
  Filled 2023-08-05: qty 60, 30d supply, fill #4

## 2022-09-17 ENCOUNTER — Other Ambulatory Visit: Payer: Self-pay | Admitting: Cardiology

## 2022-09-18 ENCOUNTER — Other Ambulatory Visit: Payer: Self-pay

## 2022-09-18 ENCOUNTER — Other Ambulatory Visit (HOSPITAL_COMMUNITY): Payer: Self-pay

## 2022-09-18 MED ORDER — CARVEDILOL 6.25 MG PO TABS
6.2500 mg | ORAL_TABLET | Freq: Two times a day (BID) | ORAL | 0 refills | Status: DC
  Filled 2022-09-18: qty 180, 90d supply, fill #0

## 2022-09-21 ENCOUNTER — Ambulatory Visit (INDEPENDENT_AMBULATORY_CARE_PROVIDER_SITE_OTHER): Payer: Commercial Managed Care - PPO

## 2022-09-21 DIAGNOSIS — Z7901 Long term (current) use of anticoagulants: Secondary | ICD-10-CM

## 2022-09-21 LAB — POCT INR: INR: 2.7 (ref 2.0–3.0)

## 2022-09-21 NOTE — Progress Notes (Signed)
Pt tests at home. Received INR result of 2.7 from [employer] today. Continue 6 mg daily. Recheck in 2 weeks, on 5/16. Sent mychart msg with instructions. Will provide lovenox bridge dosing instructions when pt tests in two weeks. Bridge dosing schedule has been approved by PCP.

## 2022-09-21 NOTE — Patient Instructions (Addendum)
Pre visit review using our clinic review tool, if applicable. No additional management support is needed unless otherwise documented below in the visit note.  Continue 6 mg daily. Recheck in 2 weeks, on 5/16.

## 2022-09-25 ENCOUNTER — Other Ambulatory Visit: Payer: Self-pay | Admitting: Family Medicine

## 2022-09-25 DIAGNOSIS — F339 Major depressive disorder, recurrent, unspecified: Secondary | ICD-10-CM

## 2022-09-26 NOTE — Telephone Encounter (Signed)
LOV: 05/01/2022  Last Refill Date: 12/24/2021  Qty: 90  Refill: 1

## 2022-09-27 ENCOUNTER — Other Ambulatory Visit (HOSPITAL_COMMUNITY): Payer: Self-pay

## 2022-09-27 MED ORDER — FLUOXETINE HCL 20 MG PO CAPS
20.0000 mg | ORAL_CAPSULE | Freq: Every day | ORAL | 3 refills | Status: DC
Start: 2022-09-27 — End: 2022-11-10
  Filled 2022-09-27: qty 90, 90d supply, fill #0

## 2022-10-07 LAB — POCT INR: INR: 2.9 (ref 2.0–3.0)

## 2022-10-08 ENCOUNTER — Other Ambulatory Visit: Payer: Self-pay | Admitting: Family Medicine

## 2022-10-09 ENCOUNTER — Other Ambulatory Visit (HOSPITAL_COMMUNITY): Payer: Self-pay

## 2022-10-09 ENCOUNTER — Ambulatory Visit (INDEPENDENT_AMBULATORY_CARE_PROVIDER_SITE_OTHER): Payer: Commercial Managed Care - PPO

## 2022-10-09 DIAGNOSIS — Z7901 Long term (current) use of anticoagulants: Secondary | ICD-10-CM

## 2022-10-09 MED ORDER — ENOXAPARIN SODIUM 120 MG/0.8ML IJ SOSY
120.0000 mg | PREFILLED_SYRINGE | INTRAMUSCULAR | 0 refills | Status: DC
Start: 2022-10-09 — End: 2022-11-10
  Filled 2022-10-09: qty 7.2, 9d supply, fill #0

## 2022-10-09 MED ORDER — ROSUVASTATIN CALCIUM 10 MG PO TABS
10.0000 mg | ORAL_TABLET | Freq: Every day | ORAL | 3 refills | Status: DC
  Filled 2022-10-09: qty 90, 90d supply, fill #0
  Filled 2023-01-14: qty 90, 90d supply, fill #1
  Filled 2023-04-19: qty 90, 90d supply, fill #2
  Filled 2023-07-07 – 2023-07-09 (×2): qty 90, 90d supply, fill #3

## 2022-10-09 NOTE — Progress Notes (Addendum)
Surgery: Abdominal aortogram w/lower extremity  Date of Procedure: 10/19/22 Pt current wt: 78 kg CrCl: 100.09 mL/min Current warfarin dose: 6 mg daily   Recommended Lovenox Dose: QD, 120 mg, 9 syringes needed for bridge   Bridge Schedule:  Continue 6 mg daily until you start the instructions on 5/25.  5/25: Take last dose of warfarin 5/26: No warfarin, no Lovenox 5/27: No warfarin, Lovenox AM only 5/28: No warfarin, Lovenox AM only 5/29: No warfarin, Lovenox AM only (BEFORE 7 AM)   5/30: Surgery; NO WARFARIN, NO LOVENOX   5/31: Take 9 mg (1 1/2 tablets) warfarin, Lovenox AM only 6/1: Take 9 mg (1 1/2 tablets) warfarin, Lovenox AM only 6/2: Take 9 mg (1 1/2 tablets) warfarin, Lovenox AM only 6/3: Take 9 mg (1 1/2 tablets) warfarin, Lovenox AM only 6/4: Take 6 mg (1 tablet) warfarin, Lovenox AM only 6/5: Take 6 mg (1 tablet) warfarin, Lovenox AM only 6/6: Recheck INR in the morning, DO NOT TAKE WARFARIN UNTIL INR CHECK  Lovenox syringes sent to pharmacy. Sent pt a Radio broadcast assistant with instructions.

## 2022-10-09 NOTE — Patient Instructions (Addendum)
Pre visit review using our clinic review tool, if applicable. No additional management support is needed unless otherwise documented below in the visit note.  Continue 6 mg daily until you start the instructions on 5/25.  5/25: Take last dose of warfarin 5/26: No warfarin, no Lovenox 5/27: No warfarin, Lovenox AM only 5/28: No warfarin, Lovenox AM only 5/29: No warfarin, Lovenox AM only (BEFORE 7 AM)   5/30: Surgery; NO WARFARIN, NO LOVENOX   5/31: Take 9 mg (1 1/2 tablets) warfarin, Lovenox AM only 6/1: Take 9 mg (1 1/2 tablets) warfarin, Lovenox AM only 6/2: Take 9 mg (1 1/2 tablets) warfarin, Lovenox AM only 6/3: Take 9 mg (1 1/2 tablets) warfarin, Lovenox AM only 6/4: Take 6 mg (1 tablet) warfarin, Lovenox AM only 6/5: Take 6 mg (1 tablet) warfarin, Lovenox AM only 6/6: Recheck INR in the morning, DO NOT TAKE WARFARIN UNTIL INR CHECK

## 2022-10-18 ENCOUNTER — Ambulatory Visit: Payer: Self-pay

## 2022-10-18 ENCOUNTER — Telehealth: Payer: Self-pay

## 2022-10-18 NOTE — Telephone Encounter (Signed)
Pt tested INR today because he has surgery tomorrow and he has been on a lovenox bridge. No dosing changes will be made. Pt will continue with lovenox bridge instructions after surgery.

## 2022-10-18 NOTE — Progress Notes (Signed)
Erroneous encounter-disregard

## 2022-10-19 ENCOUNTER — Ambulatory Visit (HOSPITAL_COMMUNITY)
Admission: RE | Admit: 2022-10-19 | Discharge: 2022-10-19 | Disposition: A | Discharge status: Home / Self Care | Payer: Commercial Managed Care - PPO | Attending: Vascular Surgery | Admitting: Vascular Surgery

## 2022-10-19 ENCOUNTER — Encounter (HOSPITAL_COMMUNITY)
Admission: RE | Disposition: A | Discharge status: Home / Self Care | Payer: Self-pay | Source: Home / Self Care | Attending: Vascular Surgery

## 2022-10-19 DIAGNOSIS — T82858A Stenosis of vascular prosthetic devices, implants and grafts, initial encounter: Secondary | ICD-10-CM | POA: Insufficient documentation

## 2022-10-19 DIAGNOSIS — F17210 Nicotine dependence, cigarettes, uncomplicated: Secondary | ICD-10-CM | POA: Diagnosis not present

## 2022-10-19 DIAGNOSIS — I708 Atherosclerosis of other arteries: Secondary | ICD-10-CM | POA: Diagnosis not present

## 2022-10-19 DIAGNOSIS — I771 Stricture of artery: Secondary | ICD-10-CM

## 2022-10-19 DIAGNOSIS — Z95820 Peripheral vascular angioplasty status with implants and grafts: Secondary | ICD-10-CM | POA: Diagnosis not present

## 2022-10-19 DIAGNOSIS — Y832 Surgical operation with anastomosis, bypass or graft as the cause of abnormal reaction of the patient, or of later complication, without mention of misadventure at the time of the procedure: Secondary | ICD-10-CM | POA: Diagnosis not present

## 2022-10-19 DIAGNOSIS — Z7901 Long term (current) use of anticoagulants: Secondary | ICD-10-CM | POA: Insufficient documentation

## 2022-10-19 DIAGNOSIS — D6861 Antiphospholipid syndrome: Secondary | ICD-10-CM | POA: Insufficient documentation

## 2022-10-19 DIAGNOSIS — T82856A Stenosis of peripheral vascular stent, initial encounter: Secondary | ICD-10-CM

## 2022-10-19 HISTORY — PX: PERIPHERAL VASCULAR BALLOON ANGIOPLASTY: CATH118281

## 2022-10-19 HISTORY — PX: ABDOMINAL AORTOGRAM W/LOWER EXTREMITY: CATH118223

## 2022-10-19 LAB — POCT I-STAT, CHEM 8
BUN: 7 mg/dL (ref 6–20)
Calcium, Ion: 1.19 mmol/L (ref 1.15–1.40)
Chloride: 104 mmol/L (ref 98–111)
Creatinine, Ser: 1 mg/dL (ref 0.61–1.24)
Glucose, Bld: 123 mg/dL — ABNORMAL HIGH (ref 70–99)
HCT: 48 % (ref 39.0–52.0)
Hemoglobin: 16.3 g/dL (ref 13.0–17.0)
Potassium: 3.8 mmol/L (ref 3.5–5.1)
Sodium: 140 mmol/L (ref 135–145)
TCO2: 23 mmol/L (ref 22–32)

## 2022-10-19 LAB — PROTIME-INR
INR: 1.2 (ref 0.8–1.2)
Prothrombin Time: 15.2 seconds (ref 11.4–15.2)

## 2022-10-19 SURGERY — ABDOMINAL AORTOGRAM W/LOWER EXTREMITY
Anesthesia: LOCAL

## 2022-10-19 MED ORDER — ASPIRIN 81 MG PO CHEW
CHEWABLE_TABLET | ORAL | Status: DC | PRN
  Administered 2022-10-19: 81 mg via ORAL

## 2022-10-19 MED ORDER — ASPIRIN 81 MG PO CHEW
CHEWABLE_TABLET | ORAL | Status: AC
  Filled 2022-10-19: qty 1

## 2022-10-19 MED ORDER — MIDAZOLAM HCL 2 MG/2ML IJ SOLN
INTRAMUSCULAR | Status: AC
  Filled 2022-10-19: qty 2

## 2022-10-19 MED ORDER — IODIXANOL 320 MG/ML IV SOLN
INTRAVENOUS | Status: DC | PRN
  Administered 2022-10-19: 80 mL via INTRA_ARTERIAL

## 2022-10-19 MED ORDER — HEPARIN SODIUM (PORCINE) 1000 UNIT/ML IJ SOLN
INTRAMUSCULAR | Status: DC | PRN
  Administered 2022-10-19: 8000 [IU] via INTRAVENOUS

## 2022-10-19 MED ORDER — FENTANYL CITRATE (PF) 100 MCG/2ML IJ SOLN
INTRAMUSCULAR | Status: AC
  Filled 2022-10-19: qty 2

## 2022-10-19 MED ORDER — LIDOCAINE HCL (PF) 1 % IJ SOLN
INTRAMUSCULAR | Status: DC | PRN
  Administered 2022-10-19: 15 mL via INTRADERMAL

## 2022-10-19 MED ORDER — LIDOCAINE HCL (PF) 1 % IJ SOLN
INTRAMUSCULAR | Status: AC
  Filled 2022-10-19: qty 30

## 2022-10-19 MED ORDER — ACETAMINOPHEN 325 MG PO TABS: 650.0000 mg | ORAL_TABLET | ORAL | Status: DC | PRN

## 2022-10-19 MED ORDER — LABETALOL HCL 5 MG/ML IV SOLN: 10.0000 mg | INTRAVENOUS | Status: DC | PRN

## 2022-10-19 MED ORDER — SODIUM CHLORIDE 0.9 % IV SOLN: INTRAVENOUS | Status: DC

## 2022-10-19 MED ORDER — HEPARIN (PORCINE) IN NACL 1000-0.9 UT/500ML-% IV SOLN
INTRAVENOUS | Status: DC | PRN
  Administered 2022-10-19: 1000 mL

## 2022-10-19 MED ORDER — HYDRALAZINE HCL 20 MG/ML IJ SOLN: 5.0000 mg | INTRAMUSCULAR | Status: DC | PRN

## 2022-10-19 MED ORDER — FENTANYL CITRATE (PF) 100 MCG/2ML IJ SOLN
INTRAMUSCULAR | Status: DC | PRN
  Administered 2022-10-19: 25 ug via INTRAVENOUS

## 2022-10-19 MED ORDER — SODIUM CHLORIDE 0.9% FLUSH: 3.0000 mL | Freq: Two times a day (BID) | INTRAVENOUS | Status: DC

## 2022-10-19 MED ORDER — ASPIRIN 81 MG PO TBEC: 81.0000 mg | DELAYED_RELEASE_TABLET | Freq: Every day | ORAL | 2 refills | Status: AC

## 2022-10-19 MED ORDER — ASPIRIN 81 MG PO TBEC: 81.0000 mg | DELAYED_RELEASE_TABLET | Freq: Every day | ORAL | Status: DC

## 2022-10-19 MED ORDER — SODIUM CHLORIDE 0.9% FLUSH: 3.0000 mL | INTRAVENOUS | Status: DC | PRN

## 2022-10-19 MED ORDER — SODIUM CHLORIDE 0.9 % IV SOLN: 250.0000 mL | INTRAVENOUS | Status: DC | PRN

## 2022-10-19 MED ORDER — ONDANSETRON HCL 4 MG/2ML IJ SOLN: 4.0000 mg | Freq: Four times a day (QID) | INTRAMUSCULAR | Status: DC | PRN

## 2022-10-19 MED ORDER — MIDAZOLAM HCL 2 MG/2ML IJ SOLN
INTRAMUSCULAR | Status: DC | PRN
  Administered 2022-10-19: 1 mg via INTRAVENOUS

## 2022-10-19 MED ORDER — HEPARIN SODIUM (PORCINE) 1000 UNIT/ML IJ SOLN
INTRAMUSCULAR | Status: AC
  Filled 2022-10-19: qty 10

## 2022-10-19 SURGICAL SUPPLY — 17 items
BALLN LUTONIX AV 8X40X75 (BALLOONS) ×2
BALLOON LUTONIX AV 8X40X75 (BALLOONS) IMPLANT
CATH OMNI FLUSH 5F 65CM (CATHETERS) IMPLANT
DEVICE CLOSURE MYNXGRIP 6/7F (Vascular Products) IMPLANT
KIT ENCORE 26 ADVANTAGE (KITS) IMPLANT
KIT MICROPUNCTURE NIT STIFF (SHEATH) IMPLANT
KIT PV (KITS) ×2 IMPLANT
SHEATH CATAPULT 6FR 45 (SHEATH) IMPLANT
SHEATH PINNACLE 5F 10CM (SHEATH) IMPLANT
SHEATH PINNACLE 6F 10CM (SHEATH) IMPLANT
SHEATH PROBE COVER 6X72 (BAG) IMPLANT
STOPCOCK MORSE 400PSI 3WAY (MISCELLANEOUS) IMPLANT
SYR MEDRAD MARK 7 150ML (SYRINGE) ×2 IMPLANT
TRANSDUCER W/STOPCOCK (MISCELLANEOUS) ×2 IMPLANT
TRAY PV CATH (CUSTOM PROCEDURE TRAY) ×2 IMPLANT
TUBING CIL FLEX 10 FLL-RA (TUBING) IMPLANT
WIRE STARTER BENTSON 035X150 (WIRE) IMPLANT

## 2022-10-19 NOTE — H&P (Signed)
History and Physical Interval Note:  10/19/2022 8:29 AM  [name]  has presented today for surgery, with the diagnosis of right iliac artery stent stenosis.  The various methods of treatment have been discussed with the patient and family. After consideration of risks, benefits and other options for treatment, the patient has consented to  Procedure(s): ABDOMINAL AORTOGRAM W/LOWER EXTREMITY (N/A) as a surgical intervention.  The patient's history has been reviewed, patient examined, no change in status, stable for surgery.  I have reviewed the patient's chart and labs.  Questions were answered to the patient's satisfaction.     Cephus Shelling       Office Note     History of Present Illness    [name] is a 57 y.o. (1965-12-24) male who presents for surveillance of PAD.  He has a history of right common iliac angioplasty and stenting by Dr. Imogene Burn in 2017.  This was done for lifestyle limiting claudication.  On 07/17/2016 he underwent right common iliac artery thrombectomy with right common femoral bovine patch angioplasty on 07/25/2016 by Dr. Imogene Burn. This was done for a chronically occluded right common iliac artery and stent due to antiphospholipid syndrome with thrombophilia.  He then required right groin hematoma evacuation on 07/25/2016 by Dr. Randie Heinz.  Since these procedures, he has been free of claudication, rest pain, nonhealing wounds of lower extremities.   He returns today for follow-up.  He has been staying very active and has biked about 2000 miles over the past year.  He denies any claudication, rest pain, nonhealing wounds of lower extremities.   He still smokes about 1 PPD.         Current Outpatient Medications  Medication Sig Dispense Refill   carvedilol (COREG) 6.25 MG tablet TAKE 1 TABLET BY MOUTH TWICE DAILY 60 tablet 7   cilostazol (PLETAL) 50 MG tablet TAKE 1 TABLET BY MOUTH TWICE DAILY 180 tablet 3   cyclobenzaprine (FLEXERIL) 10 MG tablet TAKE 1 TABLET BY  MOUTH AT BEDTIME AS NEEDED FOR MUSCLE SPASMS 90 tablet 3   enoxaparin (LOVENOX) 120 MG/0.8ML injection INJECT 0.8 mL (120 MG) INTO THE SKIN DAILY AS DIRECTED BY ANTICOAGULATION CLINIC 6.4 mL 0   enoxaparin (LOVENOX) 120 MG/0.8ML injection Inject 0.8 mLs (120 mg total) into the skin daily. PLEASE FOLLOW INSTRUCTION GIVEN BY ANTICOAGULATION CLINIC 6.4 mL 0   esomeprazole (NEXIUM) 20 MG capsule Take 20 mg by mouth daily at 12 noon.       FLUoxetine (PROZAC) 20 MG capsule Take 1 capsule (20 mg total) by mouth daily. 90 capsule 1   Fluticasone-Umeclidin-Vilant 100-62.5-25 MCG/ACT AEPB Inhale 1 puff into the lungs daily. 60 each 11   gabapentin (NEURONTIN) 600 MG tablet Take 1 tablet (600 mg total) by mouth 3 (three) times daily. 270 tablet 3   rosuvastatin (CRESTOR) 10 MG tablet TAKE 1 TABLET (10 MG TOTAL) BY MOUTH DAILY. 90 tablet 3   warfarin (COUMADIN) 6 MG tablet TAKE 1 TABLET BY MOUTH DAILY EXCEPT TAKE 1/2 TABLET ON MONDAYS AND THURSDAYS OR AS DIRECTED BY ANTICOAGULATION CLINIC 95 tablet 1    No current facility-administered medications for this visit.      REVIEW OF SYSTEMS (negative unless checked):    Cardiac:  []  Chest pain or chest pressure? []  Shortness of breath upon activity? []  Shortness of breath when lying flat? []  Irregular heart rhythm?   Vascular:  []  Pain in calf, thigh, or hip brought on by walking? []  Pain in feet at night  that wakes you up from your sleep? []  Blood clot in your veins? []  Leg swelling?   Pulmonary:  []  Oxygen at home? []  Productive cough? []  Wheezing?   Neurologic:  []  Sudden weakness in arms or legs? []  Sudden numbness in arms or legs? []  Sudden onset of difficult speaking or slurred speech? []  Temporary loss of vision in one eye? []  Problems with dizziness?   Gastrointestinal:  []  Blood in stool? []  Vomited blood?   Genitourinary:  []  Burning when urinating? []  Blood in urine?   Psychiatric:  []  Major depression   Hematologic:  []   Bleeding problems? []  Problems with blood clotting?   Dermatologic:  []  Rashes or ulcers?   Constitutional:  []  Fever or chills?   Ear/Nose/Throat:  []  Change in hearing? []  Nose bleeds? []  Sore throat?   Musculoskeletal:  []  Back pain? []  Joint pain? []  Muscle pain?     Physical Examination       Vitals:    08/29/22 0908  BP: 122/76  Pulse: 89  Resp: 18  Temp: 98 F (36.7 C)  TempSrc: Temporal  SpO2: 99%  Weight: 171 lb 12.8 oz (77.9 kg)  Height: 5\' 10"  (1.778 m)    Body mass index is 24.65 kg/m.   General:  WDWN in NAD; vital signs documented above Gait: Not observed HENT: WNL, normocephalic Pulmonary: normal non-labored breathing Cardiac: regular Abdomen: soft, NT, no masses Skin: without rashes Vascular Exam/Pulses: brisk DP/PT doppler signals bilaterally Extremities: without ischemic changes, without gangrene , without cellulitis; without open wounds;  Musculoskeletal: no muscle wasting or atrophy       Neurologic: A&O X 3;  No focal weakness or paresthesias are detected Psychiatric:  The pt has Normal affect.   Non-Invasive Vascular imaging    ABI (08/29/2022) R:  ABI: 1.04 (1.15),  PT: tri DP: tri TBI:  0.79 L:  ABI: 1.25 (1.16),  PT: tri DP: tri TBI: 1.16   Right Aortoiliac Duplex (08/29/2022) Patent right common iliac artery stent with 50-99% stenosis. PSV at distal stent 323.   Medical Decision Making    [name] is a 57 y.o. male who presents for surveillance of PAD   Based on the patient's vascular studies, his ABIs on the right have decreased from 1.15 to 1.04.  He still has triphasic flow in his right PT/DP.  Duplex of the right common iliac demonstrates a patent iliac stent with greater than 50% stenosis.  The stenosis was seen at his last visit in 2023, however it has increased.  Previous PSV was 250, today PSV is 323. He is staying very active by biking.  He denies any claudication, rest pain, nonhealing wounds of the lower  extremities.  He has brisk Doppler signals bilaterally. He also has palpable femoral pulses I have discussed with the patient that we may need to intervene on his right common iliac stent due to increasing stenosis with a drop in ABIs.  He is agreeable to angiogram, but he would like to wait until after his trip to [place] in mid May.  We can schedule him for aortogram with right lower extremity runoff with possible intervention with Dr.Torrance Stockley in mid-late May He is on Coumadin for antiphospholipid syndrome.  He will need to bridge with Lovenox before briefly stopping anticoagulation prior to his angiogram.     Loel Dubonnet PA-C [facility] Office: [phone]   Clinic MD: Steve Rattler

## 2022-10-19 NOTE — Op Note (Signed)
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  Ultrasound-guided access left common femoral artery 2.  Aortogram with bifemoral runoff after catheter selection of aorta 3.  Drug-coated balloon angioplasty right common iliac artery stent (8 mm x 40 mm drug-coated Impact) 4.  Right lower extremity arteriogram with runoff after catheter selection of the external iliac artery 5.  Mynx closure of the left common femoral artery 6.  44 minutes of monitored moderate conscious sedation time  Indications: Patient is a 57 year old male that has a history of a right common iliac artery angioplasty with stenting in 2017 for claudication by Dr. Imogene Burn.  He later underwent a right common iliac artery thrombectomy with right common femoral patch angioplasty in 2018 by Dr. Imogene Burn.  Recent duplex imaging showed high-grade stenosis in the distal right common iliac stent with a velocity of over 323.  He presents for angiogram with a focus on the right iliac artery stent after risks and benefits discussed.  Findings:   Ultrasound-guided access left common femoral artery.  Aortogram showed a patent infrarenal aorta with patent renal arteries bilaterally.  Runoff showed that the right common iliac stent had a shelf of high grade stenosis >70% in the distal stent just before the hypogastric.  The right external iliac artery was widely patent.  The stenosis in the distal right common iliac stent was then treated with a 8 mm x 40 mm drug-coated Impact to nominal pressure for 3 minutes.  We then got right lower extremity runoff that showed a widely patent common femoral, profunda, SFA, popliteal artery and two-vessel runoff in the posterior tibial and peroneal artery.  Flow was sluggish.  Mynx closure in the left common  femoral artery.  Palpable right PT pulse at completion.   Procedure:  The patient was identified in the holding area and taken to room 2.  The patient was then placed supine on the table and prepped and draped in the usual sterile fashion.  A time out was called.  Patient received Versed and fentanyl for conscious moderate sedation.  Vital signs were monitored including heart rate, respiratory rate, oxygenation and blood pressure.  I was present for all of moderate sedation.  Ultrasound was used to evaluate the left common femoral artery.  It was patent .  A digital ultrasound image was acquired.  A micropuncture needle was used to access the left common femoral artery under ultrasound guidance.  An 018 wire was advanced without resistance and a micropuncture sheath was placed.  The 018 wire was removed and a benson wire was placed.  The micropuncture sheath was exchanged for a 5 french sheath.  An omniflush catheter was advanced over the wire to the level of L-1.  An abdominal angiogram was obtained.  Next, I used a Bentson wire with the Omni Flush catheter to cross the aortic bifurcation and got my wire into the right external iliac artery.  Upsized to a long 6 [place] Catapault sheath in the left groin over the aortic bifurcation.  Patient was given 100 units/kg IV heparin.  We then selected an 8 mm x 40 mm drug-coated Impact that was placed in the right common iliac stent including across the distal stenosis and this was inflated to nominal pressure for 3 minutes.  Imaging showed improvement in the shelf of stenosis.  We then shot  the remaining right leg runoff after intervention.  Pertinent findings are above.  Wires and catheters were removed and we put a short 6 French sheath in the left groin.  Mynx closure was deployed.  Plan: Excellent results after drug-coated balloon angioplasty of right common iliac stent.  I will arrange follow-up in 1 month in the office.  He can resume his Coumadin tonight.   Lovenox shots can start tomorrow for his bridge.  I have discussed that he should start aspirin and stop the Pletal.   Cephus Shelling, MD [facility]: [phone]

## 2022-10-19 NOTE — Discharge Instructions (Addendum)
Discussed with patient that he can resume his Coumadin tonight on 10/19/2022.  Resume Lovenox shots tomorrow morning on 10/20/22.  He can stop his Pletal.  Start 81 mg aspirin daily.  Will see him in 1 month with ultrasound studies in the office.  Femoral Site Care This sheet gives you information about how to care for yourself after your procedure. Your health care provider may also give you more specific instructions. If you have problems or questions, contact your health care provider. What can I expect after the procedure?  After the procedure, it is common to have: Bruising that usually fades within 1-2 weeks. Tenderness at the site. Follow these instructions at home: Wound care Follow instructions from your health care provider about how to take care of your insertion site. Make sure you: Wash your hands with soap and water before you change your bandage (dressing). If soap and water are not available, use hand sanitizer. Remove your dressing as told by your health care provider. 24 hours Do not take baths, swim, or use a hot tub in 5 days You may shower 24-48 hours after the procedure or as told by your health care provider. Gently wash the site with plain soap and water. Pat the area dry with a clean towel. Do not rub the site. This may cause bleeding. Do not apply powder or lotion to the site. Keep the site clean and dry. Check your femoral site every day for signs of infection. Check for: Redness, swelling, or pain. Fluid or blood. Warmth. Pus or a bad smell. Activity For the first 2-3 days after your procedure, or as long as directed: Avoid climbing stairs as much as possible. Do not squat. Do not lift anything that is heavier than 10 lb (4.5 kg), or the limit that you are told, until your health care provider says that it is safe. For 5 days Rest as directed. Avoid sitting for a long time without moving. Get up to take short walks every 1-2 hours. Do not drive for 24 hours  if you were given a medicine to help you relax (sedative). General instructions Take over-the-counter and prescription medicines only as told by your health care provider. Keep all follow-up visits as told by your health care provider. This is important. Contact a health care provider if you have: A fever or chills. You have redness, swelling, or pain around your insertion site. Get help right away if: The catheter insertion area swells very fast. You pass out. You suddenly start to sweat or your skin gets clammy. The catheter insertion area is bleeding, and the bleeding does not stop when you hold steady pressure on the area. The area near or just beyond the catheter insertion site becomes pale, cool, tingly, or numb. These symptoms may represent a serious problem that is an emergency. Do not wait to see if the symptoms will go away. Get medical help right away. Call your local emergency services (911 in the [place] Do not drive yourself to the hospital. Summary After the procedure, it is common to have bruising that usually fades within 1-2 weeks. Check your femoral site every day for signs of infection. Do not lift anything that is heavier than 10 lb (4.5 kg), or the limit that you are told, until your health care provider says that it is safe. This information is not intended to replace advice given to you by your health care provider. Make sure you discuss any questions you have with your health care  provider. Document Revised: 05/21/2017 Document Reviewed: 05/21/2017 Elsevier Patient Education  2020 [employer].

## 2022-10-20 ENCOUNTER — Encounter (HOSPITAL_COMMUNITY): Payer: Self-pay | Admitting: Vascular Surgery

## 2022-10-26 ENCOUNTER — Ambulatory Visit (INDEPENDENT_AMBULATORY_CARE_PROVIDER_SITE_OTHER): Payer: Commercial Managed Care - PPO

## 2022-10-26 DIAGNOSIS — Z7901 Long term (current) use of anticoagulants: Secondary | ICD-10-CM | POA: Diagnosis not present

## 2022-10-26 DIAGNOSIS — D6861 Antiphospholipid syndrome: Secondary | ICD-10-CM | POA: Diagnosis not present

## 2022-10-26 LAB — POCT INR: INR: 3.2 — AB (ref 2.0–3.0)

## 2022-10-26 NOTE — Progress Notes (Signed)
Pt tests at home. Received INR result of 3.2 from [employer] today. Pt has surgery of ABDOMINAL AORTOGRAM W/LOWER EXTREMITY on 5/30 and was placed on a lovenox bridge. Reduce dose today to take 1/2 tablet and then continue 1 tablet daily and recheck in 2 weeks.  Sent mychart msg with dosing instructions.

## 2022-10-26 NOTE — Patient Instructions (Addendum)
Pre visit review using our clinic review tool, if applicable. No additional management support is needed unless otherwise documented below in the visit note.  Reduce dose today to take 1/2 tablet and then continue 1 tablet daily and recheck in 2 weeks.

## 2022-11-06 ENCOUNTER — Other Ambulatory Visit (HOSPITAL_COMMUNITY): Payer: Self-pay

## 2022-11-06 ENCOUNTER — Encounter: Payer: Self-pay | Admitting: Family Medicine

## 2022-11-06 ENCOUNTER — Other Ambulatory Visit: Payer: Self-pay

## 2022-11-06 ENCOUNTER — Ambulatory Visit (INDEPENDENT_AMBULATORY_CARE_PROVIDER_SITE_OTHER): Payer: Commercial Managed Care - PPO | Admitting: Family Medicine

## 2022-11-06 VITALS — BP 126/84 | HR 82 | Ht 69.0 in | Wt 177.0 lb

## 2022-11-06 DIAGNOSIS — S8012XA Contusion of left lower leg, initial encounter: Secondary | ICD-10-CM | POA: Diagnosis not present

## 2022-11-06 DIAGNOSIS — M79662 Pain in left lower leg: Secondary | ICD-10-CM | POA: Diagnosis not present

## 2022-11-06 NOTE — Progress Notes (Signed)
[name] [address] [place] [address] Phone: [phone] Subjective:   [name], am serving as a scribe for Dr. Antoine Primas.  I'm seeing this patient by the request  of:  Willow Ora, MD  CC: Left calf pain follow-up  BJY:[id number]  [name] is a 57 y.o. male coming in with complaint of L calf pain. Patient states pain has gotten worse to the point where he cannot put weight on it. Morning stretch felt differently after that. Advil not touching the pain.  Patient states that he has difficulty even putting weight on the leg at the moment.  Patient states that it is severe enough that stops him from activities.  Was working him up at night as well.     Past Medical History:  Diagnosis Date   Anticoagulated on Coumadin    Antiphospholipid antibody positive, followed by Dr. Candise Che, Hematology 06/27/2016   Anxiety with depression 06/24/2014   Chronic back pain    ED (erectile dysfunction)    GERD (gastroesophageal reflux disease)    Hemorrhoids, internal, with bleeding    Hiatal hernia    History of adenomatous polyp of colon, 07/08/16, tubular adenomas    History of atrial fibrillation, episode 08/2016    History of cardiomyopathy (HCC) 08/04/2018   ECHO 12/02/18  1. The left ventricle has normal systolic function with an ejection fraction of 60-65%. The cavity size was normal. Left ventricular diastolic parameters were normal.  2. The right ventricle has normal systolic function. The cavity was normal. There is no increase in right ventricular wall thickness.  3. The mitral valve is grossly normal.  4. The aortic valve is tricuspid. Mild thic   History of DVT of lower extremity    Hyperlipidemia    Iron deficiency anemia due to chronic blood loss, thought to be from hemorrhoids while on coumadin, s/p iron infusion and blood transfusion x 2, 2018 2018   Low testosterone in male 05/04/2017   2018, 2019: Decision would be risk vs  benefit. Could increase thrombotic risk especially if he has a polycythemic response.  If he decided to pursue this with an understanding of the risk - would use lowest dose of testosterone to maintain level low normal 300-400.  2020: Secondary polycythemia. Not a candidate.   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[facility]), sober since 04/2016    S/P insertion of iliac artery stent 2018   02-23-2017  right CIA PTA and stent/05-11-2016 in-stent restenosis s/p thrombectomy/2018 thrombectomy and  patch angioplasty right femoral artery    Past Surgical History:  Procedure Laterality Date   ABDOMINAL AORTOGRAM W/LOWER EXTREMITY N/A 10/19/2022   Procedure: ABDOMINAL AORTOGRAM W/LOWER EXTREMITY;  Surgeon: Cephus Shelling, MD;  Location: [facility] INVASIVE CV LAB;  Service: Cardiovascular;  Laterality: N/A;   ANGIOPLASTY ILLIAC ARTERY Right 07/17/2016   Procedure: ANGIOPLASTY RIGHT COMMON ILIAC ARTERY;  Surgeon: Fransisco Hertz, MD;  Location: [facility] OR;  Service: Vascular;  Laterality: Right;   ANTERIOR CERVICAL DECOMP/DISCECTOMY FUSION  07/21/2010   C5 -- C7   CARDIOVASCULAR STRESS TEST  06-14-2016   dr hilty   normal perfusion study w/ no reversible  ischemia/  stress ef 44% but visually looks better , echo ordered (LVEF 30-44%)  , normal LV wall motion    COLONOSCOPY N/A 07/08/2016   Procedure: COLONOSCOPY;  Surgeon: Vida Rigger, MD;  Location: [facility] ENDOSCOPY;  Service: Endoscopy;  Laterality: N/A;   ESOPHAGOGASTRODUODENOSCOPY N/A 07/08/2016   Procedure: ESOPHAGOGASTRODUODENOSCOPY (EGD);  Surgeon: Vida Rigger, MD;  Location: [facility];  Service: Endoscopy;  Laterality: N/A;   GROIN DEBRIDEMENT Right  07/25/2016   Procedure: EVACUATION HEMATOMA RIGHT GROIN;  Surgeon: Maeola Harman, MD;  Location: [facility] OR;  Service: Vascular;  Laterality: Right;   HEMORRHOID SURGERY N/A 12/07/2016   Procedure: 3 COLUMN HEMORRHOIDECTOMY;  Surgeon: Romie Levee, MD;  Location: [facility];  Service: General;  Laterality: N/A;   INTRAOPERATIVE ARTERIOGRAM Right 07/17/2016   Procedure: INTRA OPERATIVE ANGIOGRAM OF RIGHT COMMON ILIAC ARTERY;  Surgeon: Fransisco Hertz, MD;  Location: [facility] OR;  Service: Vascular;  Laterality: Right;   KNEE SURGERY Right 1983   cartilage   PATCH ANGIOPLASTY Right 07/17/2016   Procedure: PATCH ANGIOPLASTY RIGHT FEMORAL ARTERY USING Livia Snellen BIOLOGIC PATCH;  Surgeon: Fransisco Hertz, MD;  Location: [facility] OR;  Service: Vascular;  Laterality: Right;   PERIPHERAL VASCULAR BALLOON ANGIOPLASTY  10/19/2022   Procedure: PERIPHERAL VASCULAR BALLOON ANGIOPLASTY;  Surgeon: Cephus Shelling, MD;  Location: [facility] INVASIVE CV LAB;  Service: Cardiovascular;;   PERIPHERAL VASCULAR CATHETERIZATION N/A 02/24/2016   Procedure: Abdominal Aortogram w/Lower Extremity;  Surgeon: Fransisco Hertz, MD;  Location: [facility] INVASIVE CV LAB;  Service: Cardiovascular;  Laterality: N/A;   PERIPHERAL VASCULAR CATHETERIZATION Right 02/24/2016   Procedure: Peripheral Vascular Intervention;  Surgeon: Fransisco Hertz, MD;  Location: [facility] INVASIVE CV LAB;  Service: Cardiovascular;  Laterality: Right;  Common iliac   PERIPHERAL VASCULAR CATHETERIZATION N/A 05/11/2016   Procedure: Abdominal Aortogram;  Surgeon: Fransisco Hertz, MD;  Location: [facility] INVASIVE CV LAB;  Service: Cardiovascular;  Laterality: N/A;   PERIPHERAL VASCULAR CATHETERIZATION N/A 05/11/2016   Procedure: Lower Extremity Angiography;  Surgeon: Fransisco Hertz, MD;  Location: [facility] INVASIVE CV LAB;  Service: Cardiovascular;  Laterality: N/A;   POSTERIOR CERVICAL FUSION/FORAMINOTOMY N/A 07/05/2018   Procedure: Posterior cervical fusion with lateral mass fixation - Cervical  five-Cervical six, Cervical six-Cervical seven;  Surgeon: Julio Sicks, MD;  Location: [facility] OR;  Service: Neurosurgery;  Laterality: N/A;   THROMBECTOMY FEMORAL ARTERY Right 07/17/2016   Procedure: THROMBECTOMY RIGHT FEMORAL ARTERY;  Surgeon: Fransisco Hertz, MD;  Location: [facility] OR;  Service: Vascular;  Laterality: Right;   TRANSTHORACIC ECHOCARDIOGRAM  06-20-2016   dr hilty   EF 55-60%,  grade 1 diastolic dysfunction, mild TR   Social History   Socioeconomic History   Marital status: Married    Spouse name: Raynelle Fanning   Number of children: Not on file   Years of education: Not on file   Highest education level: Not on file  Occupational History   Occupation: Disabled  Tobacco Use   Smoking status: Every Day    Packs/day: 1.00    Years: 42.00    Additional pack years: 0.00    Total pack years: 42.00    Types: Cigarettes, E-cigarettes   Smokeless tobacco: Never  Vaping Use   Vaping Use: Never used  Substance and Sexual Activity   Alcohol use: No    Comment: Sober since 2017   Drug use: No   Sexual activity: Yes    Partners: Female  Other Topics Concern   Not on file  Social History  Narrative   Not on file   Social Determinants of Health   Financial Resource Strain: Not on file  Food Insecurity: Not on file  Transportation Needs: Not on file  Physical Activity: Not on file  Stress: Not on file  Social Connections: Not on file   Allergies  Allergen Reactions   Cymbalta [Duloxetine Hcl] Other (See Comments)    Headaches   Family History  Problem Relation Age of Onset   Lung cancer Father      Current Outpatient Medications (Cardiovascular):    carvedilol (COREG) 6.25 MG tablet, Take 1 tablet (6.25 mg total) by mouth 2 (two) times daily.   rosuvastatin (CRESTOR) 10 MG tablet, Take 1 tablet (10 mg total) by mouth daily. (Patient taking differently: Take 10 mg by mouth daily with lunch.)  Current Outpatient Medications (Respiratory):    Fluticasone-Umeclidin-Vilant  100-62.5-25 MCG/ACT AEPB, Inhale 1 puff into the lungs daily. (Patient taking differently: Inhale 1 puff into the lungs daily in the afternoon.)   triamcinolone (NASACORT ALLERGY 24HR) 55 MCG/ACT AERO nasal inhaler, Place 1-2 sprays into the nose daily.  Current Outpatient Medications (Analgesics):    aspirin EC 81 MG tablet, Take 1 tablet (81 mg total) by mouth daily. Swallow whole.  Current Outpatient Medications (Hematological):    warfarin (COUMADIN) 6 MG tablet, TAKE 1 TABLET BY MOUTH DAILY EXCEPT TAKE 1/2 TABLET ON MONDAYS AND THURSDAYS OR AS DIRECTED BY ANTICOAGULATION CLINIC (Patient taking differently: Take 6 mg by mouth daily in the afternoon.)  Current Outpatient Medications (Other):    doxycycline (VIBRA-TABS) 100 MG tablet, Take 1 tablet (100 mg total) by mouth 2 (two) times daily.   Cholecalciferol (VITAMIN D3) 50 MCG CAPS, Take 2,000 Units by mouth daily at 12 noon.   cyclobenzaprine (FLEXERIL) 10 MG tablet, Take 1 tablet (10 mg total) by mouth at bedtime as needed for muscle spasms.   esomeprazole (NEXIUM) 20 MG capsule, Take 20 mg by mouth daily before lunch.   FLUoxetine (PROZAC) 20 MG capsule, Take 1 capsule (20 mg total) by mouth every evening.   Reviewed prior external information including notes and imaging from  primary care provider As well as notes that were available from care everywhere and other healthcare systems.  Past medical history, social, surgical and family history all reviewed in electronic medical record.  No pertanent information unless stated regarding to the chief complaint.   Review of Systems:  No headache, visual changes, nausea, vomiting, diarrhea, constipation, dizziness, abdominal pain, skin rash, fevers, chills, night sweats, weight loss, swollen lymph nodes, body aches, joint swelling, chest pain, shortness of breath, mood changes. POSITIVE muscle aches, swelling of the lower extremity  Objective  Blood pressure 128/84, pulse (!) 106, height  5\' 9"  (1.753 m), weight 177 lb (80.3 kg), SpO2 97 %.   General: No apparent distress alert and oriented x3 mood and affect normal, dressed appropriately.  HEENT: Pupils equal, extraocular movements intact  Respiratory: Patient's speak in full sentences and does not appear short of breath  Patient does have significant swelling and tightness noted of the lower extremity on the left leg.  Hematoma is still noted.  Patient does have some weeping from the opening sore.  No significant pus or discharge noted though.  Patient's calf is significantly tight.  Unable to ambulate secondary to pain this does appear to be more swelling than previous exam.  Does have some very mild improvement of the surrounding the bruising that was noted previously.   Procedure: Real-time Ultrasound  Guided aspiration of hematoma of the left lower leg Device: GE Logiq Q7 Ultrasound guided injection is preferred based studies that show increased duration, increased effect, greater accuracy, decreased procedural pain, increased response rate, and decreased cost with ultrasound guided versus blind injection.  Verbal informed consent obtained.  Time-out conducted.  Noted no overlying erythema, induration, or other signs of local infection.  Skin prepped in a sterile fashion.  Local anesthesia: Topical Ethyl chloride.  With sterile technique and under real time ultrasound guidance: With an 18-gauge 1-1/2 to 1 inch needle patient was injected with 1 cc of 0.5% Marcaine and then had aspiration of 180 cc of venous blood. Completed without difficulty  Pain immediately improved suggesting accurate placement but still had difficulty with some pain in the posterior aspect of the calf Advised to call if fevers/chills, erythema, induration, drainage, or persistent bleeding.  Impression: Technically successful ultrasound guided aspiration.   Impression and Recommendations:     The above documentation has been reviewed and is accurate  and complete Judi Saa, DO

## 2022-11-06 NOTE — Patient Instructions (Addendum)
Arnica lotion for bruising See you on the 24th
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
## 2022-11-06 NOTE — Assessment & Plan Note (Addendum)
Left lower leg does have a fairly large hematoma noted.  Does have some coagulation already noted at this time.  Patient is on anticoagulation. We discussed the possibility of aspiration.  Patient wants to avoid surgical management.  We discussed compression and covering the skin where there is some mild weeping.  Discussed if any signs of infectious etiology would need to seek medical attention immediately.  Follow-up with me again in 1 week to make sure it is continuing to decrease in size

## 2022-11-08 ENCOUNTER — Other Ambulatory Visit (HOSPITAL_COMMUNITY): Payer: Self-pay

## 2022-11-09 ENCOUNTER — Ambulatory Visit (INDEPENDENT_AMBULATORY_CARE_PROVIDER_SITE_OTHER): Payer: Commercial Managed Care - PPO

## 2022-11-09 DIAGNOSIS — Z7901 Long term (current) use of anticoagulants: Secondary | ICD-10-CM | POA: Diagnosis not present

## 2022-11-09 LAB — POCT INR: INR: 2.7 (ref 2.0–3.0)

## 2022-11-09 NOTE — Patient Instructions (Addendum)
Pre visit review using our clinic review tool, if applicable. No additional management support is needed unless otherwise documented below in the visit note.  Continue 1 tablet daily and retest in 3 weeks, on  due to July 4th holiday.

## 2022-11-09 NOTE — Progress Notes (Addendum)
Pt tests at home. Received INR result of 3.2 from [employer] today. Continue 1 tablet daily and retest in 3 weeks, on 7/11, due to July 4th holiday. Sent mychart msg with dosing and retesting instructions.

## 2022-11-10 ENCOUNTER — Other Ambulatory Visit (HOSPITAL_COMMUNITY): Payer: Self-pay

## 2022-11-10 ENCOUNTER — Encounter: Payer: Self-pay | Admitting: Family Medicine

## 2022-11-10 ENCOUNTER — Ambulatory Visit (INDEPENDENT_AMBULATORY_CARE_PROVIDER_SITE_OTHER): Payer: Commercial Managed Care - PPO | Admitting: Family Medicine

## 2022-11-10 VITALS — BP 120/74 | HR 80 | Temp 98.1°F | Ht 69.0 in | Wt 176.4 lb

## 2022-11-10 DIAGNOSIS — Z7901 Long term (current) use of anticoagulants: Secondary | ICD-10-CM | POA: Diagnosis not present

## 2022-11-10 DIAGNOSIS — G894 Chronic pain syndrome: Secondary | ICD-10-CM | POA: Diagnosis not present

## 2022-11-10 DIAGNOSIS — D751 Secondary polycythemia: Secondary | ICD-10-CM | POA: Diagnosis not present

## 2022-11-10 DIAGNOSIS — F339 Major depressive disorder, recurrent, unspecified: Secondary | ICD-10-CM | POA: Diagnosis not present

## 2022-11-10 DIAGNOSIS — E782 Mixed hyperlipidemia: Secondary | ICD-10-CM | POA: Diagnosis not present

## 2022-11-10 DIAGNOSIS — I779 Disorder of arteries and arterioles, unspecified: Secondary | ICD-10-CM

## 2022-11-10 DIAGNOSIS — S8012XA Contusion of left lower leg, initial encounter: Secondary | ICD-10-CM

## 2022-11-10 DIAGNOSIS — R760 Raised antibody titer: Secondary | ICD-10-CM | POA: Diagnosis not present

## 2022-11-10 DIAGNOSIS — F17218 Nicotine dependence, cigarettes, with other nicotine-induced disorders: Secondary | ICD-10-CM

## 2022-11-10 DIAGNOSIS — I493 Ventricular premature depolarization: Secondary | ICD-10-CM

## 2022-11-10 DIAGNOSIS — J439 Emphysema, unspecified: Secondary | ICD-10-CM

## 2022-11-10 DIAGNOSIS — Z0000 Encounter for general adult medical examination without abnormal findings: Secondary | ICD-10-CM

## 2022-11-10 LAB — LIPID PANEL
Cholesterol: 148 mg/dL (ref 0–200)
HDL: 31.4 mg/dL — ABNORMAL LOW (ref >39.00)
LDL Cholesterol: 89 mg/dL (ref 0–99)
NonHDL: 116.72
Total CHOL/HDL Ratio: 5
Triglycerides: 138 mg/dL (ref 0.0–149.0)
VLDL: 27.6 mg/dL (ref 0.0–40.0)

## 2022-11-10 LAB — CBC WITH DIFFERENTIAL/PLATELET
Basophils Absolute: 0.1 10*3/uL (ref 0.0–0.1)
Basophils Relative: 0.8 % (ref 0.0–3.0)
Eosinophils Absolute: 0.4 10*3/uL (ref 0.0–0.7)
Eosinophils Relative: 3.8 % (ref 0.0–5.0)
HCT: 40.6 % (ref 39.0–52.0)
Hemoglobin: 13.6 g/dL (ref 13.0–17.0)
Lymphocytes Relative: 20.3 % (ref 12.0–46.0)
Lymphs Abs: 2.4 10*3/uL (ref 0.7–4.0)
MCHC: 33.5 g/dL (ref 30.0–36.0)
MCV: 98.2 fl (ref 78.0–100.0)
Monocytes Absolute: 0.5 10*3/uL (ref 0.1–1.0)
Monocytes Relative: 4.2 % (ref 3.0–12.0)
Neutro Abs: 8.3 10*3/uL — ABNORMAL HIGH (ref 1.4–7.7)
Neutrophils Relative %: 70.9 % (ref 43.0–77.0)
Platelets: 282 10*3/uL (ref 150.0–400.0)
RBC: 4.13 Mil/uL — ABNORMAL LOW (ref 4.22–5.81)
RDW: 13.2 % (ref 11.5–15.5)
WBC: 11.7 10*3/uL — ABNORMAL HIGH (ref 4.0–10.5)

## 2022-11-10 LAB — COMPREHENSIVE METABOLIC PANEL
ALT: 11 U/L (ref 0–53)
AST: 16 U/L (ref 0–37)
Albumin: 3.7 g/dL (ref 3.5–5.2)
Alkaline Phosphatase: 79 U/L (ref 39–117)
BUN: 9 mg/dL (ref 6–23)
CO2: 29 mEq/L (ref 19–32)
Calcium: 9.1 mg/dL (ref 8.4–10.5)
Chloride: 104 mEq/L (ref 96–112)
Creatinine, Ser: 0.95 mg/dL (ref 0.40–1.50)
GFR: 90.21 mL/min (ref >60.00)
Glucose, Bld: 97 mg/dL (ref 70–99)
Potassium: 4.3 mEq/L (ref 3.5–5.1)
Sodium: 140 mEq/L (ref 135–145)
Total Bilirubin: 0.8 mg/dL (ref 0.2–1.2)
Total Protein: 6.5 g/dL (ref 6.0–8.3)

## 2022-11-10 LAB — TSH: TSH: 1.53 u[IU]/mL (ref 0.35–5.50)

## 2022-11-10 MED ORDER — CYCLOBENZAPRINE HCL 10 MG PO TABS
10.0000 mg | ORAL_TABLET | Freq: Every evening | ORAL | 3 refills | Status: DC | PRN
  Filled 2022-11-10: qty 90, fill #0
  Filled 2023-03-09: qty 90, 90d supply, fill #0

## 2022-11-10 MED ORDER — FLUOXETINE HCL 20 MG PO CAPS: 20.0000 mg | ORAL_CAPSULE | Freq: Every evening | ORAL | Status: DC
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
## 2022-11-13 ENCOUNTER — Ambulatory Visit (INDEPENDENT_AMBULATORY_CARE_PROVIDER_SITE_OTHER): Payer: Commercial Managed Care - PPO

## 2022-11-13 ENCOUNTER — Ambulatory Visit (INDEPENDENT_AMBULATORY_CARE_PROVIDER_SITE_OTHER): Payer: Commercial Managed Care - PPO | Admitting: Family Medicine

## 2022-11-13 ENCOUNTER — Ambulatory Visit: Payer: Commercial Managed Care - PPO | Admitting: Cardiology

## 2022-11-13 ENCOUNTER — Telehealth: Payer: Self-pay

## 2022-11-13 ENCOUNTER — Other Ambulatory Visit: Payer: Self-pay | Admitting: *Deleted

## 2022-11-13 ENCOUNTER — Other Ambulatory Visit (INDEPENDENT_AMBULATORY_CARE_PROVIDER_SITE_OTHER): Payer: Commercial Managed Care - PPO

## 2022-11-13 ENCOUNTER — Other Ambulatory Visit (HOSPITAL_COMMUNITY): Payer: Self-pay

## 2022-11-13 ENCOUNTER — Other Ambulatory Visit: Payer: Self-pay

## 2022-11-13 ENCOUNTER — Encounter: Payer: Self-pay | Admitting: Family Medicine

## 2022-11-13 VITALS — BP 128/84 | HR 106 | Ht 69.0 in | Wt 177.0 lb

## 2022-11-13 DIAGNOSIS — S8012XA Contusion of left lower leg, initial encounter: Secondary | ICD-10-CM | POA: Diagnosis not present

## 2022-11-13 DIAGNOSIS — Z7901 Long term (current) use of anticoagulants: Secondary | ICD-10-CM

## 2022-11-13 DIAGNOSIS — M79662 Pain in left lower leg: Secondary | ICD-10-CM

## 2022-11-13 DIAGNOSIS — M7989 Other specified soft tissue disorders: Secondary | ICD-10-CM | POA: Diagnosis not present

## 2022-11-13 DIAGNOSIS — I779 Disorder of arteries and arterioles, unspecified: Secondary | ICD-10-CM

## 2022-11-13 LAB — POCT INR: INR: 8 — AB (ref 2.0–3.0)

## 2022-11-13 LAB — PROTIME-INR
INR: 11.6 ratio (ref 0.8–1.0)
Prothrombin Time: 109.5 s (ref 9.6–13.1)

## 2022-11-13 MED ORDER — DOXYCYCLINE HYCLATE 100 MG PO TABS
100.0000 mg | ORAL_TABLET | Freq: Two times a day (BID) | ORAL | 0 refills | Status: DC
  Filled 2022-11-13 (×2): qty 28, 14d supply, fill #0

## 2022-11-13 MED ORDER — PHYTONADIONE 5 MG PO TABS
ORAL_TABLET | ORAL | 0 refills | Status: DC
Start: 2022-11-13 — End: 2023-02-28
  Filled 2022-11-13: qty 1, fill #0
  Filled 2022-11-13: qty 1, 2d supply, fill #0
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
## 2022-11-13 NOTE — Patient Instructions (Addendum)
Drained hematoma  Doxycycline 2x a day for 14 days Schedule for this Friday .

## 2022-11-13 NOTE — Telephone Encounter (Signed)
INR 11.6   PT 109.5  [place] from Lab

## 2022-11-13 NOTE — Telephone Encounter (Signed)
Pt contacted coumadin clinic today. Pt reports a leg injury, hit on shin with pedal, two weeks ago. He reports it immediately caused a hematoma. He was assessed by sports medicine and pt wanted to wait before proceeding with any procedure concerning the hematoma.  Pt saw sports medicine today again and hematoma was drained and he was prescribed doxycycline (potential interaction with warfarin.)  Pt also reported since the accident he has been taking ibuprofen daily.  Pt said he had a headache today and decided to test INR (he has his own machine). INR today is >8.0. Advised pt to hold all warfarin until advised to restart. Advised INR POCT machines tops out at 8.0 and his INR could be higher. Advised need for lab INR. Pt's wife called to report she is currently on the way home to pick up pt to take to [facility] lab for INR draw. She reports pt has not started doxycycline yet, she has not picked it up yet. Pt denies any bleeding currently. Advised if any s/s of bleeding or abnormal bruising to go to the ER. Advised hold all warfarin until notified to restart. Pt verbalized understanding.   Added order for lab INR. Pt will let coumadin clinic know if order can be added to prior blood draw, and if not when he can get another lab draw for STAT INR.

## 2022-11-13 NOTE — Patient Instructions (Addendum)
Pre visit review using our clinic review tool, if applicable. No additional management support is needed unless otherwise documented below in the visit note.  Hold all warfarin until advised to restart.

## 2022-11-13 NOTE — Telephone Encounter (Signed)
Result from lab INR is 11.6.  Contacted pt and advised to take 1/2 tablet vitamin K, which was sent to [facility]. Advised hold all warfarin and retest on Thursday, 6/27. Advised if any s/s of abnormal bruising or bleeding go to the ER immediately. Advised to hold other 1/2 tablet of vitamin K and do not take unless instructed to take. Advised pt to hold taking new abx, doxycycline, until tomorrow. Pt verbalized understanding.

## 2022-11-13 NOTE — Telephone Encounter (Signed)
Discussed with PCP. Unsure when result will return being late in the afternoon. Want the pt to be prepared incase vitamin K tablet is needed. PCP advised to send in script now for vitamin K.  Contacted pt's wife and advised and she agreed it would be best to have the tablet on hand. Advised her pt is not to take the vitamin K unless advised by coumadin clinic. [name] verbalized understanding.  She reports she is about 15 minutes from the lab.   Placed order for vitamin K tablet with cosign required. PCP is aware.

## 2022-11-13 NOTE — Progress Notes (Signed)
Discussed with you: will take vit K and fu in 48 hours with retest. To ER for any bleeding.  Carollee Herter will contact pt.

## 2022-11-13 NOTE — Telephone Encounter (Signed)
Discussed with Sherrie George who will contact Loraine Leriche and give instructions for vit K and f/u

## 2022-11-13 NOTE — Assessment & Plan Note (Signed)
Patient has worsening hematoma again.  Patient is on Coumadin.  We discussed with patient that we may need to consider INR being close to for some time.  We discussed potential compression.  Did have some erythema surrounding the area so we will cover for any type of possible infectious etiology.  Blood was aspirated did not show any type of infection and seem to be just venous blood.  Patient continued to have difficulty walking secondary to posterior pain and could have had a muscle injury secondary to the tightness patient was having.  We discussed with patient though that worsening symptoms to seek medical attention.  X-rays ordered today for tib-fib views to make sure there is no sign of any bone involvement.  Patient's is going to follow-up with me at the end of the week to see if any significant reaccumulation happens again.  If it does happen too quickly will need to consider the possibility of referral to orthopedic surgery for a possible drain placement.

## 2022-11-13 NOTE — Progress Notes (Signed)
Result from lab INR is 11.6.   Contacted pt and advised to take 1/2 tablet vitamin K, which was sent to [facility]. Advised hold all warfarin and retest on Thursday, 6/27. Advised if any s/s of abnormal bruising or bleeding go to the ER immediately. Advised to hold other 1/2 tablet of vitamin K and do not take unless instructed to take. Advised pt to hold taking new abx, doxycycline, until tomorrow. Pt verbalized understanding.      PCP is aware of INR and advised with for pt instructions.

## 2022-11-14 ENCOUNTER — Other Ambulatory Visit: Payer: Self-pay | Admitting: Family Medicine

## 2022-11-14 DIAGNOSIS — S8012XA Contusion of left lower leg, initial encounter: Secondary | ICD-10-CM

## 2022-11-14 DIAGNOSIS — M79605 Pain in left leg: Secondary | ICD-10-CM | POA: Diagnosis not present

## 2022-11-14 NOTE — Progress Notes (Signed)
See mychart note Dear Mr. [name], Your lab work looks good.  I reviewed your very elevated INR yesterday and have spoken with Jasmine December for follow up. Let me know if you need anything. Sincerely, Dr. Mardelle Matte

## 2022-11-16 ENCOUNTER — Ambulatory Visit (INDEPENDENT_AMBULATORY_CARE_PROVIDER_SITE_OTHER): Payer: Commercial Managed Care - PPO

## 2022-11-16 DIAGNOSIS — Z7901 Long term (current) use of anticoagulants: Secondary | ICD-10-CM | POA: Diagnosis not present

## 2022-11-16 DIAGNOSIS — M79662 Pain in left lower leg: Secondary | ICD-10-CM | POA: Diagnosis not present

## 2022-11-16 DIAGNOSIS — M25562 Pain in left knee: Secondary | ICD-10-CM | POA: Diagnosis not present

## 2022-11-16 LAB — POCT INR: INR: 1.4 — AB (ref 2.0–3.0)

## 2022-11-16 NOTE — Patient Instructions (Signed)
Pre visit review using our clinic review tool, if applicable. No additional management support is needed unless otherwise documented below in the visit note. 

## 2022-11-16 NOTE — Progress Notes (Signed)
Pt has been holding warfarin since 6/24 due to INR of 11.6, caused by ibuprofen consumption. Pt has been educated on interaction with warfarin and ibuprofen.  Pt reports he was also placed on doxycycline due to leg infection and will take it for 2 weeks. Potential interaction with this abx, so pt will test on Tues of next week.   Pt will restart his prior dosing and retest next week. Continue 6 mg daily. Recheck INR ion 7/2. Contacted pt by phone and advised. Pt verbalized understanding.

## 2022-11-16 NOTE — Progress Notes (Signed)
6/27 @ 1433, pt sent a mychart msg reporting he was started on an additional abx, levaquin.   Made additional dosing changes to adjust for abx interaction. Sent pt mychart msg with instructions.

## 2022-11-17 ENCOUNTER — Ambulatory Visit: Payer: Commercial Managed Care - PPO | Admitting: Family Medicine

## 2022-11-21 ENCOUNTER — Ambulatory Visit (INDEPENDENT_AMBULATORY_CARE_PROVIDER_SITE_OTHER): Payer: Commercial Managed Care - PPO

## 2022-11-21 DIAGNOSIS — Z7901 Long term (current) use of anticoagulants: Secondary | ICD-10-CM

## 2022-11-21 DIAGNOSIS — M79662 Pain in left lower leg: Secondary | ICD-10-CM | POA: Diagnosis not present

## 2022-11-21 LAB — POCT INR: INR: 1.5 — AB (ref 2.0–3.0)

## 2022-11-21 NOTE — Progress Notes (Signed)
Pt tests at home. Result is placed on [employer] website by pt. INR today is 1.5 Dosing was reduced with last encounter due to potential interaction of abx, doxycycline. It appears this is not interacting so pt will increase dose today and tomorrow and resume normal dosing because he was stable on this dose for several months.  Increase dose today and tomorrow to take 1 1/2 tablets and then continue 1 tablet daily. Recheck in 1 week.  Sent pt a Radio broadcast assistant with dosing instructions.

## 2022-11-21 NOTE — Patient Instructions (Addendum)
Pre visit review using our clinic review tool, if applicable. No additional management support is needed unless otherwise documented below in the visit note.  Increase dose today and tomorrow to take 1 1/2 tablets and then continue 1 tablet daily. Recheck in 1 week.

## 2022-11-27 NOTE — Progress Notes (Unsigned)
Patient name: [name] MRN: [id number] DOB: 10-31-1965 Sex: male  REASON FOR CONSULT: Follow-up after recent right common iliac artery DCB for high-grade stenosis  HPI: [name] is a 57 y.o. male, with multiple medical problems that presents for follow-up after recent right common iliac artery drug-coated balloon angioplasty for high-grade in-stent stenosis.  On 10/19/2022 he underwent DCB of the right common iliac stent that had a high-grade stenosis with a velocity of 323.  Previously had stent placed in 2017 by Dr. Imogene Burn for claudication in the right common iliac artery.  He later underwent a right common iliac artery thrombectomy with right common femoral patch angioplasty in 2018 by Dr. Imogene Burn.   Past Medical History:  Diagnosis Date   Anticoagulated on Coumadin    Antiphospholipid antibody positive, followed by Dr. Candise Che, Hematology 06/27/2016   Anxiety with depression 06/24/2014   Chronic back pain    ED (erectile dysfunction)    GERD (gastroesophageal reflux disease)    Hemorrhoids, internal, with bleeding    Hiatal hernia    History of adenomatous polyp of colon, 07/08/16, tubular adenomas    History of atrial fibrillation, episode 08/2016    History of cardiomyopathy (HCC) 08/04/2018   ECHO 12/02/18  1. The left ventricle has normal systolic function with an ejection fraction of 60-65%. The cavity size was normal. Left ventricular diastolic parameters were normal.  2. The right ventricle has normal systolic function. The cavity was normal. There is no increase in right ventricular wall thickness.  3. The mitral valve is grossly normal.  4. The aortic valve is tricuspid. Mild thic   History of DVT of lower extremity    Hyperlipidemia    Iron deficiency anemia due to chronic blood loss, thought to be from hemorrhoids while on coumadin, s/p iron infusion and blood transfusion x 2, 2018 2018   Low testosterone in male 05/04/2017   2018, 2019: Decision would be risk vs benefit.  Could increase thrombotic risk especially if he has a polycythemic response.  If he decided to pursue this with an understanding of the risk - would use lowest dose of testosterone to maintain level low normal 300-400.  2020: Secondary polycythemia. Not a candidate.   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[facility]), sober since 04/2016    S/P insertion of iliac artery stent 2018   02-23-2017  right CIA PTA and stent/05-11-2016 in-stent restenosis s/p thrombectomy/2018 thrombectomy and  patch angioplasty right femoral artery     Past Surgical History:  Procedure Laterality Date   ABDOMINAL AORTOGRAM W/LOWER EXTREMITY N/A 10/19/2022   Procedure: ABDOMINAL AORTOGRAM W/LOWER EXTREMITY;  Surgeon: Cephus Shelling, MD;  Location: [facility] INVASIVE CV LAB;  Service: Cardiovascular;  Laterality: N/A;   ANGIOPLASTY ILLIAC ARTERY Right 07/17/2016   Procedure: ANGIOPLASTY RIGHT COMMON ILIAC ARTERY;  Surgeon: Fransisco Hertz, MD;  Location: [facility] OR;  Service: Vascular;  Laterality: Right;   ANTERIOR CERVICAL DECOMP/DISCECTOMY FUSION  07/21/2010   C5 -- C7   CARDIOVASCULAR STRESS TEST  06-14-2016   dr hilty   normal perfusion study w/ no reversible ischemia/  stress ef 44% but visually looks better , echo ordered (LVEF 30-44%)  , normal  LV wall motion    COLONOSCOPY N/A 07/08/2016   Procedure: COLONOSCOPY;  Surgeon: Vida Rigger, MD;  Location: [facility] ENDOSCOPY;  Service: Endoscopy;  Laterality: N/A;   ESOPHAGOGASTRODUODENOSCOPY N/A 07/08/2016   Procedure: ESOPHAGOGASTRODUODENOSCOPY (EGD);  Surgeon: Vida Rigger, MD;  Location: [facility]COPY;  Service: Endoscopy;  Laterality: N/A;   GROIN DEBRIDEMENT Right 07/25/2016    Procedure: EVACUATION HEMATOMA RIGHT GROIN;  Surgeon: Maeola Harman, MD;  Location: [facility] OR;  Service: Vascular;  Laterality: Right;   HEMORRHOID SURGERY N/A 12/07/2016   Procedure: 3 COLUMN HEMORRHOIDECTOMY;  Surgeon: Romie Levee, MD;  Location: [facility];  Service: General;  Laterality: N/A;   INTRAOPERATIVE ARTERIOGRAM Right 07/17/2016   Procedure: INTRA OPERATIVE ANGIOGRAM OF RIGHT COMMON ILIAC ARTERY;  Surgeon: Fransisco Hertz, MD;  Location: [facility] OR;  Service: Vascular;  Laterality: Right;   KNEE SURGERY Right 1983   cartilage   PATCH ANGIOPLASTY Right 07/17/2016   Procedure: PATCH ANGIOPLASTY RIGHT FEMORAL ARTERY USING Livia Snellen BIOLOGIC PATCH;  Surgeon: Fransisco Hertz, MD;  Location: [facility] OR;  Service: Vascular;  Laterality: Right;   PERIPHERAL VASCULAR BALLOON ANGIOPLASTY  10/19/2022   Procedure: PERIPHERAL VASCULAR BALLOON ANGIOPLASTY;  Surgeon: Cephus Shelling, MD;  Location: [facility] INVASIVE CV LAB;  Service: Cardiovascular;;   PERIPHERAL VASCULAR CATHETERIZATION N/A 02/24/2016   Procedure: Abdominal Aortogram w/Lower Extremity;  Surgeon: Fransisco Hertz, MD;  Location: [facility] INVASIVE CV LAB;  Service: Cardiovascular;  Laterality: N/A;   PERIPHERAL VASCULAR CATHETERIZATION Right 02/24/2016   Procedure: Peripheral Vascular Intervention;  Surgeon: Fransisco Hertz, MD;  Location: [facility] INVASIVE CV LAB;  Service: Cardiovascular;  Laterality: Right;  Common iliac   PERIPHERAL VASCULAR CATHETERIZATION N/A 05/11/2016   Procedure: Abdominal Aortogram;  Surgeon: Fransisco Hertz, MD;  Location: [facility] INVASIVE CV LAB;  Service: Cardiovascular;  Laterality: N/A;   PERIPHERAL VASCULAR CATHETERIZATION N/A 05/11/2016   Procedure: Lower Extremity Angiography;  Surgeon: Fransisco Hertz, MD;  Location: [facility] INVASIVE CV LAB;  Service: Cardiovascular;  Laterality: N/A;   POSTERIOR CERVICAL FUSION/FORAMINOTOMY N/A 07/05/2018   Procedure: Posterior cervical fusion with lateral mass fixation - Cervical  five-Cervical six, Cervical six-Cervical seven;  Surgeon: Julio Sicks, MD;  Location: [facility] OR;  Service: Neurosurgery;  Laterality: N/A;   THROMBECTOMY FEMORAL ARTERY Right 07/17/2016   Procedure: THROMBECTOMY RIGHT FEMORAL ARTERY;  Surgeon: Fransisco Hertz, MD;  Location: [facility] OR;  Service: Vascular;  Laterality: Right;   TRANSTHORACIC ECHOCARDIOGRAM  06-20-2016   dr hilty   EF 55-60%,  grade 1 diastolic dysfunction, mild TR    Family History  Problem Relation Age of Onset   Lung cancer Father     SOCIAL HISTORY: Social History   Socioeconomic History   Marital status: Married    Spouse name: Raynelle Fanning   Number of children: Not on file   Years of education: Not on file   Highest education level: Not on file  Occupational History   Occupation: Disabled  Tobacco Use   Smoking status: Every Day    Packs/day: 1.00    Years: 42.00    Additional pack years: 0.00    Total pack years: 42.00    Types: Cigarettes, E-cigarettes   Smokeless tobacco: Never  Vaping Use   Vaping Use: Never used  Substance and Sexual Activity   Alcohol use: No    Comment: Sober since 2017   Drug use: No   Sexual activity: Yes    Partners: Female  Other Topics Concern   Not on file  Social History Narrative   Not on file   Social Determinants of Health   Financial Resource Strain: Not on file  Food Insecurity: Not on file  Transportation Needs: Not on file  Physical Activity: Not on file  Stress: Not on file  Social Connections: Not on file  Intimate Partner Violence: Not on file    Allergies  Allergen Reactions   Cymbalta [Duloxetine Hcl] Other (See Comments)    Headaches    Current Outpatient Medications  Medication Sig Dispense Refill   aspirin EC 81 MG tablet Take 1 tablet (81 mg total) by mouth daily. Swallow whole. 150 tablet 2   carvedilol (COREG) 6.25 MG tablet Take 1 tablet (6.25 mg total) by mouth 2 (two) times daily. 180 tablet 0   Cholecalciferol (VITAMIN D3) 50 MCG CAPS Take 2,000  Units by mouth daily at 12 noon.     cyclobenzaprine (FLEXERIL) 10 MG tablet Take 1 tablet (10 mg total) by mouth at bedtime as needed for muscle spasms. 90 tablet 3   doxycycline (VIBRA-TABS) 100 MG tablet Take 1 tablet (100 mg) by mouth 2 times daily. 28 tablet 0   esomeprazole (NEXIUM) 20 MG capsule Take 20 mg by mouth daily before lunch.     FLUoxetine (PROZAC) 20 MG capsule Take 1 capsule (20 mg total) by mouth every evening.     Fluticasone-Umeclidin-Vilant 100-62.5-25 MCG/ACT AEPB Inhale 1 puff into the lungs daily. (Patient taking differently: Inhale 1 puff into the lungs daily in the afternoon.) 60 each 11   phytonadione (VITAMIN K) 5 MG tablet TAKE 1/2 TABLET (2.5 MG) BY MOUTH AS DIRECTED 1 tablet 0   rosuvastatin (CRESTOR) 10 MG tablet Take 1 tablet (10 mg total) by mouth daily. (Patient taking differently: Take 10 mg by mouth daily with lunch.) 90 tablet 3   triamcinolone (NASACORT ALLERGY 24HR) 55 MCG/ACT AERO nasal inhaler Place 1-2 sprays into the nose daily.     warfarin (COUMADIN) 6 MG tablet TAKE 1 TABLET BY MOUTH DAILY EXCEPT TAKE 1/2 TABLET ON MONDAYS AND THURSDAYS OR AS DIRECTED BY ANTICOAGULATION CLINIC (Patient taking differently: Take 6 mg by mouth daily in the afternoon.) 95 tablet 1   No current facility-administered medications for this visit.    REVIEW OF SYSTEMS:  [X]  denotes positive finding, [ ]  denotes negative finding Cardiac  Comments:  Chest pain or chest pressure: ***   Shortness of breath upon exertion:    Short of breath when lying flat:    Irregular heart rhythm:        Vascular    Pain in calf, thigh, or hip brought on by ambulation:    Pain in feet at night that wakes you up from your sleep:     Blood clot in your veins:    Leg swelling:         Pulmonary    Oxygen at home:    Productive cough:     Wheezing:         Neurologic    Sudden weakness in arms or legs:     Sudden numbness in arms or legs:     Sudden onset of difficulty speaking or  slurred speech:    Temporary loss of vision in one eye:     Problems with dizziness:         Gastrointestinal    Blood in stool:     Vomited blood:         Genitourinary    Burning when urinating:  Blood in urine:        Psychiatric    Major depression:         Hematologic    Bleeding problems:    Problems with blood clotting too easily:        Skin    Rashes or ulcers:        Constitutional    Fever or chills:      PHYSICAL EXAM: There were no vitals filed for this visit.  GENERAL: The patient is a well-nourished male, in no acute distress. The vital signs are documented above. CARDIAC: There is a regular rate and rhythm.  VASCULAR: *** PULMONARY: There is good air exchange bilaterally without wheezing or rales. ABDOMEN: Soft and non-tender with normal pitched bowel sounds.  MUSCULOSKELETAL: There are no major deformities or cyanosis. NEUROLOGIC: No focal weakness or paresthesias are detected. SKIN: There are no ulcers or rashes noted. PSYCHIATRIC: The patient has a normal affect.  DATA:   ***  Assessment/Plan:  ***   Cephus Shelling, MD [facility] Office: [phone]

## 2022-11-28 ENCOUNTER — Ambulatory Visit (INDEPENDENT_AMBULATORY_CARE_PROVIDER_SITE_OTHER): Payer: Commercial Managed Care - PPO | Admitting: Vascular Surgery

## 2022-11-28 ENCOUNTER — Ambulatory Visit (HOSPITAL_COMMUNITY)
Admission: RE | Admit: 2022-11-28 | Discharge: 2022-11-28 | Disposition: A | Discharge status: Home / Self Care | Payer: Commercial Managed Care - PPO | Source: Ambulatory Visit | Attending: Vascular Surgery | Admitting: Vascular Surgery

## 2022-11-28 ENCOUNTER — Ambulatory Visit (INDEPENDENT_AMBULATORY_CARE_PROVIDER_SITE_OTHER)
Admission: RE | Admit: 2022-11-28 | Discharge: 2022-11-28 | Disposition: A | Discharge status: Home / Self Care | Payer: Commercial Managed Care - PPO | Source: Ambulatory Visit | Attending: Vascular Surgery | Admitting: Vascular Surgery

## 2022-11-28 ENCOUNTER — Other Ambulatory Visit (HOSPITAL_COMMUNITY): Payer: Self-pay | Admitting: Vascular Surgery

## 2022-11-28 ENCOUNTER — Encounter: Payer: Self-pay | Admitting: Vascular Surgery

## 2022-11-28 VITALS — BP 118/77 | HR 72 | Temp 97.7°F | Resp 18 | Ht 69.0 in | Wt 169.0 lb

## 2022-11-28 DIAGNOSIS — I779 Disorder of arteries and arterioles, unspecified: Secondary | ICD-10-CM | POA: Diagnosis not present

## 2022-11-28 DIAGNOSIS — I739 Peripheral vascular disease, unspecified: Secondary | ICD-10-CM

## 2022-11-28 DIAGNOSIS — M79605 Pain in left leg: Secondary | ICD-10-CM | POA: Diagnosis not present

## 2022-11-28 LAB — VAS US ABI WITH/WO TBI
Left ABI: 0.91
Right ABI: 1.13

## 2022-11-29 ENCOUNTER — Ambulatory Visit (INDEPENDENT_AMBULATORY_CARE_PROVIDER_SITE_OTHER): Payer: Commercial Managed Care - PPO

## 2022-11-29 DIAGNOSIS — Z7901 Long term (current) use of anticoagulants: Secondary | ICD-10-CM | POA: Diagnosis not present

## 2022-11-29 DIAGNOSIS — D6861 Antiphospholipid syndrome: Secondary | ICD-10-CM | POA: Diagnosis not present

## 2022-11-29 LAB — POCT INR: INR: 1.9 — AB (ref 2.0–3.0)

## 2022-11-29 NOTE — Patient Instructions (Addendum)
Pre visit review using our clinic review tool, if applicable. No additional management support is needed unless otherwise documented below in the visit note.  Increase dose today to take 1 1/2 tablets and then change weekly dose to take 1 tablet daily except take 1 1/2 tablets on Mondays and Thursdays. Recheck in 2 week.

## 2022-11-29 NOTE — Progress Notes (Signed)
Pt tests at home. Result is placed on [employer] website by pt. INR today is 1.9 Increase dose today to take 1 1/2 tablets and then change weekly dose to take 1 tablet daily except take 1 1/2 tablets on Mondays and Thursdays. Recheck in 2 week.  Sent pt a Radio broadcast assistant with dosing instructions.

## 2022-12-12 ENCOUNTER — Other Ambulatory Visit: Payer: Self-pay

## 2022-12-12 DIAGNOSIS — I779 Disorder of arteries and arterioles, unspecified: Secondary | ICD-10-CM

## 2022-12-12 DIAGNOSIS — Z95828 Presence of other vascular implants and grafts: Secondary | ICD-10-CM

## 2022-12-13 ENCOUNTER — Ambulatory Visit (INDEPENDENT_AMBULATORY_CARE_PROVIDER_SITE_OTHER): Payer: Commercial Managed Care - PPO

## 2022-12-13 DIAGNOSIS — Z7901 Long term (current) use of anticoagulants: Secondary | ICD-10-CM | POA: Diagnosis not present

## 2022-12-13 LAB — POCT INR: INR: 3.4 — AB (ref 2.0–3.0)

## 2022-12-13 NOTE — Patient Instructions (Addendum)
Pre visit review using our clinic review tool, if applicable. No additional management support is needed unless otherwise documented below in the visit note.  Hold dose today and then change weekly dose to take 1 tablet daily except take 1 1/2 tablets on Thursdays. Recheck in 2 week.

## 2022-12-13 NOTE — Progress Notes (Signed)
I have reviewed and agree with note, evaluation, plan.   Stephen Hunter, MD  

## 2022-12-13 NOTE — Progress Notes (Signed)
Pt tests at home. Result is placed on [employer] website by pt. INR today is 3.4 Hold dose today and then change weekly dose to take 1 tablet daily except take 1 1/2 tablets on Thursdays. Recheck in 2 week.  Sent pt a Radio broadcast assistant with dosing instructions.

## 2022-12-14 DIAGNOSIS — M79605 Pain in left leg: Secondary | ICD-10-CM | POA: Diagnosis not present

## 2022-12-24 ENCOUNTER — Other Ambulatory Visit: Payer: Self-pay | Admitting: Cardiology

## 2022-12-24 ENCOUNTER — Other Ambulatory Visit: Payer: Self-pay | Admitting: Family Medicine

## 2022-12-24 DIAGNOSIS — D6859 Other primary thrombophilia: Secondary | ICD-10-CM

## 2022-12-25 ENCOUNTER — Other Ambulatory Visit (HOSPITAL_COMMUNITY): Payer: Self-pay

## 2022-12-25 ENCOUNTER — Other Ambulatory Visit: Payer: Self-pay

## 2022-12-25 MED ORDER — WARFARIN SODIUM 6 MG PO TABS
ORAL_TABLET | ORAL | 1 refills | Status: DC
Start: 2022-12-25 — End: 2023-07-07
  Filled 2022-12-25: qty 95, 90d supply, fill #0
  Filled 2023-04-08: qty 95, 90d supply, fill #1

## 2022-12-25 MED ORDER — CARVEDILOL 6.25 MG PO TABS
6.2500 mg | ORAL_TABLET | Freq: Two times a day (BID) | ORAL | 0 refills | Status: DC
  Filled 2022-12-25: qty 180, 90d supply, fill #0

## 2022-12-27 ENCOUNTER — Ambulatory Visit (INDEPENDENT_AMBULATORY_CARE_PROVIDER_SITE_OTHER): Payer: Commercial Managed Care - PPO

## 2022-12-27 DIAGNOSIS — Z7901 Long term (current) use of anticoagulants: Secondary | ICD-10-CM

## 2022-12-27 LAB — POCT INR: INR: 2.7 (ref 2.0–3.0)

## 2022-12-27 NOTE — Progress Notes (Signed)
Pt tests at home. Result is placed on [employer] website by pt. INR today is 2.7 Continue 1 tablet daily except take 1 1/2 tablets on Thursdays. Recheck in 2 week, on 8/21. Sent pt a Radio broadcast assistant with dosing instructions.

## 2022-12-27 NOTE — Patient Instructions (Addendum)
Pre visit review using our clinic review tool, if applicable. No additional management support is needed unless otherwise documented below in the visit note.  Continue 1 tablet daily except take 1 1/2 tablets on Thursdays. Recheck in 2 week, on 8/21.

## 2023-01-01 ENCOUNTER — Other Ambulatory Visit (HOSPITAL_COMMUNITY): Payer: Self-pay

## 2023-01-01 ENCOUNTER — Other Ambulatory Visit: Payer: Self-pay | Admitting: Family Medicine

## 2023-01-01 DIAGNOSIS — F339 Major depressive disorder, recurrent, unspecified: Secondary | ICD-10-CM

## 2023-01-01 NOTE — Telephone Encounter (Signed)
LOV: 11/10/2022  Last Fil Date: 11/10/2022  Qty: 90  Refills 3

## 2023-01-02 ENCOUNTER — Other Ambulatory Visit: Payer: Self-pay | Admitting: Family Medicine

## 2023-01-02 DIAGNOSIS — F339 Major depressive disorder, recurrent, unspecified: Secondary | ICD-10-CM

## 2023-01-03 ENCOUNTER — Other Ambulatory Visit (HOSPITAL_COMMUNITY): Payer: Self-pay

## 2023-01-03 ENCOUNTER — Other Ambulatory Visit: Payer: Self-pay

## 2023-01-03 ENCOUNTER — Encounter: Payer: Self-pay | Admitting: Family Medicine

## 2023-01-03 MED ORDER — FLUOXETINE HCL 20 MG PO CAPS
20.0000 mg | ORAL_CAPSULE | Freq: Every day | ORAL | 3 refills | Status: DC
Start: 2023-01-03 — End: 2023-12-31
  Filled 2023-01-03: qty 90, 90d supply, fill #0
  Filled 2023-04-01: qty 90, 90d supply, fill #1
  Filled 2023-07-07 – 2023-07-09 (×2): qty 90, 90d supply, fill #2
  Filled 2023-09-30: qty 90, 90d supply, fill #3

## 2023-01-03 MED ORDER — FLUOXETINE HCL 20 MG PO CAPS
20.0000 mg | ORAL_CAPSULE | Freq: Every evening | ORAL | Status: DC
Start: 2023-01-03 — End: 2023-01-03

## 2023-01-06 ENCOUNTER — Other Ambulatory Visit (HOSPITAL_COMMUNITY): Payer: Self-pay

## 2023-01-10 ENCOUNTER — Ambulatory Visit (INDEPENDENT_AMBULATORY_CARE_PROVIDER_SITE_OTHER): Payer: Commercial Managed Care - PPO

## 2023-01-10 DIAGNOSIS — Z7901 Long term (current) use of anticoagulants: Secondary | ICD-10-CM | POA: Diagnosis not present

## 2023-01-10 LAB — POCT INR: INR: 2.8 (ref 2.0–3.0)

## 2023-01-10 NOTE — Patient Instructions (Addendum)
Pre visit review using our clinic review tool, if applicable. No additional management support is needed unless otherwise documented below in the visit note.  Continue 1 tablet daily except take 1 1/2 tablets on Thursdays. Recheck in 3 week, on 9/11,

## 2023-01-10 NOTE — Progress Notes (Signed)
Pt tests at home. Result is placed on [employer] website by pt. INR today is 2.8 Continue 1 tablet daily except take 1 1/2 tablets on Thursdays. Recheck in 3 week, on 9/11, due to coumadin nurse not in the office the week of 9/2. Sent pt a Radio broadcast assistant with dosing instructions.

## 2023-01-14 ENCOUNTER — Other Ambulatory Visit (HOSPITAL_COMMUNITY): Payer: Self-pay

## 2023-01-31 ENCOUNTER — Ambulatory Visit (INDEPENDENT_AMBULATORY_CARE_PROVIDER_SITE_OTHER): Payer: Commercial Managed Care - PPO

## 2023-01-31 DIAGNOSIS — Z7901 Long term (current) use of anticoagulants: Secondary | ICD-10-CM | POA: Diagnosis not present

## 2023-01-31 DIAGNOSIS — D6861 Antiphospholipid syndrome: Secondary | ICD-10-CM | POA: Diagnosis not present

## 2023-01-31 LAB — POCT INR: INR: 2.9 (ref 2.0–3.0)

## 2023-01-31 NOTE — Patient Instructions (Addendum)
Pre visit review using our clinic review tool, if applicable. No additional management support is needed unless otherwise documented below in the visit note.  Continue 1 tablet daily except take 1 1/2 tablets on Thursdays. Recheck in 2 week, on 9/25.

## 2023-01-31 NOTE — Progress Notes (Signed)
I have reviewed and agree with note, evaluation, plan.   Stephen Hunter, MD  

## 2023-01-31 NOTE — Progress Notes (Signed)
Pt tests at home. Result is placed on [employer] website by pt. INR today is 2.9 Continue 1 tablet daily except take 1 1/2 tablets on Thursdays. Recheck in 2 week, on 9/25. Sent pt a Radio broadcast assistant with dosing instructions.

## 2023-02-12 ENCOUNTER — Other Ambulatory Visit: Payer: Self-pay

## 2023-02-12 ENCOUNTER — Other Ambulatory Visit (HOSPITAL_COMMUNITY): Payer: Self-pay

## 2023-02-12 ENCOUNTER — Telehealth: Payer: Self-pay | Admitting: Family Medicine

## 2023-02-12 DIAGNOSIS — M5416 Radiculopathy, lumbar region: Secondary | ICD-10-CM

## 2023-02-12 MED ORDER — PREDNISONE 20 MG PO TABS
20.0000 mg | ORAL_TABLET | Freq: Every day | ORAL | 0 refills | Status: DC
  Filled 2023-02-12: qty 5, 5d supply, fill #0

## 2023-02-12 NOTE — Telephone Encounter (Signed)
Order placed. Patient notified.

## 2023-02-12 NOTE — Telephone Encounter (Signed)
Patient states that his lower back has been "out" for several days. Patient has tried icing daily with minimal relief. He is overdue for an epidural but requesting a prescription for steroids if possible.  He does have a follow up with Dr Katrinka Blazing on 10/9 and asked if an order for an epidural could go ahead and and be sent in as well?  If he is able to do the steroids, how long would he need to wait before scheduling the epidural?  Please advise.

## 2023-02-14 ENCOUNTER — Ambulatory Visit (INDEPENDENT_AMBULATORY_CARE_PROVIDER_SITE_OTHER): Payer: Commercial Managed Care - PPO

## 2023-02-14 DIAGNOSIS — Z7901 Long term (current) use of anticoagulants: Secondary | ICD-10-CM

## 2023-02-14 LAB — POCT INR: INR: 2.6 (ref 2.0–3.0)

## 2023-02-14 NOTE — Progress Notes (Signed)
Pt tests at home. Result is placed on [employer] website by pt. INR today is 2.6 Continue 1 tablet daily except take 1 1/2 tablets on Thursdays. Recheck in 2 week, on 10/9. Pt will have another epidural scheduled in the near future. Not scheduled currently. Sent pt a Radio broadcast assistant with dosing instructions.

## 2023-02-14 NOTE — Patient Instructions (Addendum)
Pre visit review using our clinic review tool, if applicable. No additional management support is needed unless otherwise documented below in the visit note.  Continue 1 tablet daily except take 1 1/2 tablets on Thursdays. Recheck in 2 week, on 10/9.

## 2023-02-14 NOTE — Progress Notes (Signed)
I have reviewed the patient's encounter and agree with the documentation.  Katina Degree. Jimmey Ralph, MD 02/14/2023 8:43 AM

## 2023-02-19 NOTE — Telephone Encounter (Signed)
Procedure: Lumbar Epidural Scheduled: 03/16/23 Lovenox bridge will be needed  Pt Wt: 77 kg CrCl: 95.69 mL/min Current warfarin dose; Take 1 tablet (6 mg) daily excep take 1 1/2 tablets (9 mg) on Thursdays. Lovenox dosage recommended: 120 mg, every day, 9 syringes needed  10/20: Take last dose of warfarin 10/21: NO warfarin, NO Lovenox 10/22: NO warfarin, Lovenox AM only 10/23: NO warfarin, Lovenox AM only 10/24: NO warfarin, Lovenox AM only(BEFORE 7 AM)  10/25: PROCEDURE; NO WARFARIN, NO LOVENOX  10/26: Take 1 1/2 tablets (9 mg) warfarin, Lovenox AM only 10/27: Take 1 1/2 tablets (9 mg) warfarin, Lovenox AM only 10/28: Take 1 1/2 tablets (9 mg) warfarin, Lovenox AM only 10/29: Take 1 1/2 tablets (9 mg) warfarin, Lovenox AM only 10/30: Take 1 tablet (6 mg) warfarin, Lovenox AM only 10/31: Take 1 1/2 tablets (9 mg) warfarin, Lovenox AM only 11/1: Recheck INR; NO warfarin, NO Lovenox until after INR check.

## 2023-02-20 ENCOUNTER — Other Ambulatory Visit: Payer: Self-pay

## 2023-02-20 ENCOUNTER — Other Ambulatory Visit (HOSPITAL_COMMUNITY): Payer: Self-pay

## 2023-02-20 MED ORDER — ENOXAPARIN SODIUM 120 MG/0.8ML IJ SOSY
120.0000 mg | PREFILLED_SYRINGE | INTRAMUSCULAR | 0 refills | Status: DC
Start: 2023-02-20 — End: 2023-05-11
  Filled 2023-02-20: qty 7.2, 9d supply, fill #0

## 2023-02-20 NOTE — Telephone Encounter (Signed)
Sent in Lovenox prescription.

## 2023-02-20 NOTE — Addendum Note (Signed)
Addended by: Sherrie George A on: 02/20/2023 01:02 PM   Modules accepted: Orders

## 2023-02-23 NOTE — Telephone Encounter (Addendum)
Update from pt's wife, [name], that procedure was moved up to 10/14.  Updated Lovenox bridge:  10/9: Take last dose of warfarin 10/10: NO warfarin, NO Lovenox 10/11: NO warfarin, Lovenox AM only 10/12: NO warfarin, Lovenox AM only 10/13: NO warfarin, Lovenox AM only (BEFORE 7 AM)  10/14: PROCEDURE; NO WARFARIN, NO LOVENOX  10/15: Take 1 1/2 tablets (9 mg) warfarin, Lovenox AM only 10/16: Take 1 1/2 tablets (9 mg) warfarin, Lovenox AM only 10/17: Take 2 tablets (12 mg) warfarin, Lovenox AM only 10/18: Take 1 1/2 tablets (9 mg) warfarin, Lovenox AM only 10/19: Take 1 tablet (6 mg) warfarin, Lovenox AM only 10/20: Take 1 tablet (6 mg) warfarin, Lovenox AM only  10/21: Recheck INR; NO warfarin, NO Lovenox until after INR check

## 2023-02-27 NOTE — Progress Notes (Unsigned)
Electrophysiology Office Note:   Date:  02/28/2023  ID:  [name], DOB 08-Jul-1965, MRN [id number]  Primary Cardiologist: None Electrophysiologist: Saima Monterroso Jorja Loa, MD      History of Present Illness:   [name] is a 57 y.o. male with h/o antiphospholipid antibody syndrome, chronic systolic heart failure with nonobstructive coronary artery disease, PVCs seen today for routine electrophysiology followup.   Since last being seen in our clinic the patient reports doing.  He continues to have PVCs, though he is unaware.  He is able to do his daily activities.  He is not having fatigue, shortness of breath, palpitations.  On his most recent echo, his ejection fraction was normal.  he denies chest pain, palpitations, dyspnea, PND, orthopnea, nausea, vomiting, dizziness, syncope, edema, weight gain, or early satiety.   Review of systems complete and found to be negative unless listed in HPI.   EP Information / Studies Reviewed:    EKG is ordered today. Personal review as below.  EKG Interpretation Date/Time:  Wednesday February 28 2023 10:03:22 EDT Ventricular Rate:  60 PR Interval:  124 QRS Duration:  86 QT Interval:  394 QTC Calculation: 394 R Axis:   61  Text Interpretation: Sinus rhythm with frequent Premature ventricular complexes When compared with ECG of 19-Oct-2022 07:49, No significant change was found Confirmed by Gurpreet Mikhail ([id number]) on 02/28/2023 10:11:37 AM     Risk Assessment/Calculations:              Physical Exam:   VS:  BP 122/72   Pulse 60   Ht 5\' 9"  (1.753 m)   Wt 182 lb 3.2 oz (82.6 kg)   SpO2 98%   BMI 26.91 kg/m    Wt Readings from Last 3 Encounters:  02/28/23 182 lb 3.2 oz (82.6 kg)  02/28/23 182 lb (82.6 kg)  11/28/22 169 lb (76.7 kg)     GEN: Well nourished, well developed in no acute distress NECK: No JVD; No carotid bruits CARDIAC: Regular rate and rhythm with occasional ectopy, no murmurs, rubs, gallops RESPIRATORY:  Clear to  auscultation without rales, wheezing or rhonchi  ABDOMEN: Soft, non-tender, non-distended EXTREMITIES:  No edema; No deformity   ASSESSMENT AND PLAN:    1.  PVCs: Has been taken off of mexiletine due to side effects.  Repeat echo showed a normal ejection fraction.  He is unaware of PVCs.  He is having PVCs via auscultation and on EKG today.  As he is feeling well, we Luiza Carranco continue monitoring.  We did discuss ablation versus alternative medications if PVCs continue an ejection fraction is reduced again.  2.  Chronic systolic heart failure: Due to nonischemic cardiomyopathy.  Ejection fraction is normalized with suppression of PVCs.  He is no longer on mexiletine as he was feeling poorly on this medication.  Jabar Krysiak see him back in 6 months with repeat echo prior to the visit.  Follow up with Dr. Elberta Fortis in 6 months  Signed, Jonmichael Beadnell Jorja Loa, MD

## 2023-02-27 NOTE — Progress Notes (Unsigned)
[name] [address] [place] [address] Phone: [phone] Subjective:   [name], am serving as a scribe for Dr. Antoine Primas.  I'm seeing this patient by the request  of:  Willow Ora, MD  CC: left lower leg   [id number]  11/13/2022 Patient has worsening hematoma again.  Patient is on Coumadin.  We discussed with patient that we may need to consider INR being close to for some time.  We discussed potential compression.  Did have some erythema surrounding the area so we will cover for any type of possible infectious etiology.  Blood was aspirated did not show any type of infection and seem to be just venous blood.  Patient continued to have difficulty walking secondary to posterior pain and could have had a muscle injury secondary to the tightness patient was having.  We discussed with patient though that worsening symptoms to seek medical attention.  X-rays ordered today for tib-fib views to make sure there is no sign of any bone involvement.  Patient's is going to follow-up with me at the end of the week to see if any significant reaccumulation happens again.  If it does happen too quickly will need to consider the possibility of referral to orthopedic surgery for a possible drain placement.      Update 02/28/2023 [name] is a 57 y.o. male coming in with complaint of hematoma of L lower leg. Patient states his leg is doing well.   Pain in spine vs radiating down his legs. Use to radiate down the R leg.       Past Medical History:  Diagnosis Date   Anticoagulated on Coumadin    Antiphospholipid antibody positive, followed by Dr. Candise Che, Hematology 06/27/2016   Anxiety with depression 06/24/2014   Chronic back pain    ED (erectile dysfunction)    GERD (gastroesophageal reflux disease)    Hemorrhoids, internal, with bleeding    Hiatal hernia    History of adenomatous polyp of colon, 07/08/16, tubular adenomas    History of  atrial fibrillation, episode 08/2016    History of cardiomyopathy (HCC) 08/04/2018   ECHO 12/02/18  1. The left ventricle has normal systolic function with an ejection fraction of 60-65%. The cavity size was normal. Left ventricular diastolic parameters were normal.  2. The right ventricle has normal systolic function. The cavity was normal. There is no increase in right ventricular wall thickness.  3. The mitral valve is grossly normal.  4. The aortic valve is tricuspid. Mild thic   History of DVT of lower extremity    Hyperlipidemia    Iron deficiency anemia due to chronic blood loss, thought to be from hemorrhoids while on coumadin, s/p iron infusion and blood transfusion x 2, 2018 2018   Low testosterone in male 05/04/2017   2018, 2019: Decision would be risk vs benefit. Could increase thrombotic risk especially if he has a polycythemic response.  If he decided to pursue this with an understanding of the risk - would use lowest dose of testosterone to maintain level low normal 300-400.  2020: Secondary polycythemia. Not a candidate.   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    Nicotine dependence    OA (osteoarthritis)    PAD (peripheral artery disease) (HCC), followed by Dr Imogene Burn    hx right CIA angioplasty and stent 02-24-2016/right CIA thromectomy and patch angioplasty for restenosis 07-18-2015   PVCs (premature ventricular contractions)    Recovering alcoholic in  remission (HCC), sober since 04/2016    S/P insertion of iliac artery stent 2018   02-23-2017  right CIA PTA and stent/05-11-2016 in-stent restenosis s/p thrombectomy/2018 thrombectomy and  patch angioplasty right femoral artery    Past Surgical History:  Procedure Laterality Date   ABDOMINAL AORTOGRAM W/LOWER EXTREMITY N/A 10/19/2022   Procedure: ABDOMINAL AORTOGRAM W/LOWER EXTREMITY;  Surgeon: Cephus Shelling, MD;  Location: [facility] INVASIVE CV LAB;  Service: Cardiovascular;  Laterality: N/A;   ANGIOPLASTY ILLIAC ARTERY  Right 07/17/2016   Procedure: ANGIOPLASTY RIGHT COMMON ILIAC ARTERY;  Surgeon: Fransisco Hertz, MD;  Location: [facility] OR;  Service: Vascular;  Laterality: Right;   ANTERIOR CERVICAL DECOMP/DISCECTOMY FUSION  07/21/2010   C5 -- C7   CARDIOVASCULAR STRESS TEST  06-14-2016   dr hilty   normal perfusion study w/ no reversible ischemia/  stress ef 44% but visually looks better , echo ordered (LVEF 30-44%)  , normal LV wall motion    COLONOSCOPY N/A 07/08/2016   Procedure: COLONOSCOPY;  Surgeon: Vida Rigger, MD;  Location: [facility] ENDOSCOPY;  Service: Endoscopy;  Laterality: N/A;   ESOPHAGOGASTRODUODENOSCOPY N/A 07/08/2016   Procedure: ESOPHAGOGASTRODUODENOSCOPY (EGD);  Surgeon: Vida Rigger, MD;  Location: [facility];  Service: Endoscopy;  Laterality: N/A;   GROIN DEBRIDEMENT Right 07/25/2016   Procedure: EVACUATION HEMATOMA RIGHT GROIN;  Surgeon: Maeola Harman, MD;  Location: [facility] OR;  Service: Vascular;  Laterality: Right;   HEMORRHOID SURGERY N/A 12/07/2016   Procedure: 3 COLUMN HEMORRHOIDECTOMY;  Surgeon: Romie Levee, MD;  Location: [facility];  Service: General;  Laterality: N/A;   INTRAOPERATIVE ARTERIOGRAM Right 07/17/2016   Procedure: INTRA OPERATIVE ANGIOGRAM OF RIGHT COMMON ILIAC ARTERY;  Surgeon: Fransisco Hertz, MD;  Location: [facility] OR;  Service: Vascular;  Laterality: Right;   KNEE SURGERY Right 1983   cartilage   PATCH ANGIOPLASTY Right 07/17/2016   Procedure: PATCH ANGIOPLASTY RIGHT FEMORAL ARTERY USING Livia Snellen BIOLOGIC PATCH;  Surgeon: Fransisco Hertz, MD;  Location: [facility] OR;  Service: Vascular;  Laterality: Right;   PERIPHERAL VASCULAR BALLOON ANGIOPLASTY  10/19/2022   Procedure: PERIPHERAL VASCULAR BALLOON ANGIOPLASTY;  Surgeon: Cephus Shelling, MD;  Location: [facility] INVASIVE CV LAB;  Service: Cardiovascular;;   PERIPHERAL VASCULAR CATHETERIZATION N/A 02/24/2016   Procedure: Abdominal Aortogram w/Lower Extremity;  Surgeon: Fransisco Hertz, MD;  Location: [facility] INVASIVE CV LAB;  Service:  Cardiovascular;  Laterality: N/A;   PERIPHERAL VASCULAR CATHETERIZATION Right 02/24/2016   Procedure: Peripheral Vascular Intervention;  Surgeon: Fransisco Hertz, MD;  Location: [facility] INVASIVE CV LAB;  Service: Cardiovascular;  Laterality: Right;  Common iliac   PERIPHERAL VASCULAR CATHETERIZATION N/A 05/11/2016   Procedure: Abdominal Aortogram;  Surgeon: Fransisco Hertz, MD;  Location: [facility] INVAS[facility] CV LAB;  Service: Cardiovascular;  Laterality: N/A;   PERIPHERAL VASCULAR CATHETERIZATION N/A 05/11/2016   Procedure: Lower Extremity Angiography;  Surgeon: Fransisco Hertz, MD;  Location: [facility] INVASIVE CV LAB;  Service: Cardiovascular;  Laterality: N/A;   POSTERIOR CERVICAL FUSION/FORAMINOTOMY N/A 07/05/2018   Procedure: Posterior cervical fusion with lateral mass fixation - Cervical five-Cervical six, Cervical six-Cervical seven;  Surgeon: Julio Sicks, MD;  Location: [facility] OR;  Service: Neurosurgery;  Laterality: N/A;   THROMBECTOMY FEMORAL ARTERY Right 07/17/2016   Procedure: THROMBECTOMY RIGHT FEMORAL ARTERY;  Surgeon: Fransisco Hertz, MD;  Location: [facility] OR;  Service: Vascular;  Laterality: Right;   TRANSTHORACIC ECHOCARDIOGRAM  06-20-2016   dr hilty   EF 55-60%,  grade 1 diastolic dysfunction, mild TR   Social History  Socioeconomic History   Marital status: Married    Spouse name: Raynelle Fanning   Number of children: Not on file   Years of education: Not on file   Highest education level: Not on file  Occupational History   Occupation: Disabled  Tobacco Use   Smoking status: Every Day    Current packs/day: 1.00    Average packs/day: 1 pack/day for 42.0 years (42.0 ttl pk-yrs)    Types: Cigarettes, E-cigarettes   Smokeless tobacco: Never  Vaping Use   Vaping status: Never Used  Substance and Sexual Activity   Alcohol use: No    Comment: Sober since 2017   Drug use: No   Sexual activity: Yes    Partners: Female  Other Topics Concern   Not on file  Social History Narrative   Not on file   Social Determinants  of Health   Financial Resource Strain: Not on file  Food Insecurity: Not on file  Transportation Needs: Not on file  Physical Activity: Not on file  Stress: Not on file  Social Connections: Not on file   Allergies  Allergen Reactions   Cymbalta [Duloxetine Hcl] Other (See Comments)    Headaches   Family History  Problem Relation Age of Onset   Lung cancer Father     Current Outpatient Medications (Endocrine & Metabolic):    predniSONE (DELTASONE) 20 MG tablet, Take 1 tablet (20 mg total) by mouth daily with breakfast.  Current Outpatient Medications (Cardiovascular):    carvedilol (COREG) 6.25 MG tablet, Take 1 tablet (6.25 mg total) by mouth 2 (two) times daily.   rosuvastatin (CRESTOR) 10 MG tablet, Take 1 tablet (10 mg total) by mouth daily. (Patient taking differently: Take 10 mg by mouth daily with lunch.)  Current Outpatient Medications (Respiratory):    Fluticasone-Umeclidin-Vilant 100-62.5-25 MCG/ACT AEPB, Inhale 1 puff into the lungs daily. (Patient taking differently: Inhale 1 puff into the lungs daily in the afternoon.)   triamcinolone (NASACORT ALLERGY 24HR) 55 MCG/ACT AERO nasal inhaler, Place 1-2 sprays into the nose daily.  Current Outpatient Medications (Analgesics):    aspirin EC 81 MG tablet, Take 1 tablet (81 mg total) by mouth daily. Swallow whole.  Current Outpatient Medications (Hematological):    enoxaparin (LOVENOX) 120 MG/0.8ML injection, Inject 0.8 mLs (120 mg total) into the skin daily. AS INSTRUCTED BY ANTICOAGULATION CLINIC   warfarin (COUMADIN) 6 MG tablet, TAKE 1 TABLET BY MOUTH DAILY EXCEPT TAKE 1/2 TABLET ON MONDAYS AND THURSDAYS OR AS DIRECTED BY ANTICOAGULATION CLINIC  Current Outpatient Medications (Other):    Cholecalciferol (VITAMIN D3) 50 MCG CAPS, Take 2,000 Units by mouth daily at 12 noon.   cyclobenzaprine (FLEXERIL) 10 MG tablet, Take 1 tablet (10 mg total) by mouth at bedtime as needed for muscle spasms.   doxycycline (VIBRA-TABS)  100 MG tablet, Take 1 tablet (100 mg) by mouth 2 times daily.   esomeprazole (NEXIUM) 20 MG capsule, Take 20 mg by mouth daily before lunch.   FLUoxetine (PROZAC) 20 MG capsule, Take 1 capsule (20 mg total) by mouth daily.   phytonadione (VITAMIN K) 5 MG tablet, TAKE 1/2 TABLET (2.5 MG) BY MOUTH AS DIRECTED   Reviewed prior external information including notes and imaging from  primary care provider As well as notes that were available from care everywhere and other healthcare systems.  Past medical history, social, surgical and family history all reviewed in electronic medical record.  No pertanent information unless stated regarding to the chief complaint.   Review of Systems:  No headache, visual changes, nausea, vomiting, diarrhea, constipation, dizziness, abdominal pain, skin rash, fevers, chills, night sweats, weight loss, swollen lymph nodes, body aches, joint swelling, chest pain, shortness of breath, mood changes. POSITIVE muscle aches  Objective  Blood pressure 118/72, pulse 70, height 5\' 9"  (1.753 m), weight 182 lb (82.6 kg), SpO2 97%.   General: No apparent distress alert and oriented x3 mood and affect normal, dressed appropriately.  HEENT: Pupils equal, extraocular movements intact  Respiratory: Patient's speak in full sentences and does not appear short of breath  Cardiovascular: No lower extremity edema, non tender, no erythema  Left leg exam seems to be well-healed at this time.  No significant swelling noted. Low back though does have increasing discomfort noted.  Still tighter on the right side of the back with patient actually having more sidebending to the right.  Patient also has though tenderness noted in the midline.  No worsening pain with extension of the back noted.  Strength of the lower extremities 4+ out of 5 but symmetric. Patient does seem to be uncomfortable with walking.   Impression and Recommendations:     The above documentation has been reviewed and  is accurate and complete Judi Saa, DO

## 2023-02-28 ENCOUNTER — Ambulatory Visit: Payer: Commercial Managed Care - PPO | Admitting: Family Medicine

## 2023-02-28 ENCOUNTER — Encounter: Payer: Self-pay | Admitting: Cardiology

## 2023-02-28 ENCOUNTER — Encounter: Payer: Self-pay | Admitting: Family Medicine

## 2023-02-28 ENCOUNTER — Ambulatory Visit: Payer: Commercial Managed Care - PPO | Attending: Cardiology | Admitting: Cardiology

## 2023-02-28 ENCOUNTER — Ambulatory Visit (INDEPENDENT_AMBULATORY_CARE_PROVIDER_SITE_OTHER): Payer: Commercial Managed Care - PPO

## 2023-02-28 VITALS — BP 118/72 | HR 70 | Ht 69.0 in | Wt 182.0 lb

## 2023-02-28 VITALS — BP 122/72 | HR 60 | Ht 69.0 in | Wt 182.2 lb

## 2023-02-28 DIAGNOSIS — M545 Low back pain, unspecified: Secondary | ICD-10-CM | POA: Diagnosis not present

## 2023-02-28 DIAGNOSIS — G8929 Other chronic pain: Secondary | ICD-10-CM | POA: Diagnosis not present

## 2023-02-28 DIAGNOSIS — I5022 Chronic systolic (congestive) heart failure: Secondary | ICD-10-CM | POA: Diagnosis not present

## 2023-02-28 DIAGNOSIS — M5441 Lumbago with sciatica, right side: Secondary | ICD-10-CM

## 2023-02-28 DIAGNOSIS — I493 Ventricular premature depolarization: Secondary | ICD-10-CM | POA: Diagnosis not present

## 2023-02-28 DIAGNOSIS — M47816 Spondylosis without myelopathy or radiculopathy, lumbar region: Secondary | ICD-10-CM | POA: Diagnosis not present

## 2023-02-28 DIAGNOSIS — M48061 Spinal stenosis, lumbar region without neurogenic claudication: Secondary | ICD-10-CM | POA: Diagnosis not present

## 2023-02-28 DIAGNOSIS — M5136 Other intervertebral disc degeneration, lumbar region with discogenic back pain only: Secondary | ICD-10-CM | POA: Diagnosis not present

## 2023-02-28 NOTE — Patient Instructions (Signed)
Medication Instructions:  Your physician recommends that you continue on your current medications as directed. Please refer to the Current Medication list given to you today.  *If you need a refill on your cardiac medications before your next appointment, please call your pharmacy*   Lab Work: None ordered If you have labs (blood work) drawn today and your tests are completely normal, you will receive your results only by: MyChart Message (if you have MyChart) OR A paper copy in the mail If you have any lab test that is abnormal or we need to change your treatment, we will call you to review the results.   Testing/Procedures: Your physician has requested that you have an echocardiogram - in 6 months, prior to your follow up with Dr. Elberta Fortis.. Echocardiography is a painless test that uses sound waves to create images of your heart. It provides your doctor with information about the size and shape of your heart and how well your heart's chambers and valves are working. This procedure takes approximately one hour. There are no restrictions for this procedure. Please do NOT wear cologne, perfume, aftershave, or lotions (deodorant is allowed). Please arrive 15 minutes prior to your appointment time.   Follow-Up: At [facility], you and your health needs are our priority.  As part of our continuing mission to provide you with exceptional heart care, we have created designated Provider Care Teams.  These Care Teams include your primary Cardiologist (physician) and Advanced Practice Providers (APPs -  Physician Assistants and Nurse Practitioners) who all work together to provide you with the care you need, when you need it.  Your next appointment:   6 month(s)  The format for your next appointment:   In Person  Provider:   Loman Brooklyn, MD    Thank you for choosing [facility]!!   Dory Horn, RN [phone]

## 2023-02-28 NOTE — Assessment & Plan Note (Signed)
Do feel that still the epidural would be beneficial but we will move it to L5-S1.  Do feel that this would be an area where we do see some significant progression compared to the 2017 x-rays of the 2020 MRI.  Patient does also have what appears to be a fairly large sacral cyst that has potentially increase in size as well.  Past medical history is significant for antiphospholipid antibody, polycythemia as well as a smoker so if continuing to have discomfort and midline tenderness do feel that further evaluation with advanced imaging would be warranted.  Patient does have a very mild anterior change of the sacrum which would be concerning for potential stress fracture but highly unlikely.  Will see how patient responds with the injection if no significant improvement I do believe with patient's MRIs being greater than 55 years old and symptoms changed we should consider the possibility of advanced imaging.

## 2023-02-28 NOTE — Patient Instructions (Addendum)
L5-S1

## 2023-03-02 NOTE — Discharge Instructions (Signed)
Post Procedure Spinal Discharge Instruction Sheet  You may resume a regular diet and any medications that you routinely take (including pain medications) unless otherwise noted by MD.  No driving day of procedure.  Light activity throughout the rest of the day.  Do not do any strenuous work, exercise, bending or lifting.  The day following the procedure, you can resume normal physical activity but you should refrain from exercising or physical therapy for at least three days thereafter.  You may apply ice to the injection site, 20 minutes on, 20 minutes off, as needed. Do not apply ice directly to skin.    Common Side Effects:  Headaches- take your usual medications as directed by your physician.  Increase your fluid intake.  Caffeinated beverages may be helpful.  Lie flat in bed until your headache resolves.  Restlessness or inability to sleep- you may have trouble sleeping for the next few days.  Ask your referring physician if you need any medication for sleep.  Facial flushing or redness- should subside within a few days.  Increased pain- a temporary increase in pain a day or two following your procedure is not unusual.  Take your pain medication as prescribed by your referring physician.  Leg cramps  Please contact our office at [phone] for the following symptoms: Fever greater than 100 degrees. Headaches unresolved with medication after 2-3 days. Increased swelling, pain, or redness at injection site.   Thank you for visiting [facility] today.   YOU MAY RESUME YOUR ASPIRIN ANYTIME AFTER YOUR PROCEDURE TODAY.  YOU MAY RESUME YOUR COUMADIN PER YOU MD.

## 2023-03-05 ENCOUNTER — Ambulatory Visit
Admission: RE | Admit: 2023-03-05 | Discharge: 2023-03-05 | Disposition: A | Discharge status: Home / Self Care | Payer: Commercial Managed Care - PPO | Source: Ambulatory Visit | Attending: Family Medicine | Admitting: Family Medicine

## 2023-03-05 ENCOUNTER — Ambulatory Visit: Payer: Self-pay

## 2023-03-05 DIAGNOSIS — M545 Low back pain, unspecified: Secondary | ICD-10-CM | POA: Diagnosis not present

## 2023-03-05 DIAGNOSIS — M5416 Radiculopathy, lumbar region: Secondary | ICD-10-CM

## 2023-03-05 LAB — POCT INR: INR: 1.2 — AB (ref 2.0–3.0)

## 2023-03-05 MED ORDER — IOPAMIDOL (ISOVUE-M 200) INJECTION 41%
1.0000 mL | Freq: Once | INTRAMUSCULAR | Status: AC
  Administered 2023-03-05: 1 mL via EPIDURAL

## 2023-03-05 MED ORDER — METHYLPREDNISOLONE ACETATE 40 MG/ML INJ SUSP (RADIOLOG
80.0000 mg | Freq: Once | INTRAMUSCULAR | Status: AC
  Administered 2023-03-05: 80 mg via EPIDURAL

## 2023-03-05 NOTE — Patient Instructions (Addendum)
Pre visit review using our clinic review tool, if applicable. No additional management support is needed unless otherwise documented below in the visit note.  Continue lovenox bridge instructions.

## 2023-03-05 NOTE — Telephone Encounter (Signed)
Pt's wife sent in INR this morning before lumbar epidural scheduled for today. INR was 1.2. This is appropriate for the day of the procedure. Pt will continue with lovenox bridge instructions following procedure.

## 2023-03-05 NOTE — Progress Notes (Addendum)
Pt tests at home. Result is placed on [employer] website by pt. INR today is 1.2 before lumbar epidural per request of radiology. Pt scheduled for lumbar epidural today and has been on a lovenox bridge. Pt's wife sent in INR this morning before lumbar epidural scheduled for today. INR was 1.2. This is appropriate for the day of the procedure. Pt will continue with lovenox bridge instructions following procedure. No new dosing instructions were given.

## 2023-03-09 ENCOUNTER — Other Ambulatory Visit: Payer: Self-pay

## 2023-03-09 ENCOUNTER — Other Ambulatory Visit (HOSPITAL_COMMUNITY): Payer: Self-pay

## 2023-03-12 ENCOUNTER — Ambulatory Visit (INDEPENDENT_AMBULATORY_CARE_PROVIDER_SITE_OTHER): Payer: Commercial Managed Care - PPO

## 2023-03-12 DIAGNOSIS — Z7901 Long term (current) use of anticoagulants: Secondary | ICD-10-CM

## 2023-03-12 LAB — POCT INR: INR: 3.2 — AB (ref 2.0–3.0)

## 2023-03-12 NOTE — Patient Instructions (Addendum)
Pre visit review using our clinic review tool, if applicable. No additional management support is needed unless otherwise documented below in the visit note.  Reduce dose today to take 1/2 tablet and then continue 1 tablet daily except take 1 1/2 tablets on Thursday. Recheck in 2 weeks, on 11/7.

## 2023-03-12 NOTE — Progress Notes (Signed)
Pt tests at home. Result is placed on [employer] website by pt. INR today is 3.2  Pt had lumbar injection on 10/14 and was placed on a lovenox bridge.  Reduce dose today to take 1/2 tablet and then continue 1 tablet daily except take 1 1/2 tablets on Thursday. Recheck in 2 weeks, on 11/7. Sent pt mychart msg with dosing and retest day.

## 2023-03-15 ENCOUNTER — Other Ambulatory Visit (HOSPITAL_COMMUNITY): Payer: Self-pay

## 2023-03-16 ENCOUNTER — Other Ambulatory Visit: Payer: Commercial Managed Care - PPO

## 2023-03-26 ENCOUNTER — Other Ambulatory Visit (HOSPITAL_COMMUNITY): Payer: Self-pay

## 2023-03-26 ENCOUNTER — Other Ambulatory Visit: Payer: Self-pay | Admitting: Cardiology

## 2023-03-26 MED ORDER — CARVEDILOL 6.25 MG PO TABS
6.2500 mg | ORAL_TABLET | Freq: Two times a day (BID) | ORAL | 3 refills | Status: DC
  Filled 2023-03-26: qty 180, 90d supply, fill #0
  Filled 2023-07-07 – 2023-07-09 (×2): qty 180, 90d supply, fill #1
  Filled 2023-09-30: qty 180, 90d supply, fill #2
  Filled 2024-01-14: qty 180, 90d supply, fill #3

## 2023-03-28 ENCOUNTER — Encounter: Payer: Self-pay | Admitting: Cardiology

## 2023-04-02 ENCOUNTER — Other Ambulatory Visit (HOSPITAL_COMMUNITY): Payer: Self-pay

## 2023-04-03 ENCOUNTER — Encounter: Payer: Self-pay | Admitting: Family Medicine

## 2023-04-03 ENCOUNTER — Ambulatory Visit (INDEPENDENT_AMBULATORY_CARE_PROVIDER_SITE_OTHER): Payer: Self-pay

## 2023-04-03 ENCOUNTER — Other Ambulatory Visit: Payer: Self-pay

## 2023-04-03 DIAGNOSIS — Z7901 Long term (current) use of anticoagulants: Secondary | ICD-10-CM

## 2023-04-03 DIAGNOSIS — G8929 Other chronic pain: Secondary | ICD-10-CM

## 2023-04-03 LAB — POCT INR: INR: 2.7 (ref 2.0–3.0)

## 2023-04-03 NOTE — Progress Notes (Signed)
Pt tests at home. Result is placed on [employer] website by pt. INR today is 2.7  Continue 1 tablet daily except take 1 1/2 tablets on Thursday. Recheck in 2 weeks, on 11/26. Sent pt mychart msg with dosing and retest day.

## 2023-04-03 NOTE — Patient Instructions (Addendum)
Pre visit review using our clinic review tool, if applicable. No additional management support is needed unless otherwise documented below in the visit note.  Continue 1 tablet daily except take 1 1/2 tablets on Thursday. Recheck in 2 weeks, on 11/26.

## 2023-04-03 NOTE — Telephone Encounter (Signed)
MRI ordered. Patient notified

## 2023-04-08 ENCOUNTER — Ambulatory Visit: Payer: Commercial Managed Care - PPO

## 2023-04-08 DIAGNOSIS — M5441 Lumbago with sciatica, right side: Secondary | ICD-10-CM

## 2023-04-08 DIAGNOSIS — M48061 Spinal stenosis, lumbar region without neurogenic claudication: Secondary | ICD-10-CM | POA: Diagnosis not present

## 2023-04-08 DIAGNOSIS — M5136 Other intervertebral disc degeneration, lumbar region with discogenic back pain only: Secondary | ICD-10-CM | POA: Diagnosis not present

## 2023-04-08 DIAGNOSIS — G8929 Other chronic pain: Secondary | ICD-10-CM | POA: Diagnosis not present

## 2023-04-08 DIAGNOSIS — M5127 Other intervertebral disc displacement, lumbosacral region: Secondary | ICD-10-CM | POA: Diagnosis not present

## 2023-04-09 ENCOUNTER — Other Ambulatory Visit (HOSPITAL_COMMUNITY): Payer: Self-pay

## 2023-04-18 ENCOUNTER — Ambulatory Visit (INDEPENDENT_AMBULATORY_CARE_PROVIDER_SITE_OTHER): Payer: Commercial Managed Care - PPO

## 2023-04-18 DIAGNOSIS — Z7901 Long term (current) use of anticoagulants: Secondary | ICD-10-CM

## 2023-04-18 DIAGNOSIS — D6861 Antiphospholipid syndrome: Secondary | ICD-10-CM | POA: Diagnosis not present

## 2023-04-18 LAB — POCT INR: INR: 3.2 — AB (ref 2.0–3.0)

## 2023-04-18 NOTE — Progress Notes (Signed)
Pt tests at home. Result is placed on [employer] website by pt. INR today is 3.2. Pt has been having more back pain.  Reduce dose today to take 1/2 tablet and then continue 1 tablet daily except take 1 1/2 tablets on Thursday. Recheck in 2 weeks, on 12/11. Sent pt mychart msg with dosing and retest day.

## 2023-04-18 NOTE — Patient Instructions (Addendum)
Pre visit review using our clinic review tool, if applicable. No additional management support is needed unless otherwise documented below in the visit note.  Reduce dose today to take 1/2 tablet and then continue 1 tablet daily except take 1 1/2 tablets on Thursday. Recheck in 2 weeks, on 12/11.

## 2023-04-20 ENCOUNTER — Other Ambulatory Visit (HOSPITAL_COMMUNITY): Payer: Self-pay

## 2023-04-26 ENCOUNTER — Encounter: Payer: Self-pay | Admitting: Family Medicine

## 2023-04-30 ENCOUNTER — Ambulatory Visit: Payer: Commercial Managed Care - PPO | Admitting: Family Medicine

## 2023-05-02 ENCOUNTER — Ambulatory Visit (INDEPENDENT_AMBULATORY_CARE_PROVIDER_SITE_OTHER): Payer: Self-pay

## 2023-05-02 DIAGNOSIS — Z7901 Long term (current) use of anticoagulants: Secondary | ICD-10-CM

## 2023-05-02 LAB — POCT INR: INR: 2.9 (ref 2.0–3.0)

## 2023-05-02 NOTE — Patient Instructions (Addendum)
Pre visit review using our clinic review tool, if applicable. No additional management support is needed unless otherwise documented below in the visit note.  Continue 1 tablet daily except take 1 1/2 tablets on Thursday. Recheck in 2 weeks, on 12/23.

## 2023-05-02 NOTE — Progress Notes (Signed)
Pt tests at home. Result is placed on [employer] website by pt. INR today is 2.9. Pt has been having more back pain.  Continue 1 tablet daily except take 1 1/2 tablets on Thursday. Recheck in 2 weeks, on 12/23. Sent pt mychart msg with dosing and retest day.

## 2023-05-02 NOTE — Progress Notes (Signed)
Pt is following up with ortho

## 2023-05-02 NOTE — Progress Notes (Signed)
I have reviewed and agree with note, evaluation, plan.  Recommend follow-up with PCP or orthopedist about pain issues  Tana Conch, MD

## 2023-05-11 ENCOUNTER — Encounter: Payer: Self-pay | Admitting: Family Medicine

## 2023-05-11 ENCOUNTER — Ambulatory Visit (INDEPENDENT_AMBULATORY_CARE_PROVIDER_SITE_OTHER): Payer: Commercial Managed Care - PPO | Admitting: Family Medicine

## 2023-05-11 VITALS — BP 132/80 | HR 79 | Temp 97.8°F | Ht 69.0 in | Wt 180.2 lb

## 2023-05-11 DIAGNOSIS — R760 Raised antibody titer: Secondary | ICD-10-CM

## 2023-05-11 DIAGNOSIS — G8929 Other chronic pain: Secondary | ICD-10-CM

## 2023-05-11 DIAGNOSIS — J439 Emphysema, unspecified: Secondary | ICD-10-CM

## 2023-05-11 DIAGNOSIS — E782 Mixed hyperlipidemia: Secondary | ICD-10-CM | POA: Diagnosis not present

## 2023-05-11 DIAGNOSIS — R5383 Other fatigue: Secondary | ICD-10-CM | POA: Diagnosis not present

## 2023-05-11 DIAGNOSIS — M5441 Lumbago with sciatica, right side: Secondary | ICD-10-CM

## 2023-05-11 DIAGNOSIS — D72829 Elevated white blood cell count, unspecified: Secondary | ICD-10-CM | POA: Diagnosis not present

## 2023-05-11 DIAGNOSIS — Z7901 Long term (current) use of anticoagulants: Secondary | ICD-10-CM

## 2023-05-11 DIAGNOSIS — D500 Iron deficiency anemia secondary to blood loss (chronic): Secondary | ICD-10-CM | POA: Diagnosis not present

## 2023-05-11 DIAGNOSIS — F339 Major depressive disorder, recurrent, unspecified: Secondary | ICD-10-CM

## 2023-05-11 DIAGNOSIS — F17218 Nicotine dependence, cigarettes, with other nicotine-induced disorders: Secondary | ICD-10-CM

## 2023-05-11 LAB — CBC WITH DIFFERENTIAL/PLATELET
Basophils Absolute: 0.1 10*3/uL (ref 0.0–0.1)
Basophils Relative: 0.9 % (ref 0.0–3.0)
Eosinophils Absolute: 0.6 10*3/uL (ref 0.0–0.7)
Eosinophils Relative: 5.6 % — ABNORMAL HIGH (ref 0.0–5.0)
HCT: 45.2 % (ref 39.0–52.0)
Hemoglobin: 15.4 g/dL (ref 13.0–17.0)
Lymphocytes Relative: 25 % (ref 12.0–46.0)
Lymphs Abs: 2.6 10*3/uL (ref 0.7–4.0)
MCHC: 34 g/dL (ref 30.0–36.0)
MCV: 96.7 fL (ref 78.0–100.0)
Monocytes Absolute: 0.5 10*3/uL (ref 0.1–1.0)
Monocytes Relative: 4.8 % (ref 3.0–12.0)
Neutro Abs: 6.6 10*3/uL (ref 1.4–7.7)
Neutrophils Relative %: 63.7 % (ref 43.0–77.0)
Platelets: 234 10*3/uL (ref 150.0–400.0)
RBC: 4.67 Mil/uL (ref 4.22–5.81)
RDW: 14 % (ref 11.5–15.5)
WBC: 10.4 10*3/uL (ref 4.0–10.5)

## 2023-05-11 LAB — COMPREHENSIVE METABOLIC PANEL
ALT: 15 U/L (ref 0–53)
AST: 17 U/L (ref 0–37)
Albumin: 4.1 g/dL (ref 3.5–5.2)
Alkaline Phosphatase: 90 U/L (ref 39–117)
BUN: 9 mg/dL (ref 6–23)
CO2: 25 meq/L (ref 19–32)
Calcium: 8.9 mg/dL (ref 8.4–10.5)
Chloride: 104 meq/L (ref 96–112)
Creatinine, Ser: 0.89 mg/dL (ref 0.40–1.50)
GFR: 96.25 mL/min (ref >60.00)
Glucose, Bld: 94 mg/dL (ref 70–99)
Potassium: 4 meq/L (ref 3.5–5.1)
Sodium: 138 meq/L (ref 135–145)
Total Bilirubin: 0.6 mg/dL (ref 0.2–1.2)
Total Protein: 7.2 g/dL (ref 6.0–8.3)

## 2023-05-11 LAB — TSH: TSH: 1.06 u[IU]/mL (ref 0.35–5.50)

## 2023-05-11 NOTE — Progress Notes (Signed)
Subjective  CC:  Chief Complaint  Patient presents with   Hyperlipidemia   Anxiety   Depression   Anemia    HPI: [name] is a 57 y.o. male who presents to the office today to address the problems listed above in the chief complaint. Discussed the use of AI scribe software for clinical note transcription with the patient, who gave verbal consent to proceed.  History of Present Illness   The patient, with a history of iron deficiency, presents for a six month check-in.  He has chronic pain managed by orthopedics and neurosurgery.  He reports low energy and a recent exacerbation of lower back pain due to two bulging discs. The back pain has caused him to be less active, which he believes may be contributing to his low energy.   He has been managing his back pain with epidurals, but anticipates needing another one after Christmas. He has had two neck surgeries in the past, which were successful in relieving nerve pain in his arm, but he continues to experience neck pain.  He reports fatigue: he has a history of iron deficiency due to chronic blood loss on chronic anticoagulants and history of hemorrhoids.  GI workup was negative at that time.  He did require requent iron infusions in the past, but has not needed an infusion since before the COVID-19 pandemic.  He reports a consistently elevated white blood cell count, which he attributes to smoking. He also reports that his INR is well-controlled.   He has COPD and is on chronic long-term smoker not interested in quitting.He reports that his breathing is okay and that his inhaler helps a lot.  No recent exacerbations.  Mood is stable.    Assessment  1. Antiphospholipid antibody positive, requires lifetime Coumadin   2. Chronic right-sided low back pain with right-sided sciatica   3. Long term (current) use of anticoagulants [Z79.01]   4. Mixed hyperlipidemia   5. Cigarette nicotine dependence with other nicotine-induced disorder    6. Pulmonary emphysema, unspecified emphysema type (HCC)   7. Major depression, recurrent, chronic (HCC)   8. Other fatigue   9. Leukocytosis, unspecified type   10. Hx of iron deficiency anemia due to chronic blood loss, hemorrhoids while on coumadin, s/p hemorrhoidectomy 11/2016      Plan  Assessment and Plan    History of iron Deficiency Anemia with associated fatigue now Iron deficiency anemia with previous need for frequent iron infusions. No infusions since before COVID-19. Current symptoms include low energy. No recent melena, hematochezia, or abdominal pain. Hemoglobin previously dropped to 4, requiring emergency intervention. Chronic leukocytosis likely due to smoking. Concerned about not having iron levels checked recently. - Order CBC and iron studies  Chronic Low Back Pain with Bulging Discs Chronic low back pain with two bulging discs, one central and low, the other higher and right-sided causing leg pain. Recent MRI confirmed bulging discs. Managed with epidural injections; planning another post-Christmas. Prefers to delay surgery and continue with epidural injections despite long-term use. - Plan for epidural injection after Christmas  Chronic Neck Pain Post-Surgery Chronic neck pain post two surgeries. Surgeries alleviated arm nerve pain but not neck pain. Muscle relaxers provide some relief.  Continue inhaler for COPD Continue mood medicines  General Health Maintenance Routine six-month check-in. No new concerns.   Follow-up in June for complete physical - Advise to call if any new issues arise.      Orders Placed This Encounter  Procedures  CBC with Differential/Platelet   Iron, TIBC and Ferritin Panel   Comprehensive metabolic panel   TSH   No orders of the defined types were placed in this encounter.    I reviewed the patients updated PMH, FH, and SocHx.    Patient Active Problem List   Diagnosis Date Noted   Polycythemia 02/02/2019    Priority:  High   PVC's (premature ventricular contractions) 02/02/2019    Priority: High   Pulmonary emphysema (HCC) 08/04/2018    Priority: High   Chronic pain disorder 08/04/2018    Priority: High   Chronic right-sided low back pain with right-sided sciatica 11/19/2016    Priority: High   Long term (current) use of anticoagulants [Z79.01] 09/15/2016    Priority: High   Mixed hyperlipidemia 06/27/2016    Priority: High   Antiphospholipid antibody positive, requires lifetime Coumadin 06/27/2016    Priority: High   PAOD (peripheral arterrial occlusion disease) (HCC) 06/02/2016    Priority: High   Nicotine dependence 06/24/2014    Priority: High   Major depression, recurrent, chronic (HCC) 02/02/2019    Priority: Medium    Cervical pseudoarthrosis (HCC 07/05/2018    Priority: Medium    History of alcohol abuse, sober since 04/2016 07/11/2016    Priority: Medium    Gastroesophageal reflux disease, with small hiatal hernia, Rx Nexium     Priority: Medium    Degenerative arthritis of right knee 09/06/2017    Priority: Low   History of neutrophilia 06/18/2017    Priority: Low   Erectile dysfunction 11/19/2016    Priority: Low   Hx of iron deficiency anemia due to chronic blood loss, hemorrhoids while on coumadin, s/p hemorrhoidectomy 11/2016     Priority: Low   Anxiety disorder 02/02/2019   Arthritis of left acromioclavicular joint 09/29/2016   Current Meds  Medication Sig   aspirin EC 81 MG tablet Take 1 tablet (81 mg total) by mouth daily. Swallow whole.   carvedilol (COREG) 6.25 MG tablet Take 1 tablet (6.25 mg total) by mouth 2 (two) times daily.   Cholecalciferol (VITAMIN D3) 50 MCG CAPS Take 2,000 Units by mouth daily at 12 noon.   cyclobenzaprine (FLEXERIL) 10 MG tablet Take 1 tablet (10 mg total) by mouth at bedtime as needed for muscle spasms.   esomeprazole (NEXIUM) 20 MG capsule Take 20 mg by mouth daily before lunch.   FLUoxetine (PROZAC) 20 MG capsule Take 1 capsule (20 mg  total) by mouth daily.   Fluticasone-Umeclidin-Vilant 100-62.5-25 MCG/ACT AEPB Inhale 1 puff into the lungs daily. (Patient taking differently: Inhale 1 puff into the lungs daily in the afternoon.)   rosuvastatin (CRESTOR) 10 MG tablet Take 1 tablet (10 mg total) by mouth daily. (Patient taking differently: Take 10 mg by mouth daily with lunch.)   triamcinolone (NASACORT ALLERGY 24HR) 55 MCG/ACT AERO nasal inhaler Place 1-2 sprays into the nose daily.   warfarin (COUMADIN) 6 MG tablet TAKE 1 TABLET BY MOUTH DAILY EXCEPT TAKE 1/2 TABLET ON MONDAYS AND THURSDAYS OR AS DIRECTED BY ANTICOAGULATION CLINIC    Allergies: Patient is allergic to cymbalta [duloxetine hcl]. Family History: Patient family history includes Lung cancer in his father. Social History:  Patient  reports that he has been smoking cigarettes and e-cigarettes. He has a 42 pack-year smoking history. He has never used smokeless tobacco. He reports that he does not drink alcohol and does not use drugs.  Review of Systems: Constitutional: Negative for fever malaise or anorexia Cardiovascular: negative for chest  pain Respiratory: negative for SOB or persistent cough Gastrointestinal: negative for abdominal pain  Objective  Vitals: BP 132/80   Pulse 79   Temp 97.8 F (36.6 C)   Ht 5\' 9"  (1.753 m)   Wt 180 lb 3.2 oz (81.7 kg)   SpO2 96%   BMI 26.61 kg/m  General: no acute distress , A&Ox3, hoarse voice HEENT: PEERL, conjunctiva normal, neck is supple Cardiovascular:  RRR without murmur or gallop.  No edema Respiratory:  Good breath sounds bilaterally, CTAB with normal respiratory effort Skin:  Warm, no rashes  Commons side effects, risks, benefits, and alternatives for medications and treatment plan prescribed today were discussed, and the patient expressed understanding of the given instructions. Patient is instructed to call or message via MyChart if he/she has any questions or concerns regarding our treatment plan. No  barriers to understanding were identified. We discussed Red Flag symptoms and signs in detail. Patient expressed understanding regarding what to do in case of urgent or emergency type symptoms.  Medication list was reconciled, printed and provided to the patient in AVS. Patient instructions and summary information was reviewed with the patient as documented in the AVS. This note was prepared with assistance of Dragon voice recognition software. Occasional wrong-word or sound-a-like substitutions may have occurred due to the inherent limitations of voice recognition software

## 2023-05-11 NOTE — Patient Instructions (Signed)
Please return in 6 months for your annual complete physical; please come fasting.    If you have any questions or concerns, please don't hesitate to send me a message via MyChart or call the office at [phone]. Thank you for visiting with [place] today! It's our pleasure caring for you.   VISIT SUMMARY:  During your six-month check-in, we discussed your ongoing issues with iron deficiency anemia, chronic low back pain due to bulging discs, and chronic neck pain post-surgery. We also reviewed your general health maintenance and respiratory status.  YOUR PLAN:  -IRON DEFICIENCY ANEMIA: Iron deficiency anemia occurs when your body doesn't have enough iron to produce adequate levels of hemoglobin, leading to low energy and fatigue. We will order a complete blood count (CBC) and iron studies to check your current iron levels.  -CHRONIC LOW BACK PAIN WITH BULGING DISCS: Chronic low back pain due to bulging discs can cause significant discomfort and limit activity. You have been managing this with epidural injections and plan to have another injection after Christmas. Surgery is being delayed in favor of continued epidural treatment.  -CHRONIC NECK PAIN POST-SURGERY: Chronic neck pain persists despite two successful surgeries that relieved nerve pain in your arm. Muscle relaxers are providing some relief for your neck pain.  -GENERAL HEALTH MAINTENANCE: Your respiratory status is well-managed with your inhaler, and no new concerns were noted during this visit. Routine labs and studies were reviewed and found to be normal. Follow-up is scheduled for June, and you are advised to call if any new issues arise.  INSTRUCTIONS:  Please follow up in June for your next routine check-in. If you experience any new issues or concerns before then, do not hesitate to call our office. We will also be conducting a complete blood count (CBC) and iron studies to check your current iron levels.

## 2023-05-12 LAB — IRON,TIBC AND FERRITIN PANEL
%SAT: 19 % — ABNORMAL LOW (ref 20–48)
Ferritin: 47 ng/mL (ref 38–380)
Iron: 62 ug/dL (ref 50–180)
TIBC: 328 ug/dL (ref 250–425)

## 2023-05-14 ENCOUNTER — Ambulatory Visit (INDEPENDENT_AMBULATORY_CARE_PROVIDER_SITE_OTHER): Payer: Self-pay

## 2023-05-14 DIAGNOSIS — Z7901 Long term (current) use of anticoagulants: Secondary | ICD-10-CM

## 2023-05-14 LAB — POCT INR: INR: 2.7 (ref 2.0–3.0)

## 2023-05-14 NOTE — Progress Notes (Signed)
Pt tests at home. Result is placed on [employer] website by pt. INR today is 2.7. Continue 1 tablet daily except take 1 1/2 tablets on Thursday. Recheck in 2 weeks, on 1/7. Sent pt mychart msg with dosing and retest day.

## 2023-05-14 NOTE — Patient Instructions (Addendum)
Pre visit review using our clinic review tool, if applicable. No additional management support is needed unless otherwise documented below in the visit note.  Continue 1 tablet daily except take 1 1/2 tablets on Thursday. Recheck in 2 weeks, on 1/7.

## 2023-05-14 NOTE — Progress Notes (Signed)
See mychart note Dear Mr. [name], Your lab results look fine. Your iron stores are fairly good and you are not anemic. Your thyroid is normal and your blood chemistry results are all normal as well.  Sincerely, Dr. Mardelle Matte

## 2023-05-29 ENCOUNTER — Ambulatory Visit (INDEPENDENT_AMBULATORY_CARE_PROVIDER_SITE_OTHER): Payer: Commercial Managed Care - PPO

## 2023-05-29 ENCOUNTER — Encounter: Payer: Self-pay | Admitting: Cardiology

## 2023-05-29 DIAGNOSIS — Z7901 Long term (current) use of anticoagulants: Secondary | ICD-10-CM

## 2023-05-29 LAB — POCT INR: INR: 3.2 — AB (ref 2.0–3.0)

## 2023-05-29 NOTE — Patient Instructions (Addendum)
 Pre visit review using our clinic review tool, if applicable. No additional management support is needed unless otherwise documented below in the visit note.  Reduce dose today to 1/2 tablet and then continue 1 tablet daily except take 1 1/2 tablets on Thursday. Recheck in 2 weeks, on 1/21.

## 2023-05-29 NOTE — Progress Notes (Signed)
 Pt tests at home. Result is placed on [employer] website by pt. INR today is 3.2. Reduce dose today to 1/2 tablet and then continue 1 tablet daily except take 1 1/2 tablets on Thursday. Recheck in 2 weeks, on 1/21. Sent pt mychart msg with dosing and retest day.

## 2023-06-15 LAB — POCT INR: INR: 3.2 — AB (ref 2.0–3.0)

## 2023-06-18 ENCOUNTER — Ambulatory Visit (INDEPENDENT_AMBULATORY_CARE_PROVIDER_SITE_OTHER): Payer: Self-pay

## 2023-06-18 DIAGNOSIS — Z7901 Long term (current) use of anticoagulants: Secondary | ICD-10-CM | POA: Diagnosis not present

## 2023-06-18 DIAGNOSIS — D6861 Antiphospholipid syndrome: Secondary | ICD-10-CM | POA: Diagnosis not present

## 2023-06-18 LAB — POCT INR: INR: 2.6 (ref 2.0–3.0)

## 2023-06-18 NOTE — Patient Instructions (Addendum)
Pre visit review using our clinic review tool, if applicable. No additional management support is needed unless otherwise documented below in the visit note.  Continue 1 tablet daily except take 1 1/2 tablets on Thursday. Recheck in 2 weeks, on 2/10.

## 2023-06-18 NOTE — Progress Notes (Signed)
Pt tests at home and was scheduled to test last week. Pt sent mychart msg today that he did test on Friday, 1/24 and INR was 3.3 so he held a dose. This result was not communicated to coumadin clinic or placed on [employer] webiste. Pt also reported that he tested again today and INR is 2.6.  Result is placed on [employer] website by pt. INR today is 2.6. Continue 1 tablet daily except take 1 1/2 tablets on Thursday. Recheck in 2 weeks, on 2/10. Sent pt mychart msg with dosing and retest day.

## 2023-06-23 ENCOUNTER — Encounter (HOSPITAL_COMMUNITY): Payer: Self-pay

## 2023-06-23 ENCOUNTER — Other Ambulatory Visit (HOSPITAL_COMMUNITY): Payer: Self-pay

## 2023-07-02 LAB — POCT INR: INR: 2.9 (ref 2.0–3.0)

## 2023-07-03 ENCOUNTER — Ambulatory Visit (INDEPENDENT_AMBULATORY_CARE_PROVIDER_SITE_OTHER): Payer: Self-pay

## 2023-07-03 DIAGNOSIS — Z7901 Long term (current) use of anticoagulants: Secondary | ICD-10-CM | POA: Diagnosis not present

## 2023-07-03 NOTE — Progress Notes (Cosign Needed)
Pt tests at home. Result was placed on [employer] website by pt after hours last night. INR was 2.9. Continue 1 tablet daily except take 1 1/2 tablets on Thursday. Recheck in 2 weeks, on 2/25. Sent pt mychart msg with dosing and retest day.  Medical screening examination/treatment/procedure(s) were performed by non-physician practitioner and as supervising physician I was immediately available for consultation/collaboration.  I agree with above. Jacinta Shoe, MD

## 2023-07-03 NOTE — Patient Instructions (Addendum)
Pre visit review using our clinic review tool, if applicable. No additional management support is needed unless otherwise documented below in the visit note.  Continue 1 tablet daily except take 1 1/2 tablets on Thursday. Recheck in 2 weeks, on 2/25.

## 2023-07-07 ENCOUNTER — Other Ambulatory Visit: Payer: Self-pay | Admitting: Family Medicine

## 2023-07-07 ENCOUNTER — Other Ambulatory Visit (HOSPITAL_COMMUNITY): Payer: Self-pay

## 2023-07-07 DIAGNOSIS — D6859 Other primary thrombophilia: Secondary | ICD-10-CM

## 2023-07-09 ENCOUNTER — Other Ambulatory Visit (HOSPITAL_COMMUNITY): Payer: Self-pay

## 2023-07-09 NOTE — Telephone Encounter (Signed)
12/202/2024  LOV  12/25/2022 Fill date  95/0 refills

## 2023-07-10 ENCOUNTER — Other Ambulatory Visit (HOSPITAL_COMMUNITY): Payer: Self-pay

## 2023-07-10 ENCOUNTER — Other Ambulatory Visit: Payer: Self-pay

## 2023-07-10 MED ORDER — WARFARIN SODIUM 6 MG PO TABS
ORAL_TABLET | ORAL | 1 refills | Status: DC
  Filled 2023-07-10: qty 95, 90d supply, fill #0
  Filled 2023-11-20: qty 95, 90d supply, fill #1

## 2023-07-16 ENCOUNTER — Other Ambulatory Visit: Payer: Self-pay | Admitting: Emergency Medicine

## 2023-07-16 DIAGNOSIS — F17210 Nicotine dependence, cigarettes, uncomplicated: Secondary | ICD-10-CM

## 2023-07-16 DIAGNOSIS — Z122 Encounter for screening for malignant neoplasm of respiratory organs: Secondary | ICD-10-CM

## 2023-07-16 DIAGNOSIS — Z87891 Personal history of nicotine dependence: Secondary | ICD-10-CM

## 2023-07-17 ENCOUNTER — Ambulatory Visit (INDEPENDENT_AMBULATORY_CARE_PROVIDER_SITE_OTHER): Payer: Self-pay

## 2023-07-17 DIAGNOSIS — Z7901 Long term (current) use of anticoagulants: Secondary | ICD-10-CM | POA: Diagnosis not present

## 2023-07-17 LAB — POCT INR: INR: 3.2 — AB (ref 2.0–3.0)

## 2023-07-17 NOTE — Progress Notes (Signed)
 Pt tests at home. Result was placed on [employer] website by pt.. INR was 3.2 Reduce dose today to take 1/2 tablet and then continue 1 tablet daily except take 1 1/2 tablets on Thursday. Recheck in 2 weeks, on 3/11. Sent pt mychart msg with dosing and retest day.

## 2023-07-17 NOTE — Patient Instructions (Addendum)
 Pre visit review using our clinic review tool, if applicable. No additional management support is needed unless otherwise documented below in the visit note.  Reduce dose today to take 1/2 tablet and then continue 1 tablet daily except take 1 1/2 tablets on Thursday. Recheck in 2 weeks, on 3/11.

## 2023-07-31 ENCOUNTER — Ambulatory Visit (INDEPENDENT_AMBULATORY_CARE_PROVIDER_SITE_OTHER)

## 2023-07-31 DIAGNOSIS — Z7901 Long term (current) use of anticoagulants: Secondary | ICD-10-CM | POA: Diagnosis not present

## 2023-07-31 LAB — POCT INR: INR: 3 (ref 2.0–3.0)

## 2023-07-31 NOTE — Patient Instructions (Addendum)
 Pre visit review using our clinic review tool, if applicable. No additional management support is needed unless otherwise documented below in the visit note.  Continue 1 tablet daily except take 1 1/2 tablets on Thursday. Recheck in 2 weeks, on 3/25.

## 2023-07-31 NOTE — Progress Notes (Signed)
 Pt tests at home. Result was placed on [employer] website by pt. INR was 3.0 Continue 1 tablet daily except take 1 1/2 tablets on Thursday. Recheck in 2 weeks, on 3/25. Sent pt mychart msg with dosing and retest day.

## 2023-08-06 ENCOUNTER — Other Ambulatory Visit (HOSPITAL_COMMUNITY): Payer: Self-pay

## 2023-08-14 ENCOUNTER — Ambulatory Visit (INDEPENDENT_AMBULATORY_CARE_PROVIDER_SITE_OTHER)

## 2023-08-14 DIAGNOSIS — D6861 Antiphospholipid syndrome: Secondary | ICD-10-CM | POA: Diagnosis not present

## 2023-08-14 DIAGNOSIS — Z7901 Long term (current) use of anticoagulants: Secondary | ICD-10-CM | POA: Diagnosis not present

## 2023-08-14 LAB — POCT INR: INR: 3.1 — AB (ref 2.0–3.0)

## 2023-08-14 NOTE — Patient Instructions (Addendum)
 Pre visit review using our clinic review tool, if applicable. No additional management support is needed unless otherwise documented below in the visit note.  Reduce dose today to take 1/2 tablet and then continue 1 tablet daily except take 1 1/2 tablets on Thursday. Recheck in 2 weeks, on 4/8.

## 2023-08-14 NOTE — Progress Notes (Signed)
 Pt tests at home. Result was placed on [employer] website by pt. INR was 3.1 Reduce dose today to take 1/2 tablet and then continue 1 tablet daily except take 1 1/2 tablets on Thursday. Recheck in 2 weeks, on 4/8. Sent pt mychart msg with dosing and retest day.

## 2023-08-22 ENCOUNTER — Ambulatory Visit (HOSPITAL_BASED_OUTPATIENT_CLINIC_OR_DEPARTMENT_OTHER)
Admission: RE | Admit: 2023-08-22 | Discharge: 2023-08-22 | Disposition: A | Discharge status: Home / Self Care | Payer: Commercial Managed Care - PPO | Source: Ambulatory Visit | Attending: Acute Care | Admitting: Acute Care

## 2023-08-22 DIAGNOSIS — Z122 Encounter for screening for malignant neoplasm of respiratory organs: Secondary | ICD-10-CM | POA: Insufficient documentation

## 2023-08-22 DIAGNOSIS — F17210 Nicotine dependence, cigarettes, uncomplicated: Secondary | ICD-10-CM | POA: Insufficient documentation

## 2023-08-22 DIAGNOSIS — Z87891 Personal history of nicotine dependence: Secondary | ICD-10-CM | POA: Diagnosis not present

## 2023-08-28 ENCOUNTER — Ambulatory Visit (INDEPENDENT_AMBULATORY_CARE_PROVIDER_SITE_OTHER): Payer: Self-pay

## 2023-08-28 DIAGNOSIS — Z7901 Long term (current) use of anticoagulants: Secondary | ICD-10-CM

## 2023-08-28 LAB — POCT INR: INR: 2.9 (ref 2.0–3.0)

## 2023-08-28 NOTE — Progress Notes (Cosign Needed Addendum)
 Pt tests at home. Result was placed on [employer] website by pt. INR was 2.9 Continue 1 tablet daily except take 1 1/2 tablets on Thursday. Recheck in 2 weeks, on 4/22. Sent pt mychart msg with dosing and retest day.  Medical screening examination/treatment/procedure(s) were performed by non-physician practitioner and as supervising physician I was immediately available for consultation/collaboration.  I agree with above. Jacinta Shoe, MD

## 2023-08-28 NOTE — Patient Instructions (Addendum)
 Pre visit review using our clinic review tool, if applicable. No additional management support is needed unless otherwise documented below in the visit note.  Continue 1 tablet daily except take 1 1/2 tablets on Thursday. Recheck in 2 weeks, on 4/22.

## 2023-08-29 ENCOUNTER — Other Ambulatory Visit: Payer: Self-pay

## 2023-08-29 ENCOUNTER — Ambulatory Visit (HOSPITAL_COMMUNITY): Payer: Commercial Managed Care - PPO | Attending: Cardiovascular Disease

## 2023-08-29 ENCOUNTER — Telehealth: Payer: Self-pay | Admitting: Family Medicine

## 2023-08-29 DIAGNOSIS — I493 Ventricular premature depolarization: Secondary | ICD-10-CM | POA: Diagnosis not present

## 2023-08-29 DIAGNOSIS — I5022 Chronic systolic (congestive) heart failure: Secondary | ICD-10-CM | POA: Insufficient documentation

## 2023-08-29 DIAGNOSIS — M5416 Radiculopathy, lumbar region: Secondary | ICD-10-CM

## 2023-08-29 LAB — ECHOCARDIOGRAM COMPLETE
Area-P 1/2: 3.03 cm2
Est EF: 55
S' Lateral: 3.9 cm

## 2023-08-29 NOTE — Telephone Encounter (Signed)
 Patient asked if another epidural could be ordered for him. He said that he would like to go back to the original order and not the lower spot he did last time.  He will schedule an appointment to follow up with Dr Katrinka Blazing after the epidural.

## 2023-09-03 ENCOUNTER — Ambulatory Visit: Payer: Commercial Managed Care - PPO | Attending: Cardiology | Admitting: Cardiology

## 2023-09-03 ENCOUNTER — Encounter: Payer: Self-pay | Admitting: Cardiology

## 2023-09-03 VITALS — BP 118/64 | HR 60 | Ht 69.0 in | Wt 176.0 lb

## 2023-09-03 DIAGNOSIS — I5022 Chronic systolic (congestive) heart failure: Secondary | ICD-10-CM | POA: Diagnosis not present

## 2023-09-03 DIAGNOSIS — I493 Ventricular premature depolarization: Secondary | ICD-10-CM

## 2023-09-03 NOTE — Patient Instructions (Signed)
 Medication Instructions:  Your physician recommends that you continue on your current medications as directed. Please refer to the Current Medication list given to you today.  *If you need a refill on your cardiac medications before your next appointment, please call your pharmacy*  Lab Work: None ordered.  If you have labs (blood work) drawn today and your tests are completely normal, you will receive your results only by: MyChart Message (if you have MyChart) OR A paper copy in the mail If you have any lab test that is abnormal or we need to change your treatment, we will call you to review the results.  Testing/Procedures: Your physician has requested that you have an echocardiogram. Echocardiography is a painless test that uses sound waves to create images of your heart. It provides your doctor with information about the size and shape of your heart and how well your heart's chambers and valves are working. This procedure takes approximately one hour. There are no restrictions for this procedure. Please do NOT wear cologne, perfume, aftershave, or lotions (deodorant is allowed). Please arrive 15 minutes prior to your appointment time.  Please note: We ask at that you not bring children with you during ultrasound (echo/ vascular) testing. Due to room size and safety concerns, children are not allowed in the ultrasound rooms during exams. Our front office staff cannot provide observation of children in our lobby area while testing is being conducted. An adult accompanying a patient to their appointment will only be allowed in the ultrasound room at the discretion of the ultrasound technician under special circumstances. We apologize for any inconvenience.   Follow-Up: At [facility], you and your health needs are our priority.  As part of our continuing mission to provide you with exceptional heart care, our providers are all part of one team.  This team includes your primary  Cardiologist (physician) and Advanced Practice Providers or APPs (Physician Assistants and Nurse Practitioners) who all work together to provide you with the care you need, when you need it.  Your next appointment:   12 months with Dr Lawana Pray PA      1st Floor: - Lobby - Registration  - Pharmacy  - Lab - Cafe  2nd Floor: - PV Lab - Diagnostic Testing (echo, CT, nuclear med)  3rd Floor: - Vacant  4th Floor: - TCTS (cardiothoracic surgery) - AFib Clinic - Structural Heart Clinic - Vascular Surgery  - Vascular Ultrasound  5th Floor: - [facility] (general and EP) - Clinical Pharmacy for coumadin, hypertension, lipid, weight-loss medications, and med management appointments    Valet parking services will be available as well.

## 2023-09-03 NOTE — Progress Notes (Signed)
  Electrophysiology Office Note:   Date:  09/03/2023  ID:  [name], DOB 1966-01-23, MRN [id number]  Primary Cardiologist: None Primary Heart Failure: None Electrophysiologist: Adonnis Salceda Jorja Loa, MD      History of Present Illness:   [name] is a 58 y.o. male with h/o antiphospholipid antibody syndrome, chronic systolic heart failure with nonobstructive coronary artery disease, PVCs seen today for routine electrophysiology followup.   Since last being seen in our clinic the patient reports doing well.  He is minimally aware of his PVCs.  He does not have palpitations or fatigue.  He does have shortness of breath.  He says that he does continue to smoke and has COPD.  He is unsure if his shortness of breath is related to COPD or PVCs.  he denies chest pain, palpitations, PND, orthopnea, nausea, vomiting, dizziness, syncope, edema, weight gain, or early satiety.   Review of systems complete and found to be negative unless listed in HPI.   EP Information / Studies Reviewed:    EKG is ordered today. Personal review as below.  EKG Interpretation Date/Time:  Monday September 03 2023 10:50:13 EDT Ventricular Rate:  60 PR Interval:  128 QRS Duration:  86 QT Interval:  408 QTC Calculation: 408 R Axis:   62  Text Interpretation: Sinus rhythm with frequent Premature ventricular complexes in a pattern of bigeminy When compared with ECG of 28-Feb-2023 10:03, No significant change was found Confirmed by [name] ([id number]) on 09/03/2023 10:54:13 AM     Risk Assessment/Calculations:              Physical Exam:   VS:  BP 118/64 (BP Location: Left Arm, Patient Position: Sitting, Cuff Size: Normal)   Pulse 60   Ht 5\' 9"  (1.753 m)   Wt 176 lb (79.8 kg)   SpO2 97%   BMI 25.99 kg/m    Wt Readings from Last 3 Encounters:  09/03/23 176 lb (79.8 kg)  05/11/23 180 lb 3.2 oz (81.7 kg)  02/28/23 182 lb 3.2 oz (82.6 kg)     GEN: Well nourished, well developed in no acute  distress NECK: No JVD; No carotid bruits CARDIAC: Regular rate and rhythm, no murmurs, rubs, gallops RESPIRATORY:  Clear to auscultation without rales, wheezing or rhonchi  ABDOMEN: Soft, non-tender, non-distended EXTREMITIES:  No edema; No deformity   ASSESSMENT AND PLAN:    1.  PVCs: Has been taken off of mexiletine due to side effects.  Repeat echo shows a normalized ejection fraction.  For now, he would like to continue to monitor.  I have told him that if he develops shortness of breath or fatigue that is worsening, then he needs to come back in and talk about rhythm control for his PVCs.  I did advise him to stop smoking with his history of peripheral arterial disease and stenting as well as COPD.  For now, we [name] continue to monitor.  [name] see him back in a year with an echo prior to that visit to reassess ejection fraction.  2.  Chronic systolic heart failure: Due to nonischemic cardiomyopathy.  Ejection fraction has normalized with suppression of PVCs.  He was previously on mexiletine but this was stopped as he had significant side effects.  No shows a normal ejection fraction.  Follow up with EP APP in 12 months  Signed, Rashunda Passon Jorja Loa, MD

## 2023-09-04 NOTE — Telephone Encounter (Addendum)
 Procedure: Lumbar Epidural Scheduled: 09/28/23 Lovenox bridge will be needed  Pt Wt: 79.8 kg CrCl: 104.61 mL/min Current warfarin dose; Take 1 tablet (6 mg) daily excep take 1 1/2 tablets (9 mg) on Thursdays. Lovenox dosage recommended: 120 mg, every day, 9 syringes needed  5/4: Take last dose of warfarin 5/5: NO warfarin, NO Lovenox 5/6: NO warfarin, Lovenox AM only 5/7: NO warfarin, Lovenox AM only 5/8: NO warfarin, Lovenox AM only(BEFORE 7 AM)  5/9: PROCEDURE; NO WARFARIN, NO LOVENOX  5/10: Take 1 1/2 tablets (9 mg) warfarin, Lovenox AM only 5/11: Take 1 1/2 tablets (9 mg) warfarin, Lovenox AM only 5/12: Take 1 1/2 tablets (9 mg) warfarin, Lovenox AM only 5/13: Take 1 tablet (6 mg) warfarin, Lovenox AM only 5/14: Take 1 tablet (6 mg) warfarin, Lovenox AM only 5/15: Take 1 1/2 tablets (9 mg) warfarin, Lovenox AM only 5/16: Recheck INR; NO warfarin, NO Lovenox until after INR check.

## 2023-09-11 ENCOUNTER — Ambulatory Visit (INDEPENDENT_AMBULATORY_CARE_PROVIDER_SITE_OTHER): Payer: Self-pay

## 2023-09-11 DIAGNOSIS — Z7901 Long term (current) use of anticoagulants: Secondary | ICD-10-CM | POA: Diagnosis not present

## 2023-09-11 LAB — POCT INR: INR: 2.8 (ref 2.0–3.0)

## 2023-09-11 NOTE — Progress Notes (Signed)
 Pt tests at home. Result was placed on [employer] website by pt. INR was 2.8 Pt scheduled for lumbar epidural on 5/9 and will be placed on a lovenox  bridge. Will send Lovenox  bridge instructions after PCP review. Will send via mychart. Continue 1 tablet daily except take 1 1/2 tablets on Thursday. Recheck in 2 weeks, on 5/16. Sent pt mychart msg with dosing and retest day.

## 2023-09-11 NOTE — Patient Instructions (Addendum)
 Pre visit review using our clinic review tool, if applicable. No additional management support is needed unless otherwise documented below in the visit note.  Continue 1 tablet daily except take 1 1/2 tablets on Thursday. Recheck in 2 weeks, on 5/16.

## 2023-09-13 ENCOUNTER — Other Ambulatory Visit (HOSPITAL_COMMUNITY): Payer: Self-pay

## 2023-09-13 ENCOUNTER — Other Ambulatory Visit: Payer: Self-pay

## 2023-09-13 MED ORDER — ENOXAPARIN SODIUM 120 MG/0.8ML IJ SOSY
120.0000 mg | PREFILLED_SYRINGE | INTRAMUSCULAR | 0 refills | Status: DC
  Filled 2023-09-13: qty 7.2, 9d supply, fill #0

## 2023-09-13 NOTE — Addendum Note (Signed)
 Addended by: Ian Maine A on: 09/13/2023 07:46 AM   Modules accepted: Orders

## 2023-09-21 ENCOUNTER — Other Ambulatory Visit: Payer: Self-pay

## 2023-09-21 DIAGNOSIS — Z87891 Personal history of nicotine dependence: Secondary | ICD-10-CM

## 2023-09-21 DIAGNOSIS — Z122 Encounter for screening for malignant neoplasm of respiratory organs: Secondary | ICD-10-CM

## 2023-09-21 DIAGNOSIS — F17210 Nicotine dependence, cigarettes, uncomplicated: Secondary | ICD-10-CM

## 2023-09-25 ENCOUNTER — Other Ambulatory Visit: Payer: Self-pay | Admitting: Family Medicine

## 2023-09-26 ENCOUNTER — Other Ambulatory Visit (HOSPITAL_COMMUNITY): Payer: Self-pay

## 2023-09-26 ENCOUNTER — Other Ambulatory Visit: Payer: Self-pay

## 2023-09-26 MED ORDER — TRELEGY ELLIPTA 100-62.5-25 MCG/ACT IN AEPB
1.0000 | INHALATION_SPRAY | Freq: Every day | RESPIRATORY_TRACT | 11 refills | Status: AC
  Filled 2023-09-26: qty 60, 30d supply, fill #0
  Filled 2023-12-08: qty 60, 30d supply, fill #1
  Filled 2024-02-18: qty 60, 30d supply, fill #2
  Filled 2024-05-04: qty 60, 30d supply, fill #3

## 2023-09-27 NOTE — Discharge Instructions (Addendum)
 Post Procedure Spinal Discharge Instruction Sheet  You may resume a regular diet and any medications that you routinely take (including pain medications) unless otherwise noted by MD.  No driving day of procedure.  Light activity throughout the rest of the day.  Do not do any strenuous work, exercise, bending or lifting.  The day following the procedure, you can resume normal physical activity but you should refrain from exercising or physical therapy for at least three days thereafter.  You may apply ice to the injection site, 20 minutes on, 20 minutes off, as needed. Do not apply ice directly to skin.    Common Side Effects:  Headaches- take your usual medications as directed by your physician.  Increase your fluid intake.  Caffeinated beverages may be helpful.  Lie flat in bed until your headache resolves.  Restlessness or inability to sleep- you may have trouble sleeping for the next few days.  Ask your referring physician if you need any medication for sleep.  Facial flushing or redness- should subside within a few days.  Increased pain- a temporary increase in pain a day or two following your procedure is not unusual.  Take your pain medication as prescribed by your referring physician.  Leg cramps  Please contact our office at [phone] for the following symptoms: Fever greater than 100 degrees. Headaches unresolved with medication after 2-3 days. Increased swelling, pain, or redness at injection site.   MAY RESUME YOUR WARFARIN TOMORROW MORNING.  Thank you for visiting [facility] today.

## 2023-09-28 ENCOUNTER — Ambulatory Visit
Admission: RE | Admit: 2023-09-28 | Discharge: 2023-09-28 | Disposition: A | Discharge status: Home / Self Care | Source: Ambulatory Visit | Attending: Family Medicine | Admitting: Family Medicine

## 2023-09-28 ENCOUNTER — Ambulatory Visit (INDEPENDENT_AMBULATORY_CARE_PROVIDER_SITE_OTHER): Payer: Self-pay

## 2023-09-28 DIAGNOSIS — Z7901 Long term (current) use of anticoagulants: Secondary | ICD-10-CM | POA: Diagnosis not present

## 2023-09-28 DIAGNOSIS — M5117 Intervertebral disc disorders with radiculopathy, lumbosacral region: Secondary | ICD-10-CM | POA: Diagnosis not present

## 2023-09-28 DIAGNOSIS — M5116 Intervertebral disc disorders with radiculopathy, lumbar region: Secondary | ICD-10-CM | POA: Diagnosis not present

## 2023-09-28 DIAGNOSIS — M4727 Other spondylosis with radiculopathy, lumbosacral region: Secondary | ICD-10-CM | POA: Diagnosis not present

## 2023-09-28 DIAGNOSIS — M5416 Radiculopathy, lumbar region: Secondary | ICD-10-CM

## 2023-09-28 LAB — POCT INR: INR: 1.3 — AB (ref 2.0–3.0)

## 2023-09-28 MED ORDER — IOPAMIDOL (ISOVUE-M 200) INJECTION 41%
1.0000 mL | Freq: Once | INTRAMUSCULAR | Status: AC
  Administered 2023-09-28: 1 mL via EPIDURAL

## 2023-09-28 MED ORDER — METHYLPREDNISOLONE ACETATE 40 MG/ML INJ SUSP (RADIOLOG
80.0000 mg | Freq: Once | INTRAMUSCULAR | Status: AC
  Administered 2023-09-28: 80 mg via EPIDURAL

## 2023-09-28 NOTE — Progress Notes (Signed)
 Pt tests at home. Result was placed on [employer] website by pt. INR was 1.3 Pt scheduled for lumbar epidural today and has been on a Lovenox  bridge. Pt had to test before procedure today and result needed added to chart.  Follow prior Lovenox  bridge instructions given and retest on 5/16.

## 2023-09-28 NOTE — Patient Instructions (Addendum)
 Pre visit review using our clinic review tool, if applicable. No additional management support is needed unless otherwise documented below in the visit note.  Follow prior Lovenox  bridge instructions given and retest on 5/16.

## 2023-09-30 ENCOUNTER — Other Ambulatory Visit: Payer: Self-pay | Admitting: Family Medicine

## 2023-09-30 ENCOUNTER — Other Ambulatory Visit (HOSPITAL_COMMUNITY): Payer: Self-pay

## 2023-10-01 ENCOUNTER — Other Ambulatory Visit: Payer: Self-pay

## 2023-10-01 ENCOUNTER — Other Ambulatory Visit (HOSPITAL_COMMUNITY): Payer: Self-pay

## 2023-10-01 MED ORDER — ROSUVASTATIN CALCIUM 10 MG PO TABS
10.0000 mg | ORAL_TABLET | Freq: Every day | ORAL | 3 refills | Status: AC
  Filled 2023-10-01: qty 90, 90d supply, fill #0
  Filled 2024-01-21: qty 90, 90d supply, fill #1
  Filled 2024-05-04: qty 90, 90d supply, fill #2

## 2023-10-05 ENCOUNTER — Ambulatory Visit (INDEPENDENT_AMBULATORY_CARE_PROVIDER_SITE_OTHER): Payer: Self-pay

## 2023-10-05 DIAGNOSIS — Z7901 Long term (current) use of anticoagulants: Secondary | ICD-10-CM

## 2023-10-05 DIAGNOSIS — D6861 Antiphospholipid syndrome: Secondary | ICD-10-CM | POA: Diagnosis not present

## 2023-10-05 LAB — POCT INR: INR: 3.5 — AB (ref 2.0–3.0)

## 2023-10-05 NOTE — Patient Instructions (Addendum)
 Pre visit review using our clinic review tool, if applicable. No additional management support is needed unless otherwise documented below in the visit note.  Stop Lovenox  injections. Hold warfarin today and then continue 1 tablet (6 mg) daily except take 1 1/2 tablets (9 mg) on Thursday. Recheck in one week. If any signs or symptoms of abnormal bruising or bleeding go to the emergency room.

## 2023-10-05 NOTE — Progress Notes (Signed)
 Pt tests at home. Result was placed on [employer] website by pt. INR was 3.5 Pt had lumbar epidural on 5/9 and was on a Lovenox  bridge. Pt has been stable on this dose prior to procedure. Will hold dose today and not make any change to weekly dose and recheck in 1 week. Supratherapeutic INR could be due to booster warfarin doses after procedure.  Stop Lovenox  injections. Hold warfarin today and then continue 1 tablet (6 mg) daily except take 1 1/2 tablets (9 mg) on Thursday. Recheck in one week. If any signs or symptoms of abnormal bruising or bleeding go to the emergency room.  Sent pt mychart msg with instructions as per pt request.

## 2023-10-12 ENCOUNTER — Ambulatory Visit (INDEPENDENT_AMBULATORY_CARE_PROVIDER_SITE_OTHER): Payer: Self-pay

## 2023-10-12 DIAGNOSIS — Z7901 Long term (current) use of anticoagulants: Secondary | ICD-10-CM | POA: Diagnosis not present

## 2023-10-12 LAB — POCT INR: INR: 3 (ref 2.0–3.0)

## 2023-10-12 NOTE — Patient Instructions (Addendum)
 Pre visit review using our clinic review tool, if applicable. No additional management support is needed unless otherwise documented below in the visit note.  Continue 1 tablet (6 mg) daily except take 1 1/2 tablets (9 mg) on Thursday. Recheck in 2 weeks.

## 2023-10-12 NOTE — Progress Notes (Signed)
 Pt tests at home. Result was placed on [employer] website by pt. INR was 3.0 Continue 1 tablet (6 mg) daily except take 1 1/2 tablets (9 mg) on Thursday. Recheck in 2 weeks.  Sent mychart msg with dosing and retest date.

## 2023-10-22 ENCOUNTER — Ambulatory Visit (INDEPENDENT_AMBULATORY_CARE_PROVIDER_SITE_OTHER): Admitting: Family Medicine

## 2023-10-22 ENCOUNTER — Other Ambulatory Visit (HOSPITAL_COMMUNITY): Payer: Self-pay

## 2023-10-22 ENCOUNTER — Encounter: Payer: Self-pay | Admitting: Pharmacist

## 2023-10-22 ENCOUNTER — Other Ambulatory Visit: Payer: Self-pay

## 2023-10-22 VITALS — BP 122/74 | HR 70 | Ht 69.0 in | Wt 175.0 lb

## 2023-10-22 DIAGNOSIS — M5441 Lumbago with sciatica, right side: Secondary | ICD-10-CM

## 2023-10-22 DIAGNOSIS — M5416 Radiculopathy, lumbar region: Secondary | ICD-10-CM | POA: Diagnosis not present

## 2023-10-22 DIAGNOSIS — G8929 Other chronic pain: Secondary | ICD-10-CM

## 2023-10-22 MED ORDER — PREDNISONE 20 MG PO TABS
20.0000 mg | ORAL_TABLET | Freq: Every day | ORAL | 0 refills | Status: DC
  Filled 2023-10-22 (×2): qty 5, 5d supply, fill #0

## 2023-10-22 NOTE — Patient Instructions (Addendum)
 [facility] [phone]  20mg  prednisone  for 5 days Consider medial branch blocks if it works,Neuro referral if it doesn't See you again in 6 weeks after injections

## 2023-10-22 NOTE — Progress Notes (Signed)
 [facility] [address] [place] [address] Phone: [phone] Subjective:   [name], am serving as a scribe for Dr. Ronnell Coins.  I'm seeing this patient by the request  of:  Luevenia Saha, MD  CC: Back pain  [id number]  02/28/2023 Do feel that still the epidural would be beneficial but we will move it to L5-S1.  Do feel that this would be an area where we do see some significant progression compared to the 2017 x-rays of the 2020 MRI.  Patient does also have what appears to be a fairly large sacral cyst that has potentially increase in size as well.  Past medical history is significant for antiphospholipid antibody, polycythemia as well as a smoker so if continuing to have discomfort and midline tenderness do feel that further evaluation with advanced imaging would be warranted.  Patient does have a very mild anterior change of the sacrum which would be concerning for potential stress fracture but highly unlikely.  Will see how patient responds with the injection if no significant improvement I do believe with patient's MRIs being greater than 13 years old and symptoms changed we should consider the possibility of advanced imaging.     Updated 10/22/2023 [name] is a 58 y.o. male coming in with complaint of back pain. Epidural 09/28/2023. Helped in one location, but other didn't work.       Past Medical History:  Diagnosis Date   Anticoagulated on Coumadin     Antiphospholipid antibody positive, followed by Dr. Salomon Cree, Hematology 06/27/2016   Anxiety with depression 06/24/2014   Chronic back pain    ED (erectile dysfunction)    GERD (gastroesophageal reflux disease)    Hemorrhoids, internal, with bleeding    Hiatal hernia    History of adenomatous polyp of colon, 07/08/16, tubular adenomas    History of atrial fibrillation, episode 08/2016    History of cardiomyopathy (HCC) 08/04/2018   ECHO 12/02/18  1. The left ventricle has normal  systolic function with an ejection fraction of 60-65%. The cavity size was normal. Left ventricular diastolic parameters were normal.  2. The right ventricle has normal systolic function. The cavity was normal. There is no increase in right ventricular wall thickness.  3. The mitral valve is grossly normal.  4. The aortic valve is tricuspid. 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 Could increase thrombotic risk especially if he has a polycythemic response.  If he decided to pursue this with an understanding of the risk - would use lowest dose of testosterone  to maintain level low normal 300-400.  2020: Secondary polycythemia. Not a candidate.   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), sober since 04/2016    S/P insertion of iliac artery stent 2018   02-23-2017  right CIA PTA and stent/05-11-2016 in-stent restenosis s/p thrombectomy/2018 thrombectomy and  patch angioplasty right  femoral artery    Past Surgical History:  Procedure Laterality Date   ABDOMINAL AORTOGRAM W/LOWER EXTREMITY N/A 10/19/2022   Procedure: ABDOMINAL AORTOGRAM W/LOWER EXTREMITY;  Surgeon: Young Hensen, MD;  Location: [facility] INVASIVE CV LAB;  Service: Cardiovascular;  Laterality: N/A;   ANGIOPLASTY ILLIAC ARTERY Right 07/17/2016   Procedure: ANGIOPLASTY RIGHT COMMON ILIAC ARTERY;  Surgeon: Arvil Lauber, MD;  Location: [facility] OR;  Service: Vascular;   Laterality: Right;   ANTERIOR CERVICAL DECOMP/DISCECTOMY FUSION  07/21/2010   C5 -- C7   CARDIOVASCULAR STRESS TEST  06-14-2016   dr hilty   normal perfusion study w/ no reversible ischemia/  stress ef 44% but visually looks better , echo ordered (LVEF 30-44%)  , normal LV wall motion    COLONOSCOPY N/A 07/08/2016   Procedure: COLONOSCOPY;  Surgeon: Ozell Blunt, MD;  Location: [facility] ENDOSCOPY;  Service: Endoscopy;  Laterality: N/A;   ESOPHAGOGASTRODUODENOSCOPY N/A 07/08/2016   Procedure: ESOPHAGOGASTRODUODENOSCOPY (EGD);  Surgeon: Ozell Blunt, MD;  Location: [facility];  Service: Endoscopy;  Laterality: N/A;   GROIN DEBRIDEMENT Right 07/25/2016   Procedure: EVACUATION HEMATOMA RIGHT GROIN;  Surgeon: Adine Hoof, MD;  Location: [facility] OR;  Service: Vascular;  Laterality: Right;   HEMORRHOID SURGERY N/A 12/07/2016   Procedure: 3 COLUMN HEMORRHOIDECTOMY;  Surgeon: Joyce Nixon, MD;  Location: [facility];  Service: General;  Laterality: N/A;   INTRAOPERATIVE ARTERIOGRAM Right 07/17/2016   Procedure: INTRA OPERATIVE ANGIOGRAM OF RIGHT COMMON ILIAC ARTERY;  Surgeon: Arvil Lauber, MD;  Location: [facility] OR;  Service: Vascular;  Laterality: Right;   KNEE SURGERY Right 1983   cartilage   PATCH ANGIOPLASTY Right 07/17/2016   Procedure: PATCH ANGIOPLASTY RIGHT FEMORAL ARTERY USING Corinna Dickens BIOLOGIC PATCH;  Surgeon: Arvil Lauber, MD;  Location: [facility] OR;  Service: Vascular;  Laterality: Right;   PERIPHERAL VASCULAR BALLOON ANGIOPLASTY  10/19/2022   Procedure: PERIPHERAL VASCULAR BALLOON ANGIOPLASTY;  Surgeon: Young Hensen, MD;  Location: [facility] INVASIVE CV LAB;  Service: Cardiovascular;;   PERIPHERAL VASCULAR CATHETERIZATION N/A 02/24/2016   Procedure: Abdominal Aortogram w/Lower Extremity;  Surgeon: Arvil Lauber, MD;  Location: [facility] INVASIVE CV LAB;  Service: Cardiovascular;  Laterality: N/A;   PERIPHERAL VASCULAR CATHETERIZATION Right 02/24/2016   Procedure: Peripheral Vascular Intervention;   Surgeon: Arvil Lauber, MD;  Location: [facility] INVASIVE CV LAB;  Service: Cardiovascular;  Laterality: Right;  Common iliac   PERIPHERAL VASCULAR CATHETERIZATION N/A 05/11/2016   Procedure: Abdominal Aortogram;  Surgeon: Arvil Lauber, MD;  Location: [facility] INVASIVE CV LAB;  Service: Cardiovascular;  Laterality: N/A;   PERIPHERAL VASCULAR CATHETERIZATION N/A 05/11/2016   Procedure: Lower Extremity Angiography;  Surgeon: Arvil Lauber, MD;  Location: [facility] INVASIVE CV LAB;  Service: Cardiovascular;  Laterality: N/A;   POSTERIOR CERVICAL FUSION/FORAMINOTOMY N/A 07/05/2018   Procedure: Posterior cervical fusion with lateral mass fixation - Cervical five-Cervical six, Cervical six-Cervical seven;  Surgeon: Agustina Aldrich, MD;  Location: [facility] OR;  Service: Neurosurgery;  Laterality: N/A;   THROMBECTOMY FEMORAL ARTERY Right 07/17/2016   Procedure: THROMBECTOMY RIGHT FEMORAL ARTERY;  Surgeon: Arvil Lauber, MD;  Location: [facility] OR;  Service: Vascular;  Laterality: Right;   TRANSTHORACIC ECHOCARDIOGRAM  06-20-2016   dr hilty   EF 55-60%,  grade 1 diastolic dysfunction, mild TR   Social History   Socioeconomic History   Marital status: Married    Spouse name: Concha Deed   Number of children: Not on file   Years of education: Not on file   Highest education  level: Not on file  Occupational History   Occupation: Disabled  Tobacco Use   Smoking status: Every Day    Current packs/day: 1.00    Average packs/day: 1 pack/day for 42.0 years (42.0 ttl pk-yrs)    Types: Cigarettes, E-cigarettes   Smokeless tobacco: Never  Vaping Use   Vaping status: Never Used  Substance and Sexual Activity   Alcohol use: No    Comment: Sober since 2017   Drug use: No   Sexual activity: Yes    Partners: Female  Other Topics Concern   Not on file  Social History Narrative   Not on file   Social Drivers of Health   Financial Resource Strain: Not on file  Food Insecurity: Not on file  Transportation Needs: Not on file  Physical Activity:  Not on file  Stress: Not on file  Social Connections: Not on file   Allergies  Allergen Reactions   Cymbalta  [Duloxetine  Hcl] Other (See Comments)    Headaches   Family History  Problem Relation Age of Onset   Lung cancer Father     Current Outpatient Medications (Endocrine & Metabolic):    predniSONE  (DELTASONE ) 20 MG tablet, Take 1 tablet (20 mg total) by mouth daily with breakfast.  Current Outpatient Medications (Cardiovascular):    carvedilol  (COREG ) 6.25 MG tablet, Take 1 tablet (6.25 mg total) by mouth 2 (two) times daily.   rosuvastatin  (CRESTOR ) 10 MG tablet, Take 1 tablet (10 mg total) by mouth daily.  Current Outpatient Medications (Respiratory):    Fluticasone -Umeclidin-Vilant (TRELEGY ELLIPTA ) 100-62.5-25 MCG/ACT AEPB, Inhale 1 puff into the lungs daily.   triamcinolone  (NASACORT  ALLERGY 24HR) 55 MCG/ACT AERO nasal inhaler, Place 1-2 sprays into the nose daily.   Current Outpatient Medications (Hematological):    enoxaparin  (LOVENOX ) 120 MG/0.8ML injection, Inject 0.8 mLs (120 mg total) into the skin daily. USE AS DIRECTED BY ANTICOAGULATION CLINIC   warfarin (COUMADIN ) 6 MG tablet, TAKE 1 TABLET BY MOUTH DAILY EXCEPT TAKE 1/2 TABLET ON MONDAYS AND THURSDAYS OR AS DIRECTED BY ANTICOAGULATION CLINIC  Current Outpatient Medications (Other):    Cholecalciferol (VITAMIN D3) 50 MCG CAPS, Take 2,000 Units by mouth daily at 12 noon.   cyclobenzaprine  (FLEXERIL ) 10 MG tablet, Take 1 tablet (10 mg total) by mouth at bedtime as needed for muscle spasms.   esomeprazole (NEXIUM) 20 MG capsule, Take 20 mg by mouth daily before lunch.   FLUoxetine  (PROZAC ) 20 MG capsule, Take 1 capsule (20 mg total) by mouth daily.   Reviewed prior external information including notes and imaging from  primary care provider As well as notes that were available from care everywhere and other healthcare systems.  Past medical history, social, surgical and family history all reviewed in  electronic medical record.  No pertanent information unless stated regarding to the chief complaint.   Review of Systems:  No headache, visual changes, nausea, vomiting, diarrhea, constipation, dizziness, abdominal pain, skin rash, fevers, chills, night sweats, weight loss, swollen lymph nodes, body aches, joint swelling, chest pain, shortness of breath, mood changes. POSITIVE muscle aches  Objective  Blood pressure 122/74, pulse 70, height 5\' 9"  (1.753 m), weight 175 lb (79.4 kg), SpO2 97%.   General: No apparent distress alert and oriented x3 mood and affect normal, dressed appropriately.  HEENT: Pupils equal, extraocular movements intact  Respiratory: Patient's speak in full sentences and does not appear short of breath  Cardiovascular: No lower extremity edema, non tender, no erythema  Low back exam shows some loss  of lordosis.  Even with standing patient states in more of a flexed position.  With significant difficulty even getting to 0 degrees of extension.  Deep tendon reflexes intact of the lower extremities.    Impression and Recommendations:    The above documentation has been reviewed and is accurate and complete [name] M Pal[place] Rogacki, DO

## 2023-10-22 NOTE — Assessment & Plan Note (Addendum)
 Patient likely is no longer having the radicular symptoms but now having pain that seems to be more of the facet joints of the L4-L5 and now more bilateral.  Worsening pain with extension of the back.  Worsening with standing for long amount of time and once again no significant radicular symptoms.  Seems to be different than his symptoms previously.  This is consistent with some of the findings on the MRI.  Depending on finding we will see how patient responds to potential facet injections.  Could be a candidate for medial branch block in radiofrequency ablation in the long run as well.  Will make no changes other medications and patient is on anticoagulation.  Short course of prednisone  9 mg daily for 5 days prescribed.

## 2023-10-23 ENCOUNTER — Ambulatory Visit (INDEPENDENT_AMBULATORY_CARE_PROVIDER_SITE_OTHER): Payer: Self-pay

## 2023-10-23 DIAGNOSIS — Z7901 Long term (current) use of anticoagulants: Secondary | ICD-10-CM

## 2023-10-23 LAB — POCT INR: INR: 2.8 (ref 2.0–3.0)

## 2023-10-23 NOTE — Patient Instructions (Addendum)
 Pre visit review using our clinic review tool, if applicable. No additional management support is needed unless otherwise documented below in the visit note.  Continue 1 tablet (6 mg) daily except take 1 1/2 tablets (9 mg) on Thursday. Recheck in 2 weeks.

## 2023-10-23 NOTE — Progress Notes (Signed)
 Pt tests at home. Result was placed on [employer] website by pt. INR was 2.8 Continue 1 tablet (6 mg) daily except take 1 1/2 tablets (9 mg) on Thursday. Recheck in 2 weeks.  Sent mychart msg with dosing and retest date.

## 2023-11-06 ENCOUNTER — Ambulatory Visit (INDEPENDENT_AMBULATORY_CARE_PROVIDER_SITE_OTHER)

## 2023-11-06 DIAGNOSIS — Z7901 Long term (current) use of anticoagulants: Secondary | ICD-10-CM | POA: Diagnosis not present

## 2023-11-06 LAB — POCT INR: INR: 2.9 (ref 2.0–3.0)

## 2023-11-06 NOTE — Patient Instructions (Addendum)
 Pre visit review using our clinic review tool, if applicable. No additional management support is needed unless otherwise documented below in the visit note.  Continue 1 tablet (6 mg) daily except take 1 1/2 tablets (9 mg) on Thursday. Recheck in 2 weeks.

## 2023-11-06 NOTE — Progress Notes (Signed)
 Pt tests at home. Result was placed on [employer] website by pt. INR was 2.9 Continue 1 tablet (6 mg) daily except take 1 1/2 tablets (9 mg) on Thursday. Recheck in 2 weeks.  Sent mychart msg with dosing and retest date.

## 2023-11-07 ENCOUNTER — Ambulatory Visit: Admitting: Family Medicine

## 2023-11-08 NOTE — Discharge Instructions (Signed)

## 2023-11-09 ENCOUNTER — Ambulatory Visit
Admission: RE | Admit: 2023-11-09 | Discharge: 2023-11-09 | Disposition: A | Discharge status: Home / Self Care | Source: Ambulatory Visit | Attending: Family Medicine | Admitting: Family Medicine

## 2023-11-09 DIAGNOSIS — M5416 Radiculopathy, lumbar region: Secondary | ICD-10-CM

## 2023-11-09 DIAGNOSIS — M47816 Spondylosis without myelopathy or radiculopathy, lumbar region: Secondary | ICD-10-CM | POA: Diagnosis not present

## 2023-11-09 MED ORDER — IOPAMIDOL (ISOVUE-M 200) INJECTION 41%
1.0000 mL | Freq: Once | INTRAMUSCULAR | Status: AC
  Administered 2023-11-09: 1 mL via INTRA_ARTICULAR

## 2023-11-09 MED ORDER — METHYLPREDNISOLONE ACETATE 40 MG/ML INJ SUSP (RADIOLOG
80.0000 mg | Freq: Once | INTRAMUSCULAR | Status: AC
  Administered 2023-11-09: 80 mg via INTRA_ARTICULAR

## 2023-11-11 ENCOUNTER — Other Ambulatory Visit: Payer: Self-pay | Admitting: Family Medicine

## 2023-11-13 ENCOUNTER — Other Ambulatory Visit (HOSPITAL_COMMUNITY): Payer: Self-pay

## 2023-11-13 MED ORDER — CYCLOBENZAPRINE HCL 10 MG PO TABS
10.0000 mg | ORAL_TABLET | Freq: Every evening | ORAL | 3 refills | Status: AC | PRN
  Filled 2023-11-13: qty 90, 90d supply, fill #0
  Filled 2024-05-01: qty 90, 90d supply, fill #1

## 2023-11-14 ENCOUNTER — Other Ambulatory Visit: Payer: Self-pay

## 2023-11-14 ENCOUNTER — Other Ambulatory Visit (HOSPITAL_COMMUNITY): Payer: Self-pay

## 2023-11-14 ENCOUNTER — Encounter: Payer: Commercial Managed Care - PPO | Admitting: Family Medicine

## 2023-11-15 ENCOUNTER — Ambulatory Visit: Admitting: Family Medicine

## 2023-11-19 NOTE — Progress Notes (Unsigned)
 [name] [address] [place] [address] Phone: [phone] Subjective:   [name], am serving as a scribe for Dr. Arthea Claudene.  I'm seeing this patient by the request  of:  Jodie Lavern CROME, MD  CC: Right leg pain and back pain  YEP:Dlagzrupcz  10/22/2023 Patient likely is no longer having the radicular symptoms but now having pain that seems to be more of the facet joints of the L4-L5 and now more bilateral.  Worsening pain with extension of the back.  Worsening with standing for long amount of time and once again no significant radicular symptoms.  Seems to be different than his symptoms previously.  This is consistent with some of the findings on the MRI.  Depending on finding we will see how patient responds to potential facet injections.  Could be a candidate for medial branch block in radiofrequency ablation in the long run as well.  Will make no changes other medications and patient is on anticoagulation.  Short course of prednisone  9 mg daily for 5 days prescribed.     Updated 11/21/2023 [name] is a 58 y.o. male coming in with complaint of R sciatic pain. Patient states that his back is somewhat better. Patient having squeezing pain in quad, lateral hip and glute on R side. Symptoms presented one week after last visit. Relieving position is to be in fetal position in his recliner.   Facet injections were helpful but pain is returning due to having to be in fetal position.        Past Medical History:  Diagnosis Date   Anticoagulated on Coumadin     Antiphospholipid antibody positive, followed by Dr. Onesimo, Hematology 06/27/2016   Anxiety with depression 06/24/2014   Chronic back pain    ED (erectile dysfunction)    GERD (gastroesophageal reflux disease)    Hemorrhoids, internal, with bleeding    Hiatal hernia    History of adenomatous polyp of colon, 07/08/16, tubular adenomas    History of atrial fibrillation, episode  08/2016    History of cardiomyopathy (HCC) 08/04/2018   ECHO 12/02/18  1. The left ventricle has normal systolic function with an ejection fraction of 60-65%. The cavity size was normal. Left ventricular diastolic parameters were normal.  2. The right ventricle has normal systolic function. The cavity was normal. There is no increase in right ventricular wall thickness.  3. The mitral valve is grossly normal.  4. The aortic valve is tricuspid. 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 Could increase thrombotic risk especially if he has a polycythemic response.  If he decided to pursue this with an understanding of the risk - would use lowest dose of testosterone  to maintain level low normal 300-400.  2020: Secondary polycythemia. Not a candidate.   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[facility]), sober since 04/2016  S/P insertion of iliac artery stent 2018   02-23-2017  right CIA PTA and stent/05-11-2016 in-stent restenosis s/p thrombectomy/2018 thrombectomy and  patch angioplasty right femoral artery    Past Surgical History:  Procedure Laterality Date   ABDOMINAL AORTOGRAM W/LOWER EXTREMITY N/A 10/19/2022   Procedure: ABDOMINAL AORTOGRAM W/LOWER EXTREMITY;  Surgeon: Gretta Lonni PARAS, MD;  Location: [facility];  Service: Cardiovascular;  Laterality: N/A;   ANGIOPLASTY ILLIAC ARTERY Right 07/17/2016   Procedure:  ANGIOPLASTY RIGHT COMMON ILIAC ARTERY;  Surgeon: Redell LITTIE Door, MD;  Location: [facility] OR;  Service: Vascular;  Laterality: Right;   ANTERIOR CERVICAL DECOMP/DISCECTOMY FUSION  07/21/2010   C5 -- C7   CARDIOVASCULAR STRESS TEST  06-14-2016   dr hilty   normal perfusion study w/ no reversible ischemia/  stress ef 44% but visually looks better , echo ordered (LVEF 30-44%)  , normal LV wall motion    COLONOSCOPY N/A 07/08/2016   Procedure: COLONOSCOPY;  Surgeon: Oliva Boots, MD;  Location: [facility];  Service: Endoscopy;  Laterality: N/A;   ESOPHAGOGASTRODUODENOSCOPY N/A 07/08/2016   Procedure: ESOPHAGOGASTRODUODENOSCOPY (EGD);  Surgeon: Oliva Boots, MD;  Location: [facility];  Service: Endoscopy;  Laterality: N/A;   GROIN DEBRIDEMENT Right 07/25/2016   Procedure: EVACUATION HEMATOMA RIGHT GROIN;  Surgeon: Penne Lonni Colorado, MD;  Location: [facility] OR;  Service: Vascular;  Laterality: Right;   HEMORRHOID SURGERY N/A 12/07/2016   Procedure: 3 COLUMN HEMORRHOIDECTOMY;  Surgeon: Debby Hila, MD;  Location[facility];  Service: General;  Laterality: N/A;   INTRAOPERATIVE ARTERIOGRAM Right 07/17/2016   Procedure: INTRA OPERATIVE ANGIOGRAM OF RIGHT COMMON ILIAC ARTERY;  Surgeon: Redell LITTIE Door, MD;  Location: [facility] OR;  Service: Vascular;  Laterality: Right;   KNEE SURGERY Right 1983   cartilage   PATCH ANGIOPLASTY Right 07/17/2016   Procedure: PATCH ANGIOPLASTY RIGHT FEMORAL ARTERY USING GEORGE BIOLOGIC PATCH;  Surgeon: Redell LITTIE Door, MD;  Location: [facility] OR;  Service: Vascular;  Laterality: Right;   PERIPHERAL VASCULAR BALLOON ANGIOPLASTY  10/19/2022   Procedure: PERIPHERAL VASCULAR BALLOON ANGIOPLASTY;  Surgeon: Gretta Lonni PARAS, MD;  Location: [facility];  Service: Cardiovascular;;   PERIPHERAL VASCULAR CATHETERIZATION N/A 02/24/2016   Procedure: Abdominal Aortogram w/Lower Extremity;  Surgeon: Redell LITTIE Door, MD;  Location: [facility] INVASIVE CV LAB;  Service: Cardiovascular;  Laterality: N/A;    PERIPHERAL VASCULAR CATHETERIZATION Right 02/24/2016   Procedure: Peripheral Vascular Intervention;  Surgeon: Redell LITTIE Door, MD;  Location: [facility] INVASIVE CV LAB;  Service: Cardiovascular;  Laterality: Right;  Common iliac   PERIPHERAL VASCULAR CATHETERIZATION N/A 05/11/2016   Procedure: Abdominal Aortogram;  Surgeon: Redell LITTIE Door, MD;  Location: [facility] INVASIVE CV LAB;  Service: Cardiovascular;  Laterality: N/A;   PERIPHERAL VASCULAR CATHETERIZATION N/A 05/11/2016   Procedure: Lower Extremity Angiography;  Surgeon: Redell LITTIE Door, MD;  Location: [facility] INVASIVE CV LAB;  Service: Cardiovascular;  Laterality: N/A;   POSTERIOR CERVICAL FUSION/FORAMINOTOMY N/A 07/05/2018   Procedure: Posterior cervical fusion with lateral mass fixation - Cervical five-Cervical six, Cervical six-Cervical seven;  Surgeon: Louis Shove, MD;  Location: [facility] OR;  Service: Neurosurgery;  Laterality: N/A;   THROMBECTOMY FEMORAL ARTERY Right 07/17/2016   Procedure: THROMBECTOMY RIGHT FEMORAL ARTERY;  Surgeon: Redell LITTIE Door, MD;  Location: [facility] OR;  Service: Vascular;  Laterality: Right;   TRANSTHORACIC ECHOCARDIOGRAM  06-20-2016   dr hilty   EF 55-60%,  grade 1 diastolic dysfunction, mild TR   Social History   Socioeconomic History   Marital status: Married  Spouse name: Mliss   Number of children: Not on file   Years of education: Not on file   Highest education level: Not on file  Occupational History   Occupation: Disabled  Tobacco Use   Smoking status: Every Day    Current packs/day: 1.00    Average packs/day: 1 pack/day for 42.0 years (42.0 ttl pk-yrs)    Types: Cigarettes, E-cigarettes   Smokeless tobacco: Never  Vaping Use   Vaping status: Never Used  Substance and Sexual Activity   Alcohol use: No    Comment: Sober since 2017   Drug use: No   Sexual activity: Yes    Partners: Female  Other Topics Concern   Not on file  Social History Narrative   Not on file   Social Drivers of Health   Financial Resource  Strain: Not on file  Food Insecurity: Not on file  Transportation Needs: Not on file  Physical Activity: Not on file  Stress: Not on file  Social Connections: Not on file   Allergies  Allergen Reactions   Cymbalta  [Duloxetine  Hcl] Other (See Comments)    Headaches   Family History  Problem Relation Age of Onset   Lung cancer Father     Current Outpatient Medications (Endocrine & Metabolic):    predniSONE  (DELTASONE ) 20 MG tablet, Take 1 tablet (20 mg total) by mouth daily with breakfast.  Current Outpatient Medications (Cardiovascular):    carvedilol  (COREG ) 6.25 MG tablet, Take 1 tablet (6.25 mg total) by mouth 2 (two) times daily.   rosuvastatin  (CRESTOR ) 10 MG tablet, Take 1 tablet (10 mg total) by mouth daily.  Current Outpatient Medications (Respiratory):    Fluticasone -Umeclidin-Vilant (TRELEGY ELLIPTA ) 100-62.5-25 MCG/ACT AEPB, Inhale 1 puff into the lungs daily.   triamcinolone  (NASACORT  ALLERGY 24HR) 55 MCG/ACT AERO nasal inhaler, Place 1-2 sprays into the nose daily.   Current Outpatient Medications (Hematological):    warfarin (COUMADIN ) 6 MG tablet, TAKE 1 TABLET BY MOUTH DAILY EXCEPT TAKE 1/2 TABLET ON MONDAYS AND THURSDAYS OR AS DIRECTED BY ANTICOAGULATION CLINIC   enoxaparin  (LOVENOX ) 120 MG/0.8ML injection, Inject 0.8 mLs (120 mg total) into the skin daily. USE AS DIRECTED BY ANTICOAGULATION CLINIC  Current Outpatient Medications (Other):    Cholecalciferol (VITAMIN D3) 50 MCG CAPS, Take 2,000 Units by mouth daily at 12 noon.   cyclobenzaprine  (FLEXERIL ) 10 MG tablet, Take 1 tablet (10 mg total) by mouth at bedtime as needed for muscle spasms.   esomeprazole (NEXIUM) 20 MG capsule, Take 20 mg by mouth daily before lunch.   FLUoxetine  (PROZAC ) 20 MG capsule, Take 1 capsule (20 mg total) by mouth daily.   gabapentin  (NEURONTIN ) 100 MG capsule, Take 2 capsules (200 mg total) by mouth at bedtime.   Reviewed prior external information including notes and imaging  from  primary care provider As well as notes that were available from care everywhere and other healthcare systems.  Past medical history, social, surgical and family history all reviewed in electronic medical record.  No pertanent information unless stated regarding to the chief complaint.   Review of Systems:  No headache, visual changes, nausea, vomiting, diarrhea, constipation, dizziness, abdominal pain, skin rash, fevers, chills, night sweats, weight loss, swollen lymph nodes, body aches, joint swelling, chest pain, shortness of breath, mood changes. POSITIVE muscle aches  Objective  Blood pressure (!) 144/102, pulse 75, height 5' 9 (1.753 m), weight 175 lb (79.4 kg), SpO2 96%.   General: No apparent distress alert and oriented x3 mood and affect  normal, dressed appropriately.  HEENT: Pupils equal, extraocular movements intact  Respiratory: Patient's speak in full sentences and does not appear short of breath  Cardiovascular: No lower extremity edema, non tender, no erythema  Antalgic gait noted.  Favoring the right side of his back.  No CVA tenderness.  Patient is having tightness noted in multiple different areas.  No specific findings of the hip but mild positive fulcrum test noted. Does have a mild positive Trendelenburg on the right leg as well.  Seems to be neurovascularly intact distally.  Negative straight leg test but worsening pain with extension of the hip greater than 5 degrees of midline.   Impression and Recommendations:    The above documentation has been reviewed and is accurate and complete Dailey Buccheri M Parke Jandreau, DO

## 2023-11-20 ENCOUNTER — Ambulatory Visit (INDEPENDENT_AMBULATORY_CARE_PROVIDER_SITE_OTHER): Payer: Self-pay

## 2023-11-20 ENCOUNTER — Other Ambulatory Visit (HOSPITAL_COMMUNITY): Payer: Self-pay

## 2023-11-20 DIAGNOSIS — Z7901 Long term (current) use of anticoagulants: Secondary | ICD-10-CM | POA: Diagnosis not present

## 2023-11-20 DIAGNOSIS — D6861 Antiphospholipid syndrome: Secondary | ICD-10-CM | POA: Diagnosis not present

## 2023-11-20 LAB — POCT INR: INR: 3.1 — AB (ref 2.0–3.0)

## 2023-11-20 NOTE — Progress Notes (Signed)
 Pt tests at home. Result was placed on [employer] website by pt. INR was 3.1 Either eat some greens/salad today or reduce dose today to take 1/2 tablet and then continue 1 tablet (6 mg) daily except take 1 1/2 tablets (9 mg) on Thursday. Recheck in 2 weeks.  Sent mychart msg with dosing and retest date.

## 2023-11-20 NOTE — Patient Instructions (Addendum)
 Pre visit review using our clinic review tool, if applicable. No additional management support is needed unless otherwise documented below in the visit note.  Either eat some greens/salad today or reduce dose today to take 1/2 tablet and then continue 1 tablet (6 mg) daily except take 1 1/2 tablets (9 mg) on Thursday. Recheck in 2 weeks.

## 2023-11-21 ENCOUNTER — Ambulatory Visit (INDEPENDENT_AMBULATORY_CARE_PROVIDER_SITE_OTHER): Admitting: Family Medicine

## 2023-11-21 ENCOUNTER — Ambulatory Visit (INDEPENDENT_AMBULATORY_CARE_PROVIDER_SITE_OTHER)

## 2023-11-21 ENCOUNTER — Other Ambulatory Visit (HOSPITAL_COMMUNITY): Payer: Self-pay

## 2023-11-21 ENCOUNTER — Encounter: Payer: Self-pay | Admitting: Family Medicine

## 2023-11-21 VITALS — BP 144/102 | HR 75 | Ht 69.0 in | Wt 175.0 lb

## 2023-11-21 DIAGNOSIS — M5441 Lumbago with sciatica, right side: Secondary | ICD-10-CM | POA: Diagnosis not present

## 2023-11-21 DIAGNOSIS — M25551 Pain in right hip: Secondary | ICD-10-CM | POA: Diagnosis not present

## 2023-11-21 DIAGNOSIS — G8929 Other chronic pain: Secondary | ICD-10-CM | POA: Diagnosis not present

## 2023-11-21 MED ORDER — KETOROLAC TROMETHAMINE 30 MG/ML IJ SOLN
30.0000 mg | Freq: Once | INTRAMUSCULAR | Status: AC
Start: 2023-11-21 — End: 2023-11-21
  Administered 2023-11-21: 30 mg via INTRAMUSCULAR

## 2023-11-21 MED ORDER — METHYLPREDNISOLONE ACETATE 40 MG/ML IJ SUSP
40.0000 mg | Freq: Once | INTRAMUSCULAR | Status: AC
  Administered 2023-11-21: 40 mg via INTRAMUSCULAR

## 2023-11-21 MED ORDER — GABAPENTIN 100 MG PO CAPS
200.0000 mg | ORAL_CAPSULE | Freq: Every day | ORAL | 0 refills | Status: DC
  Filled 2023-11-21 – 2023-11-22 (×2): qty 180, 90d supply, fill #0

## 2023-11-21 NOTE — Assessment & Plan Note (Addendum)
 Severe right hip pain that seems to be worse with weightbearing but better with a flexion of the hip.  Seems to be more in the posterior aspect but radiates to the anterior lateral aspect of the leg.  Patient's pain is quite severe and occult fracture is a possibility.  Has had significant difficulty though also with polycythemia as well as antiphospholipid antibody and is on a blood thinner.  Patient has also a smoker still.  These increase the risk of other pathologies in the vascular compromise that could also be affecting this.  Does get more of this pain even with straightening on his back.  At this point I do feel an MRI with contrast will be beneficial.  The x-rays are pending.  If worsening pain to seek medical attention, Toradol  and Depo-Medrol  injection given today.  Patient was going to hold his Coumadin  for 1 day.

## 2023-11-21 NOTE — Patient Instructions (Addendum)
 Xray today Half cocktail today.  [facility] [phone] Call Today  When we receive your results we will contact you.

## 2023-11-21 NOTE — Assessment & Plan Note (Signed)
 Patient is having low back pain but did not get any relief with the facet injections.  Still was not responding extremely well to the other injections either.  Patient was having more right hip pain it seems.  Has had vascular difficulties previously.  Discussed with patient and significant other about what to do.  Patient does have pain that is out of proportion that did not even allow him to stand for 20 seconds at a time over the weekend and has had some increasing groin pain.  Differential is quite broad and occult fracture is a possibility.  Discussed icing regimen and home exercises, increase activity slowly.  Follow-up with me again in 6 to 8 weeks otherwise.

## 2023-11-22 ENCOUNTER — Other Ambulatory Visit (HOSPITAL_COMMUNITY): Payer: Self-pay

## 2023-11-25 ENCOUNTER — Ambulatory Visit: Payer: Self-pay | Admitting: Family Medicine

## 2023-12-03 ENCOUNTER — Ambulatory Visit

## 2023-12-03 DIAGNOSIS — M25551 Pain in right hip: Secondary | ICD-10-CM

## 2023-12-03 DIAGNOSIS — M51369 Other intervertebral disc degeneration, lumbar region without mention of lumbar back pain or lower extremity pain: Secondary | ICD-10-CM | POA: Diagnosis not present

## 2023-12-03 DIAGNOSIS — M2548 Effusion, other site: Secondary | ICD-10-CM | POA: Diagnosis not present

## 2023-12-03 DIAGNOSIS — M47816 Spondylosis without myelopathy or radiculopathy, lumbar region: Secondary | ICD-10-CM | POA: Diagnosis not present

## 2023-12-03 MED ORDER — GADOBUTROL 1 MMOL/ML IV SOLN
8.0000 mL | Freq: Once | INTRAVENOUS | Status: AC | PRN
  Administered 2023-12-03: 8 mL via INTRAVENOUS

## 2023-12-04 ENCOUNTER — Other Ambulatory Visit: Payer: Self-pay

## 2023-12-04 ENCOUNTER — Ambulatory Visit (INDEPENDENT_AMBULATORY_CARE_PROVIDER_SITE_OTHER)

## 2023-12-04 DIAGNOSIS — I779 Disorder of arteries and arterioles, unspecified: Secondary | ICD-10-CM

## 2023-12-04 DIAGNOSIS — Z7901 Long term (current) use of anticoagulants: Secondary | ICD-10-CM | POA: Diagnosis not present

## 2023-12-04 LAB — POCT INR: INR: 2.6 (ref 2.0–3.0)

## 2023-12-04 NOTE — Patient Instructions (Addendum)
 Pre visit review using our clinic review tool, if applicable. No additional management support is needed unless otherwise documented below in the visit note.  Continue 1 tablet (6 mg) daily except take 1 1/2 tablets (9 mg) on Thursday. Recheck in 2 weeks.

## 2023-12-04 NOTE — Progress Notes (Signed)
 Pt tests at home. Result was placed on [employer] website by pt. INR was 2.6 Pt had sports med apt on 7/2 and was given depo-medrol  injection in hip.  Continue 1 tablet (6 mg) daily except take 1 1/2 tablets (9 mg) on Thursday. Recheck in 2 weeks.  Sent mychart msg with dosing and retest date.

## 2023-12-06 ENCOUNTER — Telehealth: Payer: Self-pay | Admitting: Family Medicine

## 2023-12-06 ENCOUNTER — Other Ambulatory Visit: Payer: Self-pay

## 2023-12-06 ENCOUNTER — Encounter: Admitting: Family Medicine

## 2023-12-06 DIAGNOSIS — M47816 Spondylosis without myelopathy or radiculopathy, lumbar region: Secondary | ICD-10-CM

## 2023-12-06 NOTE — Telephone Encounter (Signed)
Injections ordered. Patient notified.

## 2023-12-06 NOTE — Telephone Encounter (Signed)
 Patient called in regards to his recent MRI. He would like to proceed with the facet injections mentioned. Can these be ordered for him?

## 2023-12-10 ENCOUNTER — Other Ambulatory Visit: Payer: Self-pay | Admitting: *Deleted

## 2023-12-10 ENCOUNTER — Other Ambulatory Visit (HOSPITAL_COMMUNITY): Payer: Self-pay

## 2023-12-10 ENCOUNTER — Ambulatory Visit
Admission: RE | Admit: 2023-12-10 | Discharge: 2023-12-10 | Disposition: A | Discharge status: Home / Self Care | Source: Ambulatory Visit | Attending: Family Medicine | Admitting: Family Medicine

## 2023-12-10 ENCOUNTER — Other Ambulatory Visit

## 2023-12-10 DIAGNOSIS — M545 Low back pain, unspecified: Secondary | ICD-10-CM

## 2023-12-10 DIAGNOSIS — M47816 Spondylosis without myelopathy or radiculopathy, lumbar region: Secondary | ICD-10-CM

## 2023-12-10 MED ORDER — IOPAMIDOL (ISOVUE-M 300) INJECTION 61%: 1.0000 mL | Freq: Once | INTRAMUSCULAR | Status: DC

## 2023-12-10 MED ORDER — METHYLPREDNISOLONE ACETATE 40 MG/ML INJ SUSP (RADIOLOG: 80.0000 mg | Freq: Once | INTRAMUSCULAR | Status: DC

## 2023-12-12 ENCOUNTER — Encounter: Payer: Commercial Managed Care - PPO | Admitting: Family Medicine

## 2023-12-13 ENCOUNTER — Telehealth: Payer: Self-pay

## 2023-12-13 NOTE — Telephone Encounter (Signed)
 I am okay with this-patient will need Lovenox  bridge set up through Coumadin  clinic as per previous injections

## 2023-12-13 NOTE — Telephone Encounter (Signed)
 Form given to Dr. Katrinka in Dr. Javan absence per [facility].  Copied from CRM [id number]. Topic: Clinical - Medical Advice >> Dec 13, 2023  8:50 AM Franky GRADE wrote: Reason for CRM: [facility] is calling to see if documents that were faxed on 12/10/2023 were received by the office and turnaround time on when they should be expecting it back. Patient is up for an injections but needs to hold off on coumadin  medication. Best call [phone].

## 2023-12-14 NOTE — Telephone Encounter (Signed)
 Pt has been scheduled for lumbar epidural on 8/11. Pt will be placed on a Lovenox  bridge.  Current wt: 79.4 kg CrCl: 104.08 mL/min  Current warfarin dose; 6 mg (1 tablet) daily except take 9 mg (1 1/2 tablets) on Thursday  Lovenox  injections, 120 mg once daily in the AM  Bridge Instructions:  8/6: Take last dose of warfarin 8/7: NO warfarin, NO Lovenox  8/8: NO warfarin, inject Lovenox  in the AM 8/9: NO warfarin, inject Lovenox  in the AM 8/10: NO warfarin, inject Lovenox  in the AM (BEFORE 7 AM)  8/11: PROCEDURE; NO WARFARIN, NO LOVENOX   8/12: Take 1 1/2 tablets (9 mg) warfarin, inject Lovenox  once in the AM 8/13: Take 1 1/2 tablets (9 mg) warfarin, inject Lovenox  once in the AM 8/14: Take 2 tablets (12 mg) warfarin, inject Lovenox  once in the AM 8/15: Take 1 tablet (6 mg) warfarin, inject Lovenox  once in the AM 8/16: Take 1 tablet (6 mg) warfarin, inject Lovenox  once in the AM 8/17: Take 1 tablet (6 mg) warfarin, inject Lovenox  once in the AM 8/18: Recheck INR; NO WARFARIN AND NO LOVENOX  UNTIL AFTER INR CHECK

## 2023-12-14 NOTE — Telephone Encounter (Signed)
 Faxed blood thinner clearance paperwork to [facility] 12/14/2023.

## 2023-12-17 ENCOUNTER — Other Ambulatory Visit (HOSPITAL_COMMUNITY): Payer: Self-pay

## 2023-12-17 MED ORDER — ENOXAPARIN SODIUM 120 MG/0.8ML IJ SOSY
120.0000 mg | PREFILLED_SYRINGE | INTRAMUSCULAR | 0 refills | Status: DC
  Filled 2023-12-17: qty 7.2, 9d supply, fill #0

## 2023-12-17 NOTE — Addendum Note (Signed)
 Addended by: ETHYL KIRSCH A on: 12/17/2023 03:00 PM   Modules accepted: Orders

## 2023-12-17 NOTE — Telephone Encounter (Signed)
Sent in Lovenox prescription.

## 2023-12-18 ENCOUNTER — Ambulatory Visit (INDEPENDENT_AMBULATORY_CARE_PROVIDER_SITE_OTHER): Admitting: Vascular Surgery

## 2023-12-18 ENCOUNTER — Ambulatory Visit (HOSPITAL_BASED_OUTPATIENT_CLINIC_OR_DEPARTMENT_OTHER)
Admission: RE | Admit: 2023-12-18 | Discharge: 2023-12-18 | Disposition: A | Discharge status: Home / Self Care | Source: Ambulatory Visit | Attending: Vascular Surgery | Admitting: Vascular Surgery

## 2023-12-18 ENCOUNTER — Ambulatory Visit (INDEPENDENT_AMBULATORY_CARE_PROVIDER_SITE_OTHER)

## 2023-12-18 ENCOUNTER — Encounter: Payer: Self-pay | Admitting: Vascular Surgery

## 2023-12-18 ENCOUNTER — Ambulatory Visit (HOSPITAL_COMMUNITY)
Admission: RE | Admit: 2023-12-18 | Discharge: 2023-12-18 | Disposition: A | Discharge status: Home / Self Care | Source: Ambulatory Visit | Attending: Vascular Surgery | Admitting: Vascular Surgery

## 2023-12-18 VITALS — BP 144/93 | HR 78 | Temp 98.2°F | Resp 18 | Ht 69.0 in | Wt 172.9 lb

## 2023-12-18 DIAGNOSIS — I779 Disorder of arteries and arterioles, unspecified: Secondary | ICD-10-CM | POA: Insufficient documentation

## 2023-12-18 DIAGNOSIS — Z7901 Long term (current) use of anticoagulants: Secondary | ICD-10-CM | POA: Diagnosis not present

## 2023-12-18 LAB — VAS US ABI WITH/WO TBI
Left ABI: 1.11
Right ABI: 1.03

## 2023-12-18 LAB — POCT INR: INR: 2.8 (ref 2.0–3.0)

## 2023-12-18 NOTE — Progress Notes (Signed)
 Patient name: [name] MRN: [id number] DOB: Nov 06, 1965 Sex: male  REASON FOR CONSULT: 1 year follow-up   HPI: [name] is a 58 y.o. male, with multiple medical problems including antiphospholipid antibody syndrome on Coumadin  that presents for 1 year follow-up after previous right common iliac artery drug-coated balloon angioplasty for high-grade in-stent stenosis.  On 10/19/2022 he underwent DCB of the right common iliac stent that had a high-grade stenosis with a velocity of 323.    Previously had stent placed in 2017 by Dr. Laurence for claudication in the right common iliac artery.  He later underwent a right common iliac artery thrombectomy with right common femoral patch angioplasty in 2018 by Dr. Laurence.   No new concerns today.  Overall leg is doing well.   Past Medical History:  Diagnosis Date   Anticoagulated on Coumadin     Antiphospholipid antibody positive, followed by Dr. Onesimo, Hematology 06/27/2016   Anxiety with depression 06/24/2014   Chronic back pain    ED (erectile dysfunction)    GERD (gastroesophageal reflux disease)    Hemorrhoids, internal, with bleeding    Hiatal hernia    History of adenomatous polyp of colon, 07/08/16, tubular adenomas    History of atrial fibrillation, episode 08/2016    History of cardiomyopathy (HCC) 08/04/2018   ECHO 12/02/18  1. The left ventricle has normal systolic function with an ejection fraction of 60-65%. The cavity size was normal. Left ventricular diastolic parameters were normal.  2. The right ventricle has normal systolic function. The cavity was normal. There is no increase in right ventricular wall thickness.  3. The mitral valve is grossly normal.  4. The aortic valve is tricuspid. Mild thic   History of DVT of lower extremity    Hyperlipidemia    Iron  deficiency anemia due to chronic blood loss, thought to be from hemorrhoids while on coumadin , s/p iron  infusion and blood transfusion x 2, 2018 2018   Low testosterone  in  male 05/04/2017   2018, 2019: Decision would be risk vs benefit. Could increase thrombotic risk especially if he has a polycythemic response.  If he decided to pursue this with an understanding of the risk - would use lowest dose of testosterone  to maintain level low normal 300-400.  2020: Secondary polycythemia. Not a candidate.   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[facility]), sober since 04/2016    S/P insertion of iliac artery stent 2018   02-23-2017  right CIA PTA and stent/05-11-2016 in-stent restenosis s/p thrombectomy/2018 thrombectomy and  patch angioplasty right femoral artery     Past Surgical History:  Procedure Laterality Date   ABDOMINAL AORTOGRAM W/LOWER EXTREMITY N/A 10/19/2022   Procedure: ABDOMINAL AORTOGRAM W/LOWER EXTREMITY;  Surgeon: Gretta Lonni PARAS, MD;  Location: [facility] INVASIVE CV LAB;  Service: Cardiovascular;  Laterality: N/A;   ANGIOPLASTY ILLIAC ARTERY Right 07/17/2016   Procedure: ANGIOPLASTY RIGHT COMMON ILIAC ARTERY;  Surgeon: Redell LITTIE Laurence, MD;  Location: [facility] OR;  Service: Vascular;  Laterality: Right;   ANTERIOR CERVICAL DECOMP/DISCECTOMY FUSION  07/21/2010   C5 -- C7   CARDIOVASCULAR STRESS TEST  06-14-2016   dr hilty   normal perfusion study w/ no reversible ischemia/  stress ef 44% but visually looks better , echo ordered (LVEF 30-44%)  , normal LV wall motion    COLONOSCOPY N/A 07/08/2016   Procedure: COLONOSCOPY;  Surgeon: Oliva Boots, MD;  Location: [facility] ENDOSCOPY;  Service: Endoscopy;  Laterality: N/A;   ESOPHAGOGASTRODUODENOSCOPY N/A 07/08/2016   Procedure: ESOPHAGOGASTRODUODENOSCOPY (EGD);  Surgeon: Oliva Boots, MD;  Location: [facility];  Service:  Endoscopy;  Laterality: N/A;   GROIN DEBRIDEMENT Right 07/25/2016   Procedure: EVACUATION HEMATOMA RIGHT GROIN;  Surgeon: Penne Lonni Colorado, MD;  Location: [facility] OR;  Service: Vascular;  Laterality: Right;   HEMORRHOID SURGERY N/A 12/07/2016   Procedure: 3 COLUMN HEMORRHOIDECTOMY;  Surgeon: Debby Hila, MD;  Location: [facility];  Service: General;  Laterality: N/A;   INTRAOPERATIVE ARTERIOGRAM Right 07/17/2016   Procedure: INTRA OPERATIVE ANGIOGRAM OF RIGHT COMMON ILIAC ARTERY;  Surgeon: Redell LITTIE Door, MD;  Location: [facility] OR;  Service: Vascular;  Laterality: Right;   KNEE SURGERY Right 1983   cartilage   PATCH ANGIOPLASTY Right 07/17/2016   Procedure: PATCH ANGIOPLASTY RIGHT FEMORAL ARTERY USING GEORGE BIOLOGIC PATCH;  Surgeon: Redell LITTIE Door, MD;  Location: [facility] OR;  Service: Vascular;  Laterality: Right;   PERIPHERAL VASCULAR BALLOON ANGIOPLASTY  10/19/2022   Procedure: PERIPHERAL VASCULAR BALLOON ANGIOPLASTY;  Surgeon: Gretta Lonni PARAS, MD;  Location: [facility] INVASIVE CV LAB;  Service: Cardiovascular;;   PERIPHERAL VASCULAR CATHETERIZATION N/A 02/24/2016   Procedure: Abdominal Aortogram w/Lower Extremity;  Surgeon: Redell LITTIE Door, MD;  Location: [facility] INVASIVE CV LAB;  Service: Cardiovascular;  Laterality: N/A;   PERIPHERAL VASCULAR CATHETERIZATION Right 02/24/2016   Procedure: Peripheral Vascular Intervention;  Surgeon: Redell LITTIE Door, MD;  Location: [facility] INVASIVE CV LAB;  Service: Cardiovascular;  Laterality: Right;  Common iliac   PERIPHERAL VASCULAR CATHETERIZATION N/A 05/11/2016   Procedure: Abdominal Aortogram;  Surgeon: Redell LITTIE Door, MD;  Location: [facility] INVASIVE CV LAB;  Service: Cardiovascular;  Laterality: N/A;   PERIPHERAL VASCULAR CATHETERIZATION N/A 05/11/2016   Procedure: Lower Extremity Angiography;  Surgeon: Redell LITTIE Door, MD;  Location: [facility] INVASIVE CV LAB;  Service: Cardiovascular;  Laterality: N/A;   POSTERIOR CERVICAL FUSION/FORAMINOTOMY N/A 07/05/2018   Procedure: Posterior  cervical fusion with lateral mass fixation - Cervical five-Cervical six, Cervical six-Cervical seven;  Surgeon: Louis Shove, MD;  Location: [facility] OR;  Service: Neurosurgery;  Laterality: N/A;   THROMBECTOMY FEMORAL ARTERY Right 07/17/2016   Procedure: THROMBECTOMY RIGHT FEMORAL ARTERY;  Surgeon: Redell LITTIE Door, MD;  Location: [facility] OR;  Service: Vascular;  Laterality: Right;   TRANSTHORACIC ECHOCARDIOGRAM  06-20-2016   dr hilty   EF 55-60%,  grade 1 diastolic dysfunction, mild TR    Family History  Problem Relation Age of Onset   Lung cancer Father     SOCIAL HISTORY: Social History   Socioeconomic History   Marital status: Married    Spouse name: Mliss   Number of children: Not on file   Years of education: Not on file   Highest education level: Not on file  Occupational History   Occupation: Disabled  Tobacco Use   Smoking status: Every Day    Current packs/day: 1.00    Average packs/day: 1 pack/day for 42.0 years (42.0 ttl pk-yrs)    Types: Cigarettes, E-cigarettes   Smokeless tobacco: Never  Vaping Use   Vaping status: Never Used  Substance and Sexual Activity   Alcohol use: No    Comment: Sober since 2017   Drug use: No   Sexual activity: Yes    Partners:  Female  Other Topics Concern   Not on file  Social History Narrative   Not on file   Social Drivers of Health   Financial Resource Strain: Not on file  Food Insecurity: Not on file  Transportation Needs: Not on file  Physical Activity: Not on file  Stress: Not on file  Social Connections: Not on file  Intimate Partner Violence: Not on file    Allergies  Allergen Reactions   Cymbalta  [Duloxetine  Hcl] Other (See Comments)    Headaches    Current Outpatient Medications  Medication Sig Dispense Refill   carvedilol  (COREG ) 6.25 MG tablet Take 1 tablet (6.25 mg total) by mouth 2 (two) times daily. 180 tablet 3   Cholecalciferol (VITAMIN D3) 50 MCG CAPS Take 2,000 Units by mouth daily at 12 noon.      cyclobenzaprine  (FLEXERIL ) 10 MG tablet Take 1 tablet (10 mg total) by mouth at bedtime as needed for muscle spasms. 90 tablet 3   enoxaparin  (LOVENOX ) 120 MG/0.8ML injection Inject 0.8 mLs (120 mg total) into the skin daily. USE AS DIRECTED BY ANTICOAGULATION CLINIC 7.2 mL 0   esomeprazole (NEXIUM) 20 MG capsule Take 20 mg by mouth daily before lunch.     FLUoxetine  (PROZAC ) 20 MG capsule Take 1 capsule (20 mg total) by mouth daily. 90 capsule 3   Fluticasone -Umeclidin-Vilant (TRELEGY ELLIPTA ) 100-62.5-25 MCG/ACT AEPB Inhale 1 puff into the lungs daily. 60 each 11   gabapentin  (NEURONTIN ) 100 MG capsule Take 2 capsules (200 mg total) by mouth at bedtime. 180 capsule 0   predniSONE  (DELTASONE ) 20 MG tablet Take 1 tablet (20 mg total) by mouth daily with breakfast. 5 tablet 0   rosuvastatin  (CRESTOR ) 10 MG tablet Take 1 tablet (10 mg total) by mouth daily. 90 tablet 3   triamcinolone  (NASACORT  ALLERGY 24HR) 55 MCG/ACT AERO nasal inhaler Place 1-2 sprays into the nose daily.     warfarin (COUMADIN ) 6 MG tablet TAKE 1 TABLET BY MOUTH DAILY EXCEPT TAKE 1/2 TABLET ON MONDAYS AND THURSDAYS OR AS DIRECTED BY ANTICOAGULATION CLINIC 95 tablet 1   No current facility-administered medications for this visit.    REVIEW OF SYSTEMS:  [X]  denotes positive finding, [ ]  denotes negative finding Cardiac  Comments:  Chest pain or chest pressure:    Shortness of breath upon exertion:    Short of breath when lying flat:    Irregular heart rhythm:        Vascular    Pain in calf, thigh, or hip brought on by ambulation:    Pain in feet at night that wakes you up from your sleep:     Blood clot in your veins:    Leg swelling:         Pulmonary    Oxygen at home:    Productive cough:     Wheezing:         Neurologic    Sudden weakness in arms or legs:     Sudden numbness in arms or legs:     Sudden onset of difficulty speaking or slurred speech:    Temporary loss of vision in one eye:     Problems with  dizziness:         Gastrointestinal    Blood in stool:     Vomited blood:         Genitourinary    Burning when urinating:     Blood in urine:        Psychiatric  Major depression:         Hematologic    Bleeding problems:    Problems with blood clotting too easily:        Skin    Rashes or ulcers:        Constitutional    Fever or chills:      PHYSICAL EXAM: There were no vitals filed for this visit.  GENERAL: The patient is a well-nourished male, in no acute distress. The vital signs are documented above. CARDIAC: There is a regular rate and rhythm.  VASCULAR:  Right femoral pulse palpable Right PT palpable   DATA:   Aortoiliac duplex shows widely patent right common iliac stent with no elevated velocity  ABI today 1.03 on the right multiphasic and 1.11 left triphasic   Assessment/Plan:  58 y.o. male, with multiple medical problems including antiphospholipid antibody syndrome on Coumadin  that presents for 1 year follow-up after previous right common iliac artery drug-coated balloon angioplasty for high-grade in-stent stenosis.  On 10/19/2022 he underwent DCB of the right common iliac stent that had a high-grade stenosis with a velocity of 323.    Previously had stent placed in 2017 by Dr. Laurence for claudication in the right common iliac artery.  He later underwent a right common iliac artery thrombectomy with right common femoral patch angioplasty in 2018 by Dr. Laurence.   Discussed his right iliac stent is patent today by duplex.  He has palpable pedal pulses with normal ABIs.  Overall doing well.   Will arrange follow-up again in 1 year with duplex imaging.     Lonni DOROTHA Gaskins, MD [facility] Office: [phone]

## 2023-12-18 NOTE — Patient Instructions (Signed)
 Pre visit review using our clinic review tool, if applicable. No additional management support is needed unless otherwise documented below in the visit note.  Continue 1 tablet (6 mg) daily except take 1 1/2 tablets (9 mg) on Thursday until starting Lovenox  bridge instructions.

## 2023-12-18 NOTE — Progress Notes (Signed)
 Pt tests at home. Result was placed on [employer] website by pt. INR was 2.8 Pt is scheduled for lumbar epidural for 8/11 and is being placed on a Lovenox  bridge. Pt has already been given instructions for the bridge and Lovenox  script has been sent. Pt will have to check INR the morning of the procedure.  Continue 1 tablet (6 mg) daily except take 1 1/2 tablets (9 mg) on Thursday until starting Lovenox  bridge instructions.  Sent mychart msg with dosing and retest date.  8/6: Take last dose of warfarin 8/7: NO warfarin, NO Lovenox  8/8: NO warfarin, inject Lovenox  in the AM 8/9: NO warfarin, inject Lovenox  in the AM 8/10: NO warfarin, inject Lovenox  in the AM (BEFORE 7 AM)   8/11: PROCEDURE; NO WARFARIN, NO LOVENOX    8/12: Take 1 1/2 tablets (9 mg) warfarin, inject Lovenox  once in the AM 8/13: Take 1 1/2 tablets (9 mg) warfarin, inject Lovenox  once in the AM 8/14: Take 2 tablets (12 mg) warfarin, inject Lovenox  once in the AM 8/15: Take 1 tablet (6 mg) warfarin, inject Lovenox  once in the AM 8/16: Take 1 tablet (6 mg) warfarin, inject Lovenox  once in the AM 8/17: Take 1 tablet (6 mg) warfarin, inject Lovenox  once in the AM 8/18: Recheck INR; NO WARFARIN AND NO LOVENOX  UNTIL AFTER INR CHECK

## 2023-12-28 NOTE — Discharge Instructions (Addendum)

## 2023-12-31 ENCOUNTER — Ambulatory Visit
Admission: RE | Admit: 2023-12-31 | Discharge: 2023-12-31 | Disposition: A | Discharge status: Home / Self Care | Source: Ambulatory Visit | Attending: Family Medicine | Admitting: Family Medicine

## 2023-12-31 ENCOUNTER — Other Ambulatory Visit: Payer: Self-pay | Admitting: Family Medicine

## 2023-12-31 ENCOUNTER — Ambulatory Visit (INDEPENDENT_AMBULATORY_CARE_PROVIDER_SITE_OTHER)

## 2023-12-31 DIAGNOSIS — M4726 Other spondylosis with radiculopathy, lumbar region: Secondary | ICD-10-CM | POA: Diagnosis not present

## 2023-12-31 DIAGNOSIS — M47816 Spondylosis without myelopathy or radiculopathy, lumbar region: Secondary | ICD-10-CM

## 2023-12-31 DIAGNOSIS — Z7901 Long term (current) use of anticoagulants: Secondary | ICD-10-CM

## 2023-12-31 DIAGNOSIS — M545 Low back pain, unspecified: Secondary | ICD-10-CM

## 2023-12-31 DIAGNOSIS — F339 Major depressive disorder, recurrent, unspecified: Secondary | ICD-10-CM

## 2023-12-31 LAB — POCT INR: INR: 1.2 — AB (ref 2.0–3.0)

## 2023-12-31 MED ORDER — IOPAMIDOL (ISOVUE-M 200) INJECTION 41%
1.0000 mL | Freq: Once | INTRAMUSCULAR | Status: AC
  Administered 2023-12-31 (×2): 1 mL via EPIDURAL

## 2023-12-31 MED ORDER — METHYLPREDNISOLONE ACETATE 40 MG/ML INJ SUSP (RADIOLOG
80.0000 mg | Freq: Once | INTRAMUSCULAR | Status: AC
  Administered 2023-12-31 (×2): 80 mg via EPIDURAL

## 2023-12-31 NOTE — Patient Instructions (Addendum)
 Pre visit review using our clinic review tool, if applicable. No additional management support is needed unless otherwise documented below in the visit note.  Continue Lovenox  bridge instructions and retest next Monday.

## 2023-12-31 NOTE — Progress Notes (Signed)
 Pt tests at home. Result was placed on [employer] website by pt. INR was 1.2 Pt is scheduled for lumbar epidural today and is on a Lovenox  bridge. Pt has already been given instructions for the bridge. Pt has to test the day of the procedure. Pt will follow Lovenox  bridge instructions after procedure.   8/6: Take last dose of warfarin 8/7: NO warfarin, NO Lovenox  8/8: NO warfarin, inject Lovenox  in the AM 8/9: NO warfarin, inject Lovenox  in the AM 8/10: NO warfarin, inject Lovenox  in the AM (BEFORE 7 AM)   8/11: PROCEDURE; NO WARFARIN, NO LOVENOX    8/12: Take 1 1/2 tablets (9 mg) warfarin, inject Lovenox  once in the AM 8/13: Take 1 1/2 tablets (9 mg) warfarin, inject Lovenox  once in the AM 8/14: Take 2 tablets (12 mg) warfarin, inject Lovenox  once in the AM 8/15: Take 1 tablet (6 mg) warfarin, inject Lovenox  once in the AM 8/16: Take 1 tablet (6 mg) warfarin, inject Lovenox  once in the AM 8/17: Take 1 tablet (6 mg) warfarin, inject Lovenox  once in the AM 8/18: Recheck INR; NO WARFARIN AND NO LOVENOX  UNTIL AFTER INR CHECK

## 2024-01-01 NOTE — Telephone Encounter (Signed)
 05/11/2023 LOV  01/03/2023 fill date  90/3 refills

## 2024-01-02 ENCOUNTER — Other Ambulatory Visit (HOSPITAL_COMMUNITY): Payer: Self-pay

## 2024-01-02 ENCOUNTER — Other Ambulatory Visit: Payer: Self-pay

## 2024-01-02 MED ORDER — FLUOXETINE HCL 20 MG PO CAPS
20.0000 mg | ORAL_CAPSULE | Freq: Every day | ORAL | 3 refills | Status: AC
  Filled 2024-01-02: qty 90, 90d supply, fill #0
  Filled 2024-04-04: qty 90, 90d supply, fill #1
  Filled 2024-06-24: qty 90, 90d supply, fill #2

## 2024-01-07 ENCOUNTER — Ambulatory Visit (INDEPENDENT_AMBULATORY_CARE_PROVIDER_SITE_OTHER): Payer: Self-pay

## 2024-01-07 DIAGNOSIS — Z7901 Long term (current) use of anticoagulants: Secondary | ICD-10-CM

## 2024-01-07 DIAGNOSIS — D6861 Antiphospholipid syndrome: Secondary | ICD-10-CM | POA: Diagnosis not present

## 2024-01-07 LAB — POCT INR: INR: 3.7 — AB (ref 2.0–3.0)

## 2024-01-07 NOTE — Patient Instructions (Signed)
Pre visit review using our clinic review tool, if applicable. No additional management support is needed unless otherwise documented below in the visit note. 

## 2024-01-07 NOTE — Progress Notes (Signed)
 Pt tests at home. Result was placed on [employer] website by pt. INR was 3.7 Pt is had a lumbar epidural one week ago and was on a Lovenox  bridge.  Hold warfarin today and then change weekly dose to take 1 Sent mychart msg with dosing instructions. tablet (6 mg) daily. Recheck in one week.

## 2024-01-11 ENCOUNTER — Encounter: Admitting: Family Medicine

## 2024-01-14 ENCOUNTER — Other Ambulatory Visit (HOSPITAL_COMMUNITY): Payer: Self-pay

## 2024-01-15 ENCOUNTER — Ambulatory Visit (INDEPENDENT_AMBULATORY_CARE_PROVIDER_SITE_OTHER): Payer: Self-pay

## 2024-01-15 DIAGNOSIS — Z7901 Long term (current) use of anticoagulants: Secondary | ICD-10-CM

## 2024-01-15 LAB — POCT INR: INR: 2.6 (ref 2.0–3.0)

## 2024-01-15 NOTE — Patient Instructions (Addendum)
 Pre visit review using our clinic review tool, if applicable. No additional management support is needed unless otherwise documented below in the visit note.  Continue 1 tablet (6 mg) daily. Recheck in two week.

## 2024-01-15 NOTE — Progress Notes (Signed)
 Pt tests at home. Result was placed on [employer] website by pt. INR was 2.6 Continue 1 tablet (6 mg) daily. Recheck in two week.  Sent mychart msg with dosing instructions.

## 2024-01-21 ENCOUNTER — Other Ambulatory Visit: Payer: Self-pay

## 2024-01-28 ENCOUNTER — Ambulatory Visit: Admitting: Family Medicine

## 2024-01-28 ENCOUNTER — Encounter: Payer: Self-pay | Admitting: Family Medicine

## 2024-01-28 VITALS — BP 123/78 | HR 81 | Temp 97.9°F | Ht 69.0 in | Wt 178.0 lb

## 2024-01-28 DIAGNOSIS — Z862 Personal history of diseases of the blood and blood-forming organs and certain disorders involving the immune mechanism: Secondary | ICD-10-CM

## 2024-01-28 DIAGNOSIS — D751 Secondary polycythemia: Secondary | ICD-10-CM | POA: Diagnosis not present

## 2024-01-28 DIAGNOSIS — Z0001 Encounter for general adult medical examination with abnormal findings: Secondary | ICD-10-CM

## 2024-01-28 DIAGNOSIS — Z0000 Encounter for general adult medical examination without abnormal findings: Secondary | ICD-10-CM

## 2024-01-28 DIAGNOSIS — I779 Disorder of arteries and arterioles, unspecified: Secondary | ICD-10-CM | POA: Diagnosis not present

## 2024-01-28 DIAGNOSIS — E782 Mixed hyperlipidemia: Secondary | ICD-10-CM

## 2024-01-28 DIAGNOSIS — G894 Chronic pain syndrome: Secondary | ICD-10-CM | POA: Diagnosis not present

## 2024-01-28 DIAGNOSIS — F17218 Nicotine dependence, cigarettes, with other nicotine-induced disorders: Secondary | ICD-10-CM

## 2024-01-28 DIAGNOSIS — R760 Raised antibody titer: Secondary | ICD-10-CM | POA: Diagnosis not present

## 2024-01-28 DIAGNOSIS — J439 Emphysema, unspecified: Secondary | ICD-10-CM | POA: Diagnosis not present

## 2024-01-28 DIAGNOSIS — S129XXS Fracture of neck, unspecified, sequela: Secondary | ICD-10-CM

## 2024-01-28 DIAGNOSIS — Z23 Encounter for immunization: Secondary | ICD-10-CM

## 2024-01-28 DIAGNOSIS — F339 Major depressive disorder, recurrent, unspecified: Secondary | ICD-10-CM

## 2024-01-28 DIAGNOSIS — K219 Gastro-esophageal reflux disease without esophagitis: Secondary | ICD-10-CM

## 2024-01-28 DIAGNOSIS — Z7901 Long term (current) use of anticoagulants: Secondary | ICD-10-CM | POA: Diagnosis not present

## 2024-01-28 LAB — LIPID PANEL
Cholesterol: 141 mg/dL (ref 0–200)
HDL: 45.6 mg/dL (ref >39.00)
LDL Cholesterol: 66 mg/dL (ref 0–99)
NonHDL: 94.98
Total CHOL/HDL Ratio: 3
Triglycerides: 146 mg/dL (ref 0.0–149.0)
VLDL: 29.2 mg/dL (ref 0.0–40.0)

## 2024-01-28 LAB — CBC WITH DIFFERENTIAL/PLATELET
Basophils Absolute: 0.1 K/uL (ref 0.0–0.1)
Basophils Relative: 0.9 % (ref 0.0–3.0)
Eosinophils Absolute: 0.2 K/uL (ref 0.0–0.7)
Eosinophils Relative: 1.9 % (ref 0.0–5.0)
HCT: 44.2 % (ref 39.0–52.0)
Hemoglobin: 14.7 g/dL (ref 13.0–17.0)
Lymphocytes Relative: 24.1 % (ref 12.0–46.0)
Lymphs Abs: 2.5 K/uL (ref 0.7–4.0)
MCHC: 33.4 g/dL (ref 30.0–36.0)
MCV: 97.4 fl (ref 78.0–100.0)
Monocytes Absolute: 0.5 K/uL (ref 0.1–1.0)
Monocytes Relative: 4.6 % (ref 3.0–12.0)
Neutro Abs: 7 K/uL (ref 1.4–7.7)
Neutrophils Relative %: 68.5 % (ref 43.0–77.0)
Platelets: 218 K/uL (ref 150.0–400.0)
RBC: 4.54 Mil/uL (ref 4.22–5.81)
RDW: 15.4 % (ref 11.5–15.5)
WBC: 10.2 K/uL (ref 4.0–10.5)

## 2024-01-28 LAB — COMPREHENSIVE METABOLIC PANEL WITH GFR
ALT: 20 U/L (ref 0–53)
AST: 18 U/L (ref 0–37)
Albumin: 4.1 g/dL (ref 3.5–5.2)
Alkaline Phosphatase: 84 U/L (ref 39–117)
BUN: 9 mg/dL (ref 6–23)
CO2: 27 meq/L (ref 19–32)
Calcium: 9.3 mg/dL (ref 8.4–10.5)
Chloride: 105 meq/L (ref 96–112)
Creatinine, Ser: 0.89 mg/dL (ref 0.40–1.50)
GFR: 95.77 mL/min (ref >60.00)
Glucose, Bld: 89 mg/dL (ref 70–99)
Potassium: 4.4 meq/L (ref 3.5–5.1)
Sodium: 140 meq/L (ref 135–145)
Total Bilirubin: 0.4 mg/dL (ref 0.2–1.2)
Total Protein: 7.2 g/dL (ref 6.0–8.3)

## 2024-01-28 LAB — TSH: TSH: 2.2 u[IU]/mL (ref 0.35–5.50)

## 2024-01-28 NOTE — Patient Instructions (Signed)
 Please return in 12 months for your annual complete physical; please come fasting. For follow up on chronic medical conditions   I will release your lab results to you on your MyChart account with further instructions. You may see the results before I do, but when I review them I will send you a message with my report or have my assistant call you if things need to be discussed. Please reply to my message with any questions. Thank you!   If you have any questions or concerns, please don't hesitate to send me a message via MyChart or call the office at [phone]. Thank you for visiting with us  today! It's our pleasure caring for you.   Please do these things to maintain good health!  Exercise at least 30-45 minutes a day,  4-5 days a week.  Eat a low-fat diet with lots of fruits and vegetables, up to 7-9 servings per day. Drink plenty of water daily. Try to drink 8 8oz glasses per day. Seatbelts can save your life. Always wear your seatbelt. Place Smoke Detectors on every level of your home and check batteries every year. Eye Doctor - have an eye exam every 1-2 years Safe sex - use condoms to protect yourself from STDs if you could be exposed to these types of infections. Avoid heavy alcohol use. If you drink, keep it to less than 2 drinks/day and not every day. Health Care Power of Attorney.  Choose someone you trust that could speak for you if you became unable to speak for yourself. Depression is common in our stressful world.If you're feeling down or losing interest in things you normally enjoy, please come in for a visit.

## 2024-01-28 NOTE — Progress Notes (Signed)
 Subjective  Chief Complaint  Patient presents with   Annual Exam    HPI: [name] is a 58 y.o. male who presents to [facility] [place] today for a Male Wellness Visit. He also has the concerns and/or needs as listed above in the chief complaint. These will be addressed in addition to the Health Maintenance Visit.   Wellness Visit: annual visit with health maintenance review and exam   HM: screens are current. Lung cancer and colon cancer. Sees multiple specialists. Reports overall stability. Eligible for flu vaccine today. Other imms current.   Body mass index is 26.29 kg/m. Wt Readings from Last 3 Encounters:  01/28/24 178 lb (80.7 kg)  12/18/23 172 lb 14.4 oz (78.4 kg)  11/21/23 175 lb (79.4 kg)   Chronic disease management visit and/or acute problem visit: Multiple medical problems are well-controlled.  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  Fortunately, chronic pain disorder for back pain is much improved after getting an epidural steroid injection that finally really worked.  He is active again, bike riding.  Not needing pain medicines.  Outlook is brighter. Chronic Prozac  continues to control depression well.  No side effects Reviewed recent visit with vascular surgery for follow-up of peripheral artery disease: Recent bypass remains patent by duplex.  Normal pulses pedal On statin and due for recheck, fasting.  Tolerates well Chronic smoker with recent lung cancer screening negative.  Pulmonary emphysema but denies symptoms.  Uses inhalers.  Precontemplative for quitting. GERD on chronic PPI stable.  Assessment  1. Encounter for well adult exam with abnormal findings   2. Need for influenza vaccination   3. Antiphospholipid antibody positive, requires lifetime Coumadin    4. Chronic pain disorder   5. Long term (current) use of anticoagulants [Z79.01]   6. Mixed hyperlipidemia   7. Cigarette nicotine dependence with other  nicotine-induced disorder   8. PAOD (peripheral arterrial occlusion disease) (HCC)   9. Pulmonary emphysema, unspecified emphysema type (HCC)   10. Pseudoarthrosis of cervical spine, sequela   11. Gastroesophageal reflux disease without esophagitis   12. Major depression, recurrent, chronic (HCC)      Plan  Male Wellness Visit: Age appropriate Health Maintenance and Prevention measures were discussed with patient. Included topics are cancer screening recommendations, ways to keep healthy (see AVS) including dietary and exercise recommendations, regular eye and dental care, use of seat belts, and avoidance of moderate alcohol use and tobacco use.  BMI: discussed patient's BMI and encouraged positive lifestyle modifications to help get to or maintain a target BMI. HM needs and immunizations were addressed and ordered. See below for orders. See HM and immunization section for updates.  Flu updated today Routine labs and screening tests ordered including cmp, cbc and lipids where appropriate. Discussed recommendations regarding Vit D and calcium  supplementation (see AVS)  Chronic disease f/u and/or acute problem visit: (deemed necessary to be done in addition to the wellness visit): Chronic problems are well-controlled.  Check lipids, LFTs, sodium and electrolytes, renal function and thyroid.  Will follow-up on CBC and iron  levels given history of iron  deficiency.  That should be resolved. Does not want to quit smoking, continue annual lung cancer screening and management of COPD with inhalers. Continue chronic anticoagulation. Continue Prozac  and PPI.  Follow up: 1 year for complete physical Commons side effects, risks, benefits, and alternatives for medications and treatment plan prescribed today were discussed, and the patient expressed understanding of the given instructions. Patient is instructed to  call or message via MyChart if he/she has any questions or concerns regarding our treatment  plan. No barriers to understanding were identified. We discussed Red Flag symptoms and signs in detail. Patient expressed understanding regarding what to do in case of urgent or emergency type symptoms.  Medication list was reconciled, printed and provided to the patient in AVS. Patient instructions and summary information was reviewed with the patient as documented in the AVS. This note was prepared with assistance of Dragon voice recognition software. Occasional wrong-word or sound-a-like substitutions may have occurred due to the inherent limitations of voice recognition software  Orders Placed This Encounter  Procedures   Flu vaccine trivalent PF, 6mos and older(Flulaval,Afluria,Fluarix,Fluzone)   No orders of the defined types were placed in this encounter.    Patient Active Problem List   Diagnosis Date Noted   Polycythemia 02/02/2019   PVC's (premature ventricular contractions) 02/02/2019   Pulmonary emphysema (HCC) 08/04/2018   Chronic pain disorder 08/04/2018   Chronic right-sided low back pain with right-sided sciatica 11/19/2016   Long term (current) use of anticoagulants [Z79.01] 09/15/2016   Mixed hyperlipidemia 06/27/2016   Antiphospholipid antibody positive, requires lifetime Coumadin  06/27/2016   PAOD (peripheral arterrial occlusion disease) (HCC) 06/02/2016   Nicotine dependence 06/24/2014   Major depression, recurrent, chronic (HCC) 02/02/2019   Cervical pseudoarthrosis (HCC 07/05/2018   History of alcohol abuse, sober since 04/2016 07/11/2016   Gastroesophageal reflux disease, with small hiatal hernia, Rx Nexium    Degenerative arthritis of right knee 09/06/2017   History of neutrophilia 06/18/2017   Erectile dysfunction 11/19/2016   Hx of iron  deficiency anemia due to chronic blood loss, hemorrhoids while on coumadin , s/p hemorrhoidectomy 11/2016    Right hip pain 11/21/2023   Anxiety disorder 02/02/2019   Arthritis of left acromioclavicular joint 09/29/2016    Health Maintenance  Topic Date Due   Medicare Annual Wellness (AWV)  Never done   Hepatitis B Vaccines 19-59 Average Risk (1 of 3 - 19+ 3-dose series) Never done   Influenza Vaccine  12/21/2023   COVID-19 Vaccine (3 - Pfizer risk series) 02/13/2024 (Originally 09/25/2019)   Lung Cancer Screening  08/21/2024   Colonoscopy  07/08/2026   DTaP/Tdap/Td (2 - Td or Tdap) 02/24/2027   Pneumococcal Vaccine: 50+ Years  Completed   Hepatitis C Screening  Completed   HIV Screening  Completed   Zoster Vaccines- Shingrix   Completed   HPV VACCINES  Aged Out   Meningococcal B Vaccine  Aged Out   Immunization History  Administered Date(s) Administered   Influenza,inj,Quad PF,6+ Mos 07/05/2016, 02/23/2017, 02/08/2018, 01/30/2019, 02/21/2021   Influenza-Unspecified 07/05/2016, 02/08/2018, 01/30/2019, 02/27/2020, 03/06/2022   PFIZER(Purple Top)SARS-COV-2 Vaccination 08/17/2019, 08/28/2019   PNEUMOCOCCAL CONJUGATE-20 05/01/2022   Tdap 02/23/2017   Zoster Recombinant(Shingrix ) 10/12/2021, 05/01/2022   We updated and reviewed the patient's past history in detail and it is documented below. Allergies: Patient is allergic to cymbalta  [duloxetine  hcl]. Past Medical History  has a past medical history of Anticoagulated on Coumadin , Antiphospholipid antibody positive, followed by Dr. Onesimo, Hematology (06/27/2016), Anxiety with depression (06/24/2014), Chronic back pain, ED (erectile dysfunction), GERD (gastroesophageal reflux disease), Hemorrhoids, internal, with bleeding, Hiatal hernia, History of adenomatous polyp of colon, 07/08/16, tubular adenomas, History of atrial fibrillation, episode 08/2016, History of cardiomyopathy (HCC) (08/04/2018), History of DVT of lower extremity, Hyperlipidemia, Iron  deficiency anemia due to chronic blood loss, thought to be from hemorrhoids while on coumadin , s/p iron  infusion and blood transfusion x 2, 2018 (2018), Low testosterone  in male (05/04/2017),  Neuropathy, peripheral,  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 Past Surgical History Patient  has a past surgical history that includes Knee surgery (Right, 1983); Cardiac catheterization (N/A, 02/24/2016); Cardiac catheterization (Right, 02/24/2016); Cardiac catheterization (N/A, 05/11/2016); Cardiac catheterization (N/A, 05/11/2016); Esophagogastroduodenoscopy (N/A, 07/08/2016); Colonoscopy (N/A, 07/08/2016); Thrombectomy femoral artery (Right, 07/17/2016); Patch angioplasty (Right, 07/17/2016); Intraoperative arteriogram (Right, 07/17/2016); Angioplasty illiac artery (Right, 07/17/2016); Groin debridement (Right, 07/25/2016); Anterior cervical decomp/discectomy fusion (07/21/2010); transthoracic echocardiogram (06-20-2016   dr hilty); Cardiovascular stress test (06-14-2016   dr hilty); Hemorrhoid surgery (N/A, 12/07/2016); Posterior cervical fusion/foraminotomy (N/A, 07/05/2018); ABDOMINAL AORTOGRAM W/LOWER EXTREMITY (N/A, 10/19/2022); and PERIPHERAL VASCULAR BALLOON ANGIOPLASTY (10/19/2022). Social History Patient  reports that he has been smoking cigarettes and e-cigarettes. He has a 42 pack-year smoking history. He has never used smokeless tobacco. He reports that he does not drink alcohol and does not use drugs. Family History family history includes Lung cancer in his father. Review of Systems: Constitutional: negative for fever or malaise Ophthalmic: negative for photophobia, double vision or loss of vision Cardiovascular: negative for chest pain, dyspnea on exertion, or new LE swelling Respiratory: negative for SOB or persistent cough Gastrointestinal: negative for abdominal pain, change in bowel habits or melena Genitourinary: negative for dysuria or gross hematuria Musculoskeletal: negative  for new gait disturbance or muscular weakness Integumentary: negative for new or persistent rashes Neurological: negative for TIA or stroke symptoms Psychiatric: negative for SI or delusions Allergic/Immunologic: negative for hives  Patient Care Team    Relationship Specialty Notifications Start End  Jodie Lavern CROME, MD PCP - General Family Medicine  04/22/19   Inocencio Soyla Lunger, MD PCP - Electrophysiology Cardiology Admissions 08/13/18   Mona Vinie BROCKS, MD Consulting Physician Cardiology  05/12/16   Onesimo Emaline Brink, MD Consulting Physician Hematology  07/11/16   Laurence Redell CROME, MD Consulting Physician Vascular Surgery  07/11/16   Louis Shove, MD Consulting Physician Neurosurgery  01/14/18   Legrand Shove CROME DOUGLAS, MD Consulting Physician Gastroenterology  05/13/18   Inocencio Soyla Lunger, MD Consulting Physician Cardiology  04/22/19   Claudene Arthea HERO, DO Referring Physician Family Medicine  07/05/20    Objective  Vitals: Temp 97.9 F (36.6 C)   Ht 5' 9 (1.753 m)   Wt 178 lb (80.7 kg)   BMI 26.29 kg/m  General:  Well developed, well nourished, no acute distress  Psych:  Alert and orientedx3,normal mood and affect HEENT:  Normocephalic, atraumatic, non-icteric sclera,  oropharynx is clear without mass or exudate, supple neck without adenopathy, or thyromegaly Cardiovascular:  Normal S1, S2, RRR without gallop, rub or murmur,  Respiratory:  Good breath sounds bilaterally, CTAB with normal respiratory effort Gastrointestinal: normal bowel sounds, soft, non-tender, no noted masses. No HSM MSK: Joints are without erythema or swelling.  Skin:  Warm, no rashes Neurologic:    Mental status is normal.  Gross motor and sensory exams are normal. Stable gait. No tremor

## 2024-01-29 ENCOUNTER — Ambulatory Visit (INDEPENDENT_AMBULATORY_CARE_PROVIDER_SITE_OTHER): Payer: Self-pay

## 2024-01-29 DIAGNOSIS — Z7901 Long term (current) use of anticoagulants: Secondary | ICD-10-CM

## 2024-01-29 LAB — POCT INR: INR: 2.7 (ref 2.0–3.0)

## 2024-01-29 LAB — IRON,TIBC AND FERRITIN PANEL
%SAT: 8 % — ABNORMAL LOW (ref 20–48)
Ferritin: 19 ng/mL — ABNORMAL LOW (ref 38–380)
Iron: 33 ug/dL — ABNORMAL LOW (ref 50–180)
TIBC: 421 ug/dL (ref 250–425)

## 2024-01-29 NOTE — Patient Instructions (Addendum)
 Pre visit review using our clinic review tool, if applicable. No additional management support is needed unless otherwise documented below in the visit note.  Continue 1 tablet (6 mg) daily. Recheck in two week.

## 2024-01-29 NOTE — Progress Notes (Signed)
 Pt tests at home. Result was placed on [employer] website by pt. INR was 2.7 Continue 1 tablet (6 mg) daily. Recheck in two week.  Sent mychart msg with dosing instructions.

## 2024-02-01 ENCOUNTER — Ambulatory Visit: Payer: Self-pay | Admitting: Family

## 2024-02-04 ENCOUNTER — Encounter: Payer: Self-pay | Admitting: Family Medicine

## 2024-02-05 NOTE — Progress Notes (Unsigned)
 [name] [address] [place] [address] Phone: [phone] Subjective:   [name], am serving as a scribe for Dr. Arthea Claudene.  I'm seeing this patient by the request  of:  Jodie Lavern CROME, MD  CC: right hip and back pain   YEP:Dlagzrupcz  11/21/2023 Severe right hip pain that seems to be worse with weightbearing but better with a flexion of the hip. Seems to be more in the posterior aspect but radiates to the anterior lateral aspect of the leg. Patient's pain is quite severe and occult fracture is a possibility. Has had significant difficulty though also with polycythemia as well as antiphospholipid antibody and is on a blood thinner. Patient has also a smoker still. These increase the risk of other pathologies in the vascular compromise that could also be affecting this. Does get more of this pain even with straightening on his back. At this point I do feel an MRI with contrast will be beneficial. The x-rays are pending. If worsening pain to seek medical attention, Toradol  and Depo-Medrol  injection given today. Patient was going to hold his Coumadin  for 1 day.   Patient is having low back pain but did not get any relief with the facet injections.  Still was not responding extremely well to the other injections either.  Patient was having more right hip pain it seems.  Has had vascular difficulties previously.  Discussed with patient and significant other about what to do.  Patient does have pain that is out of proportion that did not even allow him to stand for 20 seconds at a time over the weekend and has had some increasing groin pain.  Differential is quite broad and occult fracture is a possibility.  Discussed icing regimen and home exercises, increase activity slowly.  Follow-up with me again in 6 to 8 weeks otherwise.     Updated 02/07/2024 [name] is a 58 y.o. male coming in with complaint of R hip and LBP. Nerve root injection  August 11th. Patient states that he did have relief from injection. Did walk a distance and his back started hurting. Feeling ok now.       Past Medical History:  Diagnosis Date   Anticoagulated on Coumadin     Antiphospholipid antibody positive, followed by Dr. Onesimo, Hematology 06/27/2016   Anxiety with depression 06/24/2014   Chronic back pain    ED (erectile dysfunction)    GERD (gastroesophageal reflux disease)    Hemorrhoids, internal, with bleeding    Hiatal hernia    History of adenomatous polyp of colon, 07/08/16, tubular adenomas    History of atrial fibrillation, episode 08/2016    History of cardiomyopathy (HCC) 08/04/2018   ECHO 12/02/18  1. The left ventricle has normal systolic function with an ejection fraction of 60-65%. The cavity size was normal. Left ventricular diastolic parameters were normal.  2. The right ventricle has normal systolic function. The cavity was normal. There is no increase in right ventricular wall thickness.  3. The mitral valve is grossly normal.  4. The aortic valve is tricuspid. 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 Could increase thrombotic risk especially if he has  a polycythemic response.  If he decided to pursue this with an understanding of the risk - would use lowest dose of testosterone  to maintain level low normal 300-400.  2020: Secondary polycythemia. Not a candidate.   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[facility]), sober since 04/2016    S/P insertion of iliac artery stent 2018   02-23-2017  right CIA PTA and stent/05-11-2016 in-stent restenosis s/p thrombectomy/2018 thrombectomy and  patch angioplasty right femoral artery    Past Surgical History:  Procedure Laterality Date   ABDOMINAL AORTOGRAM W/LOWER EXTREMITY N/A 10/19/2022   Procedure: ABDOMINAL AORTOGRAM W/LOWER EXTREMITY;  Surgeon: Gretta Lonni PARAS, MD;  Location: [facility] INVASIVE CV LAB;  Service: Cardiovascular;  Laterality: N/A;   ANGIOPLASTY ILLIAC ARTERY Right 07/17/2016   Procedure: ANGIOPLASTY RIGHT COMMON ILIAC ARTERY;  Surgeon: Redell LITTIE Laurence, MD;  Location: [facility] OR;  Service: Vascular;  Laterality: Right;   ANTERIOR CERVICAL DECOMP/DISCECTOMY FUSION  07/21/2010   C5 -- C7   CARDIOVASCULAR STRESS TEST  06-14-2016   dr hilty   normal perfusion study w/ no reversible ischemia/  stress ef 44% but visually looks better , echo ordered (LVEF 30-44%)  , normal LV wall motion    COLONOSCOPY N/A 07/08/2016   Procedure: COLONOSCOPY;  Surgeon: Oliva Boots, MD;  Location: [facility]COPY;  Service: Endoscopy;  Laterality: N/A;   ESOPHAGOGASTRODUODENOSCOPY N/A 07/08/2016   Procedure: ESOPHAGOGASTRODUODENOSCOPY (EGD);  Surgeon: Oliva Boots, MD;  Location: [facility];  Service: Endoscopy;  Laterality: N/A;   GROIN DEBRIDEMENT Right 07/25/2016   Procedure: EVACUATION HEMATOMA RIGHT GROIN;  Surgeon: Penne Lonni Colorado, MD;  Location: [facility] OR;  Service: Vascular;  Laterality: Right;   HEMORRHOID SURGERY N/A 12/07/2016   Procedure: 3 COLUMN HEMORRHOIDECTOMY;  Surgeon: Debby Hila, MD;  [facility];  Service: General;  Laterality: N/A;   INTRAOPERATIVE ARTERIOGRAM Right 07/17/2016   Procedure: INTRA OPERATIVE ANGIOGRAM OF RIGHT COMMON ILIAC ARTERY;  Surgeon: Redell LITTIE Laurence, MD;  Location: [facility] OR;  Service: Vascular;  Laterality: Right;   KNEE SURGERY Right 1983   cartilage   PATCH  ANGIOPLASTY Right 07/17/2016   Procedure: PATCH ANGIOPLASTY RIGHT FEMORAL ARTERY USING GEORGE BIOLOGIC PATCH;  Surgeon: Redell LITTIE Laurence, MD;  Location: [facility] OR;  Service: Vascular;  Laterality: Right;   PERIPHERAL VASCULAR BALLOON ANGIOPLASTY  10/19/2022   Procedure: PERIPHERAL VASCULAR BALLOON ANGIOPLASTY;  Surgeon: Gretta Lonni PARAS, MD;  Location: [facility] INVASIVE CV LAB;  Service: Cardiovascular;;   PERIPHERAL VASCULAR CATHETERIZATION N/A 02/24/2016   Procedure: Abdominal Aortogram w/Lower Extremity;  Surgeon: Redell LITTIE Laurence, MD;  Location: [facility] INVASIVE CV LAB;  Service: Cardiovascular;  Laterality: N/A;   PERIPHERAL VASCULAR CATHETERIZATION Right 02/24/2016   Procedure: Peripheral Vascular Intervention;  Surgeon: Redell LITTIE Laurence, MD;  Location: [facility] INVASIVE CV LAB;  Service: Cardiovascular;  Laterality: Right;  Common iliac   PERIPHERAL VASCULAR CATHETERIZATION N/A 05/11/2016   Procedure: Abdominal Aortogram;  Surgeon: Redell LITTIE Laurence, MD;  Location: [facility] INVASIVE CV LAB;  Service: Cardiovascular;  Laterality: N/A;   PERIPHERAL VASCULAR CATHETERIZATION N/A 05/11/2016   Procedure: Lower Extremity Angiography;  Surgeon: Redell LITTIE Laurence, MD;  Location: [facility] INVASIVE CV LAB;  Service: Cardiovascular;  Laterality:  N/A;   POSTERIOR CERVICAL FUSION/FORAMINOTOMY N/A 07/05/2018   Procedure: Posterior cervical fusion with lateral mass fixation - Cervical five-Cervical six, Cervical six-Cervical seven;  Surgeon: Louis Shove, MD;  Location: [facility] OR;  Service: Neurosurgery;  Laterality: N/A;   THROMBECTOMY FEMORAL ARTERY Right 07/17/2016   Procedure: THROMBECTOMY RIGHT FEMORAL ARTERY;  Surgeon: Redell LITTIE Door, MD;  Location: [facility] OR;  Service: Vascular;  Laterality: Right;   TRANSTHORACIC ECHOCARDIOGRAM  06-20-2016   dr hilty   EF 55-60%,  grade 1 diastolic dysfunction, mild TR   Social History   Socioeconomic History   Marital status: Married    Spouse name: Mliss   Number of children: Not on file   Years of education: Not on file    Highest education level: Not on file  Occupational History   Occupation: Disabled  Tobacco Use   Smoking status: Every Day    Current packs/day: 1.00    Average packs/day: 1 pack/day for 42.0 years (42.0 ttl pk-yrs)    Types: Cigarettes, E-cigarettes   Smokeless tobacco: Never  Vaping Use   Vaping status: Never Used  Substance and Sexual Activity   Alcohol use: No    Comment: Sober since 2017   Drug use: No   Sexual activity: Yes    Partners: Female  Other Topics Concern   Not on file  Social History Narrative   Not on file   Social Drivers of Health   Financial Resource Strain: Low Risk  (01/26/2024)   Overall Financial Resource Strain (CARDIA)    Difficulty of Paying Living Expenses: Not very hard  Food Insecurity: No Food Insecurity (01/26/2024)   Hunger Vital Sign    Worried About Running Out of Food in the Last Year: Never true    Ran Out of Food in the Last Year: Never true  Transportation Needs: No Transportation Needs (01/26/2024)   PRAPARE - Administrator, Civil Service (Medical): No    Lack of Transportation (Non-Medical): No  Physical Activity: Unknown (01/26/2024)   Exercise Vital Sign    Days of Exercise per Week: 3 days    Minutes of Exercise per Session: Not on file  Stress: Stress Concern Present (01/26/2024)   [employer] of Occupational Health - Occupational Stress Questionnaire    Feeling of Stress: To some extent  Social Connections: Unknown (01/26/2024)   Social Connection and Isolation Panel    Frequency of Communication with Friends and Family: Patient declined    Frequency of Social Gatherings with Friends and Family: Patient declined    Attends Religious Services: Patient declined    Database administrator or Organizations: No    Attends Engineer, structural: Not on file    Marital Status: Married   Allergies  Allergen Reactions   Cymbalta  [Duloxetine  Hcl] Other (See Comments)    Headaches   Family History  Problem  Relation Age of Onset   Lung cancer Father      Current Outpatient Medications (Cardiovascular):    carvedilol  (COREG ) 6.25 MG tablet, Take 1 tablet (6.25 mg total) by mouth 2 (two) times daily.   rosuvastatin  (CRESTOR ) 10 MG tablet, Take 1 tablet (10 mg total) by mouth daily.  Current Outpatient Medications (Respiratory):    Fluticasone -Umeclidin-Vilant (TRELEGY ELLIPTA ) 100-62.5-25 MCG/ACT AEPB, Inhale 1 puff into the lungs daily.   triamcinolone  (NASACORT  ALLERGY 24HR) 55 MCG/ACT AERO nasal inhaler, Place 1-2 sprays into the nose daily.   Current Outpatient Medications (Hematological):    enoxaparin  (LOVENOX ) 120  MG/0.8ML injection, Inject 0.8 mLs (120 mg total) into the skin daily. USE AS DIRECTED BY ANTICOAGULATION CLINIC   warfarin (COUMADIN ) 6 MG tablet, TAKE 1 TABLET BY MOUTH DAILY EXCEPT TAKE 1/2 TABLET ON MONDAYS AND THURSDAYS OR AS DIRECTED BY ANTICOAGULATION CLINIC  Current Outpatient Medications (Other):    Cholecalciferol (VITAMIN D3) 50 MCG CAPS, Take 2,000 Units by mouth daily at 12 noon.   cyclobenzaprine  (FLEXERIL ) 10 MG tablet, Take 1 tablet (10 mg total) by mouth at bedtime as needed for muscle spasms.   esomeprazole (NEXIUM) 20 MG capsule, Take 20 mg by mouth daily before lunch.   FLUoxetine  (PROZAC ) 20 MG capsule, Take 1 capsule (20 mg total) by mouth daily.   gabapentin  (NEURONTIN ) 100 MG capsule, Take 2 capsules (200 mg total) by mouth at bedtime.   Reviewed prior external information including notes and imaging from  primary care provider As well as notes that were available from care everywhere and other healthcare systems.  Past medical history, social, surgical and family history all reviewed in electronic medical record.  No pertanent information unless stated regarding to the chief complaint.   Review of Systems:  No headache, visual changes, nausea, vomiting, diarrhea, constipation, dizziness, abdominal pain, skin rash, fevers, chills, night sweats,  weight loss, swollen lymph nodes, body aches, joint swelling, chest pain, shortness of breath, mood changes. POSITIVE muscle aches  Objective  Blood pressure (!) 144/100, pulse 96, height 5' 9 (1.753 m), weight 177 lb (80.3 kg), SpO2 98%.   General: No apparent distress alert and oriented x3 mood and affect normal, dressed appropriately.  HEENT: Pupils equal, extraocular movements intact  Respiratory: Patient's speak in full sentences and does not appear short of breath  Cardiovascular: No lower extremity edema, non tender, no erythema  Low back still some tenderness to palpation over the L4 and L5 area on the paraspinal musculature on the right side.  Pain also minorly  Over the sacroiliac joint.  Positive FABER test with tenderness over the piriformis  After verbal consent patient was prepped with alcohol swab and with a 21-gauge 2 inch needle patient was injected into the right piriformis with a total of 1 cc of 0.5% Marcaine  and 1 cc of Kenalog  40 mg/mL.  No blood loss.  Band-Aid placed.  Postinjection instructions given.   Impression and Recommendations:     The above documentation has been reviewed and is accurate and complete Kristianne Albin M Taquan Bralley, DO

## 2024-02-07 ENCOUNTER — Ambulatory Visit: Admitting: Family Medicine

## 2024-02-07 ENCOUNTER — Encounter: Payer: Self-pay | Admitting: Family Medicine

## 2024-02-07 ENCOUNTER — Other Ambulatory Visit: Payer: Self-pay

## 2024-02-07 VITALS — BP 144/100 | HR 96 | Ht 69.0 in | Wt 177.0 lb

## 2024-02-07 DIAGNOSIS — D500 Iron deficiency anemia secondary to blood loss (chronic): Secondary | ICD-10-CM

## 2024-02-07 DIAGNOSIS — M25551 Pain in right hip: Secondary | ICD-10-CM

## 2024-02-07 NOTE — Assessment & Plan Note (Signed)
 Patient's ferritin was significantly lower at 19 again.  Will send patient for transfusion that could be contributing to some of the fatigue at this time.

## 2024-02-07 NOTE — Patient Instructions (Addendum)
 Iron  transfusion See me in 2-3 months

## 2024-02-07 NOTE — Assessment & Plan Note (Signed)
 Patient did not have significant improvement with the epidural but initially it was pain-free so we may need to consider the possibility of repeat that at this point try to piriformis in the office.  Will see if this is something that was a little more causing the radicular symptoms.  We discussed with patient to continue the other meds.  Does have more of the worsening of iron  deficiency so we will we should consider the possibility of a transfusion.  We discussed icing regimen otherwise.  Follow-up with me again in 2 months.

## 2024-02-12 ENCOUNTER — Ambulatory Visit (INDEPENDENT_AMBULATORY_CARE_PROVIDER_SITE_OTHER): Payer: Self-pay

## 2024-02-12 DIAGNOSIS — Z7901 Long term (current) use of anticoagulants: Secondary | ICD-10-CM

## 2024-02-12 LAB — POCT INR: INR: 2.6 (ref 2.0–3.0)

## 2024-02-12 NOTE — Progress Notes (Signed)
 Pt tests at home. Result was placed on [employer] website by pt. INR was 2.6 Continue 1 tablet (6 mg) daily. Recheck in two week.  Sent mychart msg with dosing instructions.

## 2024-02-12 NOTE — Patient Instructions (Addendum)
 Pre visit review using our clinic review tool, if applicable. No additional management support is needed unless otherwise documented below in the visit note.  Continue 1 tablet (6 mg) daily. Recheck in two week.

## 2024-02-14 ENCOUNTER — Other Ambulatory Visit (HOSPITAL_COMMUNITY): Payer: Self-pay

## 2024-02-14 ENCOUNTER — Encounter: Payer: Self-pay | Admitting: Family Medicine

## 2024-02-14 DIAGNOSIS — D509 Iron deficiency anemia, unspecified: Secondary | ICD-10-CM | POA: Insufficient documentation

## 2024-02-15 ENCOUNTER — Telehealth: Payer: Self-pay

## 2024-02-15 NOTE — Telephone Encounter (Signed)
 Dr. Claudene and [place], patient will be scheduled as soon as possible.  Auth Submission: NO AUTH NEEDED Site of care: Site of care: [facility] Payer: [employer] commercial Medication & CPT/J Code(s) submitted: Venofer  (Iron  Sucrose) J1756 Diagnosis Code:  Route of submission (phone, fax, portal):  Phone # Fax # Auth type: Buy/Bill PB Units/visits requested: 200mg  x 5 doses Reference number:  Approval from: 02/15/24 to 05/21/24

## 2024-02-18 ENCOUNTER — Other Ambulatory Visit: Payer: Self-pay | Admitting: Family Medicine

## 2024-02-18 ENCOUNTER — Other Ambulatory Visit: Payer: Self-pay

## 2024-02-18 MED ORDER — GABAPENTIN 100 MG PO CAPS
200.0000 mg | ORAL_CAPSULE | Freq: Every day | ORAL | 0 refills | Status: AC
  Filled 2024-02-18: qty 60, 30d supply, fill #0

## 2024-02-26 ENCOUNTER — Ambulatory Visit (INDEPENDENT_AMBULATORY_CARE_PROVIDER_SITE_OTHER): Payer: Self-pay

## 2024-02-26 ENCOUNTER — Ambulatory Visit

## 2024-02-26 DIAGNOSIS — Z7901 Long term (current) use of anticoagulants: Secondary | ICD-10-CM

## 2024-02-26 DIAGNOSIS — D6861 Antiphospholipid syndrome: Secondary | ICD-10-CM | POA: Diagnosis not present

## 2024-02-26 LAB — POCT INR: INR: 2.6 (ref 2.0–3.0)

## 2024-02-26 NOTE — Progress Notes (Addendum)
 Pt tests at home. Result was placed on [employer] website by pt. INR was 2.6 Continue 1 tablet (6 mg) daily. Recheck in two week.  Sent mychart msg with dosing instructions.   Medical screening examination/treatment/procedure(s) were performed by non-physician practitioner and as supervising physician I was immediately available for consultation/collaboration.  I agree with above. Karlynn Noel, MD

## 2024-02-26 NOTE — Patient Instructions (Addendum)
 Pre visit review using our clinic review tool, if applicable. No additional management support is needed unless otherwise documented below in the visit note.  Continue 1 tablet (6 mg) daily. Recheck in two week.

## 2024-02-28 ENCOUNTER — Ambulatory Visit

## 2024-02-28 VITALS — BP 123/80 | HR 60 | Temp 98.1°F | Resp 16 | Ht 69.0 in | Wt 176.6 lb

## 2024-02-28 DIAGNOSIS — D509 Iron deficiency anemia, unspecified: Secondary | ICD-10-CM | POA: Diagnosis not present

## 2024-02-28 DIAGNOSIS — D500 Iron deficiency anemia secondary to blood loss (chronic): Secondary | ICD-10-CM

## 2024-02-28 MED ORDER — IRON SUCROSE 20 MG/ML IV SOLN
200.0000 mg | Freq: Once | INTRAVENOUS | Status: AC
  Administered 2024-02-28: 200 mg via INTRAVENOUS
  Filled 2024-02-28: qty 10

## 2024-02-28 NOTE — Progress Notes (Signed)
 Diagnosis: Iron  Deficiency Anemia  Provider:  Praveen Mannam MD  Procedure: IV Push  IV Type: Peripheral, IV Location: R Hand  Venofer  (Iron  Sucrose), Dose: 200 mg  Post Infusion IV Care: Patient declined observation and Peripheral IV Discontinued  Discharge: Condition: Good, Destination: Home . AVS Declined  Performed by:  Lauran Pollard, LPN

## 2024-03-03 ENCOUNTER — Ambulatory Visit: Admitting: *Deleted

## 2024-03-03 VITALS — BP 128/68 | HR 67 | Temp 98.2°F | Resp 16 | Ht 69.0 in | Wt 178.8 lb

## 2024-03-03 DIAGNOSIS — D509 Iron deficiency anemia, unspecified: Secondary | ICD-10-CM | POA: Diagnosis not present

## 2024-03-03 DIAGNOSIS — D500 Iron deficiency anemia secondary to blood loss (chronic): Secondary | ICD-10-CM

## 2024-03-03 MED ORDER — SODIUM CHLORIDE 0.9 % IV BOLUS (SEPSIS)
250.0000 mL | Freq: Once | INTRAVENOUS | Status: DC
  Filled 2024-03-03: qty 250

## 2024-03-03 MED ORDER — IRON SUCROSE 20 MG/ML IV SOLN
200.0000 mg | Freq: Once | INTRAVENOUS | Status: AC
  Administered 2024-03-03: 200 mg via INTRAVENOUS
  Filled 2024-03-03: qty 10

## 2024-03-03 NOTE — Progress Notes (Signed)
 Diagnosis: Iron  Deficiency Anemia  Provider:  Mannam, Praveen MD  Procedure: IV Push  IV Type: Peripheral, IV Location: R Forearm  Venofer  (Iron  Sucrose), Dose: 200 mg  Post Infusion IV Care: Patient declined observation and Peripheral IV Discontinued  Discharge: Condition: Good, Destination: Home . AVS Declined  Performed by:  Jerni Selmer E, RN

## 2024-03-07 ENCOUNTER — Ambulatory Visit (INDEPENDENT_AMBULATORY_CARE_PROVIDER_SITE_OTHER): Admitting: *Deleted

## 2024-03-07 VITALS — BP 118/78 | HR 70 | Temp 98.0°F | Resp 18 | Ht 69.0 in | Wt 173.6 lb

## 2024-03-07 DIAGNOSIS — D509 Iron deficiency anemia, unspecified: Secondary | ICD-10-CM | POA: Diagnosis not present

## 2024-03-07 DIAGNOSIS — D500 Iron deficiency anemia secondary to blood loss (chronic): Secondary | ICD-10-CM

## 2024-03-07 MED ORDER — IRON SUCROSE 20 MG/ML IV SOLN
200.0000 mg | Freq: Once | INTRAVENOUS | Status: AC
  Administered 2024-03-07: 200 mg via INTRAVENOUS
  Filled 2024-03-07: qty 10

## 2024-03-07 NOTE — Progress Notes (Signed)
 Diagnosis: Iron  Deficiency Anemia  Provider:  Mannam, Praveen MD  Procedure: IV Push  IV Type: Peripheral, IV Location: R Hand  Venofer  (Iron  Sucrose), Dose: 200 mg  Post Infusion IV Care: Patient declined observation and Peripheral IV Discontinued  Discharge: Condition: Good, Destination: Home . AVS Declined  Performed by:  Felesha Moncrieffe E, RN

## 2024-03-10 ENCOUNTER — Other Ambulatory Visit: Payer: Self-pay | Admitting: Family Medicine

## 2024-03-10 ENCOUNTER — Ambulatory Visit (INDEPENDENT_AMBULATORY_CARE_PROVIDER_SITE_OTHER): Admitting: *Deleted

## 2024-03-10 VITALS — BP 117/76 | HR 71 | Temp 97.9°F | Resp 16 | Ht 69.0 in | Wt 174.2 lb

## 2024-03-10 DIAGNOSIS — D509 Iron deficiency anemia, unspecified: Secondary | ICD-10-CM

## 2024-03-10 DIAGNOSIS — D500 Iron deficiency anemia secondary to blood loss (chronic): Secondary | ICD-10-CM | POA: Diagnosis not present

## 2024-03-10 DIAGNOSIS — D6859 Other primary thrombophilia: Secondary | ICD-10-CM

## 2024-03-10 DIAGNOSIS — Z7901 Long term (current) use of anticoagulants: Secondary | ICD-10-CM

## 2024-03-10 MED ORDER — IRON SUCROSE 20 MG/ML IV SOLN
200.0000 mg | Freq: Once | INTRAVENOUS | Status: AC
  Administered 2024-03-10: 200 mg via INTRAVENOUS
  Filled 2024-03-10: qty 10

## 2024-03-10 NOTE — Progress Notes (Signed)
 Diagnosis: Iron  Deficiency Anemia  Provider:  Mannam, Praveen MD  Procedure: IV Push  IV Type: Peripheral, IV Location: R Hand  Venofer  (Iron  Sucrose), Dose: 200 mg  Post Infusion IV Care: Observation period completed and Peripheral IV Discontinued  Discharge: Condition: Good, Destination: Home . AVS Declined  Performed by:  Trudy Lamarr LABOR, RN

## 2024-03-12 ENCOUNTER — Other Ambulatory Visit (HOSPITAL_COMMUNITY): Payer: Self-pay

## 2024-03-12 ENCOUNTER — Ambulatory Visit: Payer: Self-pay

## 2024-03-12 ENCOUNTER — Ambulatory Visit (INDEPENDENT_AMBULATORY_CARE_PROVIDER_SITE_OTHER)

## 2024-03-12 VITALS — BP 138/84 | HR 83 | Temp 98.0°F | Resp 16 | Ht 69.0 in | Wt 174.4 lb

## 2024-03-12 DIAGNOSIS — Z7901 Long term (current) use of anticoagulants: Secondary | ICD-10-CM

## 2024-03-12 DIAGNOSIS — D509 Iron deficiency anemia, unspecified: Secondary | ICD-10-CM | POA: Diagnosis not present

## 2024-03-12 DIAGNOSIS — D500 Iron deficiency anemia secondary to blood loss (chronic): Secondary | ICD-10-CM

## 2024-03-12 LAB — POCT INR: INR: 2.5 (ref 2.0–3.0)

## 2024-03-12 MED ORDER — IRON SUCROSE 20 MG/ML IV SOLN
200.0000 mg | Freq: Once | INTRAVENOUS | Status: AC
  Administered 2024-03-12: 200 mg via INTRAVENOUS
  Filled 2024-03-12: qty 10

## 2024-03-12 MED ORDER — WARFARIN SODIUM 6 MG PO TABS
ORAL_TABLET | ORAL | 1 refills | Status: AC
  Filled 2024-03-12: qty 90, 90d supply, fill #0
  Filled 2024-06-24: qty 90, 90d supply, fill #1

## 2024-03-12 NOTE — Patient Instructions (Addendum)
 Pre visit review using our clinic review tool, if applicable. No additional management support is needed unless otherwise documented below in the visit note.  Continue 1 tablet (6 mg) daily. Recheck in two week.

## 2024-03-12 NOTE — Progress Notes (Signed)
 Pt tests at home. Result was placed on [employer] website by pt. INR was 2.5 Continue 1 tablet (6 mg) daily. Recheck in two week.  Sent mychart msg with dosing instructions.

## 2024-03-12 NOTE — Progress Notes (Signed)
 I have reviewed and agree with note, evaluation, plan.   Garnette Lukes, MD

## 2024-03-12 NOTE — Telephone Encounter (Signed)
 Pt is compliant with warfarin management and PCP apts.  Sent in refill of warfarin to requested pharmacy.

## 2024-03-12 NOTE — Progress Notes (Signed)
 Diagnosis: Iron  Deficiency Anemia  Provider:  Praveen Mannam MD  Procedure: IV Push  IV Type: Peripheral, IV Location: R Hand  Venofer  (Iron  Sucrose), Dose: 200 mg  Post Infusion IV Care: Patient declined observation  Discharge: Condition: Good, Destination: Home . AVS Declined  Performed by:  Eleanor DELENA Bloch, RN

## 2024-03-13 ENCOUNTER — Other Ambulatory Visit: Payer: Self-pay

## 2024-03-13 ENCOUNTER — Encounter: Payer: Self-pay | Admitting: Family Medicine

## 2024-03-13 DIAGNOSIS — R5383 Other fatigue: Secondary | ICD-10-CM

## 2024-03-13 NOTE — Telephone Encounter (Signed)
 Labs ordered. Patient notified.

## 2024-03-17 DIAGNOSIS — H524 Presbyopia: Secondary | ICD-10-CM | POA: Diagnosis not present

## 2024-03-25 ENCOUNTER — Encounter: Payer: Self-pay | Admitting: Family Medicine

## 2024-03-25 ENCOUNTER — Other Ambulatory Visit: Payer: Self-pay

## 2024-03-25 DIAGNOSIS — M545 Low back pain, unspecified: Secondary | ICD-10-CM

## 2024-03-25 NOTE — Telephone Encounter (Signed)
Epidural ordered. Patient notified.  

## 2024-03-26 ENCOUNTER — Ambulatory Visit: Payer: Self-pay

## 2024-03-26 ENCOUNTER — Telehealth: Payer: Self-pay | Admitting: Family Medicine

## 2024-03-26 DIAGNOSIS — Z7901 Long term (current) use of anticoagulants: Secondary | ICD-10-CM | POA: Diagnosis not present

## 2024-03-26 LAB — POCT INR: INR: 2.8 (ref 2.0–3.0)

## 2024-03-26 NOTE — Patient Instructions (Addendum)
 Pre visit review using our clinic review tool, if applicable. No additional management support is needed unless otherwise documented below in the visit note.  Continue 1 tablet (6 mg) daily. Recheck in two week.

## 2024-03-26 NOTE — Progress Notes (Signed)
 Pt tests at home. Result was placed on [employer] website by pt. INR was 2.8 Pt will be scheduling another lumbar epidural before the end of the year. He will advise coumadin  clinic when this is scheduled so a lovenox  bridge schedule can be created.  Continue 1 tablet (6 mg) daily. Recheck in two week.  Sent mychart msg with dosing instructions.

## 2024-03-26 NOTE — Telephone Encounter (Signed)
 [facility] faxed blood thinner clearance, to be filled out by provider. Patient requested to send it back via Fax within ASAP. Document is located in providers tray at front office.Please advise at [phone].

## 2024-03-27 NOTE — Telephone Encounter (Signed)
 For has been faxed.

## 2024-03-27 NOTE — Telephone Encounter (Signed)
 Creating Lovenox  bridge schedule for procedure. Will forward to provider when complete.

## 2024-03-27 NOTE — Telephone Encounter (Signed)
 Procedure: lumbar epidural; scheduled for 12/3

## 2024-03-28 NOTE — Telephone Encounter (Signed)
 Indication: DVT, APS  Procedure: Lumbar epidural  Recommend Lovenox  bridge;  Current wt: 79.1 kg CrCl: 103.69 mL/min  Recommended Lovenox  dose is 120 mg daily.  11/28: Take last dose of warfarin 11/29: NO warfarin, NO Lovenox  11/30: NO warfarin, inject Lovenox  once in the AM 12/1: NO warfarin, inject Lovenox  once in the AM 12/2: NO warfarin, inject Lovenox  once in the AM BEFORE 7 AM  12/3: PROCEDURE; NO WARFARIN, NO LOVENOX   12/4: Take 1 1/2 tablets (9 mg) warfarin, inject Lovenox  once in the AM 12/5: Take 1 1/2 tablets (9 mg) warfarin, inject Lovenox  once in the AM 12/6: Take 1 1/2 tablets (9 mg) warfarin, inject Lovenox  once in the AM 12/7: Take 1 tablet (6 mg) warfarin, inject Lovenox  once in the AM 12/8: Take 1 tablet (6 mg) warfarin, inject Lovenox  once in the AM 12/9: Take 1 tablet (6 mg) warfarin, inject Lovenox  once in the AM 12/10: Check INR; NO WARFARIN AND NO LOVENOX  UNTIL AFTER INR CHECK

## 2024-03-31 NOTE — Telephone Encounter (Signed)
 Will provide instructions at pt's next INR testing date on 11/19.

## 2024-04-05 ENCOUNTER — Other Ambulatory Visit (HOSPITAL_COMMUNITY): Payer: Self-pay

## 2024-04-08 ENCOUNTER — Other Ambulatory Visit (HOSPITAL_COMMUNITY): Payer: Self-pay

## 2024-04-08 ENCOUNTER — Other Ambulatory Visit: Payer: Self-pay

## 2024-04-09 ENCOUNTER — Other Ambulatory Visit: Payer: Self-pay

## 2024-04-09 ENCOUNTER — Ambulatory Visit (INDEPENDENT_AMBULATORY_CARE_PROVIDER_SITE_OTHER): Payer: Self-pay

## 2024-04-09 ENCOUNTER — Other Ambulatory Visit (HOSPITAL_COMMUNITY): Payer: Self-pay

## 2024-04-09 DIAGNOSIS — Z7901 Long term (current) use of anticoagulants: Secondary | ICD-10-CM | POA: Diagnosis not present

## 2024-04-09 LAB — POCT INR: INR: 2.7 (ref 2.0–3.0)

## 2024-04-09 MED ORDER — ENOXAPARIN SODIUM 120 MG/0.8ML IJ SOSY
120.0000 mg | PREFILLED_SYRINGE | INTRAMUSCULAR | 0 refills | Status: AC
  Filled 2024-04-09: qty 7.2, 9d supply, fill #0

## 2024-04-09 NOTE — Patient Instructions (Addendum)
 Pre visit review using our clinic review tool, if applicable. No additional management support is needed unless otherwise documented below in the visit note.  Continue 1 tablet (6 mg) daily until starting instructions below 11/28: Take last dose of warfarin 11/29: NO warfarin, NO Lovenox  11/30: NO warfarin, inject Lovenox  once in the AM 12/1: NO warfarin, inject Lovenox  once in the AM 12/2: NO warfarin, inject Lovenox  once in the AM BEFORE 7 AM   12/3: PROCEDURE; NO WARFARIN, NO LOVENOX    12/4: Take 1 1/2 tablets (9 mg) warfarin, inject Lovenox  once in the AM 12/5: Take 1 1/2 tablets (9 mg) warfarin, inject Lovenox  once in the AM 12/6: Take 1 1/2 tablets (9 mg) warfarin, inject Lovenox  once in the AM 12/7: Take 1 tablet (6 mg) warfarin, inject Lovenox  once in the AM 12/8: Take 1 tablet (6 mg) warfarin, inject Lovenox  once in the AM 12/9: Take 1 tablet (6 mg) warfarin, inject Lovenox  once in the AM 12/10: Check INR; NO WARFARIN AND NO LOVENOX  UNTIL AFTER INR CHECK

## 2024-04-09 NOTE — Progress Notes (Signed)
 I have reviewed and agree with note, evaluation, plan.   Garnette Lukes, MD

## 2024-04-09 NOTE — Progress Notes (Signed)
 Pt tests at home. Result was placed on [employer] website by pt. INR was 2.7 Pt is scheduled for a lumbar epidural on 12/3 and will be on a lovenox  bridge.  Sent mychart msg with dosing and lovenox  bridge instructions. Continue 1 tablet (6 mg) daily until starting instructions below 11/28: Take last dose of warfarin 11/29: NO warfarin, NO Lovenox  11/30: NO warfarin, inject Lovenox  once in the AM 12/1: NO warfarin, inject Lovenox  once in the AM 12/2: NO warfarin, inject Lovenox  once in the AM BEFORE 7 AM   12/3: PROCEDURE; NO WARFARIN, NO LOVENOX    12/4: Take 1 1/2 tablets (9 mg) warfarin, inject Lovenox  once in the AM 12/5: Take 1 1/2 tablets (9 mg) warfarin, inject Lovenox  once in the AM 12/6: Take 1 1/2 tablets (9 mg) warfarin, inject Lovenox  once in the AM 12/7: Take 1 tablet (6 mg) warfarin, inject Lovenox  once in the AM 12/8: Take 1 tablet (6 mg) warfarin, inject Lovenox  once in the AM 12/9: Take 1 tablet (6 mg) warfarin, inject Lovenox  once in the AM 12/10: Check INR; NO WARFARIN AND NO LOVENOX  UNTIL AFTER INR CHECK Sent in script for Lovenox  Pt will need to check INR the morning of the procedure per radiology. Pt will need this result placed in his chart on that day ASAP. Made note to check for INR result.

## 2024-04-10 ENCOUNTER — Other Ambulatory Visit (INDEPENDENT_AMBULATORY_CARE_PROVIDER_SITE_OTHER)

## 2024-04-10 ENCOUNTER — Ambulatory Visit: Payer: Self-pay | Admitting: Family Medicine

## 2024-04-10 DIAGNOSIS — R5383 Other fatigue: Secondary | ICD-10-CM | POA: Diagnosis not present

## 2024-04-10 LAB — CBC WITH DIFFERENTIAL/PLATELET
Basophils Absolute: 0.1 K/uL (ref 0.0–0.1)
Basophils Relative: 0.7 % (ref 0.0–3.0)
Eosinophils Absolute: 0.7 K/uL (ref 0.0–0.7)
Eosinophils Relative: 5.6 % — ABNORMAL HIGH (ref 0.0–5.0)
HCT: 47.2 % (ref 39.0–52.0)
Hemoglobin: 16 g/dL (ref 13.0–17.0)
Lymphocytes Relative: 18.9 % (ref 12.0–46.0)
Lymphs Abs: 2.4 K/uL (ref 0.7–4.0)
MCHC: 33.9 g/dL (ref 30.0–36.0)
MCV: 96.8 fl (ref 78.0–100.0)
Monocytes Absolute: 0.6 K/uL (ref 0.1–1.0)
Monocytes Relative: 4.9 % (ref 3.0–12.0)
Neutro Abs: 8.9 K/uL — ABNORMAL HIGH (ref 1.4–7.7)
Neutrophils Relative %: 69.9 % (ref 43.0–77.0)
Platelets: 238 K/uL (ref 150.0–400.0)
RBC: 4.88 Mil/uL (ref 4.22–5.81)
RDW: 14.8 % (ref 11.5–15.5)
WBC: 12.8 K/uL — ABNORMAL HIGH (ref 4.0–10.5)

## 2024-04-10 LAB — IBC PANEL
Iron: 109 ug/dL (ref 42–165)
Saturation Ratios: 34.1 % (ref 20.0–50.0)
TIBC: 319.2 ug/dL (ref 250.0–450.0)
Transferrin: 228 mg/dL (ref 212.0–360.0)

## 2024-04-10 LAB — FERRITIN: Ferritin: 131.5 ng/mL (ref 22.0–322.0)

## 2024-04-20 ENCOUNTER — Other Ambulatory Visit: Payer: Self-pay | Admitting: Cardiology

## 2024-04-21 ENCOUNTER — Other Ambulatory Visit (HOSPITAL_COMMUNITY): Payer: Self-pay

## 2024-04-21 ENCOUNTER — Other Ambulatory Visit: Payer: Self-pay

## 2024-04-21 MED ORDER — CARVEDILOL 6.25 MG PO TABS
6.2500 mg | ORAL_TABLET | Freq: Two times a day (BID) | ORAL | 3 refills | Status: AC
  Filled 2024-04-21: qty 180, 90d supply, fill #0

## 2024-04-22 NOTE — Discharge Instructions (Signed)

## 2024-04-23 ENCOUNTER — Ambulatory Visit: Payer: Self-pay

## 2024-04-23 ENCOUNTER — Inpatient Hospital Stay
Admission: RE | Admit: 2024-04-23 | Discharge: 2024-04-23 | Disposition: A | Source: Ambulatory Visit | Attending: Family Medicine | Admitting: Family Medicine

## 2024-04-23 DIAGNOSIS — M545 Low back pain, unspecified: Secondary | ICD-10-CM

## 2024-04-23 DIAGNOSIS — D6861 Antiphospholipid syndrome: Secondary | ICD-10-CM | POA: Diagnosis not present

## 2024-04-23 DIAGNOSIS — Z7901 Long term (current) use of anticoagulants: Secondary | ICD-10-CM | POA: Diagnosis not present

## 2024-04-23 LAB — POCT INR: INR: 1.4 — AB (ref 2.0–3.0)

## 2024-04-23 MED ORDER — IOPAMIDOL (ISOVUE-M 200) INJECTION 41%
1.0000 mL | Freq: Once | INTRAMUSCULAR | Status: AC
  Administered 2024-04-23: 1 mL via EPIDURAL

## 2024-04-23 MED ORDER — METHYLPREDNISOLONE ACETATE 40 MG/ML INJ SUSP (RADIOLOG
80.0000 mg | Freq: Once | INTRAMUSCULAR | Status: AC
  Administered 2024-04-23: 80 mg via EPIDURAL

## 2024-04-23 NOTE — Progress Notes (Signed)
 Pt tests at home. Result was placed on [employer] website by pt. INR was 1.4 Pt is having a lumbar epidural today and has been on a lovenox  bridge. Continue Lovenox  bridge instructions and retest on 12/10.

## 2024-04-23 NOTE — Patient Instructions (Addendum)
 Pre visit review using our clinic review tool, if applicable. No additional management support is needed unless otherwise documented below in the visit note.  Continue Lovenox  bridge instructions and retest on 12/10.

## 2024-04-29 ENCOUNTER — Ambulatory Visit: Payer: Self-pay

## 2024-04-29 DIAGNOSIS — Z7901 Long term (current) use of anticoagulants: Secondary | ICD-10-CM

## 2024-04-29 LAB — POCT INR: INR: 2.7 (ref 2.0–3.0)

## 2024-04-29 NOTE — Progress Notes (Signed)
 Pt tests at home. Result was placed on [employer] website by pt. INR was 2.7 Pt had a lumbar epidural on 12/3 and has been on a Lovenox  bridge. Stop Lovenox  injections. Continue 1 tablet (6 mg) daily and recheck in 3 weeks, 12/30, due to holiday.  Sent mychart msg with dosing.

## 2024-04-29 NOTE — Patient Instructions (Addendum)
 Pre visit review using our clinic review tool, if applicable. No additional management support is needed unless otherwise documented below in the visit note.  Continue 1 tablet (6 mg) daily and recheck in 3 weeks, 12/30, due to holiday.

## 2024-05-01 ENCOUNTER — Other Ambulatory Visit (HOSPITAL_COMMUNITY): Payer: Self-pay

## 2024-05-05 ENCOUNTER — Other Ambulatory Visit (HOSPITAL_COMMUNITY): Payer: Self-pay

## 2024-05-20 ENCOUNTER — Ambulatory Visit (INDEPENDENT_AMBULATORY_CARE_PROVIDER_SITE_OTHER)

## 2024-05-20 DIAGNOSIS — Z7901 Long term (current) use of anticoagulants: Secondary | ICD-10-CM

## 2024-05-20 LAB — POCT INR: INR: 2.6 (ref 2.0–3.0)

## 2024-05-20 NOTE — Progress Notes (Signed)
 Pt tests at home. Result was placed on [employer] website by pt. INR was 2.6 Continue 1 tablet (6 mg) daily and recheck in 2 weeks, 1/13.  Sent mychart msg with dosing.

## 2024-05-20 NOTE — Patient Instructions (Addendum)
 Pre visit review using our clinic review tool, if applicable. No additional management support is needed unless otherwise documented below in the visit note.  Continue 1 tablet (6 mg) daily and recheck in 2 weeks, 1/13.

## 2024-05-20 NOTE — Progress Notes (Deleted)
 " [facility] [address] [place] [address] Phone: [phone] Subjective:    I'm seeing this patient by the request  of:  Jodie Lavern CROME, MD  CC:   YEP:Dlagzrupcz  02/07/2024 Patient's ferritin was significantly lower at 19 again.  Will send patient for transfusion that could be contributing to some of the fatigue at this time.     Patient did not have significant improvement with the epidural but initially it was pain-free so we may need to consider the possibility of repeat that at this point try to piriformis in the office. Will see if this is something that was a little more causing the radicular symptoms. We discussed with patient to continue the other meds. Does have more of the worsening of iron  deficiency so we will we should consider the possibility of a transfusion. We discussed icing regimen otherwise. Follow-up with me again in 2 months.   Update 05/21/2024 Jaremy Nosal is a 58 y.o. male coming in with complaint of R hip and lumbar spine pain. NR injection 04/23/2024. Patient states      Past Medical History:  Diagnosis Date   Anticoagulated on Coumadin     Antiphospholipid antibody positive, followed by Dr. Onesimo, Hematology 06/27/2016   Anxiety with depression 06/24/2014   Chronic back pain    ED (erectile dysfunction)    GERD (gastroesophageal reflux disease)    Hemorrhoids, internal, with bleeding    Hiatal hernia    History of adenomatous polyp of colon, 07/08/16, tubular adenomas    History of atrial fibrillation, episode 08/2016    History of cardiomyopathy (HCC) 08/04/2018   ECHO 12/02/18  1. The left ventricle has normal systolic function with an ejection fraction of 60-65%. The cavity size was normal. Left ventricular diastolic parameters were normal.  2. The right ventricle has normal systolic function. The cavity was normal. There is no increase in right ventricular wall thickness.  3. The mitral valve is grossly normal.  4.  The aortic valve is tricuspid. 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 Could increase thrombotic risk especially if he has a polycythemic response.  If he decided to pursue this with an understanding of the risk - would use lowest dose of testosterone  to maintain level low normal 300-400.  2020: Secondary polycythemia. Not a candidate.   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[facility]), sober since 04/2016    S/P insertion of iliac artery stent 2018   02-23-2017  right CIA PTA and stent/05-11-2016 in-stent restenosis s/p thrombectomy/2018 thrombectomy and  patch angioplasty right femoral artery    Past Surgical History:  Procedure Laterality Date   ABDOMINAL AORTOGRAM W/LOWER EXTREMITY N/A 10/19/2022   Procedure: ABDOMINAL AORTOGRAM W/LOWER EXTREMITY;  Surgeon: Gretta Lonni PARAS, MD;  Location: [facility] INVASIVE CV LAB;  Service: Cardiovascular;  Laterality: N/A;   ANGIOPLASTY ILLIAC ARTERY Right 07/17/2016  Procedure: ANGIOPLASTY RIGHT COMMON ILIAC ARTERY;  Surgeon: Redell LITTIE Door, MD;  Location: [facility] OR;  Service: Vascular;  Laterality: Right;   ANTERIOR CERVICAL DECOMP/DISCECTOMY FUSION  07/21/2010   C5 -- C7   CARDIOVASCULAR STRESS TEST  06-14-2016   dr hilty   normal perfusion study w/ no reversible ischemia/  stress ef 44% but visually looks better , echo ordered (LVEF 30-44%)  , normal LV wall motion    COLONOSCOPY N/A  07/08/2016   Procedure: COLONOSCOPY;  Surgeon: Oliva Boots, MD;  Location: [facility] ENDOSCOPY;  Service: Endoscopy;  Laterality: N/A;   ESOPHAGOGASTRODUODENOSCOPY N/A 07/08/2016   Procedure: ESOPHAGOGASTRODUODENOSCOPY (EGD);  Surgeon: Oliva Boots, MD;  Location: [facility];  Service: Endoscopy;  Laterality: N/A;   GROIN DEBRIDEMENT Right 07/25/2016   Procedure: EVACUATION HEMATOMA RIGHT GROIN;  Surgeon: Penne Lonni Colorado, MD;  Location: [facility] OR;  Service: Vascular;  Laterality: Right;   HEMORRHOID SURGERY N/A 12/07/2016   Procedure: 3 COLUMN HEMORRHOIDECTOMY;  Surgeon: Debby Hila, MD;  Location: [facility];  Service: General;  Laterality: N/A;   INTRAOPERATIVE ARTERIOGRAM Right 07/17/2016   Procedure: INTRA OPERATIVE ANGIOGRAM OF RIGHT COMMON ILIAC ARTERY;  Surgeon: Redell LITTIE Door, MD;  Location: [facility] OR;  Service: Vascular;  Laterality: Right;   KNEE SURGERY Right 1983   cartilage   PATCH ANGIOPLASTY Right 07/17/2016   Procedure: PATCH ANGIOPLASTY RIGHT FEMORAL ARTERY USING GEORGE BIOLOGIC PATCH;  Surgeon: Redell LITTIE Door, MD;  Location: [facility] OR;  Service: Vascular;  Laterality: Right;   PERIPHERAL VASCULAR BALLOON ANGIOPLASTY  10/19/2022   Procedure: PERIPHERAL VASCULAR BALLOON ANGIOPLASTY;  Surgeon: Gretta Lonni PARAS, MD;  Location: [facility] INVASIVE CV LAB;  Service: Cardiovascular;;   PERIPHERAL VASCULAR CATHETERIZATION N/A 02/24/2016   Procedure: Abdominal Aortogram w/Lower Extremity;  Surgeon: Redell LITTIE Door, MD;  Location: [facility] INVASIVE CV LAB;  Service: Cardiovascular;  Laterality: N/A;   PERIPHERAL VASCULAR CATHETERIZATION Right 02/24/2016   Procedure: Peripheral Vascular Intervention;  Surgeon: Redell LITTIE Door, MD;  Location: [facility] INVASIVE CV LAB;  Service: Cardiovascular;  Laterality: Right;  Common iliac   PERIPHERAL VASCULAR CATHETERIZATION N/A 05/11/2016   Procedure: Abdominal Aortogram;  Surgeon: Redell LITTIE Door, MD;  Location: [facility] INVASIVE CV LAB;  Service: Cardiovascular;  Laterality: N/A;    PERIPHERAL VASCULAR CATHETERIZATION N/A 05/11/2016   Procedure: Lower Extremity Angiography;  Surgeon: Redell LITTIE Door, MD;  Location: [facility] INVASIVE CV LAB;  Service: Cardiovascular;  Laterality: N/A;   POSTERIOR CERVICAL FUSION/FORAMINOTOMY N/A 07/05/2018   Procedure: Posterior cervical fusion with lateral mass fixation - Cervical five-Cervical six, Cervical six-Cervical seven;  Surgeon: Louis Shove, MD;  Location: [facility] OR;  Service: Neurosurgery;  Laterality: N/A;   THROMBECTOMY FEMORAL ARTERY Right 07/17/2016   Procedure: THROMBECTOMY RIGHT FEMORAL ARTERY;  Surgeon: Redell LITTIE Door, MD;  Location: [facility] OR;  Service: Vascular;  Laterality: Right;   TRANSTHORACIC ECHOCARDIOGRAM  06-20-2016   dr hilty   EF 55-60%,  grade 1 diastolic dysfunction, mild TR   Social History   Socioeconomic History   Marital status: Married    Spouse name: Mliss   Number of children: Not on file   Years of education: Not on file   Highest education level: Not on file  Occupational History   Occupation: Disabled  Tobacco Use   Smoking status: Every Day    Current packs/day: 1.00    Average packs/day: 1 pack/day for 42.0 years (42.0 ttl pk-yrs)    Types: Cigarettes, E-cigarettes   Smokeless tobacco: Never  Vaping Use  Vaping status: Never Used  Substance and Sexual Activity   Alcohol use: No    Comment: Sober since 2017   Drug use: No   Sexual activity: Yes    Partners: Female  Other Topics Concern   Not on file  Social History Narrative   Not on file   Social Drivers of Health   Tobacco Use: High Risk (02/07/2024)   Patient History    Smoking Tobacco Use: Every Day    Smokeless Tobacco Use: Never    Passive Exposure: Not on file  Financial Resource Strain: Low Risk (01/26/2024)   Overall Financial Resource Strain (CARDIA)    Difficulty of Paying Living Expenses: Not very hard  Food Insecurity: No Food Insecurity (01/26/2024)   Epic    Worried About Radiation Protection Practitioner of Food in the Last Year: Never true    Ran  Out of Food in the Last Year: Never true  Transportation Needs: No Transportation Needs (01/26/2024)   Epic    Lack of Transportation (Medical): No    Lack of Transportation (Non-Medical): No  Physical Activity: Unknown (01/26/2024)   Exercise Vital Sign    Days of Exercise per Week: 3 days    Minutes of Exercise per Session: Not on file  Stress: Stress Concern Present (01/26/2024)   [employer] of Occupational Health - Occupational Stress Questionnaire    Feeling of Stress: To some extent  Social Connections: Unknown (01/26/2024)   Social Connection and Isolation Panel    Frequency of Communication with Friends and Family: Patient declined    Frequency of Social Gatherings with Friends and Family: Patient declined    Attends Religious Services: Patient declined    Database Administrator or Organizations: No    Attends Engineer, Structural: Not on file    Marital Status: Married  Depression (PHQ2-9): Low Risk (01/28/2024)   Depression (PHQ2-9)    PHQ-2 Score: 0  Alcohol Screen: Not on file  Housing: Unknown (01/26/2024)   Epic    Unable to Pay for Housing in the Last Year: No    Number of Times Moved in the Last Year: Not on file    Homeless in the Last Year: No  Utilities: Not on file  Health Literacy: Not on file   Allergies[1] Family History  Problem Relation Age of Onset   Lung cancer Father     Current Outpatient Medications (Cardiovascular):    carvedilol  (COREG ) 6.25 MG tablet, Take 1 tablet (6.25 mg total) by mouth 2 (two) times daily.   rosuvastatin  (CRESTOR ) 10 MG tablet, Take 1 tablet (10 mg total) by mouth daily.  Current Outpatient Medications (Respiratory):    Fluticasone -Umeclidin-Vilant (TRELEGY ELLIPTA ) 100-62.5-25 MCG/ACT AEPB, Inhale 1 puff into the lungs daily.   triamcinolone  (NASACORT  ALLERGY 24HR) 55 MCG/ACT AERO nasal inhaler, Place 1-2 sprays into the nose daily.  Current Outpatient Medications (Hematological):    enoxaparin  (LOVENOX ) 120  MG/0.8ML injection, Inject 0.8 mLs (120 mg total) into the skin daily. USE AS DIRECTED BY ANTICOAGULATION CLINIC   warfarin (COUMADIN ) 6 MG tablet, TAKE 1 TABLET BY MOUTH DAILY OR AS DIRECTED BY ANTICOAGULATION CLINIC  Current Outpatient Medications (Other):    Cholecalciferol (VITAMIN D3) 50 MCG CAPS, Take 2,000 Units by mouth daily at 12 noon.   cyclobenzaprine  (FLEXERIL ) 10 MG tablet, Take 1 tablet (10 mg total) by mouth at bedtime as needed for muscle spasms.   esomeprazole (NEXIUM) 20 MG capsule, Take 20 mg by mouth daily before lunch.   FLUoxetine  (  PROZAC ) 20 MG capsule, Take 1 capsule (20 mg total) by mouth daily.   gabapentin  (NEURONTIN ) 100 MG capsule, Take 2 capsules (200 mg total) by mouth at bedtime.   Reviewed prior external information including notes and imaging from  primary care provider As well as notes that were available from care everywhere and other healthcare systems.  Past medical history, social, surgical and family history all reviewed in electronic medical record.  No pertanent information unless stated regarding to the chief complaint.   Review of Systems:  No headache, visual changes, nausea, vomiting, diarrhea, constipation, dizziness, abdominal pain, skin rash, fevers, chills, night sweats, weight loss, swollen lymph nodes, body aches, joint swelling, chest pain, shortness of breath, mood changes. POSITIVE muscle aches  Objective  There were no vitals taken for this visit.   General: No apparent distress alert and oriented x3 mood and affect normal, dressed appropriately.  HEENT: Pupils equal, extraocular movements intact  Respiratory: Patient's speak in full sentences and does not appear short of breath  Cardiovascular: No lower extremity edema, non tender, no erythema      Impression and Recommendations:           [1]  Allergies Allergen Reactions   Cymbalta  [Duloxetine  Hcl] Other (See Comments)    Headaches   "

## 2024-05-21 ENCOUNTER — Ambulatory Visit: Admitting: Family Medicine

## 2024-06-03 ENCOUNTER — Ambulatory Visit (INDEPENDENT_AMBULATORY_CARE_PROVIDER_SITE_OTHER)

## 2024-06-03 DIAGNOSIS — Z7901 Long term (current) use of anticoagulants: Secondary | ICD-10-CM

## 2024-06-03 LAB — POCT INR: INR: 2.7 (ref 2.0–3.0)

## 2024-06-03 NOTE — Patient Instructions (Addendum)
 Pre visit review using our clinic review tool, if applicable. No additional management support is needed unless otherwise documented below in the visit note.  Continue 1 tablet (6 mg) daily and recheck in 2 weeks, 1/27.

## 2024-06-03 NOTE — Progress Notes (Signed)
 Pt tests at home. Result was placed on [employer] website by pt. INR was 2.7 Continue 1 tablet (6 mg) daily and recheck in 2 weeks, 1/27.  Sent mychart msg with dosing.

## 2024-06-17 ENCOUNTER — Ambulatory Visit: Payer: Self-pay

## 2024-06-17 DIAGNOSIS — Z7901 Long term (current) use of anticoagulants: Secondary | ICD-10-CM

## 2024-06-17 LAB — POCT INR: INR: 2.6 (ref 2.0–3.0)

## 2024-06-17 NOTE — Progress Notes (Signed)
 Pt tests at home. Result was placed on [employer] website by pt. INR was 2.6 Continue 1 tablet (6 mg) daily and recheck in 2 weeks, 2/10.  Sent mychart msg with dosing.

## 2024-06-17 NOTE — Patient Instructions (Addendum)
 Pre visit review using our clinic review tool, if applicable. No additional management support is needed unless otherwise documented below in the visit note.  Continue 1 tablet (6 mg) daily and recheck in 2 weeks, 2/10.

## 2024-06-25 ENCOUNTER — Other Ambulatory Visit: Payer: Self-pay

## 2024-08-29 ENCOUNTER — Ambulatory Visit (HOSPITAL_COMMUNITY)

## 2024-09-10 ENCOUNTER — Ambulatory Visit: Admitting: Cardiology

## 2024-12-09 ENCOUNTER — Ambulatory Visit (HOSPITAL_COMMUNITY)

## 2024-12-09 ENCOUNTER — Ambulatory Visit: Admitting: Vascular Surgery
